# Patient Record
Sex: Female | Born: 1955
Health system: Southern US, Community
[De-identification: ages and names within clinical notes are randomized; demographics above are authoritative.]

## PROBLEM LIST (undated history)

## (undated) DIAGNOSIS — K829 Disease of gallbladder, unspecified: Secondary | ICD-10-CM

## (undated) DIAGNOSIS — F32A Depression, unspecified: Secondary | ICD-10-CM

## (undated) DIAGNOSIS — N83209 Unspecified ovarian cyst, unspecified side: Secondary | ICD-10-CM

## (undated) DIAGNOSIS — G309 Alzheimer's disease, unspecified: Secondary | ICD-10-CM

## (undated) DIAGNOSIS — N83201 Unspecified ovarian cyst, right side: Secondary | ICD-10-CM

## (undated) DIAGNOSIS — IMO0001 Reserved for inherently not codable concepts without codable children: Secondary | ICD-10-CM

## (undated) DIAGNOSIS — I639 Cerebral infarction, unspecified: Secondary | ICD-10-CM

## (undated) DIAGNOSIS — I10 Essential (primary) hypertension: Secondary | ICD-10-CM

## (undated) DIAGNOSIS — F419 Anxiety disorder, unspecified: Secondary | ICD-10-CM

## (undated) DIAGNOSIS — J189 Pneumonia, unspecified organism: Secondary | ICD-10-CM

## (undated) DIAGNOSIS — K760 Fatty (change of) liver, not elsewhere classified: Secondary | ICD-10-CM

## (undated) DIAGNOSIS — K469 Unspecified abdominal hernia without obstruction or gangrene: Secondary | ICD-10-CM

## (undated) DIAGNOSIS — F0280 Dementia in other diseases classified elsewhere without behavioral disturbance: Secondary | ICD-10-CM

## (undated) DIAGNOSIS — I2510 Atherosclerotic heart disease of native coronary artery without angina pectoris: Secondary | ICD-10-CM

## (undated) DIAGNOSIS — F329 Major depressive disorder, single episode, unspecified: Secondary | ICD-10-CM

## (undated) DIAGNOSIS — K769 Liver disease, unspecified: Secondary | ICD-10-CM

## (undated) DIAGNOSIS — J449 Chronic obstructive pulmonary disease, unspecified: Secondary | ICD-10-CM

## (undated) HISTORY — PX: UMBILICAL HERNIA REPAIR: SHX196

## (undated) HISTORY — DX: Chronic obstructive pulmonary disease, unspecified: J44.9

## (undated) HISTORY — DX: Major depressive disorder, single episode, unspecified: F32.9

## (undated) HISTORY — DX: Cerebral infarction, unspecified: I63.9

## (undated) HISTORY — DX: Pneumonia, unspecified organism: J18.9

## (undated) HISTORY — DX: Liver disease, unspecified: K76.9

## (undated) HISTORY — DX: Disease of gallbladder, unspecified: K82.9

## (undated) HISTORY — PX: ABDOMINAL HYSTERECTOMY: SHX81

## (undated) HISTORY — DX: Anxiety disorder, unspecified: F41.9

## (undated) HISTORY — DX: Unspecified ovarian cyst, right side: N83.201

## (undated) HISTORY — PX: OVARIAN CYST REMOVAL: SHX89

## (undated) HISTORY — DX: Fatty (change of) liver, not elsewhere classified: K76.0

## (undated) HISTORY — PX: INGUINAL HERNIA REPAIR: SUR1180

## (undated) HISTORY — DX: Unspecified ovarian cyst, unspecified side: N83.209

## (undated) HISTORY — DX: Depression, unspecified: F32.A

## (undated) HISTORY — PX: CHOLECYSTECTOMY: SHX55

---
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
## 1998-11-04 ENCOUNTER — Ambulatory Visit (HOSPITAL_COMMUNITY): Admission: RE | Admit: 1998-11-04 | Discharge: 1998-11-04 | Payer: Self-pay | Admitting: Obstetrics & Gynecology

## 1998-11-04 ENCOUNTER — Encounter: Payer: Self-pay | Admitting: Obstetrics & Gynecology

## 1998-12-04 ENCOUNTER — Ambulatory Visit (HOSPITAL_COMMUNITY): Admission: RE | Admit: 1998-12-04 | Discharge: 1998-12-04 | Payer: Self-pay | Admitting: Gastroenterology

## 1998-12-04 ENCOUNTER — Encounter: Payer: Self-pay | Admitting: Gastroenterology

## 2000-05-13 ENCOUNTER — Encounter: Payer: Self-pay | Admitting: Emergency Medicine

## 2000-05-13 ENCOUNTER — Emergency Department (HOSPITAL_COMMUNITY): Admission: EM | Admit: 2000-05-13 | Discharge: 2000-05-13 | Payer: Self-pay | Admitting: Emergency Medicine

## 2000-10-25 ENCOUNTER — Encounter: Admission: RE | Admit: 2000-10-25 | Discharge: 2000-10-25 | Payer: Self-pay | Admitting: *Deleted

## 2000-10-25 ENCOUNTER — Encounter: Payer: Self-pay | Admitting: *Deleted

## 2002-09-29 ENCOUNTER — Encounter: Payer: Self-pay | Admitting: Emergency Medicine

## 2002-09-29 ENCOUNTER — Emergency Department (HOSPITAL_COMMUNITY): Admission: EM | Admit: 2002-09-29 | Discharge: 2002-09-29 | Payer: Self-pay | Admitting: Emergency Medicine

## 2002-10-03 ENCOUNTER — Encounter (HOSPITAL_BASED_OUTPATIENT_CLINIC_OR_DEPARTMENT_OTHER): Payer: Self-pay | Admitting: General Surgery

## 2002-10-04 ENCOUNTER — Encounter (HOSPITAL_BASED_OUTPATIENT_CLINIC_OR_DEPARTMENT_OTHER): Payer: Self-pay | Admitting: General Surgery

## 2002-10-04 ENCOUNTER — Ambulatory Visit (HOSPITAL_COMMUNITY): Admission: RE | Admit: 2002-10-04 | Discharge: 2002-10-05 | Payer: Self-pay | Admitting: General Surgery

## 2002-10-04 ENCOUNTER — Encounter (INDEPENDENT_AMBULATORY_CARE_PROVIDER_SITE_OTHER): Payer: Self-pay

## 2005-03-21 DIAGNOSIS — IMO0001 Reserved for inherently not codable concepts without codable children: Secondary | ICD-10-CM

## 2005-03-21 HISTORY — DX: Reserved for inherently not codable concepts without codable children: IMO0001

## 2005-09-23 ENCOUNTER — Emergency Department (HOSPITAL_COMMUNITY): Admission: EM | Admit: 2005-09-23 | Discharge: 2005-09-23 | Payer: Self-pay | Admitting: Emergency Medicine

## 2005-09-29 ENCOUNTER — Encounter: Admission: RE | Admit: 2005-09-29 | Discharge: 2005-09-29 | Payer: Self-pay | Admitting: Cardiovascular Disease

## 2005-10-18 ENCOUNTER — Encounter (HOSPITAL_COMMUNITY): Admission: RE | Admit: 2005-10-18 | Discharge: 2005-12-01 | Payer: Self-pay | Admitting: Cardiovascular Disease

## 2007-02-12 ENCOUNTER — Inpatient Hospital Stay (HOSPITAL_BASED_OUTPATIENT_CLINIC_OR_DEPARTMENT_OTHER): Admission: RE | Admit: 2007-02-12 | Discharge: 2007-02-12 | Payer: Self-pay | Admitting: Cardiology

## 2009-06-22 ENCOUNTER — Ambulatory Visit: Payer: Self-pay | Admitting: Occupational Medicine

## 2009-06-22 DIAGNOSIS — R0602 Shortness of breath: Secondary | ICD-10-CM | POA: Insufficient documentation

## 2009-06-24 ENCOUNTER — Ambulatory Visit: Payer: Self-pay | Admitting: Family Medicine

## 2009-06-24 DIAGNOSIS — I10 Essential (primary) hypertension: Secondary | ICD-10-CM | POA: Insufficient documentation

## 2009-06-24 DIAGNOSIS — J209 Acute bronchitis, unspecified: Secondary | ICD-10-CM | POA: Insufficient documentation

## 2009-06-25 ENCOUNTER — Encounter: Admission: RE | Admit: 2009-06-25 | Discharge: 2009-06-25 | Payer: Self-pay | Admitting: Cardiovascular Disease

## 2009-06-26 ENCOUNTER — Emergency Department (HOSPITAL_COMMUNITY): Admission: EM | Admit: 2009-06-26 | Discharge: 2009-06-26 | Payer: Self-pay | Admitting: Emergency Medicine

## 2009-07-01 ENCOUNTER — Ambulatory Visit: Payer: Self-pay | Admitting: Family Medicine

## 2009-07-01 DIAGNOSIS — F17200 Nicotine dependence, unspecified, uncomplicated: Secondary | ICD-10-CM | POA: Insufficient documentation

## 2009-07-06 ENCOUNTER — Encounter: Payer: Self-pay | Admitting: Family Medicine

## 2009-08-19 ENCOUNTER — Inpatient Hospital Stay (HOSPITAL_COMMUNITY)
Admission: RE | Admit: 2009-08-19 | Discharge: 2009-08-22 | Payer: Self-pay | Source: Home / Self Care | Admitting: General Surgery

## 2009-09-02 ENCOUNTER — Encounter: Payer: Self-pay | Admitting: Family Medicine

## 2010-04-22 NOTE — Assessment & Plan Note (Signed)
Summary: POSSIBLE BRONCHITIS/FEVER  FOR 3 DAYS   Vital Signs:  Patient Profile:   55 Years Old Female CC:      Cough, fever x 3 days Height:     62 inches Weight:      132 pounds O2 Sat:      95 % O2 treatment:    Room Air Temp:     97.4 degrees F oral Pulse rate:   79 / minute Pulse rhythm:   regular Resp:     12 per minute BP sitting:   148 / 87  (right arm) Cuff size:   regular  Vitals Entered By: Emilio Math (June 22, 2009 9:16 AM)                  Current Allergies: ! AMOXICILLIN ! SULFAHistory of Present Illness Chief Complaint: Cough, fever x 3 days History of Present Illness: Presents with complaints of dry cough for the last 3 days.   Much difficulty sleeping due to cough.   Notes some wheezing and chest tightness as well.   Smokes 1ppd for the last 35 years.  Says she has felt warm and might have had a fever.   No sinus congestion.   No post nasal drip.  No ear pain.  No sore throat.   Denies history of asthma or COPD.    REVIEW OF SYSTEMS Constitutional Symptoms      Denies fever, chills, night sweats, weight loss, weight gain, and fatigue.  Eyes       Denies change in vision, eye pain, eye discharge, glasses, contact lenses, and eye surgery. Ear/Nose/Throat/Mouth       Denies hearing loss/aids, change in hearing, ear pain, ear discharge, dizziness, frequent runny nose, frequent nose bleeds, sinus problems, sore throat, hoarseness, and tooth pain or bleeding.  Respiratory       Complains of dry cough, wheezing, and shortness of breath.      Denies productive cough, asthma, bronchitis, and emphysema/COPD.  Cardiovascular       Denies murmurs, chest pain, and tires easily with exhertion.    Gastrointestinal       Denies stomach pain, nausea/vomiting, diarrhea, constipation, blood in bowel movements, and indigestion. Genitourniary       Denies painful urination, kidney stones, and loss of urinary control. Neurological       Denies paralysis, seizures, and  fainting/blackouts. Musculoskeletal       Denies muscle pain, joint pain, joint stiffness, decreased range of motion, redness, swelling, muscle weakness, and gout.  Skin       Denies bruising, unusual mles/lumps or sores, and hair/skin or nail changes.  Psych       Denies mood changes, temper/anger issues, anxiety/stress, speech problems, depression, and sleep problems.  Past History:  Past Medical History: Unremarkable  Past Surgical History: Caesarean section Cholecystectomy GYN surgery Hysterectomy  Family History: Mother, Breast CA, stroke, COPD, Alzheimers Father,D, 4th MI  Social History: 1 ppd, 32 yrs ETOH-yes No Drugs Disability Physical Exam General appearance: well developed, well nourished, no acute distress Ears: normal, no lesions or deformities Nasal: mucosa pink, nonedematous, no septal deviation, turbinates normal Oral/Pharynx: tongue normal, posterior pharynx without erythema or exudate Neck: neck supple,  trachea midline, no masses Chest/Lungs: scattered wheezes throughout all lobes Heart: regular rate and  rhythm, no murmur Abdomen: soft, non-tender without obvious organomegaly Assessment New Problems: 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  Max 12 puffs/day  #1 MDI x 0, 06/22/2009, Kathrine Haddock MD PREDNISONE 10 MG TABS (PREDNISONE) 2 PO BID for 2 days, then 1 BID for 2 days, then 1 daily for 2 days.  Take PC  #14 x 0, 06/22/2009, Kathrine Haddock MD  New Orders: New Patient Level III [phone] Planning Comments:   Planning on quitting smoking Nebulizer combivent given in the clinic Pro air as needed Follow up with Dr. Linford Arnold or Bowen in the next week.      The patient and/or caregiver has been counseled thoroughly with regard to medications prescribed including dosage, schedule, interactions, rationale for use, and possible  side effects and they verbalize understanding.  Diagnoses and expected course of recovery discussed and will return if not improved as expected or if the condition worsens. Patient and/or caregiver verbalized understanding.  Prescriptions: PROAIR HFA 108 (90 BASE) MCG/ACT AERS (ALBUTEROL SULFATE) Two inhalations q4-6hr as needed.  Max 12 puffs/day  #1 MDI x 0   Entered and Authorized by:   Kathrine Haddock MD   Signed by:   Kathrine Haddock MD on 06/22/2009   Method used:   Print then Give to Patient   RxID:   [id number] PREDNISONE 10 MG TABS (PREDNISONE) 2 PO BID for 2 days, then 1 BID for 2 days, then 1 daily for 2 days.  Take PC  #14 x 0   Entered and Authorized by:   Kathrine Haddock MD   Signed by:   Kathrine Haddock MD on 06/22/2009   Method used:   Print then Give to Patient   RxID:   [id number]   Appended Document: POSSIBLE BRONCHITIS/FEVER  FOR 3 DAYS Medication Administration: Ipratropium Bromide 0.5 #[id number] Apr 2011 M25 [employer]  Albuterol Silfate 2.5 mg #[id number] Aug 2012 M30 [employer]

## 2010-04-22 NOTE — Assessment & Plan Note (Signed)
Summary: NOV: Dyspnea, cough, smoker   Vital Signs:  Patient profile:   55 year old female Height:      62.5 inches Weight:      124 pounds BMI:     22.40 O2 Sat:      95 % on Room air Temp:     98.5 degrees F oral Pulse rate:   91 / minute BP sitting:   151 / 94  (left arm) Cuff size:   regular  Vitals Entered By: [name] (June 24, 2009 10:37 AM)  O2 Flow:  Room air CC: Np- seen in UC dx with possible COPD and told to f/u with you. Pt states doesn't feel any better still gets SOB- on prednisone 10mg  and ProAir inhaler Is Patient Diabetic? No   CC:  Np- seen in UC dx with possible COPD and told to f/u with you. Pt states doesn't feel any better still gets SOB- on prednisone 10mg  and ProAir inhaler.  History of Present Illness: Np- seen in UC dx with possible COPD and told to f/u with you. Pt states doesn't feel any better still gets SOB- on prednisone 10mg  and ProAir inhaler.Brittany [name] Has had a cough for several months. Thinks has been exposed to mold.  Cough started getting worse the last few days. She is current smoker.  Would get bronchitis yearly.  Put on the prednisone 10mg f or 5 days. Feels like having fever and chills.  Having coughing fits.  Feels the proair 2 puffs is helping some.  Did receive a breathing treatment.    Does take a blood pressure pill but can't remember the name of it.   Habits & Providers  Alcohol-Tobacco-Diet     Alcohol drinks/day: <1     Tobacco Status: current     Cigarette Packs/Day: 1.0     Year Started: 35 years ago  Exercise-Depression-Behavior     Does Patient Exercise: no     Have you felt down or hopeless? no     STD Risk: never     Drug Use: never     Seat Belt Use: always  Current Medications (verified): 1)  Advil 200 Mg Tabs (Ibuprofen) .... 2 Prn 2)  Prednisone 10 Mg Tabs (Prednisone) .... 2 Po Bid For 2 Days, Then 1 Bid For 2 Days, Then 1 Daily For 2 Days.  Take Pc 3)  Proair Hfa 108 (90 Base) Mcg/act Aers (Albuterol Sulfate) ....  Two Inhalations Q4-6hr As Needed.  Max 12 Puffs/day 4)  Nitrostat 0.3 Mg Subl (Nitroglycerin)  Allergies (verified): 1)  ! Amoxicillin 2)  ! Sulfa  Comments:  Nurse/Medical Assistant: The patient's medications and allergies were reviewed with the patient and were updated in the Medication and Allergy Lists. [name] (June 24, 2009 10:40 AM)  Past History:  Past Surgical History: Caesarean section 1991, 1992  Curretage for endometriosis fin 1993, 94, 95, 96. Cholecystectomy 2004 GYN surgery Hysterectomy Ovarin cyst 1997 Hernia  Social History: Disabled.  1 year of college. Lives with her husband and daughter adn grandchild.  1 ppd, 32 yrs ETOH-yes No Drugs DisabilitySmoking Status:  current Packs/Day:  1.0 Does Patient Exercise:  no STD Risk:  never Drug Use:  never Seat Belt Use:  always  Review of Systems       No fever/sweats. + weakness, no unexplained weight loss/gain.  No vison changes.  No difficulty hearing/ringing in ears, hay fever/allergies.  + chest pain/discomfort, no palpitations.  No Br lump/nipple discharge.  +  cough/wheeze.  No blood in BM, nausea/vomiting/diarrhea.  No nighttime urination, leaking urine, unusual vaginal bleeding, discharge (penis or vagina).  No muscle/joint pain. No rash, change in mole.  No HA, memory loss.  No anxiety, sleep d/o, depression.  No easy bruising/bleeding, unexplained lump   Physical Exam  General:  Well-developed,well-nourished,in no acute distress; alert,appropriate and cooperative throughout examination Head:  Normocephalic and atraumatic without obvious abnormalities. No apparent alopecia or balding. Eyes:  No corneal or conjunctival inflammation noted. EOMI. Perrla.  Ears:  External ear exam shows no significant lesions or deformities.  Otoscopic examination reveals clear canals, tympanic membranes are intact bilaterally without bulging, retraction, inflammation or discharge. Hearing is grossly normal  bilaterally. Nose:  External nasal examination shows no deformity or inflammation. Mouth:  Oral mucosa and oropharynx without lesions or exudates.  Teeth in good repair. Neck:  No deformities, masses, or tenderness noted. No Tm.  Lungs:  Coarse breath sounds with rhonchi diffusely.  No wheezing.   Heart:  Normal rate and regular rhythm. S1 and S2 normal without gallop, murmur, click, rub or other extra sounds. Skin:  no rashes.   Cervical Nodes:  No lymphadenopathy noted Psych:  Cognition and judgment appear intact. Alert and cooperative with normal attention span and concentration. No apparent delusions, illusions, hallucinations   Impression & Recommendations:  Problem # 1:  BRONCHITIS, ACUTE (ICD-466.0) I do really suspect she has underlying COPD so will treat with doxy.Given neb here in th eoffice today and felt much better after the 5mg  of albuterol. Her lung exam improved somewhat.   She is a 1ppd smoker and will increase her proair number of puffs and have her use it consistantly three times a day Will also extend her steroid course and have her f/u in one week. If not better or gets worse will get a CXR for fuether evaluation.  Her updated medication list for this problem includes:    Proair Hfa 108 (90 Base) Mcg/act Aers (Albuterol sulfate) .Brittany [name]..Brittany [name] Two inhalations q4-6hr as needed.  max 12 puffs/day    Doxycycline Hyclate 100 Mg Caps (Doxycycline hyclate) .Brittany [name]... Take 1 tablet by mouth two times a day for 10 days  Problem # 2:  IDYSPNEA (ICD-786.05) I suspect moderate COPD.  once better will schedule her for spirometry to further evaluate her lung funciton.   Problem # 3:  HYPERTENSION, BENIGN (ICD-401.1) She is on a blood pressure medication but can't remeber the name. She has been out of it for a cuple of days but is seeing her cardiologist tomorrow and for refills.    Complete Medication List: 1)  Advil 200 Mg Tabs (Ibuprofen) .... 2 prn 2)  Prednisone 10 Mg Tabs (Prednisone) ....  4 tabs daily for 4 days, then 2 tabs daily for 4 days, then 1 tab daily for 4 days the stop. 3)  Proair Hfa 108 (90 Base) Mcg/act Aers (Albuterol sulfate) .... Two inhalations q4-6hr as needed.  max 12 puffs/day 4)  Nitrostat 0.3 Mg Subl (Nitroglycerin) 5)  Doxycycline Hyclate 100 Mg Caps (Doxycycline hyclate) .... Take 1 tablet by mouth two times a day for 10 days  Other Orders: Albuterol Sulfate Sol 1mg  unit dose (O1308) Nebulizer Tx (65784)  Patient Instructions: 1)  Complete teh antibiotic 2)  Increase your proair to 4-6 puffs every three times a day. If starts feeling better can drop back to 2-4 puffs. 3)  Follow up in one week to recheck lungs. 4)  I am going to extend your steroids for  a little longer.  Prescriptions: DOXYCYCLINE HYCLATE 100 MG CAPS (DOXYCYCLINE HYCLATE) Take 1 tablet by mouth two times a day for 10 days  #20 x 0   Entered and Authorized by:   Nani Gasser MD   Signed by:   Nani Gasser MD on 06/24/2009   Method used:   [facility]ron[facility] to        [facility] [phone]* (retail)       [address] [place], [place]  [address]       Ph: [phone] or [phone]       Fax: [phone]   RxID:   [id number] PREDNISONE 10 MG TABS (PREDNISONE) 4 tabs daily for 4 days, then 2 tabs daily for 4 days, then 1 tab daily for 4 days the stop.  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 If he doesn't want to rx then asked pt to remind me at next OV next week and we can discuss further treatments for smoking cessation.

## 2010-04-22 NOTE — Letter (Signed)
Summary: [facility]   Imported By: Lanelle Bal 09/23/2009 09:54:51  _____________________________________________________________________  External Attachment:    Type:   Image     Comment:   External Document

## 2010-04-22 NOTE — Assessment & Plan Note (Signed)
Summary: Acute bronchitis, tob abuse.    Vital Signs:  Patient profile:   55 year old female Height:      62.5 inches Weight:      126 pounds O2 Sat:      98 % on Room air Pulse rate:   90 / minute BP sitting:   135 / 83  (left arm) Cuff size:   regular  Vitals Entered By: [name] (July 01, 2009 9:02 AM)  O2 Flow:  Room air CC: followup breathing- seen in ED this past Friday for SOB and vomiting- xray showed bronchitis, Headache   Primary Care Provider:  Nani Gasser MD  CC:  followup breathing- seen in ED this past Friday for SOB and vomiting- xray showed bronchitis and Headache.  History of Present Illness: followup breathing- seen in ED this past Friday for SOB and vomiting- xray showed bronchitis.  Saw her cardiologist adn he did an xray and was told had bronchitis.  Later that night started vomiting, coughing and got SOB and then went to the ED.  Did an hour long breathing tx. Stopped the doxy and was changed to zpack. Overall she is feeling better. Would like to start Chantix.    Current Medications (verified): 1)  Advil 200 Mg Tabs (Ibuprofen) .... 2 Prn 2)  Prednisone 10 Mg Tabs (Prednisone) .... 4 Tabs Daily For 4 Days, Then 2 Tabs Daily For 4 Days, Then 1 Tab Daily For 4 Days The Stop. 3)  Proair Hfa 108 (90 Base) Mcg/act Aers (Albuterol Sulfate) .... Two Inhalations Q4-6hr As Needed.  Max 12 Puffs/day 4)  Nitrostat 0.3 Mg Subl (Nitroglycerin) 5)  Amlodipine Besylate 5 Mg Tabs (Amlodipine Besylate) .... Take One Tbaelt By Mouth Once A Day 6)  Tylenol With Codeine #3 300-30 Mg Tabs (Acetaminophen-Codeine) .... Take One Tabelt By Mouth  At Bedtime 7)  Azithromycin 250 Mg Tabs (Azithromycin) .... Take 2 Tablets By Mouth Once A Day Then 1 Tab Daily For 4 Days 8)  Benicar 20 Mg Tabs (Olmesartan Medoxomil) .... Take One Tablet By Mouth Once Ad Ay  Allergies (verified): 1)  ! Amoxicillin 2)  ! Sulfa  Comments:  Nurse/Medical Assistant: The patient's  medications and allergies were reviewed with the patient and were updated in the Medication and Allergy Lists. [name] (July 01, 2009 9:04 AM)  Physical Exam  General:  Well-developed,well-nourished,in no acute distress; alert,appropriate and cooperative throughout examination Head:  Normocephalic and atraumatic without obvious abnormalities. No apparent alopecia or balding. Eyes:  No corneal or conjunctival inflammation noted. EOMI. Perrla. Lungs:  Normal respiratory effort, chest expands symmetrically. Lungs are clear to auscultation, no crackles or wheezes. Heart:  Normal rate and regular rhythm. S1 and S2 normal without gallop, murmur, click, rub or other extra sounds.   Impression & Recommendations:  Problem # 1:  BRONCHITIS, ACUTE (ICD-466.0) Has completed the zpack. Doing much better overall. Using proair about twice a day.    Still recommend f/u for spirometry in a few weeks.  WAs given aerochamber.  The following medications were removed from the medication list:    Doxycycline Hyclate 100 Mg Caps (Doxycycline hyclate) .Marland Kitchen... Take 1 tablet by mouth two times a day for 10 days Her updated medication list for this problem includes:    Proair Hfa 108 (90 Base) Mcg/act Aers (Albuterol sulfate) .Marland Kitchen..Marland Kitchen Two inhalations q4-6hr as needed.  max 12 puffs/day    Azithromycin 250 Mg Tabs (Azithromycin) .Marland Kitchen... Take 2 tablets by mouth once a day then  1 tab daily for 4 days  Problem # 2:  TOBACCO ABUSE (ICD-305.1) Discussed medication s.e and potential complication. Dsicussed how it works.  F/U in 3-4 weeks to make sure doing well.  Her updated medication list for this problem includes:    Chantix Starting Month Pak 0.5 Mg X 11 & 1 Mg X 42 Tabs (Varenicline tartrate) .Marland Kitchen... Take as directed.  Complete Medication List: 1)  Advil 200 Mg Tabs (Ibuprofen) .... 2 prn 2)  Prednisone 10 Mg Tabs (Prednisone) .... 4 tabs daily for 4 days, then 2 tabs daily for 4 days, then 1 tab daily for 4 days the  stop. 3)  Proair Hfa 108 (90 Base) Mcg/act Aers (Albuterol sulfate) .... Two inhalations q4-6hr as needed.  max 12 puffs/day 4)  Nitrostat 0.3 Mg Subl (Nitroglycerin) 5)  Amlodipine Besylate 5 Mg Tabs (Amlodipine besylate) .... Take one tbaelt by mouth once a day 6)  Tylenol With Codeine #3 300-30 Mg Tabs (Acetaminophen-codeine) .... Take one tabelt by mouth  at bedtime 7)  Azithromycin 250 Mg Tabs (Azithromycin) .... Take 2 tablets by mouth once a day then 1 tab daily for 4 days 8)  Benicar 20 Mg Tabs (Olmesartan medoxomil) .... Take one tablet by mouth once ad ay 9)  Chantix Starting Month Pak 0.5 Mg X 11 & 1 Mg X 42 Tabs (Varenicline tartrate) .... Take as directed.  Patient Instructions: 1)  Please schedule spirometry when better. Hold your Proair for about 2 days before the test.  Prescriptions: CHANTIX STARTING MONTH PAK 0.5 MG X 11 & 1 MG X 42 TABS (VARENICLINE TARTRATE) Take as directed.  #1 pack x 0   Entered and Authorized by:   Nani Gasser MD   Signed by:   Nani Gasser MD on 07/01/2009   Method used:   Electron[facility] to        [facility] [phone]* (retail)       [address]       [place], [place]  [address]       Ph: [phone] or [phone]       Fax: [phone]   RxID:   [id number]

## 2010-04-22 NOTE — Letter (Signed)
Summary: [facility]   Imported By: Lanelle Bal 07/27/2009 09:44:49  _____________________________________________________________________  External Attachment:    Type:   Image     Comment:   External Document

## 2010-05-07 ENCOUNTER — Encounter: Payer: Self-pay | Admitting: Family Medicine

## 2010-05-07 ENCOUNTER — Ambulatory Visit (INDEPENDENT_AMBULATORY_CARE_PROVIDER_SITE_OTHER): Payer: Medicare Other | Admitting: Family Medicine

## 2010-05-07 DIAGNOSIS — K5289 Other specified noninfective gastroenteritis and colitis: Secondary | ICD-10-CM | POA: Insufficient documentation

## 2010-05-12 NOTE — Assessment & Plan Note (Signed)
Summary: Gastroenteritis   Vital Signs:  Patient profile:   55 year old female Height:      62.5 inches Weight:      112 pounds Temp:     99.0 degrees F oral Pulse rate:   90 / minute BP sitting:   163 / 97  (right arm) Cuff size:   regular  Vitals Entered By: Avon Gully CMA, Duncan Dull) (May 07, 2010 1:09 PM) CC: vomiting since yesterday   Primary Care Provider:  Nani Gasser MD  CC:  vomiting since yesterday.  History of Present Illness: Started throwing up around 4AM. Has had diarrhea as well.  Today is able to take sips on gingerale.  Feles achey and tired. Husband drive.  HAd some chills.   Current Medications (verified): 1)  Nitrostat 0.3 Mg Subl (Nitroglycerin) 2)  Amlodipine Besylate 5 Mg Tabs (Amlodipine Besylate) .... Take One Tbaelt By Mouth Once A Day 3)  Tylenol With Codeine #3 300-30 Mg Tabs (Acetaminophen-Codeine) .... Take One Tabelt By Mouth  At Bedtime 4)  Benicar 20 Mg Tabs (Olmesartan Medoxomil) .... Take One Tablet By Mouth Once Ad Ay  Allergies (verified): 1)  ! Amoxicillin 2)  ! Sulfa  Comments:  Nurse/Medical Assistant: The patient's medications and allergies were reviewed with the patient and were updated in the Medication and Allergy Lists. Avon Gully CMA, Duncan Dull) (May 07, 2010 1:11 PM)  Physical Exam  General:  Well-developed,well-nourished,in no acute distress; alert,appropriate and cooperative throughout examination Mouth:  Oral mucosa and oropharynx without lesions or exudates.  Teeth in good repair. Lungs:  Normal respiratory effort, chest expands symmetrically. Lungs are clear to auscultation, no crackles or wheezes. Heart:  Normal rate and regular rhythm. S1 and S2 normal without gallop, murmur, click, rub or other extra sounds. Abdomen:  Bowel sounds positive,abdomen soft and non-tender without masses, organomegaly or hernias noted. Skin:  no rashes.   Psych:  Cognition and judgment appear intact. Alert and  cooperative with normal attention span and concentration. No apparent delusions, illusions, hallucinations   Impression & Recommendations:  Problem # 1:  GASTROENTERITIS (ICD-558.9)  Likely norovirus.  Shoulder be better in the next 24 hours. Will send over anti-nausea meds.  Give H.O. Phenergan injection given for acut enausea relief. Her husband is driving. Work on [employer].  Her updated medication list for this problem includes:    Zofran 8 Mg Tabs (Ondansetron hcl) ..... One -half to one tab sulblingually every 6 hours as needed nausea.  Orders: Promethazine up to 50mg  (J2550) Admin of Injection (IM/SQ) (98119)  Complete Medication List: 1)  Nitrostat 0.3 Mg Subl (Nitroglycerin) 2)  Amlodipine Besylate 5 Mg Tabs (Amlodipine besylate) .... Take one tbaelt by mouth once a day 3)  Tylenol With Codeine #3 300-30 Mg Tabs (Acetaminophen-codeine) .... Take one tabelt by mouth  at bedtime 4)  Benicar 20 Mg Tabs (Olmesartan medoxomil) .... Take one tablet by mouth once ad ay 5)  Zofran 8 Mg Tabs (Ondansetron hcl) .... One -half to one tab sulblingually every 6 hours as needed nausea. 6)  Promethegan 25 Mg Supp (Promethazine hcl) .... One per rectum every 6 hours as needed nausea.  Patient Instructions: 1)  Phenergan injection given for acut enausea relief. Her husband is driving.  Prescriptions: PROMETHEGAN 25 MG SUPP (PROMETHAZINE HCL) one per rectum every 6 hours as needed nausea.  #8 x 0   Entered and Authorized by:   Nani Gasser MD   Signed by:   Nani Gasser MD on  05/07/2010   Method used:   [facility]ronically to        [facility] [phone]* (retail)       [address]       [place], [place]  [address]       Ph: [phone] or [phone]       Fax: [phone]   RxID:   [id number] ZOFRAN 8 MG TABS (ONDANSETRON HCL) one -half to one tab sulblingually every 6 hours as needed nausea.  #8 x 0   Entered and Authorized by:    Nani Gasser MD   Signed by:   Nani Gasser MD on 05/07/2010   Method used:   [facility] to        [facility] [phone]* (retail)       [address]       [place], [place]  [address]       Ph: [phone] or [phone]       Fax: [phone]   RxID:   [id number]    Medication Administration  Injection # 1:    Medication: Promethazine up to 50mg     Diagnosis: GASTROENTERITIS (ICD-558.9)    Route: IM    Site: LUOQ gluteus    Exp Date: 10/20/2011    Lot #: [id number]    Mfr: [place]ward    Patient tolerated injection without complications    Given by: Avon Gully CMA, Duncan Dull) (May 07, 2010 2:08 PM)  Orders Added: 1)  Promethazine up to 50mg  [J2550] 2)  Admin of Injection (IM/SQ) [85462] 3)  Est. Patient Level III [70350]

## 2010-06-07 LAB — BASIC METABOLIC PANEL
Calcium: 8.9 mg/dL (ref 8.4–10.5)
Chloride: 107 mEq/L (ref 96–112)
GFR calc Af Amer: >60 mL/min (ref >60)
GFR calc non Af Amer: >60 mL/min (ref >60)

## 2010-06-09 LAB — BASIC METABOLIC PANEL
BUN: 12 mg/dL (ref 6–23)
Chloride: 103 mEq/L (ref 96–112)
Creatinine, Ser: 0.74 mg/dL (ref 0.4–1.2)
Glucose, Bld: 114 mg/dL — ABNORMAL HIGH (ref 70–99)
Potassium: 3.5 mEq/L (ref 3.5–5.1)

## 2010-06-09 LAB — DIFFERENTIAL
Basophils Relative: 1 % (ref 0–1)
Eosinophils Absolute: 0 10*3/uL (ref 0.0–0.7)
Monocytes Absolute: 0.9 10*3/uL (ref 0.1–1.0)
Neutro Abs: 7.2 10*3/uL (ref 1.7–7.7)
Neutrophils Relative %: 68 % (ref 43–77)

## 2010-06-09 LAB — CBC
Hemoglobin: 16.5 g/dL — ABNORMAL HIGH (ref 12.0–15.0)
MCV: 92.6 fL (ref 78.0–100.0)
Platelets: 249 10*3/uL (ref 150–400)
RBC: 5.26 MIL/uL — ABNORMAL HIGH (ref 3.87–5.11)

## 2010-08-03 NOTE — Op Note (Signed)
Brittany [name], Brittany [name]                ACCOUNT NO.:  [id number]   MEDICAL RECORD NO.:  [id number]          PATIENT TYPE:  OIB   LOCATION:  NA                           FACILITY:  [facility]   PHYSICIAN:  Mohan N. Sharyn Lull, M.D. DATE OF BIRTH:  [birth year]   DATE OF PROCEDURE:  02/12/2007  DATE OF DISCHARGE:                               OPERATIVE REPORT   PROCEDURE:  Left cardiac catheterization with selective left and right  coronary angiography, left ventriculography via right groin using  Judkins technique.   INDICATIONS FOR PROCEDURE:  [name] [name] is 55 year old white female with  past medical history significant for hypertension, hypercholesteremia,  tobacco abuse, positive family history of coronary artery disease,  chronic pain syndrome.  Complains of retrosternal chest tightness  described as squeezing pain radiating to the jaw and neck while washing  dishes.  She took sublingual nitro with relief.  She called EMS and went  to [facility] and was admitted and was advised for CT  angiogram, but the patient signed out AMA.  The patient gives also  history of exertional chest pain and dyspnea, relieved with rest.  Denies any palpitation, lightheadedness or syncope.  Denies PND,  orthopnea, leg swelling.  States had stress test in the past which was  negative.   PAST MEDICAL HISTORY:  As above.   PAST SURGICAL HISTORY:  She had C-section in the past, ovarian cyst  resection in the past, had cholecystectomy in the past and endometrial  surgery in the past.   ALLERGIES:  SHE IS ALLERGIC TO SULFA AND AMOXICILLIN.   MEDICATION AT HOME:  She is on:  1. Azor 5/20 p.o. daily.  2. Toprol XL 50 mg p.o. daily.  3. Crestor 5 mg daily.  4. Enteric-coated aspirin 81 mg p.o. daily.   SOCIAL HISTORY:  She is married and has one child.  Smoked more than 1  pack per day for 30+ years and now smokes 13 cigarettes per day.  Drinks  beer and wine occasionally socially.  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   FAMILY HISTORY:  Father died of MI in his 28s.  Mother is alive; she is  hypertensive; she had stroke.  Two sisters in good health.   On examination, she is alert, awake and oriented x3 in no acute  distress.  Blood pressure was 140/82, pulse was 85 regular.  Conjunctivae was pink.  Neck supple.  No JVD.  No bruit.  LUNGS:  Were clear to auscultation without rhonchi or rales.  CARDIOVASCULAR EXAM:  S1/S2 was normal.  There was a soft systolic  murmur.  There was no S3 gallop.  ABDOMEN:  Was soft.  Bowel sounds were present, nontender.  EXTREMITIES:  There was no clubbing, cyanosis or edema.   IMPRESSION:  Was recurrent chest pain worrisome for angina, rule out  coronary insufficiency, hypertension, hypercholesteremia, positive  family history of coronary artery disease, tobacco abuse.  Discussed  with the patient regarding noninvasive repeat stress testing versus left  catheterization - its risks and benefits, i.e. death, stroke, need for  emergency CABG, risk of restenosis, local vascular complications, etc.,  and consented for left catheterization.   PROCEDURE:  After obtaining informed consent, the patient was brought to  the catheterization lab and was placed on fluoroscopy table.  Right  groin was prepped and draped in usual fashion.  Two percent Xylocaine  was used for local anesthesia in the right groin.  With the help of a  thin-walled needle, 4-French arterial sheath was placed.  Sheath was  aspirated and flushed.  Next, 4-French left Judkins catheter was  advanced over the wire under fluoroscopic guidance up to the ascending  aorta.  Wire was pulled out.  The catheter was aspirated and connected  to the manifold.  Catheter was further advanced and engaged into left  coronary ostium.  Multiple views of the left system were taken.  Next,  the catheter was disengaged and was pulled out over the wire and was  replaced with a 4-French right Judkins  catheter which was advanced over  the wire under fluoroscopic guidance to the ascending aorta.  Wire was  pulled out.  The catheter was aspirated and connected to the manifold.  Catheter was further advanced and engaged into right coronary ostium.  Multiple views of the right system were taken.  Next, the catheter was  disengaged and was pulled out over the wire and was replaced with 4-  [place] pigtail catheter which was advanced over the wire under  fluoroscopic guidance up to the ascending aorta.  Wire was pulled out,  and the catheter was aspirated and connected to the manifold.  Catheter  was further advanced across the aortic valve into the LV.  LV pressures  were recorded.  Next, left ventriculography was done in 30-degree RAO  position.  Post-angiographic pressures were recorded from LV, and then  pullback pressures were recorded from the aorta.  There was no gradient  across the aortic valve.  Next, the pigtail catheter was pulled out over  the wire.  Sheaths were aspirated and flushed.   FINDINGS:  Left ventricle showed good LV systolic function, mild left  ventricular hypertrophy, left ventricular ejection fraction of 50-55%.  Left main was patent.  Left anterior descending artery has 15-20% mid  stenosis.  Diagonal 1 is patent.  Diagonal 2 is very small.  Ramus is  very, very small.  Left circumflex has 5-10% stenosis.  OM1 and OM-2  were small which were patent.  RCA was patent.  The patient tolerated  the procedure well.  There were no complications.  The patient was  transferred to recovery room in stable condition.      Eduardo Osier. Sharyn Lull, M.D.  Electronically Signed     MNH/MEDQ  D:  02/12/2007  T:  02/12/2007  Job:  [id number]   cc:   Ricki Rodriguez, M.D.  Eduardo Osier. Sharyn Lull, M.D.  [facility]

## 2010-08-06 NOTE — Op Note (Signed)
NAME:  Brittany [name], Brittany [name]                          ACCOUNT NO.:  [id number]   MEDICAL RECORD NO.:  [id number]                   PATIENT TYPE:  OIB   LOCATION:  5729                                 FACILITY:  [facility]   PHYSICIAN:  Leonie Man, M.D.                DATE OF BIRTH:  December 11, 1955   DATE OF PROCEDURE:  10/04/2002  DATE OF DISCHARGE:  10/05/2002                                 OPERATIVE REPORT   PREOPERATIVE DIAGNOSIS:  Chronic calculus cholecystitis.   POSTOPERATIVE DIAGNOSIS:  Chronic calculus cholecystitis.   PROCEDURE:  Laparoscopic cholecystectomy with intraoperative cholangiogram.   SURGEON:  Leonie Man, M.D.   ASSISTANT:  Magnus Ivan, R.N. Lenise Arena.   ANESTHESIA:  General.   INDICATIONS:  The patient is a 55 year old woman presenting with acute onset  of right upper quadrant and epigastric pain associated with nausea and  vomiting.  On evaluation, she is noted to have a shrunken gallbladder with  multiple stones.  This was seen both on CT scans and on ultrasound.  There  is no evidence of ductal dilatation or ductal obstruction.  Liver function  studies all have been within normal limits, and there is no elevation of her  serum lipase.  She has had multiple previous abdominal operations and is, in  fact, disabled, because of chronic lower abdominal pain due to adhesions.  She comes to the operating room now after the risks and potential benefits  of surgery have been fully discussed, all questions answered and consent  obtained.   DESCRIPTION OF PROCEDURE:  Following the induction of satisfactory general  anesthesia, the patient was positioned supinely, and the abdomen routinely  prepped and draped to be included in a sterile operative field.  A  curvilinear incision was carried down above the umbilicus following the line  of previous suture line.  This was deepened through the skin and  subcutaneous tissues and carried down to the linea alba.  The linea alba  was  grasped and opened gently.  A Hasson trocar was placed in the wound and  insufflation into the peritoneal cavity carried out to 14 mmHg pressure.  The laparoscope was inserted and thorough exploration of the abdomen carried  out.  The liver appeared mildly enlarged but not excessively so.  The liver  edges were sharp.  The liver surface was smooth.  The gallbladder was  chronically scarred and contracted.  The anterior gastric wall and duodenal  sweep appeared to be normal.  There were multiple adhesions in the lower  abdomen and adhesions to the anterior abdominal wall.  Under direct vision,  epigastric and lateral ports were placed, and the gallbladder was grasped  and retracted cephalad.  Dissection was carried down in the region of the  ampulla and the hepatoduodenal ligament with isolation of the cystic artery  and cystic duct.  The cystic artery was traced into its entry into the  gallbladder wall and clipped and transected.  The cystic duct was traced to  the gallbladder junction and it was doubly clipped and opened.  The cystic  duct cholangiogram was carried out by passing a Reddick catheter into the  abdomen through a 14 gauge angiocatheter and placing this in the cystic  duct.  I injected 1/2 strength Omnipaque contrast into the biliary system.  The resulting fluoroscopic cholangiogram showed free flow of contrast into  the duodenum and up into the upper hepatic radicles.  There were no filling  defects.  The caliber of the bile ducts appeared to be normal.  The  cholangiocatheter was then removed and the cystic duct was doubly clipped  and then transected.  The gallbladder was then dissected free from the liver  bed using electrocautery and maintaining hemostasis throughout the entire  course of the dissection with electrocautery.  At the end of the dissection,  the liver bed was then thoroughly inspected.  Additional bleeding points  were treated with electrocautery.  The  peritoneal cavity was then thoroughly  irrigated with sterile saline.  The gallbladder was retrieved through the  umbilical port with a camera in the upper port using an Endo-pouch.  The  camera was then turned toward the pelvis.  There were multiple adhesions of  the abdominal wall which were serially taken down using the dissector.  All  adhesions seen were removed from the anterior abdominal wall.  The  peritoneal cavity was again thoroughly irrigated with normal saline and  aspirated.  Sponge, instrument and sharp counts were verified.  Trocars were  removed under direct vision.  The pneumoperitoneum was allowed to deflate.  The wound was closed in layers as follows.  The umbilical wound in two  layers with 0 Vicryl and 4-0 Monocryl.  The epigastric and lateral ports  were closed with 4-0 Monocryl sutures.  All wounds were then reinforced with  Steri-Strips.  Sterile dressings were applied.  The anesthetic was reversed.  The patient was removed from the operating room to the recovery room in  stable condition.  She tolerated the procedure well.                                               Leonie Man, M.D.    PB/MEDQ  D:  10/04/2002  T:  10/06/2002  Job:  [id number]

## 2010-08-20 ENCOUNTER — Emergency Department (HOSPITAL_COMMUNITY)
Admission: EM | Admit: 2010-08-20 | Discharge: 2010-08-20 | Disposition: A | Discharge status: Home / Self Care | Payer: Medicare Other | Attending: Emergency Medicine | Admitting: Emergency Medicine

## 2010-08-20 DIAGNOSIS — I10 Essential (primary) hypertension: Secondary | ICD-10-CM | POA: Insufficient documentation

## 2010-08-20 DIAGNOSIS — K5289 Other specified noninfective gastroenteritis and colitis: Secondary | ICD-10-CM | POA: Insufficient documentation

## 2010-08-20 DIAGNOSIS — R197 Diarrhea, unspecified: Secondary | ICD-10-CM | POA: Insufficient documentation

## 2010-08-20 DIAGNOSIS — E860 Dehydration: Secondary | ICD-10-CM | POA: Insufficient documentation

## 2010-08-20 DIAGNOSIS — R112 Nausea with vomiting, unspecified: Secondary | ICD-10-CM | POA: Insufficient documentation

## 2010-08-20 DIAGNOSIS — B9789 Other viral agents as the cause of diseases classified elsewhere: Secondary | ICD-10-CM | POA: Insufficient documentation

## 2010-08-20 LAB — BASIC METABOLIC PANEL
BUN: 9 mg/dL (ref 6–23)
GFR calc Af Amer: >60 mL/min (ref >60)

## 2010-08-20 LAB — DIFFERENTIAL
Basophils Relative: 0 % (ref 0–1)
Neutro Abs: 6.6 10*3/uL (ref 1.7–7.7)
Neutrophils Relative %: 70 % (ref 43–77)

## 2010-08-20 LAB — URINALYSIS, ROUTINE W REFLEX MICROSCOPIC
Glucose, UA: NEGATIVE mg/dL
Protein, ur: NEGATIVE mg/dL
Specific Gravity, Urine: 1.02 (ref 1.005–1.030)
Urobilinogen, UA: 2 mg/dL — ABNORMAL HIGH (ref 0.0–1.0)
pH: 7 (ref 5.0–8.0)

## 2010-08-20 LAB — CBC
HCT: 44.1 % (ref 36.0–46.0)
Hemoglobin: 15.6 g/dL — ABNORMAL HIGH (ref 12.0–15.0)
MCH: 32 pg (ref 26.0–34.0)
MCV: 90.6 fL (ref 78.0–100.0)
RBC: 4.87 MIL/uL (ref 3.87–5.11)
RDW: 12.7 % (ref 11.5–15.5)
WBC: 9.4 10*3/uL (ref 4.0–10.5)

## 2010-10-18 ENCOUNTER — Other Ambulatory Visit: Payer: Self-pay | Admitting: Cardiovascular Disease

## 2010-10-18 ENCOUNTER — Ambulatory Visit
Admission: RE | Admit: 2010-10-18 | Discharge: 2010-10-18 | Disposition: A | Discharge status: Home / Self Care | Payer: Medicare Other | Source: Ambulatory Visit | Attending: Cardiovascular Disease | Admitting: Cardiovascular Disease

## 2010-10-18 DIAGNOSIS — R05 Cough: Secondary | ICD-10-CM

## 2010-10-18 DIAGNOSIS — R059 Cough, unspecified: Secondary | ICD-10-CM

## 2011-01-17 ENCOUNTER — Encounter: Payer: Self-pay | Admitting: Family Medicine

## 2011-01-18 ENCOUNTER — Encounter: Payer: Self-pay | Admitting: Family Medicine

## 2011-01-18 ENCOUNTER — Ambulatory Visit (INDEPENDENT_AMBULATORY_CARE_PROVIDER_SITE_OTHER): Payer: Medicare Other | Admitting: Family Medicine

## 2011-01-18 ENCOUNTER — Ambulatory Visit
Admission: RE | Admit: 2011-01-18 | Discharge: 2011-01-18 | Disposition: A | Discharge status: Home / Self Care | Payer: Medicare Other | Source: Ambulatory Visit | Attending: Family Medicine | Admitting: Family Medicine

## 2011-01-18 ENCOUNTER — Other Ambulatory Visit: Payer: Self-pay | Admitting: Family Medicine

## 2011-01-18 VITALS — BP 148/99 | HR 61 | Wt 103.0 lb

## 2011-01-18 DIAGNOSIS — R1012 Left upper quadrant pain: Secondary | ICD-10-CM

## 2011-01-18 LAB — CBC WITH DIFFERENTIAL/PLATELET
Basophils Absolute: 0 10*3/uL (ref 0.0–0.1)
Lymphocytes Relative: 26 % (ref 12–46)
Lymphs Abs: 1.6 10*3/uL (ref 0.7–4.0)
MCV: 94.3 fL (ref 78.0–100.0)
Neutro Abs: 4.2 10*3/uL (ref 1.7–7.7)
Neutrophils Relative %: 68 % (ref 43–77)
Platelets: 200 10*3/uL (ref 150–400)
RBC: 4.76 MIL/uL (ref 3.87–5.11)
RDW: 13.6 % (ref 11.5–15.5)
WBC: 6.2 10*3/uL (ref 4.0–10.5)

## 2011-01-18 NOTE — Progress Notes (Signed)
Subjective:    Patient ID: Brittany [name], female    DOB: [birth year], 55 y.o.   MRN: [id number]  HPI Was picking up cans at church later that night started having pain over her left lower ribs.  Pain Has been there 9 days now. Mildly tender to touch but painful to take a deep breath, turns, twists.  Uses a heating pad and maybe helps for 10 minutes. keeping her awake.  Can't lay on her left side.  Has lost some weight over the last month.  Has been somewhat depressed as she is separating from her husband.  She feels like she has a lot of support her faith. He had an affair. No prior hx of problems with her pancreas.  No SOB. Occ cough, + smoker.  Using some old vicodin and doesn't help. Taking 4 Advil at a time and not helping. Sitting makes it worse. No nausea vomiting or diarrhea. No fever.   Review of Systems  BP 148/99  Pulse 61  Wt 103 lb (46.72 kg)    Allergies  Allergen Reactions  . Amoxicillin   . Sulfonamide Derivatives     No past medical history on file.  Past Surgical History  Procedure Date  . Cesarean section   . Curretage for endometriosis 93, 94,95, 96  . Cholecystectomy   . Gyn surgery   . Abdominal hysterectomy   . Ovarian cyst removal   . Hernia repair     History   Social History  . Marital Status: Divorced    Spouse Name: N/A    Number of Children: N/A  . Years of Education: N/A   Occupational History  . Not on file.   Social History Main Topics  . Smoking status: Current Everyday Smoker -- 1.0 packs/day for 30 years    Types: Cigarettes  . Smokeless tobacco: Not on file  . Alcohol Use: Yes  . Drug Use: No  . Sexually Active:      disabled, `1 yr college, married.    Other Topics Concern  . Not on file   Social History Narrative  . No narrative on file    Family History  Problem Relation Age of Onset  . Cancer Mother   . Stroke Mother   . COPD Mother   . Dementia Mother   . Diabetes Father   . Heart attack Father     Ms.  Daphine Deutscher does not currently have medications on file.     Objective:   Physical Exam  Constitutional: She appears well-developed and well-nourished.  HENT:  Head: Normocephalic and atraumatic.  Neck: No thyromegaly present.  Cardiovascular: Normal rate, regular rhythm and normal heart sounds.   Pulmonary/Chest: Effort normal and breath sounds normal.       She is tender over the left lower anterior ribs.  Abdominal: Soft. Bowel sounds are normal. She exhibits no distension and no mass. There is tenderness. There is no rebound and no guarding.       She is tender over the epigastric area.  Lymphadenopathy:    She has no cervical adenopathy.  Skin: Skin is warm and dry.  Psychiatric: She has a normal mood and affect. Her behavior is normal.          Assessment & Plan:  LUQ pain- she is tender over the ribs. I will get an x-ray today to rule out rib fracture. This certainly her trauma would have been very mild. If she does have a fracture then we  definitely need to check vitamin D levels on her and consider adding a calcium supplement. Also we can consider stronger pain medication and over-the-counter NSAIDs if she does have a fracture. I also want to make sure there is nothing going on for pancreas or GI system weakness she has not had any nausea or vomiting or diarrhea.  Rib pain.

## 2011-01-19 ENCOUNTER — Telehealth: Payer: Self-pay | Admitting: *Deleted

## 2011-01-19 LAB — AMYLASE: Amylase: 18 U/L (ref 0–105)

## 2011-01-19 LAB — COMPLETE METABOLIC PANEL WITH GFR
AST: 14 U/L (ref 0–37)
BUN: 8 mg/dL (ref 6–23)
Calcium: 9.7 mg/dL (ref 8.4–10.5)
Chloride: 106 mEq/L (ref 96–112)
Creat: 0.63 mg/dL (ref 0.50–1.10)
GFR, Est African American: >90 mL/min (ref >90)
GFR, Est Non African American: >90 mL/min (ref >90)
Glucose, Bld: 118 mg/dL — ABNORMAL HIGH (ref 70–99)

## 2011-01-19 NOTE — Telephone Encounter (Signed)
Spoke with pt's son-in-law and gave him results.

## 2011-01-19 NOTE — Telephone Encounter (Signed)
Message copied by Wyline Beady on Wed Jan 19, 2011  2:49 PM ------      Message from: Nani Gasser D      Created: Wed Jan 19, 2011  8:33 AM       Pancreas is normal. Potassium is nl. Blood ct is nl except hgb is elevated form her smoking. I think her pain may be more from a muscle strain over the ribs.  Recommend heat to the area. Aleve or IBU as needed and gentle stretchesl  May take a couple more weeks to heal. If not then let me know.

## 2011-01-19 NOTE — Telephone Encounter (Signed)
Message copied by Wyline Beady on Wed Jan 19, 2011  2:30 PM ------      Message from: Nani Gasser D      Created: Tue Jan 18, 2011  5:37 PM       The x-ray of the ribs was normal. No fracture or fluid on the lungs. We will call her soon as we get her blood work results back.

## 2011-01-19 NOTE — Telephone Encounter (Signed)
LMOM with results

## 2012-02-11 ENCOUNTER — Emergency Department (HOSPITAL_COMMUNITY): Payer: Medicare Other

## 2012-02-11 ENCOUNTER — Emergency Department (HOSPITAL_COMMUNITY)
Admission: EM | Admit: 2012-02-11 | Discharge: 2012-02-11 | Disposition: A | Discharge status: Home / Self Care | Payer: Medicare Other | Attending: Emergency Medicine | Admitting: Emergency Medicine

## 2012-02-11 ENCOUNTER — Encounter (HOSPITAL_COMMUNITY): Payer: Self-pay | Admitting: *Deleted

## 2012-02-11 DIAGNOSIS — I10 Essential (primary) hypertension: Secondary | ICD-10-CM | POA: Insufficient documentation

## 2012-02-11 DIAGNOSIS — Y929 Unspecified place or not applicable: Secondary | ICD-10-CM | POA: Insufficient documentation

## 2012-02-11 DIAGNOSIS — Z9889 Other specified postprocedural states: Secondary | ICD-10-CM | POA: Insufficient documentation

## 2012-02-11 DIAGNOSIS — M25552 Pain in left hip: Secondary | ICD-10-CM

## 2012-02-11 DIAGNOSIS — M25559 Pain in unspecified hip: Secondary | ICD-10-CM | POA: Insufficient documentation

## 2012-02-11 DIAGNOSIS — F17200 Nicotine dependence, unspecified, uncomplicated: Secondary | ICD-10-CM | POA: Insufficient documentation

## 2012-02-11 DIAGNOSIS — Y939 Activity, unspecified: Secondary | ICD-10-CM | POA: Insufficient documentation

## 2012-02-11 DIAGNOSIS — W1789XA Other fall from one level to another, initial encounter: Secondary | ICD-10-CM | POA: Insufficient documentation

## 2012-02-11 HISTORY — DX: Essential (primary) hypertension: I10

## 2012-02-11 MED ORDER — OXYCODONE-ACETAMINOPHEN 5-325 MG PO TABS
1.0000 | ORAL_TABLET | Freq: Once | ORAL | Status: AC
  Administered 2012-02-11: 1 via ORAL
  Filled 2012-02-11: qty 1

## 2012-02-11 MED ORDER — OXYCODONE-ACETAMINOPHEN 5-325 MG PO TABS: 1.0000 | ORAL_TABLET | Freq: Once | ORAL | Status: DC

## 2012-02-11 NOTE — ED Provider Notes (Signed)
History     CSN: [id number]  Arrival date & time 02/11/12  1357   First MD Initiated Contact with Patient 02/11/12 1452      Chief Complaint  Patient presents with  . Groin Pain    left    (Consider location/radiation/quality/duration/timing/severity/associated sxs/prior treatment) Patient is a 56 y.o. female presenting with groin pain. The history is provided by the patient.  Groin Pain This is a new problem. The current episode started yesterday. Pertinent negatives include no abdominal pain, chest pain, fever or neck pain. Associated symptoms comments: She fell backwards yesterday after stepping in a hole, landing in a sitting position. She reports severe pain in left anterior hip that makes weight bearing impossible. No swelling. No other injury..    Past Medical History  Diagnosis Date  . Hypertension     Past Surgical History  Procedure Date  . Cesarean section   . Curretage for endometriosis 93, 94,95, 96  . Cholecystectomy   . Gyn surgery   . Abdominal hysterectomy   . Ovarian cyst removal   . Hernia repair     Family History  Problem Relation Age of Onset  . Cancer Mother   . Stroke Mother   . COPD Mother   . Dementia Mother   . Diabetes Father   . Heart attack Father     History  Substance Use Topics  . Smoking status: Current Every Day Smoker -- 1.0 packs/day for 30 years    Types: Cigarettes  . Smokeless tobacco: Former Neurosurgeon  . Alcohol Use: Yes     Comment: occasional    OB History    Grav Para Term Preterm Abortions TAB SAB Ect Mult Living                  Review of Systems  Constitutional: Negative for fever.  HENT: Negative for neck pain.   Cardiovascular: Negative for chest pain.  Gastrointestinal: Negative for abdominal pain.  Musculoskeletal: Negative for back pain.       See HPI.  Skin: Negative for wound.    Allergies  Amoxicillin and Sulfonamide derivatives  Home Medications   Current Outpatient Rx  Name  Route  Sig   Dispense  Refill  . AZOR PO   Oral   Take by mouth. Pt takes 1/2 tablet, is a sample from the md and unaware of the strength         . NITROGLYCERIN 0.3 MG SL SUBL   Sublingual   Place 0.3 mg under the tongue every 5 (five) minutes as needed.             BP 151/87  Pulse 105  Temp 98.6 F (37 C) (Oral)  Resp 18  SpO2 100%  Physical Exam  Constitutional: She is oriented to person, place, and time. She appears well-developed and well-nourished. No distress.  Musculoskeletal:       Mild tenderness lateral aspect left hip, no deformity. More significant tenderness anterior aspect hip. No rotation deformities. No back tenderness.   Neurological: She is alert and oriented to person, place, and time.  Psychiatric: She has a normal mood and affect.    ED Course  Procedures (including critical care time)  Labs Reviewed - No data to display No results found.  Dg Hip Complete Left  02/11/2012  *RADIOLOGY REPORT*  Clinical Data: Fall with left groin and hip pain.  LEFT HIP - COMPLETE 2+ VIEW  Comparison: None.  Findings: No acute fracture or dislocation  is identified.  No evidence of pelvic fracture.  Soft tissues are unremarkable.  No bony lesions or significant arthropathy.  IMPRESSION: Normal left hip and pelvis.   Original Report Authenticated By: Irish Lack, M.D.    No diagnosis found.  1. Left hip pain 2. Fall   MDM  Negative x-rays. Discussed findings with patient. Refer to ortho as needed for persistent pain.        Rodena Medin, PA-C 02/11/12 1614

## 2012-02-11 NOTE — ED Notes (Signed)
Pt states she "fell in a hole" yesterday.  Pt states she fell backwards when she stepped in the hole.  Pt states pain is so bad in her left groin that she is unable to bend leg or walk without pain.  Pt states walking/standing makes pain worse.  She can tolerate sitting and laying down.  Pt states her left leg feels weak since she fell.  Pt denies hitting head or LOC.

## 2012-02-12 NOTE — ED Provider Notes (Signed)
Medical screening examination/treatment/procedure(s) were performed by non-physician practitioner and as supervising physician I was immediately available for consultation/collaboration.  Tobin Chad, MD 02/12/12 [phone]

## 2012-03-21 DIAGNOSIS — K829 Disease of gallbladder, unspecified: Secondary | ICD-10-CM

## 2012-03-21 HISTORY — DX: Disease of gallbladder, unspecified: K82.9

## 2012-08-02 ENCOUNTER — Encounter (HOSPITAL_COMMUNITY): Payer: Self-pay | Admitting: Emergency Medicine

## 2012-08-02 ENCOUNTER — Emergency Department (HOSPITAL_COMMUNITY)
Admission: EM | Admit: 2012-08-02 | Discharge: 2012-08-02 | Disposition: A | Discharge status: Home / Self Care | Payer: Medicare Other | Attending: Emergency Medicine | Admitting: Emergency Medicine

## 2012-08-02 DIAGNOSIS — R300 Dysuria: Secondary | ICD-10-CM | POA: Insufficient documentation

## 2012-08-02 DIAGNOSIS — F17200 Nicotine dependence, unspecified, uncomplicated: Secondary | ICD-10-CM | POA: Insufficient documentation

## 2012-08-02 DIAGNOSIS — M545 Low back pain, unspecified: Secondary | ICD-10-CM | POA: Insufficient documentation

## 2012-08-02 DIAGNOSIS — Z79899 Other long term (current) drug therapy: Secondary | ICD-10-CM | POA: Insufficient documentation

## 2012-08-02 DIAGNOSIS — M549 Dorsalgia, unspecified: Secondary | ICD-10-CM

## 2012-08-02 DIAGNOSIS — R630 Anorexia: Secondary | ICD-10-CM | POA: Insufficient documentation

## 2012-08-02 DIAGNOSIS — R110 Nausea: Secondary | ICD-10-CM | POA: Insufficient documentation

## 2012-08-02 DIAGNOSIS — G8929 Other chronic pain: Secondary | ICD-10-CM | POA: Insufficient documentation

## 2012-08-02 DIAGNOSIS — I10 Essential (primary) hypertension: Secondary | ICD-10-CM | POA: Insufficient documentation

## 2012-08-02 DIAGNOSIS — F3289 Other specified depressive episodes: Secondary | ICD-10-CM | POA: Insufficient documentation

## 2012-08-02 DIAGNOSIS — R3911 Hesitancy of micturition: Secondary | ICD-10-CM | POA: Insufficient documentation

## 2012-08-02 DIAGNOSIS — F329 Major depressive disorder, single episode, unspecified: Secondary | ICD-10-CM | POA: Insufficient documentation

## 2012-08-02 LAB — CBC WITH DIFFERENTIAL/PLATELET
Basophils Absolute: 0 10*3/uL (ref 0.0–0.1)
Basophils Relative: 0 % (ref 0–1)
Eosinophils Absolute: 0 10*3/uL (ref 0.0–0.7)
HCT: 38.8 % (ref 36.0–46.0)
MCH: 30.2 pg (ref 26.0–34.0)
MCHC: 35.1 g/dL (ref 30.0–36.0)
Monocytes Absolute: 0.3 10*3/uL (ref 0.1–1.0)
Monocytes Relative: 7 % (ref 3–12)
Neutro Abs: 2.6 10*3/uL (ref 1.7–7.7)
RDW: 12.6 % (ref 11.5–15.5)

## 2012-08-02 LAB — URINALYSIS, ROUTINE W REFLEX MICROSCOPIC
Bilirubin Urine: NEGATIVE
Glucose, UA: NEGATIVE mg/dL
Ketones, ur: NEGATIVE mg/dL
Leukocytes, UA: NEGATIVE
Nitrite: NEGATIVE
Protein, ur: NEGATIVE mg/dL

## 2012-08-02 LAB — BASIC METABOLIC PANEL
BUN: 6 mg/dL (ref 6–23)
Calcium: 9.8 mg/dL (ref 8.4–10.5)
Chloride: 92 mEq/L — ABNORMAL LOW (ref 96–112)
Creatinine, Ser: 0.54 mg/dL (ref 0.50–1.10)
GFR calc Af Amer: >90 mL/min (ref >90)
GFR calc non Af Amer: >90 mL/min (ref >90)

## 2012-08-02 MED ORDER — ONDANSETRON 4 MG PO TBDP
4.0000 mg | ORAL_TABLET | Freq: Once | ORAL | Status: AC
  Administered 2012-08-02: 4 mg via ORAL
  Filled 2012-08-02: qty 1

## 2012-08-02 MED ORDER — CYCLOBENZAPRINE HCL 10 MG PO TABS: 5.0000 mg | ORAL_TABLET | Freq: Two times a day (BID) | ORAL | Status: DC | PRN

## 2012-08-02 MED ORDER — HYDROMORPHONE HCL PF 1 MG/ML IJ SOLN
1.0000 mg | Freq: Once | INTRAMUSCULAR | Status: AC
  Administered 2012-08-02: 1 mg via INTRAMUSCULAR
  Filled 2012-08-02: qty 1

## 2012-08-02 NOTE — ED Provider Notes (Signed)
History     CSN: [id number]  Arrival date & time 08/02/12  0908   First MD Initiated Contact with Patient 08/02/12 1009      Chief Complaint  Patient presents with  . Back Pain  . Dysuria    (Consider location/radiation/quality/duration/timing/severity/associated sxs/prior treatment) HPI  Patient presents to the ED with complaints of low back pain and urinary hesitancy. She has chronic back pain which normally resolved with a hydrocodone. But this time the pain has been persisting for two weeks and not resolving. She says that it bothers her after 5 minutes no matter what position she is in. She denies loss of bowel/urine control, weakness to her extremities, numbness or tingling. She does describe some nausea, anorexia and large weight loss int he past year. This is due to depression and issues with her partial teeth that she is in the process of getting fixed. Denies fall or injury to exacerbate her pain. nad vss  Past Medical History  Diagnosis Date  . Hypertension     Past Surgical History  Procedure Laterality Date  . Cesarean section    . Curretage for endometriosis  93, 94,95, 96  . Cholecystectomy    . Gyn surgery    . Abdominal hysterectomy    . Ovarian cyst removal    . Hernia repair      Family History  Problem Relation Age of Onset  . Cancer Mother   . Stroke Mother   . COPD Mother   . Dementia Mother   . Diabetes Father   . Heart attack Father     History  Substance Use Topics  . Smoking status: Current Every Day Smoker -- 1.00 packs/day for 30 years    Types: Cigarettes  . Smokeless tobacco: Former Neurosurgeon  . Alcohol Use: Yes     Comment: occasional    OB History   Grav Para Term Preterm Abortions TAB SAB Ect Mult Living                  Review of Systems  Constitutional: Positive for unexpected weight change.  Musculoskeletal: Positive for back pain.  All other systems reviewed and are negative.    Allergies  Amoxicillin and Sulfonamide  derivatives  Home Medications   Current Outpatient Rx  Name  Route  Sig  Dispense  Refill  . nitroGLYCERIN (NITROSTAT) 0.3 MG SL tablet   Sublingual   Place 0.3 mg under the tongue every 5 (five) minutes as needed.           Marland Kitchen olmesartan-hydrochlorothiazide (BENICAR HCT) 40-25 MG per tablet   Oral   Take 0.5 tablets by mouth daily.         Marland Kitchen oxyCODONE-acetaminophen (PERCOCET/ROXICET) 5-325 MG per tablet   Oral   Take 1 tablet by mouth once.   15 tablet   0   . cyclobenzaprine (FLEXERIL) 10 MG tablet   Oral   Take 0.5-1 tablets (5-10 mg total) by mouth 2 (two) times daily as needed for muscle spasms.   20 tablet   0     BP 144/86  Pulse 96  Temp(Src) 98.5 F (36.9 C) (Oral)  Resp 20  SpO2 100%  Physical Exam  Nursing note and vitals reviewed. Constitutional: She appears well-developed and well-nourished. No distress.  HENT:  Head: Normocephalic and atraumatic.  Eyes: Pupils are equal, round, and reactive to light.  Neck: Normal range of motion. Neck supple.  Cardiovascular: Normal rate and regular rhythm.   Pulmonary/Chest:  Effort normal.  Abdominal: Soft.    Musculoskeletal:       Back:   Equal strength to bilateral lower extremities. Neurosensory  function adequate to both legs. Skin color is normal. Skin is warm and moist. I see no step off deformity, no bony tenderness. Pt is able to ambulate without limp. Pain is relieved when sitting in certain positions. ROM is decreased due to pain. No crepitus, laceration, effusion, swelling.  Pulses are normal   Neurological: She is alert.  Skin: Skin is warm and dry.    ED Course  Procedures (including critical care time)  Labs Reviewed  BASIC METABOLIC PANEL - Abnormal; Notable for the following:    Sodium 130 (*)    Chloride 92 (*)    Glucose, Bld 111 (*)    All other components within normal limits  URINALYSIS, ROUTINE W REFLEX MICROSCOPIC  CBC WITH DIFFERENTIAL   No results found.   1. Back  pain       MDM  Patients sodium is on the lower side as well as her chloride. She admits to hardly drinking water. I recommended to her that she start taking a multivitamin and advised her of the toll not eating/drinking is taking on her body. She has agreed to start seeing a PCP and has been referred to an orthopedist who she plans to see.  Also discussed getting her teeth fixed so that she can eat normal again.  She has had significant relief from her pain after the 1mg  IM Dilaudid and 4mg  ODT Zofran  Patient with back pain. No neurological deficits. Patient is ambulatory. No warning symptoms of back pain including: loss of bowel or bladder control, night sweats, waking from sleep with back pain, unexplained fevers or weight loss, h/o cancer, IVDU, recent trauma. No concern for cauda equina, epidural abscess, or other serious cause of back pain. Conservative measures such as rest, ice/heat and pain medicine indicated with PCP follow-up if no improvement with conservative management.      Pt has been advised of the symptoms that warrant their return to the ED. Patient has voiced understanding and has agreed to follow-up with the PCP or specialist.         Dorthula Matas, PA-C 08/02/12 1158

## 2012-08-02 NOTE — ED Notes (Signed)
Bed:WA13<BR> Expected date:<BR> Expected time:<BR> Means of arrival:<BR> Comments:<BR> Hold for triage

## 2012-08-02 NOTE — ED Notes (Signed)
Per patient, back ache for 2 weeks-unable to perform everyday tasks-urinary hesitance-no burning

## 2012-08-03 NOTE — ED Provider Notes (Signed)
Medical screening examination/treatment/procedure(s) were performed by non-physician practitioner and as supervising physician I was immediately available for consultation/collaboration.  Sunya Humbarger T Aunika Kirsten, MD 08/03/12 0705 

## 2013-02-28 ENCOUNTER — Encounter (HOSPITAL_COMMUNITY): Payer: Self-pay | Admitting: Emergency Medicine

## 2013-02-28 ENCOUNTER — Emergency Department (HOSPITAL_COMMUNITY)
Admission: EM | Admit: 2013-02-28 | Discharge: 2013-02-28 | Disposition: A | Discharge status: Home / Self Care | Payer: Medicare Other | Attending: Emergency Medicine | Admitting: Emergency Medicine

## 2013-02-28 ENCOUNTER — Emergency Department (HOSPITAL_COMMUNITY): Payer: Medicare Other

## 2013-02-28 DIAGNOSIS — F17200 Nicotine dependence, unspecified, uncomplicated: Secondary | ICD-10-CM | POA: Insufficient documentation

## 2013-02-28 DIAGNOSIS — I10 Essential (primary) hypertension: Secondary | ICD-10-CM | POA: Insufficient documentation

## 2013-02-28 DIAGNOSIS — Y92009 Unspecified place in unspecified non-institutional (private) residence as the place of occurrence of the external cause: Secondary | ICD-10-CM | POA: Insufficient documentation

## 2013-02-28 DIAGNOSIS — Y939 Activity, unspecified: Secondary | ICD-10-CM | POA: Insufficient documentation

## 2013-02-28 DIAGNOSIS — W010XXA Fall on same level from slipping, tripping and stumbling without subsequent striking against object, initial encounter: Secondary | ICD-10-CM | POA: Insufficient documentation

## 2013-02-28 DIAGNOSIS — J40 Bronchitis, not specified as acute or chronic: Secondary | ICD-10-CM | POA: Insufficient documentation

## 2013-02-28 DIAGNOSIS — S3981XA Other specified injuries of abdomen, initial encounter: Secondary | ICD-10-CM | POA: Insufficient documentation

## 2013-02-28 DIAGNOSIS — R109 Unspecified abdominal pain: Secondary | ICD-10-CM

## 2013-02-28 DIAGNOSIS — R197 Diarrhea, unspecified: Secondary | ICD-10-CM | POA: Insufficient documentation

## 2013-02-28 DIAGNOSIS — R112 Nausea with vomiting, unspecified: Secondary | ICD-10-CM

## 2013-02-28 HISTORY — DX: Reserved for inherently not codable concepts without codable children: IMO0001

## 2013-02-28 LAB — CBC WITH DIFFERENTIAL/PLATELET
Basophils Absolute: 0 10*3/uL (ref 0.0–0.1)
Basophils Relative: 1 % (ref 0–1)
Eosinophils Relative: 1 % (ref 0–5)
HCT: 40.6 % (ref 36.0–46.0)
Hemoglobin: 14.2 g/dL (ref 12.0–15.0)
MCH: 32.4 pg (ref 26.0–34.0)
MCHC: 35 g/dL (ref 30.0–36.0)
MCV: 92.7 fL (ref 78.0–100.0)
Monocytes Absolute: 0.4 10*3/uL (ref 0.1–1.0)
Monocytes Relative: 6 % (ref 3–12)
Neutro Abs: 3.8 10*3/uL (ref 1.7–7.7)
RDW: 12.5 % (ref 11.5–15.5)

## 2013-02-28 LAB — COMPREHENSIVE METABOLIC PANEL
Albumin: 3.8 g/dL (ref 3.5–5.2)
BUN: 7 mg/dL (ref 6–23)
CO2: 22 mEq/L (ref 19–32)
Calcium: 9.2 mg/dL (ref 8.4–10.5)
Chloride: 102 mEq/L (ref 96–112)
Creatinine, Ser: 0.59 mg/dL (ref 0.50–1.10)
GFR calc non Af Amer: >90 mL/min (ref >90)
Total Bilirubin: 0.4 mg/dL (ref 0.3–1.2)

## 2013-02-28 LAB — URINALYSIS W MICROSCOPIC + REFLEX CULTURE
Bilirubin Urine: NEGATIVE
Ketones, ur: NEGATIVE mg/dL
Leukocytes, UA: NEGATIVE
Nitrite: NEGATIVE
Protein, ur: NEGATIVE mg/dL
Urobilinogen, UA: 1 mg/dL (ref 0.0–1.0)
pH: 6 (ref 5.0–8.0)

## 2013-02-28 LAB — LIPASE, BLOOD: Lipase: 7 U/L — ABNORMAL LOW (ref 11–59)

## 2013-02-28 LAB — TROPONIN I: Troponin I: <0.3 ng/mL (ref <0.30)

## 2013-02-28 LAB — LACTIC ACID, PLASMA: Lactic Acid, Venous: 0.9 mmol/L (ref 0.5–2.2)

## 2013-02-28 MED ORDER — ONDANSETRON HCL 4 MG PO TABS: 4.0000 mg | ORAL_TABLET | Freq: Three times a day (TID) | ORAL | Status: DC | PRN

## 2013-02-28 MED ORDER — ALBUTEROL SULFATE (5 MG/ML) 0.5% IN NEBU
5.0000 mg | INHALATION_SOLUTION | Freq: Once | RESPIRATORY_TRACT | Status: AC
  Administered 2013-02-28: 5 mg via RESPIRATORY_TRACT
  Filled 2013-02-28: qty 1

## 2013-02-28 MED ORDER — FAMOTIDINE IN NACL 20-0.9 MG/50ML-% IV SOLN
20.0000 mg | Freq: Once | INTRAVENOUS | Status: AC
  Administered 2013-02-28: 20 mg via INTRAVENOUS
  Filled 2013-02-28: qty 50

## 2013-02-28 MED ORDER — IPRATROPIUM BROMIDE 0.02 % IN SOLN
0.5000 mg | Freq: Once | RESPIRATORY_TRACT | Status: AC
  Administered 2013-02-28: 0.5 mg via RESPIRATORY_TRACT
  Filled 2013-02-28: qty 2.5

## 2013-02-28 MED ORDER — IOHEXOL 300 MG/ML  SOLN
100.0000 mL | Freq: Once | INTRAMUSCULAR | Status: AC | PRN
  Administered 2013-02-28: 100 mL via INTRAVENOUS

## 2013-02-28 MED ORDER — ONDANSETRON HCL 4 MG/2ML IJ SOLN
4.0000 mg | INTRAMUSCULAR | Status: AC | PRN
  Administered 2013-02-28 (×2): 4 mg via INTRAVENOUS
  Filled 2013-02-28 (×2): qty 2

## 2013-02-28 MED ORDER — MORPHINE SULFATE 4 MG/ML IJ SOLN
4.0000 mg | INTRAMUSCULAR | Status: DC | PRN
  Administered 2013-02-28: 4 mg via INTRAVENOUS
  Filled 2013-02-28: qty 1

## 2013-02-28 MED ORDER — SODIUM CHLORIDE 0.9 % IV SOLN
INTRAVENOUS | Status: DC
  Administered 2013-02-28: 13:00:00 via INTRAVENOUS

## 2013-02-28 MED ORDER — IOHEXOL 300 MG/ML  SOLN
50.0000 mL | Freq: Once | INTRAMUSCULAR | Status: AC | PRN
  Administered 2013-02-28: 50 mL via ORAL

## 2013-02-28 MED ORDER — ALBUTEROL SULFATE HFA 108 (90 BASE) MCG/ACT IN AERS: 2.0000 | INHALATION_SPRAY | RESPIRATORY_TRACT | Status: DC | PRN

## 2013-02-28 NOTE — ED Notes (Signed)
Pt insisting on d/c IV.  States she feels fine.

## 2013-02-28 NOTE — ED Notes (Signed)
Per pt, states she fell last Friday and is having left side pain-is now worse with N/V which started Saturday

## 2013-02-28 NOTE — ED Provider Notes (Signed)
CSN: [id number]     Arrival date & time 02/28/13  0865 History   First MD Initiated Contact with Patient 02/28/13 1112     Chief Complaint  Patient presents with  . Emesis   HPI Pt was seen at 1145.  Per pt, c/o gradual onset and persistence of constant upper abd "pain" for the past 1 week.  Has been associated with multiple intermittent episodes of N/V/D as well as moist cough.  Describes the abd pain as "aching." States the pain began after she slipped and fell at home, landing on her left side. Denies fevers, no neck pain, no back pain, no rash, no CP/SOB, no black or blood in stools or emesis.       Past Medical History  Diagnosis Date  . Hypertension   . Normal cardiac stress test 2007   Past Surgical History  Procedure Laterality Date  . Cesarean section    . Curretage for endometriosis  93, 94,95, 96  . Cholecystectomy    . Gyn surgery    . Abdominal hysterectomy    . Ovarian cyst removal    . Hernia repair     Family History  Problem Relation Age of Onset  . Cancer Mother   . Stroke Mother   . COPD Mother   . Dementia Mother   . Diabetes Father   . Heart attack Father    History  Substance Use Topics  . Smoking status: Current Every Day Smoker -- 1.00 packs/day for 30 years    Types: Cigarettes  . Smokeless tobacco: Former Neurosurgeon  . Alcohol Use: Yes     Comment: occasional    Review of Systems ROS: Statement: All systems negative except as marked or noted in the HPI; Constitutional: Negative for fever and chills. ; ; Eyes: Negative for eye pain, redness and discharge. ; ; ENMT: Negative for ear pain, hoarseness, nasal congestion, sinus pressure and sore throat. ; ; Cardiovascular: Negative for chest pain, palpitations, diaphoresis, dyspnea and peripheral edema. ; ; Respiratory: +cough. Negative for wheezing and stridor. ; ; Gastrointestinal: +N/V/D, abd pain. Negative for blood in stool, hematemesis, jaundice and rectal bleeding. . ; ; Genitourinary: Negative for  dysuria, flank pain and hematuria. ; ; Musculoskeletal: Negative for back pain and neck pain. Negative for swelling and trauma.; ; Skin: Negative for pruritus, rash, abrasions, blisters, bruising and skin lesion.; ; Neuro: Negative for headache, lightheadedness and neck stiffness. Negative for weakness, altered level of consciousness , altered mental status, extremity weakness, paresthesias, involuntary movement, seizure and syncope.      Allergies  Amoxicillin and Sulfonamide derivatives  Home Medications   Current Outpatient Rx  Name  Route  Sig  Dispense  Refill  . acetaminophen (TYLENOL) 500 MG tablet   Oral   Take 1,000 mg by mouth every 6 (six) hours as needed (pain).         . Amlodipine-Olmesartan (AZOR PO)   Oral   Take 0.25 tablets by mouth every morning.         Marland Kitchen oxyCODONE-acetaminophen (PERCOCET/ROXICET) 5-325 MG per tablet   Oral   Take 1 tablet by mouth once.   15 tablet   0   . nitroGLYCERIN (NITROSTAT) 0.3 MG SL tablet   Sublingual   Place 0.3 mg under the tongue every 5 (five) minutes as needed.            BP 133/80  Pulse 88  Temp(Src) 98.2 F (36.8 C) (Oral)  Resp 20  SpO2 95% Physical Exam 1150: Physical examination:  Nursing notes reviewed; Vital signs and O2 SAT reviewed;  Constitutional: Well developed, Well nourished, Well hydrated, In no acute distress; Head:  Normocephalic, atraumatic; Eyes: EOMI, PERRL, No scleral icterus; ENMT: Mouth and pharynx normal, Mucous membranes moist; Neck: Supple, Full range of motion, No lymphadenopathy; Cardiovascular: Regular rate and rhythm, No gallop; Respiratory: Breath sounds coarse & equal bilaterally, No wheezes. Moist cough during exam. Speaking full sentences with ease, Normal respiratory effort/excursion; Chest: Nontender, Movement normal; Abdomen: Soft, +mild mid-epigastric and LUQ tenderness to palp. No rebound or guarding. Nondistended, Normal bowel sounds; Genitourinary: No CVA tenderness; Extremities:  Pulses normal, No tenderness, No edema, No calf edema or asymmetry.; Neuro: AA&Ox3, Major CN grossly intact.  Speech clear. Climbs on and off stretcher easily by herself. Gait steady. No gross focal motor or sensory deficits in extremities.; Skin: Color normal, Warm, Dry.   ED Course  Procedures    EKG Interpretation    Date/Time:  Thursday February 28 2013 12:17:50 EST Ventricular Rate:  88 PR Interval:  161 QRS Duration: 72 QT Interval:  359 QTC Calculation: 434 R Axis:   76 Text Interpretation:  Sinus rhythm Baseline wander No significant change since last tracing dated 08/13/2009 Confirmed by [facility]  MD, Nicholos Johns [phone]) on 02/28/2013 1:17:18 PM            MDM  MDM Reviewed: previous chart, nursing note and vitals Reviewed previous: labs and ECG Interpretation: labs, ECG, x-ray and CT scan     Results for orders placed during the hospital encounter of 02/28/13  URINALYSIS W MICROSCOPIC + REFLEX CULTURE      Result Value Range   Color, Urine YELLOW  YELLOW   APPearance CLEAR  CLEAR   Specific Gravity, Urine 1.017  1.005 - 1.030   pH 6.0  5.0 - 8.0   Glucose, UA NEGATIVE  NEGATIVE mg/dL   Hgb urine dipstick NEGATIVE  NEGATIVE   Bilirubin Urine NEGATIVE  NEGATIVE   Ketones, ur NEGATIVE  NEGATIVE mg/dL   Protein, ur NEGATIVE  NEGATIVE mg/dL   Urobilinogen, UA 1.0  0.0 - 1.0 mg/dL   Nitrite NEGATIVE  NEGATIVE   Leukocytes, UA NEGATIVE  NEGATIVE   Bacteria, UA FEW (*) RARE   Squamous Epithelial / LPF RARE  RARE   Urine-Other MUCOUS PRESENT    CBC WITH DIFFERENTIAL      Result Value Range   WBC 5.6  4.0 - 10.5 K/uL   RBC 4.38  3.87 - 5.11 MIL/uL   Hemoglobin 14.2  12.0 - 15.0 g/dL   HCT 62.1  30.8 - 65.7 %   MCV 92.7  78.0 - 100.0 fL   MCH 32.4  26.0 - 34.0 pg   MCHC 35.0  30.0 - 36.0 g/dL   RDW 84.6  96.2 - 95.2 %   Platelets 240  150 - 400 K/uL   Neutrophils Relative % 67  43 - 77 %   Neutro Abs 3.8  1.7 - 7.7 K/uL   Lymphocytes Relative 25  12 - 46 %    Lymphs Abs 1.4  0.7 - 4.0 K/uL   Monocytes Relative 6  3 - 12 %   Monocytes Absolute 0.4  0.1 - 1.0 K/uL   Eosinophils Relative 1  0 - 5 %   Eosinophils Absolute 0.0  0.0 - 0.7 K/uL   Basophils Relative 1  0 - 1 %   Basophils Absolute 0.0  0.0 - 0.1 K/uL  COMPREHENSIVE  METABOLIC PANEL      Result Value Range   Sodium 138  135 - 145 mEq/L   Potassium 3.5  3.5 - 5.1 mEq/L   Chloride 102  96 - 112 mEq/L   CO2 22  19 - 32 mEq/L   Glucose, Bld 100 (*) 70 - 99 mg/dL   BUN 7  6 - 23 mg/dL   Creatinine, Ser 4.09  0.50 - 1.10 mg/dL   Calcium 9.2  8.4 - 81.1 mg/dL   Total Protein 7.0  6.0 - 8.3 g/dL   Albumin 3.8  3.5 - 5.2 g/dL   AST 13  0 - 37 U/L   ALT 9  0 - 35 U/L   Alkaline Phosphatase 84  39 - 117 U/L   Total Bilirubin 0.4  0.3 - 1.2 mg/dL   GFR calc non Af Amer >90  >90 mL/min   GFR calc Af Amer >90  >90 mL/min  LIPASE, BLOOD      Result Value Range   Lipase 7 (*) 11 - 59 U/L  TROPONIN I      Result Value Range   Troponin I <0.30  <0.30 ng/mL  LACTIC ACID, PLASMA      Result Value Range   Lactic Acid, Venous 0.9  0.5 - 2.2 mmol/L   Dg Chest 2 View 02/28/2013   CLINICAL DATA:  Shortness of breath  EXAM: CHEST  2 VIEW  COMPARISON:  January 18, 2011  FINDINGS: The heart size and mediastinal contours are within normal limits. There is no focal infiltrate, pulmonary edema, or pleural effusion. There is scoliosis of spine.  IMPRESSION: No active cardiopulmonary disease.   Electronically Signed   By: Sherian Rein M.D.   On: 02/28/2013 13:12   Ct Abdomen Pelvis W Contrast 02/28/2013   CLINICAL DATA:  Abdominal pain.  Nausea and vomiting.  Diarrhea.  EXAM: CT ABDOMEN AND PELVIS WITH CONTRAST  TECHNIQUE: Multidetector CT imaging of the abdomen and pelvis was performed using the standard protocol following bolus administration of intravenous contrast.  CONTRAST:  OMNIPAQUE IOHEXOL 300 MG/ML  SOLN  COMPARISON:  None.  FINDINGS: Surgical clips from prior cholecystectomy noted. No  evidence of biliary dilatation. The liver, pancreas, spleen, adrenal glands, and kidneys are normal in appearance. No evidence of hydronephrosis. No soft tissue masses or lymphadenopathy identified within the abdomen or pelvis.  Prior hysterectomy noted. Adnexal regions are unremarkable in appearance. No evidence of inflammatory process or abscess. No evidence of dilated bowel loops or bowel wall thickening. A small paraumbilical hernia is seen containing only omental fat. No evidence herniated bowel loops. Tiny amount of free fluid noted in pelvic cul-de-sac.  IMPRESSION: No acute findings.  Small paraumbilical hernia containing only fat.   Electronically Signed   By: Myles Rosenthal M.D.   On: 02/28/2013 13:59     1145:  Neb completed; lungs CTA bilat, Sats 100% R/A. Pt has tol PO well while in the ED without N/V.  No stooling while in the ED.  Abd benign, VSS. Feels better and wants to go home now, requesting ED RN to d/c her IV. Workup today reassuring; will tx symptomatically. Dx and testing d/w pt.  Questions answered.  Verb understanding, agreeable to d/c home with outpt f/u.     Laray Anger, DO 03/02/13 [phone]

## 2013-02-28 NOTE — ED Notes (Signed)
  Chaplain made cold call to pt on today. Pt is in some pain, but did receive services from chaplain. Pt has some family support but feels overwhelmed with having to provide care for granddaughter while daughter works. Pt requested prayer and chaplain did provide prayer, spiritual care and a listening ear. Pt thanked chaplain for visit.    Cindie Crumbly, chaplain

## 2013-05-23 ENCOUNTER — Other Ambulatory Visit: Payer: Self-pay

## 2013-05-23 DIAGNOSIS — Z1231 Encounter for screening mammogram for malignant neoplasm of breast: Secondary | ICD-10-CM

## 2013-05-28 ENCOUNTER — Emergency Department (HOSPITAL_COMMUNITY)
Admission: EM | Admit: 2013-05-28 | Discharge: 2013-05-28 | Disposition: A | Discharge status: Home / Self Care | Payer: Medicare Other | Attending: Emergency Medicine | Admitting: Emergency Medicine

## 2013-05-28 ENCOUNTER — Encounter (HOSPITAL_COMMUNITY): Payer: Self-pay | Admitting: Emergency Medicine

## 2013-05-28 DIAGNOSIS — F39 Unspecified mood [affective] disorder: Secondary | ICD-10-CM | POA: Insufficient documentation

## 2013-05-28 DIAGNOSIS — R000 Tachycardia, unspecified: Secondary | ICD-10-CM | POA: Insufficient documentation

## 2013-05-28 DIAGNOSIS — I10 Essential (primary) hypertension: Secondary | ICD-10-CM | POA: Insufficient documentation

## 2013-05-28 DIAGNOSIS — G8929 Other chronic pain: Secondary | ICD-10-CM | POA: Insufficient documentation

## 2013-05-28 DIAGNOSIS — F17200 Nicotine dependence, unspecified, uncomplicated: Secondary | ICD-10-CM | POA: Insufficient documentation

## 2013-05-28 DIAGNOSIS — M549 Dorsalgia, unspecified: Secondary | ICD-10-CM

## 2013-05-28 DIAGNOSIS — Z79899 Other long term (current) drug therapy: Secondary | ICD-10-CM | POA: Insufficient documentation

## 2013-05-28 DIAGNOSIS — M545 Low back pain, unspecified: Secondary | ICD-10-CM | POA: Insufficient documentation

## 2013-05-28 MED ORDER — METHOCARBAMOL 500 MG PO TABS: 1000.0000 mg | ORAL_TABLET | Freq: Four times a day (QID) | ORAL | Status: DC

## 2013-05-28 MED ORDER — HYDROCODONE-ACETAMINOPHEN 5-325 MG PO TABS: ORAL_TABLET | ORAL | Status: DC

## 2013-05-28 NOTE — ED Notes (Signed)
Pt reports chronic low right back pain. Lifted dog yesterday to take to vet. Pain increased today

## 2013-05-28 NOTE — Discharge Instructions (Signed)
Please read and follow all provided instructions.  Your diagnoses today include:  1. Back pain     Tests performed today include:  Vital signs - see below for your results today  Medications prescribed:   Vicodin (hydrocodone/acetaminophen) - narcotic pain medication  DO NOT drive or perform any activities that require you to be awake and alert because this medicine can make you drowsy. BE VERY CAREFUL not to take multiple medicines containing Tylenol (also called acetaminophen). Doing so can lead to an overdose which can damage your liver and cause liver failure and possibly death.   Robaxin (methocarbamol) - muscle relaxer medication  DO NOT drive or perform any activities that require you to be awake and alert because this medicine can make you drowsy.   Take any prescribed medications only as directed.  Home care instructions:   Follow any educational materials contained in this packet  Please rest, use ice or heat on your back for the next several days  Do not lift, push, pull anything more than 10 pounds for the next week  Follow-up instructions: Please follow-up with your primary care provider in the next 1 week for further evaluation of your symptoms. If you do not have a primary care doctor -- see below for referral information.   Return instructions:  SEEK IMMEDIATE MEDICAL ATTENTION IF YOU HAVE:  New numbness, tingling, weakness, or problem with the use of your arms or legs  Severe back pain not relieved with medications  Loss control of your bowels or bladder  Increasing pain in any areas of the body (such as chest or abdominal pain)  Shortness of breath, dizziness, or fainting.   Worsening nausea (feeling sick to your stomach), vomiting, fever, or sweats  Any other emergent concerns regarding your health   Additional Information:  Your vital signs today were: BP 127/80   Pulse 110   Resp 20   Wt 87 lb (39.463 kg)   SpO2 98% If your blood pressure  (BP) was elevated above 135/85 this visit, please have this repeated by your doctor within one month. --------------

## 2013-05-28 NOTE — ED Provider Notes (Signed)
CSN: [id number]     Arrival date & time 05/28/13  1312 History  This chart was scribed for non-physician practitioner working with Brittany Chick, MD by Brittany [name], ED scribe. This patient was seen in room WTR6/WTR6 and the patient's care was started at 1:40 PM.  None    Chief Complaint  Patient presents with  . Back Pain    low right back pain x week   (Consider location/radiation/quality/duration/timing/severity/associated sxs/prior Treatment) The history is provided by the patient and medical records. No language interpreter was used.   HPI Comments: Brittany [name] is a 58 y.o. female who presents to the Emergency Department complaining of intense, chronic right sided lumbar pain that has been much worse since yesterday after lifting her 40 lb dog. The pain is persistent and does not radiate. Pt mentions the site feels swollen and "inflamed". Pt has tried Extra Strength Tylenol without relief. Pt has not gone to church due to pain. Pt reports the pain is affecting her ADL's and is unable to sit for extended periods of time or shower comfortably. Denies difficulty urinating, bowel incontinence and inability to void.  Pt was given Doxycycline and Prednisone after her prior visit to the ED for cough and upper back/neck pain. She states having chronic left sided pain is manageable and does not disrupt her ADL.  She was able to drive to the ED.  Denies abnormal gait problems and to ambulate without assistance.   Pt mentions that Flexeril makes her react "evil" and "real mean".  Her heart rate is elevated  (HR 126 taken in the ED) and she reports elevated HR is normal for her. Pt had a tetanus shot last year. She states, her last NTG was a few years ago. Pt's cardiologist is at [facility] Pt does not have a current PCP.  Past Medical History  Diagnosis Date  . Hypertension   . Normal cardiac stress test 2007   Past Surgical History  Procedure Laterality Date  . Cesarean section     . Curretage for endometriosis  93, 94,95, 96  . Cholecystectomy    . Gyn surgery    . Abdominal hysterectomy    . Ovarian cyst removal    . Hernia repair     Family History  Problem Relation Age of Onset  . Cancer Mother   . Stroke Mother   . COPD Mother   . Dementia Mother   . Diabetes Father   . Heart attack Father    History  Substance Use Topics  . Smoking status: Current Every Day Smoker -- 1.00 packs/day for 30 years    Types: Cigarettes  . Smokeless tobacco: Former Neurosurgeon  . Alcohol Use: Yes     Comment: occasional   OB History   Grav Para Term Preterm Abortions TAB SAB Ect Mult Living                 Review of Systems  Constitutional: Negative for fever and unexpected weight change.  Gastrointestinal: Negative for constipation.       Negative for fecal incontinence.   Genitourinary: Negative for dysuria, hematuria, flank pain, vaginal bleeding, vaginal discharge, difficulty urinating and pelvic pain.       Negative for urinary incontinence or retention.  Musculoskeletal: Positive for back pain and myalgias. Negative for gait problem and neck pain.  Neurological: Negative for weakness and numbness.       Denies saddle paresthesias.  Psychiatric/Behavioral: Positive for dysphoric mood.  Allergies  Amoxicillin and Sulfonamide derivatives  Home Medications   Current Outpatient Rx  Name  Route  Sig  Dispense  Refill  . acetaminophen (TYLENOL) 500 MG tablet   Oral   Take 1,000 mg by mouth every 6 (six) hours as needed (pain).         Marland Kitchen. albuterol (PROVENTIL HFA;VENTOLIN HFA) 108 (90 BASE) MCG/ACT inhaler   Inhalation   Inhale 2 puffs into the lungs every 4 (four) hours as needed for wheezing or shortness of breath.   1 Inhaler   0   . Amlodipine-Olmesartan (AZOR PO)   Oral   Take 0.25 tablets by mouth every morning.         . nitroGLYCERIN (NITROSTAT) 0.3 MG SL tablet   Sublingual   Place 0.3 mg under the tongue every 5 (five) minutes as needed.            . ondansetron (ZOFRAN) 4 MG tablet   Oral   Take 1 tablet (4 mg total) by mouth every 8 (eight) hours as needed for nausea or vomiting.   6 tablet   0   . oxyCODONE-acetaminophen (PERCOCET/ROXICET) 5-325 MG per tablet   Oral   Take 1 tablet by mouth once.   15 tablet   0    BP 127/80  Pulse 126  Resp 20  Wt 87 lb (39.463 kg)  SpO2 97% Physical Exam  Nursing note and vitals reviewed. Constitutional: She appears well-developed and well-nourished.  Awake, alert, nontoxic appearance with baseline speech.  HENT:  Head: Normocephalic and atraumatic.  Eyes: Conjunctivae are normal. Pupils are equal, round, and reactive to light. Right eye exhibits no discharge. Left eye exhibits no discharge.  Neck: Normal range of motion. Neck supple.  Cardiovascular: Regular rhythm.  Tachycardia present.   No murmur heard. Pulmonary/Chest: Effort normal and breath sounds normal. No respiratory distress. She has no wheezes. She has no rales. She exhibits no tenderness.  Abdominal: Soft. Bowel sounds are normal. She exhibits no mass. There is no tenderness. There is no rebound and no CVA tenderness.  Musculoskeletal: Normal range of motion.       Thoracic back: She exhibits no tenderness.       Lumbar back: She exhibits no tenderness.  Lumbar and paraspinal tenderness   Neurological: She is alert. She has normal strength and normal reflexes. No sensory deficit.  Mental status baseline for patient.  Skin: Skin is warm and dry. No rash noted.  Psychiatric: She has a normal mood and affect.    ED Course  Procedures (including critical care time) DIAGNOSTIC STUDIES: Oxygen Saturation is 97% on room air, normal by my interpretation.    COORDINATION OF CARE:  1:48 PM Discussed course of care with pt . Pt understands and agrees.  Labs Review Labs Reviewed - No data to display Imaging Review No results found.   EKG Interpretation None      Patient seen and examined. Work-up  initiated. Medications ordered.   Vital signs reviewed and are as follows: Filed Vitals:   05/28/13 1411  BP:   Pulse: 110  Resp: 20  BP 127/80  Pulse 110  Resp 20  Wt 87 lb (39.463 kg)  SpO2 98%   Tachycardia improved with patient sitting in room.   No red flag s/s of low back pain. Patient was counseled on back pain precautions and told to do activity as tolerated but do not lift, push, or pull heavy objects more than 10 pounds for  the next week. Patient counseled to use ice or heat on back for no longer than 15 minutes every hour.   Patient prescribed muscle relaxer and counseled on proper use of muscle relaxant medication.    Patient prescribed narcotic pain medicine and counseled on proper use of narcotic pain medications. Counseled not to combine this medication with others containing tylenol.   Urged patient not to drink alcohol, drive, or perform any other activities that requires focus while taking either of these medications.  Patient urged to follow-up with PCP if pain does not improve with treatment and rest or if pain becomes recurrent. Urged to return with worsening severe pain, loss of bowel or bladder control, trouble walking.   The patient verbalizes understanding and agrees with the plan.  MDM   Final diagnoses:  Back pain   Patient with back pain after lifting injury. Chronic back pain worsened immediately after lifting dog. No neurological deficits. Patient is ambulatory. No warning symptoms of back pain including: loss of bowel or bladder control, night sweats, waking from sleep with back pain, unexplained fevers or weight loss, h/o cancer, IVDU, recent trauma. No concern for cauda equina, epidural abscess, or other serious cause of back pain. Conservative measures such as rest, ice/heat and pain medicine indicated with PCP follow-up if no improvement with conservative management.   I personally performed the services described in this documentation, which was  scribed in my presence. The recorded information has been reviewed and is accurate.    Renne Crigler, PA-C 05/28/13 [phone]

## 2013-05-29 NOTE — ED Provider Notes (Signed)
Medical screening examination/treatment/procedure(s) were performed by non-physician practitioner and as supervising physician I was immediately available for consultation/collaboration.   EKG Interpretation None       Martha K Linker, MD 05/29/13 1509 

## 2013-06-07 ENCOUNTER — Ambulatory Visit
Admission: RE | Admit: 2013-06-07 | Discharge: 2013-06-07 | Disposition: A | Discharge status: Home / Self Care | Payer: Medicare Other | Source: Ambulatory Visit

## 2013-06-07 DIAGNOSIS — Z1231 Encounter for screening mammogram for malignant neoplasm of breast: Secondary | ICD-10-CM

## 2013-07-11 ENCOUNTER — Emergency Department (HOSPITAL_COMMUNITY): Payer: Medicare Other

## 2013-07-11 ENCOUNTER — Encounter (HOSPITAL_COMMUNITY): Payer: Self-pay | Admitting: Emergency Medicine

## 2013-07-11 ENCOUNTER — Observation Stay (HOSPITAL_COMMUNITY)
Admission: EM | Admit: 2013-07-11 | Discharge: 2013-07-12 | Disposition: A | Discharge status: Home / Self Care | Payer: Medicare Other | Attending: Cardiovascular Disease | Admitting: Cardiovascular Disease

## 2013-07-11 DIAGNOSIS — R55 Syncope and collapse: Secondary | ICD-10-CM | POA: Diagnosis present

## 2013-07-11 DIAGNOSIS — I2510 Atherosclerotic heart disease of native coronary artery without angina pectoris: Secondary | ICD-10-CM | POA: Insufficient documentation

## 2013-07-11 DIAGNOSIS — J449 Chronic obstructive pulmonary disease, unspecified: Secondary | ICD-10-CM | POA: Insufficient documentation

## 2013-07-11 DIAGNOSIS — E440 Moderate protein-calorie malnutrition: Secondary | ICD-10-CM | POA: Insufficient documentation

## 2013-07-11 DIAGNOSIS — R29898 Other symptoms and signs involving the musculoskeletal system: Secondary | ICD-10-CM

## 2013-07-11 DIAGNOSIS — R5383 Other fatigue: Secondary | ICD-10-CM

## 2013-07-11 DIAGNOSIS — R296 Repeated falls: Secondary | ICD-10-CM | POA: Insufficient documentation

## 2013-07-11 DIAGNOSIS — M25529 Pain in unspecified elbow: Secondary | ICD-10-CM | POA: Insufficient documentation

## 2013-07-11 DIAGNOSIS — R209 Unspecified disturbances of skin sensation: Secondary | ICD-10-CM | POA: Insufficient documentation

## 2013-07-11 DIAGNOSIS — S42253A Displaced fracture of greater tuberosity of unspecified humerus, initial encounter for closed fracture: Principal | ICD-10-CM | POA: Insufficient documentation

## 2013-07-11 DIAGNOSIS — S42309A Unspecified fracture of shaft of humerus, unspecified arm, initial encounter for closed fracture: Secondary | ICD-10-CM | POA: Insufficient documentation

## 2013-07-11 DIAGNOSIS — R5381 Other malaise: Secondary | ICD-10-CM | POA: Insufficient documentation

## 2013-07-11 DIAGNOSIS — Y93K1 Activity, walking an animal: Secondary | ICD-10-CM | POA: Insufficient documentation

## 2013-07-11 DIAGNOSIS — F17200 Nicotine dependence, unspecified, uncomplicated: Secondary | ICD-10-CM | POA: Insufficient documentation

## 2013-07-11 DIAGNOSIS — S42201A Unspecified fracture of upper end of right humerus, initial encounter for closed fracture: Secondary | ICD-10-CM

## 2013-07-11 DIAGNOSIS — S0081XA Abrasion of other part of head, initial encounter: Secondary | ICD-10-CM

## 2013-07-11 DIAGNOSIS — W010XXA Fall on same level from slipping, tripping and stumbling without subsequent striking against object, initial encounter: Secondary | ICD-10-CM

## 2013-07-11 DIAGNOSIS — IMO0002 Reserved for concepts with insufficient information to code with codable children: Secondary | ICD-10-CM | POA: Insufficient documentation

## 2013-07-11 DIAGNOSIS — S60211A Contusion of right wrist, initial encounter: Secondary | ICD-10-CM

## 2013-07-11 DIAGNOSIS — M25519 Pain in unspecified shoulder: Secondary | ICD-10-CM | POA: Insufficient documentation

## 2013-07-11 DIAGNOSIS — I10 Essential (primary) hypertension: Secondary | ICD-10-CM | POA: Insufficient documentation

## 2013-07-11 DIAGNOSIS — Z9089 Acquired absence of other organs: Secondary | ICD-10-CM | POA: Insufficient documentation

## 2013-07-11 DIAGNOSIS — S4490XA Injury of unspecified nerve at shoulder and upper arm level, unspecified arm, initial encounter: Secondary | ICD-10-CM

## 2013-07-11 DIAGNOSIS — R079 Chest pain, unspecified: Secondary | ICD-10-CM | POA: Insufficient documentation

## 2013-07-11 DIAGNOSIS — E871 Hypo-osmolality and hyponatremia: Secondary | ICD-10-CM | POA: Insufficient documentation

## 2013-07-11 DIAGNOSIS — J4489 Other specified chronic obstructive pulmonary disease: Secondary | ICD-10-CM | POA: Insufficient documentation

## 2013-07-11 DIAGNOSIS — Z90710 Acquired absence of both cervix and uterus: Secondary | ICD-10-CM | POA: Insufficient documentation

## 2013-07-11 LAB — TROPONIN I

## 2013-07-11 LAB — COMPREHENSIVE METABOLIC PANEL
ALBUMIN: 4.1 g/dL (ref 3.5–5.2)
ALT: 13 U/L (ref 0–35)
AST: 19 U/L (ref 0–37)
Alkaline Phosphatase: 77 U/L (ref 39–117)
BUN: 17 mg/dL (ref 6–23)
CALCIUM: 9.4 mg/dL (ref 8.4–10.5)
CHLORIDE: 91 meq/L — AB (ref 96–112)
CO2: 24 mEq/L (ref 19–32)
CREATININE: 0.7 mg/dL (ref 0.50–1.10)
GFR calc Af Amer: >90 mL/min (ref >90)
GFR calc non Af Amer: >90 mL/min (ref >90)
Glucose, Bld: 101 mg/dL — ABNORMAL HIGH (ref 70–99)
Potassium: 4.3 mEq/L (ref 3.7–5.3)
Sodium: 130 mEq/L — ABNORMAL LOW (ref 137–147)
Total Bilirubin: 0.2 mg/dL — ABNORMAL LOW (ref 0.3–1.2)
Total Protein: 7 g/dL (ref 6.0–8.3)

## 2013-07-11 LAB — URINALYSIS, ROUTINE W REFLEX MICROSCOPIC
Bilirubin Urine: NEGATIVE
GLUCOSE, UA: NEGATIVE mg/dL
Hgb urine dipstick: NEGATIVE
Ketones, ur: NEGATIVE mg/dL
LEUKOCYTES UA: NEGATIVE
Nitrite: NEGATIVE
PROTEIN: NEGATIVE mg/dL
SPECIFIC GRAVITY, URINE: 1.007 (ref 1.005–1.030)
Urobilinogen, UA: 0.2 mg/dL (ref 0.0–1.0)
pH: 8 (ref 5.0–8.0)

## 2013-07-11 LAB — CBC
HEMATOCRIT: 37.8 % (ref 36.0–46.0)
Hemoglobin: 13.1 g/dL (ref 12.0–15.0)
MCH: 31.3 pg (ref 26.0–34.0)
MCHC: 34.7 g/dL (ref 30.0–36.0)
MCV: 90.4 fL (ref 78.0–100.0)
PLATELETS: 199 10*3/uL (ref 150–400)
RBC: 4.18 MIL/uL (ref 3.87–5.11)
RDW: 15 % (ref 11.5–15.5)
WBC: 11 10*3/uL — AB (ref 4.0–10.5)

## 2013-07-11 MED ORDER — NICOTINE 21 MG/24HR TD PT24
21.0000 mg | MEDICATED_PATCH | Freq: Every day | TRANSDERMAL | Status: DC
  Administered 2013-07-11 – 2013-07-12 (×2): 21 mg via TRANSDERMAL
  Filled 2013-07-11 (×2): qty 1

## 2013-07-11 MED ORDER — MEMANTINE HCL 5 MG PO TABS
5.0000 mg | ORAL_TABLET | Freq: Every day | ORAL | Status: DC
  Administered 2013-07-12: 5 mg via ORAL
  Filled 2013-07-11: qty 1

## 2013-07-11 MED ORDER — HYDROMORPHONE HCL PF 1 MG/ML IJ SOLN
0.5000 mg | Freq: Once | INTRAMUSCULAR | Status: AC
  Administered 2013-07-11: 0.5 mg via INTRAVENOUS
  Filled 2013-07-11: qty 1

## 2013-07-11 MED ORDER — HYDROMORPHONE HCL PF 1 MG/ML IJ SOLN
1.0000 mg | Freq: Once | INTRAMUSCULAR | Status: AC
  Administered 2013-07-11: 1 mg via INTRAVENOUS
  Filled 2013-07-11: qty 1

## 2013-07-11 MED ORDER — ONDANSETRON HCL 4 MG/2ML IJ SOLN
4.0000 mg | Freq: Once | INTRAMUSCULAR | Status: AC
  Administered 2013-07-11: 4 mg via INTRAVENOUS
  Filled 2013-07-11: qty 2

## 2013-07-11 MED ORDER — IRBESARTAN 150 MG PO TABS
150.0000 mg | ORAL_TABLET | Freq: Every day | ORAL | Status: DC
  Filled 2013-07-11: qty 1

## 2013-07-11 MED ORDER — SODIUM CHLORIDE 0.9 % IV BOLUS (SEPSIS)
500.0000 mL | Freq: Once | INTRAVENOUS | Status: AC
Start: 2013-07-11 — End: 2013-07-11
  Administered 2013-07-11: 500 mL via INTRAVENOUS

## 2013-07-11 MED ORDER — HYDROMORPHONE HCL PF 1 MG/ML IJ SOLN
1.0000 mg | INTRAMUSCULAR | Status: DC | PRN
  Administered 2013-07-11: 1 mg via INTRAVENOUS
  Filled 2013-07-11: qty 1

## 2013-07-11 MED ORDER — SODIUM CHLORIDE 0.9 % IV BOLUS (SEPSIS)
1000.0000 mL | Freq: Once | INTRAVENOUS | Status: AC
  Administered 2013-07-11: 1000 mL via INTRAVENOUS

## 2013-07-11 MED ORDER — ALPRAZOLAM 0.25 MG PO TABS
0.2500 mg | ORAL_TABLET | Freq: Three times a day (TID) | ORAL | Status: DC | PRN
  Administered 2013-07-11: 0.25 mg via ORAL
  Filled 2013-07-11: qty 1

## 2013-07-11 NOTE — ED Notes (Signed)
Pt not states she is willing to stay for treatment.

## 2013-07-11 NOTE — ED Notes (Signed)
Made patient aware that we need a urine sample and she stated she is unable to go at this time

## 2013-07-11 NOTE — ED Notes (Addendum)
Pt does not want to stay and be admitted to the hospital.  Dr. Algie CofferKadakia was made aware and explain to the pt the benefits and risks of staying versus leaving.  Pt expressed understanding.

## 2013-07-11 NOTE — H&P (Signed)
Referring Physician: Self  Brittany [name] is an 58 y.o. female.                       Chief Complaint: Right arm pain HPI: 58 year old patient with syncopal event with weakness and speech disturbance at home 4 days ago has right arm weakness and numbness without chest pain, nausea or vomiting or shortness of breath. Today was walking dog and dog ran after a cat, pulling pt to ground resulting in proximal humeral metaphysial slightly displaced fracture. Right houlder and right wrist pain, mod-severe, worse w movement and palpation. With fall she hit her head, no loss of conciousness. Has abrasions to face. Tetanus immunization up to date within past year. Denies neck or back pain. Pt also notes intermittent chest pain in past week, and reports taking nitroglycerin prn. States did not have any chest pain or take any nitroglycerin prior to syncopal event 4 days ago.    Past Medical History  Diagnosis Date  . Hypertension   . Normal cardiac stress test 2007      Past Surgical History  Procedure Laterality Date  . Cesarean section    . Curretage for endometriosis  93, 94,95, 96  . Cholecystectomy    . Gyn surgery    . Abdominal hysterectomy    . Ovarian cyst removal    . Hernia repair      Family History  Problem Relation Age of Onset  . Cancer Mother   . Stroke Mother   . COPD Mother   . Dementia Mother   . Diabetes Father   . Heart attack Father    Social History:  reports that she has been smoking Cigarettes.  She has a 30 pack-year smoking history. She has quit using smokeless tobacco. She reports that she drinks alcohol. She reports that she does not use illicit drugs.  Allergies:  Allergies  Allergen Reactions  . Amoxicillin Hives    whelps  . Sulfonamide Derivatives Nausea And Vomiting     (Not in a hospital admission)  Results for orders placed during the hospital encounter of 07/11/13 (from the past 48 hour(s))  CBC     Status: Abnormal   Collection Time     07/11/13  4:07 PM      Result Value Ref Range   WBC 11.0 (*) 4.0 - 10.5 K/uL   RBC 4.18  3.87 - 5.11 MIL/uL   Hemoglobin 13.1  12.0 - 15.0 g/dL   HCT 37.8  36.0 - 46.0 %   MCV 90.4  78.0 - 100.0 fL   MCH 31.3  26.0 - 34.0 pg   MCHC 34.7  30.0 - 36.0 g/dL   RDW 15.0  11.5 - 15.5 %   Platelets 199  150 - 400 K/uL  COMPREHENSIVE METABOLIC PANEL     Status: Abnormal   Collection Time    07/11/13  4:07 PM      Result Value Ref Range   Sodium 130 (*) 137 - 147 mEq/L   Potassium 4.3  3.7 - 5.3 mEq/L   Chloride 91 (*) 96 - 112 mEq/L   CO2 24  19 - 32 mEq/L   Glucose, Bld 101 (*) 70 - 99 mg/dL   BUN 17  6 - 23 mg/dL   Creatinine, Ser 0.70  0.50 - 1.10 mg/dL   Calcium 9.4  8.4 - 10.5 mg/dL   Total Protein 7.0  6.0 - 8.3 g/dL   Albumin  4.1  3.5 - 5.2 g/dL   AST 19  0 - 37 U/L   ALT 13  0 - 35 U/L   Alkaline Phosphatase 77  39 - 117 U/L   Total Bilirubin 0.2 (*) 0.3 - 1.2 mg/dL   GFR calc non Af Amer >90  >90 mL/min   GFR calc Af Amer >90  >90 mL/min   Comment: (NOTE)     The eGFR has been calculated using the CKD EPI equation.     This calculation has not been validated in all clinical situations.     eGFR's persistently <90 mL/min signify possible Chronic Kidney     Disease.  TROPONIN I     Status: None   Collection Time    07/11/13  4:07 PM      Result Value Ref Range   Troponin I <0.30  <0.30 ng/mL   Comment:            Due to the release kinetics of cTnI,     a negative result within the first hours     of the onset of symptoms does not rule out     myocardial infarction with certainty.     If myocardial infarction is still suspected,     repeat the test at appropriate intervals.   Dg Chest 2 View  07/11/2013   CLINICAL DATA:  Fall.  EXAM: CHEST  2 VIEW  COMPARISON:  DG CHEST 2V dated 05/13/2013  FINDINGS: Mediastinum and hilar structures are normal. The lungs are clear of infiltrates. Cardiac structures are unremarkable. No pleural effusion or pneumothorax. Right humeral  fractures are present. These are described on the right shoulder series report.  IMPRESSION: 1. No acute cardiopulmonary disease. 2. Right proximal humeral fractures involving the metaphysis and greater tuberosity.   Electronically Signed   By: Marcello Moores  Register   On: 07/11/2013 17:16   Dg Shoulder Right  07/11/2013   CLINICAL DATA:  Fall.  EXAM: RIGHT SHOULDER - 2+ VIEW  COMPARISON:  None.  FINDINGS: Slightly displaced fracture of the proximal right humeral metaphysis is noted. Fracture greater tuberosity present. This is displaced. No evidence of dislocation or separation.  IMPRESSION: Proximal right humeral fractures involving the metaphysis and greater tuberosity. These are slightly displaced.   Electronically Signed   By: Marcello Moores  Register   On: 07/11/2013 17:15   Dg Wrist Complete Right  07/11/2013   CLINICAL DATA:  Syncope. Numbness in the right arm. Slurred speech.  EXAM: RIGHT WRIST - COMPLETE 3+ VIEW  COMPARISON:  None.  FINDINGS: There is no evidence of fracture or dislocation. There is no evidence of arthropathy or other focal bone abnormality. Soft tissues are unremarkable.  IMPRESSION: 1. No significant abnormality identified. If the patient's tenderness is in the vicinity of the scaphoid/anatomic snuffbox, then cross-sectional imaging or presumptive treatment for occult scaphoid fracture might be considered.   Electronically Signed   By: Sherryl Barters M.D.   On: 07/11/2013 17:16   Ct Head Wo Contrast  07/11/2013   CLINICAL DATA:  Fall.  EXAM: CT HEAD WITHOUT CONTRAST  CT CERVICAL SPINE WITHOUT CONTRAST  TECHNIQUE: Multidetector CT imaging of the head and cervical spine was performed following the standard protocol without intravenous contrast. Multiplanar CT image reconstructions of the cervical spine were also generated.  COMPARISON:  02/16/2012.  FINDINGS: CT HEAD FINDINGS  No mass. No hydrocephalus. No hemorrhage. Orbits and paranasal sinuses clear. No acute bony abnormalities.  CT  CERVICAL SPINE FINDINGS  Soft  tissue structures are unremarkable. Pleural parenchymal thickening and apices most consistent with scarring. No evidence of fracture or dislocation. Diffuse degenerative change present.  IMPRESSION: No acute abnormality .   Electronically Signed   By: Marcello Moores  Register   On: 07/11/2013 16:58   Ct Cervical Spine Wo Contrast  07/11/2013   CLINICAL DATA:  Fall.  EXAM: CT HEAD WITHOUT CONTRAST  CT CERVICAL SPINE WITHOUT CONTRAST  TECHNIQUE: Multidetector CT imaging of the head and cervical spine was performed following the standard protocol without intravenous contrast. Multiplanar CT image reconstructions of the cervical spine were also generated.  COMPARISON:  02/16/2012.  FINDINGS: CT HEAD FINDINGS  No mass. No hydrocephalus. No hemorrhage. Orbits and paranasal sinuses clear. No acute bony abnormalities.  CT CERVICAL SPINE FINDINGS  Soft tissue structures are unremarkable. Pleural parenchymal thickening and apices most consistent with scarring. No evidence of fracture or dislocation. Diffuse degenerative change present.  IMPRESSION: No acute abnormality .   Electronically Signed   By: Marcello Moores  Register   On: 07/11/2013 16:58    Review of Systems  Constitutional: Negative for fever and chills.  HENT: Negative for sore throat.  Eyes: Negative for redness.  Respiratory: Positive for cough. Negative for shortness of breath.  Cardiovascular: Negative for chest pain, palpitations and leg swelling.  Gastrointestinal: Negative for vomiting, abdominal pain, diarrhea and blood in stool.  Genitourinary: Negative for dysuria and flank pain.  Musculoskeletal: Negative for back pain and neck pain.  Skin: Negative for rash.  Neurological: Negative for numbness and headaches.  Hematological: Does not bruise/bleed easily.  Psychiatric/Behavioral: Negative for confusion. Positive anxiety.   Blood pressure 158/90, pulse 117, temperature 98.5 F (36.9 C), temperature source Oral, resp.  rate 16, SpO2 93.00%.  Physical Exam  Nursing note and vitals reviewed.  Constitutional: She appears averagely-developed and averagely-nourished. No distress.  HENT: Abrasions to face, left cheek. Facial bones orbits appear grossly intact, no deformity. No malocclusion.  Eyes: Conjunctivae are normal. Pupils are equal, round, and reactive to light. No scleral icterus.  Neck: Neck supple. No tracheal deviation present. No thyromegaly present. Mild mid cervical tenderness. No bruit  Cardiovascular: Normal rate, regular rhythm, normal heart sounds and intact distal pulses. Exam reveals no gallop and no friction rub. No murmur heard.  Pulmonary/Chest: Effort normal and breath sounds normal. No respiratory distress. She exhibits no tenderness.  Abdominal: Soft. no distension and no tenderness.   Musculoskeletal: She exhibits tenderness. She exhibits no edema. Tenderness right shoulder and right wrist.  Distal pulses palp. Mid cervical tenderness, otherwise, CTLS spine, non tender, aligned, no step off. Neurological: She is alert and oriented to person, place, and time. No cranial nerve deficit.  She cannot move R UE. Will not/unable to move fingers of right hand actively (pt flexes extends them with her left hand). Reports impaired sens R UE. L UE, bil LE motor 5/5. sens intact. Steady gait.  Skin: Skin is warm and dry. No rash noted. She is not diaphoretic.  Psychiatric: She has a normal mood and affect.   Assessment/Plan Right humerus fracture H/O Syncope Tobacco use disorder R/O stroke COPD Coronary artery disease Hypertension  Admit Telemetry Orthopedic consult Analgesic Home medications Nicotine patch per patient request.  Birdie Riddle 07/11/2013, 7:58 PM

## 2013-07-11 NOTE — ED Notes (Signed)
Per pt, noted arm numbness to right arm on Sunday.  Pt states she may have fainted when she fell the first time.  Pt states she has no car and could not go to doctor.  Pt states she has fallen several times since Sunday.  Today, pt out walking her 65lb dog and dog went after a cat.  Pt fell.  Pt has abrasion to left cheek, left forehead, left thumb.  Pt not able to straighten right arm.  Pt is alert and oriented

## 2013-07-11 NOTE — ED Notes (Signed)
Unable to get IV.  Will attempt IV ultrasound.

## 2013-07-11 NOTE — ED Provider Notes (Addendum)
CSN: [id number]     Arrival date & time 07/11/13  1502 History   First MD Initiated Contact with Patient 07/11/13 1534     Chief Complaint  Patient presents with  . Fall  . Stroke Symptoms     (Consider location/radiation/quality/duration/timing/severity/associated sxs/prior Treatment) Patient is a 58 y.o. female presenting with fall. The history is provided by the patient.  Fall Pertinent negatives include no chest pain, no abdominal pain, no headaches and no shortness of breath.  pt s/p syncopal event at home 4 days ago, states awoke on floor, felt fine earlier in day. States ever since then noted right arm weakness, persistent, constant. Denies pain or injury w syncopal event. Today was walking dog and dog ran after a car, pulling pt to ground. Post todays fall, c/o right shoulder and right wrist pain, mod-sever, worse w movement and palpation. w fall hit head, no loc. Abrasions to face. Tetanus utd within past year. Denies neck or back pain. Pt also notes intermittent cp in past week, and reports taking ntg prn. States did not have any cp or take any ntg prior to syncopal event 4 days ago. Compliant w normal meds, no new meds. Denies headache. Non prod cough, which pt states is her normal smokers cough, no other uri c/o. No fever or chills. No abd pain. No nvd. Normal bms, no melena or rectal bleeding. No dysuria or gu c/o.     Past Medical History  Diagnosis Date  . Hypertension   . Normal cardiac stress test 2007   Past Surgical History  Procedure Laterality Date  . Cesarean section    . Curretage for endometriosis  93, 94,95, 96  . Cholecystectomy    . Gyn surgery    . Abdominal hysterectomy    . Ovarian cyst removal    . Hernia repair     Family History  Problem Relation Age of Onset  . Cancer Mother   . Stroke Mother   . COPD Mother   . Dementia Mother   . Diabetes Father   . Heart attack Father    History  Substance Use Topics  . Smoking status: Current Every Day  Smoker -- 1.00 packs/day for 30 years    Types: Cigarettes  . Smokeless tobacco: Former NeurosurgeonUser  . Alcohol Use: Yes     Comment: occasional   OB History   Grav Para Term Preterm Abortions TAB SAB Ect Mult Living                 Review of Systems  Constitutional: Negative for fever and chills.  HENT: Negative for sore throat.   Eyes: Negative for redness.  Respiratory: Positive for cough. Negative for shortness of breath.   Cardiovascular: Negative for chest pain, palpitations and leg swelling.  Gastrointestinal: Negative for vomiting, abdominal pain, diarrhea and blood in stool.  Genitourinary: Negative for dysuria and flank pain.  Musculoskeletal: Negative for back pain and neck pain.  Skin: Negative for rash.  Neurological: Negative for numbness and headaches.  Hematological: Does not bruise/bleed easily.  Psychiatric/Behavioral: Negative for confusion.      Allergies  Amoxicillin and Sulfonamide derivatives  Home Medications   Prior to Admission medications   Medication Sig Start Date End Date Taking? Authorizing Provider  acetaminophen (TYLENOL) 500 MG tablet Take 1,000 mg by mouth every 6 (six) hours as needed (pain).    Historical Provider, MD  albuterol (PROVENTIL HFA;VENTOLIN HFA) 108 (90 BASE) MCG/ACT inhaler Inhale 2 puffs into the  lungs every 4 (four) hours as needed for wheezing or shortness of breath. 02/28/13   Laray AngerKathleen M McManus, DO  nitroGLYCERIN (NITROSTAT) 0.3 MG SL tablet Place 0.3 mg under the tongue every 5 (five) minutes as needed for chest pain.     Historical Provider, MD   BP 158/90  Pulse 117  Temp(Src) 98.5 F (36.9 C) (Oral)  Resp 16  SpO2 93% Physical Exam  Nursing note and vitals reviewed. Constitutional: She is oriented to person, place, and time. She appears well-developed and well-nourished. No distress.  HENT:  Abrasions to face, left cheek. Facial bones orbits appear grossly intact, no deformity. No malocclusion.  Eyes: Conjunctivae  are normal. Pupils are equal, round, and reactive to light. No scleral icterus.  Neck: Neck supple. No tracheal deviation present. No thyromegaly present.  Mild mid cervical tenderness. No bruit  Cardiovascular: Normal rate, regular rhythm, normal heart sounds and intact distal pulses.  Exam reveals no gallop and no friction rub.   No murmur heard. Pulmonary/Chest: Effort normal and breath sounds normal. No respiratory distress. She exhibits no tenderness.  Abdominal: Soft. Normal appearance. She exhibits no distension and no mass. There is no tenderness. There is no rebound and no guarding.  Genitourinary:  No cva tenderness  Musculoskeletal: She exhibits tenderness. She exhibits no edema.  Tenderness right shoulder and right wrist, otherwise good rom bil ext without pain or other focal bony tenderness. Distal pulses palp.  Mid cervical tenderness, otherwise, CTLS spine, non tender, aligned, no step off.   Neurological: She is alert and oriented to person, place, and time. No cranial nerve deficit.  Pt states cannot move R UE.  Will not/unable to move fingers of right hand actively (pt flexes extends them with her left hand).  Reports impaired sens R UE.  L UE, bil LE motor 5/5. sens intact. Steady gait.   Skin: Skin is warm and dry. No rash noted. She is not diaphoretic.  Psychiatric: She has a normal mood and affect.    ED Course  Procedures (including critical care time)   Results for orders placed during the hospital encounter of 07/11/13  CBC      Result Value Ref Range   WBC 11.0 (*) 4.0 - 10.5 K/uL   RBC 4.18  3.87 - 5.11 MIL/uL   Hemoglobin 13.1  12.0 - 15.0 g/dL   HCT 16.137.8  09.636.0 - 04.546.0 %   MCV 90.4  78.0 - 100.0 fL   MCH 31.3  26.0 - 34.0 pg   MCHC 34.7  30.0 - 36.0 g/dL   RDW 40.915.0  81.111.5 - 91.415.5 %   Platelets 199  150 - 400 K/uL  COMPREHENSIVE METABOLIC PANEL      Result Value Ref Range   Sodium 130 (*) 137 - 147 mEq/L   Potassium 4.3  3.7 - 5.3 mEq/L   Chloride 91 (*)  96 - 112 mEq/L   CO2 24  19 - 32 mEq/L   Glucose, Bld 101 (*) 70 - 99 mg/dL   BUN 17  6 - 23 mg/dL   Creatinine, Ser 7.820.70  0.50 - 1.10 mg/dL   Calcium 9.4  8.4 - 95.610.5 mg/dL   Total Protein 7.0  6.0 - 8.3 g/dL   Albumin 4.1  3.5 - 5.2 g/dL   AST 19  0 - 37 U/L   ALT 13  0 - 35 U/L   Alkaline Phosphatase 77  39 - 117 U/L   Total Bilirubin 0.2 (*) 0.3 -  1.2 mg/dL   GFR calc non Af Amer >90  >90 mL/min   GFR calc Af Amer >90  >90 mL/min  TROPONIN I      Result Value Ref Range   Troponin I <0.30  <0.30 ng/mL   Dg Chest 2 View  07/11/2013   CLINICAL DATA:  Fall.  EXAM: CHEST  2 VIEW  COMPARISON:  DG CHEST 2V dated 05/13/2013  FINDINGS: Mediastinum and hilar structures are normal. The lungs are clear of infiltrates. Cardiac structures are unremarkable. No pleural effusion or pneumothorax. Right humeral fractures are present. These are described on the right shoulder series report.  IMPRESSION: 1. No acute cardiopulmonary disease. 2. Right proximal humeral fractures involving the metaphysis and greater tuberosity.   Electronically Signed   By: Maisie Fus  Register   On: 07/11/2013 17:16   Dg Shoulder Right  07/11/2013   CLINICAL DATA:  Fall.  EXAM: RIGHT SHOULDER - 2+ VIEW  COMPARISON:  None.  FINDINGS: Slightly displaced fracture of the proximal right humeral metaphysis is noted. Fracture greater tuberosity present. This is displaced. No evidence of dislocation or separation.  IMPRESSION: Proximal right humeral fractures involving the metaphysis and greater tuberosity. These are slightly displaced.   Electronically Signed   By: Maisie Fus  Register   On: 07/11/2013 17:15   Dg Wrist Complete Right  07/11/2013   CLINICAL DATA:  Syncope. Numbness in the right arm. Slurred speech.  EXAM: RIGHT WRIST - COMPLETE 3+ VIEW  COMPARISON:  None.  FINDINGS: There is no evidence of fracture or dislocation. There is no evidence of arthropathy or other focal bone abnormality. Soft tissues are unremarkable.  IMPRESSION: 1.  No significant abnormality identified. If the patient's tenderness is in the vicinity of the scaphoid/anatomic snuffbox, then cross-sectional imaging or presumptive treatment for occult scaphoid fracture might be considered.   Electronically Signed   By: Herbie Baltimore M.D.   On: 07/11/2013 17:16   Ct Head Wo Contrast  07/11/2013   CLINICAL DATA:  Fall.  EXAM: CT HEAD WITHOUT CONTRAST  CT CERVICAL SPINE WITHOUT CONTRAST  TECHNIQUE: Multidetector CT imaging of the head and cervical spine was performed following the standard protocol without intravenous contrast. Multiplanar CT image reconstructions of the cervical spine were also generated.  COMPARISON:  02/16/2012.  FINDINGS: CT HEAD FINDINGS  No mass. No hydrocephalus. No hemorrhage. Orbits and paranasal sinuses clear. No acute bony abnormalities.  CT CERVICAL SPINE FINDINGS  Soft tissue structures are unremarkable. Pleural parenchymal thickening and apices most consistent with scarring. No evidence of fracture or dislocation. Diffuse degenerative change present.  IMPRESSION: No acute abnormality .   Electronically Signed   By: Maisie Fus  Register   On: 07/11/2013 16:58   Ct Cervical Spine Wo Contrast  07/11/2013   CLINICAL DATA:  Fall.  EXAM: CT HEAD WITHOUT CONTRAST  CT CERVICAL SPINE WITHOUT CONTRAST  TECHNIQUE: Multidetector CT imaging of the head and cervical spine was performed following the standard protocol without intravenous contrast. Multiplanar CT image reconstructions of the cervical spine were also generated.  COMPARISON:  02/16/2012.  FINDINGS: CT HEAD FINDINGS  No mass. No hydrocephalus. No hemorrhage. Orbits and paranasal sinuses clear. No acute bony abnormalities.  CT CERVICAL SPINE FINDINGS  Soft tissue structures are unremarkable. Pleural parenchymal thickening and apices most consistent with scarring. No evidence of fracture or dislocation. Diffuse degenerative change present.  IMPRESSION: No acute abnormality .   Electronically Signed    By: Maisie Fus  Register   On: 07/11/2013 16:58  EKG Interpretation   Date/Time:  Thursday July 11 2013 18:38:01 EDT Ventricular Rate:  95 PR Interval:  147 QRS Duration: 83 QT Interval:  345 QTC Calculation: 434 R Axis:   81 Text Interpretation:  Sinus rhythm Nonspecific T abnrm, anterolateral  leads Confirmed by Denton Lank  MD, Caryn Bee ([id number]) on 07/11/2013 6:42:21 PM      MDM  Iv ns. Labs.   Xray.  Dilaudid iv, zofran iv.  Reviewed nursing notes and prior charts for additional history.   Friend now present, additional hx, states w syncopal event a few days ago, feels pt laid on ground in same position, ?how long, pt unsure, but states post that event unable to move right arm and hand, numbness to arm/hand. These symptoms have persisted until know, no change since fall.  Shoulder pain persists. Dilaudid iv, shoulder immobilizer, icepack.  Recheck radial pulse 2+.   Given syncopal event, will admit.  Also will need further eval right arm numbness/weakness (todays head ct neg) ?prolonged nerve compression w prior syncopal event vs other process.   Discussed w Dr Algie Coffer - he will admit.  Ortho paged re RUE injury/prox humerus fx - discussed w Dr Roda Shutters - he indicates he will consult, and see pt in hospital in morning, for now, shoulder immobilizer.        Suzi Roots, MD 07/11/13 Barry Brunner

## 2013-07-11 NOTE — ED Notes (Signed)
Per Dr. Algie CofferKadakia, pt is fine to go upstairs once her systolic BP reaches 90.

## 2013-07-11 NOTE — ED Notes (Addendum)
Pt does not want to stay at this time.MD Algie CofferKadakia made aware.

## 2013-07-12 ENCOUNTER — Observation Stay (HOSPITAL_COMMUNITY): Payer: Medicare Other

## 2013-07-12 DIAGNOSIS — S42309A Unspecified fracture of shaft of humerus, unspecified arm, initial encounter for closed fracture: Secondary | ICD-10-CM | POA: Diagnosis not present

## 2013-07-12 DIAGNOSIS — E440 Moderate protein-calorie malnutrition: Secondary | ICD-10-CM | POA: Insufficient documentation

## 2013-07-12 DIAGNOSIS — S42253A Displaced fracture of greater tuberosity of unspecified humerus, initial encounter for closed fracture: Secondary | ICD-10-CM | POA: Diagnosis not present

## 2013-07-12 DIAGNOSIS — R55 Syncope and collapse: Secondary | ICD-10-CM | POA: Diagnosis not present

## 2013-07-12 DIAGNOSIS — F17200 Nicotine dependence, unspecified, uncomplicated: Secondary | ICD-10-CM | POA: Diagnosis not present

## 2013-07-12 LAB — TSH: TSH: 1.84 u[IU]/mL (ref 0.350–4.500)

## 2013-07-12 LAB — BASIC METABOLIC PANEL
BUN: 11 mg/dL (ref 6–23)
CO2: 22 meq/L (ref 19–32)
Calcium: 8.8 mg/dL (ref 8.4–10.5)
Chloride: 100 mEq/L (ref 96–112)
Creatinine, Ser: 0.53 mg/dL (ref 0.50–1.10)
GFR calc non Af Amer: >90 mL/min (ref >90)
Glucose, Bld: 112 mg/dL — ABNORMAL HIGH (ref 70–99)
Potassium: 4.9 mEq/L (ref 3.7–5.3)
SODIUM: 133 meq/L — AB (ref 137–147)

## 2013-07-12 LAB — FERRITIN: FERRITIN: 114 ng/mL (ref 10–291)

## 2013-07-12 LAB — CBC
HCT: 33.6 % — ABNORMAL LOW (ref 36.0–46.0)
Hemoglobin: 11.5 g/dL — ABNORMAL LOW (ref 12.0–15.0)
MCH: 31.1 pg (ref 26.0–34.0)
MCHC: 34.2 g/dL (ref 30.0–36.0)
MCV: 90.8 fL (ref 78.0–100.0)
Platelets: 175 10*3/uL (ref 150–400)
RBC: 3.7 MIL/uL — ABNORMAL LOW (ref 3.87–5.11)
RDW: 15 % (ref 11.5–15.5)
WBC: 6.7 10*3/uL (ref 4.0–10.5)

## 2013-07-12 LAB — IRON AND TIBC
Iron: 34 ug/dL — ABNORMAL LOW (ref 42–135)
Saturation Ratios: 12 % — ABNORMAL LOW (ref 20–55)
TIBC: 286 ug/dL (ref 250–470)
UIBC: 252 ug/dL (ref 125–400)

## 2013-07-12 MED ORDER — DEXTROSE-NACL 5-0.45 % IV SOLN
INTRAVENOUS | Status: DC
  Administered 2013-07-12: 01:00:00 via INTRAVENOUS

## 2013-07-12 MED ORDER — ACETAMINOPHEN 325 MG PO TABS
650.0000 mg | ORAL_TABLET | Freq: Four times a day (QID) | ORAL | Status: DC | PRN
  Administered 2013-07-12: 650 mg via ORAL
  Filled 2013-07-12: qty 2

## 2013-07-12 MED ORDER — HYDROMORPHONE HCL PF 1 MG/ML IJ SOLN
0.5000 mg | Freq: Once | INTRAMUSCULAR | Status: AC
  Administered 2013-07-12: 0.5 mg via INTRAVENOUS
  Filled 2013-07-12: qty 1

## 2013-07-12 MED ORDER — ACETAMINOPHEN 500 MG PO TABS: 500.0000 mg | ORAL_TABLET | Freq: Four times a day (QID) | ORAL | Status: DC | PRN

## 2013-07-12 MED ORDER — SODIUM CHLORIDE 0.9 % IJ SOLN
3.0000 mL | Freq: Two times a day (BID) | INTRAMUSCULAR | Status: DC
  Administered 2013-07-12: 3 mL via INTRAVENOUS

## 2013-07-12 MED ORDER — OXYCODONE HCL 5 MG PO TABS: 5.0000 mg | ORAL_TABLET | Freq: Four times a day (QID) | ORAL | Status: DC | PRN

## 2013-07-12 MED ORDER — OXYCODONE HCL 5 MG PO TABS
5.0000 mg | ORAL_TABLET | ORAL | Status: DC | PRN
  Administered 2013-07-12 (×5): 5 mg via ORAL
  Filled 2013-07-12 (×5): qty 1

## 2013-07-12 MED ORDER — BOOST PLUS PO LIQD: 237.0000 mL | Freq: Two times a day (BID) | ORAL | Status: DC

## 2013-07-12 MED ORDER — ACETAMINOPHEN 650 MG RE SUPP: 650.0000 mg | Freq: Four times a day (QID) | RECTAL | Status: DC | PRN

## 2013-07-12 MED ORDER — BOOST PLUS PO LIQD
237.0000 mL | Freq: Three times a day (TID) | ORAL | Status: DC
  Administered 2013-07-12: 237 mL via ORAL
  Filled 2013-07-12 (×2): qty 237

## 2013-07-12 MED ORDER — SODIUM CHLORIDE 0.9 % IJ SOLN: 3.0000 mL | INTRAMUSCULAR | Status: DC | PRN

## 2013-07-12 MED ORDER — SODIUM CHLORIDE 0.9 % IV SOLN: 250.0000 mL | INTRAVENOUS | Status: DC | PRN

## 2013-07-12 MED ORDER — HEPARIN SODIUM (PORCINE) 5000 UNIT/ML IJ SOLN
5000.0000 [IU] | Freq: Three times a day (TID) | INTRAMUSCULAR | Status: DC
  Administered 2013-07-12 (×2): 5000 [IU] via SUBCUTANEOUS
  Filled 2013-07-12 (×5): qty 1

## 2013-07-12 MED ORDER — ADULT MULTIVITAMIN W/MINERALS CH
1.0000 | ORAL_TABLET | Freq: Every day | ORAL | Status: DC
  Administered 2013-07-12: 1 via ORAL
  Filled 2013-07-12: qty 1

## 2013-07-12 MED ORDER — SODIUM CHLORIDE 0.9 % IJ SOLN: 3.0000 mL | Freq: Two times a day (BID) | INTRAMUSCULAR | Status: DC

## 2013-07-12 NOTE — Progress Notes (Signed)
RefNani Gasser: METHENEY,CATHERINE, MD   Subjective:  Feeling weak. Conservative treatment per Dr. Judie PetitM. [place]/Orthopedics  Objective:  Vital Signs in the last 24 hours: Temp:  [98.1 F (36.7 C)-98.5 F (36.9 C)] 98.2 F (36.8 C) (04/24 0420) Pulse Rate:  [80-117] 83 (04/24 1014) Cardiac Rhythm:  [-] Normal sinus rhythm (04/24 0726) Resp:  [16-21] 16 (04/24 1014) BP: (76-158)/(44-90) 97/58 mmHg (04/24 1014) SpO2:  [93 %-100 %] 100 % (04/24 1014) Weight:  [41.9 kg (92 lb 6 oz)] 41.9 kg (92 lb 6 oz) (04/24 0022)  Physical Exam: BP Readings from Last 1 Encounters:  07/12/13 97/58    Wt Readings from Last 1 Encounters:  07/12/13 41.9 kg (92 lb 6 oz)    Weight change:   HEENT: [place]/AT, Eyes- PERL, EOMI, Conjunctiva-Pink, Sclera-Non-icteric Neck: No JVD, No bruit, Trachea midline. Lungs:  Clear, Bilateral. Cardiac:  Regular rhythm, normal S1 and S2, no S3.  Abdomen:  Soft, non-tender. Extremities:  No edema present. No cyanosis. No clubbing. Right shoulder and upper arm tender with limited motion. Has sling for use. CNS: AxOx3, Cranial nerves grossly intact. Right handed. Skin: Warm and dry.   Intake/Output from previous day: 04/23 0701 - 04/24 0700 In: 192 [I.V.:192] Out: 650 [Urine:650]    Lab Results: BMET    Component Value Date/Time   NA 133* 07/12/2013 0402   NA 130* 07/11/2013 1607   NA 138 02/28/2013 1218   K 4.9 07/12/2013 0402   K 4.3 07/11/2013 1607   K 3.5 02/28/2013 1218   CL 100 07/12/2013 0402   CL 91* 07/11/2013 1607   CL 102 02/28/2013 1218   CO2 22 07/12/2013 0402   CO2 24 07/11/2013 1607   CO2 22 02/28/2013 1218   GLUCOSE 112* 07/12/2013 0402   GLUCOSE 101* 07/11/2013 1607   GLUCOSE 100* 02/28/2013 1218   BUN 11 07/12/2013 0402   BUN 17 07/11/2013 1607   BUN 7 02/28/2013 1218   CREATININE 0.53 07/12/2013 0402   CREATININE 0.70 07/11/2013 1607   CREATININE 0.59 02/28/2013 1218   CREATININE 0.63 01/18/2011 1053   CALCIUM 8.8 07/12/2013 0402   CALCIUM 9.4 07/11/2013  1607   CALCIUM 9.2 02/28/2013 1218   GFRNONAA >90 07/12/2013 0402   GFRNONAA >90 07/11/2013 1607   GFRNONAA >90 02/28/2013 1218   GFRNONAA >90 01/18/2011 1053   GFRAA >90 07/12/2013 0402   GFRAA >90 07/11/2013 1607   GFRAA >90 02/28/2013 1218   GFRAA >90 01/18/2011 1053   CBC    Component Value Date/Time   WBC 6.7 07/12/2013 0402   RBC 3.70* 07/12/2013 0402   HGB 11.5* 07/12/2013 0402   HCT 33.6* 07/12/2013 0402   PLT 175 07/12/2013 0402   MCV 90.8 07/12/2013 0402   MCH 31.1 07/12/2013 0402   MCHC 34.2 07/12/2013 0402   RDW 15.0 07/12/2013 0402   LYMPHSABS 1.4 02/28/2013 1218   MONOABS 0.4 02/28/2013 1218   EOSABS 0.0 02/28/2013 1218   BASOSABS 0.0 02/28/2013 1218   HEPATIC Function Panel  Recent Labs  02/28/13 1218 07/11/13 1607  PROT 7.0 7.0   HEMOGLOBIN A1C No components found with this basename: HGA1C,  MPG   CARDIAC ENZYMES Lab Results  Component Value Date   TROPONINI <0.30 07/11/2013   TROPONINI <0.30 02/28/2013   BNP No results found for this basename: PROBNP,  in the last 8760 hours TSH No results found for this basename: TSH,  in the last 8760 hours CHOLESTEROL No results found for this basename: CHOL,  in the last 8760 hours  Scheduled Meds: . heparin  5,000 Units Subcutaneous 3 times per day  . irbesartan  150 mg Oral Daily  . memantine  5 mg Oral Daily  . multivitamin with minerals  1 tablet Oral Daily  . nicotine  21 mg Transdermal Daily  . sodium chloride  3 mL Intravenous Q12H  . sodium chloride  3 mL Intravenous Q12H   Continuous Infusions: . dextrose 5 % and 0.45% NaCl 30 mL/hr at 07/12/13 0036   PRN Meds:.sodium chloride, acetaminophen, acetaminophen, ALPRAZolam, oxyCODONE, sodium chloride  Assessment/Plan:  Right humerus fracture  H/O Syncope  Tobacco use disorder  R/O stroke  COPD  Coronary artery disease  Hypertension Hyponatremia  Increase diet and activity with PT evaluation and treatment   LOS: 1 day    Orpah CobbAjay Elizabella Nolet  MD   07/12/2013, 10:46 AM

## 2013-07-12 NOTE — Consult Note (Signed)
ORTHOPAEDIC CONSULTATION  REQUESTING PHYSICIAN: Ricki RodriguezAjay S Kadakia, MD  Chief Complaint: right shoulder pain  HPI: Nickolas Madriderry C Martin is a 58 y.o. female who complains of right shoulder pain s/p fall Sunday and then again yesterday when her dog pulled her.  She had a syncopal episode on Sunday which she is admitted for.  She endorses pain in the shoulder and elbow.  She has abrasions to forehead.  Denies LOC, neck pain.  Past Medical History  Diagnosis Date  . Hypertension   . Normal cardiac stress test 2007   Past Surgical History  Procedure Laterality Date  . Cesarean section    . Curretage for endometriosis  93, 94,95, 96  . Cholecystectomy    . Gyn surgery    . Abdominal hysterectomy    . Ovarian cyst removal    . Hernia repair     History   Social History  . Marital Status: Divorced    Spouse Name: N/A    Number of Children: N/A  . Years of Education: N/A   Social History Main Topics  . Smoking status: Current Every Day Smoker -- 1.00 packs/day for 30 years    Types: Cigarettes  . Smokeless tobacco: Former NeurosurgeonUser  . Alcohol Use: Yes     Comment: occasional  . Drug Use: No  . Sexual Activity: None     Comment: disabled, `1 yr college, married.    Other Topics Concern  . None   Social History Narrative  . None   Family History  Problem Relation Age of Onset  . Cancer Mother   . Stroke Mother   . COPD Mother   . Dementia Mother   . Diabetes Father   . Heart attack Father    Allergies  Allergen Reactions  . Amoxicillin Hives    whelps  . Sulfonamide Derivatives Nausea And Vomiting   Prior to Admission medications   Medication Sig Start Date End Date Taking? Authorizing Provider  acetaminophen (TYLENOL) 500 MG tablet Take 1,000 mg by mouth every 6 (six) hours as needed for moderate pain (back pain).   Yes Historical Provider, MD  memantine (NAMENDA) 5 MG tablet Take 5 mg by mouth daily.   Yes Historical Provider, MD  nitroGLYCERIN (NITROSTAT) 0.3 MG SL  tablet Place 0.3 mg under the tongue every 5 (five) minutes as needed for chest pain.    Yes Historical Provider, MD  olmesartan (BENICAR) 20 MG tablet Take 20 mg by mouth daily.   Yes Historical Provider, MD   Dg Chest 2 View  07/11/2013   CLINICAL DATA:  Fall.  EXAM: CHEST  2 VIEW  COMPARISON:  DG CHEST 2V dated 05/13/2013  FINDINGS: Mediastinum and hilar structures are normal. The lungs are clear of infiltrates. Cardiac structures are unremarkable. No pleural effusion or pneumothorax. Right humeral fractures are present. These are described on the right shoulder series report.  IMPRESSION: 1. No acute cardiopulmonary disease. 2. Right proximal humeral fractures involving the metaphysis and greater tuberosity.   Electronically Signed   By: Maisie Fushomas  Register   On: 07/11/2013 17:16   Dg Shoulder Right  07/11/2013   CLINICAL DATA:  Fall.  EXAM: RIGHT SHOULDER - 2+ VIEW  COMPARISON:  None.  FINDINGS: Slightly displaced fracture of the proximal right humeral metaphysis is noted. Fracture greater tuberosity present. This is displaced. No evidence of dislocation or separation.  IMPRESSION: Proximal right humeral fractures involving the metaphysis and greater tuberosity. These are slightly displaced.   Electronically Signed  ByMaisie Fus: Thomas  Register   On: 07/11/2013 17:15   Dg Wrist Complete Right  07/11/2013   CLINICAL DATA:  Syncope. Numbness in the right arm. Slurred speech.  EXAM: RIGHT WRIST - COMPLETE 3+ VIEW  COMPARISON:  None.  FINDINGS: There is no evidence of fracture or dislocation. There is no evidence of arthropathy or other focal bone abnormality. Soft tissues are unremarkable.  IMPRESSION: 1. No significant abnormality identified. If the patient's tenderness is in the vicinity of the scaphoid/anatomic snuffbox, then cross-sectional imaging or presumptive treatment for occult scaphoid fracture might be considered.   Electronically Signed   By: Herbie BaltimoreWalt  Liebkemann M.D.   On: 07/11/2013 17:16   Ct Head Wo  Contrast  07/11/2013   CLINICAL DATA:  Fall.  EXAM: CT HEAD WITHOUT CONTRAST  CT CERVICAL SPINE WITHOUT CONTRAST  TECHNIQUE: Multidetector CT imaging of the head and cervical spine was performed following the standard protocol without intravenous contrast. Multiplanar CT image reconstructions of the cervical spine were also generated.  COMPARISON:  02/16/2012.  FINDINGS: CT HEAD FINDINGS  No mass. No hydrocephalus. No hemorrhage. Orbits and paranasal sinuses clear. No acute bony abnormalities.  CT CERVICAL SPINE FINDINGS  Soft tissue structures are unremarkable. Pleural parenchymal thickening and apices most consistent with scarring. No evidence of fracture or dislocation. Diffuse degenerative change present.  IMPRESSION: No acute abnormality .   Electronically Signed   By: Maisie Fushomas  Register   On: 07/11/2013 16:58   Ct Cervical Spine Wo Contrast  07/11/2013   CLINICAL DATA:  Fall.  EXAM: CT HEAD WITHOUT CONTRAST  CT CERVICAL SPINE WITHOUT CONTRAST  TECHNIQUE: Multidetector CT imaging of the head and cervical spine was performed following the standard protocol without intravenous contrast. Multiplanar CT image reconstructions of the cervical spine were also generated.  COMPARISON:  02/16/2012.  FINDINGS: CT HEAD FINDINGS  No mass. No hydrocephalus. No hemorrhage. Orbits and paranasal sinuses clear. No acute bony abnormalities.  CT CERVICAL SPINE FINDINGS  Soft tissue structures are unremarkable. Pleural parenchymal thickening and apices most consistent with scarring. No evidence of fracture or dislocation. Diffuse degenerative change present.  IMPRESSION: No acute abnormality .   Electronically Signed   By: Maisie Fushomas  Register   On: 07/11/2013 16:58    Positive ROS: All other systems have been reviewed and were otherwise negative with the exception of those mentioned in the HPI and as above.  Physical Exam: General: Alert, no acute distress Cardiovascular: No pedal edema Respiratory: No cyanosis, no use of  accessory musculature GI: No organomegaly, abdomen is soft and non-tender Skin: No lesions in the area of chief complaint Neurologic: Sensation intact distally Psychiatric: Patient is competent for consent with normal mood and affect Lymphatic: No axillary or cervical lymphadenopathy  MUSCULOSKELETAL:  - painful ROM of shoulder and elbow - TTP of shoulder and elbow - NVI, fingers wwp  Assessment: Right proximal humerus fx  Plan: - plan to treat nonop in sling - dedicated elbow xrays ordered due to pain, r/o fx - will follow up on xrays - f/u in office 2 weeks  Thank you for the consult and the opportunity to see Ms. Jesse FallMartin  N. Glee ArvinMichael Ehab Humber, MD [facility] [phone] 7:42 AM

## 2013-07-12 NOTE — Evaluation (Signed)
Physical Therapy Evaluation/Discharge from PT Patient Details Name: Brittany [name] MRN: [id number] DOB: April 30, 1955 Today's Date: 07/12/2013   History of Present Illness  58 yo female admitted with syncope, fall, R humerus fx.   Clinical Impression  On eval, pt was supervision level assist for mobility-able to ambulate ~275 without assistive device and ascend/descend 10 steps with use of rail. No LOB. Pt denied lightheadedness/dizziness. Educated pt on importance of use of sling for R UE (at all times), however pt stated she feels she can wear it whenever she wants. Pt politely declined HHPT/HHOT. No further PT needs. 1x eval. Will sign off    Follow Up Recommendations No PT follow up (Offered HHOT/HHPT-pt declined)    Equipment Recommendations  None recommended by PT    Recommendations for Other Services       Precautions / Restrictions Precautions Precautions: None;Shoulder Shoulder Interventions: Shoulder sling/immobilizer;At all times Precaution Comments: Educated pt on importance of wearing sling R UE. Pt stated she figures she can wear it "when I want." Restrictions Weight Bearing Restrictions: Yes RUE Weight Bearing: Non weight bearing      Mobility  Bed Mobility Overal bed mobility: Modified Independent                Transfers Overall transfer level: Modified independent                  Ambulation/Gait Ambulation/Gait assistance: Supervision Ambulation Distance (Feet): 250 Feet Assistive device: None Gait Pattern/deviations: WFL(Within Functional Limits)        Stairs Stairs: Yes Stairs assistance: Supervision Stair Management: One rail Left Number of Stairs: 8    Wheelchair Mobility    Modified Rankin (Stroke Patients Only)       Balance Overall balance assessment: History of Falls;No apparent balance deficits (not formally assessed)                                           Pertinent Vitals/Pain R UE-5/10.  Pt keeps arm braced against upper torso-doesn't allow sling to support extremity    Home Living Family/patient expects to be discharged to:: Private residence Living Arrangements: Alone   Type of Home: Apartment Home Access: Stairs to enter Entrance Stairs-Rails: Right Entrance Stairs-Number of Steps: 8 Home Layout: One level Home Equipment: Crutches      Prior Function Level of Independence: Independent               Hand Dominance        Extremity/Trunk Assessment   Upper Extremity Assessment: RUE deficits/detail   RUE: Unable to fully assess due to immobilization       Lower Extremity Assessment: Overall WFL for tasks assessed      Cervical / Trunk Assessment: Normal  Communication   Communication: No difficulties  Cognition Arousal/Alertness: Awake/alert Behavior During Therapy: WFL for tasks assessed/performed Overall Cognitive Status: Within Functional Limits for tasks assessed                      General Comments      Exercises        Assessment/Plan    PT Assessment Patent does not need any further PT services  PT Diagnosis     PT Problem List    PT Treatment Interventions     PT Goals (Current goals can be found in the Care Plan section) Acute Rehab  PT Goals Patient Stated Goal: home PT Goal Formulation: No goals set, d/c therapy    Frequency     Barriers to discharge        Co-evaluation               End of Session Equipment Utilized During Treatment: Gait belt Activity Tolerance: Patient tolerated treatment well Patient left: in bed;with call bell/phone within reach      Functional Assessment Tool Used: clinical judgement Functional Limitation: Mobility: Walking and moving around Mobility: Walking and Moving Around Current Status ([id number]([id number]): At least 1 percent but less than 20 percent impaired, limited or restricted Mobility: Walking and Moving Around Goal Status [phone]): At least 1 percent but less than 20  percent impaired, limited or restricted Mobility: Walking and Moving Around Discharge Status [phone](G8980): At least 1 percent but less than 20 percent impaired, limited or restricted    Time: 1441-1453 PT Time Calculation (min): 12 min   Charges:   PT Evaluation $Initial PT Evaluation Tier I: 1 Procedure PT Treatments $Gait Training: 8-22 mins   PT G Codes:   Functional Assessment Tool Used: clinical judgement Functional Limitation: Mobility: Walking and moving around    R.R. DonnelleyJannie Estanislado Surgeon, MPT Pager: [phone]

## 2013-07-12 NOTE — Discharge Summary (Signed)
Physician Discharge Summary  Patient ID: Brittany [name] MRN: [id number] DOB/AGE: [birth year] 58 y.o.  Admit date: 07/11/2013 Discharge date: 07/12/2013  Admission Diagnoses: Right humerus fracture  H/O Syncope  Tobacco use disorder  R/O stroke  COPD  Coronary artery disease  Hypertension  Hyponatremia  Discharge Diagnoses:  Principle Problem: * Right humerus fracture * Syncope Malnutrition of moderate degree  Tobacco use disorder   COPD  Coronary artery disease  Hypertension  Hyponatremia   Discharged Condition: fair  Hospital Course: 58 year old patient with 90 pound weight was walking her 65 lb dog and dog ran after a cat, pulling pt to ground resulting in proximal humeral metaphysial slightly displaced fracture. Initially patient refused hospital admission, then agreed to stay overnight till checked out by Orthopedic doctor in AM. Home PT was offered and skilled nursing home was offered but patient declined. Analgesics were given. Her hyponatremia improved with saline bolus. Her TSH and Iron level are pending at the time of discharge. She had nutritional evaluation and prescription of boost plus was given to improve her moderate degree of malnutrition. She will be followed by primary care and orthopedic doctor-M. Naiping, MD in 2 weeks and by me in 1 week.   Consults: orthopedic surgery  Significant Diagnostic Studies: labs: Mildly low Hgb of 11.5. Normal WBC and Platelets count. EKG normal sinus rhythm.  CXR/Right shoulder X-ray:  1. No acute cardiopulmonary disease. 2. Right proximal humeral fractures involving the metaphysis and greater tuberosity.    CT head and C spine without acute abnormality.  Treatments: analgesia: Dilaudid and sling.  Discharge Exam: Blood pressure 120/69, pulse 89, temperature 98.4 F (36.9 C), temperature source Oral, resp. rate 16, height 5\' 2"  (1.575 m), weight 41.9 kg (92 lb 6 oz), SpO2 100.00%. HEENT: [place]/AT, Eyes- PERL, EOMI,  Conjunctiva-Pink, Sclera-Non-icteric  Neck: No JVD, No bruit, Trachea midline.  Lungs: Clear, Bilateral.  Cardiac: Regular rhythm, normal S1 and S2, no S3.  Abdomen: Soft, non-tender.  Extremities: No edema present. No cyanosis. No clubbing. Right shoulder and upper arm tender with limited motion. Has sling for use.  CNS: AxOx3, Cranial nerves grossly intact. Right handed.  Skin: Warm and dry.  Disposition: 01-Home or Self Care     Medication List         acetaminophen 500 MG tablet  Commonly known as:  TYLENOL  Take 1 tablet (500 mg total) by mouth every 6 (six) hours as needed for moderate pain (back pain).     lactose free nutrition Liqd  Take 237 mLs by mouth 2 (two) times daily between meals.     memantine 5 MG tablet  Commonly known as:  NAMENDA  Take 5 mg by mouth daily.     nitroGLYCERIN 0.3 MG SL tablet  Commonly known as:  NITROSTAT  Place 0.3 mg under the tongue every 5 (five) minutes as needed for chest pain.     olmesartan 20 MG tablet  Commonly known as:  BENICAR  Take 20 mg by mouth daily.     oxyCODONE 5 MG immediate release tablet  Commonly known as:  Oxy IR/ROXICODONE  Take 1 tablet (5 mg total) by mouth every 6 (six) hours as needed for moderate pain.           Follow-up Information   Follow up with Cheral AlmasXu, Naiping Michael, MD In 2 weeks.   Specialty:  Orthopedic Surgery   Contact information:   [address] [place] [phone]  Follow up with METHENEY,CATHERINE, MD In 2 weeks.   Specialty:  Family Medicine   Contact information:   [address] [place] [address] [phone]       [facility]Rosais[facility] Jamroz S, MD. Schedule an appointment as soon as possible for a visit in 1 week.   Specialty:  Cardiology   Contact information:   [address] [place] [address] [phone]       Signed: Ricki Rodriguezjay S Josie Burleigh 07/12/2013, 5:53 PM

## 2013-07-12 NOTE — Progress Notes (Signed)
UR completed 

## 2013-07-12 NOTE — Progress Notes (Signed)
INITIAL NUTRITION ASSESSMENT  DOCUMENTATION CODES Per approved criteria  -Non-severe (moderate) malnutrition in the context of social cirumstances -Underweight  Pt meets criteria for moderate MALNUTRITION in the context of social/environmental circumstances as evidenced by PO intake <75% for > one month, moderate muscle wasting and subcutaneous fat loss.   INTERVENTION: -Recommend Boost Plus TID -Consider diet liberalization to promote appetite -Encouraged intake of protein/kcal snakes and/or supplements for weight maintenance -Will continue to monitor  NUTRITION DIAGNOSIS: Increased nutrient needs (protein/kcal) related to increased demand for nutrients as evidenced by BMI <18.5, hx of unintentional wt loss.   Goal: Pt to meet >/= 90% of their estimated nutrition needs    Monitor:  Total protein/energy intake, labs, weights  Reason for Assessment: MST/underweight BMI  58 y.o. female  Admitting Dx: <principal problem not specified>  ASSESSMENT: 58 year old patient with syncopal event with weakness and speech disturbance at home 4 days ago has right arm weakness and numbness without chest pain, nausea or vomiting or shortness of breath  -Pt reported unintentional wt loss of 40 lbs over one year period d/t several life stressors. Has recently lost 3 lbs d/t stressful interactions with her daughter. Pt trying to maintain weight around 93 lbs -Diet recall indicates pt skips breakfast, and consumes softer foods d/t new dentition. Will go without eating for prolonged period of time when pt is stressed. -Drinks Boost occasionally -Current PO intake approximately 50% mostly d/t dislike of diet restriction.  -Pt noted feeling very weak and noticed areas of muscle loss Nutrition Focused Physical Exam:  Subcutaneous Fat:  Orbital Region: WDL Upper Arm Region: severe Thoracic and Lumbar Region: moderate  Muscle:  Temple Region: WDL Clavicle Bone Region: moderate Clavicle and  Acromion Bone Region: moderate Scapular Bone Region: WDL Dorsal Hand: WDL Patellar Region: moderate Anterior Thigh Region: moderate Posterior Calf Region: moderate  Edema: none present    Height: Ht Readings from Last 1 Encounters:  07/12/13 5\' 2"  (1.575 m)    Weight: Wt Readings from Last 1 Encounters:  07/12/13 92 lb 6 oz (41.9 kg)    Ideal Body Weight: 110 lbs  % Ideal Body Weight: 84%  Wt Readings from Last 10 Encounters:  07/12/13 92 lb 6 oz (41.9 kg)  05/28/13 87 lb (39.463 kg)  01/18/11 103 lb (46.72 kg)  05/07/10 112 lb (50.803 kg)  07/01/09 126 lb (57.153 kg)  06/24/09 124 lb (56.246 kg)  06/22/09 132 lb (59.875 kg)   Usual Body Weight: 93 lbs  % Usual Body Weight: 100%  BMI:  Body mass index is 16.89 kg/(m^2).Underweight  Estimated Nutritional Needs: Kcal: 1250-1450 Protein: 50-65 Fluid: >/=1500 ml/daily  Skin: WDL  Diet Order: Cardiac  EDUCATION NEEDS: -No education needs identified at this time   Intake/Output Summary (Last 24 hours) at 07/12/13 1553 Last data filed at 07/12/13 1300  Gross per 24 hour  Intake    612 ml  Output   1150 ml  Net   -538 ml    Last BM: 4/23   Labs:   Recent Labs Lab 07/11/13 1607 07/12/13 0402  NA 130* 133*  K 4.3 4.9  CL 91* 100  CO2 24 22  BUN 17 11  CREATININE 0.70 0.53  CALCIUM 9.4 8.8  GLUCOSE 101* 112*    CBG (last 3)  No results found for this basename: GLUCAP,  in the last 72 hours  Scheduled Meds: . heparin  5,000 Units Subcutaneous 3 times per day  . irbesartan  150 mg  Oral Daily  . lactose free nutrition  237 mL Oral TID BM  . memantine  5 mg Oral Daily  . multivitamin with minerals  1 tablet Oral Daily  . nicotine  21 mg Transdermal Daily  . sodium chloride  3 mL Intravenous Q12H  . sodium chloride  3 mL Intravenous Q12H    Continuous Infusions: . dextrose 5 % and 0.45% NaCl 30 mL/hr at 07/12/13 0036    Past Medical History  Diagnosis Date  . Hypertension   . Normal  cardiac stress test 2007    Past Surgical History  Procedure Laterality Date  . Cesarean section    . Curretage for endometriosis  93, 94,95, 96  . Cholecystectomy    . Gyn surgery    . Abdominal hysterectomy    . Ovarian cyst removal    . Hernia repair      Lloyd HugerSarah F Jaymason Ledesma MS RD LDN Clinical Dietitian [phone]

## 2013-10-26 ENCOUNTER — Encounter (HOSPITAL_COMMUNITY): Payer: Self-pay | Admitting: Emergency Medicine

## 2013-10-26 ENCOUNTER — Emergency Department (HOSPITAL_COMMUNITY): Payer: Medicare Other

## 2013-10-26 ENCOUNTER — Emergency Department (HOSPITAL_COMMUNITY)
Admission: EM | Admit: 2013-10-26 | Discharge: 2013-10-26 | Disposition: A | Discharge status: Home / Self Care | Payer: Medicare Other | Attending: Emergency Medicine | Admitting: Emergency Medicine

## 2013-10-26 DIAGNOSIS — R0789 Other chest pain: Secondary | ICD-10-CM | POA: Insufficient documentation

## 2013-10-26 DIAGNOSIS — F17200 Nicotine dependence, unspecified, uncomplicated: Secondary | ICD-10-CM | POA: Diagnosis not present

## 2013-10-26 DIAGNOSIS — Z880 Allergy status to penicillin: Secondary | ICD-10-CM | POA: Diagnosis not present

## 2013-10-26 DIAGNOSIS — G8929 Other chronic pain: Secondary | ICD-10-CM | POA: Diagnosis not present

## 2013-10-26 DIAGNOSIS — G309 Alzheimer's disease, unspecified: Secondary | ICD-10-CM | POA: Diagnosis not present

## 2013-10-26 DIAGNOSIS — R079 Chest pain, unspecified: Secondary | ICD-10-CM | POA: Diagnosis present

## 2013-10-26 DIAGNOSIS — F0280 Dementia in other diseases classified elsewhere without behavioral disturbance: Secondary | ICD-10-CM | POA: Diagnosis not present

## 2013-10-26 DIAGNOSIS — Z79899 Other long term (current) drug therapy: Secondary | ICD-10-CM | POA: Insufficient documentation

## 2013-10-26 DIAGNOSIS — I10 Essential (primary) hypertension: Secondary | ICD-10-CM | POA: Insufficient documentation

## 2013-10-26 HISTORY — DX: Alzheimer's disease, unspecified: G30.9

## 2013-10-26 HISTORY — DX: Dementia in other diseases classified elsewhere, unspecified severity, without behavioral disturbance, psychotic disturbance, mood disturbance, and anxiety: F02.80

## 2013-10-26 LAB — CBC
HCT: 43.5 % (ref 36.0–46.0)
Hemoglobin: 15.5 g/dL — ABNORMAL HIGH (ref 12.0–15.0)
MCH: 32.6 pg (ref 26.0–34.0)
MCHC: 35.6 g/dL (ref 30.0–36.0)
MCV: 91.6 fL (ref 78.0–100.0)
Platelets: 171 10*3/uL (ref 150–400)
RBC: 4.75 MIL/uL (ref 3.87–5.11)
RDW: 12.8 % (ref 11.5–15.5)
WBC: 5.8 10*3/uL (ref 4.0–10.5)

## 2013-10-26 LAB — BASIC METABOLIC PANEL
Anion gap: 16 — ABNORMAL HIGH (ref 5–15)
BUN: 7 mg/dL (ref 6–23)
CO2: 22 mEq/L (ref 19–32)
Calcium: 9.6 mg/dL (ref 8.4–10.5)
Chloride: 99 mEq/L (ref 96–112)
Creatinine, Ser: 0.61 mg/dL (ref 0.50–1.10)
GFR calc Af Amer: >90 mL/min (ref >90)
GFR calc non Af Amer: >90 mL/min (ref >90)
Glucose, Bld: 143 mg/dL — ABNORMAL HIGH (ref 70–99)
Potassium: 3.8 mEq/L (ref 3.7–5.3)
Sodium: 137 mEq/L (ref 137–147)

## 2013-10-26 LAB — I-STAT TROPONIN, ED: Troponin i, poc: 0 ng/mL (ref 0.00–0.08)

## 2013-10-26 NOTE — Discharge Instructions (Signed)
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
## 2014-04-29 ENCOUNTER — Emergency Department (HOSPITAL_COMMUNITY): Payer: Medicare Other

## 2014-04-29 ENCOUNTER — Encounter (HOSPITAL_COMMUNITY): Payer: Self-pay

## 2014-04-29 ENCOUNTER — Emergency Department (HOSPITAL_COMMUNITY)
Admission: EM | Admit: 2014-04-29 | Discharge: 2014-04-29 | Disposition: A | Discharge status: Home / Self Care | Payer: Medicare Other | Attending: Emergency Medicine | Admitting: Emergency Medicine

## 2014-04-29 DIAGNOSIS — J209 Acute bronchitis, unspecified: Secondary | ICD-10-CM | POA: Insufficient documentation

## 2014-04-29 DIAGNOSIS — Z79899 Other long term (current) drug therapy: Secondary | ICD-10-CM | POA: Insufficient documentation

## 2014-04-29 DIAGNOSIS — Z8719 Personal history of other diseases of the digestive system: Secondary | ICD-10-CM | POA: Insufficient documentation

## 2014-04-29 DIAGNOSIS — Z880 Allergy status to penicillin: Secondary | ICD-10-CM | POA: Diagnosis not present

## 2014-04-29 DIAGNOSIS — I10 Essential (primary) hypertension: Secondary | ICD-10-CM | POA: Diagnosis not present

## 2014-04-29 DIAGNOSIS — Z720 Tobacco use: Secondary | ICD-10-CM | POA: Diagnosis not present

## 2014-04-29 DIAGNOSIS — G309 Alzheimer's disease, unspecified: Secondary | ICD-10-CM | POA: Diagnosis not present

## 2014-04-29 DIAGNOSIS — R05 Cough: Secondary | ICD-10-CM | POA: Diagnosis present

## 2014-04-29 DIAGNOSIS — J029 Acute pharyngitis, unspecified: Secondary | ICD-10-CM | POA: Diagnosis not present

## 2014-04-29 HISTORY — DX: Unspecified abdominal hernia without obstruction or gangrene: K46.9

## 2014-04-29 MED ORDER — ALBUTEROL SULFATE HFA 108 (90 BASE) MCG/ACT IN AERS
2.0000 | INHALATION_SPRAY | Freq: Once | RESPIRATORY_TRACT | Status: AC
  Administered 2014-04-29: 2 via RESPIRATORY_TRACT
  Filled 2014-04-29: qty 6.7

## 2014-04-29 MED ORDER — HYDROCOD POLST-CHLORPHEN POLST 10-8 MG/5ML PO LQCR: 5.0000 mL | Freq: Two times a day (BID) | ORAL | Status: DC | PRN

## 2014-04-29 MED ORDER — ALBUTEROL SULFATE HFA 108 (90 BASE) MCG/ACT IN AERS: 2.0000 | INHALATION_SPRAY | Freq: Four times a day (QID) | RESPIRATORY_TRACT | Status: DC | PRN

## 2014-04-29 MED ORDER — AEROCHAMBER PLUS FLO-VU MEDIUM MISC
1.0000 | Freq: Once | Status: AC
  Administered 2014-04-29: 1
  Filled 2014-04-29: qty 1

## 2014-04-29 NOTE — ED Provider Notes (Signed)
CSN: [id number]     Arrival date & time 04/29/14  0927 History   First MD Initiated Contact with Patient 04/29/14 [phone]     Chief Complaint  Patient presents with  . Cough     (Consider location/radiation/quality/duration/timing/severity/associated sxs/prior Treatment) HPI Comments: The patient is a 59 year old female with a past medical history of tobacco abuse presenting to the emergency room with a chief complaint of cough for several months, worsening over the last 3 nights. She reports associated posttussis emesis. Nasal congestion, sore throat and chills. Denies fever. Pt not on an ace-inhibitor.  Patient reports wheezing at night, unrelieved with her albuterol inhaler. Short using the inhaler approximately 4 times last night, usually uses twice a year.   Patient is a 59 y.o. female presenting with cough. The history is provided by the patient. No language interpreter was used.  Cough Associated symptoms: chills, sore throat and wheezing   Associated symptoms: no fever and no shortness of breath     Past Medical History  Diagnosis Date  . Hypertension   . Normal cardiac stress test 2007  . Alzheimer disease   . Hernia, abdominal    Past Surgical History  Procedure Laterality Date  . Cesarean section    . Curretage for endometriosis  93, 94,95, 96  . Cholecystectomy    . Gyn surgery    . Abdominal hysterectomy    . Ovarian cyst removal    . Hernia repair     Family History  Problem Relation Age of Onset  . Cancer Mother   . Stroke Mother   . COPD Mother   . Dementia Mother   . Diabetes Father   . Heart attack Father    History  Substance Use Topics  . Smoking status: Current Every Day Smoker -- 0.50 packs/day for 30 years    Types: Cigarettes  . Smokeless tobacco: Former Neurosurgeon  . Alcohol Use: Yes     Comment: occasional   OB History    No data available     Review of Systems  Constitutional: Positive for chills. Negative for fever.  HENT: Positive for  congestion, nosebleeds and sore throat.   Respiratory: Positive for cough and wheezing. Negative for shortness of breath.       Allergies  Amoxicillin and Sulfonamide derivatives  Home Medications   Prior to Admission medications   Medication Sig Start Date End Date Taking? Authorizing Provider  acetaminophen (TYLENOL) 500 MG tablet Take 1 tablet (500 mg total) by mouth every 6 (six) hours as needed for moderate pain (back pain). Patient taking differently: Take 1,000 mg by mouth every 6 (six) hours as needed for mild pain, moderate pain, fever or headache (back pain).  07/12/13  Yes Ricki Rodriguez, MD  albuterol (PROVENTIL HFA;VENTOLIN HFA) 108 (90 BASE) MCG/ACT inhaler Inhale 4 puffs into the lungs every 4 (four) hours as needed for wheezing or shortness of breath.   Yes Historical Provider, MD  Chlorpheniramine-DM (COUGH & COLD PO) Take 5 mLs by mouth once.   Yes Historical Provider, MD  guaiFENesin (ROBITUSSIN) 100 MG/5ML SOLN Take 10 mLs by mouth every 4 (four) hours as needed for cough or to loosen phlegm.   Yes Historical Provider, MD  losartan (COZAAR) 50 MG tablet Take 50 mg by mouth daily.   Yes Historical Provider, MD  Misc Natural Product Nasal (AFRIN PURESEA ULTRAGENTLE MIST) SOLN Place 1 spray into the nose once.   Yes Historical Provider, MD  nitroGLYCERIN (NITROSTAT) 0.3 MG  SL tablet Place 0.3 mg under the tongue every 5 (five) minutes as needed for chest pain.    Yes Historical Provider, MD  lactose free nutrition (BOOST PLUS) LIQD Take 237 mLs by mouth 2 (two) times daily between meals. Patient not taking: Reported on 04/29/2014 07/12/13   Ricki RodriguezAjay S Kadakia, MD   BP 149/98 mmHg  Pulse 102  Temp(Src) 98.6 F (37 C) (Oral)  Resp 18  Ht 5\' 2"  (1.575 m)  Wt 105 lb (47.628 kg)  BMI 19.20 kg/m2  SpO2 97% Physical Exam  Constitutional: She appears well-developed and well-nourished. No distress.  HENT:  Head: Normocephalic and atraumatic.  Right Ear: Tympanic membrane and  external ear normal. No middle ear effusion.  Left Ear: Tympanic membrane and external ear normal.  No middle ear effusion.  Nose: Rhinorrhea present. Right sinus exhibits no maxillary sinus tenderness and no frontal sinus tenderness. Left sinus exhibits no maxillary sinus tenderness and no frontal sinus tenderness.  Mouth/Throat: Uvula is midline and mucous membranes are normal. No oral lesions. No trismus in the jaw. Posterior oropharyngeal erythema present. No oropharyngeal exudate, posterior oropharyngeal edema or tonsillar abscesses.  Eyes: EOM are normal. Pupils are equal, round, and reactive to light. Right eye exhibits no discharge. Left eye exhibits no discharge.  Neck: Normal range of motion. Neck supple.  Cardiovascular: Normal rate and regular rhythm.   Not tachycardic on exam.  Pulmonary/Chest: Effort normal and breath sounds normal. No respiratory distress. She has no decreased breath sounds. She has no wheezes. She has no rhonchi. She has no rales.  Prolonged expiratory phase without wheezing. Patient is able to speak in complete sentences.   Abdominal: Soft.  Lymphadenopathy:    She has no cervical adenopathy.  Skin: Skin is warm and dry. No rash noted. She is not diaphoretic.  Psychiatric: She has a normal mood and affect. Her behavior is normal. Thought content normal.  Nursing note and vitals reviewed.   ED Course  Procedures (including critical care time) Labs Review Labs Reviewed - No data to display  Imaging Review Dg Chest 2 View  04/29/2014   CLINICAL DATA:  Three-day history of cough and congestion  EXAM: CHEST  2 VIEW  COMPARISON:  October 26, 2013  FINDINGS: Lungs are clear. Heart size and pulmonary vascularity are normal. No adenopathy. No bone lesions.  IMPRESSION: No edema or consolidation.   Electronically Signed   By: Bretta BangWilliam  Woodruff III M.D.   On: 04/29/2014 10:04     EKG Interpretation None      MDM   Final diagnoses:  Acute bronchitis, unspecified  organism   Patient with worsening cough over the last 3 days, no wheezing on exam but complains of wheezing night. Given history of tobacco abuse and recent illness likely bronchitis. Plan to treat with albuterol inhaler, cough medication follow-up with a primary care provider. X-ray negative for acute findings, patient is afebrile. SpO2 97% RA. Meds given in ED:  Medications  albuterol (PROVENTIL HFA;VENTOLIN HFA) 108 (90 BASE) MCG/ACT inhaler 2 puff (2 puffs Inhalation Given 04/29/14 1057)  AEROCHAMBER PLUS FLO-VU MEDIUM device MISC 1 each (1 each Other Given 04/29/14 1057)    New Prescriptions   ALBUTEROL (PROVENTIL HFA;VENTOLIN HFA) 108 (90 BASE) MCG/ACT INHALER    Inhale 2 puffs into the lungs every 6 (six) hours as needed for wheezing or shortness of breath.   CHLORPHENIRAMINE-HYDROCODONE (TUSSIONEX PENNKINETIC ER) 10-8 MG/5ML LQCR    Take 5 mLs by mouth every 12 (twelve) hours  as needed for cough (Cough).       Mellody Drown, PA-C 04/29/14 1419  Arby Barrette, MD 04/30/14 [phone]

## 2014-04-29 NOTE — ED Notes (Signed)
Pt c/o cough x 1 week.  States she has been coughing so hard for last 3 days that it makes her throw up.  States she has taken lots of OTC meds that aren't working. Also c/o some diarrhea but thinks it's b/c of the coughing.

## 2014-04-29 NOTE — Discharge Instructions (Signed)
Call for a follow up appointment with a Family or Primary Care Provider.  °Return if Symptoms worsen.   °Take medication as prescribed.  ° ° °Emergency Department Resource Guide °1) Find a Doctor and Pay Out of Pocket °Although you won't have to find out who is covered by your insurance plan, it is a good idea to ask around and get recommendations. You will then need to call the office and see if the doctor you have chosen will accept you as a new patient and what types of options they offer for patients who are self-pay. Some doctors offer discounts or will set up payment plans for their patients who do not have insurance, but you will need to ask so you aren't surprised when you get to your appointment. ° °2) Contact Your Local Health Department °Not all health departments have doctors that can see patients for sick visits, but many do, so it is worth a call to see if yours does. If you don't know where your local health department is, you can check in your phone book. The CDC also has a tool to help you locate your state's health department, and many state websites also have listings of all of their local health departments. ° °3) Find a Walk-in Clinic °If your illness is not likely to be very severe or complicated, you may want to try a walk in clinic. These are popping up all over the country in pharmacies, drugstores, and shopping centers. They're usually staffed by nurse practitioners or physician assistants that have been trained to treat common illnesses and complaints. They're usually fairly quick and inexpensive. However, if you have serious medical issues or chronic medical problems, these are probably not your best option. ° °No Primary Care Doctor: °- Call [facility] at  832-8000 - they can help you locate a primary care doctor that  accepts your insurance, provides certain services, etc. °- Physician Referral Service- 1-800-533-3463 ° °Chronic Pain Problems: °Organization         Address  Phone    Notes  °[facility]  (336) 297-2271 Patients need to be referred by their primary care doctor.  ° °Medication Assistance: °Organization         Address  Phone   Notes  °[place] [facility] [address] °[place], [place] [address] (336) 641-8030 --Must be a resident of [place] °-- Must have NO insurance coverage whatsoever (no Medicaid/ Medicare, etc.) °-- The pt. MUST have a primary care doctor that directs their care regularly and follows them in the community °  °[facility]  (866) 331-1348   °[employer]  (888) 892-1162   ° °Agencies that provide inexpensive medical care: °Organization         Address  Phone   Notes  °[facility]  (336) 832-8035   °[facility]    (336) 832-7272   °[facility] [address] °[place],  [address] (336) 832-4777   °[facility] [address], °[place] (336) 271-4999   °[facility]    (336) 373-0678   °[facility]    (336) 272-1050   °[facility] ° [address], [place] Phone:  (336) 832-4444, Fax:  (336) 832-4440 Hours of Operation:  9 am - 6 pm, M-F.  Also accepts Medicaid/Medicare and self-pay.  °[facility] ° [address], [place]   Phone: (336) 832-3150, Fax: (336) 832-3151. Hours of Operation:  8:30 am - 5:30 pm, M-F.  Also accepts Medicaid and self-pay.  °[facility] [place] [address], [place] Phone: (336) 878-6027   °[facility] [address], [place], [place] (336)723-1848, Ext. 123 Mondays & Thursdays: 7-9 AM.  First 15 patients are seen on a first come, first serve basis. °  ° °Medicaid-accepting [place] Providers: ° °Organization         Address  Phone   Notes  °[facility] [address], [place] (336) 641-2100 Also accepts self-pay patients.  °[facility]  [address], [place] ° (336) 856-9996   °[facility] [address], [place] (336) 288-8857   °[facility] [address] [place] [address], [place] (336) 299-7000   °Veita Bland [address], [place]  ° (336) 373-1557 Only accepts [place] Access Medicaid patients after they have their name applied to their card.  ° °Self-Pay (no insurance) in [place]: ° °Organization         Address  Phone   Notes  °Sickle Cell Patients, [facility] [address], [place] (336) 832-1970   °[facility] [address], [place] (336) 832-4400   °[facility] ° [address],  (336) 992-4800   °[facility]/Dr. Osei-Bonsu ° [address] [place] [address], [place] or [address], [place] (336) 841-8500 Phone number for both [place] and [place] locations is the same.  °[facility] [address], [place] (336) 299-0000   °[facility] [address] [place] [address], [place] or [address] (336) 852-7530 °(336) 878-2260   °[facility] [address], [place] (336) 350-1642, phone; (336) 294-5005, fax Sees patients 1st and 3rd Saturday of every month.  Must not qualify for public or private insurance (i.e. Medicaid, Medicare, [place] Health Choice, Veterans' Benefits) • Household income should be no more than 200% of the poverty level •The clinic cannot treat you if you are pregnant or think you are pregnant • Sexually transmitted diseases are not treated at the clinic.  ° ° °Dental Care: °Organization         Address  Phone  Notes  °[facility] [facility] [address], [place] (336) 641-6152 Accepts children up to age 21 who are enrolled in Medicaid or [place] Health Choice; pregnant women with a Medicaid card; and children who have  applied for Medicaid or [place] Health Choice, but were declined, whose parents can pay a reduced fee at time of service.  °[facility] [place]  [address], [place] (336) 641-7733 Accepts children up to age 21 who are enrolled in Medicaid or [place] Health Choice; pregnant women with a Medicaid card; and children who have applied for Medicaid or [place] Health Choice, but were declined, whose parents can pay a reduced fee at time of service.  °[facility] ° [address], [place] (336) 641-4533 Patients are seen by appointment only. Walk-ins are not accepted. [facility] will see patients 18 years of age and older. °Monday - Tuesday (8am-5pm) °Most Wednesdays (8:30-5pm) °$30 per visit, cash only  °[facility] ° [address], [place] (336) 641-4533 Patients are seen by appointment only. Walk-ins are not accepted. [facility] will see patients 18 years of age and older. °One Wednesday Evening (Monthly: Volunteer Based).  $30 per visit, cash only  °[facility]  (919) 537-3737 for adults; Children under age 4, call Graduate Pediatric Dentistry at (919) [phone]. Children aged 4-14, please call (919) [phone] to request a pediatric application. ° Dental services are provided in all areas of dental care including fillings, crowns and bridges, complete and partial dentures, implants, gum treatment, root canals, and extractions. Preventive care is also provided. Treatment is provided to both adults and children. °Patients are selected via a lottery and there is often a waiting list. °  °[facility] [address], °[place] ° (336) 763-8833 [email or web address] °  °[facility] [address], [place], [place] (336)723-1848, Ext. 123 Second and Fourth Thursday of each month, opens at 6:30 AM; Clinic ends at 9 AM.  Patients are seen on a first-come first-served basis, and a  limited number are seen during each clinic.  ° °[facility] ° [address], [place], [place] (336) 723-7904   Eligibility Requirements °You must have lived in [place], [place], or [place] counties for at least the last three months. °  You cannot be eligible for state or federal sponsored healthcare insurance, including Veterans Administration, Medicaid, or Medicare. °  You generally cannot be eligible for healthcare insurance through your employer.  °  How to apply: °Eligibility screenings are held every Tuesday and Wednesday afternoon from 1:00 pm until 4:00 pm. You do not need an appointment for the interview!  °[facility] [address], [place], [place] 336-631-2330   °[facility]  336-342-8273   °[facility]  336-703-3100   °[facility]  336-570-6415   ° °Behavioral Health Resources in the Community: °Intensive Outpatient Programs °Organization         Address  Phone  Notes  °[facility] [address], [place], [place] 336-878-6098   °[facility] [address], [place], [place] 336-832-9800   °[facility] [address], [place], [place] ° 336-882-2125   °[facility] [address],  °[place], [place] 1-800-853-5163 or 336-641-4981   °Substance Abuse Resources °Organization         Address  Phone  Notes  °[facility]  336-882-2125   °[facility]  336-784-9470   °[facility]  336-285-9073   °[facility]  336-845-3988   °Residential & Outpatient Substance Abuse Program  1-800-659-3381   °Psychological Services °Organization         Address  Phone  Notes  °[facility]  336- 832-9600   °[facility]  336- 378-7881   °[facility] [address], [place] 1-800-853-5163 or 336-641-4981   ° °Mobile Crisis Teams °Organization          Address  Phone  Notes  °[facility], Mobile Crisis Care Unit  1-877-626-1772   °Assertive °[facility] ° [address] [place],  336-834-9664   °Sharon DeEsch [address] °[place]  336-554-5454   ° °Self-Help/Support Groups °Organization         Address  Phone             Notes  °[facility] - variety of   support groups  336- 373-1402 Call for more information  °Narcotics Anonymous (NA), [facility] [address], °[place]  2 meetings at this location  ° °Residential Treatment Programs °Organization         Address  Phone  Notes  °[facility] [address],    °[place]  1-866-801-8205   °[facility] ° [address], [place], [place] 704-293-8524   °[facility] [address], [place] 336-845-3988 Admissions: 8am-3pm M-F  °[facility] [address]    °[place], [place] 336-841-1104   °[facility] [address], [place], [place] 336-379-7146   °[facility] [address]  °[place], [place] 336-285-9073   °[facility] - Intensive Outpatient [address], [place], [place] 336-852-3033   °[facility] [address]  °[place], [place] 1-877-615-2722 or 336-784-9470   °[facility] [address] [place], [place] 336-227-7417 Accepts Medicaid  °[facility] [address]  °[place] 1-800-659-3381 Substance Abuse/Addiction Treatment  ° °[place] Behavioral Health Resources °Organization         Address  Phone  Notes  °[facility]  (888) 581-9988   °Julie Brannon, PhD [address] [place], [place]   (336) 349-5553 or (336) 951-0000   °[facility]   [address] °[place], [place] (336) 349-4454   °[facility] [address], [place], [place] (336) 342-8316 Insurance/Medicaid/sponsorship  through [facility]  °[facility] [address]                                    [place], [place] (336) 342-8316 Therapy/tele-psych/case  °[facility] [address]  ° [place], [place] (336) 349-2233    °Dr. Arfeen  (336) 349-4544   °[facility] [place]  [employer] [place] [facility] 1) [address], [place] °2) [address], [place] °3)  [address] [place] [address], [place] (336) 349-3220 °(336) 342-7768 ° °(336) 342-8140   °[place] [facility] (336) 342-1394 or (336) 342-3537 (After Hours)    ° °

## 2014-08-12 ENCOUNTER — Encounter (HOSPITAL_COMMUNITY): Payer: Self-pay

## 2014-08-12 ENCOUNTER — Emergency Department (HOSPITAL_COMMUNITY)
Admission: EM | Admit: 2014-08-12 | Discharge: 2014-08-12 | Disposition: A | Discharge status: Home / Self Care | Payer: Medicare Other | Attending: Emergency Medicine | Admitting: Emergency Medicine

## 2014-08-12 DIAGNOSIS — Z880 Allergy status to penicillin: Secondary | ICD-10-CM | POA: Insufficient documentation

## 2014-08-12 DIAGNOSIS — Z8719 Personal history of other diseases of the digestive system: Secondary | ICD-10-CM | POA: Diagnosis not present

## 2014-08-12 DIAGNOSIS — G309 Alzheimer's disease, unspecified: Secondary | ICD-10-CM | POA: Diagnosis not present

## 2014-08-12 DIAGNOSIS — I10 Essential (primary) hypertension: Secondary | ICD-10-CM | POA: Insufficient documentation

## 2014-08-12 DIAGNOSIS — R21 Rash and other nonspecific skin eruption: Secondary | ICD-10-CM | POA: Diagnosis not present

## 2014-08-12 DIAGNOSIS — Z79899 Other long term (current) drug therapy: Secondary | ICD-10-CM | POA: Insufficient documentation

## 2014-08-12 DIAGNOSIS — Z720 Tobacco use: Secondary | ICD-10-CM | POA: Diagnosis not present

## 2014-08-12 MED ORDER — PREDNISONE 10 MG (21) PO TBPK: 10.0000 mg | ORAL_TABLET | Freq: Every day | ORAL | Status: DC

## 2014-08-12 MED ORDER — HYDROXYZINE HCL 25 MG PO TABS: 25.0000 mg | ORAL_TABLET | Freq: Four times a day (QID) | ORAL | Status: DC | PRN

## 2014-08-12 MED ORDER — DEXAMETHASONE SODIUM PHOSPHATE 10 MG/ML IJ SOLN
10.0000 mg | Freq: Once | INTRAMUSCULAR | Status: AC
  Administered 2014-08-12: 10 mg via INTRAMUSCULAR
  Filled 2014-08-12: qty 1

## 2014-08-12 MED ORDER — PERMETHRIN 5 % EX CREA: TOPICAL_CREAM | CUTANEOUS | Status: DC

## 2014-08-12 NOTE — ED Notes (Signed)
Patient has a generalized rash on legs, back and feet. Patient states she has been using Benadryl and cortisone cream with no relief. Patient also states that her daughter has the same type of rash.

## 2014-08-12 NOTE — ED Provider Notes (Signed)
CSN: [id number]     Arrival date & time 08/12/14  1426 History  This chart was scribed for a non-physician practitioner, Trixie DredgeEmily Dima Mini, PA-C working with Lorre NickAnthony Allen, MD by [place] Peace, ED Scribe. The patient was seen in [facility]. The patient's care was started at [facility]3:24 PM.    Chief Complaint  Patient presents with  . Rash      Patient is a 59 y.o. female presenting with rash. The history is provided by the patient. No language interpreter was used.  Rash Location:  Full body Quality: itchiness and redness   Severity:  Severe Onset quality:  Gradual Timing:  Constant Progression:  Spreading Chronicity:  New Context: not animal contact, not food, not insect bite/sting, not medications, not plant contact and not sick contacts   Ineffective treatments:  Antibiotic cream and topical steroids Associated symptoms: no fever, no myalgias, no shortness of breath, no throat swelling, no tongue swelling and not vomiting   HPI Comments: Brittany Pepperserry Kelleher is a 59 y.o. female who presents to the Emergency Department complaining of progressively worsening generalized body rash onset a few days ago with pain, redness, and itching to affected areas. Rash has spread to pt's back, torso, arms, and legs. No complaints of SOB, fever, or trouble swallowing. Pt denies any recent exposure to insects or poison ivy. She further denies any usage of new shampoos, lotions, perfumes, creams, or laundry detergent. Pt adds she has tried washing all of her sheets with hot water before putting them back on her bed but still reports no improvement in symptoms. Pt notes she has tried taking Benadryl along with applying Calamine Lotion, and Hydrocortisone cream to affected areas multiple times a day without any relief. She reports her daughter has now started suffering from same symptoms as well.   Past Medical History  Diagnosis Date  . Hypertension   . Normal cardiac stress test 2007  . Alzheimer disease   . Hernia,  abdominal    Past Surgical History  Procedure Laterality Date  . Cesarean section    . Curretage for endometriosis  93, 94,95, 96  . Cholecystectomy    . Gyn surgery    . Abdominal hysterectomy    . Ovarian cyst removal    . Hernia repair     Family History  Problem Relation Age of Onset  . Cancer Mother   . Stroke Mother   . COPD Mother   . Dementia Mother   . Diabetes Father   . Heart attack Father    History  Substance Use Topics  . Smoking status: Current Every Day Smoker -- 0.50 packs/day for 30 years    Types: Cigarettes  . Smokeless tobacco: Former NeurosurgeonUser  . Alcohol Use: Yes     Comment: occasional   OB History    No data available     Review of Systems  Constitutional: Negative for fever and chills.  HENT: Negative for trouble swallowing.   Respiratory: Negative for shortness of breath.   Gastrointestinal: Negative for vomiting.  Musculoskeletal: Negative for myalgias.  Skin: Positive for color change and rash.  Allergic/Immunologic: Negative for immunocompromised state.  Neurological: Negative for weakness.  Hematological: Does not bruise/bleed easily.  Psychiatric/Behavioral: Negative for self-injury.      Allergies  Amoxicillin and Sulfonamide derivatives  Home Medications   Prior to Admission medications   Medication Sig Start Date End Date Taking? Authorizing Provider  acetaminophen (TYLENOL) 500 MG tablet Take 1 tablet (500 mg total) by mouth every  6 (six) hours as needed for moderate pain (back pain). Patient taking differently: Take 1,000 mg by mouth every 6 (six) hours as needed for mild pain, moderate pain, fever or headache (back pain).  07/12/13   Orpah Cobb, MD  albuterol (PROVENTIL HFA;VENTOLIN HFA) 108 (90 BASE) MCG/ACT inhaler Inhale 2 puffs into the lungs every 6 (six) hours as needed for wheezing or shortness of breath. 04/29/14   Mellody Drown, PA-C  Chlorpheniramine-DM (COUGH & COLD PO) Take 5 mLs by mouth once.    Historical  Provider, MD  chlorpheniramine-HYDROcodone (TUSSIONEX PENNKINETIC ER) 10-8 MG/5ML LQCR Take 5 mLs by mouth every 12 (twelve) hours as needed for cough (Cough). 04/29/14   Mellody Drown, PA-C  guaiFENesin (ROBITUSSIN) 100 MG/5ML SOLN Take 10 mLs by mouth every 4 (four) hours as needed for cough or to loosen phlegm.    Historical Provider, MD  losartan (COZAAR) 50 MG tablet Take 50 mg by mouth daily.    Historical Provider, MD  Misc Natural Product Nasal (AFRIN PURESEA ULTRAGENTLE MIST) SOLN Place 1 spray into the nose once.    Historical Provider, MD  nitroGLYCERIN (NITROSTAT) 0.3 MG SL tablet Place 0.3 mg under the tongue every 5 (five) minutes as needed for chest pain.     Historical Provider, MD   BP 167/96 mmHg  Pulse 98  Temp(Src) 98.2 F (36.8 C) (Oral)  Resp 16  SpO2 100% Physical Exam  Constitutional: She appears well-developed and well-nourished. No distress.  HENT:  Head: Normocephalic and atraumatic.  Neck: Neck supple.  Cardiovascular: Normal rate and regular rhythm.   Pulmonary/Chest: Effort normal.  Musculoskeletal: Normal range of motion.  Neurological: She is alert.  Skin: Rash noted. She is not diaphoretic. There is erythema.  Erythematous papular rash with large lesions consistent with insect bites mostly around lower back and waist line but also throughout extremities. No areas of edema, warmth, or discharge. No burrows noted.   Psychiatric: She has a normal mood and affect. Her behavior is normal.  Nursing note and vitals reviewed.   ED Course  Procedures (including critical care time) Labs Review Labs Reviewed - No data to display  Imaging Review No results found.   EKG Interpretation None     Medications - No data to display  3:28 PM- Treatment plan was discussed with patient who verbalizes understanding and agrees.   MDM   Final diagnoses:  Rash    Afebrile, nontoxic patient with pruritic rash that appears to be clusters of insect bites.  ?bed  bugs or less likely scabies.  No burrows noted.  Pt has intense itching.   D/C home with steroids, vistaril, permethrin.  Discussed result, findings, treatment, and follow up  with patient.  Pt given return precautions.  Pt verbalizes understanding and agrees with plan.       I personally performed the services described in this documentation, which was scribed in my presence. The recorded information has been reviewed and is accurate.    Trixie Dredge, PA-C 08/12/14 1720  Lorre Nick, MD 08/16/14 [phone]

## 2014-08-12 NOTE — Discharge Instructions (Signed)
Read the information below.  Use the prescribed medication as directed.  Please discuss all new medications with your pharmacist.  You may return to the Emergency Department at any time for worsening condition or any new symptoms that concern you.   If you develop increasing redness, pain, swelling, pus draining from the wound, or fevers greater than 100.4, return to the ER immediately for a recheck.     Rash A rash is a change in the color or feel of your skin. There are many different types of rashes. You may have other problems along with your rash. HOME CARE  Avoid the thing that caused your rash.  Do not scratch your rash.  You may take cools baths to help stop itching.  Only take medicines as told by your doctor.  Keep all doctor visits as told. GET HELP RIGHT AWAY IF:   Your pain, puffiness (swelling), or redness gets worse.  You have a fever.  You have new or severe problems.  You have body aches, watery poop (diarrhea), or you throw up (vomit).  Your rash is not better after 3 days. MAKE SURE YOU:   Understand these instructions.  Will watch your condition.  Will get help right away if you are not doing well or get worse. Document Released: 08/24/2007 Document Revised: 05/30/2011 Document Reviewed: 12/20/2010 [facility] Patient Information 2015 [place], [place]. This information is not intended to replace advice given to you by your health care provider. Make sure you discuss any questions you have with your health care provider.

## 2014-09-11 ENCOUNTER — Other Ambulatory Visit: Payer: Self-pay | Admitting: Cardiovascular Disease

## 2014-09-11 DIAGNOSIS — R079 Chest pain, unspecified: Secondary | ICD-10-CM

## 2014-09-16 ENCOUNTER — Encounter (HOSPITAL_COMMUNITY): Payer: Medicare Other

## 2014-09-16 ENCOUNTER — Encounter (HOSPITAL_COMMUNITY)
Admission: RE | Admit: 2014-09-16 | Discharge: 2014-09-16 | Disposition: A | Discharge status: Home / Self Care | Payer: Medicare Other | Source: Ambulatory Visit | Attending: Cardiovascular Disease | Admitting: Cardiovascular Disease

## 2014-09-16 DIAGNOSIS — R079 Chest pain, unspecified: Secondary | ICD-10-CM | POA: Insufficient documentation

## 2014-09-16 LAB — NM MYOCAR MULTI W/SPECT W/WALL MOTION / EF
CHL CUP RESTING HR STRESS: 88 {beats}/min
CSEPPHR: 116 {beats}/min

## 2014-09-16 MED ORDER — TECHNETIUM TC 99M SESTAMIBI GENERIC - CARDIOLITE
10.0000 | Freq: Once | INTRAVENOUS | Status: AC | PRN
  Administered 2014-09-16: 10 via INTRAVENOUS

## 2014-09-16 MED ORDER — ONDANSETRON HCL 4 MG/2ML IJ SOLN
4.0000 mg | Freq: Once | INTRAMUSCULAR | Status: AC
  Administered 2014-09-16: 4 mg via INTRAVENOUS

## 2014-09-16 MED ORDER — REGADENOSON 0.4 MG/5ML IV SOLN
INTRAVENOUS | Status: AC
  Filled 2014-09-16: qty 5

## 2014-09-16 MED ORDER — REGADENOSON 0.4 MG/5ML IV SOLN
0.4000 mg | Freq: Once | INTRAVENOUS | Status: AC
  Administered 2014-09-16: 0.4 mg via INTRAVENOUS

## 2014-09-16 MED ORDER — ONDANSETRON HCL 4 MG/2ML IJ SOLN
INTRAMUSCULAR | Status: AC
  Administered 2014-09-16: 4 mg via INTRAVENOUS
  Filled 2014-09-16: qty 2

## 2014-09-16 MED ORDER — TECHNETIUM TC 99M SESTAMIBI GENERIC - CARDIOLITE
30.0000 | Freq: Once | INTRAVENOUS | Status: AC | PRN
  Administered 2014-09-16: 30 via INTRAVENOUS

## 2015-05-03 ENCOUNTER — Encounter (HOSPITAL_COMMUNITY): Payer: Self-pay

## 2015-05-03 ENCOUNTER — Emergency Department (HOSPITAL_COMMUNITY): Payer: Medicare Other

## 2015-05-03 ENCOUNTER — Emergency Department (HOSPITAL_COMMUNITY)
Admission: EM | Admit: 2015-05-03 | Discharge: 2015-05-03 | Disposition: A | Discharge status: Home / Self Care | Payer: Medicare Other | Attending: Emergency Medicine | Admitting: Emergency Medicine

## 2015-05-03 DIAGNOSIS — F17210 Nicotine dependence, cigarettes, uncomplicated: Secondary | ICD-10-CM | POA: Diagnosis not present

## 2015-05-03 DIAGNOSIS — Y998 Other external cause status: Secondary | ICD-10-CM | POA: Insufficient documentation

## 2015-05-03 DIAGNOSIS — Z8719 Personal history of other diseases of the digestive system: Secondary | ICD-10-CM | POA: Insufficient documentation

## 2015-05-03 DIAGNOSIS — S29001A Unspecified injury of muscle and tendon of front wall of thorax, initial encounter: Secondary | ICD-10-CM | POA: Diagnosis present

## 2015-05-03 DIAGNOSIS — Z880 Allergy status to penicillin: Secondary | ICD-10-CM | POA: Insufficient documentation

## 2015-05-03 DIAGNOSIS — I10 Essential (primary) hypertension: Secondary | ICD-10-CM | POA: Diagnosis not present

## 2015-05-03 DIAGNOSIS — Y9389 Activity, other specified: Secondary | ICD-10-CM | POA: Diagnosis not present

## 2015-05-03 DIAGNOSIS — G309 Alzheimer's disease, unspecified: Secondary | ICD-10-CM | POA: Insufficient documentation

## 2015-05-03 DIAGNOSIS — W1811XA Fall from or off toilet without subsequent striking against object, initial encounter: Secondary | ICD-10-CM | POA: Diagnosis not present

## 2015-05-03 DIAGNOSIS — Z79899 Other long term (current) drug therapy: Secondary | ICD-10-CM | POA: Insufficient documentation

## 2015-05-03 DIAGNOSIS — S20212A Contusion of left front wall of thorax, initial encounter: Secondary | ICD-10-CM | POA: Insufficient documentation

## 2015-05-03 DIAGNOSIS — Y9289 Other specified places as the place of occurrence of the external cause: Secondary | ICD-10-CM | POA: Insufficient documentation

## 2015-05-03 MED ORDER — HYDROCODONE-ACETAMINOPHEN 5-325 MG PO TABS: 2.0000 | ORAL_TABLET | ORAL | Status: DC | PRN

## 2015-05-03 NOTE — ED Notes (Signed)
Pt states fell against bathtub yesterday from toilet.  Rib pain on left side

## 2015-05-03 NOTE — ED Provider Notes (Signed)
CSN: [id number]     Arrival date & time 05/03/15  0751 History   First MD Initiated Contact with Patient 05/03/15 [phone]     Chief Complaint  Patient presents with  . Rib Injury      HPI Patient states she slipped off a toilet seat and fell against the bathtub just under the left ribs.  She has tenderness to palpation and movement to the left rib cage.  Denies any other injury denies neck pain or back pain. Past Medical History  Diagnosis Date  . Hypertension   . Normal cardiac stress test 2007  . Alzheimer disease   . Hernia, abdominal    Past Surgical History  Procedure Laterality Date  . Cesarean section    . Curretage for endometriosis  93, 94,95, 96  . Cholecystectomy    . Gyn surgery    . Abdominal hysterectomy    . Ovarian cyst removal    . Hernia repair     Family History  Problem Relation Age of Onset  . Cancer Mother   . Stroke Mother   . COPD Mother   . Dementia Mother   . Diabetes Father   . Heart attack Father    Social History  Substance Use Topics  . Smoking status: Current Every Day Smoker -- 0.50 packs/day for 30 years    Types: Cigarettes  . Smokeless tobacco: Former Neurosurgeon  . Alcohol Use: Yes     Comment: occasional   OB History    No data available     Review of Systems  All other systems reviewed and are negative  Allergies  Amoxicillin and Sulfonamide derivatives  Home Medications   Prior to Admission medications   Medication Sig Start Date End Date Taking? Authorizing Provider  albuterol (PROVENTIL HFA;VENTOLIN HFA) 108 (90 BASE) MCG/ACT inhaler Inhale 2 puffs into the lungs every 6 (six) hours as needed for wheezing or shortness of breath. 04/29/14  Yes Mellody Drown, PA-C  amLODipine (NORVASC) 5 MG tablet Take 5 mg by mouth daily.  04/27/15  Yes Historical Provider, MD  losartan (COZAAR) 50 MG tablet Take 50 mg by mouth daily.   Yes Historical Provider, MD  nitroGLYCERIN (NITROSTAT) 0.3 MG SL tablet Place 0.3 mg under the tongue every 5  (five) minutes as needed for chest pain (chest pain).    Yes Historical Provider, MD  acetaminophen (TYLENOL) 500 MG tablet Take 1 tablet (500 mg total) by mouth every 6 (six) hours as needed for moderate pain (back pain). Patient not taking: Reported on 05/03/2015 07/12/13   Orpah Cobb, MD  chlorpheniramine-HYDROcodone [facility] PENNKINETIC ER) 10-8 MG/5ML LQCR Take 5 mLs by mouth every 12 (twelve) hours as needed for cough (Cough). Patient not taking: Reported on 08/12/2014 04/29/14   Mellody Drown, PA-C  HYDROcodone-acetaminophen (NORCO/VICODIN) 5-325 MG tablet Take 2 tablets by mouth every 4 (four) hours as needed. 05/03/15   Nelva Nay, MD  hydrOXYzine (ATARAX/VISTARIL) 25 MG tablet Take 1 tablet (25 mg total) by mouth every 6 (six) hours as needed for itching. Patient not taking: Reported on 05/03/2015 08/12/14   Trixie Dredge, PA-C  permethrin (ELIMITE) 5 % cream Apply to affected area once.  Repeat once in 7 days if needed. Patient not taking: Reported on 05/03/2015 08/12/14   Trixie Dredge, PA-C  predniSONE (STERAPRED UNI-PAK 21 TAB) 10 MG (21) TBPK tablet Take 1 tablet (10 mg total) by mouth daily. Day 1: take 6 tabs.  Day 2: 5 tabs  Day 3: 4  tabs  Day 4: 3 tabs  Day 5: 2 tabs  Day 6: 1 tab Patient not taking: Reported on 05/03/2015 08/12/14   Trixie Dredge, PA-C   BP 132/90 mmHg  Pulse 101  Temp(Src) 98 F (36.7 C) (Oral)  Resp 16  SpO2 98% Physical Exam  Constitutional: She is oriented to person, place, and time. She appears well-developed and well-nourished. No distress.  HENT:  Head: Normocephalic and atraumatic.  Eyes: Pupils are equal, round, and reactive to light.  Neck: Normal range of motion.  Cardiovascular: Normal rate and intact distal pulses.   Pulmonary/Chest: No respiratory distress.    Abdominal: Normal appearance. She exhibits no distension.  Musculoskeletal: Normal range of motion.  Neurological: She is alert and oriented to person, place, and time. No cranial nerve  deficit.  Skin: Skin is warm and dry. No rash noted.  Psychiatric: She has a normal mood and affect. Her behavior is normal.  Nursing note and vitals reviewed.   ED Course  Procedures (including critical care time) Labs Review Labs Reviewed - No data to display  Imaging Review Dg Ribs Unilateral W/chest Left  05/03/2015  CLINICAL DATA:  Left lower rib pain after fall this morning. EXAM: LEFT RIBS AND CHEST - 3+ VIEW COMPARISON:  Comparison chest x-rays dated 04/29/2014 and 10/26/2013. FINDINGS: AP view of the chest and 4 dedicated views of the left ribs are provided. Heart size is normal. Overall cardiomediastinal silhouette is stable in size and configuration. Atherosclerotic calcifications again noted at the aortic arch. A mild chronic interstitial prominence appears stable, suggesting some degree of chronic interstitial lung disease. Lungs otherwise clear. No evidence of pneumonia. No pleural effusion. No pneumothorax seen. No left-sided rib fracture or displacement identified. Mild scoliosis of the thoracolumbar spine. IMPRESSION: No acute findings.  No rib fracture seen. Electronically Signed   By: Bary Richard M.D.   On: 05/03/2015 09:57   I have personally reviewed and evaluated these images and lab results as part of my medical decision-making.    MDM   Final diagnoses:  Rib contusion, left, initial encounter        Nelva Nay, MD 05/05/15 0745

## 2015-05-03 NOTE — Discharge Instructions (Signed)
Chest Contusion °A contusion is a deep bruise. Bruises happen when an injury causes bleeding under the skin. Signs of bruising include pain, puffiness (swelling), and discolored skin. The bruise may turn blue, purple, or yellow.  °HOME CARE °· Put ice on the injured area. °¨ Put ice in a plastic bag. °¨ Place a towel between the skin and the bag. °¨ Leave the ice on for 15-20 minutes at a time, 03-04 times a day for the first 48 hours. °· Only take medicine as told by your doctor. °· Rest. °· Take deep breaths (deep-breathing exercises) as told by your doctor. °· Stop smoking if you smoke. °· Do not lift objects over 5 pounds (2.3 kilograms) for 3 days or longer if told by your doctor. °GET HELP RIGHT AWAY IF:  °· You have more bruising or puffiness. °· You have pain that gets worse. °· You have trouble breathing. °· You are dizzy, weak, or pass out (faint). °· You have blood in your pee (urine) or poop (stool). °· You cough up or throw up (vomit) blood. °· Your puffiness or pain is not helped with medicines. °MAKE SURE YOU:  °· Understand these instructions. °· Will watch your condition. °· Will get help right away if you are not doing well or get worse. °  °This information is not intended to replace advice given to you by your health care provider. Make sure you discuss any questions you have with your health care provider. °  °Document Released: 08/24/2007 Document Revised: 11/30/2011 Document Reviewed: 08/29/2011 °Elsevier Interactive Patient Education ©2016 Elsevier Inc. ° °

## 2015-06-12 ENCOUNTER — Encounter (HOSPITAL_COMMUNITY): Payer: Self-pay | Admitting: Emergency Medicine

## 2015-06-12 ENCOUNTER — Emergency Department (HOSPITAL_COMMUNITY)
Admission: EM | Admit: 2015-06-12 | Discharge: 2015-06-12 | Disposition: A | Discharge status: Home / Self Care | Payer: Medicare Other | Attending: Emergency Medicine | Admitting: Emergency Medicine

## 2015-06-12 DIAGNOSIS — K029 Dental caries, unspecified: Secondary | ICD-10-CM | POA: Diagnosis not present

## 2015-06-12 DIAGNOSIS — I10 Essential (primary) hypertension: Secondary | ICD-10-CM | POA: Insufficient documentation

## 2015-06-12 DIAGNOSIS — G309 Alzheimer's disease, unspecified: Secondary | ICD-10-CM | POA: Insufficient documentation

## 2015-06-12 DIAGNOSIS — Z880 Allergy status to penicillin: Secondary | ICD-10-CM | POA: Diagnosis not present

## 2015-06-12 DIAGNOSIS — K0889 Other specified disorders of teeth and supporting structures: Secondary | ICD-10-CM

## 2015-06-12 DIAGNOSIS — K002 Abnormalities of size and form of teeth: Secondary | ICD-10-CM | POA: Insufficient documentation

## 2015-06-12 DIAGNOSIS — Z792 Long term (current) use of antibiotics: Secondary | ICD-10-CM | POA: Insufficient documentation

## 2015-06-12 DIAGNOSIS — Z79899 Other long term (current) drug therapy: Secondary | ICD-10-CM | POA: Diagnosis not present

## 2015-06-12 DIAGNOSIS — F17210 Nicotine dependence, cigarettes, uncomplicated: Secondary | ICD-10-CM | POA: Insufficient documentation

## 2015-06-12 MED ORDER — BUPIVACAINE-EPINEPHRINE (PF) 0.5% -1:200000 IJ SOLN
1.8000 mL | Freq: Once | INTRAMUSCULAR | Status: AC
  Administered 2015-06-12: 1.8 mL
  Filled 2015-06-12: qty 1.8

## 2015-06-12 MED ORDER — CLINDAMYCIN HCL 150 MG PO CAPS: 450.0000 mg | ORAL_CAPSULE | Freq: Three times a day (TID) | ORAL | Status: DC

## 2015-06-12 MED ORDER — NAPROXEN 500 MG PO TABS: 500.0000 mg | ORAL_TABLET | Freq: Two times a day (BID) | ORAL | Status: DC

## 2015-06-12 NOTE — ED Provider Notes (Signed)
History  By signing my name below, I, Karle PlumberJennifer Tensley, attest that this documentation has been prepared under the direction and in the presence of Cristyn Crossno, PA-C. Electronically Signed: Karle PlumberJennifer Tensley, ED Scribe. 06/12/2015. 6:07 PM.  Chief Complaint  Patient presents with  . Dental Pain   The history is provided by the patient. No language interpreter was used.    HPI Comments: Brittany Pepperserry Cordle is a 60 y.o. female who presents to the Emergency Department complaining of right lower frontal dental pain that began about two days ago. She states she normally has a partial denture but cannot wear it at this point due to pain stating it is too tight. She reports associated gum swelling. She rates her pain at 8.5/10. She states she has not been to her dentist in 5 years and he is now retired. She is currently taking a Z-Pak and Tylenol with Codeine for a respiratory infection and cough. Eating increases her pain. She denies alleviating factors. She denies fever, chills, difficulty swallowing or breathing, nausea/vomiting, or any other complaints.   Past Medical History  Diagnosis Date  . Hypertension   . Normal cardiac stress test 2007  . Alzheimer disease   . Hernia, abdominal    Past Surgical History  Procedure Laterality Date  . Cesarean section    . Curretage for endometriosis  93, 94,95, 96  . Cholecystectomy    . Gyn surgery    . Abdominal hysterectomy    . Ovarian cyst removal    . Hernia repair     Family History  Problem Relation Age of Onset  . Cancer Mother   . Stroke Mother   . COPD Mother   . Dementia Mother   . Diabetes Father   . Heart attack Father    Social History  Substance Use Topics  . Smoking status: Current Every Day Smoker -- 0.50 packs/day for 30 years    Types: Cigarettes  . Smokeless tobacco: Former NeurosurgeonUser  . Alcohol Use: Yes     Comment: occasional   OB History    No data available     Review of Systems  Constitutional: Negative for fever and  chills.  HENT: Positive for dental problem. Negative for facial swelling and trouble swallowing.   Gastrointestinal: Negative for nausea and vomiting.    Allergies  Amoxicillin and Sulfonamide derivatives  Home Medications   Prior to Admission medications   Medication Sig Start Date End Date Taking? Authorizing Provider  acetaminophen-codeine (TYLENOL #3) 300-30 MG tablet Take 1 tablet by mouth at bedtime as needed for moderate pain.   Yes Historical Provider, MD  albuterol (PROVENTIL HFA;VENTOLIN HFA) 108 (90 BASE) MCG/ACT inhaler Inhale 2 puffs into the lungs every 6 (six) hours as needed for wheezing or shortness of breath. 04/29/14  Yes Mellody DrownLauren Parker, PA-C  amLODipine (NORVASC) 5 MG tablet Take 5 mg by mouth daily.  04/27/15  Yes Historical Provider, MD  azithromycin (ZITHROMAX) 250 MG tablet Take by mouth daily. Dose Pack. 06/10/15  Yes Historical Provider, MD  losartan (COZAAR) 50 MG tablet Take 50 mg by mouth daily.   Yes Historical Provider, MD  nitroGLYCERIN (NITROSTAT) 0.3 MG SL tablet Place 0.3 mg under the tongue every 5 (five) minutes as needed for chest pain (chest pain).    Yes Historical Provider, MD  clindamycin (CLEOCIN) 150 MG capsule Take 3 capsules (450 mg total) by mouth 3 (three) times daily. 06/12/15   Richard Ritchey C Tashon Capp, PA-C  naproxen (NAPROSYN) 500 MG tablet  Take 1 tablet (500 mg total) by mouth 2 (two) times daily. 06/12/15   Taytem Ghattas C Yukio Bisping, PA-C   Triage Vitals: BP 155/103 mmHg  Pulse 110  Temp(Src) 98.4 F (36.9 C) (Oral)  Resp 18  SpO2 95%   Physical Exam  Constitutional: She appears well-developed and well-nourished. No distress.  HENT:  Head: Normocephalic and atraumatic.  Mouth/Throat: She has dentures (partial). No trismus in the jaw. Abnormal dentition. Dental caries present.  Tenderness to right lower canine with dental carries evident. No area of fluctuance that would suggest an abscess.  Eyes: Conjunctivae are normal.  Neck: Normal range of motion.   Cardiovascular: Normal rate and regular rhythm.   Pulmonary/Chest: Effort normal. No respiratory distress.  Abdominal: Soft.  Musculoskeletal: Normal range of motion.  Lymphadenopathy:    She has no cervical adenopathy.  Neurological: She is alert.  Skin: Skin is warm and dry. She is not diaphoretic.  Psychiatric: She has a normal mood and affect. Her behavior is normal.  Nursing note and vitals reviewed.   ED Course  .Nerve Block Date/Time: 06/12/2015 6:10 PM Performed by: Anselm Pancoast Authorized by: Anselm Pancoast Consent: Verbal consent obtained. Risks and benefits: risks, benefits and alternatives were discussed Consent given by: patient Patient understanding: patient states understanding of the procedure being performed Patient consent: the patient's understanding of the procedure matches consent given Procedure consent: procedure consent matches procedure scheduled Patient identity confirmed: verbally with patient and arm band Indications: pain relief Body area: face/mouth Nerve: inferior alveolar Laterality: right Patient sedated: no Patient position: sitting Needle gauge: 27 G Location technique: anatomical landmarks Local anesthetic: bupivacaine 0.5% with epinephrine Anesthetic total: 1.8 ml Outcome: pain improved Patient tolerance: Patient tolerated the procedure well with no immediate complications    DIAGNOSTIC STUDIES: Oxygen Saturation is 95% on RA, adequate by my interpretation.   COORDINATION OF CARE: 6:07 PM- Will administer dental block and prescribe Clindamycin. Pt verbalizes understanding and agrees to plan.  Medications  bupivacaine-epinephrine (MARCAINE W/ EPI) 0.5% -1:200000 injection 1.8 mL (1.8 mLs Infiltration Given by Other 06/12/15 1825)     MDM   Final diagnoses:  Tooth pain      Brittany [name] presents with lower right dental pain the last 2 days.  Patient's presentation of dental pain was relieved with that alveolar block. Patient  has no signs of sepsis and has no red flag symptoms. No evidence of Ludwig's angioedema. Patient was not tachycardic upon my exam. Patient was advised that she will need to follow up with a dentist as soon as possible, but definitely within the timeframe in which she is taking the antibiotics. Home care and return precautions discussed. Patient voiced understanding of these instructions and is comfortable with discharge.  I personally performed the services described in this documentation, which was scribed in my presence. The recorded information has been reviewed and is accurate.       Anselm Pancoast, PA-C 06/13/15 1610  Lorre Nick, MD 06/13/15 [phone]

## 2015-06-12 NOTE — Discharge Instructions (Signed)
You have been seen today for tooth pain. You must follow up with a dentist as soon as possible, but certainly within the timeframe you're taking the antibiotics. Please take all of your antibiotics until finished!   You may develop abdominal discomfort or diarrhea from the antibiotic.  You may help offset this with probiotics which you can buy or get in yogurt. Do not eat or take the probiotics until 2 hours after your antibiotic. Follow up with PCP as needed. Return to ED should symptoms worsen.

## 2015-06-12 NOTE — ED Notes (Signed)
Pt reports R lower front dental pain for the past 2 days associated with gum swelling. Pt tried taking leftover azithromycin, which did not work. Dentist not available today.

## 2016-05-25 ENCOUNTER — Ambulatory Visit
Admission: RE | Admit: 2016-05-25 | Discharge: 2016-05-25 | Disposition: A | Discharge status: Home / Self Care | Payer: Medicare HMO | Source: Ambulatory Visit | Attending: Cardiovascular Disease | Admitting: Cardiovascular Disease

## 2016-05-25 ENCOUNTER — Other Ambulatory Visit: Payer: Self-pay | Admitting: Cardiovascular Disease

## 2016-05-25 DIAGNOSIS — R059 Cough, unspecified: Secondary | ICD-10-CM

## 2016-05-25 DIAGNOSIS — R05 Cough: Secondary | ICD-10-CM

## 2016-06-08 ENCOUNTER — Emergency Department (HOSPITAL_COMMUNITY)
Admission: EM | Admit: 2016-06-08 | Discharge: 2016-06-08 | Disposition: A | Discharge status: Home / Self Care | Payer: Medicare HMO | Attending: Emergency Medicine | Admitting: Emergency Medicine

## 2016-06-08 ENCOUNTER — Emergency Department (HOSPITAL_COMMUNITY): Payer: Medicare HMO

## 2016-06-08 ENCOUNTER — Encounter (HOSPITAL_COMMUNITY): Payer: Self-pay | Admitting: Emergency Medicine

## 2016-06-08 DIAGNOSIS — Y939 Activity, unspecified: Secondary | ICD-10-CM | POA: Diagnosis not present

## 2016-06-08 DIAGNOSIS — S8992XA Unspecified injury of left lower leg, initial encounter: Secondary | ICD-10-CM | POA: Diagnosis present

## 2016-06-08 DIAGNOSIS — W002XXA Other fall from one level to another due to ice and snow, initial encounter: Secondary | ICD-10-CM | POA: Diagnosis not present

## 2016-06-08 DIAGNOSIS — F17210 Nicotine dependence, cigarettes, uncomplicated: Secondary | ICD-10-CM | POA: Diagnosis not present

## 2016-06-08 DIAGNOSIS — S82092A Other fracture of left patella, initial encounter for closed fracture: Secondary | ICD-10-CM | POA: Insufficient documentation

## 2016-06-08 DIAGNOSIS — S82002A Unspecified fracture of left patella, initial encounter for closed fracture: Secondary | ICD-10-CM

## 2016-06-08 DIAGNOSIS — I10 Essential (primary) hypertension: Secondary | ICD-10-CM | POA: Insufficient documentation

## 2016-06-08 DIAGNOSIS — Y999 Unspecified external cause status: Secondary | ICD-10-CM | POA: Diagnosis not present

## 2016-06-08 DIAGNOSIS — Y92194 Driveway of other specified residential institution as the place of occurrence of the external cause: Secondary | ICD-10-CM | POA: Insufficient documentation

## 2016-06-08 MED ORDER — HYDROCODONE-ACETAMINOPHEN 5-325 MG PO TABS: 1.0000 | ORAL_TABLET | ORAL | 0 refills | Status: DC | PRN

## 2016-06-08 MED ORDER — OXYCODONE-ACETAMINOPHEN 5-325 MG PO TABS
2.0000 | ORAL_TABLET | Freq: Once | ORAL | Status: AC
  Administered 2016-06-08: 2 via ORAL
  Filled 2016-06-08: qty 2

## 2016-06-08 MED ORDER — IBUPROFEN 600 MG PO TABS
600.0000 mg | ORAL_TABLET | Freq: Four times a day (QID) | ORAL | 0 refills | Status: DC | PRN
Start: 2016-06-08 — End: 2017-02-02

## 2016-06-08 MED ORDER — OXYCODONE-ACETAMINOPHEN 5-325 MG PO TABS: 1.0000 | ORAL_TABLET | ORAL | 0 refills | Status: DC | PRN

## 2016-06-08 NOTE — ED Notes (Signed)
Pt is in stable condition upon d/c and ambulates from ED. 

## 2016-06-08 NOTE — Progress Notes (Signed)
Orthopedic Tech Progress Note Patient Details:  Brittany [name] 04/15/1955 [id number]  Ortho Devices Type of Ortho Device: Knee Immobilizer Ortho Device/Splint Location: lle Ortho Device/Splint Interventions: Application   Brittany [name] 06/08/2016, 2:47 PM

## 2016-06-08 NOTE — ED Triage Notes (Addendum)
Pt reports last night she had a mechanical fall on her driveway and landed face first. Denies LOC. Pt Small area of bruising about quarter sized to right upper forehead. Pt states she also landed on her knees and has left knee pain and is unable to bare weight on her left leg. Pt arrives to ED with crutches. Pt is not on blood thinners. Pt also reports pain to nose and hands.

## 2016-06-08 NOTE — Discharge Instructions (Signed)
Please call the orthopedist listed today or first thing in the morning to schedule a follow-up appointment. Ice affected area as needed for pain. You should remain nonweightbearing (do not put any pressure on your left foot) until you are seen by the orthopedist. Ibuprofen as needed for mild to moderate pain. Please take your pain medication only as needed for severe pain- This can make you very drowsy - please do not drink alcohol, operate heavy machinery or drive on this medication.

## 2016-06-08 NOTE — ED Notes (Signed)
Pt located in X-ray. Called X-ray stated they will bring pt back to room.

## 2016-06-08 NOTE — ED Notes (Signed)
Placed pt on bedpan, tolerated well. 

## 2016-06-08 NOTE — ED Provider Notes (Signed)
[facility]-EMERGENCY DEPT Provider Note   CSN: [id number] Arrival date & time: 06/08/16  1211  By signing my name below, I, Cynda Acres, attest that this documentation has been prepared under the direction and in the presence of [facility] PA-C. Electronically Signed: Cynda Acres, Scribe. 06/08/16. 1:09 PM.  History   Chief Complaint Chief Complaint  Patient presents with  . Fall  . Head Injury  . Knee Injury    HPI Comments: Brittany [name] is a 61 y.o. female with a history of hypertension and alzheimer's disease who presents to the Emergency Department complaining of sudden-onset, constant left knee pain s/p mechanical fall that happened yesterday. Patient states she tripped and fell on snow in her driveway last night, landing on her face and left knee. Patient reports associated pain with range of movement. Associated symptoms include knee swelling. Pain worse with movement and weight-bearing. Patient states she is unable to bear weight on her right knee. Patient arrived to the emergency department on crutches. Patient denies any loss of consciousness, neck pain, back pain, weakness, numbness, headache, or any other symptoms. Tetanus up-to-date.  The history is provided by the patient. No language interpreter was used.    Past Medical History:  Diagnosis Date  . Alzheimer disease   . Hernia, abdominal   . Hypertension   . Normal cardiac stress test 2007    Patient Active Problem List   Diagnosis Date Noted  . Malnutrition of moderate degree (HCC) 07/12/2013  . Syncope 07/11/2013  . GASTROENTERITIS 05/07/2010  . TOBACCO ABUSE 07/01/2009    Past Surgical History:  Procedure Laterality Date  . ABDOMINAL HYSTERECTOMY    . CESAREAN SECTION    . CHOLECYSTECTOMY    . curretage for endometriosis  93, 94,95, 96  . GYN surgery    . HERNIA REPAIR    . OVARIAN CYST REMOVAL      OB History    No data available       Home Medications    Prior to Admission medications     Medication Sig Start Date End Date Taking? Authorizing Provider  acetaminophen-codeine (TYLENOL #3) 300-30 MG tablet Take 1 tablet by mouth at bedtime as needed for moderate pain.    Historical Provider, MD  albuterol (PROVENTIL HFA;VENTOLIN HFA) 108 (90 BASE) MCG/ACT inhaler Inhale 2 puffs into the lungs every 6 (six) hours as needed for wheezing or shortness of breath. 04/29/14   Mellody Drown, PA-C  amLODipine (NORVASC) 5 MG tablet Take 5 mg by mouth daily.  04/27/15   Historical Provider, MD  azithromycin (ZITHROMAX) 250 MG tablet Take by mouth daily. Dose Pack. 06/10/15   Historical Provider, MD  clindamycin (CLEOCIN) 150 MG capsule Take 3 capsules (450 mg total) by mouth 3 (three) times daily. 06/12/15   Shawn C Joy, PA-C  HYDROcodone-acetaminophen (NORCO/VICODIN) 5-325 MG tablet Take 1 tablet by mouth every 4 (four) hours as needed. 06/08/16   Rolland Porter, MD  ibuprofen (ADVIL,MOTRIN) 600 MG tablet Take 1 tablet (600 mg total) by mouth every 6 (six) hours as needed. 06/08/16   Chase Picket Ward, PA-C  losartan (COZAAR) 50 MG tablet Take 50 mg by mouth daily.    Historical Provider, MD  naproxen (NAPROSYN) 500 MG tablet Take 1 tablet (500 mg total) by mouth 2 (two) times daily. 06/12/15   Shawn C Joy, PA-C  nitroGLYCERIN (NITROSTAT) 0.3 MG SL tablet Place 0.3 mg under the tongue every 5 (five) minutes as needed for chest pain (chest pain).  Historical Provider, MD  oxyCODONE-acetaminophen (PERCOCET/ROXICET) 5-325 MG tablet Take 1 tablet by mouth every 4 (four) hours as needed for severe pain. 06/08/16   Chase Picket Ward, PA-C    Family History Family History  Problem Relation Age of Onset  . Cancer Mother   . Stroke Mother   . COPD Mother   . Dementia Mother   . Diabetes Father   . Heart attack Father     Social History Social History  Substance Use Topics  . Smoking status: Current Every Day Smoker    Packs/day: 0.50    Years: 30.00    Types: Cigarettes  . Smokeless tobacco:  Former Neurosurgeon  . Alcohol use Yes     Comment: occasional     Allergies   Amoxicillin and Sulfonamide derivatives   Review of Systems Review of Systems  Musculoskeletal: Positive for arthralgias (left knee) and joint swelling (left knee). Negative for back pain, neck pain and neck stiffness.  Neurological: Negative for syncope, weakness, numbness and headaches.  All other systems reviewed and are negative.    Physical Exam Updated Vital Signs BP (!) 147/83 (BP Location: Right Arm)   Pulse (!) 107   Temp 98.9 F (37.2 C) (Oral)   Resp 17   SpO2 99%   Physical Exam  Constitutional: She appears well-developed and well-nourished. No distress.  HENT:  Head: Normocephalic. Head is without raccoon's eyes and without Battle's sign.    Right Ear: No hemotympanum.  Left Ear: No hemotympanum.  Neck: Neck supple.  Cardiovascular: Normal rate, regular rhythm and normal heart sounds.   No murmur heard. Pulmonary/Chest: Effort normal and breath sounds normal. No respiratory distress. She has no wheezes. She has no rales.  Musculoskeletal: Normal range of motion.  Left knee: + swelling. Knee with decreased range of motion. Patient is unable to completely flex or completely straighten the knee, most comfortable position around 45 of flexion. + TTP of patella. 2+ DP pulses bilaterally. All compartments are soft. Sensation intact distal to injury. Superficial abrasions to bilateral knees.  Neurological: She is alert.  Speech clear and goal oriented. CN 2-12 grossly intact. Normal finger-to-nose and rapid alternating movements. No drift. Strength and sensation intact. Unable to assess ambulation due to knee pain.  Skin: Skin is warm and dry.  Nursing note and vitals reviewed.    ED Treatments / Results  DIAGNOSTIC STUDIES: Oxygen Saturation is 99% on RA, normal by my interpretation.    COORDINATION OF CARE: 1:07 PM Discussed treatment plan with pt at bedside and pt agreed to plan,  which includes pain medication and an orthopedic follow-up.   Labs (all labs ordered are listed, but only abnormal results are displayed) Labs Reviewed - No data to display  EKG  EKG Interpretation None       Radiology Dg Knee Complete 4 Views Left  Result Date: 06/08/2016 CLINICAL DATA:  Fall today.  Left knee pain. EXAM: LEFT KNEE - COMPLETE 4+ VIEW COMPARISON:  None. FINDINGS: There is a nondisplaced fracture through the patella. Overlying soft tissue swelling. Small joint effusion. No additional acute bony abnormality. IMPRESSION: Nondisplaced midpole patellar fracture.  Small joint effusion. Electronically Signed   By: Charlett Nose M.D.   On: 06/08/2016 12:54    Procedures Procedures (including critical care time)  Medications Ordered in ED Medications  oxyCODONE-acetaminophen (PERCOCET/ROXICET) 5-325 MG per tablet 2 tablet (2 tablets Oral Given 06/08/16 1333)     Initial Impression / Assessment and Plan / ED Course  I have reviewed the triage vital signs and the nursing notes.  Pertinent labs & imaging results that were available during my care of the patient were reviewed by me and considered in my medical decision making (see chart for details).    Brittany [name] is a 61 y.o. female who presents to ED for left knee pain after a fall sustained last night. She did hit her head. Not on any anticoagulants and no focal neuro deficits. On exam, left lower extremity is neurovascularly intact, however she does have joint swelling and appears to be in a great deal of pain. X-ray obtained which shows a nondisplaced midpole patellar fracture with small joint effusion. Patient placed in knee immobilizer and given orthopedic follow-up. Patient understands to call orthopedist today to arrange follow up. Patient also understands to be non-weight bearing until seen by ortho. She has her own crutches with her today. Reasons to return to ER and all questions answered.    Final Clinical  Impressions(s) / ED Diagnoses   Final diagnoses:  Closed nondisplaced fracture of left patella, unspecified fracture morphology, initial encounter    New Prescriptions Discharge Medication List as of 06/08/2016  3:09 PM    START taking these medications   Details  HYDROcodone-acetaminophen (NORCO/VICODIN) 5-325 MG tablet Take 1 tablet by mouth every 4 (four) hours as needed., Starting Wed 06/08/2016, Print    ibuprofen (ADVIL,MOTRIN) 600 MG tablet Take 1 tablet (600 mg total) by mouth every 6 (six) hours as needed., Starting Wed 06/08/2016, Print    oxyCODONE-acetaminophen (PERCOCET/ROXICET) 5-325 MG tablet Take 1 tablet by mouth every 4 (four) hours as needed for severe pain., Starting Wed 06/08/2016, Print       I personally performed the services described in this documentation, which was scribed in my presence. The recorded information has been reviewed and is accurate.    [facility] Surgery CenterJaime Pilcher Ward, PA-C 06/08/16 1538    Rolland PorterMark James, MD 06/22/16 1200

## 2016-06-10 ENCOUNTER — Ambulatory Visit (INDEPENDENT_AMBULATORY_CARE_PROVIDER_SITE_OTHER): Payer: Medicare HMO | Admitting: Orthopaedic Surgery

## 2016-06-10 DIAGNOSIS — S82035A Nondisplaced transverse fracture of left patella, initial encounter for closed fracture: Secondary | ICD-10-CM

## 2016-06-10 NOTE — Progress Notes (Signed)
Office Visit Note   Patient: Brittany [name]           Date of Birth: September 30, 1955           MRN: [id number] Visit Date: 06/10/2016              Requested by: Orpah CobbAjay Kadakia, MD [address] [place] [address] PCP: Ricki RodriguezKADAKIA,AJAY S, MD   Assessment & Plan: Visit Diagnoses:  1. Nondisplaced transverse fracture of left patella, initial encounter for closed fracture     Plan: We'll plan on treating this nonoperatively in a Bledsoe brace 0-40. Follow-up in 6 weeks with lateral x-ray of the knee only.  Follow-Up Instructions: Return in about 6 weeks (around 07/22/2016).   Orders:  No orders of the defined types were placed in this encounter.  No orders of the defined types were placed in this encounter.     Procedures: No procedures performed   Clinical Data: No additional findings.   Subjective: No chief complaint on file.   Patient comes in today with nondisplaced left patella fracture status post mechanical fall a few days ago. She endorses constant pain at 7 out of 10. It does not radiate. She's been wearing a knee immobilizer.    Review of Systems  Constitutional: Negative.   HENT: Negative.   Eyes: Negative.   Respiratory: Negative.   Cardiovascular: Negative.   Endocrine: Negative.   Musculoskeletal: Negative.   Neurological: Negative.   Hematological: Negative.   Psychiatric/Behavioral: Negative.   All other systems reviewed and are negative.    Objective: Vital Signs: There were no vitals taken for this visit.  Physical Exam  Constitutional: She is oriented to person, place, and time. She appears well-developed and well-nourished.  HENT:  Head: Normocephalic and atraumatic.  Eyes: EOM are normal.  Neck: Neck supple.  Pulmonary/Chest: Effort normal.  Abdominal: Soft.  Neurological: She is alert and oriented to person, place, and time.  Skin: Skin is warm. Capillary refill takes less than 2 seconds.  Psychiatric: She has a normal mood  and affect. Her behavior is normal. Judgment and thought content normal.  Nursing note and vitals reviewed.   Ortho Exam Left knee exam shows superficial abrasions. She is able to bend her knee to 90 without pain. Good extension. Specialty Comments:  No specialty comments available.  Imaging: No results found.   PMFS History: Patient Active Problem List   Diagnosis Date Noted  . Nondisplaced transverse fracture of left patella, initial encounter for closed fracture 06/10/2016  . Malnutrition of moderate degree (HCC) 07/12/2013  . Syncope 07/11/2013  . GASTROENTERITIS 05/07/2010  . TOBACCO ABUSE 07/01/2009   Past Medical History:  Diagnosis Date  . Alzheimer disease   . Hernia, abdominal   . Hypertension   . Normal cardiac stress test 2007    Family History  Problem Relation Age of Onset  . Cancer Mother   . Stroke Mother   . COPD Mother   . Dementia Mother   . Diabetes Father   . Heart attack Father     Past Surgical History:  Procedure Laterality Date  . ABDOMINAL HYSTERECTOMY    . CESAREAN SECTION    . CHOLECYSTECTOMY    . curretage for endometriosis  93, 94,95, 96  . GYN surgery    . HERNIA REPAIR    . OVARIAN CYST REMOVAL     Social History   Occupational History  . Not on file.   Social History Main Topics  .  Smoking status: Current Every Day Smoker    Packs/day: 0.50    Years: 30.00    Types: Cigarettes  . Smokeless tobacco: Former Neurosurgeon  . Alcohol use Yes     Comment: occasional  . Drug use: Yes    Types: Marijuana     Comment: occasionally  . Sexual activity: No     Comment: disabled, `1 yr college, married.

## 2016-07-25 ENCOUNTER — Encounter (INDEPENDENT_AMBULATORY_CARE_PROVIDER_SITE_OTHER): Payer: Self-pay

## 2016-07-25 ENCOUNTER — Ambulatory Visit (INDEPENDENT_AMBULATORY_CARE_PROVIDER_SITE_OTHER): Payer: Medicare HMO | Admitting: Orthopaedic Surgery

## 2016-11-28 ENCOUNTER — Observation Stay (HOSPITAL_COMMUNITY)
Admission: AD | Admit: 2016-11-28 | Discharge: 2016-11-28 | Discharge status: Against Medical Advice | Payer: Medicare HMO | Source: Ambulatory Visit | Attending: Cardiovascular Disease | Admitting: Cardiovascular Disease

## 2016-11-28 ENCOUNTER — Observation Stay (HOSPITAL_COMMUNITY): Payer: Medicare HMO

## 2016-11-28 ENCOUNTER — Observation Stay (HOSPITAL_BASED_OUTPATIENT_CLINIC_OR_DEPARTMENT_OTHER): Payer: Medicare HMO

## 2016-11-28 DIAGNOSIS — R0602 Shortness of breath: Secondary | ICD-10-CM | POA: Diagnosis not present

## 2016-11-28 DIAGNOSIS — F17210 Nicotine dependence, cigarettes, uncomplicated: Secondary | ICD-10-CM | POA: Insufficient documentation

## 2016-11-28 DIAGNOSIS — M7989 Other specified soft tissue disorders: Secondary | ICD-10-CM | POA: Diagnosis not present

## 2016-11-28 DIAGNOSIS — R200 Anesthesia of skin: Secondary | ICD-10-CM | POA: Diagnosis present

## 2016-11-28 DIAGNOSIS — Z882 Allergy status to sulfonamides status: Secondary | ICD-10-CM | POA: Diagnosis not present

## 2016-11-28 DIAGNOSIS — I10 Essential (primary) hypertension: Secondary | ICD-10-CM | POA: Insufficient documentation

## 2016-11-28 DIAGNOSIS — Z6821 Body mass index (BMI) 21.0-21.9, adult: Secondary | ICD-10-CM | POA: Diagnosis not present

## 2016-11-28 DIAGNOSIS — J449 Chronic obstructive pulmonary disease, unspecified: Secondary | ICD-10-CM | POA: Diagnosis not present

## 2016-11-28 DIAGNOSIS — Z825 Family history of asthma and other chronic lower respiratory diseases: Secondary | ICD-10-CM | POA: Insufficient documentation

## 2016-11-28 DIAGNOSIS — Z8249 Family history of ischemic heart disease and other diseases of the circulatory system: Secondary | ICD-10-CM | POA: Insufficient documentation

## 2016-11-28 DIAGNOSIS — R079 Chest pain, unspecified: Secondary | ICD-10-CM | POA: Diagnosis not present

## 2016-11-28 DIAGNOSIS — G309 Alzheimer's disease, unspecified: Secondary | ICD-10-CM | POA: Diagnosis not present

## 2016-11-28 DIAGNOSIS — Z833 Family history of diabetes mellitus: Secondary | ICD-10-CM | POA: Insufficient documentation

## 2016-11-28 DIAGNOSIS — Z823 Family history of stroke: Secondary | ICD-10-CM | POA: Insufficient documentation

## 2016-11-28 DIAGNOSIS — Z880 Allergy status to penicillin: Secondary | ICD-10-CM | POA: Diagnosis not present

## 2016-11-28 DIAGNOSIS — I2510 Atherosclerotic heart disease of native coronary artery without angina pectoris: Secondary | ICD-10-CM | POA: Diagnosis not present

## 2016-11-28 DIAGNOSIS — Z5321 Procedure and treatment not carried out due to patient leaving prior to being seen by health care provider: Secondary | ICD-10-CM | POA: Insufficient documentation

## 2016-11-28 DIAGNOSIS — Z79899 Other long term (current) drug therapy: Secondary | ICD-10-CM | POA: Insufficient documentation

## 2016-11-28 DIAGNOSIS — E440 Moderate protein-calorie malnutrition: Secondary | ICD-10-CM | POA: Diagnosis present

## 2016-11-28 DIAGNOSIS — Z809 Family history of malignant neoplasm, unspecified: Secondary | ICD-10-CM | POA: Diagnosis not present

## 2016-11-28 DIAGNOSIS — F17200 Nicotine dependence, unspecified, uncomplicated: Secondary | ICD-10-CM | POA: Diagnosis present

## 2016-11-28 LAB — VAS US CAROTID
LCCADDIAS: -26 cm/s
LCCADSYS: -78 cm/s
LCCAPDIAS: 20 cm/s
LEFT ECA DIAS: -22 cm/s
LEFT VERTEBRAL DIAS: 14 cm/s
LICADDIAS: -31 cm/s
LICAPDIAS: -16 cm/s
LICAPSYS: -45 cm/s
Left CCA prox sys: 75 cm/s
Left ICA dist sys: -105 cm/s
RCCAPSYS: 67 cm/s
RIGHT ECA DIAS: -15 cm/s
RIGHT VERTEBRAL DIAS: 18 cm/s
Right CCA prox dias: 15 cm/s
Right cca dist sys: -91 cm/s

## 2016-11-28 LAB — COMPREHENSIVE METABOLIC PANEL
ALBUMIN: 3.5 g/dL (ref 3.5–5.0)
ALT: 73 U/L — AB (ref 14–54)
ANION GAP: 9 (ref 5–15)
AST: 61 U/L — ABNORMAL HIGH (ref 15–41)
Alkaline Phosphatase: 92 U/L (ref 38–126)
BUN: 6 mg/dL (ref 6–20)
CHLORIDE: 104 mmol/L (ref 101–111)
CO2: 28 mmol/L (ref 22–32)
Calcium: 9.2 mg/dL (ref 8.9–10.3)
Creatinine, Ser: 0.88 mg/dL (ref 0.44–1.00)
GFR calc non Af Amer: >60 mL/min (ref >60)
GLUCOSE: 96 mg/dL (ref 65–99)
Potassium: 3.7 mmol/L (ref 3.5–5.1)
SODIUM: 141 mmol/L (ref 135–145)
Total Bilirubin: 1.4 mg/dL — ABNORMAL HIGH (ref 0.3–1.2)
Total Protein: 6.1 g/dL — ABNORMAL LOW (ref 6.5–8.1)

## 2016-11-28 LAB — PROTIME-INR
INR: 1.03
Prothrombin Time: 13.4 seconds (ref 11.4–15.2)

## 2016-11-28 LAB — CBC
HCT: 39.7 % (ref 36.0–46.0)
HEMOGLOBIN: 13 g/dL (ref 12.0–15.0)
MCH: 29.6 pg (ref 26.0–34.0)
MCHC: 32.7 g/dL (ref 30.0–36.0)
MCV: 90.4 fL (ref 78.0–100.0)
PLATELETS: 222 10*3/uL (ref 150–400)
RBC: 4.39 MIL/uL (ref 3.87–5.11)
RDW: 16.2 % — ABNORMAL HIGH (ref 11.5–15.5)
WBC: 5.6 10*3/uL (ref 4.0–10.5)

## 2016-11-28 LAB — ECHOCARDIOGRAM COMPLETE
Height: 62 in
Weight: 1856 oz

## 2016-11-28 LAB — TROPONIN I: Troponin I: <0.03 ng/mL (ref <0.03)

## 2016-11-28 MED ORDER — AMLODIPINE BESYLATE 5 MG PO TABS
5.0000 mg | ORAL_TABLET | Freq: Every day | ORAL | Status: DC
  Administered 2016-11-28: 5 mg via ORAL
  Filled 2016-11-28: qty 1

## 2016-11-28 MED ORDER — NITROGLYCERIN 0.3 MG SL SUBL
0.3000 mg | SUBLINGUAL_TABLET | SUBLINGUAL | Status: DC | PRN
  Filled 2016-11-28: qty 100

## 2016-11-28 MED ORDER — IBUPROFEN 600 MG PO TABS: 600.0000 mg | ORAL_TABLET | Freq: Four times a day (QID) | ORAL | Status: DC | PRN

## 2016-11-28 MED ORDER — ACETAMINOPHEN 160 MG/5ML PO SOLN: 650.0000 mg | ORAL | Status: DC | PRN

## 2016-11-28 MED ORDER — ALBUTEROL SULFATE (2.5 MG/3ML) 0.083% IN NEBU: 2.5000 mg | INHALATION_SOLUTION | Freq: Four times a day (QID) | RESPIRATORY_TRACT | Status: DC | PRN

## 2016-11-28 MED ORDER — LOSARTAN POTASSIUM 50 MG PO TABS
50.0000 mg | ORAL_TABLET | Freq: Every day | ORAL | Status: DC
  Administered 2016-11-28: 50 mg via ORAL
  Filled 2016-11-28: qty 1

## 2016-11-28 MED ORDER — ACETAMINOPHEN 650 MG RE SUPP: 650.0000 mg | RECTAL | Status: DC | PRN

## 2016-11-28 MED ORDER — HEPARIN SODIUM (PORCINE) 5000 UNIT/ML IJ SOLN
5000.0000 [IU] | Freq: Three times a day (TID) | INTRAMUSCULAR | Status: DC
  Administered 2016-11-28: 5000 [IU] via SUBCUTANEOUS
  Filled 2016-11-28: qty 1

## 2016-11-28 MED ORDER — STROKE: EARLY STAGES OF RECOVERY BOOK
Freq: Once | Status: AC
  Administered 2016-11-28: 15:00:00
  Filled 2016-11-28: qty 1

## 2016-11-28 MED ORDER — ACETAMINOPHEN 325 MG PO TABS: 650.0000 mg | ORAL_TABLET | ORAL | Status: DC | PRN

## 2016-11-28 MED ORDER — NICOTINE 21 MG/24HR TD PT24
21.0000 mg | MEDICATED_PATCH | Freq: Every day | TRANSDERMAL | Status: DC
  Administered 2016-11-28: 21 mg via TRANSDERMAL
  Filled 2016-11-28: qty 1

## 2016-11-28 NOTE — Plan of Care (Signed)
Problem: Education: Goal: Knowledge of secondary prevention will improve Outcome: Progressing Educated patient about smoking   Problem: Nutrition: Goal: Risk of aspiration will decrease Outcome: Completed/Met Date Met: 11/28/16 Pt has no difficulty swallowing and tolerates meals well. Goal: Dietary intake will improve Outcome: Completed/Met Date Met: 11/28/16 Pt placed on heart healthy diet with no issues.

## 2016-11-28 NOTE — Progress Notes (Signed)
  Echocardiogram 2D Echocardiogram has been performed.  Brittany SavoyCasey N [name] [place] 11/28/2016, 4:26 PM

## 2016-11-28 NOTE — Progress Notes (Signed)
*  PRELIMINARY RESULTS* Vascular Ultrasound Carotid Duplex (Doppler) has been completed.  Findings suggest 1-39% internal carotid artery stenosis bilaterally. Vertebral arteries are patent with antegrade flow.  11/28/2016 3:43 PM Gertie FeyMichelle Simrat Kendrick, BS, RVT, RDCS, RDMS

## 2016-11-28 NOTE — Progress Notes (Signed)
Pt returned from vascular getting ultrasound done and wanted to leave AMA due to personal reasons per the patient. MD notified and came to speak with patient in the room. Pt educated on what AMA means and signed AMA paper. Placed in the chart and patient noted she would follow up with MD outpatient. Jillyn HiddenStone,Temperance Kelemen R, RN

## 2016-11-28 NOTE — H&P (Signed)
Referring Physician:  Feliciana Narayan is an 61 y.o. female.                       Chief Complaint: Left arm numbness  HPI: 61 year old female had left arm numbness 2 weeks ago. She has occasional chest pain with shortness of breath also. She denies visual change and may had speech trouble lasting for few minutes. She denies trouble with ambulation. No fever or cough.  Past Medical History:  Diagnosis Date  . Alzheimer disease   . Hernia, abdominal   . Hypertension   . Normal cardiac stress test 2007      Past Surgical History:  Procedure Laterality Date  . ABDOMINAL HYSTERECTOMY    . CESAREAN SECTION    . CHOLECYSTECTOMY    . curretage for endometriosis  93, 94,95, 96  . GYN surgery    . HERNIA REPAIR    . OVARIAN CYST REMOVAL      Family History  Problem Relation Age of Onset  . Cancer Mother   . Stroke Mother   . COPD Mother   . Dementia Mother   . Diabetes Father   . Heart attack Father    Social History:  reports that she has been smoking Cigarettes.  She has a 15.00 pack-year smoking history. She has quit using smokeless tobacco. She reports that she drinks alcohol. She reports that she uses drugs, including Marijuana.  Allergies:  Allergies  Allergen Reactions  . Amoxicillin Hives and Other (See Comments)    Whelps Has patient had a PCN reaction causing immediate rash, facial/tongue/throat swelling, SOB or lightheadedness with hypotension: Yes welps Has patient had a PCN reaction causing severe rash involving mucus membranes or skin necrosis: No Has patient had a PCN reaction that required hospitalization No Has patient had a PCN reaction occurring within the last 10 years: Yes If all of the above answers are "NO", then may proceed with Cephalosporin use.   . Sulfonamide Derivatives Nausea And Vomiting    Medications Prior to Admission  Medication Sig Dispense Refill  . albuterol (PROVENTIL HFA;VENTOLIN HFA) 108 (90 BASE) MCG/ACT inhaler Inhale 2 puffs into  the lungs every 6 (six) hours as needed for wheezing or shortness of breath. 1 Inhaler 0  . ibuprofen (ADVIL,MOTRIN) 600 MG tablet Take 1 tablet (600 mg total) by mouth every 6 (six) hours as needed. 30 tablet 0  . losartan (COZAAR) 50 MG tablet Take 50 mg by mouth daily.    . nitroGLYCERIN (NITROSTAT) 0.3 MG SL tablet Place 0.3 mg under the tongue every 5 (five) minutes as needed for chest pain (chest pain).     Marland Kitchen amLODipine (NORVASC) 5 MG tablet Take 5 mg by mouth daily.     . [DISCONTINUED] clindamycin (CLEOCIN) 150 MG capsule Take 3 capsules (450 mg total) by mouth 3 (three) times daily. (Patient not taking: Reported on 11/28/2016) 90 capsule 0  . [DISCONTINUED] HYDROcodone-acetaminophen (NORCO/VICODIN) 5-325 MG tablet Take 1 tablet by mouth every 4 (four) hours as needed. (Patient not taking: Reported on 11/28/2016) 20 tablet 0  . [DISCONTINUED] naproxen (NAPROSYN) 500 MG tablet Take 1 tablet (500 mg total) by mouth 2 (two) times daily. (Patient not taking: Reported on 11/28/2016) 30 tablet 0  . [DISCONTINUED] oxyCODONE-acetaminophen (PERCOCET/ROXICET) 5-325 MG tablet Take 1 tablet by mouth every 4 (four) hours as needed for severe pain. (Patient not taking: Reported on 11/28/2016) 18 tablet 0    No results found for this  or any previous visit (from the past 48 hour(s)). No results found.  Review Of Systems Constitutional: No fever, chills, weight loss or gain. Eyes: No vision change, wears glasses. No discharge or pain. Ears: No hearing loss, No tinnitus. Respiratory: Positive asthma, COPD, pneumonias. No shortness of breath. No hemoptysis. Cardiovascular: Positive chest pain, palpitation, no leg edema. Gastrointestinal: No nausea, vomiting, diarrhea, constipation. No GI bleed. No hepatitis. Genitourinary: No dysuria, hematuria, kidney stone. No incontinance. Neurological: Positive headache, no stroke, seizures.  Psychiatry: No psych facility admission for anxiety, depression, suicide. No  detox. Skin: No rash. Musculoskeletal: Positive joint pain, fibromyalgia. No neck pain, back pain. Lymphadenopathy: No lymphadenopathy. Hematology: No anemia or easy bruising.   Blood pressure (!) 147/85, pulse 90, temperature 98.3 F (36.8 C), temperature source Oral, resp. rate 17, height 5\' 2"  (1.575 m), weight 52.6 kg (116 lb), SpO2 98 %. Body mass index is 21.22 kg/m. General appearance: alert, cooperative, appears stated age and no distress Head: Normocephalic, atraumatic. Eyes: Brown/Blue/Hazel eyes, pink conjunctiva, corneas clear. PERRL, EOM's intact. Neck: No adenopathy, no carotid bruit, no JVD, supple, symmetrical, trachea midline and thyroid not enlarged. Resp: Clear to auscultation bilaterally. Cardio: Regular rate and rhythm, S1, S2 normal, II/VI systolic murmur, no click, rub or gallop GI: Soft, non-tender; bowel sounds normal; no organomegaly. Extremities: No edema, cyanosis or clubbing. Bilateral shoulder abductions are 80 degrees. Skin: Warm and dry.  Neurologic: Alert and oriented X 3, normal strength. Normal coordination and gait.  Assessment/Plan Left arm numbness Chest pain Bilateral frozen shoulder Tobacco use disorder Hypertension COPD CAD  Place in observation R/O stroke.  Ricki RodriguezKADAKIA,Mariusz Jubb S, MD  11/28/2016, 12:25 PM

## 2016-11-29 LAB — HIV ANTIBODY (ROUTINE TESTING W REFLEX): HIV Screen 4th Generation wRfx: NONREACTIVE

## 2016-11-30 NOTE — Discharge Summary (Signed)
Physician Discharge Summary  Patient ID: Brittany [name] MRN: [id number] DOB/AGE: December 16, 1955 61 y.o.  Admit date: 11/28/2016 Discharge date: 11/28/2016  Admission Diagnoses:  Left arm numbness Chest pain Bilateral frozen shoulder Tobacco use disorder Hypertension COPD CAD  Discharge Diagnoses:  Principal Problem:   Left arm numbness Active Problems:   TOBACCO USE DISORDER   Alcohol use disorder   Chest pain   Malnutrition of moderate degree (HCC)   COPD   CAD   Hypertension   Bilateral frozen shoulder    Discharged Condition: fair  Hospital Course: 62 year old female c/o left arm numbness 2 weeks ago. She also had chest pain with shortness of breath. She denied any vision change but feels may had speech problem for few minutes. She denied trouble with ambulation. She had normal LV function on  Ehocardiogram, Carotid doppler without any significant narrowing and normal troponin I. Chest x-ray showed chronic bronchitic changes. She had to leave AMA for personal reasons before MRI of head was carried out.  Consults: cardiology  Significant Diagnostic Studies: labs: Near normal CBC, CMET showed mildly low total protein level and mildly high AST and ALT consistent with alcohol use. Troponin I was normal.  EKG- Normal.  Chest X-ray: Chronic bronchitic changes.  Echocardiogram: Normal LV systolic function. Mild diastolic dysfunction. Trace AI.  Treatments: cardiac meds: amlodipine and Losartan.  Discharge Exam: Blood pressure (!) 147/85, pulse 90, temperature 98.3 F (36.8 C), temperature source Oral, resp. rate 17, height  (1.575 m), weight 52.6 kg (116 lb), SpO2 98 %. General appearance: alert, cooperative and appears stated age. Head: Normocephalic, atraumatic. Eyes: Brown eyes, pink conjunctiva, corneas clear. PERRL, EOM'[name] intact.  Neck: No adenopathy, no carotid bruit, no JVD, supple, symmetrical, trachea midline and thyroid not enlarged. Resp: Clear to  auscultation bilaterally. Cardio: Regular rate and rhythm, S1, S2 normal, II/VI systolic murmur, no click, rub or gallop. GI: Soft, non-tender; bowel sounds normal; no organomegaly. Extremities: No edema, cyanosis or clubbing. Skin: Warm and dry.  Neurologic: Alert and oriented X 3, normal strength and tone. Normal coordination and gait.  Disposition: 07-Left Against Medical Advice/Left Without Being Seen/Elopement   Allergies as of 11/28/2016      Reactions   Amoxicillin Hives, Other (See Comments)   Whelps Has patient had a PCN reaction causing immediate rash, facial/tongue/throat swelling, SOB or lightheadedness with hypotension: Yes welps Has patient had a PCN reaction causing severe rash involving mucus membranes or skin necrosis: Yes Has patient had a PCN reaction that required hospitalization No Has patient had a PCN reaction occurring within the last 10 years: No If all of the above answers are "NO", then may proceed with Cephalosporin use.   Sulfonamide Derivatives Nausea And Vomiting      Medication List    TAKE these medications   albuterol 108 (90 Base) MCG/ACT inhaler Commonly known as:  PROVENTIL HFA;VENTOLIN HFA Inhale 2 puffs into the lungs every 6 (six) hours as needed for wheezing or shortness of breath.   amLODipine 5 MG tablet Commonly known as:  NORVASC Take 5 mg by mouth daily.   ibuprofen 600 MG tablet Commonly known as:  ADVIL,MOTRIN Take 1 tablet (600 mg total) by mouth every 6 (six) hours as needed.   losartan 50 MG tablet Commonly known as:  COZAAR Take 50 mg by mouth daily.   nitroGLYCERIN 0.3 MG SL tablet Commonly known as:  NITROSTAT Place 0.3 mg under the tongue every 5 (five) minutes as needed for chest pain (  chest pain).        SignedOrpah [name]: Brittany [name] 11/30/2016, 9:44 AM

## 2017-01-19 DIAGNOSIS — J449 Chronic obstructive pulmonary disease, unspecified: Secondary | ICD-10-CM

## 2017-01-19 DIAGNOSIS — J189 Pneumonia, unspecified organism: Secondary | ICD-10-CM

## 2017-01-19 DIAGNOSIS — K769 Liver disease, unspecified: Secondary | ICD-10-CM

## 2017-01-19 HISTORY — DX: Liver disease, unspecified: K76.9

## 2017-01-19 HISTORY — DX: Chronic obstructive pulmonary disease, unspecified: J44.9

## 2017-01-19 HISTORY — DX: Pneumonia, unspecified organism: J18.9

## 2017-01-27 ENCOUNTER — Emergency Department (HOSPITAL_COMMUNITY): Payer: Medicare HMO

## 2017-01-27 ENCOUNTER — Inpatient Hospital Stay (HOSPITAL_COMMUNITY)
Admission: EM | Admit: 2017-01-27 | Discharge: 2017-02-02 | DRG: 190 | Disposition: A | Discharge status: Home / Self Care | Payer: Medicare HMO | Attending: Internal Medicine | Admitting: Internal Medicine

## 2017-01-27 ENCOUNTER — Other Ambulatory Visit: Payer: Self-pay

## 2017-01-27 ENCOUNTER — Encounter (HOSPITAL_COMMUNITY): Payer: Self-pay | Admitting: Emergency Medicine

## 2017-01-27 DIAGNOSIS — Z880 Allergy status to penicillin: Secondary | ICD-10-CM

## 2017-01-27 DIAGNOSIS — Z882 Allergy status to sulfonamides status: Secondary | ICD-10-CM | POA: Diagnosis not present

## 2017-01-27 DIAGNOSIS — I110 Hypertensive heart disease with heart failure: Secondary | ICD-10-CM | POA: Diagnosis present

## 2017-01-27 DIAGNOSIS — F17210 Nicotine dependence, cigarettes, uncomplicated: Secondary | ICD-10-CM | POA: Diagnosis present

## 2017-01-27 DIAGNOSIS — J441 Chronic obstructive pulmonary disease with (acute) exacerbation: Secondary | ICD-10-CM | POA: Diagnosis present

## 2017-01-27 DIAGNOSIS — I5032 Chronic diastolic (congestive) heart failure: Secondary | ICD-10-CM | POA: Diagnosis present

## 2017-01-27 DIAGNOSIS — Z825 Family history of asthma and other chronic lower respiratory diseases: Secondary | ICD-10-CM | POA: Diagnosis not present

## 2017-01-27 DIAGNOSIS — R000 Tachycardia, unspecified: Secondary | ICD-10-CM | POA: Diagnosis not present

## 2017-01-27 DIAGNOSIS — F17200 Nicotine dependence, unspecified, uncomplicated: Secondary | ICD-10-CM | POA: Diagnosis present

## 2017-01-27 DIAGNOSIS — J189 Pneumonia, unspecified organism: Secondary | ICD-10-CM

## 2017-01-27 DIAGNOSIS — F0280 Dementia in other diseases classified elsewhere without behavioral disturbance: Secondary | ICD-10-CM | POA: Diagnosis present

## 2017-01-27 DIAGNOSIS — I10 Essential (primary) hypertension: Secondary | ICD-10-CM

## 2017-01-27 DIAGNOSIS — Z23 Encounter for immunization: Secondary | ICD-10-CM

## 2017-01-27 DIAGNOSIS — R0902 Hypoxemia: Secondary | ICD-10-CM | POA: Diagnosis not present

## 2017-01-27 DIAGNOSIS — E876 Hypokalemia: Secondary | ICD-10-CM | POA: Diagnosis present

## 2017-01-27 DIAGNOSIS — Z881 Allergy status to other antibiotic agents status: Secondary | ICD-10-CM | POA: Diagnosis not present

## 2017-01-27 DIAGNOSIS — G309 Alzheimer's disease, unspecified: Secondary | ICD-10-CM | POA: Diagnosis present

## 2017-01-27 DIAGNOSIS — J9601 Acute respiratory failure with hypoxia: Secondary | ICD-10-CM

## 2017-01-27 DIAGNOSIS — J440 Chronic obstructive pulmonary disease with acute lower respiratory infection: Secondary | ICD-10-CM | POA: Diagnosis present

## 2017-01-27 LAB — CBC
HEMATOCRIT: 43.4 % (ref 36.0–46.0)
HEMOGLOBIN: 15.4 g/dL — AB (ref 12.0–15.0)
MCH: 31.3 pg (ref 26.0–34.0)
MCHC: 35.5 g/dL (ref 30.0–36.0)
MCV: 88.2 fL (ref 78.0–100.0)
Platelets: 219 10*3/uL (ref 150–400)
RBC: 4.92 MIL/uL (ref 3.87–5.11)
RDW: 13.5 % (ref 11.5–15.5)
WBC: 9.3 10*3/uL (ref 4.0–10.5)

## 2017-01-27 LAB — BASIC METABOLIC PANEL
ANION GAP: 13 (ref 5–15)
BUN: 16 mg/dL (ref 6–20)
CHLORIDE: 97 mmol/L — AB (ref 101–111)
CO2: 23 mmol/L (ref 22–32)
CREATININE: 0.78 mg/dL (ref 0.44–1.00)
Calcium: 8.8 mg/dL — ABNORMAL LOW (ref 8.9–10.3)
Glucose, Bld: 178 mg/dL — ABNORMAL HIGH (ref 65–99)
Potassium: 3.1 mmol/L — ABNORMAL LOW (ref 3.5–5.1)
SODIUM: 133 mmol/L — AB (ref 135–145)

## 2017-01-27 LAB — I-STAT TROPONIN, ED: Troponin i, poc: 0 ng/mL (ref 0.00–0.08)

## 2017-01-27 LAB — I-STAT CG4 LACTIC ACID, ED: Lactic Acid, Venous: 2.96 mmol/L (ref 0.5–1.9)

## 2017-01-27 LAB — INFLUENZA PANEL BY PCR (TYPE A & B)
INFLAPCR: NEGATIVE
Influenza B By PCR: NEGATIVE

## 2017-01-27 MED ORDER — DEXTROSE 5 % IV SOLN: 500.0000 mg | Freq: Once | INTRAVENOUS | Status: DC

## 2017-01-27 MED ORDER — ONDANSETRON HCL 4 MG PO TABS: 4.0000 mg | ORAL_TABLET | Freq: Four times a day (QID) | ORAL | Status: DC | PRN

## 2017-01-27 MED ORDER — SODIUM CHLORIDE 0.9 % IV BOLUS (SEPSIS)
1000.0000 mL | Freq: Once | INTRAVENOUS | Status: AC
  Administered 2017-01-27: 1000 mL via INTRAVENOUS

## 2017-01-27 MED ORDER — HYDROXYZINE HCL 10 MG PO TABS
10.0000 mg | ORAL_TABLET | Freq: Four times a day (QID) | ORAL | Status: DC | PRN
  Administered 2017-01-28 – 2017-02-01 (×7): 10 mg via ORAL
  Filled 2017-01-27 (×8): qty 1

## 2017-01-27 MED ORDER — ALBUTEROL (5 MG/ML) CONTINUOUS INHALATION SOLN
10.0000 mg/h | INHALATION_SOLUTION | Freq: Once | RESPIRATORY_TRACT | Status: AC
  Administered 2017-01-27: 10 mg/h via RESPIRATORY_TRACT
  Filled 2017-01-27: qty 20

## 2017-01-27 MED ORDER — KETOROLAC TROMETHAMINE 30 MG/ML IJ SOLN
30.0000 mg | Freq: Four times a day (QID) | INTRAMUSCULAR | Status: AC | PRN
  Administered 2017-01-28 – 2017-02-01 (×15): 30 mg via INTRAVENOUS
  Filled 2017-01-27 (×16): qty 1

## 2017-01-27 MED ORDER — LABETALOL HCL 5 MG/ML IV SOLN
5.0000 mg | INTRAVENOUS | Status: DC | PRN
  Administered 2017-01-29 – 2017-01-30 (×5): 5 mg via INTRAVENOUS
  Filled 2017-01-27 (×5): qty 4

## 2017-01-27 MED ORDER — INFLUENZA VAC SPLIT QUAD 0.5 ML IM SUSY
0.5000 mL | PREFILLED_SYRINGE | INTRAMUSCULAR | Status: AC
  Administered 2017-01-31: 0.5 mL via INTRAMUSCULAR
  Filled 2017-01-27: qty 0.5

## 2017-01-27 MED ORDER — ACETAMINOPHEN 650 MG RE SUPP: 650.0000 mg | Freq: Four times a day (QID) | RECTAL | Status: DC | PRN

## 2017-01-27 MED ORDER — POTASSIUM CHLORIDE CRYS ER 20 MEQ PO TBCR
40.0000 meq | EXTENDED_RELEASE_TABLET | Freq: Two times a day (BID) | ORAL | Status: AC
  Administered 2017-01-27 – 2017-01-28 (×2): 40 meq via ORAL
  Filled 2017-01-27 (×2): qty 2

## 2017-01-27 MED ORDER — LEVOFLOXACIN IN D5W 750 MG/150ML IV SOLN
750.0000 mg | INTRAVENOUS | Status: DC
  Administered 2017-01-27 – 2017-01-31 (×5): 750 mg via INTRAVENOUS
  Filled 2017-01-27 (×5): qty 150

## 2017-01-27 MED ORDER — ALBUTEROL SULFATE (2.5 MG/3ML) 0.083% IN NEBU
5.0000 mg | INHALATION_SOLUTION | Freq: Once | RESPIRATORY_TRACT | Status: AC
  Administered 2017-01-27: 5 mg via RESPIRATORY_TRACT
  Filled 2017-01-27: qty 6

## 2017-01-27 MED ORDER — METHYLPREDNISOLONE SODIUM SUCC 125 MG IJ SOLR
125.0000 mg | Freq: Once | INTRAMUSCULAR | Status: AC
  Administered 2017-01-27: 125 mg via INTRAVENOUS
  Filled 2017-01-27: qty 2

## 2017-01-27 MED ORDER — ONDANSETRON HCL 4 MG/2ML IJ SOLN
4.0000 mg | Freq: Four times a day (QID) | INTRAMUSCULAR | Status: DC | PRN
  Administered 2017-01-27: 4 mg via INTRAVENOUS
  Filled 2017-01-27: qty 2

## 2017-01-27 MED ORDER — ACETAMINOPHEN 325 MG PO TABS
650.0000 mg | ORAL_TABLET | Freq: Four times a day (QID) | ORAL | Status: DC | PRN
  Administered 2017-01-30 – 2017-02-02 (×7): 650 mg via ORAL
  Filled 2017-01-27 (×7): qty 2

## 2017-01-27 MED ORDER — IPRATROPIUM-ALBUTEROL 0.5-2.5 (3) MG/3ML IN SOLN
3.0000 mL | RESPIRATORY_TRACT | Status: DC | PRN
  Administered 2017-01-29: 3 mL via RESPIRATORY_TRACT
  Filled 2017-01-27 (×2): qty 3

## 2017-01-27 MED ORDER — DEXTROSE 5 % IV SOLN
1.0000 g | Freq: Once | INTRAVENOUS | Status: DC
  Filled 2017-01-27: qty 10

## 2017-01-27 MED ORDER — LEVOFLOXACIN 750 MG PO TABS: 750.0000 mg | ORAL_TABLET | ORAL | Status: DC

## 2017-01-27 MED ORDER — METHYLPREDNISOLONE SODIUM SUCC 125 MG IJ SOLR
60.0000 mg | Freq: Two times a day (BID) | INTRAMUSCULAR | Status: DC
  Administered 2017-01-28: 60 mg via INTRAVENOUS
  Filled 2017-01-27: qty 2

## 2017-01-27 MED ORDER — LOSARTAN POTASSIUM 50 MG PO TABS
50.0000 mg | ORAL_TABLET | Freq: Every day | ORAL | Status: DC
  Administered 2017-01-27 – 2017-02-02 (×7): 50 mg via ORAL
  Filled 2017-01-27 (×7): qty 1

## 2017-01-27 MED ORDER — BENZONATATE 100 MG PO CAPS
100.0000 mg | ORAL_CAPSULE | Freq: Three times a day (TID) | ORAL | Status: DC | PRN
  Administered 2017-01-28 – 2017-02-02 (×10): 100 mg via ORAL
  Filled 2017-01-27 (×12): qty 1

## 2017-01-27 MED ORDER — ENOXAPARIN SODIUM 40 MG/0.4ML ~~LOC~~ SOLN
40.0000 mg | SUBCUTANEOUS | Status: DC
  Administered 2017-01-27 – 2017-02-01 (×6): 40 mg via SUBCUTANEOUS
  Filled 2017-01-27 (×6): qty 0.4

## 2017-01-27 MED ORDER — IPRATROPIUM-ALBUTEROL 0.5-2.5 (3) MG/3ML IN SOLN
3.0000 mL | Freq: Four times a day (QID) | RESPIRATORY_TRACT | Status: DC
  Administered 2017-01-27 – 2017-01-28 (×3): 3 mL via RESPIRATORY_TRACT
  Filled 2017-01-27 (×2): qty 3

## 2017-01-27 NOTE — ED Notes (Signed)
Bed: WA14 Expected date:  Expected time:  Means of arrival:  Comments: Hold for Resus B 

## 2017-01-27 NOTE — ED Notes (Signed)
MD aware of abnormal lab results

## 2017-01-27 NOTE — Progress Notes (Signed)
Call report to Morrie Sheldonshley [phone] @ 1805. Brittany [name], Brittany [name]

## 2017-01-27 NOTE — ED Provider Notes (Signed)
[facility]-EMERGENCY DEPT Provider Note   CSN: [id number] Arrival date & time: 01/27/17  1308     History   Chief Complaint Chief Complaint  Patient presents with  . Shortness of Breath    HPI Brittany [name] is a 61 y.o. female.  HPI  61 y.o. female with a hx of HTN, presents to the Emergency Department today due to shortness of breath x 1 week. Noted productive cough ever since daughter left her house. Notes daughter has been sick recently. Denies fevers. No CP/ABD pain. No diaphoresis. No N/V. No numbness/tingling. Notes increased WOB with exertion. Pt does smoke. Denies pain currently. Pt does not use oxygen at home. No hx ACS. No hx DVT/PE. No recent travel. No recent surgeries. No other symptoms noted     Past Medical History:  Diagnosis Date  . Alzheimer disease   . Hernia, abdominal   . Hypertension   . Normal cardiac stress test 2007    Patient Active Problem List   Diagnosis Date Noted  . Left arm numbness 11/28/2016    Class: Chronic  . Nondisplaced transverse fracture of left patella, initial encounter for closed fracture 06/10/2016  . Malnutrition of moderate degree (HCC) 07/12/2013  . Syncope 07/11/2013  . GASTROENTERITIS 05/07/2010  . TOBACCO ABUSE 07/01/2009    Past Surgical History:  Procedure Laterality Date  . ABDOMINAL HYSTERECTOMY    . CESAREAN SECTION    . CHOLECYSTECTOMY    . curretage for endometriosis  93, 94,95, 96  . GYN surgery    . HERNIA REPAIR    . OVARIAN CYST REMOVAL      OB History    No data available       Home Medications    Prior to Admission medications   Medication Sig Start Date End Date Taking? Authorizing Provider  albuterol (PROVENTIL HFA;VENTOLIN HFA) 108 (90 BASE) MCG/ACT inhaler Inhale 2 puffs into the lungs every 6 (six) hours as needed for wheezing or shortness of breath. 04/29/14   Mellody DrownParker, Lauren, PA-C  amLODipine (NORVASC) 5 MG tablet Take 5 mg by mouth daily.  04/27/15   [provider]  ibuprofen (ADVIL,MOTRIN) 600 MG tablet Take 1 tablet (600 mg total) by mouth every 6 (six) hours as needed. 06/08/16   Ward, Chase PicketJaime Pilcher, PA-C  losartan (COZAAR) 50 MG tablet Take 50 mg by mouth daily.    [provider]  nitroGLYCERIN (NITROSTAT) 0.3 MG SL tablet Place 0.3 mg under the tongue every 5 (five) minutes as needed for chest pain (chest pain).     [provider]    Family History Family History  Problem Relation Age of Onset  . Cancer Mother   . Stroke Mother   . COPD Mother   . Dementia Mother   . Diabetes Father   . Heart attack Father     Social History Social History   Tobacco Use  . Smoking status: Current Every Day Smoker    Packs/day: 0.50    Years: 30.00    Pack years: 15.00    Types: Cigarettes  . Smokeless tobacco: Former Engineer, waterUser  Substance Use Topics  . Alcohol use: Yes    Comment: occasional  . Drug use: Yes    Types: Marijuana    Comment: occasionally     Allergies   Amoxicillin and Sulfonamide derivatives   Review of Systems Review of Systems ROS reviewed and all are negative for acute change except as noted in the HPI.  Physical  Exam Updated Vital Signs BP (!) 144/79 (BP Location: Right Arm)   Pulse (!) 115   Temp 98.1 F (36.7 C) (Oral)   Resp (!) 28   Ht 5\' 2"  (1.575 m)   Wt 51.3 kg (113 lb)   SpO2 92%   BMI 20.67 kg/m   Physical Exam  Constitutional: She is oriented to person, place, and time. She appears well-developed and well-nourished. No distress.  HENT:  Head: Normocephalic and atraumatic.  Right Ear: Tympanic membrane, external ear and ear canal normal.  Left Ear: Tympanic membrane, external ear and ear canal normal.  Nose: Nose normal.  Mouth/Throat: Uvula is midline, oropharynx is clear and moist and mucous membranes are normal. No trismus in the jaw. No oropharyngeal exudate, posterior oropharyngeal erythema or tonsillar abscesses.  Eyes: EOM are normal. Pupils are equal, round,  and reactive to light.  Neck: Normal range of motion. Neck supple. No tracheal deviation present.  Cardiovascular: Regular rhythm, S1 normal, S2 normal, normal heart sounds, intact distal pulses and normal pulses. Tachycardia present.  Pulmonary/Chest: Accessory muscle usage present. Tachypnea noted. No respiratory distress. She has no decreased breath sounds. She has wheezes in the right upper field, the right lower field, the left upper field and the left lower field. She has rhonchi in the right lower field and the left lower field. She has no rales.  Increase WOB. Accessory muscle usage   Abdominal: Normal appearance and bowel sounds are normal. There is no tenderness.  Musculoskeletal: Normal range of motion.  Neurological: She is alert and oriented to person, place, and time.  Skin: Skin is warm and dry.  Psychiatric: She has a normal mood and affect. Her speech is normal and behavior is normal. Thought content normal.  Nursing note and vitals reviewed.    ED Treatments / Results  Labs (all labs ordered are listed, but only abnormal results are displayed) Labs Reviewed  CBC - Abnormal; Notable for the following components:      Result Value   Hemoglobin 15.4 (*)    All other components within normal limits  BASIC METABOLIC PANEL - Abnormal; Notable for the following components:   Sodium 133 (*)    Potassium 3.1 (*)    Chloride 97 (*)    Glucose, Bld 178 (*)    Calcium 8.8 (*)    All other components within normal limits  I-STAT CG4 LACTIC ACID, ED - Abnormal; Notable for the following components:   Lactic Acid, Venous 2.96 (*)    All other components within normal limits  CULTURE, BLOOD (ROUTINE X 2)  CULTURE, BLOOD (ROUTINE X 2)  INFLUENZA PANEL BY PCR (TYPE A & B)  I-STAT TROPONIN, ED  I-STAT CG4 LACTIC ACID, ED    EKG  EKG Interpretation  Date/Time:  Friday January 27 2017 13:26:56 EST Ventricular Rate:  114 PR Interval:    QRS Duration: 82 QT  Interval:  323 QTC Calculation: 445 R Axis:   67 Text Interpretation:  Sinus tachycardia Atrial premature complex Probable left atrial enlargement No significant change since last tracing Confirmed by Gwyneth Sproutlunkett, Whitney ([id number]) on 01/27/2017 2:23:55 PM       Radiology Dg Chest 2 View  Result Date: 01/27/2017 CLINICAL DATA:  Shortness of breath. EXAM: CHEST  2 VIEW COMPARISON:  Chest radiograph 11/28/2016. FINDINGS: Monitoring leads overlie the patient. Stable cardiac and mediastinal contours. Interval worsening diffuse bilateral coarse interstitial pulmonary opacities. No pleural effusion or pneumothorax. Thoracic spine degenerative changes. IMPRESSION: Interval development of diffuse  bilateral coarse interstitial pulmonary opacities which may represent atypical infectious process or edema. Electronically Signed   By: Annia Belt M.D.   On: 01/27/2017 16:54    Procedures Procedures (including critical care time)  Medications Ordered in ED Medications  levofloxacin (LEVAQUIN) IVPB 750 mg (750 mg Intravenous New Bag/Given 01/27/17 1522)  albuterol (PROVENTIL) (2.5 MG/3ML) 0.083% nebulizer solution 5 mg (5 mg Nebulization Given 01/27/17 1328)  sodium chloride 0.9 % bolus 1,000 mL (0 mLs Intravenous Stopped 01/27/17 1510)  sodium chloride 0.9 % bolus 1,000 mL (0 mLs Intravenous Stopped 01/27/17 1624)  albuterol (PROVENTIL,VENTOLIN) solution continuous neb (10 mg/hr Nebulization Given 01/27/17 1453)  methylPREDNISolone sodium succinate (SOLU-MEDROL) 125 mg/2 mL injection 125 mg (125 mg Intravenous Given 01/27/17 1616)     Initial Impression / Assessment and Plan / ED Course  I have reviewed the triage vital signs and the nursing notes.  Pertinent labs & imaging results that were available during my care of the patient were reviewed by me and considered in my medical decision making (see chart for details).  Final Clinical Impressions(s) / ED Diagnoses  {I have reviewed and evaluated the relevant  laboratory values. {I have reviewed and evaluated the relevant imaging studies. {I have interpreted the relevant EKG. {I have reviewed the relevant previous healthcare records.  {I obtained HPI from historian. {Patient discussed with supervising physician.  ED Course:  Assessment: Pt is a 61 y.o. female  with a hx of Alzheimer's Disease, HTN, presents to the Emergency Department today due to shortness of breath x 1 week. Noted productive cough ever since daughter left her house. Notes daughter has been sick recently. Denies fevers. No CP/ABD pain. No diaphoresis. No N/V. No numbness/tingling. Notes increased WOB with exertion. Pt does smoke. Denies pain currently. Pt does not use oxygen at home. No hx ACS. No hx DVT/PE. No recent travel. No recent surgeries. On exam, pt in NAD. Nontoxic/nonseptic appearing. VS with tachycardia (likely 2/2 albuterol). Borderline oxygen requirement with O2 saturations 90% on RA. Given 2L [place]. Afebrile. Lungs diffuse wheeze and rhoncorous. Abdomen nontender soft. istat lactic 2.96. istat trop negative. CBC unremarkable. Given Levaquin for suspect respiratory source of infection in ED with secondary COPD exacerbation. CXR with bilateral pulmonary opacities. Given NS bolus 2L. Plan is to Admit.   Disposition/Plan:  Admit Pt acknowledges and agrees with plan  Supervising Physician Gwyneth Sprout, MD  Final diagnoses:  COPD exacerbation [facility])    ED Discharge Orders    None       Audry Pili, PA-C 01/27/17 1707    Gwyneth Sprout, MD 01/28/17 [phone]

## 2017-01-27 NOTE — ED Triage Notes (Addendum)
Patient c/o worsening shortness of breath and cough for past week. Patient reports her daughter visited about a week ago with a cough then began having symptoms. Pt admits to smoking half a pack a day except has not been able to for past 2 days due to difficulty breathing. Pt ambulatory but labored breathing with exertion. C/o productive cough of yellow thick mucus. Now having back pain associated with cough. Patient speaking in full sentences.

## 2017-01-27 NOTE — H&P (Signed)
History and Physical    Brittany [name] ONG:[id number] DOB: 1955-07-27 DOA: 01/27/2017  PCP: Orpah CobbKadakia, Ajay, MD  Patient coming from: Home  Chief Complaint: SOB, wheezing, coughing  HPI: Brittany [name] is a 61 y.o. female with medical history significant of active tobacco abuse, HTN who presented to the ED with on week of worsening cough and SOB with wheezing. Patient's daughter was recently discharged from hospital for treatment for PNA. Patient is home O2 naive prior to admission. Patient without fevers or chills, however does describe post-tussive emesis.  ED Course: In the ED, CXR was performed with findings of interval development of diffuse B coarse interstitial opacities. No leukocytosis. Afebrile. Flu swab was found to be neg. Patient was started on empiric Levaquin. Hospitalist service consulted for consideration for admission.  Review of Systems:  Review of Systems  Constitutional: Negative for chills, fever and weight loss.  HENT: Negative for congestion, ear pain, nosebleeds and tinnitus.   Eyes: Negative for double vision, photophobia and pain.  Respiratory: Positive for cough, shortness of breath and wheezing. Negative for sputum production.   Cardiovascular: Negative for palpitations, orthopnea and claudication.  Gastrointestinal: Positive for nausea and vomiting. Negative for constipation and melena.  Genitourinary: Negative for frequency, hematuria and urgency.  Musculoskeletal: Negative for back pain, falls and neck pain.  Neurological: Negative for tingling, tremors and seizures.  Psychiatric/Behavioral: Negative for hallucinations, memory loss and substance abuse. The patient does not have insomnia.     Past Medical History:  Diagnosis Date  . Alzheimer disease   . Hernia, abdominal   . Hypertension   . Normal cardiac stress test 2007    Past Surgical History:  Procedure Laterality Date  . ABDOMINAL HYSTERECTOMY    . CESAREAN SECTION    . CHOLECYSTECTOMY    .  curretage for endometriosis  93, 94,95, 96  . GYN surgery    . HERNIA REPAIR    . OVARIAN CYST REMOVAL       reports that she has been smoking cigarettes.  She has a 15.00 pack-year smoking history. She has quit using smokeless tobacco. She reports that she drinks alcohol. She reports that she uses drugs. Drug: Marijuana.    Family History  Problem Relation Age of Onset  . Cancer Mother   . Stroke Mother   . COPD Mother   . Dementia Mother   . Diabetes Father   . Heart attack Father     Prior to Admission medications   Medication Sig Start Date End Date Taking? Authorizing Provider  nitroGLYCERIN (NITROSTAT) 0.3 MG SL tablet Place 0.3 mg under the tongue every 5 (five) minutes as needed for chest pain (chest pain).    Yes [provider]  albuterol (PROVENTIL HFA;VENTOLIN HFA) 108 (90 BASE) MCG/ACT inhaler Inhale 2 puffs into the lungs every 6 (six) hours as needed for wheezing or shortness of breath. Patient not taking: Reported on 01/27/2017 04/29/14   Mellody DrownParker, Lauren, PA-C  ibuprofen (ADVIL,MOTRIN) 600 MG tablet Take 1 tablet (600 mg total) by mouth every 6 (six) hours as needed. Patient not taking: Reported on 01/27/2017 06/08/16   Ward, Chase PicketJaime Pilcher, PA-C  losartan (COZAAR) 50 MG tablet Take 50 mg by mouth daily.    [provider]    Physical Exam: Vitals:   01/27/17 1430 01/27/17 1453 01/27/17 1500 01/27/17 1524  BP: (!) 177/87   (!) 158/84  Pulse: (!) 126  (!) 124 (!) 133  Resp: (!) 37  20 (!) 22  Temp:  TempSrc:      SpO2: 95% 90%  91%  Weight:      Height:        Constitutional: NAD, calm, comfortable Vitals:   01/27/17 1430 01/27/17 1453 01/27/17 1500 01/27/17 1524  BP: (!) 177/87   (!) 158/84  Pulse: (!) 126  (!) 124 (!) 133  Resp: (!) 37  20 (!) 22  Temp:      TempSrc:      SpO2: 95% 90%  91%  Weight:      Height:       Eyes: PERRL, lids and conjunctivae normal ENMT: Mucous membranes are moist. Posterior pharynx clear of any  exudate or lesions.Normal dentition.  Neck: normal, supple, no masses, no thyromegaly Respiratory: poor inspiratory effort, decreased BS, no audible wheezing  Cardiovascular: Regular rate and rhythm, no murmurs / rubs / gallops. No extremity edema. 2+ pedal pulses. No carotid bruits.  Abdomen: no tenderness, no masses palpated. No hepatosplenomegaly. Bowel sounds positive.  Musculoskeletal: no clubbing / cyanosis. No joint deformity upper and lower extremities. Good ROM, no contractures. Normal muscle tone.  Skin: no rashes, lesions, ulcers. No induration Neurologic: CN 2-12 grossly intact. Sensation intact, DTR normal. Strength 5/5 in all 4.  Psychiatric: Normal judgment and insight. Alert and oriented x 3. Normal mood.    Labs on Admission: I have personally reviewed following labs and imaging studies  CBC: Recent Labs  Lab 01/27/17 1401  WBC 9.3  HGB 15.4*  HCT 43.4  MCV 88.2  PLT 219   Basic Metabolic Panel: Recent Labs  Lab 01/27/17 1401  NA 133*  K 3.1*  CL 97*  CO2 23  GLUCOSE 178*  BUN 16  CREATININE 0.78  CALCIUM 8.8*   GFR: Estimated Creatinine Clearance: 60.6 mL/min (by C-G formula based on SCr of 0.78 mg/dL). Liver Function Tests: No results for input(s): AST, ALT, ALKPHOS, BILITOT, PROT, ALBUMIN in the last 168 hours. No results for input(s): LIPASE, AMYLASE in the last 168 hours. No results for input(s): AMMONIA in the last 168 hours. Coagulation Profile: No results for input(s): INR, PROTIME in the last 168 hours. Cardiac Enzymes: No results for input(s): CKTOTAL, CKMB, CKMBINDEX, TROPONINI in the last 168 hours. BNP (last 3 results) No results for input(s): PROBNP in the last 8760 hours. HbA1C: No results for input(s): HGBA1C in the last 72 hours. CBG: No results for input(s): GLUCAP in the last 168 hours. Lipid Profile: No results for input(s): CHOL, HDL, LDLCALC, TRIG, CHOLHDL, LDLDIRECT in the last 72 hours. Thyroid Function Tests: No results  for input(s): TSH, T4TOTAL, FREET4, T3FREE, THYROIDAB in the last 72 hours. Anemia Panel: No results for input(s): VITAMINB12, FOLATE, FERRITIN, TIBC, IRON, RETICCTPCT in the last 72 hours. Urine analysis:    Component Value Date/Time   COLORURINE YELLOW 07/11/2013 1933   APPEARANCEUR CLEAR 07/11/2013 1933   LABSPEC 1.007 07/11/2013 1933   PHURINE 8.0 07/11/2013 1933   GLUCOSEU NEGATIVE 07/11/2013 1933   HGBUR NEGATIVE 07/11/2013 1933   BILIRUBINUR NEGATIVE 07/11/2013 1933   KETONESUR NEGATIVE 07/11/2013 1933   PROTEINUR NEGATIVE 07/11/2013 1933   UROBILINOGEN 0.2 07/11/2013 1933   NITRITE NEGATIVE 07/11/2013 1933   LEUKOCYTESUR NEGATIVE 07/11/2013 1933   Sepsis Labs: !!!!!!!!!!!!!!!!!!!!!!!!!!!!!!!!!!!!!!!!!!!! @LABRCNTIP (procalcitonin:4,lacticidven:4) )No results found for this or any previous visit (from the past 240 hour(s)).   Radiological Exams on Admission: Dg Chest 2 View  Result Date: 01/27/2017 CLINICAL DATA:  Shortness of breath. EXAM: CHEST  2 VIEW COMPARISON:  Chest radiograph 11/28/2016. FINDINGS:  Monitoring leads overlie the patient. Stable cardiac and mediastinal contours. Interval worsening diffuse bilateral coarse interstitial pulmonary opacities. No pleural effusion or pneumothorax. Thoracic spine degenerative changes. IMPRESSION: Interval development of diffuse bilateral coarse interstitial pulmonary opacities which may represent atypical infectious process or edema. Electronically Signed   By: Annia Beltrew  Davis M.D.   On: 01/27/2017 16:54    EKG: Independently reviewed. Sinus tach  Assessment/Plan Principal Problem:   COPD with acute exacerbation (HCC) Active Problems:   TOBACCO ABUSE   CAP (community acquired pneumonia)   Tachycardia   COPD exacerbation (HCC)   1. COPD exacerbation 1. Decreased BS on exam with poor inspiratory effort 2. Continue on scheduled duonebs and IV steroids 3. Wean O2 as tolerated 2. CAP 1. CXR reviewed. Findings consistent with  bilateral opacities 2. No leukocytosis. Afebrile 3. Patient started on levofloxacin in the ED. Will continue 3. Sinus Tacycardia 1. Suspect made worse by continuous albuterol neb in ED 2. Will continue on PRN labetalol for HR>120 4. Active tobacco abuse 1. Cessation done at bedside. 2. Patient declines nicotine patch 5. Hypokalemia 1. Will replace  2. Repeat bmet in AM  DVT prophylaxis: lovenox subQ  Code Status: full Family Communication: Pt in room, family not at bedside  Disposition Plan: Uncertain at this time  Consults called:  Admission status: Inpatient, as would likely require greater than 2 midnight stay to treat PNA and copd   CHIU, Scheryl MartenSTEPHEN K MD [facility] Pager [phone]  If 7PM-7AM, please contact night-coverage [email or web address] Password [email or web address]  01/27/2017, 5:27 PM

## 2017-01-27 NOTE — ED Notes (Addendum)
ED TO INPATIENT HANDOFF REPORT  Name/Age/Gender Brittany [name] 61 y.o. female  Code Status    Code Status Orders  (From admission, onward)        Start     Ordered   01/27/17 1724  Full code  Continuous     01/27/17 1726    Code Status History    Date Active Date Inactive Code Status Order ID Comments User Context   11/28/2016 12:22 11/28/2016 20:35 Full Code [id number]  Dixie Dials, MD Inpatient   11/28/2016 12:20 11/28/2016 12:22 DNR [id number]  Leanne Chang, RN Inpatient   07/12/2013 00:15 07/12/2013 21:07 Full Code [id number]  Dixie Dials, MD ED      Home/SNF/Other Home  Chief Complaint SOB  Level of Care/Admitting Diagnosis ED Disposition    ED Disposition Condition [place]: [facility] [[id number]]  Level of Care: Telemetry [5]  Admit to tele based on following criteria: Complex arrhythmia (Bradycardia/Tachycardia)  Diagnosis: COPD exacerbation ([place]) [833825]  Admitting Physician: Velta Addison  Attending Physician: Donne Hazel [[id number]]  Estimated length of stay: 3 - 4 days  Certification:: I certify this patient will need inpatient services for at least 2 midnights  PT Class (Do Not Modify): Inpatient [101]  PT Acc Code (Do Not Modify): Private [1]       Medical History Past Medical History:  Diagnosis Date  . Alzheimer disease   . Hernia, abdominal   . Hypertension   . Normal cardiac stress test 2007    Allergies  IV Location/Drains/Wounds Patient Lines/Drains/Airways Status   Active Line/Drains/Airways    Name:   Placement date:   Placement time:   Site:   Days:   Peripheral IV 01/27/17 Right Antecubital   01/27/17    1400    Antecubital   less than 1          Labs/Imaging Results for orders placed or performed during the hospital encounter of 01/27/17 (from the past 48 hour(s))  CBC     Status: Abnormal   Collection Time: 01/27/17  2:01 PM  Result Value Ref Range   WBC 9.3 4.0 - 10.5  K/uL   RBC 4.92 3.87 - 5.11 MIL/uL   Hemoglobin 15.4 (H) 12.0 - 15.0 g/dL   HCT 43.4 36.0 - 46.0 %   MCV 88.2 78.0 - 100.0 fL   MCH 31.3 26.0 - 34.0 pg   MCHC 35.5 30.0 - 36.0 g/dL   RDW 13.5 11.5 - 15.5 %   Platelets 219 150 - 400 K/uL  Basic metabolic panel     Status: Abnormal   Collection Time: 01/27/17  2:01 PM  Result Value Ref Range   Sodium 133 (L) 135 - 145 mmol/L   Potassium 3.1 (L) 3.5 - 5.1 mmol/L   Chloride 97 (L) 101 - 111 mmol/L   CO2 23 22 - 32 mmol/L   Glucose, Bld 178 (H) 65 - 99 mg/dL   BUN 16 6 - 20 mg/dL   Creatinine, Ser 0.78 0.44 - 1.00 mg/dL   Calcium 8.8 (L) 8.9 - 10.3 mg/dL   GFR calc non Af Amer >60 >60 mL/min   GFR calc Af Amer >60 >60 mL/min    Comment: (NOTE) The eGFR has been calculated using the CKD EPI equation. This calculation has not been validated in all clinical situations. eGFR's persistently <60 mL/min signify possible Chronic Kidney Disease.    Anion gap 13 5 - 15  I-Stat Troponin,  ED (not at [facility])     Status: None   Collection Time: 01/27/17  2:16 PM  Result Value Ref Range   Troponin i, poc 0.00 0.00 - 0.08 ng/mL   Comment 3            Comment: Due to the release kinetics of cTnI, a negative result within the first hours of the onset of symptoms does not rule out myocardial infarction with certainty. If myocardial infarction is still suspected, repeat the test at appropriate intervals.   I-Stat CG4 Lactic Acid, ED     Status: Abnormal   Collection Time: 01/27/17  2:18 PM  Result Value Ref Range   Lactic Acid, Venous 2.96 (HH) 0.5 - 1.9 mmol/L   Comment NOTIFIED PHYSICIAN   Influenza panel by PCR (type A & B)     Status: None   Collection Time: 01/27/17  3:29 PM  Result Value Ref Range   Influenza A By PCR NEGATIVE NEGATIVE   Influenza B By PCR NEGATIVE NEGATIVE    Comment: (NOTE) The Xpert Xpress Flu assay is intended as an aid in the diagnosis of  influenza and should not be used as a sole basis for treatment.  This   assay is FDA approved for nasopharyngeal swab specimens only. Nasal  washings and aspirates are unacceptable for Xpert Xpress Flu testing.    Dg Chest 2 View  Result Date: 01/27/2017 CLINICAL DATA:  Shortness of breath. EXAM: CHEST  2 VIEW COMPARISON:  Chest radiograph 11/28/2016. FINDINGS: Monitoring leads overlie the patient. Stable cardiac and mediastinal contours. Interval worsening diffuse bilateral coarse interstitial pulmonary opacities. No pleural effusion or pneumothorax. Thoracic spine degenerative changes. IMPRESSION: Interval development of diffuse bilateral coarse interstitial pulmonary opacities which may represent atypical infectious process or edema. Electronically Signed   By: Lovey Newcomer M.D.   On: 01/27/2017 16:54    Pending Labs Unresulted Labs (From admission, onward)   Start     Ordered   02/03/17 0500  Creatinine, serum  (enoxaparin (LOVENOX)    CrCl >/= 30 ml/min)  Weekly,   R    Comments:  while on enoxaparin therapy    01/27/17 1726   01/28/17 0500  Comprehensive metabolic panel  Tomorrow morning,   R     01/27/17 1726   01/28/17 0500  CBC  Tomorrow morning,   R     01/27/17 1726   01/27/17 1723  CBC  (enoxaparin (LOVENOX)    CrCl >/= 30 ml/min)  Once,   R    Comments:  Baseline for enoxaparin therapy IF NOT ALREADY DRAWN.  Notify MD if PLT < 100 K.    01/27/17 1726   01/27/17 1723  Creatinine, serum  (enoxaparin (LOVENOX)    CrCl >/= 30 ml/min)  Once,   R    Comments:  Baseline for enoxaparin therapy IF NOT ALREADY DRAWN.    01/27/17 1726   01/27/17 1432  Blood Culture (routine x 2)  BLOOD CULTURE X 2,   STAT     01/27/17 1432      Vitals/Pain Today's Vitals   01/27/17 1453 01/27/17 1500 01/27/17 1524 01/27/17 1745  BP:   (!) 158/84 (!) 149/77  Pulse:  (!) 124 (!) 133 (!) 126  Resp:  20 (!) 22 (!) 40  Temp:      TempSrc:      SpO2: 90%  91% 95%  Weight:      Height:        Isolation Precautions  Droplet precaution  Medications Medications   levofloxacin (LEVAQUIN) IVPB 750 mg (750 mg Intravenous New Bag/Given 01/27/17 1522)  enoxaparin (LOVENOX) injection 40 mg (not administered)  acetaminophen (TYLENOL) tablet 650 mg (not administered)    Or  acetaminophen (TYLENOL) suppository 650 mg (not administered)  ketorolac (TORADOL) 30 MG/ML injection 30 mg (not administered)  ondansetron (ZOFRAN) tablet 4 mg (not administered)    Or  ondansetron (ZOFRAN) injection 4 mg (not administered)  benzonatate (TESSALON) capsule 100 mg (not administered)  ipratropium-albuterol (DUONEB) 0.5-2.5 (3) MG/3ML nebulizer solution 3 mL (not administered)  ipratropium-albuterol (DUONEB) 0.5-2.5 (3) MG/3ML nebulizer solution 3 mL (not administered)  methylPREDNISolone sodium succinate (SOLU-MEDROL) 125 mg/2 mL injection 60 mg (not administered)  labetalol (NORMODYNE,TRANDATE) injection 5 mg (not administered)  losartan (COZAAR) tablet 50 mg (not administered)  potassium chloride SA (K-DUR,KLOR-CON) CR tablet 40 mEq (not administered)  albuterol (PROVENTIL) (2.5 MG/3ML) 0.083% nebulizer solution 5 mg (5 mg Nebulization Given 01/27/17 1328)  sodium chloride 0.9 % bolus 1,000 mL (0 mLs Intravenous Stopped 01/27/17 1510)  sodium chloride 0.9 % bolus 1,000 mL (0 mLs Intravenous Stopped 01/27/17 1624)  albuterol (PROVENTIL,VENTOLIN) solution continuous neb (10 mg/hr Nebulization Given 01/27/17 1453)  methylPREDNISolone sodium succinate (SOLU-MEDROL) 125 mg/2 mL injection 125 mg (125 mg Intravenous Given 01/27/17 1616)    Mobility walks

## 2017-01-28 ENCOUNTER — Inpatient Hospital Stay (HOSPITAL_COMMUNITY): Payer: Medicare HMO

## 2017-01-28 LAB — COMPREHENSIVE METABOLIC PANEL
ALBUMIN: 3.1 g/dL — AB (ref 3.5–5.0)
ALK PHOS: 56 U/L (ref 38–126)
ALT: 14 U/L (ref 14–54)
ANION GAP: 8 (ref 5–15)
AST: 15 U/L (ref 15–41)
BILIRUBIN TOTAL: 0.5 mg/dL (ref 0.3–1.2)
BUN: 13 mg/dL (ref 6–20)
CO2: 22 mmol/L (ref 22–32)
Calcium: 8.4 mg/dL — ABNORMAL LOW (ref 8.9–10.3)
Chloride: 110 mmol/L (ref 101–111)
Creatinine, Ser: 0.54 mg/dL (ref 0.44–1.00)
GFR calc non Af Amer: >60 mL/min (ref >60)
GLUCOSE: 166 mg/dL — AB (ref 65–99)
POTASSIUM: 3.5 mmol/L (ref 3.5–5.1)
Sodium: 140 mmol/L (ref 135–145)
TOTAL PROTEIN: 6.2 g/dL — AB (ref 6.5–8.1)

## 2017-01-28 LAB — CBC
HEMATOCRIT: 37.5 % (ref 36.0–46.0)
Hemoglobin: 13 g/dL (ref 12.0–15.0)
MCH: 30.4 pg (ref 26.0–34.0)
MCHC: 34.7 g/dL (ref 30.0–36.0)
MCV: 87.8 fL (ref 78.0–100.0)
PLATELETS: 221 10*3/uL (ref 150–400)
RBC: 4.27 MIL/uL (ref 3.87–5.11)
RDW: 13.5 % (ref 11.5–15.5)
WBC: 6.1 10*3/uL (ref 4.0–10.5)

## 2017-01-28 MED ORDER — ALBUTEROL (5 MG/ML) CONTINUOUS INHALATION SOLN
10.0000 mg/h | INHALATION_SOLUTION | RESPIRATORY_TRACT | Status: DC
  Administered 2017-01-29: 10 mg/h via RESPIRATORY_TRACT
  Filled 2017-01-28: qty 20

## 2017-01-28 MED ORDER — FUROSEMIDE 10 MG/ML IJ SOLN
40.0000 mg | Freq: Once | INTRAMUSCULAR | Status: AC
  Administered 2017-01-28: 40 mg via INTRAVENOUS
  Filled 2017-01-28: qty 4

## 2017-01-28 MED ORDER — METHYLPREDNISOLONE SODIUM SUCC 125 MG IJ SOLR
60.0000 mg | Freq: Four times a day (QID) | INTRAMUSCULAR | Status: DC
  Administered 2017-01-28 – 2017-01-30 (×9): 60 mg via INTRAVENOUS
  Filled 2017-01-28 (×9): qty 2

## 2017-01-28 MED ORDER — IPRATROPIUM-ALBUTEROL 0.5-2.5 (3) MG/3ML IN SOLN
3.0000 mL | RESPIRATORY_TRACT | Status: DC
  Administered 2017-01-28 – 2017-01-30 (×15): 3 mL via RESPIRATORY_TRACT
  Filled 2017-01-28 (×14): qty 3

## 2017-01-28 NOTE — Plan of Care (Signed)
  Education: Knowledge of General Education information will improve 01/28/2017 0154 - Progressing by Herbert PunAddison, Keisean Skowron Y, RN   Health Behavior/Discharge Planning: Ability to manage health-related needs will improve 01/28/2017 0154 - Progressing by Dajohn Ellender, Dwana Melenahloe Y, RN

## 2017-01-28 NOTE — Progress Notes (Signed)
Patient ID: Brittany [name], female   DOB: 1955-10-29, 61 y.o.   MRN: [id number]    PROGRESS NOTE    Brittany Pepperserry Charbonnet  WGN:[id number] DOB: 1955-10-29 DOA: 01/27/2017  PCP: Orpah CobbKadakia, Ajay, MD   Brief Narrative:  Pt is 61 yo female with HTN, known smoker, presented with dyspnea, cough productive of yellow sputum, subjective fevers, wheezing.   Assessment & Plan:   Principal Problem:   Acute resp failure secondary to acute COPD exacerbation (HCC), bilateral PNA - pt reports feeling better but still with wheezing and course breath sounds  - will increase solumedrol to Q6 hours  - continue BD's scheduled and as needed - continue Levaquin day #2 - follow up on sputum cultures   Active Problems:   TOBACCO ABUSE - counseled on cessation     CAP (community acquired pneumonia), bilateral - Levaquin as noted above    Tachycardia - reactive - treat acute infection - monitor     Hypokalemia - supplemented and WNL - BMP in AM  DVT prophylaxis: Lovenox SQ Code Status: Full  Family Communication: Patient at bedside  Disposition Plan: to be determined   Consultants:   None  Procedures:   None  Antimicrobials:   Lovenox 11/10 -->  Subjective: Pt reports ongoing exertional dyspnea with wheezing but overall better.   Objective: Vitals:   01/28/17 0844 01/28/17 1147 01/28/17 1438 01/28/17 1443  BP:    (!) 143/76  Pulse:    100  Resp:    20  Temp:    97.8 F (36.6 C)  TempSrc:    Axillary  SpO2: 93% 95% 92% 95%  Weight:      Height:        Intake/Output Summary (Last 24 hours) at 01/28/2017 1714 Last data filed at 01/28/2017 1000 Gross per 24 hour  Intake 870 ml  Output 300 ml  Net 570 ml   Filed Weights   01/27/17 1324 01/27/17 1853  Weight: 51.3 kg (113 lb) 53.5 kg (117 lb 15.1 oz)    Examination:  General exam: Appears calm and comfortable  Respiratory system: Course breath sounds bilaterally with exp wheezing  Cardiovascular system: Tachycardic. No JVD,  murmurs, rubs, gallops or clicks. No pedal edema. Gastrointestinal system: Abdomen is nondistended, soft and nontender. No organomegaly or masses felt. Normal bowel sounds heard. Central nervous system: Alert and oriented. No focal neurological deficits.  Data Reviewed: I have personally reviewed following labs and imaging studies  CBC: Recent Labs  Lab 01/27/17 1401 01/28/17 0514  WBC 9.3 6.1  HGB 15.4* 13.0  HCT 43.4 37.5  MCV 88.2 87.8  PLT 219 221   Basic Metabolic Panel: Recent Labs  Lab 01/27/17 1401 01/28/17 0514  NA 133* 140  K 3.1* 3.5  CL 97* 110  CO2 23 22  GLUCOSE 178* 166*  BUN 16 13  CREATININE 0.78 0.54  CALCIUM 8.8* 8.4*   Liver Function Tests: Recent Labs  Lab 01/28/17 0514  AST 15  ALT 14  ALKPHOS 56  BILITOT 0.5  PROT 6.2*  ALBUMIN 3.1*   Urine analysis:    Component Value Date/Time   COLORURINE YELLOW 07/11/2013 1933   APPEARANCEUR CLEAR 07/11/2013 1933   LABSPEC 1.007 07/11/2013 1933   PHURINE 8.0 07/11/2013 1933   GLUCOSEU NEGATIVE 07/11/2013 1933   HGBUR NEGATIVE 07/11/2013 1933   BILIRUBINUR NEGATIVE 07/11/2013 1933   KETONESUR NEGATIVE 07/11/2013 1933   PROTEINUR NEGATIVE 07/11/2013 1933   UROBILINOGEN 0.2 07/11/2013 1933   NITRITE NEGATIVE 07/11/2013 1933  LEUKOCYTESUR NEGATIVE 07/11/2013 1933   Recent Results (from the past 240 hour(s))  Blood Culture (routine x 2)     Status: None (Preliminary result)   Collection Time: 01/27/17  3:04 PM  Result Value Ref Range Status   Specimen Description BLOOD LEFT ANTECUBITAL  Final   Special Requests   Final    BOTTLES DRAWN AEROBIC AND ANAEROBIC Blood Culture results may not be optimal due to an inadequate volume of blood received in culture bottles   Culture   Final    NO GROWTH < 24 HOURS Performed at [facility], [address] [place], [place] [address]    Report Status PENDING  Incomplete  Blood Culture (routine x 2)     Status: None (Preliminary result)    Collection Time: 01/27/17  7:19 PM  Result Value Ref Range Status   Specimen Description BLOOD LEFT HAND  Final   Special Requests   Final    BOTTLES DRAWN AEROBIC ONLY Blood Culture adequate volume   Culture   Final    NO GROWTH < 24 HOURS Performed at [facility], [address] [place], [place] [address]    Report Status PENDING  Incomplete    Radiology Studies: Dg Chest 2 View  Result Date: 01/27/2017 CLINICAL DATA:  Shortness of breath. EXAM: CHEST  2 VIEW COMPARISON:  Chest radiograph 11/28/2016. FINDINGS: Monitoring leads overlie the patient. Stable cardiac and mediastinal contours. Interval worsening diffuse bilateral coarse interstitial pulmonary opacities. No pleural effusion or pneumothorax. Thoracic spine degenerative changes. IMPRESSION: Interval development of diffuse bilateral coarse interstitial pulmonary opacities which may represent atypical infectious process or edema. Electronically Signed   By: Annia Beltrew  Davis M.D.   On: 01/27/2017 16:54   Scheduled Meds: . enoxaparin (LOVENOX) injection  40 mg Subcutaneous Q24H  . Influenza vac split quadrivalent PF  0.5 mL Intramuscular Tomorrow-1000  . ipratropium-albuterol  3 mL Nebulization Q4H  . losartan  50 mg Oral Daily  . methylPREDNISolone (SOLU-MEDROL) injection  60 mg Intravenous Q6H   Continuous Infusions: . levofloxacin (LEVAQUIN) IV 750 mg (01/28/17 1551)     LOS: 1 day    Time spent: 25 minutes    Debbora PrestoIskra Magick-Ad Guttman, MD [facility] Pager [phone]  If 7PM-7AM, please contact night-coverage [email or web address] Password [email or web address] 01/28/2017, 5:14 PM

## 2017-01-28 NOTE — Progress Notes (Signed)
Pt c/o SOB on exertion. Pt anxious and having trouble breathing. RT called to administered nebulizer. RT recommended lasix and chest xray. PA on call, Sofia, paged and to floor to assess pt. 40 mg Lasix IV ordered and administered. Portable chest xray ordered. Pt in no distress at this time. Will continue to monitor.

## 2017-01-28 NOTE — Plan of Care (Signed)
  Education: Knowledge of General Education information will improve 01/28/2017 0350 - Progressing by Herbert PunAddison, Alvis Pulcini Y, RN 01/28/2017 [phone] - Progressing by Herbert PunAddison, Roan Miklos Y, RN 01/28/2017 0154 - Progressing by Herbert PunAddison, Judie Hollick Y, RN   Health Behavior/Discharge Planning: Ability to manage health-related needs will improve 01/28/2017 0350 - Progressing by Herbert PunAddison, Hardie Veltre Y, RN 01/28/2017 0349 - Progressing by Herbert PunAddison, Carvel Huskins Y, RN 01/28/2017 0154 - Progressing by Herbert PunAddison, Zanetta Dehaan Y, RN

## 2017-01-28 NOTE — Plan of Care (Signed)
  Progressing Education: Knowledge of General Education information will improve 01/28/2017 2214 - Progressing by Cristela FeltSteffens, Aili Casillas P, RN Health Behavior/Discharge Planning: Ability to manage health-related needs will improve 01/28/2017 2214 - Progressing by Cristela FeltSteffens, Jazzmen Restivo P, RN Activity: Risk for activity intolerance will decrease 01/28/2017 2214 - Progressing by Cristela FeltSteffens, Trudie Cervantes P, RN Nutrition: Adequate nutrition will be maintained 01/28/2017 2214 - Progressing by Cristela FeltSteffens, Praise Dolecki P, RN Coping: Level of anxiety will decrease 01/28/2017 2214 - Progressing by Cristela FeltSteffens, Rocquel Askren P, RN Pain Managment: General experience of comfort will improve 01/28/2017 2214 - Progressing by Cristela FeltSteffens, Risha Barretta P, RN Safety: Ability to remain free from injury will improve 01/28/2017 2214 - Progressing by Cristela FeltSteffens, Leida Luton P, RN Education: Knowledge of disease or condition will improve 01/28/2017 2214 - Progressing by Cristela FeltSteffens, Alyjah Lovingood P, RN Knowledge of the prescribed therapeutic regimen will improve 01/28/2017 2214 - Progressing by Cristela FeltSteffens, Damali Broadfoot P, RN Respiratory: Levels of oxygenation will improve 01/28/2017 2214 - Progressing by Cristela FeltSteffens, Dezyrae Kensinger P, RN

## 2017-01-29 LAB — CBC
HCT: 41.1 % (ref 36.0–46.0)
Hemoglobin: 14.4 g/dL (ref 12.0–15.0)
MCH: 30.8 pg (ref 26.0–34.0)
MCHC: 35 g/dL (ref 30.0–36.0)
MCV: 88 fL (ref 78.0–100.0)
Platelets: 299 K/uL (ref 150–400)
RBC: 4.67 MIL/uL (ref 3.87–5.11)
RDW: 13.7 % (ref 11.5–15.5)
WBC: 16.5 K/uL — ABNORMAL HIGH (ref 4.0–10.5)

## 2017-01-29 LAB — STREP PNEUMONIAE URINARY ANTIGEN: STREP PNEUMO URINARY ANTIGEN: NEGATIVE

## 2017-01-29 LAB — MAGNESIUM: Magnesium: 1.9 mg/dL (ref 1.7–2.4)

## 2017-01-29 MED ORDER — LORAZEPAM 2 MG/ML IJ SOLN
0.5000 mg | Freq: Once | INTRAMUSCULAR | Status: AC
Start: 2017-01-29 — End: 2017-01-29
  Administered 2017-01-29: 0.5 mg via INTRAVENOUS
  Filled 2017-01-29: qty 1

## 2017-01-29 MED ORDER — LORAZEPAM 2 MG/ML IJ SOLN
0.5000 mg | INTRAMUSCULAR | Status: DC | PRN
  Administered 2017-01-29 – 2017-02-02 (×7): 0.5 mg via INTRAVENOUS
  Filled 2017-01-29 (×8): qty 1

## 2017-01-29 MED ORDER — POTASSIUM CHLORIDE CRYS ER 20 MEQ PO TBCR
40.0000 meq | EXTENDED_RELEASE_TABLET | Freq: Two times a day (BID) | ORAL | Status: AC
  Administered 2017-01-29 (×2): 40 meq via ORAL
  Filled 2017-01-29 (×2): qty 2

## 2017-01-29 MED ORDER — LORAZEPAM 2 MG/ML IJ SOLN
0.5000 mg | Freq: Once | INTRAMUSCULAR | Status: AC
  Administered 2017-01-29: 0.5 mg via INTRAVENOUS
  Filled 2017-01-29: qty 1

## 2017-01-29 NOTE — Progress Notes (Signed)
Patient ID: Brittany [name], female   DOB: 1955/08/30, 61 y.o.   MRN: [id number]    PROGRESS NOTE   Brittany [name]  WGN:[id number] DOB: 1955/08/30 DOA: 01/27/2017  PCP: Orpah CobbKadakia, Ajay, MD   Brief Narrative:  Pt is 61 yo female with HTN, known smoker, presented with dyspnea, cough productive of yellow sputum, subjective fevers, wheezing.   Assessment & Plan:   Principal Problem:   Acute resp failure secondary to acute COPD exacerbation (HCC), bilateral PNA - pt still with dyspnea exertional mostly but less wheezing  - she is currently on solumedrol 60 mg IV Q6 hours and will continue same regimen - continue BD's scheduled and as needed - continue Levaquin day #3 - follow up on sputum cultures - may need CT chest for clearer evaluation   Active Problems:   TOBACCO ABUSE - counseled on cessation     CAP (community acquired pneumonia), bilateral - cont Levaquin as noted above     Tachycardia - reactive, continue to treat infection     Hypokalemia - supplement and repeat BMP in AM  DVT prophylaxis: Lovenox SQ Code Status: Full  Family Communication: Patient at bedside  Disposition Plan: to be determined   Consultants:   None  Procedures:   None  Antimicrobials:   Levaquin 11/10 -->  Subjective: Pt still with exertional dyspnea.   Objective: Vitals:   01/29/17 0420 01/29/17 0619 01/29/17 0724 01/29/17 1101  BP:  137/82    Pulse:  (!) 111    Resp: (!) 22 (!) 24    Temp:  97.7 F (36.5 C)    TempSrc: Oral Oral    SpO2:  93% 92% 93%  Weight:      Height:        Intake/Output Summary (Last 24 hours) at 01/29/2017 1159 Last data filed at 01/29/2017 0900 Gross per 24 hour  Intake 390 ml  Output 1600 ml  Net -1210 ml   Filed Weights   01/27/17 1324 01/27/17 1853  Weight: 51.3 kg (113 lb) 53.5 kg (117 lb 15.1 oz)    Physical Exam  Constitutional: Appears stable, NAD but slightly increased work of breathing  CVS: RRR, S1/S2 +, no murmurs, no gallops, no  carotid bruit.  Pulmonary: bilateral rhonchi with exp wheezing, diminished air movement at bases Abdominal: Soft. BS +,  no distension, tenderness, rebound or guarding.  Musculoskeletal: Normal range of motion. No edema and no tenderness.  Lymphadenopathy: No lymphadenopathy noted, cervical, inguinal. Neuro: Alert. Normal reflexes, muscle tone coordination. No cranial nerve deficit.  Data Reviewed: I have personally reviewed following labs and imaging studies  CBC: Recent Labs  Lab 01/27/17 1401 01/28/17 0514 01/29/17 0515  WBC 9.3 6.1 16.5*  HGB 15.4* 13.0 14.4  HCT 43.4 37.5 41.1  MCV 88.2 87.8 88.0  PLT 219 221 299   Basic Metabolic Panel: Recent Labs  Lab 01/27/17 1401 01/28/17 0514 01/29/17 0515  NA 133* 140 143  K 3.1* 3.5 2.7*  CL 97* 110 106  CO2 23 22 22   GLUCOSE 178* 166* 183*  BUN 16 13 24*  CREATININE 0.78 0.54 0.75  CALCIUM 8.8* 8.4* 9.1   Liver Function Tests: Recent Labs  Lab 01/28/17 0514  AST 15  ALT 14  ALKPHOS 56  BILITOT 0.5  PROT 6.2*  ALBUMIN 3.1*   Urine analysis:    Component Value Date/Time   COLORURINE YELLOW 07/11/2013 1933   APPEARANCEUR CLEAR 07/11/2013 1933   LABSPEC 1.007 07/11/2013 1933   PHURINE 8.0 07/11/2013  1933   GLUCOSEU NEGATIVE 07/11/2013 1933   HGBUR NEGATIVE 07/11/2013 1933   BILIRUBINUR NEGATIVE 07/11/2013 1933   KETONESUR NEGATIVE 07/11/2013 1933   PROTEINUR NEGATIVE 07/11/2013 1933   UROBILINOGEN 0.2 07/11/2013 1933   NITRITE NEGATIVE 07/11/2013 1933   LEUKOCYTESUR NEGATIVE 07/11/2013 1933   Recent Results (from the past 240 hour(s))  Blood Culture (routine x 2)     Status: None (Preliminary result)   Collection Time: 01/27/17  3:04 PM  Result Value Ref Range Status   Specimen Description BLOOD LEFT ANTECUBITAL  Final   Special Requests   Final    BOTTLES DRAWN AEROBIC AND ANAEROBIC Blood Culture results may not be optimal due to an inadequate volume of blood received in culture bottles   Culture    Final    NO GROWTH 2 DAYS Performed at [facility], [address] [place], [place] [address]    Report Status PENDING  Incomplete  Blood Culture (routine x 2)     Status: None (Preliminary result)   Collection Time: 01/27/17  7:19 PM  Result Value Ref Range Status   Specimen Description BLOOD LEFT HAND  Final   Special Requests   Final    BOTTLES DRAWN AEROBIC ONLY Blood Culture adequate volume   Culture   Final    NO GROWTH 2 DAYS Performed at [facility], [address] [place], [place] [address]    Report Status PENDING  Incomplete    Radiology Studies: Dg Chest 2 View  Result Date: 01/27/2017 CLINICAL DATA:  Shortness of breath. EXAM: CHEST  2 VIEW COMPARISON:  Chest radiograph 11/28/2016. FINDINGS: Monitoring leads overlie the patient. Stable cardiac and mediastinal contours. Interval worsening diffuse bilateral coarse interstitial pulmonary opacities. No pleural effusion or pneumothorax. Thoracic spine degenerative changes. IMPRESSION: Interval development of diffuse bilateral coarse interstitial pulmonary opacities which may represent atypical infectious process or edema. Electronically Signed   By: Annia Beltrew  Davis M.D.   On: 01/27/2017 16:54   Dg Chest Port 1 View  Result Date: 01/29/2017 CLINICAL DATA:  Acute onset of shortness of breath and cough. EXAM: PORTABLE CHEST 1 VIEW COMPARISON:  Chest radiograph performed 01/27/2017 FINDINGS: The lungs are well-aerated. Mild right midlung and biapical opacities may reflect pneumonia, given the patient's symptoms. There is no evidence of pleural effusion or pneumothorax. The cardiomediastinal silhouette is within normal limits. No acute osseous abnormalities are seen. IMPRESSION: Mild right midlung and biapical opacities may reflect pneumonia, given the patient's symptoms. This has redistributed since the recent prior study. Electronically Signed   By: Roanna RaiderJeffery  Chang M.D.   On: 01/29/2017 00:11   Scheduled Meds: . enoxaparin  (LOVENOX) injection  40 mg Subcutaneous Q24H  . Influenza vac split quadrivalent PF  0.5 mL Intramuscular Tomorrow-1000  . ipratropium-albuterol  3 mL Nebulization Q4H  . losartan  50 mg Oral Daily  . methylPREDNISolone (SOLU-MEDROL) injection  60 mg Intravenous Q6H  . potassium chloride  40 mEq Oral BID   Continuous Infusions: . albuterol Stopped (01/29/17 0050)  . levofloxacin (LEVAQUIN) IV Stopped (01/28/17 1755)     LOS: 2 days   Time spent: 25 minutes   Debbora PrestoIskra Magick-Dajuan Turnley, MD [facility] Pager [phone]  If 7PM-7AM, please contact night-coverage [email or web address] Password [email or web address] 01/29/2017, 11:59 AM

## 2017-01-29 NOTE — Plan of Care (Signed)
  Progressing Education: Knowledge of General Education information will improve 01/29/2017 2206 - Progressing by Cristela FeltSteffens, Julieana Eshleman P, RN Health Behavior/Discharge Planning: Ability to manage health-related needs will improve 01/29/2017 2206 - Progressing by Cristela FeltSteffens, Maylon Sailors P, RN Clinical Measurements: Respiratory complications will improve 01/29/2017 2206 - Progressing by Cristela FeltSteffens, Artha Chiasson P, RN Activity: Risk for activity intolerance will decrease 01/29/2017 2206 - Progressing by Cristela FeltSteffens, Oletta Buehring P, RN Nutrition: Adequate nutrition will be maintained 01/29/2017 2206 - Progressing by Cristela FeltSteffens, Alexxander Kurt P, RN Coping: Level of anxiety will decrease 01/29/2017 2206 - Progressing by Cristela FeltSteffens, Iridiana Fonner P, RN Pain Managment: General experience of comfort will improve 01/29/2017 2206 - Progressing by Cristela FeltSteffens, Baylynn Shifflett P, RN Safety: Ability to remain free from injury will improve 01/29/2017 2206 - Progressing by Cristela FeltSteffens, Kerra Guilfoil P, RN Education: Knowledge of disease or condition will improve 01/29/2017 2206 - Progressing by Cristela FeltSteffens, Danniel Tones P, RN Knowledge of the prescribed therapeutic regimen will improve 01/29/2017 2206 - Progressing by Cristela FeltSteffens, Hiliary Osorto P, RN

## 2017-01-30 DIAGNOSIS — J189 Pneumonia, unspecified organism: Secondary | ICD-10-CM

## 2017-01-30 DIAGNOSIS — R0902 Hypoxemia: Secondary | ICD-10-CM

## 2017-01-30 DIAGNOSIS — J441 Chronic obstructive pulmonary disease with (acute) exacerbation: Principal | ICD-10-CM

## 2017-01-30 DIAGNOSIS — F17200 Nicotine dependence, unspecified, uncomplicated: Secondary | ICD-10-CM

## 2017-01-30 LAB — BASIC METABOLIC PANEL
Anion gap: 15 (ref 5–15)
Anion gap: 10 (ref 5–15)
BUN: 24 mg/dL — ABNORMAL HIGH (ref 6–20)
BUN: 45 mg/dL — ABNORMAL HIGH (ref 6–20)
CALCIUM: 9.1 mg/dL (ref 8.9–10.3)
CALCIUM: 9.3 mg/dL (ref 8.9–10.3)
CHLORIDE: 110 mmol/L (ref 101–111)
CO2: 22 mmol/L (ref 22–32)
CO2: 22 mmol/L (ref 22–32)
CREATININE: 0.78 mg/dL (ref 0.44–1.00)
Chloride: 106 mmol/L (ref 101–111)
Creatinine, Ser: 0.75 mg/dL (ref 0.44–1.00)
Glucose, Bld: 183 mg/dL — ABNORMAL HIGH (ref 65–99)
Glucose, Bld: 191 mg/dL — ABNORMAL HIGH (ref 65–99)
Potassium: 2.7 mmol/L — CL (ref 3.5–5.1)
Potassium: 5.3 mmol/L — ABNORMAL HIGH (ref 3.5–5.1)
SODIUM: 142 mmol/L (ref 135–145)
Sodium: 143 mmol/L (ref 135–145)

## 2017-01-30 LAB — CBC
HCT: 44.9 % (ref 36.0–46.0)
HEMOGLOBIN: 15.5 g/dL — AB (ref 12.0–15.0)
MCH: 30.6 pg (ref 26.0–34.0)
MCHC: 34.5 g/dL (ref 30.0–36.0)
MCV: 88.7 fL (ref 78.0–100.0)
PLATELETS: 342 10*3/uL (ref 150–400)
RBC: 5.06 MIL/uL (ref 3.87–5.11)
RDW: 14 % (ref 11.5–15.5)
WBC: 18.3 10*3/uL — ABNORMAL HIGH (ref 4.0–10.5)

## 2017-01-30 LAB — LEGIONELLA PNEUMOPHILA SEROGP 1 UR AG: L. PNEUMOPHILA SEROGP 1 UR AG: NEGATIVE

## 2017-01-30 LAB — MAGNESIUM: MAGNESIUM: 2.3 mg/dL (ref 1.7–2.4)

## 2017-01-30 MED ORDER — MOMETASONE FURO-FORMOTEROL FUM 200-5 MCG/ACT IN AERO
2.0000 | INHALATION_SPRAY | Freq: Two times a day (BID) | RESPIRATORY_TRACT | Status: DC
Start: 2017-01-30 — End: 2017-02-02
  Administered 2017-01-30 – 2017-02-02 (×6): 2 via RESPIRATORY_TRACT
  Filled 2017-01-30: qty 8.8

## 2017-01-30 MED ORDER — IPRATROPIUM-ALBUTEROL 0.5-2.5 (3) MG/3ML IN SOLN
3.0000 mL | Freq: Four times a day (QID) | RESPIRATORY_TRACT | Status: DC
  Administered 2017-01-31 – 2017-02-01 (×8): 3 mL via RESPIRATORY_TRACT
  Filled 2017-01-30: qty 3
  Filled 2017-01-30: qty 39
  Filled 2017-01-30 (×6): qty 3

## 2017-01-30 MED ORDER — FUROSEMIDE 10 MG/ML IJ SOLN
40.0000 mg | Freq: Once | INTRAMUSCULAR | Status: AC
  Administered 2017-01-30: 40 mg via INTRAVENOUS
  Filled 2017-01-30: qty 4

## 2017-01-30 MED ORDER — METHYLPREDNISOLONE SODIUM SUCC 125 MG IJ SOLR
60.0000 mg | Freq: Two times a day (BID) | INTRAMUSCULAR | Status: DC
  Administered 2017-01-31 – 2017-02-02 (×6): 60 mg via INTRAVENOUS
  Filled 2017-01-30 (×7): qty 2

## 2017-01-30 NOTE — Progress Notes (Signed)
PT demonstrated hands on understanding of Flutter device- productive cough at this time (white, thick).

## 2017-01-30 NOTE — Plan of Care (Signed)
  Progressing Education: Knowledge of General Education information will improve 01/30/2017 2205 - Progressing by Cristela FeltSteffens, Jax Abdelrahman P, RN Health Behavior/Discharge Planning: Ability to manage health-related needs will improve 01/30/2017 2205 - Progressing by Cristela FeltSteffens, Vonetta Foulk P, RN Clinical Measurements: Ability to maintain clinical measurements within normal limits will improve 01/30/2017 2205 - Progressing by Cristela FeltSteffens, Finnian Husted P, RN Activity: Risk for activity intolerance will decrease 01/30/2017 2205 - Progressing by Cristela FeltSteffens, Carollyn Etcheverry P, RN Nutrition: Adequate nutrition will be maintained 01/30/2017 2205 - Progressing by Cristela FeltSteffens, Lyfe Reihl P, RN Coping: Level of anxiety will decrease 01/30/2017 2205 - Progressing by Cristela FeltSteffens, Daritza Brees P, RN Elimination: Will not experience complications related to urinary retention 01/30/2017 2205 - Progressing by Cristela FeltSteffens, Emannuel Vise P, RN Pain Managment: General experience of comfort will improve 01/30/2017 2205 - Progressing by Cristela FeltSteffens, Aime Meloche P, RN Safety: Ability to remain free from injury will improve 01/30/2017 2205 - Progressing by Cristela FeltSteffens, Xiao Graul P, RN Skin Integrity: Risk for impaired skin integrity will decrease 01/30/2017 2205 - Progressing by Cristela FeltSteffens, Ammara Raj P, RN Activity: Will verbalize the importance of balancing activity with adequate rest periods 01/30/2017 2205 - Progressing by Cristela FeltSteffens, Khasir Woodrome P, RN Education: Knowledge of disease or condition will improve 01/30/2017 2205 - Progressing by Cristela FeltSteffens, Aadi Bordner P, RN Knowledge of the prescribed therapeutic regimen will improve 01/30/2017 2205 - Progressing by Cristela FeltSteffens, Lessie Manigo P, RN

## 2017-01-30 NOTE — Progress Notes (Addendum)
Patient ID: Brittany [name], female   DOB: 09/27/1955, 61 y.o.   MRN: [id number]    PROGRESS NOTE  Brittany [name]  HYQ:[id number] DOB: 09/27/1955 DOA: 01/27/2017  PCP: Orpah CobbKadakia, Ajay, MD   Brief Narrative:  Pt is 61 yo female with HTN, known smoker, presented with dyspnea, cough productive of yellow sputum, subjective fevers, wheezing.   Assessment & Plan:   Principal Problem:   Acute resp failure secondary to acute COPD exacerbation (HCC), bilateral PNA - pt still with dyspnea and slightly increased work of breathing  - she is currently on solumedrol 60 mg IV Q6 hours, wheezing has improved overall  - I have consulted with PCCM  - Continue bronchodilators scheduled and as needed - Continue Levaquin day #4  Active Problems:   TOBACCO ABUSE - counseled on cessation  - Declined need for nicotine patch - Patient says that last time she smoked was about week prior to this admission    CAP (community acquired pneumonia), bilateral - Has been on Levaquin as noted above    Chronic diastolic CHF - Recent echocardiogram with EF 55%, grade 1 diastolic CHF - Patient has not been on Lasix at home, has been given dose of Lasix IV here inpatient - Weight on admission Filed Weights   01/27/17 1324 01/27/17 1853  Weight: 51.3 kg (113 lb) 53.5 kg (117 lb 15.1 oz)  - we'll monitor daily weights, strict intake and output    Tachycardia - reactive, continue to treat infection     Hypokalemia - Has been supplemented but now oh higher end - Stop supplementing, repeat BMP in the morning  DVT prophylaxis: Lovenox SQ Code Status: Full  Family Communication: pt at bedside  Disposition Plan: to be determined   Consultants:   None  Procedures:   None  Antimicrobials:   Levaquin 11/10 -->  Subjective: Pt still with dyspnea and and cough. Says she feels somewhat better.   Objective: Vitals:   01/30/17 0457 01/30/17 0846 01/30/17 0908 01/30/17 0911  BP: (!) 148/97  (!) 172/110 (!)  139/98  Pulse: (!) 120  (!) 130 (!) 121  Resp: (!) 24  (!) 32   Temp: 98.3 F (36.8 C)     TempSrc: Oral     SpO2: 94% 95% 94% 95%  Weight:      Height:        Intake/Output Summary (Last 24 hours) at 01/30/2017 1120 Last data filed at 01/30/2017 0600 Gross per 24 hour  Intake 270 ml  Output 500 ml  Net -230 ml   Filed Weights   01/27/17 1324 01/27/17 1853  Weight: 51.3 kg (113 lb) 53.5 kg (117 lb 15.1 oz)    Physical Exam  Constitutional: Appears stable but slightly increased work of breathing  CVS: tachycardic, S1/S2 +, no gallops, no carotid bruit.  Pulmonary: some rhonchi at bases with mild exp wheezing, mild tachypnea but no accessory muscles used  Abdominal: Soft. BS +,  no distension, tenderness, rebound or guarding.  Musculoskeletal: Normal range of motion. No edema and no tenderness.  Neuro: Alert. Normal reflexes, muscle tone coordination. No cranial nerve deficit.  Data Reviewed: I have personally reviewed following labs and imaging studies  CBC: Recent Labs  Lab 01/27/17 1401 01/28/17 0514 01/29/17 0515 01/30/17 0450  WBC 9.3 6.1 16.5* 18.3*  HGB 15.4* 13.0 14.4 15.5*  HCT 43.4 37.5 41.1 44.9  MCV 88.2 87.8 88.0 88.7  PLT 219 221 299 342   Basic Metabolic Panel: Recent Labs  Lab  01/27/17 1401 01/28/17 0514 01/29/17 0515 01/30/17 0450  NA 133* 140 143 142  K 3.1* 3.5 2.7* 5.3*  CL 97* 110 106 110  CO2 23 22 22 22   GLUCOSE 178* 166* 183* 191*  BUN 16 13 24* 45*  CREATININE 0.78 0.54 0.75 0.78  CALCIUM 8.8* 8.4* 9.1 9.3  MG  --   --  1.9 2.3   Liver Function Tests: Recent Labs  Lab 01/28/17 0514  AST 15  ALT 14  ALKPHOS 56  BILITOT 0.5  PROT 6.2*  ALBUMIN 3.1*   Urine analysis:    Component Value Date/Time   COLORURINE YELLOW 07/11/2013 1933   APPEARANCEUR CLEAR 07/11/2013 1933   LABSPEC 1.007 07/11/2013 1933   PHURINE 8.0 07/11/2013 1933   GLUCOSEU NEGATIVE 07/11/2013 1933   HGBUR NEGATIVE 07/11/2013 1933   BILIRUBINUR  NEGATIVE 07/11/2013 1933   KETONESUR NEGATIVE 07/11/2013 1933   PROTEINUR NEGATIVE 07/11/2013 1933   UROBILINOGEN 0.2 07/11/2013 1933   NITRITE NEGATIVE 07/11/2013 1933   LEUKOCYTESUR NEGATIVE 07/11/2013 1933   Recent Results (from the past 240 hour(s))  Blood Culture (routine x 2)     Status: None (Preliminary result)   Collection Time: 01/27/17  3:04 PM  Result Value Ref Range Status   Specimen Description BLOOD LEFT ANTECUBITAL  Final   Special Requests   Final    BOTTLES DRAWN AEROBIC AND ANAEROBIC Blood Culture results may not be optimal due to an inadequate volume of blood received in culture bottles   Culture   Final    NO GROWTH 2 DAYS Performed at [facility], [address] [place], [place] [address]    Report Status PENDING  Incomplete  Blood Culture (routine x 2)     Status: None (Preliminary result)   Collection Time: 01/27/17  7:19 PM  Result Value Ref Range Status   Specimen Description BLOOD LEFT HAND  Final   Special Requests   Final    BOTTLES DRAWN AEROBIC ONLY Blood Culture adequate volume   Culture   Final    NO GROWTH 2 DAYS Performed at [facility], [address] [place], [place] [address]    Report Status PENDING  Incomplete    Radiology Studies: Dg Chest Port 1 View  Result Date: 01/29/2017 CLINICAL DATA:  Acute onset of shortness of breath and cough. EXAM: PORTABLE CHEST 1 VIEW COMPARISON:  Chest radiograph performed 01/27/2017 FINDINGS: The lungs are well-aerated. Mild right midlung and biapical opacities may reflect pneumonia, given the patient's symptoms. There is no evidence of pleural effusion or pneumothorax. The cardiomediastinal silhouette is within normal limits. No acute osseous abnormalities are seen. IMPRESSION: Mild right midlung and biapical opacities may reflect pneumonia, given the patient's symptoms. This has redistributed since the recent prior study. Electronically Signed   By: Roanna RaiderJeffery  Chang M.D.   On: 01/29/2017 00:11     Scheduled Meds: . enoxaparin (LOVENOX) injection  40 mg Subcutaneous Q24H  . Influenza vac split quadrivalent PF  0.5 mL Intramuscular Tomorrow-1000  . ipratropium-albuterol  3 mL Nebulization Q4H  . losartan  50 mg Oral Daily  . methylPREDNISolone (SOLU-MEDROL) injection  60 mg Intravenous Q6H   Continuous Infusions: . albuterol Stopped (01/29/17 0050)  . levofloxacin (LEVAQUIN) IV Stopped (01/29/17 1606)     LOS: 3 days   Time spent: 25 minutes   Debbora PrestoIskra Magick-Nitin Mckowen, MD [facility] Pager [phone]  If 7PM-7AM, please contact night-coverage [email or web address] Password [email or web address] 01/30/2017, 11:20 AM

## 2017-01-30 NOTE — Consult Note (Signed)
Name: Brittany [name] MRN: [id number] DOB: 10-03-1955    ADMISSION DATE:  01/27/2017 CONSULTATION DATE:  01/30/2017  REFERRING MD :  Burnell BlanksMyers, TRH  CHIEF COMPLAINT:  Pneumonia not improving and new O2 demand  BRIEF PATIENT DESCRIPTION: 61 year old female with 35 pack year history of smoking who actively smoked till admission whose daughter (who lives with her) was recently discharged [facility] with the diagnosis of pneumonia.  Patient, daughter and roommate all smoke in the house.  No recent change in her living environment.  Usually produces a lot of phlegm but since she has been sick have produced no phlegm.  No fever at home or in the hospital.  Patient had a CXR that I reviewed myself that shows subtle infiltrate.  Patient has no previous diagnosis of COPD (her cardiologist diagnosed her but unsure how).  On no O2 or inhalers at home.  Reports to have never checked her O2 sat prior.  PCCM asked to evaluate for hypoxemia and the fact that she is not improving.  SIGNIFICANT EVENTS  11/9 admission 11/12 PCCM consult  STUDIES:  CXR with subtle infiltrate.  Subjective: Feels better since admission  HISTORY OF PRESENT ILLNESS:  61 year old female with 35 pack year history of smoking who actively smoked till admission whose daughter (who lives with her) was recently discharged from [facility] with the diagnosis of pneumonia.  Patient, daughter and roommate all smoke in the house.  No recent change in her living environment.  Usually produces a lot of phlegm but since she has been sick have produced no phlegm.  No fever at home or in the hospital.  Patient had a CXR that I reviewed myself that shows subtle infiltrate.  Patient has no previous diagnosis of COPD (her cardiologist diagnosed her but unsure how).  On no O2 or inhalers at home.  Reports to have never checked her O2 sat prior.  PCCM asked to evaluate for hypoxemia and the fact that she is not improving.  PAST MEDICAL HISTORY :   has a past  medical history of Alzheimer disease, Hernia, abdominal, Hypertension, and Normal cardiac stress test (2007).  has a past surgical history that includes Cesarean section; curretage for endometriosis (93, 94,95, 96); Cholecystectomy; GYN surgery; Abdominal hysterectomy; Ovarian cyst removal; and Hernia repair.   Prior to Admission medications   Medication Sig Start Date End Date Taking? Authorizing Provider  nitroGLYCERIN (NITROSTAT) 0.3 MG SL tablet Place 0.3 mg under the tongue every 5 (five) minutes as needed for chest pain (chest pain).    Yes [provider]  albuterol (PROVENTIL HFA;VENTOLIN HFA) 108 (90 BASE) MCG/ACT inhaler Inhale 2 puffs into the lungs every 6 (six) hours as needed for wheezing or shortness of breath. Patient not taking: Reported on 01/27/2017 04/29/14   Mellody DrownParker, Lauren, PA-C  ibuprofen (ADVIL,MOTRIN) 600 MG tablet Take 1 tablet (600 mg total) by mouth every 6 (six) hours as needed. Patient not taking: Reported on 01/27/2017 06/08/16   Ward, Chase PicketJaime Pilcher, PA-C  losartan (COZAAR) 50 MG tablet Take 50 mg by mouth daily.    [provider]   Allergies: Pen = rash and sulfa = N/V  FAMILY HISTORY:  family history includes COPD in her mother; Cancer in her mother; Dementia in her mother; Diabetes in her father; Heart attack in her father; Stroke in her mother.  SOCIAL HISTORY:  reports that she has been smoking cigarettes.  She has a 15.00 pack-year smoking history. She has quit using smokeless  tobacco. She reports that she drinks alcohol. She reports that she uses drugs. Drug: Marijuana.  REVIEW OF SYSTEMS:   Constitutional: Negative for fever, chills, weight loss, malaise/fatigue and diaphoresis.  HENT: Negative for hearing loss, ear pain, nosebleeds, congestion, sore throat, neck pain, tinnitus and ear discharge.   Eyes: Negative for blurred vision, double vision, photophobia, pain, discharge and redness.  Respiratory: Negative for cough, hemoptysis, sputum  production, shortness of breath, wheezing and stridor.   Cardiovascular: Negative for chest pain, palpitations, orthopnea, claudication, leg swelling and PND.  Gastrointestinal: Negative for heartburn, nausea, vomiting, abdominal pain, diarrhea, constipation, blood in stool and melena.  Genitourinary: Negative for dysuria, urgency, frequency, hematuria and flank pain.  Musculoskeletal: Negative for myalgias, back pain, joint pain and falls.  Skin: Negative for itching and rash.  Neurological: Negative for dizziness, tingling, tremors, sensory change, speech change, focal weakness, seizures, loss of consciousness, weakness and headaches.  Endo/Heme/Allergies: Negative for environmental allergies and polydipsia. Does not bruise/bleed easily.  SUBJECTIVE:   VITAL SIGNS: Temp:  [98.3 F (36.8 C)-98.6 F (37 C)] 98.3 F (36.8 C) (11/12 0457) Pulse Rate:  [65-144] 138 (11/12 1300) Resp:  [18-32] 32 (11/12 0908) BP: (128-172)/(84-110) 139/98 (11/12 0911) SpO2:  [90 %-95 %] 95 % (11/12 0911)  PHYSICAL EXAMINATION: General:  Chronically ill appearing female, NAD on O2 Neuro:  Alert and interactive, moving all ext to command HEENT:  [place]/AT, PERRL, EOM-I and MMM Cardiovascular:  RRR, Nl S1/S2, -M/R/G. Lungs:  Very quite chest, distant BS, no wheezes Abdomen:  Soft, NT, ND and +BS Musculoskeletal:  -edema and -tenderness Skin:  Intact  Recent Labs  Lab 01/28/17 0514 01/29/17 0515 01/30/17 0450  NA 140 143 142  K 3.5 2.7* 5.3*  CL 110 106 110  CO2 22 22 22   BUN 13 24* 45*  CREATININE 0.54 0.75 0.78  GLUCOSE 166* 183* 191*   Recent Labs  Lab 01/28/17 0514 01/29/17 0515 01/30/17 0450  HGB 13.0 14.4 15.5*  HCT 37.5 41.1 44.9  WBC 6.1 16.5* 18.3*  PLT 221 299 342   Dg Chest Port 1 View  Result Date: 01/29/2017 CLINICAL DATA:  Acute onset of shortness of breath and cough. EXAM: PORTABLE CHEST 1 VIEW COMPARISON:  Chest radiograph performed 01/27/2017 FINDINGS: The lungs are  well-aerated. Mild right midlung and biapical opacities may reflect pneumonia, given the patient's symptoms. There is no evidence of pleural effusion or pneumothorax. The cardiomediastinal silhouette is within normal limits. No acute osseous abnormalities are seen. IMPRESSION: Mild right midlung and biapical opacities may reflect pneumonia, given the patient's symptoms. This has redistributed since the recent prior study. Electronically Signed   By: Roanna RaiderJeffery  Chang M.D.   On: 01/29/2017 00:11    ASSESSMENT / PLAN:  61 year old female with the presumptive diagnosis of COPD who presents to PCCM with new O2 demand and not improving pneumonia.  Patient did have health care exposure from daughter as she spent a lot of time in the hospital with daughter and the daughter lives with her.  Can make the argument that she should have been treated as HCAP.  However, she reports feeling better with levofloxacin so will not alter at this point.  Discussed with TRH-MD and PCCM-MD.  Pneumonia:  - Continue levofloxacin for now, for course of 8 days.  - F/U on cultures, NTD.  COPD:  - Add dulera  - Decrease solumedrol to 60 mg IV q12 x3 more days then switch to PO prednisone with taper  -  Will need PFTs as outpatient  - Will need to f/u with pulmonary when closer to discharge.  Hypoxemia: wonder if patient has been hypoxemic at home and we are just catching this.  Patient is ok with going home on home O2.  - Titrate O2 for sat of 88-92%  - Will need an ambulatory desaturation study prior to discharge for home O2.  Tobacco abuse:  - Smoking cessation  - Discussed with patient, she is quitting and daughter and roommate will no longer be smoking in the house.  PCCM will follow.  Alyson Reedy, M.D. [facility] Pulmonary/Critical Care Medicine. Pager: [phone]. After hours pager: [phone].  01/30/2017, 1:11 PM

## 2017-01-30 NOTE — Plan of Care (Signed)
  Education: Knowledge of General Education information will improve 01/30/2017 1436 - Progressing by William Daltonarpenter, Gregori Abril L, RN   Pain Managment: General experience of comfort will improve 01/30/2017 1436 - Progressing by William Daltonarpenter, Saudia Smyser L, RN   Skin Integrity: Risk for impaired skin integrity will decrease 01/30/2017 1436 - Progressing by William Daltonarpenter, Demian Maisel L, RN

## 2017-01-31 LAB — CBC
HEMATOCRIT: 45.4 % (ref 36.0–46.0)
HEMOGLOBIN: 15.6 g/dL — AB (ref 12.0–15.0)
MCH: 30.5 pg (ref 26.0–34.0)
MCHC: 34.4 g/dL (ref 30.0–36.0)
MCV: 88.8 fL (ref 78.0–100.0)
Platelets: 322 10*3/uL (ref 150–400)
RBC: 5.11 MIL/uL (ref 3.87–5.11)
RDW: 13.9 % (ref 11.5–15.5)
WBC: 17.2 10*3/uL — AB (ref 4.0–10.5)

## 2017-01-31 LAB — BASIC METABOLIC PANEL
Anion gap: 11 (ref 5–15)
BUN: 50 mg/dL — ABNORMAL HIGH (ref 6–20)
CHLORIDE: 104 mmol/L (ref 101–111)
CO2: 24 mmol/L (ref 22–32)
CREATININE: 0.85 mg/dL (ref 0.44–1.00)
Calcium: 9 mg/dL (ref 8.9–10.3)
GFR calc non Af Amer: >60 mL/min (ref >60)
Glucose, Bld: 190 mg/dL — ABNORMAL HIGH (ref 65–99)
Potassium: 4.9 mmol/L (ref 3.5–5.1)
Sodium: 139 mmol/L (ref 135–145)

## 2017-01-31 LAB — EXPECTORATED SPUTUM ASSESSMENT W GRAM STAIN, RFLX TO RESP C

## 2017-01-31 LAB — EXPECTORATED SPUTUM ASSESSMENT W REFEX TO RESP CULTURE

## 2017-01-31 NOTE — Plan of Care (Signed)
  Progressing Health Behavior/Discharge Planning: Ability to manage health-related needs will improve 01/31/2017 0929 - Progressing by Brittany [name], Brittany Klimaszewski E, RN Clinical Measurements: Ability to maintain clinical measurements within normal limits will improve 01/31/2017 0929 - Progressing by Brittany [name], Brittany Twaddle E, RN Note STill w/ significant SOB and DOE.  Will remain free from infection 01/31/2017 0929 - Progressing by Brittany [name], Brittany Ashworth E, RN Note IV abx for pna.  Diagnostic test results will improve 01/31/2017 0929 - Progressing by Brittany [name], Brittany Shambaugh E, RN Respiratory complications will improve 01/31/2017 0929 - Progressing by Brittany [name], Brittany Joplin E, RN Note IV steroids, IV abx, 5lnc.  Cardiovascular complication will be avoided 01/31/2017 [id number] - Progressing by Brittany [name], Brittany Cardell E, RN Nutrition: Adequate nutrition will be maintained 01/31/2017 0929 - Progressing by Brittany [name], Kayson Bullis E, RN Coping: Level of anxiety will decrease 01/31/2017 0929 - Progressing by Brittany [name], Haeden Hudock E, RN Elimination: Will not experience complications related to bowel motility 01/31/2017 0929 - Progressing by Brittany [name], Barbar Brede E, RN Pain Managment: General experience of comfort will improve 01/31/2017 0929 - Progressing by Brittany [name], Tyion Boylen E, RN Safety: Ability to remain free from injury will improve 01/31/2017 0929 - Progressing by Brittany [name], Breyonna Nault E, RN Skin Integrity: Risk for impaired skin integrity will decrease 01/31/2017 0929 - Progressing by Brittany [name], Tita Terhaar E, RN Activity: Ability to implement measures to reduce episodes of fatigue will improve 01/31/2017 0929 - Progressing by Brittany [name], Mischelle Reeg E, RN Will verbalize the importance of balancing activity with adequate rest periods 01/31/2017 0929 - Progressing by Brittany [name], Zaeda Mcferran E, RN Education: Knowledge of disease or condition will improve 01/31/2017 0929 - Progressing by Brittany [name], Mckenzie Toruno E, RN Knowledge of the prescribed therapeutic  regimen will improve 01/31/2017 0929 - Progressing by Brittany [name], Deke Tilghman E, RN Coping: Ability to identify and develop effective coping behavior will improve 01/31/2017 0929 - Progressing by Brittany [name], Kyrese Gartman E, RN Level of anxiety will decrease 01/31/2017 0929 - Progressing by Brittany [name], Caitriona Sundquist E, RN Health Behavior/Discharge Planning: Ability to manage health-related needs will improve 01/31/2017 0929 - Progressing by Brittany [name], Logan Baltimore E, RN Nutritional: Maintenance of adequate nutrition will improve 01/31/2017 0929 - Progressing by Brittany [name], Kesley Gaffey E, RN Respiratory: Ability to maintain a clear airway will improve 01/31/2017 0929 - Progressing by Brittany [name], Lynna Zamorano E, RN Note Strong, productive cough.  Ability to maintain adequate ventilation will improve 01/31/2017 0929 - Progressing by Brittany [name], Novice Vrba E, RN Complications related to the disease process, condition or treatment will be avoided or minimized 01/31/2017 0929 - Progressing by Brittany [name], Jaylon Grode E, RN Pain Management: Expressions of feelings of enhanced comfort will increase 01/31/2017 0929 - Progressing by Brittany [name], Dulcemaria Bula E, RN

## 2017-01-31 NOTE — Progress Notes (Signed)
Name: Brittany [name] MRN: [id number] DOB: 1955/12/11    ADMISSION DATE:  01/27/2017 CONSULTATION DATE:  01/30/2017  REFERRING MD :  Burnell BlanksMyers, TRH  CHIEF COMPLAINT:  Pneumonia not improving and new O2 demand  BRIEF PATIENT DESCRIPTION: 61 year old female with 35 pack year history of smoking who actively smoked till admission whose daughter (who lives with her) [facility] of [facility].  Patient, daughter and roommate all smoke in the house.  No recent change in her living environment.  Usually produces a lot of phlegm but since she has been sick have produced no phlegm.  No fever at home or in the hospital.  Patient had a CXR that I reviewed myself that shows subtle infiltrate.  Patient has no previous diagnosis of COPD (her cardiologist diagnosed her but unsure how).  On no O2 or inhalers at home.  Reports to have never checked her O2 sat prior.  PCCM asked to evaluate for hypoxemia and the fact that she is not improving.  SIGNIFICANT EVENTS  11/9 admission 11/12 PCCM consult  STUDIES:  CXR with subtle infiltrate.  Subjective: Feels better since admission  HISTORY OF PRESENT ILLNESS:  61 year old female with 35 pack year history of smoking who actively smoked till admission whose daughter (who lives with her) was [facility] with [facility] of [facility].  Patient, daughter and roommate all smoke in the house.  No recent change in her living environment.  Usually produces a lot of phlegm but since she has been sick have produced no phlegm.  No fever at home or in the hospital.  Patient had a CXR that I reviewed myself that shows subtle infiltrate.  Patient has no previous diagnosis of COPD (her cardiologist diagnosed her but unsure how).  On no O2 or inhalers at home.  Reports to have never checked her O2 sat prior.  PCCM asked to evaluate for hypoxemia and the fact that she is not improving.  SUBJECTIVE:  Feels better this AM,  continues to have O2 demand  VITAL SIGNS: Temp:  [97.6 F (36.4 C)-98.3 F (36.8 C)] 97.6 F (36.4 C) (11/13 0900) Pulse Rate:  [113-138] 116 (11/13 0900) Resp:  [22-24] 24 (11/13 0420) BP: (116-134)/(82-95) 128/86 (11/13 0900) SpO2:  [93 %-97 %] 95 % (11/13 0900)  PHYSICAL EXAMINATION: General:  Chronically ill appearing female, NAD, appears older than stated age Neuro:  Alert and interactive, moving all ext to command HEENT:  [place]/AT, PERRL, EOM-I and MMM Cardiovascular:  RRR, Nl S1/S2, -M/R/G. Lungs:  Distant BS diffusely, no wheezing Abdomen:  Soft, NT, ND and +BS Musculoskeletal:  -edema and -tenderness Skin:  Intact  Recent Labs  Lab 01/29/17 0515 01/30/17 0450 01/31/17 0418  NA 143 142 139  K 2.7* 5.3* 4.9  CL 106 110 104  CO2 22 22 24   BUN 24* 45* 50*  CREATININE 0.75 0.78 0.85  GLUCOSE 183* 191* 190*   Recent Labs  Lab 01/29/17 0515 01/30/17 0450 01/31/17 0418  HGB 14.4 15.5* 15.6*  HCT 41.1 44.9 45.4  WBC 16.5* 18.3* 17.2*  PLT 299 342 322   I reviewed CXR myself, hyperinflation noted  ASSESSMENT / PLAN:  61 year old female with presumed diagnosis of COPD after a 35 pack-year history of smoking now presenting with a pneumonia and new O2 demand.  As discussed prior, patient did have a health care exposure but seems to clinically be responding to levofloxacin.  Discussed with TRH-MD  and PCCM-NP.  Pneumonia:  - Continue levofloxacin for a total of 8 days, may change to PO as bioavailability is the same  - F/U on cultures, NTD.  COPD:  - Continue dulera  - Decreased solumedrol to 60 mg IV q12 x3 more days then switch to PO prednisone with taper when closer to discharge  - Will need PFTs as outpatient  - When closer to discharge please arrange f/u with PCCM  Hypoxemia: wonder if patient has been hypoxemic at home and we are just catching this.  Patient is ok with going home on home O2 and understands that it can be permanent.  - Titrate O2 for sat of  88-92%  - Ambulatory desat study ordered for home O.  Tobacco abuse:  - Smoking cessation  - Discussed with patient, she is quitting and daughter and roommate will no longer be smoking in the house.  Alyson ReedyWesam G. Amorah Sebring, M.D. [facility]. Pager: [phone]. After hours pager: [phone].  01/31/2017, 9:14 AM

## 2017-01-31 NOTE — Progress Notes (Addendum)
Patient ID: Brittany [name], female   DOB: [birth year], 61 y.o.   MRN: [id number]    PROGRESS NOTE  Reba Hulett  WGN:[id number] DOB: September 19, 1955 DOA: 01/27/2017  PCP: Orpah Cobb, MD   Brief Narrative:  Pt is 61 yo female with HTN, known smoker, presented with dyspnea, cough productive of yellow sputum, subjective fevers, wheezing.   Assessment & Plan:   Principal Problem:   Acute resp failure secondary to acute COPD exacerbation (HCC), bilateral PNA - pt reports feeling better overall but still with increased WOB especially with exertion  - continue dulera - continue solumedrol 60 mg IV BID for 3 more days per PCCM and plan to switch to PO Prednisone with taper when closer to discharge   - pt will need outpatient PFT and follow up with PCCM arranged  - oxygen saturations dropped with ambulation so will place order for oxygen on discharge  - continue Levaquin day #4/8  Active Problems:   TOBACCO ABUSE - counseled on cessation  - Declined need for nicotine patch - pt reports she is ready to quit     CAP (community acquired pneumonia), bilateral - Has been on Levaquin as noted above    Chronic diastolic CHF - Recent echocardiogram with EF 55%, grade 1 diastolic CHF (11/2016) - Patient has not been on Lasix at home, has been given dose of Lasix IV here inpatient - Weight on admission Filed Weights   01/27/17 1324 01/27/17 1853  Weight: 51.3 kg (113 lb) 53.5 kg (117 lb 15.1 oz)  - monitor daily weights, I/O    Tachycardia - reactive, continue to treat infection     Hypokalemia - Has been supplemented but now oh higher end - Stop supplementing, repeat BMP in the morning  DVT prophylaxis: Lovenox SQ Code Status: Full  Family Communication: pt at bedside  Disposition Plan: to be determined, when off solumedrol IV and when PCCM clears   Consultants:   PCCM  Procedures:   None  Antimicrobials:   Levaquin 11/10 -->  Subjective: Pt reports feeling better overall  but still with exertional dyspnea.   Objective: Vitals:   01/31/17 0420 01/31/17 0821 01/31/17 0824 01/31/17 0900  BP: (!) 134/95   128/86  Pulse: (!) 120   (!) 116  Resp: (!) 24     Temp: 97.7 F (36.5 C)   97.6 F (36.4 C)  TempSrc: Oral   Oral  SpO2: 97% 95% 95% 95%  Weight:      Height:        Intake/Output Summary (Last 24 hours) at 01/31/2017 1317 Last data filed at 01/31/2017 1200 Gross per 24 hour  Intake 730 ml  Output 200 ml  Net 530 ml   Filed Weights   01/27/17 1324 01/27/17 1853  Weight: 51.3 kg (113 lb) 53.5 kg (117 lb 15.1 oz)   Physical Exam  Constitutional: Appears stable, NAD CVS: tachycardic, S1/S2 +, no murmurs, no gallops, no carotid bruit.  Pulmonary: Effort and breath sounds normal, no wheezing, mild rhonchi at bases  Abdominal: Soft. BS +,  no distension, tenderness, rebound or guarding.  Musculoskeletal: Normal range of motion. No edema and no tenderness.  Neuro: Alert. Normal reflexes, muscle tone coordination. No cranial nerve deficit. Skin: Skin is warm and dry. No rash noted. Not diaphoretic. No erythema. No pallor.  Psychiatric: Normal mood and affect. Behavior, judgment, thought content normal.    Data Reviewed: I have personally reviewed following labs and imaging studies  CBC: Recent Labs  Lab  01/27/17 1401 01/28/17 0514 01/29/17 0515 01/30/17 0450 01/31/17 0418  WBC 9.3 6.1 16.5* 18.3* 17.2*  HGB 15.4* 13.0 14.4 15.5* 15.6*  HCT 43.4 37.5 41.1 44.9 45.4  MCV 88.2 87.8 88.0 88.7 88.8  PLT 219 221 299 342 322   Basic Metabolic Panel: Recent Labs  Lab 01/27/17 1401 01/28/17 0514 01/29/17 0515 01/30/17 0450 01/31/17 0418  NA 133* 140 143 142 139  K 3.1* 3.5 2.7* 5.3* 4.9  CL 97* 110 106 110 104  CO2 23 22 22 22 24   GLUCOSE 178* 166* 183* 191* 190*  BUN 16 13 24* 45* 50*  CREATININE 0.78 0.54 0.75 0.78 0.85  CALCIUM 8.8* 8.4* 9.1 9.3 9.0  MG  --   --  1.9 2.3  --    Liver Function Tests: Recent Labs  Lab  01/28/17 0514  AST 15  ALT 14  ALKPHOS 56  BILITOT 0.5  PROT 6.2*  ALBUMIN 3.1*   Urine analysis:    Component Value Date/Time   COLORURINE YELLOW 07/11/2013 1933   APPEARANCEUR CLEAR 07/11/2013 1933   LABSPEC 1.007 07/11/2013 1933   PHURINE 8.0 07/11/2013 1933   GLUCOSEU NEGATIVE 07/11/2013 1933   HGBUR NEGATIVE 07/11/2013 1933   BILIRUBINUR NEGATIVE 07/11/2013 1933   KETONESUR NEGATIVE 07/11/2013 1933   PROTEINUR NEGATIVE 07/11/2013 1933   UROBILINOGEN 0.2 07/11/2013 1933   NITRITE NEGATIVE 07/11/2013 1933   LEUKOCYTESUR NEGATIVE 07/11/2013 1933   Recent Results (from the past 240 hour(s))  Blood Culture (routine x 2)     Status: None (Preliminary result)   Collection Time: 01/27/17  3:04 PM  Result Value Ref Range Status   Specimen Description BLOOD LEFT ANTECUBITAL  Final   Special Requests   Final    BOTTLES DRAWN AEROBIC AND ANAEROBIC Blood Culture results may not be optimal due to an inadequate volume of blood received in culture bottles   Culture   Final    NO GROWTH 4 DAYS Performed at [facility], [address] [place], [place] [address]    Report Status PENDING  Incomplete  Blood Culture (routine x 2)     Status: None (Preliminary result)   Collection Time: 01/27/17  7:19 PM  Result Value Ref Range Status   Specimen Description BLOOD LEFT HAND  Final   Special Requests   Final    BOTTLES DRAWN AEROBIC ONLY Blood Culture adequate volume   Culture   Final    NO GROWTH 4 DAYS Performed at [facility], [address] [place], [place] [address]    Report Status PENDING  Incomplete  Culture, expectorated sputum-assessment     Status: None   Collection Time: 01/31/17  8:58 AM  Result Value Ref Range Status   Specimen Description EXPECTORATED SPUTUM  Final   Special Requests NONE  Final   Sputum evaluation THIS SPECIMEN IS ACCEPTABLE FOR SPUTUM CULTURE  Final   Report Status 01/31/2017 FINAL  Final  Culture, respiratory (NON-Expectorated)      Status: None (Preliminary result)   Collection Time: 01/31/17  8:58 AM  Result Value Ref Range Status   Specimen Description EXPECTORATED SPUTUM  Final   Special Requests NONE Reflexed from [id number]  Final   Gram Stain   Final    RARE WBC PRESENT, PREDOMINANTLY PMN RARE GRAM POSITIVE COCCI FEW BUDDING YEAST SEEN Performed at [facility], [address] [place], [place] [address]    Culture PENDING  Incomplete   Report Status PENDING  Incomplete    Radiology Studies: No results found. Scheduled Meds: . enoxaparin (LOVENOX) injection  40 mg Subcutaneous Q24H  . ipratropium-albuterol  3 mL Nebulization Q6H  . losartan  50 mg Oral Daily  . methylPREDNISolone (SOLU-MEDROL) injection  60 mg Intravenous Q12H  . mometasone-formoterol  2 puff Inhalation BID   Continuous Infusions: . levofloxacin (LEVAQUIN) IV Stopped (01/30/17 1801)     LOS: 4 days   Time spent: 25 minutes   Debbora PrestoIskra Magick-Myers, MD [facility] Pager [phone]  If 7PM-7AM, please contact night-coverage [email or web address] Password [email or web address] 01/31/2017, 1:17 PM

## 2017-01-31 NOTE — Progress Notes (Signed)
SATURATION QUALIFICATIONS: (This note is used to comply with regulatory documentation for home oxygen)  Patient Saturations on Room Air at Rest = 94%  Patient Saturations on Room Air while Ambulating = 87%    Patient Saturations on 5Liters of oxygen while recovering after sitting back in bed = 93%  Please briefly explain why patient needs home oxygen: Pt with significant dyspnea just standing up to the edge of bed with O2 sat dropping to 87% on RA.  Pt assisted back to bed, O2 sat recovered to 93% on 5lnc within one minute of resting in bed.

## 2017-01-31 NOTE — Progress Notes (Signed)
Inpatient Diabetes Program Recommendations  AACE/ADA: New Consensus Statement on Inpatient Glycemic Control (2015)  Target Ranges:  Prepandial:   less than 140 mg/dL      Peak postprandial:   less than 180 mg/dL (1-2 hours)      Critically ill patients:  140 - 180 mg/dL    Results for Brittany [name], Brittany [name] (MRN [id number]) as of 01/31/2017 09:27  Ref. Range 01/29/2017 05:15 01/30/2017 04:50 01/31/2017 04:18  Glucose Latest Ref Range: 65 - 99 mg/dL 213183 (H) 086191 (H) 578190 (H)   Admit with: Pneumonia  NO History of DM noted     MD- Note patient getting Solumedrol 60 mg BID.  Lab glucose levels elevated for the past 3 days.  Please consider placing orders for Novolog Sensitive Correction Scale/ SSI (0-9 units) TID AC + HS       --Will follow patient during hospitalization--  Ambrose FinlandJeannine Johnston Renji Berwick RN, MSN, CDE Diabetes Coordinator Inpatient Glycemic Control Team Team Pager: [phone] (8a-5p)

## 2017-02-01 DIAGNOSIS — E876 Hypokalemia: Secondary | ICD-10-CM

## 2017-02-01 DIAGNOSIS — J9601 Acute respiratory failure with hypoxia: Secondary | ICD-10-CM

## 2017-02-01 LAB — CULTURE, BLOOD (ROUTINE X 2)
CULTURE: NO GROWTH
CULTURE: NO GROWTH
SPECIAL REQUESTS: ADEQUATE

## 2017-02-01 LAB — CBC
HEMATOCRIT: 43.2 % (ref 36.0–46.0)
Hemoglobin: 14.6 g/dL (ref 12.0–15.0)
MCH: 30.2 pg (ref 26.0–34.0)
MCHC: 33.8 g/dL (ref 30.0–36.0)
MCV: 89.4 fL (ref 78.0–100.0)
Platelets: 271 10*3/uL (ref 150–400)
RBC: 4.83 MIL/uL (ref 3.87–5.11)
RDW: 14 % (ref 11.5–15.5)
WBC: 23.8 10*3/uL — ABNORMAL HIGH (ref 4.0–10.5)

## 2017-02-01 LAB — BASIC METABOLIC PANEL
Anion gap: 8 (ref 5–15)
BUN: 49 mg/dL — AB (ref 6–20)
CALCIUM: 8.7 mg/dL — AB (ref 8.9–10.3)
CHLORIDE: 105 mmol/L (ref 101–111)
CO2: 25 mmol/L (ref 22–32)
CREATININE: 0.73 mg/dL (ref 0.44–1.00)
GFR calc Af Amer: >60 mL/min (ref >60)
GFR calc non Af Amer: >60 mL/min (ref >60)
GLUCOSE: 181 mg/dL — AB (ref 65–99)
Potassium: 4.6 mmol/L (ref 3.5–5.1)
Sodium: 138 mmol/L (ref 135–145)

## 2017-02-01 LAB — MAGNESIUM: Magnesium: 2.4 mg/dL (ref 1.7–2.4)

## 2017-02-01 MED ORDER — HYDROCOD POLST-CPM POLST ER 10-8 MG/5ML PO SUER
5.0000 mL | Freq: Two times a day (BID) | ORAL | Status: DC | PRN
  Administered 2017-02-01 – 2017-02-02 (×3): 5 mL via ORAL
  Filled 2017-02-01 (×3): qty 5

## 2017-02-01 MED ORDER — LEVOFLOXACIN 750 MG PO TABS
750.0000 mg | ORAL_TABLET | ORAL | Status: DC
  Administered 2017-02-01 – 2017-02-02 (×2): 750 mg via ORAL
  Filled 2017-02-01 (×2): qty 1

## 2017-02-01 MED ORDER — IPRATROPIUM-ALBUTEROL 0.5-2.5 (3) MG/3ML IN SOLN
3.0000 mL | Freq: Three times a day (TID) | RESPIRATORY_TRACT | Status: DC
  Administered 2017-02-02 (×2): 3 mL via RESPIRATORY_TRACT
  Filled 2017-02-01 (×2): qty 3

## 2017-02-01 NOTE — Progress Notes (Signed)
    Durable Medical Equipment  (From admission, onward)        Start     Ordered   01/31/17 1323  For home use only DME oxygen  Once    Question Answer Comment  Mode or (Route) Nasal cannula   Liters per Minute 2   Frequency Continuous (stationary and portable oxygen unit needed)   Oxygen conserving device Yes   Oxygen delivery system Gas      01/31/17 1322

## 2017-02-01 NOTE — Progress Notes (Signed)
Name: Brittany [name] MRN: [id number] DOB: 06-23-1955    ADMISSION DATE:  01/27/2017 CONSULTATION DATE:  01/30/2017  REFERRING MD :  Burnell BlanksMyers, TRH  CHIEF COMPLAINT:  Pneumonia not improving and new O2 demand  BRIEF PATIENT DESCRIPTION: 61 year old female with 35 pack year history of smoking who actively smoked till admission whose daughter (who lives with her) was [facility].  Patient, daughter and roommate all smoke in the house.  No recent change in her living environment.  Usually produces a lot of phlegm but since she has been sick have produced no phlegm.  No fever at home or in the hospital.  Patient had a CXR that I reviewed myself that shows subtle infiltrate.  Patient has no previous diagnosis of COPD (her cardiologist diagnosed her but unsure how).  On no O2 or inhalers at home.  Reports to have never checked her O2 sat prior.  PCCM asked to evaluate for hypoxemia and the fact that she is not improving.  SIGNIFICANT EVENTS  11/9 admission 11/12 PCCM consult  STUDIES:  CXR with subtle infiltrate.  Subjective: Feels better since admission  HISTORY OF PRESENT ILLNESS:  61 year old female with 35 pack year history of smoking who actively smoked till admission whose daughter (who lives with her) was [facility] of [facility].  Patient, daughter and roommate all smoke in the house.  No recent change in her living environment.  Usually produces a lot of phlegm but since she has been sick have produced no phlegm.  No fever at home or in the hospital.  Patient had a CXR that I reviewed myself that shows subtle infiltrate.  Patient has no previous diagnosis of COPD (her cardiologist diagnosed her but unsure how).  On no O2 or inhalers at home.  Reports to have never checked her O2 sat prior.  PCCM asked to evaluate for hypoxemia and the fact that she is not improving.  SUBJECTIVE:  Feels better this AM,  ambulatory desat study failed  VITAL SIGNS: Temp:  [97.5 F (36.4 C)] 97.5 F (36.4 C) (11/14 0511) Pulse Rate:  [110-113] 110 (11/14 0511) Resp:  [19-20] 20 (11/14 0511) BP: (119-134)/(85-97) 130/87 (11/14 0511) SpO2:  [94 %-98 %] 94 % (11/14 0759) Weight:  [48.2 kg (106 lb 4.2 oz)] 48.2 kg (106 lb 4.2 oz) (11/14 0511)  PHYSICAL EXAMINATION: General:  Chronically ill appearing female, NAD Neuro:  Alert and interactive, moving all ext to command HEENT:  [place]/AT, PERRL, EOM-I and MMM Cardiovascular:  RRR, Nl S1/S2, -M/R/G. Lungs:  Decreased BS diffusely, no wheezing noted Abdomen:  Soft, NT, ND and +BS Musculoskeletal:  -edema and -tenderness Skin:  Intact  Recent Labs  Lab 01/30/17 0450 01/31/17 0418 02/01/17 0437  NA 142 139 138  K 5.3* 4.9 4.6  CL 110 104 105  CO2 22 24 25   BUN 45* 50* 49*  CREATININE 0.78 0.85 0.73  GLUCOSE 191* 190* 181*   Recent Labs  Lab 01/30/17 0450 01/31/17 0418 02/01/17 0437  HGB 15.5* 15.6* 14.6  HCT 44.9 45.4 43.2  WBC 18.3* 17.2* 23.8*  PLT 342 322 271   I reviewed CXR myself, hyperinflation noted.  ASSESSMENT / PLAN:  61 year old female with presumed diagnosis of COPD after a 35 pack-year history of smoking now presenting with a pneumonia and new O2 demand.  As discussed prior, patient did have a health care exposure but seems to clinically  be responding to levofloxacin.  Discussed with PCCM-NP.  Pneumonia:  - Switch levofloxacin to PO with total abx days of 8 days  - F/U on cultures, NTD.  COPD:  - Continue dulera, needs to go home with that  - Decreased solumedrol to 60 mg IV q12 x2 more days then switch to PO prednisone with taper when closer to discharge  - PFT as outpatient  - When ready for D/C please arrange for PCCM outpatient f/u.  Hypoxemia: wonder if patient has been hypoxemic at home and we are just catching this.  Patient is ok with going home on home O2 and understands that it can be permanent.  - Titrate O2 for  sat of 88-92%  - Needs to arrange for home O2  Tobacco abuse:  - Smoking cessation  - Discussed with patient, she is quitting and daughter and roommate will no longer be smoking in the house.  PCCM will sign off, please call back if needed.  Alyson ReedyWesam G. Neymar Dowe, M.D. [facility]. Pager: [phone]. After hours pager: [phone].  02/01/2017, 10:15 AM

## 2017-02-01 NOTE — Care Management Note (Signed)
Case Management Note  Patient Details  Name: Brittany [name] MRN: [id number] Date of Birth: 12-01-1955  Subjective/Objective: 61 y/o f admitted w/COPD. From home. Qualifies for home 02, & ordered-[facility] aware & will deliver @ d/c to rm prior d/c.                   Action/Plan:d/c home w/home 02.   Expected Discharge Date:  02/01/17               Expected Discharge Plan:  Home/Self Care  In-House Referral:     Discharge planning Services  CM Consult  Post Acute Care Choice:    Choice offered to:  Patient  DME Arranged:  Oxygen DME Agency:  [facility]  [facility]     Status of Service:  In process, will continue to follow  If discussed at Long Length of Stay Meetings, dates discussed:    Additional Comments:  [name], [name], RN 02/01/2017, 10:50 AM

## 2017-02-01 NOTE — Progress Notes (Signed)
Patient ID: Brittany [name], female   DOB: 1955/07/17, 61 y.o.   MRN: [id number]    PROGRESS NOTE  Brittany Pepperserry Wisnieski  YNW:[id number] DOB: 1955/07/17 DOA: 01/27/2017  PCP: Orpah CobbKadakia, Ajay, MD   Brief Narrative:  Pt is 61 yo female with HTN, known smoker, presented with dyspnea, cough productive of yellow sputum, subjective fevers, wheezing.   Assessment & Plan:   Principal Problem:   Acute resp failure secondary to acute COPD exacerbation (HCC), bilateral PNA -overall better, no fever and with improved breathing -still requiring oxygen supplementation, with wheezing and with mild difficulty speaking in full sentences. Also with SOB on exertion and intermittent coughing spells. -will continue Dulera, with plans to discharge her home on it. -continue solumedrol 60 mg IV BID for 1 more day as recommended by PCCM and plan to switch to PO Prednisone with tapering at discharge -continue duoneb and continue abx's, but now changed to PO - pt will need outpatient PFT and follow up with pulmonary service. -continue flutter valve usage and start PRN tussionex -will arrange home oxygen.    TOBACCO ABUSE - cessation counseling once again encouraged. -patient decline nicotine patch -patient advise/educated about importance in avoiding second hand smoking as well.     CAP (community acquired pneumonia), bilateral - continue treatment with levaquin; but transition to PO -no fever -no nausea, no vomiting -wbc's up from steroids most likely  -cultures has remained neg.    Chronic diastolic CHF - Recent echocardiogram with EF 55%, grade 1 diastolic CHF (11/2016) - Patient has not been on Lasix at home, has been given dose of Lasix IV here inpatient - Weight on admission Filed Weights   01/27/17 1324 01/27/17 1853 02/01/17 0511  Weight: 51.3 kg (113 lb) 53.5 kg (117 lb 15.1 oz) 48.2 kg (106 lb 4.2 oz)  - continue to monitor daily weights, I/O -will assess need of low dose lasix at discharge -education  regarding heart healthy diet and low sodium provided.    Tachycardia -reactive, and associated with nebulization -will monitor on telemetry     Hypokalemia -stable after repletion -will monitor.  DVT prophylaxis: Lovenox SQ Code Status: Full  Family Communication: pt at bedside  Disposition Plan: transitioning abx's to PO and after today's dosage will transition steroids to PO as well. Assess/arrange oxygen for discharge. Appreciate rec's from pulmonary service.  Consultants:   PCCM  Procedures:   None  Antimicrobials:   Levaquin 11/10 -->  Subjective: No fever, no nausea, no vomiting. Still with SOB and needing oxygen supplementation, but much better overall.  Objective: Vitals:   02/01/17 0759 02/01/17 1500 02/01/17 2012 02/01/17 2155  BP:  111/70  119/72  Pulse:  (!) 103  98  Resp:  20  20  Temp:  97.6 F (36.4 C)  98 F (36.7 C)  TempSrc:  Oral  Oral  SpO2: 94% 94% 92% 95%  Weight:      Height:        Intake/Output Summary (Last 24 hours) at 02/01/2017 2324 Last data filed at 02/01/2017 1743 Gross per 24 hour  Intake 360 ml  Output 250 ml  Net 110 ml   Filed Weights   01/27/17 1324 01/27/17 1853 02/01/17 0511  Weight: 51.3 kg (113 lb) 53.5 kg (117 lb 15.1 oz) 48.2 kg (106 lb 4.2 oz)   Physical Exam  Constitutional: afebrile, no nausea, no vomiting, with positive intermittent coughing spells, still SOB and requiring oxygen supplementation. CVS: no rubs, no gallops, S1 and S2 Pulmonary: no  using accessory muscles, positive rhonchi and mild exp wheezing. No crackles. Abdominal: soft, NT, ND, positive BS Musculoskeletal: no edema, no cyanosis Skin: no open wounds, no rash, no petechiae.   Data Reviewed: I have personally reviewed following labs and imaging studies  CBC: Recent Labs  Lab 01/28/17 0514 01/29/17 0515 01/30/17 0450 01/31/17 0418 02/01/17 0437  WBC 6.1 16.5* 18.3* 17.2* 23.8*  HGB 13.0 14.4 15.5* 15.6* 14.6  HCT 37.5 41.1 44.9  45.4 43.2  MCV 87.8 88.0 88.7 88.8 89.4  PLT 221 299 342 322 271   Basic Metabolic Panel: Recent Labs  Lab 01/28/17 0514 01/29/17 0515 01/30/17 0450 01/31/17 0418 02/01/17 0437  NA 140 143 142 139 138  K 3.5 2.7* 5.3* 4.9 4.6  CL 110 106 110 104 105  CO2 22 22 22 24 25   GLUCOSE 166* 183* 191* 190* 181*  BUN 13 24* 45* 50* 49*  CREATININE 0.54 0.75 0.78 0.85 0.73  CALCIUM 8.4* 9.1 9.3 9.0 8.7*  MG  --  1.9 2.3  --  2.4   Liver Function Tests: Recent Labs  Lab 01/28/17 0514  AST 15  ALT 14  ALKPHOS 56  BILITOT 0.5  PROT 6.2*  ALBUMIN 3.1*   Urine analysis:    Component Value Date/Time   COLORURINE YELLOW 07/11/2013 1933   APPEARANCEUR CLEAR 07/11/2013 1933   LABSPEC 1.007 07/11/2013 1933   PHURINE 8.0 07/11/2013 1933   GLUCOSEU NEGATIVE 07/11/2013 1933   HGBUR NEGATIVE 07/11/2013 1933   BILIRUBINUR NEGATIVE 07/11/2013 1933   KETONESUR NEGATIVE 07/11/2013 1933   PROTEINUR NEGATIVE 07/11/2013 1933   UROBILINOGEN 0.2 07/11/2013 1933   NITRITE NEGATIVE 07/11/2013 1933   LEUKOCYTESUR NEGATIVE 07/11/2013 1933   Recent Results (from the past 240 hour(s))  Blood Culture (routine x 2)     Status: None   Collection Time: 01/27/17  3:04 PM  Result Value Ref Range Status   Specimen Description BLOOD LEFT ANTECUBITAL  Final   Special Requests   Final    BOTTLES DRAWN AEROBIC AND ANAEROBIC Blood Culture results may not be optimal due to an inadequate volume of blood received in culture bottles   Culture   Final    NO GROWTH 5 DAYS Performed at [facility], [address] [place], [place] [address]    Report Status 02/01/2017 FINAL  Final  Blood Culture (routine x 2)     Status: None   Collection Time: 01/27/17  7:19 PM  Result Value Ref Range Status   Specimen Description BLOOD LEFT HAND  Final   Special Requests   Final    BOTTLES DRAWN AEROBIC ONLY Blood Culture adequate volume   Culture   Final    NO GROWTH 5 DAYS Performed at [facility],  [address] [place], [place] [address]    Report Status 02/01/2017 FINAL  Final  Culture, expectorated sputum-assessment     Status: None   Collection Time: 01/31/17  8:58 AM  Result Value Ref Range Status   Specimen Description EXPECTORATED SPUTUM  Final   Special Requests NONE  Final   Sputum evaluation THIS SPECIMEN IS ACCEPTABLE FOR SPUTUM CULTURE  Final   Report Status 01/31/2017 FINAL  Final  Culture, respiratory (NON-Expectorated)     Status: None (Preliminary result)   Collection Time: 01/31/17  8:58 AM  Result Value Ref Range Status   Specimen Description EXPECTORATED SPUTUM  Final   Special Requests NONE Reflexed from [id number]  Final   Gram  Stain   Final    RARE WBC PRESENT, PREDOMINANTLY PMN RARE GRAM POSITIVE COCCI FEW BUDDING YEAST SEEN    Culture   Final    CULTURE REINCUBATED FOR BETTER GROWTH Performed at [facility], [address], [place], [place] [address]    Report Status PENDING  Incomplete    Radiology Studies: No results found. Scheduled Meds: . enoxaparin (LOVENOX) injection  40 mg Subcutaneous Q24H  . [START ON 02/02/2017] ipratropium-albuterol  3 mL Nebulization TID  . levofloxacin  750 mg Oral Q24H  . losartan  50 mg Oral Daily  . methylPREDNISolone (SOLU-MEDROL) injection  60 mg Intravenous Q12H  . mometasone-formoterol  2 puff Inhalation BID   Continuous Infusions:    LOS: 5 days   Time spent: 25 minutes   Vassie Loll, MD [facility] Pager [phone]  If 7PM-7AM, please contact night-coverage [email or web address] Password [facility] 02/01/2017, 11:24 PM

## 2017-02-02 DIAGNOSIS — I10 Essential (primary) hypertension: Secondary | ICD-10-CM

## 2017-02-02 DIAGNOSIS — J9601 Acute respiratory failure with hypoxia: Secondary | ICD-10-CM

## 2017-02-02 MED ORDER — LOSARTAN POTASSIUM 50 MG PO TABS: 50.0000 mg | ORAL_TABLET | Freq: Every day | ORAL | 1 refills | Status: DC

## 2017-02-02 MED ORDER — LEVOFLOXACIN 750 MG PO TABS: 750.0000 mg | ORAL_TABLET | ORAL | 0 refills | Status: DC

## 2017-02-02 MED ORDER — MOMETASONE FURO-FORMOTEROL FUM 200-5 MCG/ACT IN AERO: 2.0000 | INHALATION_SPRAY | Freq: Two times a day (BID) | RESPIRATORY_TRACT | 2 refills | Status: DC

## 2017-02-02 MED ORDER — HYDROCOD POLST-CPM POLST ER 10-8 MG/5ML PO SUER: 5.0000 mL | Freq: Two times a day (BID) | ORAL | 0 refills | Status: DC | PRN

## 2017-02-02 MED ORDER — BENZONATATE 100 MG PO CAPS: 100.0000 mg | ORAL_CAPSULE | Freq: Three times a day (TID) | ORAL | 0 refills | Status: DC | PRN

## 2017-02-02 MED ORDER — IPRATROPIUM-ALBUTEROL 20-100 MCG/ACT IN AERS: 1.0000 | INHALATION_SPRAY | Freq: Four times a day (QID) | RESPIRATORY_TRACT | 2 refills | Status: DC | PRN

## 2017-02-02 MED ORDER — PREDNISONE 10 MG PO TABS: ORAL_TABLET | ORAL | 0 refills | Status: DC

## 2017-02-02 NOTE — Care Management Important Message (Signed)
Important Message  Patient Details  Name: Brittany [name] MRN: [id number] Date of Birth: 1955/08/04   Medicare Important Message Given:  Yes    Caren MacadamFuller, Julen Rubert 02/02/2017, 9:53 AMImportant Message  Patient Details  Name: Brittany [name] MRN: [id number] Date of Birth: 1955/08/04   Medicare Important Message Given:  Yes    Caren MacadamFuller, Reynalda Canny 02/02/2017, 9:53 AM

## 2017-02-02 NOTE — Progress Notes (Signed)
SATURATION QUALIFICATIONS: (This note is used to comply with regulatory documentation for home oxygen)  Patient Saturations on Room Air at Rest = 89%  Patient Saturations on Room Air while Ambulating = 85%  Patient Saturations on 2 Liters of oxygen while Ambulating = 92%

## 2017-02-02 NOTE — Discharge Summary (Signed)
Physician Discharge Summary  Brittany [name] [id number] DOB: 1955-11-01 DOA: 01/27/2017  PCP: Orpah CobbKadakia, Ajay, MD  Admit date: 01/27/2017 Discharge date: 02/02/2017  Time spent: 35 minutes  Recommendations for Outpatient Follow-up:  Repeat BMET to follow electrolytes and renal function  Assess patient oxygen needs and make sure she has set up appointment with pulmonary service as instructed. Repeat CXR in 4-6 weeks to assure resolution of infiltrates. Assess patient volume status and BP; if needed start diuretics as part of her regimen.  Discharge Diagnoses:  Principal Problem:   COPD with acute exacerbation (HCC) Active Problems:   TOBACCO ABUSE   Benign essential HTN   CAP (community acquired pneumonia)   Tachycardia   COPD exacerbation (HCC)   Hypokalemia   Acute respiratory failure with hypoxia (HCC)   Discharge Condition: stable and improved. Discharge home with arranged home oxygen and instructions to follow up with PCP and pulmonary service as an outpatient.  Diet recommendation: heart healthy diet   Filed Weights   01/27/17 1853 02/01/17 0511 02/02/17 0635  Weight: 53.5 kg (117 lb 15.1 oz) 48.2 kg (106 lb 4.2 oz) 47.7 kg (105 lb 2.6 oz)    History of present illness:  Pt is 61 yo female with HTN, known smoker, presented with dyspnea, cough productive of yellow sputum, subjective fevers and wheezing. Admitted secondary acute resp failure with hypoxia due to COPD exacerbation and CAP.  Hospital Course:  -overall much better, no fever and with improved breathing -still requiring oxygen supplementation, with very little exp wheezing and able to speak in full sentences  -reports improvement in her capacity to perform activities -[facility] with plans to discharge her home on it. -will also discharge on prednisone tapering and combivent.  -Pt will need outpatient PFT and follow up with pulmonary service. -continue flutter valve usage and PRN tussionex -will  arrange home oxygen at discharge.    TOBACCO ABUSE - cessation counseling once again encouraged. -patient decline nicotine patch -patient advise/educated about importance in avoiding second hand smoking as well.     CAP (community acquired pneumonia), bilateral -continue treatment with levaquin by mouth to complete antibiotic therapy -no fever, no nausea, no vomiting. -no nausea, no vomiting -wbc's up from steroids most likely, no CP and no SOB -cultures has remained neg. -repeat CXR in 4-6 weeks to assess resolution of infiltrates.    Chronic diastolic CHF -Recent echocardiogram with EF 55%, grade 1 diastolic CHF (11/2016) -Patient received some lasix while inpatient with excellent response -advise to follow daily weights and to follow low sodium diet -assess volume status and BP as an outpatient and determine need for diuretic regimen     Tachycardia -reactive, and associated with nebulizer therapy -HR stable and at baseline at discharge    Hypokalemia -stable after repletion -will monitor.   Procedures:  See below for x-ray reports   Consultations:  PCCM  Discharge Exam: Vitals:   02/02/17 0907 02/02/17 1421  BP:    Pulse: 95   Resp: 18   Temp:    SpO2: 97% 90%    Constitutional: afebrile, no nausea, no vomiting, with some intermittent coughing spells (which has improved with use of tussionex), still requiring oxygen supplementation, but with improved air movement and less SOB with activity.. CVS: no rubs, no gallops, S1 and S2 Pulmonary: no using accessory muscles, positive rhonchi and just very little exp wheezing. No crackles. Abdominal: soft, NT, ND, positive BS Musculoskeletal: no edema, no cyanosis Skin: no open wounds, no rash, no  petechiae.   Discharge Instructions   Discharge Instructions    Diet - low sodium heart healthy   Complete by:  As directed    Discharge instructions   Complete by:  As directed    Take medications as prescribed   Please follow up with PCP in 10 days Follow up with pulmonary service as instructed (in 2-3 weeks) Keep yourself well hydrated Please stop smoking and avoid second hand smoking     Current Discharge Medication List    START taking these medications   Details  benzonatate (TESSALON) 100 MG capsule Take 1 capsule (100 mg total) 3 (three) times daily as needed by mouth for cough. Qty: 20 capsule, Refills: 0    chlorpheniramine-HYDROcodone (TUSSIONEX) 10-8 MG/5ML SUER Take 5 mLs every 12 (twelve) hours as needed by mouth for cough. Qty: 140 mL, Refills: 0    Ipratropium-Albuterol (COMBIVENT RESPIMAT) 20-100 MCG/ACT AERS respimat Inhale 1 puff every 6 (six) hours as needed into the lungs for wheezing or shortness of breath. Qty: 1 Inhaler, Refills: 2    levofloxacin (LEVAQUIN) 750 MG tablet Take 1 tablet (750 mg total) daily by mouth. Qty: 2 tablet, Refills: 0    mometasone-formoterol (DULERA) 200-5 MCG/ACT AERO Inhale 2 puffs 2 (two) times daily into the lungs. Qty: 1 Inhaler, Refills: 2    predniSONE (DELTASONE) 10 MG tablet Take 5 tablet by mouth daily X 1 day; then 4 tablets by mouth daily X 2 days; then 3 tablets by mouth daily X 3 days; then 2 tablets by mouth daily X 2 days; then 1 tablet by mouth daily X 3 days and stop prednisone. Qty: 30 tablet, Refills: 0      CONTINUE these medications which have CHANGED   Details  losartan (COZAAR) 50 MG tablet Take 1 tablet (50 mg total) daily by mouth. Qty: 30 tablet, Refills: 1      CONTINUE these medications which have NOT CHANGED   Details  nitroGLYCERIN (NITROSTAT) 0.3 MG SL tablet Place 0.3 mg under the tongue every 5 (five) minutes as needed for chest pain (chest pain).       STOP taking these medications     albuterol (PROVENTIL HFA;VENTOLIN HFA) 108 (90 BASE) MCG/ACT inhaler      ibuprofen (ADVIL,MOTRIN) 600 MG tablet        Allergies  Allergen Reactions  . Amoxicillin Hives and Other (See Comments)     Whelps Has patient had a PCN reaction causing immediate rash, facial/tongue/throat swelling, SOB or lightheadedness with hypotension: Yes welps Has patient had a PCN reaction causing severe rash involving mucus membranes or skin necrosis: no Has patient had a PCN reaction that required hospitalization No Has patient had a PCN reaction occurring within the last 10 years: Yes If all of the above answers are "NO", then may proceed with Cephalosporin use.   . Sulfonamide Derivatives Nausea And Vomiting   Follow-up Information    [facility] - Dme Follow up.   Why:  home oxygen Contact information: [address] [place] [address] [phone]        Orpah Cobb, MD. Schedule an appointment as soon as possible for a visit in 10 day(s).   Specialty:  Cardiology Contact information: [address] [place] [address] [phone]        Alyson Reedy, MD. Schedule an appointment as soon as possible for a visit in 3 week(s).   Specialty:  Pulmonary Disease Contact information: [address]  [place] [address] [phone]            The results of significant diagnostics from this hospitalization (including imaging, microbiology, ancillary and laboratory) are listed below for reference.    Significant Diagnostic Studies: Dg Chest 2 View  Result Date: 01/27/2017 CLINICAL DATA:  Shortness of breath. EXAM: CHEST  2 VIEW COMPARISON:  Chest radiograph 11/28/2016. FINDINGS: Monitoring leads overlie the patient. Stable cardiac and mediastinal contours. Interval worsening diffuse bilateral coarse interstitial pulmonary opacities. No pleural effusion or pneumothorax. Thoracic spine degenerative changes. IMPRESSION: Interval development of diffuse bilateral coarse interstitial pulmonary opacities which may represent atypical infectious process or edema. Electronically Signed   By: Annia Belt M.D.   On: 01/27/2017 16:54   Dg Chest Port 1  View  Result Date: 01/29/2017 CLINICAL DATA:  Acute onset of shortness of breath and cough. EXAM: PORTABLE CHEST 1 VIEW COMPARISON:  Chest radiograph performed 01/27/2017 FINDINGS: The lungs are well-aerated. Mild right midlung and biapical opacities may reflect pneumonia, given the patient's symptoms. There is no evidence of pleural effusion or pneumothorax. The cardiomediastinal silhouette is within normal limits. No acute osseous abnormalities are seen. IMPRESSION: Mild right midlung and biapical opacities may reflect pneumonia, given the patient's symptoms. This has redistributed since the recent prior study. Electronically Signed   By: Roanna Raider M.D.   On: 01/29/2017 00:11    Microbiology: Recent Results (from the past 240 hour(s))  Blood Culture (routine x 2)     Status: None   Collection Time: 01/27/17  3:04 PM  Result Value Ref Range Status   Specimen Description BLOOD LEFT ANTECUBITAL  Final   Special Requests   Final    BOTTLES DRAWN AEROBIC AND ANAEROBIC Blood Culture results may not be optimal due to an inadequate volume of blood received in culture bottles   Culture   Final    NO GROWTH 5 DAYS Performed at [facility], [address], [place], [place] [address]    Report Status 02/01/2017 FINAL  Final  Blood Culture (routine x 2)     Status: None   Collection Time: 01/27/17  7:19 PM  Result Value Ref Range Status   Specimen Description BLOOD LEFT HAND  Final   Special Requests   Final    BOTTLES DRAWN AEROBIC ONLY Blood Culture adequate volume   Culture   Final    NO GROWTH 5 DAYS Performed at [facility], [address] [place], [place] [address]    Report Status 02/01/2017 FINAL  Final  Culture, expectorated sputum-assessment     Status: None   Collection Time: 01/31/17  8:58 AM  Result Value Ref Range Status   Specimen Description EXPECTORATED SPUTUM  Final   Special Requests NONE  Final   Sputum evaluation THIS SPECIMEN IS ACCEPTABLE FOR SPUTUM  CULTURE  Final   Report Status 01/31/2017 FINAL  Final  Culture, respiratory (NON-Expectorated)     Status: None (Preliminary result)   Collection Time: 01/31/17  8:58 AM  Result Value Ref Range Status   Specimen Description EXPECTORATED SPUTUM  Final   Special Requests NONE Reflexed from [id number]  Final   Gram Stain   Final    RARE WBC PRESENT, PREDOMINANTLY PMN RARE GRAM POSITIVE COCCI FEW BUDDING YEAST SEEN    Culture   Final    CULTURE REINCUBATED FOR BETTER GROWTH Performed at [facility], [address] [place], [place] [address]    Report Status PENDING  Incomplete  Labs: Basic Metabolic Panel: Recent Labs  Lab 01/28/17 0514 01/29/17 0515 01/30/17 0450 01/31/17 0418 02/01/17 0437  NA 140 143 142 139 138  K 3.5 2.7* 5.3* 4.9 4.6  CL 110 106 110 104 105  CO2 22 22 22 24 25   GLUCOSE 166* 183* 191* 190* 181*  BUN 13 24* 45* 50* 49*  CREATININE 0.54 0.75 0.78 0.85 0.73  CALCIUM 8.4* 9.1 9.3 9.0 8.7*  MG  --  1.9 2.3  --  2.4   Liver Function Tests: Recent Labs  Lab 01/28/17 0514  AST 15  ALT 14  ALKPHOS 56  BILITOT 0.5  PROT 6.2*  ALBUMIN 3.1*   CBC: Recent Labs  Lab 01/28/17 0514 01/29/17 0515 01/30/17 0450 01/31/17 0418 02/01/17 0437  WBC 6.1 16.5* 18.3* 17.2* 23.8*  HGB 13.0 14.4 15.5* 15.6* 14.6  HCT 37.5 41.1 44.9 45.4 43.2  MCV 87.8 88.0 88.7 88.8 89.4  PLT 221 299 342 322 271     Signed:  Vassie Lollarlos Keishana Klinger MD.  [facility] 02/02/2017, 3:52 PM

## 2017-02-02 NOTE — Care Management Note (Signed)
Case Management Note  Patient Details  Name: Brittany [name] MRN: [id number] Date of Birth: [birth year]  Subjective/Objective:   [facility] to deliver home 02 travel tank to rm prior d/c. No further CM needs.                 Action/Plan:d/c home.   Expected Discharge Date:  02/01/17               Expected Discharge Plan:  Home/Self Care  In-House Referral:     Discharge planning Services  CM Consult  Post Acute Care Choice:    Choice offered to:  Patient  DME Arranged:  Oxygen DME Agency:  [facility]  HH [facility]    [facility]     Status of Service:  Completed, signed off  If discussed at Long Length of Stay Meetings, dates discussed:    Additional Comments:  Brittany [name], Brittany Mcfetridge, RN 02/02/2017, 11:50 AM

## 2017-02-03 LAB — CULTURE, RESPIRATORY

## 2017-02-03 LAB — CULTURE, RESPIRATORY W GRAM STAIN: Culture: NORMAL

## 2017-02-16 ENCOUNTER — Observation Stay (HOSPITAL_COMMUNITY)
Admission: AD | Admit: 2017-02-16 | Discharge: 2017-02-18 | Disposition: A | Discharge status: Home / Self Care | Payer: Medicare HMO | Source: Ambulatory Visit | Attending: Cardiovascular Disease | Admitting: Cardiovascular Disease

## 2017-02-16 ENCOUNTER — Inpatient Hospital Stay (HOSPITAL_COMMUNITY): Payer: Medicare HMO

## 2017-02-16 DIAGNOSIS — Z880 Allergy status to penicillin: Secondary | ICD-10-CM | POA: Diagnosis not present

## 2017-02-16 DIAGNOSIS — R112 Nausea with vomiting, unspecified: Secondary | ICD-10-CM | POA: Insufficient documentation

## 2017-02-16 DIAGNOSIS — E860 Dehydration: Secondary | ICD-10-CM | POA: Diagnosis not present

## 2017-02-16 DIAGNOSIS — Z882 Allergy status to sulfonamides status: Secondary | ICD-10-CM | POA: Insufficient documentation

## 2017-02-16 DIAGNOSIS — F17210 Nicotine dependence, cigarettes, uncomplicated: Secondary | ICD-10-CM | POA: Diagnosis not present

## 2017-02-16 DIAGNOSIS — E876 Hypokalemia: Secondary | ICD-10-CM | POA: Diagnosis not present

## 2017-02-16 DIAGNOSIS — Z681 Body mass index (BMI) 19 or less, adult: Secondary | ICD-10-CM | POA: Diagnosis not present

## 2017-02-16 DIAGNOSIS — G309 Alzheimer's disease, unspecified: Secondary | ICD-10-CM | POA: Insufficient documentation

## 2017-02-16 DIAGNOSIS — J209 Acute bronchitis, unspecified: Secondary | ICD-10-CM | POA: Diagnosis present

## 2017-02-16 DIAGNOSIS — J441 Chronic obstructive pulmonary disease with (acute) exacerbation: Principal | ICD-10-CM | POA: Insufficient documentation

## 2017-02-16 DIAGNOSIS — R413 Other amnesia: Secondary | ICD-10-CM | POA: Insufficient documentation

## 2017-02-16 DIAGNOSIS — I10 Essential (primary) hypertension: Secondary | ICD-10-CM | POA: Diagnosis not present

## 2017-02-16 DIAGNOSIS — E440 Moderate protein-calorie malnutrition: Secondary | ICD-10-CM | POA: Diagnosis not present

## 2017-02-16 DIAGNOSIS — F0280 Dementia in other diseases classified elsewhere without behavioral disturbance: Secondary | ICD-10-CM | POA: Insufficient documentation

## 2017-02-16 DIAGNOSIS — F17200 Nicotine dependence, unspecified, uncomplicated: Secondary | ICD-10-CM | POA: Diagnosis present

## 2017-02-16 DIAGNOSIS — Z79899 Other long term (current) drug therapy: Secondary | ICD-10-CM | POA: Insufficient documentation

## 2017-02-16 DIAGNOSIS — R0602 Shortness of breath: Secondary | ICD-10-CM | POA: Diagnosis present

## 2017-02-16 DIAGNOSIS — R000 Tachycardia, unspecified: Secondary | ICD-10-CM | POA: Insufficient documentation

## 2017-02-16 LAB — COMPREHENSIVE METABOLIC PANEL
ALBUMIN: 2.8 g/dL — AB (ref 3.5–5.0)
ALK PHOS: 162 U/L — AB (ref 38–126)
ALT: 1330 U/L — AB (ref 14–54)
AST: 392 U/L — AB (ref 15–41)
Anion gap: 10 (ref 5–15)
BILIRUBIN TOTAL: 4.8 mg/dL — AB (ref 0.3–1.2)
BUN: 10 mg/dL (ref 6–20)
CO2: 21 mmol/L — ABNORMAL LOW (ref 22–32)
CREATININE: 0.66 mg/dL (ref 0.44–1.00)
Calcium: 8.3 mg/dL — ABNORMAL LOW (ref 8.9–10.3)
Chloride: 103 mmol/L (ref 101–111)
GFR calc Af Amer: >60 mL/min (ref >60)
GLUCOSE: 109 mg/dL — AB (ref 65–99)
Potassium: 2.8 mmol/L — ABNORMAL LOW (ref 3.5–5.1)
Sodium: 134 mmol/L — ABNORMAL LOW (ref 135–145)
Total Protein: 5.4 g/dL — ABNORMAL LOW (ref 6.5–8.1)

## 2017-02-16 LAB — CBC WITH DIFFERENTIAL/PLATELET
Basophils Absolute: 0 10*3/uL (ref 0.0–0.1)
Basophils Relative: 0 %
EOS ABS: 0.2 10*3/uL (ref 0.0–0.7)
Eosinophils Relative: 3 %
HCT: 35 % — ABNORMAL LOW (ref 36.0–46.0)
Hemoglobin: 12 g/dL (ref 12.0–15.0)
LYMPHS ABS: 1.9 10*3/uL (ref 0.7–4.0)
LYMPHS PCT: 31 %
MCH: 29.8 pg (ref 26.0–34.0)
MCHC: 34.3 g/dL (ref 30.0–36.0)
MCV: 86.8 fL (ref 78.0–100.0)
MONOS PCT: 7 %
Monocytes Absolute: 0.4 10*3/uL (ref 0.1–1.0)
NEUTROS PCT: 59 %
Neutro Abs: 3.5 10*3/uL (ref 1.7–7.7)
Platelets: 183 10*3/uL (ref 150–400)
RBC: 4.03 MIL/uL (ref 3.87–5.11)
RDW: 14.1 % (ref 11.5–15.5)
WBC: 5.9 10*3/uL (ref 4.0–10.5)

## 2017-02-16 MED ORDER — MOMETASONE FURO-FORMOTEROL FUM 200-5 MCG/ACT IN AERO
2.0000 | INHALATION_SPRAY | Freq: Two times a day (BID) | RESPIRATORY_TRACT | Status: DC
  Administered 2017-02-17 – 2017-02-18 (×3): 2 via RESPIRATORY_TRACT
  Filled 2017-02-16: qty 8.8

## 2017-02-16 MED ORDER — NITROGLYCERIN 0.4 MG SL SUBL: 0.4000 mg | SUBLINGUAL_TABLET | SUBLINGUAL | Status: DC | PRN

## 2017-02-16 MED ORDER — IPRATROPIUM-ALBUTEROL 0.5-2.5 (3) MG/3ML IN SOLN: 3.0000 mL | Freq: Four times a day (QID) | RESPIRATORY_TRACT | Status: DC | PRN

## 2017-02-16 MED ORDER — ONDANSETRON HCL 4 MG/2ML IJ SOLN
4.0000 mg | Freq: Once | INTRAMUSCULAR | Status: AC
  Administered 2017-02-16: 4 mg via INTRAVENOUS
  Filled 2017-02-16: qty 2

## 2017-02-16 MED ORDER — BENZONATATE 100 MG PO CAPS: 100.0000 mg | ORAL_CAPSULE | Freq: Three times a day (TID) | ORAL | Status: DC | PRN

## 2017-02-16 MED ORDER — PANTOPRAZOLE SODIUM 40 MG IV SOLR
40.0000 mg | INTRAVENOUS | Status: DC
  Administered 2017-02-17: 40 mg via INTRAVENOUS
  Filled 2017-02-16: qty 40

## 2017-02-16 MED ORDER — AZITHROMYCIN 500 MG IV SOLR
500.0000 mg | INTRAVENOUS | Status: DC
  Administered 2017-02-16 – 2017-02-17 (×2): 500 mg via INTRAVENOUS
  Filled 2017-02-16 (×2): qty 500

## 2017-02-16 MED ORDER — ALBUTEROL SULFATE (2.5 MG/3ML) 0.083% IN NEBU
2.5000 mg | INHALATION_SOLUTION | Freq: Four times a day (QID) | RESPIRATORY_TRACT | Status: DC
  Administered 2017-02-16 – 2017-02-17 (×5): 2.5 mg via RESPIRATORY_TRACT
  Filled 2017-02-16 (×5): qty 3

## 2017-02-16 MED ORDER — HEPARIN SODIUM (PORCINE) 5000 UNIT/ML IJ SOLN
5000.0000 [IU] | Freq: Three times a day (TID) | INTRAMUSCULAR | Status: DC
  Administered 2017-02-16 – 2017-02-18 (×6): 5000 [IU] via SUBCUTANEOUS
  Filled 2017-02-16 (×6): qty 1

## 2017-02-16 MED ORDER — SODIUM CHLORIDE 0.9 % IV SOLN
INTRAVENOUS | Status: DC
  Administered 2017-02-16 – 2017-02-17 (×2): via INTRAVENOUS

## 2017-02-16 MED ORDER — METHYLPREDNISOLONE SODIUM SUCC 125 MG IJ SOLR
60.0000 mg | Freq: Two times a day (BID) | INTRAMUSCULAR | Status: AC
  Administered 2017-02-17: 60 mg via INTRAVENOUS
  Filled 2017-02-16: qty 2

## 2017-02-16 MED ORDER — LOSARTAN POTASSIUM 50 MG PO TABS
50.0000 mg | ORAL_TABLET | Freq: Every day | ORAL | Status: DC
  Administered 2017-02-17 – 2017-02-18 (×2): 50 mg via ORAL
  Filled 2017-02-16 (×2): qty 1

## 2017-02-16 NOTE — Progress Notes (Signed)
PT BP 92/50. MD notified. Verbal order with read back for 500 mg IV azithromycin daily ordered.

## 2017-02-16 NOTE — H&P (Signed)
Referring Physician:  [name] is an 61 y.o. female.                       Chief Complaint: Shortness of breath, cough and fever  HPI: 61 year old female with PMH of hypertension, Alzheimer's disease and tobacco use disorder has 1 week history of shortness of breath, cough and fever. She had antibiotic treatment 3 weeks ago.   Past Medical History:  Diagnosis Date  . Alzheimer disease   . Hernia, abdominal   . Hypertension   . Normal cardiac stress test 2007      Past Surgical History:  Procedure Laterality Date  . ABDOMINAL HYSTERECTOMY    . CESAREAN SECTION    . CHOLECYSTECTOMY    . curretage for endometriosis  93, 94,95, 96  . GYN surgery    . HERNIA REPAIR    . OVARIAN CYST REMOVAL      Family History  Problem Relation Age of Onset  . Cancer Mother   . Stroke Mother   . COPD Mother   . Dementia Mother   . Diabetes Father   . Heart attack Father    Social History:  reports that she has been smoking cigarettes.  She has a 15.00 pack-year smoking history. She has quit using smokeless tobacco. She reports that she drinks alcohol. She reports that she uses drugs. Drug: Marijuana.  Allergies:  Allergies  Allergen Reactions  . Amoxicillin Hives and Other (See Comments)    Whelps Has patient had a PCN reaction causing immediate rash, facial/tongue/throat swelling, SOB or lightheadedness with hypotension: Yes welps Has patient had a PCN reaction causing severe rash involving mucus membranes or skin necrosis: No Has patient had a PCN reaction that required hospitalization No Has patient had a PCN reaction occurring within the last 10 years: Yes If all of the above answers are "NO", then may proceed with Cephalosporin use.   . Sulfonamide Derivatives Nausea And Vomiting    Medications Prior to Admission  Medication Sig Dispense Refill  . benzonatate (TESSALON) 100 MG capsule Take 1 capsule (100 mg total) 3 (three) times daily as needed by mouth for cough. 20  capsule 0  . chlorpheniramine-HYDROcodone (TUSSIONEX) 10-8 MG/5ML SUER Take 5 mLs every 12 (twelve) hours as needed by mouth for cough. 140 mL 0  . Ipratropium-Albuterol (COMBIVENT RESPIMAT) 20-100 MCG/ACT AERS respimat Inhale 1 puff every 6 (six) hours as needed into the lungs for wheezing or shortness of breath. 1 Inhaler 2  . levofloxacin (LEVAQUIN) 750 MG tablet Take 1 tablet (750 mg total) daily by mouth. 2 tablet 0  . losartan (COZAAR) 50 MG tablet Take 1 tablet (50 mg total) daily by mouth. 30 tablet 1  . mometasone-formoterol (DULERA) 200-5 MCG/ACT AERO Inhale 2 puffs 2 (two) times daily into the lungs. 1 Inhaler 2  . nitroGLYCERIN (NITROSTAT) 0.3 MG SL tablet Place 0.3 mg under the tongue every 5 (five) minutes as needed for chest pain (chest pain).     . predniSONE (DELTASONE) 10 MG tablet Take 5 tablet by mouth daily X 1 day; then 4 tablets by mouth daily X 2 days; then 3 tablets by mouth daily X 3 days; then 2 tablets by mouth daily X 2 days; then 1 tablet by mouth daily X 3 days and stop prednisone. 30 tablet 0    No results found for this or any previous visit (from the past 48 hour(s)). No results found.  Review Of Systems  Constitutional: No fever, chills, weight loss or gain. Eyes: No vision change, wears glasses. No discharge or pain. Ears: No hearing loss, No tinnitus. Respiratory: No asthma, Positive COPD, pneumonias, shortness of breath. No hemoptysis. Cardiovascular: Positive chest pain, palpitation, leg edema. Gastrointestinal: Positive nausea, vomiting, no diarrhea, constipation. No GI bleed. No hepatitis. Genitourinary: No dysuria, hematuria, kidney stone. No incontinance. Neurological: No headache, stroke, seizures.  Psychiatry: No psych facility admission for anxiety, depression, suicide. No detox. Positive memory loss. Skin: No rash. Musculoskeletal: No joint pain, fibromyalgia. No neck pain, back pain. Lymphadenopathy: No lymphadenopathy. Hematology: No anemia or  easy bruising.   Blood pressure (!) 104/56, pulse 100, temperature 98.6 F (37 C), temperature source Oral, resp. rate 20, height 5\' 2"  (1.575 m), weight 48.3 kg (106 lb 8 oz), SpO2 100 %. Body mass index is 19.48 kg/m. General appearance: alert, cooperative, appears stated age and no distress Head: Normocephalic, atraumatic. Eyes: Blue eyes, pink conjunctiva, corneas clear. PERRL, EOM's intact. Neck: No adenopathy, no carotid bruit, no JVD, supple, symmetrical, trachea midline and thyroid not enlarged. Resp: Rhonchi right lung more than left lung to auscultation. Cardio: Regular rate and rhythm, S1, S2 normal, II/VI systolic murmur, no click, rub or gallop GI: Soft, non-tender; bowel sounds normal; no organomegaly. Extremities: No edema, cyanosis or clubbing. Skin: Warm and dry.  Neurologic: Alert and oriented X 3, normal strength. Normal coordination and gait.  Assessment/Plan Acute exacerbation of COPD R/O pneumonia Hypertension Chronic memory loss Tobacco use disorder  Admit CXR. Albuterol nebulizer treatment/Oxygen/IV fluid. IV antibiotic post culture.  Ricki RodriguezAjay S Sallie Maker, MD  02/16/2017, 5:43 PM

## 2017-02-16 NOTE — Progress Notes (Signed)
Brittany [name] [id number]  Code Status: FULL  Admission Data: 02/16/2017 8:08 PM  Attending Provider: Kadakia  NWG:NFAOZHYPCP:Kadakia, Ajay, MD  Consults/ Treatment Team:   Brittany Pepperserry Hammar is a 61 y.o. female patient admitted directly from home today after doctor's appointment yesterday, alert - oriented X 3 - no acute distress noted. VSS - Blood pressure (!) 104/56, pulse 100, temperature 98.6 F (37 C), temperature source Oral, resp. rate 20, height 5\' 2"  (1.575 m), weight 48.3 kg (106 lb 8 oz), SpO2 99 %. no c/o shortness of breath, no c/o chest pain.  O2: @ 2 l/min per [place].  IV Fluids: IV in place, occlusive dsg intact without redness, IV cath.  Allergies:      Past Medical History:  Diagnosis Date  . Alzheimer disease   . Hernia, abdominal   . Hypertension   . Normal cardiac stress test 2007    Medications Prior to Admission  Medication Sig Dispense Refill  . benzonatate (TESSALON) 100 MG capsule Take 1 capsule (100 mg total) 3 (three) times daily as needed by mouth for cough. 20 capsule 0  . chlorpheniramine-HYDROcodone (TUSSIONEX) 10-8 MG/5ML SUER Take 5 mLs every 12 (twelve) hours as needed by mouth for cough. 140 mL 0  . Ipratropium-Albuterol (COMBIVENT RESPIMAT) 20-100 MCG/ACT AERS respimat Inhale 1 puff every 6 (six) hours as needed into the lungs for wheezing or shortness of breath. 1 Inhaler 2  . levofloxacin (LEVAQUIN) 750 MG tablet Take 1 tablet (750 mg total) daily by mouth. 2 tablet 0  . losartan (COZAAR) 50 MG tablet Take 1 tablet (50 mg total) daily by mouth. 30 tablet 1  . mometasone-formoterol (DULERA) 200-5 MCG/ACT AERO Inhale 2 puffs 2 (two) times daily into the lungs. 1 Inhaler 2  . nitroGLYCERIN (NITROSTAT) 0.3 MG SL tablet Place 0.3 mg under the tongue every 5 (five) minutes as needed for chest pain (chest pain).     . predniSONE (DELTASONE) 10 MG tablet Take 5 tablet by mouth daily X 1 day; then 4 tablets by mouth daily X 2 days; then 3 tablets by mouth daily X 3 days; then 2  tablets by mouth daily X 2 days; then 1 tablet by mouth daily X 3 days and stop prednisone. 30 tablet 0    Orientation to room, and floor completed with information packet given to patient/family. Patient declined safety video at this time. Admission INP armband ID verified with patient/family, and in place.  SR up x 2, fall assessment complete, with patient and family able to verbalize understanding of risk associated with falls, and verbalized understanding to call nsg before up out of bed. Call light within reach, patient able to voice, and demonstrate understanding. Skin, clean-dry- intact without evidence of bruising, or skin tears.  No evidence of skin break down noted on exam.  ?  Will cont to eval and treat per MD orders.  Jon GillsElisa R Jamaury Gumz, RN  02/16/2017 8:08 PM

## 2017-02-17 ENCOUNTER — Encounter (HOSPITAL_COMMUNITY): Payer: Self-pay

## 2017-02-17 ENCOUNTER — Other Ambulatory Visit: Payer: Self-pay

## 2017-02-17 DIAGNOSIS — J441 Chronic obstructive pulmonary disease with (acute) exacerbation: Secondary | ICD-10-CM | POA: Diagnosis not present

## 2017-02-17 LAB — BASIC METABOLIC PANEL
ANION GAP: 8 (ref 5–15)
BUN: 8 mg/dL (ref 6–20)
CHLORIDE: 104 mmol/L (ref 101–111)
CO2: 24 mmol/L (ref 22–32)
Calcium: 8.2 mg/dL — ABNORMAL LOW (ref 8.9–10.3)
Creatinine, Ser: 0.66 mg/dL (ref 0.44–1.00)
GFR calc non Af Amer: >60 mL/min (ref >60)
Glucose, Bld: 125 mg/dL — ABNORMAL HIGH (ref 65–99)
POTASSIUM: 2.7 mmol/L — AB (ref 3.5–5.1)
SODIUM: 136 mmol/L (ref 135–145)

## 2017-02-17 MED ORDER — NICOTINE 21 MG/24HR TD PT24
21.0000 mg | MEDICATED_PATCH | Freq: Every day | TRANSDERMAL | Status: DC
  Administered 2017-02-17 – 2017-02-18 (×2): 21 mg via TRANSDERMAL
  Filled 2017-02-17 (×2): qty 1

## 2017-02-17 MED ORDER — POTASSIUM CHLORIDE CRYS ER 20 MEQ PO TBCR
20.0000 meq | EXTENDED_RELEASE_TABLET | ORAL | Status: AC
  Administered 2017-02-17 (×4): 20 meq via ORAL
  Filled 2017-02-17 (×4): qty 1

## 2017-02-17 MED ORDER — ENSURE ENLIVE PO LIQD
237.0000 mL | Freq: Two times a day (BID) | ORAL | Status: DC
  Administered 2017-02-17 – 2017-02-18 (×4): 237 mL via ORAL

## 2017-02-17 NOTE — Progress Notes (Signed)
Visited with patient. She wanted to do living will and indicate DNR. She has family who do not agree but patient wants DNR. Competed Living Will and made official. Phebe CollaDonna S Cristalle Rohm, Lunette StandsChaplain    02/17/17 1000  Clinical Encounter Type  Visited With Patient  Visit Type Initial;Other (Comment) (wants AD)  Referral From Nurse  Consult/Referral To Chaplain  Spiritual Encounters  Spiritual Needs Sacred text;Prayer;Emotional;Other (Comment)  Stress Factors  Patient Stress Factors (AD DNR)  Family Stress Factors Health changes;Other (Comment) (patient wants DNR but family not in agreement)  Advance Directives (For Healthcare)  Does Patient Have a Medical Advance Directive? Yes

## 2017-02-17 NOTE — Progress Notes (Signed)
CRITICAL VALUE ALERT  Critical Value:  Potassium 2.7    Date & Time Notied:  02/17/17 0539   Provider Notified: Algie CofferKadakia  Orders Received/Actions taken: awaiting orders MD ordered PO Potassium 20meq q4 hrs x4 doses

## 2017-02-17 NOTE — Progress Notes (Signed)
Initial Nutrition Assessment  DOCUMENTATION CODES:   Non-severe (moderate) malnutrition in context of chronic illness  INTERVENTION:   Continue Ensure Enlive po BID, each supplement provides 350 kcal and 20 grams of protein  NUTRITION DIAGNOSIS:   Moderate Malnutrition related to chronic illness(alzheimer disease/difficulty chewing) as evidenced by mild muscle depletion, severe muscle depletion.  GOAL:   Patient will meet greater than or equal to 90% of their needs  MONITOR:   PO intake, Supplement acceptance, Weight trends, I & O's  REASON FOR ASSESSMENT:   Malnutrition Screening Tool    ASSESSMENT:   Pt with PMH of HTN, alzheimer disease presents with continued SOB and cough found acute bronchitis   Pt reports significant weight loss 5 years ago with a past UBW of 130 lbs. Pt said since then her UBW fluctuates but is typically ~106 lbs. Per chart, pt's weight seems to remain fairly stable.   Pt endorses decreased PO intake given fake teeth and available choices. Pt reports consuming small meals of 1-2 per day but often times skips meals. Pt reports she sometimes goes prolonged periods (multiple days) without eating. Pt reports she can only consume soft foods at this time. Per chart, no meal completions available for current admission.   Pt reports enjoying Ensure supplementation but states it is expensive. RD discussed secondary supplementation options that are more budget friendly. Promoted adequate calorie and protein needs, pt expressed understanding.  If pt's decreased PO intake continues, pt is at risk for more severe malnutrition. Pt with history of chronic memory loss unable to use food intake as criteria. No other historians at bedside.    Labs reviewed; K 2,7, Albumin 2.8 Medications reviewed; Solu-medrol, Protonix, potassium chloride  NUTRITION - FOCUSED PHYSICAL EXAM:    Most Recent Value  Orbital Region  No depletion  Upper Arm Region  Mild depletion   Thoracic and Lumbar Region  Mild depletion  Buccal Region  Mild depletion  Temple Region  Mild depletion  Clavicle Bone Region  Moderate depletion  Clavicle and Acromion Bone Region  Moderate depletion  Scapular Bone Region  Unable to assess  Dorsal Hand  Moderate depletion  Patellar Region  Severe depletion  Anterior Thigh Region  Severe depletion  Posterior Calf Region  Severe depletion  Edema (RD Assessment)  None       Diet Order:  Diet Heart Room service appropriate? Yes; Fluid consistency: Thin  EDUCATION NEEDS:   Education needs have been addressed  Skin:  Skin Assessment: Reviewed RN Assessment  Last BM:  02/16/17  Height:   Ht Readings from Last 1 Encounters:  02/16/17 5\' 2"  (1.575 m)    Weight:   Wt Readings from Last 1 Encounters:  02/16/17 106 lb 8 oz (48.3 kg)    Ideal Body Weight:  50 kg  BMI:  Body mass index is 19.48 kg/m.  Estimated Nutritional Needs:   Kcal:  1440-1680  Protein:  72-85 grams  Fluid:  >/= 1.4 L/d  Brittany KaufmannAllison Ioannides, MS, RDN, LDN 02/17/2017 2:02 PM

## 2017-02-18 DIAGNOSIS — J441 Chronic obstructive pulmonary disease with (acute) exacerbation: Secondary | ICD-10-CM | POA: Diagnosis not present

## 2017-02-18 LAB — BASIC METABOLIC PANEL WITH GFR
Anion gap: 9 (ref 5–15)
BUN: 11 mg/dL (ref 6–20)
CO2: 23 mmol/L (ref 22–32)
Calcium: 9 mg/dL (ref 8.9–10.3)
Chloride: 104 mmol/L (ref 101–111)
Creatinine, Ser: 0.67 mg/dL (ref 0.44–1.00)
GFR calc Af Amer: >60 mL/min
GFR calc non Af Amer: >60 mL/min
Glucose, Bld: 136 mg/dL — ABNORMAL HIGH (ref 65–99)
Potassium: 4.2 mmol/L (ref 3.5–5.1)
Sodium: 136 mmol/L (ref 135–145)

## 2017-02-18 MED ORDER — PREDNISONE 20 MG PO TABS
20.0000 mg | ORAL_TABLET | Freq: Every day | ORAL | Status: DC
  Administered 2017-02-18: 20 mg via ORAL
  Filled 2017-02-18: qty 1

## 2017-02-18 MED ORDER — ALBUTEROL SULFATE (2.5 MG/3ML) 0.083% IN NEBU: 2.5000 mg | INHALATION_SOLUTION | Freq: Four times a day (QID) | RESPIRATORY_TRACT | Status: DC | PRN

## 2017-02-18 MED ORDER — IPRATROPIUM-ALBUTEROL 0.5-2.5 (3) MG/3ML IN SOLN
3.0000 mL | Freq: Three times a day (TID) | RESPIRATORY_TRACT | Status: DC
  Administered 2017-02-18 (×2): 3 mL via RESPIRATORY_TRACT
  Filled 2017-02-18 (×2): qty 3

## 2017-02-18 MED ORDER — POTASSIUM CHLORIDE ER 10 MEQ PO TBCR: 10.0000 meq | EXTENDED_RELEASE_TABLET | ORAL | 3 refills | Status: DC

## 2017-02-18 MED ORDER — NICOTINE 21 MG/24HR TD PT24: 21.0000 mg | MEDICATED_PATCH | Freq: Every day | TRANSDERMAL | 0 refills | Status: DC

## 2017-02-18 MED ORDER — ENSURE ENLIVE PO LIQD: 237.0000 mL | Freq: Two times a day (BID) | ORAL | 6 refills | Status: DC

## 2017-02-18 MED ORDER — NITROGLYCERIN 0.4 MG SL SUBL: 0.4000 mg | SUBLINGUAL_TABLET | SUBLINGUAL | 1 refills | Status: DC | PRN

## 2017-02-18 MED ORDER — AZITHROMYCIN 500 MG PO TABS: 500.0000 mg | ORAL_TABLET | Freq: Every day | ORAL | 0 refills | Status: DC

## 2017-02-18 MED ORDER — AZITHROMYCIN 500 MG PO TABS: 500.0000 mg | ORAL_TABLET | Freq: Every day | ORAL | Status: DC

## 2017-02-18 MED ORDER — POTASSIUM CHLORIDE ER 10 MEQ PO TBCR: 10.0000 meq | EXTENDED_RELEASE_TABLET | ORAL | Status: DC

## 2017-02-18 MED ORDER — PANTOPRAZOLE SODIUM 40 MG PO TBEC
40.0000 mg | DELAYED_RELEASE_TABLET | Freq: Every day | ORAL | Status: DC
  Administered 2017-02-18: 40 mg via ORAL
  Filled 2017-02-18: qty 1

## 2017-02-18 MED ORDER — PREDNISONE 5 MG PO TABS: 20.0000 mg | ORAL_TABLET | Freq: Every day | ORAL | 0 refills | Status: DC

## 2017-02-18 MED ORDER — PANTOPRAZOLE SODIUM 40 MG PO TBEC: 40.0000 mg | DELAYED_RELEASE_TABLET | Freq: Every day | ORAL | 3 refills | Status: DC

## 2017-02-18 NOTE — Care Management CC44 (Signed)
Condition Code 44 Documentation Completed  Patient Details  Name: Brittany [name] MRN: [id number] Date of Birth: 02/10/1956   Condition Code 44 given:  Yes Patient signature on Condition Code 44 notice:  Yes Documentation of 2 MD's agreement:  Yes Code 44 added to claim:  Yes    Brittany Sabalebbie Tajuan Dufault, RN 02/18/2017, 2:25 PM

## 2017-02-18 NOTE — Care Management Obs Status (Signed)
MEDICARE OBSERVATION STATUS NOTIFICATION   Patient Details  Name: Brittany [name] MRN: [id number] Date of Birth: 17-Jul-1955   Medicare Observation Status Notification Given:  Yes    Lawerance Sabalebbie Charnee Turnipseed, RN 02/18/2017, 2:25 PM

## 2017-02-18 NOTE — Discharge Summary (Signed)
Physician Discharge Summary  Patient ID: Brittany [name] MRN: [id number] DOB/AGE: 59-Feb-1957 59 y.o.  Admit date: 02/16/2017 Discharge date: 02/18/2017  Admission Diagnoses: Acute exacerbation of COPD R/O Pneumonia Hypertension Chronic memory loss Tobacco use disorder  Discharge Diagnoses:  Principal Problem:   Acute exacerbation of COPD Active Problems:   Benign essential HTN   Malnutrition of moderate degree (HCC)   Tobacco use disorder   Nausea/Vomiting   Dehydration   Sinus tachycardia from above   Generalized weakness   Hypokalemia    Chronic memory loss  Discharged Condition: fair  Hospital Course: 61 year old female with past medical history of hypertension on exam is disease and tobacco use disorder and a 1 week history of shortness of breath cough and fever. She had antibiotic treatment 3 weeks ago for similar condition.  Patient appeared sick and was weak from decreased oral intake for a few days as she also had associated nausea vomiting. Her condition improved with use of azithromycin, IV Solu-Medrol and IV Protonix.  She was also given nicotine patch to fight tobacco use disorder. She was discharged home in stable condition with follow-up by me in 1 week.  Consults: cardiology  Significant Diagnostic Studies: labs: Normal CBC compared to elevated WBC counts 2 weeks ago. Hypokalemia on BMET improves with potassium supplementation and IV fluids.  Chest x-ray was suggestive of mild edema v/s bronchitis with resolution of prior right mid and upper lobe opacities.  EKG showed sinus tachycardia.  Treatments: antibiotics: azithromycin and IV fluids. Tapering dose of prednisone.  Discharge Exam: Blood pressure 113/61, pulse (!) 106, temperature 99.1 F (37.3 C), temperature source Oral, resp. rate 18, height 5\' 2"  (1.575 m), weight 49.9 kg (109 lb 14.4 oz), SpO2 99 %. General appearance: alert, cooperative and appears stated age. Head: Normocephalic,  atraumatic. Eyes: Blue eyes, pink conjunctiva, corneas clear. PERRL, EOM's intact.  Neck: No adenopathy, no carotid bruit, no JVD, supple, symmetrical, trachea midline and thyroid not enlarged. Resp: Clearing to auscultation bilaterally. Cardio: Regular rate and rhythm, S1, S2 normal, II/VI systolic murmur, no click, rub or gallop. GI: Soft, non-tender; bowel sounds normal; no organomegaly. Extremities: No edema, cyanosis or clubbing. Skin: Warm and dry.  Neurologic: Alert and oriented X 3, normal strength and tone. Slow gait.  Disposition: 01-Home or Self Care   Allergies as of 02/18/2017      Reactions   Amoxicillin Hives, Other (See Comments)   Whelps Has patient had a PCN reaction causing immediate rash, facial/tongue/throat swelling, SOB or lightheadedness with hypotension: Yes welps Has patient had a PCN reaction causing severe rash involving mucus membranes or skin necrosis: No Has patient had a PCN reaction that required hospitalization No Has patient had a PCN reaction occurring within the last 10 years: Yes If all of the above answers are "NO", then may proceed with Cephalosporin use.   Sulfonamide Derivatives Nausea And Vomiting      Medication List    STOP taking these medications   chlorpheniramine-HYDROcodone 10-8 MG/5ML Suer Commonly known as:  TUSSIONEX   Ipratropium-Albuterol 20-100 MCG/ACT Aers respimat Commonly known as:  COMBIVENT RESPIMAT   levofloxacin 750 MG tablet Commonly known as:  LEVAQUIN   mometasone-formoterol 200-5 MCG/ACT Aero Commonly known as:  DULERA     TAKE these medications   albuterol 108 (90 Base) MCG/ACT inhaler Commonly known as:  PROVENTIL HFA;VENTOLIN HFA Inhale 2 puffs into the lungs every 6 (six) hours as needed for wheezing or shortness of breath.   azithromycin 500 MG tablet  Commonly known as:  ZITHROMAX Take 1 tablet (500 mg total) by mouth daily.   B-complex with vitamin C tablet Take 1 tablet by mouth daily.    benzonatate 100 MG capsule Commonly known as:  TESSALON Take 1 capsule (100 mg total) 3 (three) times daily as needed by mouth for cough.   budesonide-formoterol 160-4.5 MCG/ACT inhaler Commonly known as:  SYMBICORT Inhale 2 puffs into the lungs 2 (two) times daily.   feeding supplement (ENSURE ENLIVE) Liqd Take 237 mLs by mouth 2 (two) times daily between meals. Start taking on:  02/19/2017   losartan 50 MG tablet Commonly known as:  COZAAR Take 1 tablet (50 mg total) daily by mouth.   nicotine 21 mg/24hr patch Commonly known as:  NICODERM CQ - dosed in mg/24 hours Place 1 patch (21 mg total) onto the skin daily.   nitroGLYCERIN 0.4 MG SL tablet Commonly known as:  NITROSTAT Place 1 tablet (0.4 mg total) under the tongue every 5 (five) minutes x 3 doses as needed for chest pain (chest pain). What changed:    medication strength  how much to take  when to take this   pantoprazole 40 MG tablet Commonly known as:  PROTONIX Take 1 tablet (40 mg total) by mouth daily. Start taking on:  02/19/2017   potassium chloride 10 MEQ tablet Commonly known as:  K-DUR Take 1 tablet (10 mEq total) by mouth every Monday, Wednesday, and Friday. Start taking on:  02/20/2017   predniSONE 5 MG tablet Commonly known as:  DELTASONE Take 4 tablets (20 mg total) by mouth daily. X 2 days then 3 tablet (15 mg) daily x 2 days, then 2 tablet (10 mg.) Daily x 1 week, then 1 tablet (5 mg.) Daily x 2 weeks. Start taking on:  02/19/2017 What changed:    medication strength  how much to take  how to take this  when to take this  additional instructions      Follow-up Information    Orpah CobbKadakia, Nefertari Rebman, MD. Schedule an appointment as soon as possible for a visit in 1 week(s).   Specialty:  Cardiology Contact information: [address] [place] [address] [phone]           Signed: Ricki Rodriguezjay S Nesiah Jump 02/18/2017, 4:06 PM

## 2017-02-21 ENCOUNTER — Telehealth: Payer: Self-pay

## 2017-02-21 NOTE — Telephone Encounter (Signed)
I called patient to see if she could drop off her new patient packet today for her appointment to establish care tomorrow.   Pt mailbox is full and I could not leave a message.

## 2017-02-21 NOTE — Telephone Encounter (Signed)
I spoke with patient and she asked to change her new pt appointment to later in the month. Pt rescheduled to 03/08/17. Pt advised to return patient packet atleast 2 days before appt. She verbalized understanding.

## 2017-02-22 ENCOUNTER — Ambulatory Visit: Payer: Self-pay | Admitting: Internal Medicine

## 2017-02-24 ENCOUNTER — Institutional Professional Consult (permissible substitution): Payer: Medicare HMO | Admitting: Pulmonary Disease

## 2017-03-01 NOTE — Congregational Nurse Program (Signed)
Congregational Nurse Program Note  Date of Encounter: 03/01/2017  Past Medical History: Past Medical History:  Diagnosis Date  . Alzheimer disease   . Hernia, abdominal   . Hypertension   . Normal cardiac stress test 2007    Encounter Details: CNP Questionnaire - 03/01/17 1853      Questionnaire   Patient Status  Not Applicable    Race  White or Caucasian    Location Patient Served At  Not Applicable    Insurance  Medicaid    Uninsured  Not Applicable    Food  No food insecurities    Housing/Utilities  No permanent housing    Transportation  Yes, need transportation assistance;Provided transportation assistance (bus pass, taxi voucher, etc.)    Interpersonal Safety  Yes, feel physically and emotionally safe where you currently live    Medication  Yes, have medication insecurities;Provided medication assistance    Medical Provider  Yes    Referrals  Primary Care Provider/Clinic    ED Visit Averted  Not Applicable    Life-Saving Intervention Made  Not Applicable     Initial visit by [facility] team today found client settling into her room ,feeling safe and secure and not as anxious about her previous  living situation as the previous living environment was really making her feel  anxious with some  Threats on her life  and burdens of responsibility for others. She ,her dog and her daughter settling in  [facility] had [facility] her O[facility] and set up was complete but needed them to return and connect humidifier  to ease her nose bleeds from using 02 as it  dries her nostrils out,client was to make this priority to call today to have [place] come back out as soon as possible.  Client has had pneumonia twice this year and it was then that she had to go on 02 almost 24 hours ,can be off of it about 20-30 minutes but doesn't push it as she starts to get anxious. Likes for her daughter to live with her but understand she works and needs a life of her own as she is 61 years of age  and cant be with her always. Feels better at night if something happens if her daughter is with her . Counseled regarding possibly getting and medical alert that if she got into trouble she could immediately push it and help would be on the way ,may consider that . Client taking her medications and and SW to complete [place] [facility] application . Her sister Caryl Asp has been very helpful and assisted with her move ,.Caryl Asp will take her to appointments for now but we will work to get her [employer] transportation..  Client will also apply for [employer] transportation. Wants to get a lighter portable 02 machine ,[facility] in the process of updating vendor so this can occur.. Her car repairs ,working on several avenues to get those done through a  church member  . Food supply fine .eating well ,drinks several ensures per day loves them ,they make her feel good.To apply for long term medicaid at  [facility] office will go next week ,Housing list given to her by SW and client to make some calls . Move was good but she feels tired and was counseled regarding taking it easy and pacing her activities in order to not get too anxious and cause her breathing problems .trying to work on not becoming too stressed Mariana Arn anxious . Client get  Disability check and has been disabled about 18 years , $1198 starting in January ,would like a 2 bedroom  Apartment near her church ,first floor but would take a one bedroom because she knows her daughter wants to live in [place] . Daughter works at  [employer] in [place] and has friends there. Goals  Call [facility] get 02 needs met  Application for [place] Med assist  Housing options ,work on list given to her [employer]  Make contact /SHIP Help with nicotine patches call 1-800 line  Consider Med alert  System /button  Keep appointments ,sister will transport her for now  Team will be supportive and identify resources to help her transition to permanent housing smoothly  Her medical team is : Dr Vinnie Langton   --cardiologist  [facility],  Pulmonary Care  Maryanna Shape Dr.  Vaughan Browner Client was last hospitalized  02-16-17-02-18-17 Client states she has had 15 surgeries in her life time Continues to smoke very little trying hard to wean ,understands seriousness of smoking and her health ,needs to identify triggers that makes her want to smoke ,feels this environment will help her to wean off and wants to .  States she weighs about 112 lbs now  Next visit by team 03-07-17@ 11 am

## 2017-03-03 ENCOUNTER — Telehealth: Payer: Self-pay

## 2017-03-03 NOTE — Telephone Encounter (Signed)
I attempted to call patient to ask that she bring in the new patient packet by Monday, 03/06/17, afternoon. Pt is scheduled for a new patient appointment on 03/08/17.   Voicemail box was full and I could not leave a message.

## 2017-03-07 ENCOUNTER — Telehealth: Payer: Self-pay

## 2017-03-07 NOTE — Congregational Nurse Program (Signed)
Congregational Nurse Program Note  Date of Encounter: 03/07/2017  Past Medical History: Past Medical History:  Diagnosis Date  . Alzheimer disease   . Hernia, abdominal   . Hypertension   . Normal cardiac stress test 2007    Encounter Details: CNP Questionnaire - 03/07/17 1813      Questionnaire   Location Patient Served At  [facility] patient     Joint visit by [facility] team,client in her room with her dog ,daughter not there today ,works in [place] and stays there some. Client has appointment with PCP tomorrow [facility] ,trying to get initial viist paper work completed,Sw helping with this and [place] Med assist application ,completed SCAT paper work . Client states last  Thursday she became very anxious ,had a very unusual chest pain ,no radiating anywhere else ,stayed with her for a while ,had her )2 going ,took her nitro pills ,waited took another ,ate small pieces of bread ? Finally got some relief ,pain hasn't returned but states hadn't felt like that before,we talked and if client experiences that again with no relief call 911 ,talked about  Various signs and symptoms . Client states she was relaxing watching TV when pain started ,hadnot being smoking,no anxiety or stress that she can remember that triggered symptoms. Still having problems with  02 called but they didn't help states she is missing a cord? Must find cord? Sister to help look for cord ,lost or misplaced during move. Having problems with home fill . ,wants a loght weight machine. Wants to go to church ,right now  Transportation is a problem has to depend on her sister to take her everywhere. Hopefully the team can get her SCAT transportation ,application completed today and will be faxed in . Will be alone Xmas she states ,not sure if daughter will be there? Suggested we look at a support group for [facility] ,team will look into this . Housing  Client is okay to go as high as $600 for rent ,gets $130.00 in food stamps . Has readily  available food she states and sister will take her to the store tomorrow. Sw working to connect with Quit line for assistance with patches,, blocked calls from previous roommates because calls were causing some anxiety. Granddaughter to visit which made her happy for Christmas. Client nesds support ,somewhat lonely but manages well,gets very anxious about her health and not sure of relationship with daughter as nurse called client later after visit to get assistance with calling [facility] regarding 02 equipment and she states daughter probably wont be back until tomorrow ,so client is alone a lot. Client to follow up with application at DSS to get medicare . Next visit by team 03-13-17 @ 10 am .

## 2017-03-07 NOTE — Telephone Encounter (Signed)
Nurse called [facility] regarding concerns with equipment and set up as client has no humidifier and continues to have some nose bleeds ,needs that connection and having problems with home fills . [facility] needs to talk with client while she is standing near machine in order to determine what to send out,will work to correct concerns . Nurse called client and ask her to call [facility]  now and they will try to determine her needs and make adjustments. Client states she will call now !!!

## 2017-03-08 ENCOUNTER — Telehealth: Payer: Self-pay

## 2017-03-08 ENCOUNTER — Ambulatory Visit (INDEPENDENT_AMBULATORY_CARE_PROVIDER_SITE_OTHER): Payer: Medicare HMO | Admitting: Internal Medicine

## 2017-03-08 ENCOUNTER — Encounter: Payer: Self-pay | Admitting: Internal Medicine

## 2017-03-08 VITALS — BP 134/90 | HR 92 | Temp 97.6°F | Ht 62.0 in | Wt 111.8 lb

## 2017-03-08 DIAGNOSIS — F322 Major depressive disorder, single episode, severe without psychotic features: Secondary | ICD-10-CM | POA: Diagnosis not present

## 2017-03-08 DIAGNOSIS — R945 Abnormal results of liver function studies: Secondary | ICD-10-CM

## 2017-03-08 DIAGNOSIS — I10 Essential (primary) hypertension: Secondary | ICD-10-CM | POA: Diagnosis not present

## 2017-03-08 DIAGNOSIS — E440 Moderate protein-calorie malnutrition: Secondary | ICD-10-CM

## 2017-03-08 DIAGNOSIS — F411 Generalized anxiety disorder: Secondary | ICD-10-CM | POA: Diagnosis not present

## 2017-03-08 DIAGNOSIS — J42 Unspecified chronic bronchitis: Secondary | ICD-10-CM

## 2017-03-08 DIAGNOSIS — R7989 Other specified abnormal findings of blood chemistry: Secondary | ICD-10-CM

## 2017-03-08 DIAGNOSIS — E876 Hypokalemia: Secondary | ICD-10-CM | POA: Diagnosis not present

## 2017-03-08 DIAGNOSIS — J449 Chronic obstructive pulmonary disease, unspecified: Secondary | ICD-10-CM | POA: Insufficient documentation

## 2017-03-08 DIAGNOSIS — R160 Hepatomegaly, not elsewhere classified: Secondary | ICD-10-CM

## 2017-03-08 LAB — COMPLETE METABOLIC PANEL WITH GFR
AG Ratio: 1.7 (calc) (ref 1.0–2.5)
ALKALINE PHOSPHATASE (APISO): 164 U/L — AB (ref 33–130)
ALT: 335 U/L — AB (ref 6–29)
AST: 221 U/L — AB (ref 10–35)
Albumin: 4 g/dL (ref 3.6–5.1)
BUN: 15 mg/dL (ref 7–25)
CALCIUM: 9.4 mg/dL (ref 8.6–10.4)
CO2: 27 mmol/L (ref 20–32)
CREATININE: 0.6 mg/dL (ref 0.50–1.05)
Chloride: 101 mmol/L (ref 98–110)
GFR, EST NON AFRICAN AMERICAN: 100 mL/min/{1.73_m2} (ref >=60)
GFR, Est African American: 116 mL/min/{1.73_m2} (ref >=60)
Globulin: 2.4 g/dL (calc) (ref 1.9–3.7)
Glucose, Bld: 83 mg/dL (ref 65–139)
POTASSIUM: 4.3 mmol/L (ref 3.5–5.3)
Sodium: 137 mmol/L (ref 135–146)
Total Bilirubin: 1 mg/dL (ref 0.2–1.2)
Total Protein: 6.4 g/dL (ref 6.1–8.1)

## 2017-03-08 LAB — BILIRUBIN, FRACTIONATED(TOT/DIR/INDIR)
BILIRUBIN DIRECT: 0.4 mg/dL — AB (ref 0.0–0.2)
BILIRUBIN INDIRECT: 0.6 mg/dL (ref 0.2–1.2)
BILIRUBIN TOTAL: 1 mg/dL (ref 0.2–1.2)

## 2017-03-08 LAB — GAMMA GT: GGT: 325 U/L — ABNORMAL HIGH (ref 3–70)

## 2017-03-08 NOTE — Progress Notes (Signed)
Patient ID: Leola Fiore, female   DOB: April 04, 1955, 61 y.o.   MRN: [id number]   Location:  [facility] OFFICE  Provider: DR Arletha Grippe   Goals of Care:  Advanced Directives 02/17/2017  Does Patient Have a Medical Advance Directive? Yes  Would patient like information on creating a medical advance directive? -     Chief Complaint  Patient presents with  . Establish Care    New Patient to establish care    HPI: Patient is a 61 y.o. female seen today as a new pt. She was hospitalized x 2 in month of November for COPD exacerbation and CAP. Labs revealed ALT 1330; AST 392; TBili 4.8; alk phos 162. She has trouble with [place] O2 tanks and supplies. She stopped taking prednisone due to increased anxiety. She has increased anxiety and feels anxious most times of the day. She is currently living in a motel and does not have reliable transportation to get to appts. She feels depressed. No SI/HI. she has [place] Therapist, sports and SW. PHQ9 score 23; GAD score 13 by SW. She is trying to get SCAT and assistance with cost of inhalers. She is still smoking.    COPD/chronic tobacco abuse - she is scheduled to f/u next month at [facility] pulmonary. She takes albuterol HFA, symbicort. She plans to use nicotine patch to help her stop smoking.  HTN - followed by cardio Dr Doylene Canard. She had labs drawn recently but does not recall what was checked. She takes losartan and prn SLNTG  Hypokalemia - stable on K dur  GERD - stable on protonix   Malnutrition - albumin 2.8. She has been using strawberry flavored ensure  Dementia - alzheimer's; worse with increased anxiety  Past Medical History:  Diagnosis Date  . Alzheimer disease   . Bilateral pneumonia 01/2017   per new patient packet   . COPD (chronic obstructive pulmonary disease) ([place]) 01/2017   per new patient packet   . Gallbladder disease 2014   per new patient packet   . Hernia, abdominal   . Hypertension   . Liver damage 01/2017   per new patient packet, Dr.Kadakia   . Normal cardiac stress test 2007  . Ovarian cyst 1977   8 1/2 lb left ovarian cyst, Dr.Cox, per new patient packet   . Right ovarian cyst    1992, 1993, 1994, 1995, and 1996 Dr.Neil, per new patient packet     Past Surgical History:  Procedure Laterality Date  . ABDOMINAL HYSTERECTOMY Bilateral    Total  . CESAREAN SECTION    . CHOLECYSTECTOMY    . curretage for endometriosis  93, 94,95, 96  . GYN surgery    . HERNIA REPAIR    . OVARIAN CYST REMOVAL       reports that she has been smoking cigarettes.  She has a 20.00 pack-year smoking history. She has quit using smokeless tobacco. She reports that she uses drugs. Drug: Marijuana. She reports that she does not drink alcohol. Social History   Socioeconomic History  . Marital status: Divorced    Spouse name: Not on file  . Number of children: Not on file  . Years of education: Not on file  . Highest education level: Not on file  Social Needs  . Financial resource strain: Not on file  . Food insecurity - worry: Not on file  . Food insecurity - inability: Not on file  . Transportation needs - medical: Not on file  . Transportation needs - non-medical:  Not on file  Occupational History  . Not on file  Tobacco Use  . Smoking status: Current Every Day Smoker    Packs/day: 0.50    Years: 40.00    Pack years: 20.00    Types: Cigarettes  . Smokeless tobacco: Former Systems developer  . Tobacco comment: Takes 2-3 puffs when smoking, 2-3 packs per day   Substance and Sexual Activity  . Alcohol use: No    Frequency: Never  . Drug use: Yes    Types: Marijuana    Comment: occasionally  . Sexual activity: No    Comment: disabled, `1 yr college, married.   Other Topics Concern  . 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 No   Do you have difficulty preparing food or eating? No   Do you have difficulty managing your medications? No   Do you have difficulty managing your finances? No   Do you have difficulty affording your medications? Yes Inhalers     Family History  Problem Relation Age of Onset  . Cancer Mother   . Stroke Mother   . COPD Mother   . Dementia Mother   . Diabetes Father   . Heart attack Father   . Diabetes Sister   . Stroke Sister        x2  . Hepatitis C Daughter     Allergies  Allergen Reactions  . Amoxicillin Hives and Other (See Comments)    [place] Has patient had a PCN reaction causing immediate rash, facial/tongue/throat swelling, SOB or lightheadedness with hypotension: Yes welps Has patient had a PCN reaction causing severe rash involving mucus membranes or skin necrosis:  Has patient had a PCN reaction that required hospitalization No Has patient had a PCN reaction occurring within the last 10 years: Yes If all of the above answers are "NO", then may proceed with Cephalosporin use.   . Sulfonamide Derivatives Nausea And Vomiting    Outpatient Encounter Medications as of 03/08/2017  Medication Sig  . albuterol (PROVENTIL HFA;VENTOLIN HFA) 108 (90 Base) MCG/ACT inhaler Inhale 2 puffs into the lungs every 6 (six) hours as needed for wheezing or shortness of breath.  . B Complex-C (B-COMPLEX WITH VITAMIN C) tablet Take 1 tablet by mouth daily.  . budesonide-formoterol (SYMBICORT) 160-4.5 MCG/ACT inhaler Inhale 12 puffs into the lungs daily.   . feeding supplement, ENSURE ENLIVE, (ENSURE ENLIVE) LIQD Take 237 mLs by mouth 2 (two) times daily between meals.  Marland Kitchen losartan (COZAAR) 50 MG tablet Take 1 tablet (50 mg total)  daily by mouth.  . nicotine (NICODERM CQ - DOSED IN MG/24 HOURS) 21 mg/24hr patch Place 1 patch (21 mg total) onto the skin daily.  . nitroGLYCERIN (NITROSTAT) 0.4 MG SL tablet Place 1 tablet (0.4 mg total) under the tongue every 5 (five) minutes x 3  doses as needed for chest pain (chest pain).  . pantoprazole (PROTONIX) 40 MG tablet Take 1 tablet (40 mg total) by mouth daily.  . potassium chloride (K-DUR) 10 MEQ tablet Take 1 tablet (10 mEq total) by mouth every Monday, Wednesday, and Friday.  . predniSONE (DELTASONE) 5 MG tablet Take 4 tablets (20 mg total) by mouth daily. X 2 days then 3 tablet (15 mg) daily x 2 days, then 2 tablet (10 mg.) Daily x 1 week, then 1 tablet (5 mg.) Daily x 2 weeks.  . [DISCONTINUED] azithromycin (ZITHROMAX) 500 MG tablet Take 1 tablet (500 mg total) by mouth daily.  . [DISCONTINUED] benzonatate (TESSALON) 100 MG capsule Take 1 capsule (100 mg total) 3 (three) times daily as needed by mouth for cough. (Patient not taking: Reported on 02/17/2017)   No facility-administered encounter medications on file as of 03/08/2017.     Review of Systems:  Review of Systems  HENT: Positive for dental problem (dentures).        Nosebleeds  Respiratory: Positive for cough.   Psychiatric/Behavioral: The patient is nervous/anxious.   All other systems reviewed and are negative.   Health Maintenance  Topic Date Due  . Hepatitis C Screening  1956-02-14  . TETANUS/TDAP  02/17/1977  . PAP SMEAR  02/18/1979  . COLONOSCOPY  02/18/2008  . MAMMOGRAM  06/08/2015  . INFLUENZA VACCINE  Completed  . HIV Screening  Completed    Physical Exam: Vitals:   03/08/17 1114  BP: 134/90  Pulse: 92  Temp: 97.6 F (36.4 C)  TempSrc: Oral  SpO2: 96%  Weight: 111 lb 12.8 oz (50.7 kg)  Height: 5' 2"  (1.575 m)   Body mass index is 20.45 kg/m. Physical Exam  Constitutional: She appears well-developed.  Frail appearing in NAD  HENT:  Mouth/Throat: Oropharynx is clear and moist. No  oropharyngeal exudate.  MMM; no oral thrush  Eyes: Pupils are equal, round, and reactive to light. No scleral icterus.  Neck: Neck supple. Carotid bruit is not present. No tracheal deviation present. No thyromegaly present.  Cardiovascular: Normal rate, regular rhythm and intact distal pulses. Exam reveals no gallop and no friction rub.  Murmur (1/6 SEM) heard. No LE edema b/l. no calf TTP.   Pulmonary/Chest: Effort normal and breath sounds normal. No stridor. No respiratory distress. She has no wheezes. She has no rales.  Reduced BS at base b/l.   Abdominal: Soft. Normal appearance and bowel sounds are normal. She exhibits no distension and no mass. There is hepatomegaly. There is tenderness. There is no rigidity, no rebound and no guarding. No hernia.  Lymphadenopathy:    She has no cervical adenopathy.  Neurological: She is alert.  Skin: Skin is warm and dry. No rash noted.  Psychiatric: She has a normal mood and affect. Her behavior is normal. Thought content normal.    Labs reviewed: Basic Metabolic Panel: Recent Labs    01/29/17 0515 01/30/17 0450  02/01/17 0437 02/16/17 1829 02/17/17 0313 02/18/17 0845  NA 143 142   < > 138 134* 136 136  K 2.7* 5.3*   < > 4.6 2.8* 2.7* 4.2  CL 106 110   < > 105 103 104 104  CO2 22 22   < > 25 21* 24 23  GLUCOSE 183* 191*   < > 181* 109* 125* 136*  BUN 24* 45*   < > 49* 10 8 11   CREATININE 0.75 0.78   < > 0.73 0.66 0.66 0.67  CALCIUM 9.1  9.3   < > 8.7* 8.3* 8.2* 9.0  MG 1.9 2.3  --  2.4  --   --   --    < > = values in this interval not displayed.   Liver Function Tests: Recent Labs    11/28/16 1434 01/28/17 0514 02/16/17 1829  AST 61* 15 392*  ALT 73* 14 1,330*  ALKPHOS 92 56 162*  BILITOT 1.4* 0.5 4.8*  PROT 6.1* 6.2* 5.4*  ALBUMIN 3.5 3.1* 2.8*   No results for input(s): LIPASE, AMYLASE in the last 8760 hours. No results for input(s): AMMONIA in the last 8760 hours. CBC: Recent Labs    01/31/17 0418 02/01/17 0437  02/16/17 1829  WBC 17.2* 23.8* 5.9  NEUTROABS  --   --  3.5  HGB 15.6* 14.6 12.0  HCT 45.4 43.2 35.0*  MCV 88.8 89.4 86.8  PLT 322 271 183   Lipid Panel: No results for input(s): CHOL, HDL, LDLCALC, TRIG, CHOLHDL, LDLDIRECT in the last 8760 hours. No results found for: HGBA1C  Procedures since last visit: X-ray Chest Pa And Lateral  Result Date: 02/16/2017 CLINICAL DATA:  61 y/o F; chest pain, productive cough, nausea, shortness of breath, and recent pneumonia. EXAM: CHEST  2 VIEW COMPARISON:  01/28/2017 chest radiograph. FINDINGS: Hazy opacities in right mid and upper lung zones are resolved. No new consolidation. Stable normal cardiac silhouette. Aortic atherosclerosis. Mild reticular markings of lungs may represent interstitial edema or bronchitic changes. Mild lower thoracic spine dextrocurvature. IMPRESSION: 1. Mild reticular markings of lungs may represent interstitial edema or bronchitic changes. No consolidation. 2. Resolved opacities in right mid and bilateral upper lung zones. Electronically Signed   By: Kristine Garbe M.D.   On: 02/16/2017 21:02    Assessment/Plan   ICD-10-CM   1. Chronic bronchitis, unspecified chronic bronchitis type ([place]) J42   2. Elevated LFTs R94.5 CMP with eGFR    Bilirubin, fractionated(tot/dir/indir)    Gamma GT    US Abdomen Limited RUQ  3. Current severe episode of major depressive disorder without psychotic features, unspecified whether recurrent ([place]) F32.2   4. GAD (generalized anxiety disorder) F41.1   5. Hypokalemia E87.6   6. Malnutrition of moderate degree (HCC) E44.0   7. Benign essential HTN I10   8. Hepatomegaly R16.0 CMP with eGFR    Gamma GT    US Abdomen Limited RUQ    Cont current meds - will start mood med once liver function resulted (t/c citalopram, effexor or cymbalta)  Will call with [place] appt and lab results  Follow up with specialists as scheduled  Get old records from Dr Doylene Canard  Follow up in 1 month for  AWV/CPE.   Aviyana Sonntag S. Perlie Gold  [facility] [address] [place], [place] [address] [phone] Cell (Monday-Friday 8 AM - 5 PM) [phone] After 5 PM and follow prompts

## 2017-03-08 NOTE — Telephone Encounter (Signed)
GAD score 13 PHQ9 score 23

## 2017-03-08 NOTE — Patient Instructions (Signed)
Will call with US appt and lab results  Follow up with specialists as scheduled  Get old records from Dr Algie CofferKadakia  Follow up in 1 month for AWV/CPE.

## 2017-03-17 ENCOUNTER — Ambulatory Visit: Payer: Self-pay | Admitting: Internal Medicine

## 2017-03-20 ENCOUNTER — Other Ambulatory Visit: Payer: Self-pay | Admitting: Internal Medicine

## 2017-03-20 DIAGNOSIS — R748 Abnormal levels of other serum enzymes: Secondary | ICD-10-CM
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
## 2017-04-04 ENCOUNTER — Ambulatory Visit (INDEPENDENT_AMBULATORY_CARE_PROVIDER_SITE_OTHER): Payer: Medicare HMO | Admitting: Pulmonary Disease

## 2017-04-04 ENCOUNTER — Encounter: Payer: Self-pay | Admitting: Pulmonary Disease

## 2017-04-04 VITALS — BP 112/62 | HR 103 | Ht 62.0 in | Wt 118.2 lb

## 2017-04-04 DIAGNOSIS — J449 Chronic obstructive pulmonary disease, unspecified: Secondary | ICD-10-CM

## 2017-04-04 DIAGNOSIS — F17200 Nicotine dependence, unspecified, uncomplicated: Secondary | ICD-10-CM | POA: Diagnosis not present

## 2017-04-04 NOTE — Progress Notes (Addendum)
Brittany [name]    [id number]    07-Sep-1955  Primary Care Physician:Carter, Maxine Glenn, DO  Referring Physician: Orpah Cobb, MD [address] [place], [place] [address]  Chief complaint: Consult for evaluation of COPD  HPI: 62 year old with coronary artery disease, hypertension, stroke.  She has had 2 admissions in November and December 2018 for recurrent pneumonia, COPD exacerbation.  Discharged on Symbicort, albuterol, supplemental oxygen bur is using inhalers intermittently but she feels she does not need them on a regular basis.  She continues on supplemental oxygen only at night.  Continues to smoke, down to 2 cigarettes a day.  Pets: None Occupation: Retired Environmental health practitioner Exposures: No known exposures Smoking history: 100 Pack-year smoking history.  Continues to smoke 2 cigarettes a day Travel History: Not significant  Outpatient Encounter Medications as of 04/04/2017  Medication Sig  . albuterol (PROVENTIL HFA;VENTOLIN HFA) 108 (90 Base) MCG/ACT inhaler Inhale 2 puffs into the lungs every 6 (six) hours as needed for wheezing or shortness of breath.  . B Complex-C (B-COMPLEX WITH VITAMIN C) tablet Take 1 tablet by mouth daily.  . budesonide-formoterol (SYMBICORT) 160-4.5 MCG/ACT inhaler Inhale 12 puffs into the lungs daily.   . feeding supplement, ENSURE ENLIVE, (ENSURE ENLIVE) LIQD Take 237 mLs by mouth 2 (two) times daily between meals.  Marland Kitchen losartan (COZAAR) 50 MG tablet Take 1 tablet (50 mg total) daily by mouth.  . nitroGLYCERIN (NITROSTAT) 0.4 MG SL tablet Place 1 tablet (0.4 mg total) under the tongue every 5 (five) minutes x 3 doses as needed for chest pain (chest pain).  . pantoprazole (PROTONIX) 40 MG tablet Take 1 tablet (40 mg total) by mouth daily.  . potassium chloride (K-DUR) 10 MEQ tablet Take 1 tablet (10 mEq total) by mouth every Monday, Wednesday, and Friday.  . [DISCONTINUED] predniSONE (DELTASONE) 5 MG tablet Take 4 tablets (20 mg total)  by mouth daily. X 2 days then 3 tablet (15 mg) daily x 2 days, then 2 tablet (10 mg.) Daily x 1 week, then 1 tablet (5 mg.) Daily x 2 weeks.  . nicotine (NICODERM CQ - DOSED IN MG/24 HOURS) 21 mg/24hr patch Place 1 patch (21 mg total) onto the skin daily. (Patient not taking: Reported on 04/04/2017)   No facility-administered encounter medications on file as of 04/04/2017.     Allergies as of 04/04/2017 - Review Complete 04/04/2017  Allergen Reaction Noted  . Amoxicillin Hives and Other (See Comments)   . Sulfonamide derivatives Nausea And Vomiting     Past Medical History:  Diagnosis Date  . Alzheimer disease   . Bilateral pneumonia 01/2017   per new patient packet   . COPD (chronic obstructive pulmonary disease) (HCC) 01/2017   per new patient packet   . Gallbladder disease 2014   per new patient packet   . Hernia, abdominal   . Hypertension   . Liver damage 01/2017   per new patient packet, Dr.Kadakia  . Normal cardiac stress test 2007  . Ovarian cyst 1977   8 1/2 lb left ovarian cyst, Dr.Cox, per new patient packet   . Right ovarian cyst    1992, 1993, 1994, 1995, and 1996 Dr.Neil, per new patient packet     Past Surgical History:  Procedure Laterality Date  . ABDOMINAL HYSTERECTOMY Bilateral    Total  . CESAREAN SECTION    . CHOLECYSTECTOMY    . curretage for endometriosis  93, 94,95, 96  . GYN surgery    .  HERNIA REPAIR    . OVARIAN CYST REMOVAL      Family History  Problem Relation Age of Onset  . Cancer Mother   . Stroke Mother   . COPD Mother   . Dementia Mother   . Diabetes Father   . Heart attack Father   . Diabetes Sister   . Stroke Sister        x2  . Hepatitis C Daughter     Social History   Socioeconomic History  . Marital status: Divorced    Spouse name: Not on file  . Number of children: Not on file  . Years of education: Not on file  . Highest education level: Not on file  Social Needs  . Financial resource strain: Not on file  .  Food insecurity - worry: Not on file  . Food insecurity - inability: Not on file  . Transportation needs - medical: Not on file  . Transportation needs - non-medical: Not on file  Occupational History  . Not on file  Tobacco Use  . Smoking status: Current Every Day Smoker    Packs/day: 25.00    Years: 40.00    Pack years: 1000.00    Types: Cigarettes  . Smokeless tobacco: Former NeurosurgeonUser  . Tobacco comment: Takes 2-3 puffs when smoking, 2-3 packs per day   Substance and Sexual Activity  . Alcohol use: No    Frequency: Never  . Drug use: Yes    Types: Marijuana    Comment: occasionally  . Sexual activity: No    Comment: disabled, `1 yr college, married.   Other Topics Concern  . 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 No   Do you have difficulty preparing food or eating? No   Do you have difficulty managing your medications? No   Do you have difficulty managing your finances? No   Do you have difficulty affording your medications? Yes Inhalers     Review of systems: Review of Systems  Constitutional: Negative for fever and chills.  HENT: Negative.   Eyes: Negative for blurred vision.  Respiratory: as per HPI  Cardiovascular: Negative for chest pain and palpitations.  Gastrointestinal: Negative for vomiting, diarrhea, blood per rectum. Genitourinary: Negative for dysuria, urgency, frequency and hematuria.  Musculoskeletal: Negative for myalgias, back pain and joint pain.  Skin: Negative for itching and rash.    Neurological: Negative for dizziness, tremors, focal weakness, seizures and loss of consciousness.  Endo/Heme/Allergies: Negative for environmental allergies.  Psychiatric/Behavioral: Negative for depression, suicidal ideas and hallucinations.  All other systems reviewed and are negative.  Physical Exam: Blood pressure 112/62, pulse (!) 103, height 5\' 2"  (1.575 m), weight 118 lb 3.2 oz (53.6 kg), SpO2 96 %. Gen:      No acute distress HEENT:  EOMI, sclera anicteric Neck:  No masses; no thyromegaly Lungs:    Clear to auscultation bilaterally; normal respiratory effort CV:         Regular rate and rhythm; no murmurs Abd:      + bowel sounds; soft, non-tender; no palpable masses, no distension Ext:    No edema; adequate peripheral perfusion Skin:      Warm and dry; no rash Neuro: alert and oriented x 3 Psych: normal mood and affect  Data Reviewed: Chest x-ray 01/27/17-inflation, bilateral pulmonary opacities Chest x-ray 01/28/17- right mid lung and biapical opacities Chest x-ray 02/16/17- improvement in right midlung and apical opacities.  Coarse interstitial marking I have reviewed the images personally  Assessment:  Evaluation for COPD Schedule pulmonary function test for better evaluation of lung function I have asked her to use the inhalers on a regular basis but she prefers to use them as needed We will reassess after completion of PFTs  Active smoker I have encouraged her to quit completely. Time spent counseling  Schedule for low-dose screening CT of the chest.  Plan/Recommendations: - Continue inhalers - PFTs - Low dose screening CT - Smoking cessation  Chilton Greathouse MD [facility] Pager [phone] 04/04/2017, 3:56 PM  CC: Orpah Cobb, MD

## 2017-04-04 NOTE — Patient Instructions (Signed)
Continue inhalers as prescribed. We will schedule you for pulmonary function test and refer for low-dose screening CT of the chest Follow-up in 3 months.

## 2017-04-05 ENCOUNTER — Ambulatory Visit
Admission: RE | Admit: 2017-04-05 | Discharge: 2017-04-05 | Disposition: A | Discharge status: Home / Self Care | Payer: Medicare HMO | Source: Ambulatory Visit | Attending: Internal Medicine | Admitting: Internal Medicine

## 2017-04-05 ENCOUNTER — Telehealth: Payer: Self-pay

## 2017-04-05 ENCOUNTER — Ambulatory Visit (INDEPENDENT_AMBULATORY_CARE_PROVIDER_SITE_OTHER): Payer: Medicare HMO

## 2017-04-05 VITALS — BP 122/74 | HR 84 | Temp 98.2°F | Ht 62.0 in | Wt 117.0 lb

## 2017-04-05 DIAGNOSIS — Z0000 Encounter for general adult medical examination without abnormal findings: Secondary | ICD-10-CM

## 2017-04-05 DIAGNOSIS — R945 Abnormal results of liver function studies: Principal | ICD-10-CM

## 2017-04-05 DIAGNOSIS — R7989 Other specified abnormal findings of blood chemistry: Secondary | ICD-10-CM

## 2017-04-05 DIAGNOSIS — R160 Hepatomegaly, not elsewhere classified: Secondary | ICD-10-CM

## 2017-04-05 NOTE — Progress Notes (Signed)
Subjective:   Vernessa Likes is a 62 y.o. female who presents for an Initial Medicare Annual Wellness Visit.        Objective:    Today's Vitals   04/05/17 1442  BP: 122/74  Pulse: 84  Temp: 98.2 F (36.8 C)  TempSrc: Oral  SpO2: 98%  Weight: 117 lb (53.1 kg)  Height: 5\' 2"  (1.575 m)   Body mass index is 21.4 kg/m.  Advanced Directives 04/05/2017 02/17/2017 02/17/2017 01/27/2017 01/27/2017 06/12/2015 05/03/2015  Does Patient Have a Medical Advance Directive? No Yes No No No No No  Would patient like information on creating a medical advance directive? Yes (MAU/Ambulatory/Procedural Areas - Information given) - Yes (Inpatient - patient requests chaplain consult to create a medical advance directive) No - Patient declined - No - patient declined information No - patient declined information    Current Medications (verified) Outpatient Encounter Medications as of 04/05/2017  Medication Sig  . albuterol (PROVENTIL HFA;VENTOLIN HFA) 108 (90 Base) MCG/ACT inhaler Inhale 2 puffs into the lungs every 6 (six) hours as needed for wheezing or shortness of breath.  . B Complex-C (B-COMPLEX WITH VITAMIN C) tablet Take 1 tablet by mouth daily.  . budesonide-formoterol (SYMBICORT) 160-4.5 MCG/ACT inhaler Inhale 12 puffs into the lungs daily.   . feeding supplement, ENSURE ENLIVE, (ENSURE ENLIVE) LIQD Take 237 mLs by mouth 2 (two) times daily between meals.  Marland Kitchen losartan (COZAAR) 50 MG tablet Take 1 tablet (50 mg total) daily by mouth.  . nicotine (NICODERM CQ - DOSED IN MG/24 HOURS) 21 mg/24hr patch Place 1 patch (21 mg total) onto the skin daily.  . nitroGLYCERIN (NITROSTAT) 0.4 MG SL tablet Place 1 tablet (0.4 mg total) under the tongue every 5 (five) minutes x 3 doses as needed for chest pain (chest pain).  . pantoprazole (PROTONIX) 40 MG tablet Take 1 tablet (40 mg total) by mouth daily.  . potassium chloride (K-DUR) 10 MEQ tablet Take 1 tablet (10 mEq total) by mouth every Monday, Wednesday,  and Friday.   No facility-administered encounter medications on file as of 04/05/2017.     Allergies (verified) Amoxicillin and Sulfonamide derivatives   History: Past Medical History:  Diagnosis Date  . Alzheimer disease   . Bilateral pneumonia 01/2017   per new patient packet   . COPD (chronic obstructive pulmonary disease) (HCC) 01/2017   per new patient packet   . Gallbladder disease 2014   per new patient packet   . Hernia, abdominal   . Hypertension   . Liver damage 01/2017   per new patient packet, Dr.Kadakia  . Normal cardiac stress test 2007  . Ovarian cyst 1977   8 1/2 lb left ovarian cyst, Dr.Cox, per new patient packet   . Right ovarian cyst    1992, 1993, 1994, 1995, and 1996 Dr.Neil, per new patient packet    Past Surgical History:  Procedure Laterality Date  . ABDOMINAL HYSTERECTOMY Bilateral    Total  . CESAREAN SECTION    . CHOLECYSTECTOMY    . curretage for endometriosis  93, 94,95, 96  . GYN surgery    . HERNIA REPAIR    . OVARIAN CYST REMOVAL     Family History  Problem Relation Age of Onset  . Cancer Mother   . Stroke Mother   . COPD Mother   . Dementia Mother   . Diabetes Father   . Heart attack Father   . Diabetes Sister   . Stroke Sister  x2  . Hepatitis C Daughter    Social History   Socioeconomic History  . Marital status: Divorced    Spouse name: Not on file  . Number of children: Not on file  . Years of education: Not on file  . Highest education level: Not on file  Social Needs  . Financial resource strain: Not on file  . Food insecurity - worry: Not on file  . Food insecurity - inability: Not on file  . Transportation needs - medical: Not on file  . Transportation needs - non-medical: Not on file  Occupational History  . Not on file  Tobacco Use  . Smoking status: Current Every Day Smoker    Packs/day: 25.00    Years: 40.00    Pack years: 1000.00    Types: Cigarettes  . Smokeless tobacco: Former NeurosurgeonUser  .  Tobacco comment: Takes 2-3 puffs when smoking, 4 cigarettes a day   Substance and Sexual Activity  . Alcohol use: No    Frequency: Never  . Drug use: Yes    Types: Marijuana    Comment: occasionally  . Sexual activity: No    Comment: disabled, `1 yr college, married.   Other Topics Concern  . 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 No   Do you have difficulty preparing food or eating? No   Do you have difficulty managing your medications? No   Do you have difficulty managing your finances? No   Do you have difficulty affording your medications? Yes Inhalers     Tobacco Counseling Ready to quit: Not Answered Counseling given: Not Answered Comment: Takes 2-3 puffs when smoking, 4 cigarettes a day    Clinical Intake:  Pre-visit preparation completed: No  Pain : No/denies pain     Nutritional Risks: None Diabetes: No  How often do you need to have someone help you when you read instructions, pamphlets, or other written materials from your doctor or pharmacy?: 1 - Never What is the last grade level you completed in school?: Some college  Interpreter Needed?: No  Information entered by :: Tyron RussellSara Anja Neuzil, rN   Activities of Daily Living In your present state of health, do you have any difficulty performing the following activities: 04/05/2017 02/17/2017  Hearing? Malvin JohnsY Y  Vision? N N  Difficulty concentrating or making decisions? Malvin JohnsY Y  Walking or climbing stairs? N Y  Dressing or bathing? N Y  Doing errands,  shopping? N N  Preparing Food and eating ? N -  Using the Toilet? N -  In the past six months, have you accidently leaked urine? N -  Do you have problems with loss of bowel control? N -  Managing your Medications? N -  Managing your Finances? N -  Housekeeping or managing your Housekeeping? N -  Some recent  data might be hidden     Immunizations and Health Maintenance Immunization History  Administered Date(s) Administered  . Influenza,inj,Quad PF,6+ Mos 01/31/2017  . Tetanus 03/21/2014   Health Maintenance Due  Topic Date Due  . Hepatitis C Screening  [birth year]  . PAP SMEAR  02/18/1979  . COLONOSCOPY  02/18/2008  . MAMMOGRAM  06/08/2015    Patient Care Team: Kirt Boys, DO as PCP - General (Internal Medicine) Chilton Greathouse, MD as Consulting Physician (Pulmonary Disease)  Indicate any recent Medical Services you may have received from other than [facility] providers in the past year (date may be approximate).     Assessment:   This is a routine wellness examination for [employer].  Hearing/Vision screen  Hearing Screening   125Hz  250Hz  500Hz  1000Hz  2000Hz  3000Hz  4000Hz  6000Hz  8000Hz   Right ear:   40 40 40  40    Left ear:   40 40 40  0    Vision Screening Comments: Has glasses that work well  Dietary issues and exercise activities discussed: Current Exercise Habits: The patient does not participate in regular exercise at present, Exercise limited by: respiratory conditions(s)  Goals    . Quit Smoking      Depression Screen PHQ 2/9 Scores 04/05/2017  PHQ - 2 Score 5  PHQ- 9 Score 12    Fall Risk Fall Risk  04/05/2017 03/08/2017  Falls in the past year? Yes Yes  Number falls in past yr: 2 or more 2 or more  Injury with Fall? No Yes    Is the patient's home free of loose throw rugs in walkways, pet beds, electrical cords, etc?   yes      Grab bars in the bathroom? yes      Handrails on the stairs?   yes      Adequate lighting?   yes  Timed Get Up and Go  Performed 22 seconds, fall risk  Cognitive Function: MMSE - Mini Mental State Exam 04/05/2017  Orientation to time 5  Orientation to Place 5  Registration 3  Attention/ Calculation 5  Recall 2  Language- name 2 objects 2  Language- repeat 1  Language- follow 3 step command 3  Language- read & follow direction 1  Write a sentence 1  Copy design 1  Total score 29        Screening Tests Health Maintenance  Topic Date Due  . Hepatitis C Screening  Jun 13, 1955  . PAP SMEAR  02/18/1979  . COLONOSCOPY  02/18/2008  . MAMMOGRAM  06/08/2015  . TETANUS/TDAP  03/21/2024  . INFLUENZA VACCINE  Completed  . HIV Screening  Completed    Qualifies for Shingles Vaccine?No, Pt is not 65 years ol  Cancer Screenings: Lung: Low Dose CT Chest recommended if Age 23-80 years, 30 pack-year currently smoking OR have quit w/in 15years. Patient does qualify. Breast: Up to date on Mammogram? Yes   Up to date of Bone Density/Dexa? Yes Colorectal: up to date  Additional Screenings:  Hepatitis B/HIV/Syphillis:declined Hepatitis C Screening: declined     Plan:    I have personally reviewed and addressed the Medicare Annual Wellness questionnaire and have noted the following in the patient's chart:  A. Medical and social history B. Use of alcohol, tobacco or illicit drugs  C. Current medications and supplements D. Functional ability and status E.  Nutritional status F.  Physical activity G. Advance directives H. List of other physicians I.  Hospitalizations, surgeries, and ER visits in previous 12 months J.  Vitals K. Screenings to include hearing, vision, cognitive, depression L. Referrals and appointments - none  In addition, I have reviewed and discussed with patient certain preventive protocols, quality metrics, and best practice recommendations. A written personalized care plan for preventive services as well as general preventive health recommendations were provided to patient.  See  attached scanned questionnaire for additional information.   Signed,   Tyron Russell, RN Nurse Health Advisor   Quick Notes   Health Maintenance: Papsmear and mammogram declined. Cologuard information filled out.      Abnormal Screen: MMSE 29/30. Passed clock drawing     Patient Concerns: Anxiety     Nurse Concerns: None

## 2017-04-05 NOTE — Telephone Encounter (Signed)
Contact made with SW whom had called client stated client was feeling better.

## 2017-04-05 NOTE — Congregational Nurse Program (Signed)
Congregational Nurse Program Note  Date of Encounter: 03/31/2017  Past Medical History: Past Medical History:  Diagnosis Date  . Alzheimer disease   . Bilateral pneumonia 01/2017   per new patient packet   . COPD (chronic obstructive pulmonary disease) (HCC) 01/2017   per new patient packet   . Gallbladder disease 2014   per new patient packet   . Hernia, abdominal   . Hypertension   . Liver damage 01/2017   per new patient packet, Dr.Kadakia  . Normal cardiac stress test 2007  . Ovarian cyst 1977   8 1/2 lb left ovarian cyst, Dr.Cox, per new patient packet   . Right ovarian cyst    1992, 1993, 1994, 1995, and 1996 Dr.Neil, per new patient packet     Encounter Details: CNP Questionnaire - 04/05/17 1842      Questionnaire   Patient Status  Not Applicable    Race  Black or African American    Location Patient Served At  [employer] [facility]    Insurance  Medicaid    Uninsured  Not Applicable    Food  Yes, have food insecurities    Housing/Utilities  No permanent housing    Transportation  Provided transportation assistance (bus pass, taxi voucher, etc.);Yes, need transportation assistance;Within past 12 months, lack of transportation negatively impacted life    Interpersonal Safety  Yes, feel physically and emotionally safe where you currently live    Medication  No medication insecurities    Medical Provider  Yes    Referrals  Primary Care Provider/Clinic;Medication Assistance;Area Agency    ED Visit Averted  Not Applicable    Life-Saving Intervention Made  Not Applicable      Clinical Intake - 04/05/17 1431      Pre-visit preparation   Pre-visit preparation completed  No      Pain   Pain   No/denies pain      Nutrition Screen   Nutritional Risks  None    Diabetes  No      Functional Status   Activities of Daily Living  Independent    Ambulation  Independent with device- listed below    Home Assistive Devices/Equipment  Eyeglasses;Dentures (specify type);Oxygen top  denture, bottom partial, 2L   top denture, bottom partial, 2L   Medication Administration  Independent    Home Management  Independent      Risk/Barriers   Barriers to Care Management & Learning  None      Abuse/Neglect   Do you feel unsafe in your current relationship?  No    Do you feel physically threatened by others?  No    Anyone hurting you at home, work, or school?  No    Unable to ask?  No    Information provided on Community resources  No      Patient Literacy   How often do you need to have someone help you when you read instructions, pamphlets, or other written materials from your doctor or pharmacy?  1 - Never    What is the last grade level you completed in school?  Some college      Web designer Needed?  No      Comments   Information entered by :  Tyron Russell, rN     HOPES follow up visit to check on client as she stated she wasn't feeling well. BP about the same ,no other symptoms at first then client states she needed to take a nitroglycerine  pill for chest pain , no SOB noticed ,stated she was under some stress ,didn't elaborate because he daughter was there but indicated it was with her situation.  Client in about 30 minutes states there was no more pain, States her blood pressure medication has been recalled but hasn't heard any thing from her MD ,  Counseled on her need to have Md switch med's and just not stop taking them ,will follow up with her PCP ,has several appointments and [facility] will be called to pick her up ,client has her [facility] ID  Now which will help with appointments . Client was able to get out and go to church with a friend this past week and she was excited about that. SW and client still searching for housing has some promising leads on possibilities for mid February and March. Client stated she hasn't eaten in 1 1/2 days only drunk some water ,counseled regarding dehydration and  the body's   need for food and fluids . Hasn't drunk  ensure states she is out,sister will pick her up some.  SW will get client some drinks and soup.Client seemed okay as visit went on she focused on her goals and upcoming appointments. Plans  Call 02 agency have them come out and change filter  Continue house search and leads  Call [facility] for appointments on 04-05-17 and 04-06-17  Call PCP or 911 if symptoms continue Force fluids if she has no appetite Call team if she isn't feeling better on tomorrow   Team to request extension  In HOPES until goals are complete

## 2017-04-05 NOTE — Patient Instructions (Signed)
Brittany [name] , Thank you for taking time to come for your Medicare Wellness Visit. I appreciate your ongoing commitment to your health goals. Please review the following plan we discussed and let me know if I can assist you in the future.   Screening recommendations/referrals: Colonoscopy due, cologuard information filled out Mammogram due, declined Bone Density due at age 60 Recommended yearly ophthalmology/optometry visit for glaucoma screening and checkup Recommended yearly dental visit for hygiene and checkup  Vaccinations: Influenza vaccine up to date. Due 2019 fall season Pneumococcal vaccine due at age 30 Tdap vaccine due, declined for now Shingles vaccine due at age 3    Advanced directives: Please bring [place] a copy of your living will and health care power of attorney  Conditions/risks identified: None  Next appointment: Dr. Eulas Post 04/11/2017 @ 2:45pm            Tyson Dense, RN 04/06/2018 @ 2:30pm  Preventive Care 40-64 Years, Female Preventive care refers to lifestyle choices and visits with your health care provider that can promote health and wellness. What does preventive care include?  A yearly physical exam. This is also called an annual well check.  Dental exams once or twice a year.  Routine eye exams. Ask your health care provider how often you should have your eyes checked.  Personal lifestyle choices, including:  Daily care of your teeth and gums.  Regular physical activity.  Eating a healthy diet.  Avoiding tobacco and drug use.  Limiting alcohol use.  Practicing safe sex.  Taking low-dose aspirin daily starting at age 21.  Taking vitamin and mineral supplements as recommended by your health care provider. What happens during an annual well check? The services and screenings done by your health care provider during your annual well check will depend on your age, overall health, lifestyle risk factors, and family history of disease. Counseling    Your health care provider may ask you questions about your:  Alcohol use.  Tobacco use.  Drug use.  Emotional well-being.  Home and relationship well-being.  Sexual activity.  Eating habits.  Work and work Statistician.  Method of birth control.  Menstrual cycle.  Pregnancy history. Screening  You may have the following tests or measurements:  Height, weight, and BMI.  Blood pressure.  Lipid and cholesterol levels. These may be checked every 5 years, or more frequently if you are over 63 years old.  Skin check.  Lung cancer screening. You may have this screening every year starting at age 16 if you have a 30-pack-year history of smoking and currently smoke or have quit within the past 15 years.  Fecal occult blood test (FOBT) of the stool. You may have this test every year starting at age 107.  Flexible sigmoidoscopy or colonoscopy. You may have a sigmoidoscopy every 5 years or a colonoscopy every 10 years starting at age 53.  Hepatitis C blood test.  Hepatitis B blood test.  Sexually transmitted disease (STD) testing.  Diabetes screening. This is done by checking your blood sugar (glucose) after you have not eaten for a while (fasting). You may have this done every 1-3 years.  Mammogram. This may be done every 1-2 years. Talk to your health care provider about when you should start having regular mammograms. This may depend on whether you have a family history of breast cancer.  BRCA-related cancer screening. This may be done if you have a family history of breast, ovarian, tubal, or peritoneal cancers.  Pelvic exam and Pap  test. This may be done every 3 years starting at age 64. Starting at age 52, this may be done every 5 years if you have a Pap test in combination with an HPV test.  Bone density scan. This is done to screen for osteoporosis. You may have this scan if you are at high risk for osteoporosis. Discuss your test results, treatment options, and if  necessary, the need for more tests with your health care provider. Vaccines  Your health care provider may recommend certain vaccines, such as:  Influenza vaccine. This is recommended every year.  Tetanus, diphtheria, and acellular pertussis (Tdap, Td) vaccine. You may need a Td booster every 10 years.  Zoster vaccine. You may need this after age 20.  Pneumococcal 13-valent conjugate (PCV13) vaccine. You may need this if you have certain conditions and were not previously vaccinated.  Pneumococcal polysaccharide (PPSV23) vaccine. You may need one or two doses if you smoke cigarettes or if you have certain conditions. Talk to your health care provider about which screenings and vaccines you need and how often you need them. This information is not intended to replace advice given to you by your health care provider. Make sure you discuss any questions you have with your health care provider. Document Released: 04/03/2015 Document Revised: 11/25/2015 Document Reviewed: 01/06/2015 Elsevier Interactive Patient Education  2017 [place] Prevention in the Home Falls can cause injuries. They can happen to people of all ages. There are many things you can do to make your home safe and to help prevent falls. What can I do on the outside of my home?  Regularly fix the edges of walkways and driveways and fix any cracks.  Remove anything that might make you trip as you walk through a door, such as a raised step or threshold.  Trim any bushes or trees on the path to your home.  Use bright outdoor lighting.  Clear any walking paths of anything that might make someone trip, such as rocks or tools.  Regularly check to see if handrails are loose or broken. Make sure that both sides of any steps have handrails.  Any raised decks and porches should have guardrails on the edges.  Have any leaves, snow, or ice cleared regularly.  Use sand or salt on walking paths during  winter.  Clean up any spills in your garage right away. This includes oil or grease spills. What can I do in the bathroom?  Use night lights.  Install grab bars by the toilet and in the tub and shower. Do not use towel bars as grab bars.  Use non-skid mats or decals in the tub or shower.  If you need to sit down in the shower, use a plastic, non-slip stool.  Keep the floor dry. Clean up any water that spills on the floor as soon as it happens.  Remove soap buildup in the tub or shower regularly.  Attach bath mats securely with double-sided non-slip rug tape.  Do not have throw rugs and other things on the floor that can make you trip. What can I do in the bedroom?  Use night lights.  Make sure that you have a light by your bed that is easy to reach.  Do not use any sheets or blankets that are too big for your bed. They should not hang down onto the floor.  Have a firm chair that has side arms. You can use this for support while you get  dressed.  Do not have throw rugs and other things on the floor that can make you trip. What can I do in the kitchen?  Clean up any spills right away.  Avoid walking on wet floors.  Keep items that you use a lot in easy-to-reach places.  If you need to reach something above you, use a strong step stool that has a grab bar.  Keep electrical cords out of the way.  Do not use floor polish or wax that makes floors slippery. If you must use wax, use non-skid floor wax.  Do not have throw rugs and other things on the floor that can make you trip. What can I do with my stairs?  Do not leave any items on the stairs.  Make sure that there are handrails on both sides of the stairs and use them. Fix handrails that are broken or loose. Make sure that handrails are as long as the stairways.  Check any carpeting to make sure that it is firmly attached to the stairs. Fix any carpet that is loose or worn.  Avoid having throw rugs at the top or  bottom of the stairs. If you do have throw rugs, attach them to the floor with carpet tape.  Make sure that you have a light switch at the top of the stairs and the bottom of the stairs. If you do not have them, ask someone to add them for you. What else can I do to help prevent falls?  Wear shoes that:  Do not have high heels.  Have rubber bottoms.  Are comfortable and fit you well.  Are closed at the toe. Do not wear sandals.  If you use a stepladder:  Make sure that it is fully opened. Do not climb a closed stepladder.  Make sure that both sides of the stepladder are locked into place.  Ask someone to hold it for you, if possible.  Clearly mark and make sure that you can see:  Any grab bars or handrails.  First and last steps.  Where the edge of each step is.  Use tools that help you move around (mobility aids) if they are needed. These include:  Canes.  Walkers.  Scooters.  Crutches.  Turn on the lights when you go into a dark area. Replace any light bulbs as soon as they burn out.  Set up your furniture so you have a clear path. Avoid moving your furniture around.  If any of your floors are uneven, fix them.  If there are any pets around you, be aware of where they are.  Review your medicines with your doctor. Some medicines can make you feel dizzy. This can increase your chance of falling. Ask your doctor what other things that you can do to help prevent falls. This information is not intended to replace advice given to you by your health care provider. Make sure you discuss any questions you have with your health care provider. Document Released: 01/01/2009 Document Revised: 08/13/2015 Document Reviewed: 04/11/2014 Elsevier Interactive Patient Education  2017 [employer].

## 2017-04-11 ENCOUNTER — Ambulatory Visit (INDEPENDENT_AMBULATORY_CARE_PROVIDER_SITE_OTHER): Payer: Medicare HMO | Admitting: Internal Medicine

## 2017-04-11 ENCOUNTER — Encounter: Payer: Self-pay | Admitting: Internal Medicine

## 2017-04-11 VITALS — BP 130/82 | HR 92 | Temp 97.9°F | Resp 20 | Ht 62.0 in | Wt 113.4 lb

## 2017-04-11 DIAGNOSIS — R945 Abnormal results of liver function studies: Secondary | ICD-10-CM

## 2017-04-11 DIAGNOSIS — E440 Moderate protein-calorie malnutrition: Secondary | ICD-10-CM | POA: Diagnosis not present

## 2017-04-11 DIAGNOSIS — R748 Abnormal levels of other serum enzymes: Secondary | ICD-10-CM

## 2017-04-11 DIAGNOSIS — Z720 Tobacco use: Secondary | ICD-10-CM | POA: Diagnosis not present

## 2017-04-11 DIAGNOSIS — F322 Major depressive disorder, single episode, severe without psychotic features: Secondary | ICD-10-CM

## 2017-04-11 DIAGNOSIS — Z0000 Encounter for general adult medical examination without abnormal findings: Secondary | ICD-10-CM

## 2017-04-11 DIAGNOSIS — F411 Generalized anxiety disorder: Secondary | ICD-10-CM | POA: Diagnosis not present

## 2017-04-11 DIAGNOSIS — E2839 Other primary ovarian failure: Secondary | ICD-10-CM | POA: Diagnosis not present

## 2017-04-11 DIAGNOSIS — R160 Hepatomegaly, not elsewhere classified: Secondary | ICD-10-CM | POA: Diagnosis not present

## 2017-04-11 DIAGNOSIS — I10 Essential (primary) hypertension: Secondary | ICD-10-CM

## 2017-04-11 DIAGNOSIS — R7989 Other specified abnormal findings of blood chemistry: Secondary | ICD-10-CM

## 2017-04-11 MED ORDER — BUPROPION HCL ER (SR) 150 MG PO TB12: 150.0000 mg | ORAL_TABLET | Freq: Every day | ORAL | 3 refills | Status: DC

## 2017-04-11 NOTE — Patient Instructions (Addendum)
START BUPROPION SR 1 TABLET DAILY  for mood and smoking cessation  Continue other medications as ordered  Will call with lab results  Will call with bone density appt  Follow up in 1 month for depression  Keeping You Healthy  Get These Tests  Blood Pressure- Have your blood pressure checked by your healthcare provider at least once a year.  Normal blood pressure is 120/80.  Weight- Have your body mass index (BMI) calculated to screen for obesity.  BMI is a measure of body fat based on height and weight.  You can calculate your own BMI at [email or web address]-[email or web address]  Cholesterol- Have your cholesterol checked every year.  Diabetes- Have your blood sugar checked every year if you have high blood pressure, high cholesterol, a family history of diabetes or if you are overweight.  Pap Test - Have a pap test every 1 to 5 years if you have been sexually active.  If you are older than 65 and recent pap tests have been normal you may not need additional pap tests.  In addition, if you have had a hysterectomy  for benign disease additional pap tests are not necessary.  Mammogram-Yearly mammograms are essential for early detection of breast cancer  Screening for Colon Cancer- Colonoscopy starting at age 60. Screening may begin sooner depending on your family history and other health conditions.  Follow up colonoscopy as directed by your Gastroenterologist.  Screening for Osteoporosis- Screening begins at age 60 with bone density scanning, sooner if you are at higher risk for developing Osteoporosis.  Get these medicines  Calcium with Vitamin D- Your body requires 1200-1500 mg of Calcium a day and [phone] IU of Vitamin D a day.  You can only absorb 500 mg of Calcium at a time therefore Calcium must be taken in 2 or 3 separate doses throughout the day.  Hormones- Hormone therapy has been associated with increased risk for certain cancers and heart disease.  Talk to your healthcare provider about  if you need relief from menopausal symptoms.  Aspirin- Ask your healthcare provider about taking Aspirin to prevent Heart Disease and Stroke.  Get these Immuniztions  Flu shot- Every fall  Pneumonia shot- Once after the age of 60; if you are younger ask your healthcare provider if you need a pneumonia shot.  Tetanus- Every ten years.  Zostavax- Once after the age of 60 to prevent shingles.  Take these steps  Don't smoke- Your healthcare provider can help you quit. For tips on how to quit, ask your healthcare provider or go to www.smokefree.gov or call 1-800 QUIT-NOW.  Be physically active- Exercise 5 days a week for a minimum of 30 minutes.  If you are not already physically active, start slow and gradually work up to 30 minutes of moderate physical activity.  Try walking, dancing, bike riding, swimming, etc.  Eat a healthy diet- Eat a variety of healthy foods such as fruits, vegetables, whole grains, low fat milk, low fat cheeses, yogurt, lean meats, chicken, fish, eggs, dried beans, tofu, etc.  For more information go to www.thenutritionsource.org  Dental visit- Brush and floss teeth twice daily; visit your dentist twice a year.  Eye exam- Visit your Optometrist or Ophthalmologist yearly.  Drink alcohol in moderation- Limit alcohol intake to one drink or less a day.  Never drink and drive.  Depression- Your emotional health is as important as your physical health.  If you're feeling down or losing interest in things you normally enjoy, please talk to your  healthcare provider.  Seat Belts- can save your life; always wear one  Smoke/Carbon Monoxide detectors- These detectors need to be installed on the appropriate level of your home.  Replace batteries at least once a year.  Violence- If anyone is threatening or hurting you, please tell your healthcare provider.  Living Will/ Health care power of attorney- Discuss with your healthcare provider and family.

## 2017-04-11 NOTE — Progress Notes (Signed)
Patient ID: Brittany [name], female   DOB: October 27, 1955, 62 y.o.   MRN: [id number]   Location:  [facility]  Place of Service:  OFFICE  Provider: Arletha Grippe, DO  Patient Care Team: Gildardo Cranker, DO as PCP - General (Internal Medicine) Marshell Garfinkel, MD as Consulting Physician (Pulmonary Disease)  Extended Emergency Contact Information Primary Emergency Contact: Janne Napoleon [place] of [place] Phone: [phone] Mobile Phone: [phone] Relation: Sister  Code Status: Goals of Care: Advanced Directive information Advanced Directives 04/05/2017  Does Patient Have a Medical Advance Directive? No  Would patient like information on creating a medical advance directive? Yes (MAU/Ambulatory/Procedural Areas - Information given)     Chief Complaint  Patient presents with  . Annual Exam    HPI: Patient is a 62 y.o. female seen in today for a comprehensive exam. AWV note from 04/05/17 reviewed.  Declined colonoscopy and mammogram and states she does not care about it and would not do anything if she had cancer. Her mother had breast CA. She is awaiting cologuard.   Per last OV "She was hospitalized x 2 in month of November for COPD exacerbation and CAP. Labs revealed ALT 1330; AST 392; TBili 4.8; alk phos 162. She has trouble with [place] [employer] tanks and supplies. She stopped taking prednisone due to increased anxiety. She has increased anxiety and feels anxious most times of the day. She is currently living in a motel and does not have reliable transportation to get to appts. She feels depressed. No SI/HI. she has [place] Therapist, sports and SW. PHQ9 score 23; GAD score 13 by SW." She now has [facility]. She is still smoking.  She is really c/a daughter who lives with her and is heroine addict and pregnant. PHQ9 score 12 in office  COPD/chronic tobacco abuse - followed by [facility]. She takes albuterol HFA, symbicort. She plans to use nicotine patch to help her stop smoking.  HTN - followed by cardio  Dr Doylene Canard. Stable on losartan and prn SLNTG  Hypokalemia - stable on K dur. K 4.3  GERD - controlled on protonix   Malnutrition - resolved. albumin 4. She has been using strawberry flavored ensure  Dementia - alzheimer's; worse with increased anxiety. MMSE 29/30.  Depression screen PHQ 2/9 04/05/2017  Decreased Interest 2  Down, Depressed, Hopeless 3  PHQ - 2 Score 5  Altered sleeping 3  Tired, decreased energy 0  Change in appetite 1  Feeling bad or failure about yourself  3  Trouble concentrating 0  Moving slowly or fidgety/restless 0  Suicidal thoughts 0  PHQ-9 Score 12  Difficult doing work/chores Very difficult    Fall Risk  04/05/2017 03/08/2017  Falls in the past year? Yes Yes  Number falls in past yr: 2 or more 2 or more  Injury with Fall? No Yes   MMSE - Mini Mental State Exam 04/05/2017  Orientation to time 5  Orientation to Place 5  Registration 3  Attention/ Calculation 5  Recall 2  Language- name 2 objects 2  Language- repeat 1  Language- follow 3 step command 3  Language- read & follow direction 1  Write a sentence 1  Copy design 1  Total score 29     Health Maintenance  Topic Date Due  . Hepatitis C Screening  1955/09/11  . PAP SMEAR  02/18/1979  . COLONOSCOPY  02/18/2008  . MAMMOGRAM  06/08/2015  . TETANUS/TDAP  03/21/2024  . INFLUENZA VACCINE  Completed  . HIV Screening  Completed    Past Medical History:  Diagnosis Date  . Alzheimer disease   . Bilateral pneumonia 01/2017   per new patient packet   . COPD (chronic obstructive pulmonary disease) ([place]) 01/2017   per new patient packet   . Gallbladder disease 2014   per new patient packet   . Hernia, abdominal   . Hypertension   . Liver damage 01/2017   per new patient packet, Dr.Kadakia  . Normal cardiac stress test 2007  . Ovarian cyst 1977   8 1/2 lb left ovarian cyst, Dr.Cox, per new patient packet   . Right ovarian cyst    1992, 1993, 1994, 1995, and 1996 Dr.Neil, per new  patient packet     Past Surgical History:  Procedure Laterality Date  . ABDOMINAL HYSTERECTOMY Bilateral    Total  . CESAREAN SECTION    . CHOLECYSTECTOMY    . curretage for endometriosis  93, 94,95, 96  . GYN surgery    . HERNIA REPAIR    . OVARIAN CYST REMOVAL      Family History  Problem Relation Age of Onset  . Cancer Mother   . Stroke Mother   . COPD Mother   . Dementia Mother   . Diabetes Father   . Heart attack Father   . Diabetes Sister   . Stroke Sister        x2  . Hepatitis C Daughter    Family Status  Relation Name Status  . Mother Janice Norrie Deceased  . Father Rush Landmark Deceased  . Sister [employer]  . Sister [employer]  . Daughter Arrie Aran    Social History   Socioeconomic History  . Marital status: Divorced    Spouse name: Not on file  . Number of children: Not on file  . Years of education: Not on file  . Highest education level: Not on file  Social Needs  . Financial resource strain: Not on file  . Food insecurity - worry: Not on file  . Food insecurity - inability: Not on file  . Transportation needs - medical: Not on file  . Transportation needs - non-medical: Not on file  Occupational History  . Not on file  Tobacco Use  . Smoking status: Current Every Day Smoker    Packs/day: 25.00    Years: 40.00    Pack years: 1000.00    Types: Cigarettes  . Smokeless tobacco: Former Systems developer  . Tobacco comment: Takes 2-3 puffs when smoking, 4 cigarettes a day   Substance and Sexual Activity  . Alcohol use: No    Frequency: Never  . Drug use: Yes    Types: Marijuana    Comment: occasionally  . Sexual activity: No    Comment: disabled, `1 yr college, married.   Other Topics Concern  . 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 No   Do you have difficulty preparing food or eating? No   Do you have difficulty managing your medications? No   Do you have difficulty managing your finances? No   Do you have difficulty affording your medications? Yes Inhalers     Allergies  Allergen Reactions  . Amoxicillin Hives and Other (See Comments)    Whelps    . Sulfonamide Derivatives Nausea And Vomiting    Allergies as of 04/11/2017      Reactions   Amoxicillin Hives, Other (See Comments)   Whelps Has patient had a PCN reaction causing immediate rash, facial/tongue/throat swelling, SOB or lightheadedness with hypotension: Yes welps Has patient had a PCN reaction causing severe rash involving mucus membranes or skin necrosis: Has patient had a PCN reaction that required hospitalization No Has patient had a PCN reaction occurring within the last 10 years: Yes If all of the above answers are "NO", then may proceed with Cephalosporin use.   Sulfonamide Derivatives Nausea And Vomiting      Medication List        Accurate as of 04/11/17  2:30 PM. Always use your most recent med list.          albuterol 108 (90 Base) MCG/ACT inhaler Commonly known as:  PROVENTIL HFA;VENTOLIN HFA Inhale 2 puffs into the lungs every 6 (six) hours as needed for wheezing or shortness of breath.   B-complex with vitamin C tablet Take 1 tablet by mouth daily.   budesonide-formoterol 160-4.5 MCG/ACT inhaler Commonly known as:  SYMBICORT Inhale 12 puffs into the lungs daily.   feeding supplement (ENSURE ENLIVE) Liqd Take 237 mLs by mouth 2 (two) times daily between meals.   losartan 50 MG tablet Commonly known as:  COZAAR Take 1 tablet (50 mg total) daily by mouth.   nicotine 21 mg/24hr  patch Commonly known as:  NICODERM CQ - dosed in mg/24 hours Place 1 patch (21 mg total) onto the skin daily.   nitroGLYCERIN 0.4 MG SL tablet Commonly known as:  NITROSTAT Place 1 tablet (0.4 mg total) under the tongue every 5 (five) minutes x 3 doses as needed for chest pain (chest pain).   pantoprazole 40 MG tablet Commonly known as:  PROTONIX Take 1 tablet (40 mg total) by mouth daily.   potassium chloride 10 MEQ tablet Commonly known as:  K-DUR Take 1 tablet (10 mEq total) by mouth every Monday, Wednesday, and Friday.        Review of Systems:  Review of Systems  HENT: Positive for facial swelling.   Respiratory: Positive for cough.   All other systems reviewed and are negative.   Physical Exam: Vitals:   04/11/17 1421  BP: 130/82  Pulse: 92  Resp: 20  Temp: 97.9 F (36.6 C)  TempSrc: Oral  SpO2: 96%  Weight: 113 lb 6.4 oz (51.4 kg)  Height: 5' 2"  (1.575 m)   Body mass index is 20.74 kg/m. Physical Exam  Constitutional: She is oriented to person, place, and time. She appears well-developed and well-nourished.  HENT:  Mouth/Throat: Oropharynx is clear and moist. No oropharyngeal exudate.  MMM; no oral thrush; left TM dull but no redness and intact; left maxillary sinus boggy tissue texture changes  Eyes: Pupils are equal, round, and reactive to light. No scleral icterus.  Neck: Neck supple. Carotid bruit is not present. No tracheal deviation present. No thyromegaly present.  Cardiovascular: Normal rate, regular rhythm and intact distal pulses.  Exam reveals no gallop and no friction rub.  No murmur heard. No LE edema b/l. no calf TTP.   Pulmonary/Chest: Effort normal and breath sounds normal. No stridor. No respiratory distress. She has no wheezes. She has no rales.  Declined breast exam  Abdominal: Soft. Normal appearance and bowel sounds are normal. She exhibits no distension and no mass. There is no hepatomegaly. There is no tenderness. There is no  rigidity, no rebound and no guarding. No hernia.  Genitourinary:  Genitourinary Comments: Pt declined  Musculoskeletal: She exhibits edema.  Lymphadenopathy:    She has no cervical adenopathy.  Neurological: She is alert and oriented to person, place, and time.  Skin: Skin is warm and dry. No rash noted.  Psychiatric: Her behavior is normal. Thought content normal. Cognition and memory are not impaired. She expresses inappropriate judgment. She exhibits a depressed mood (and teary eyed). She expresses no suicidal (but states "I wish I was dead") ideation.    Labs reviewed:  Basic Metabolic Panel: Recent Labs    01/29/17 0515 01/30/17 0450  02/01/17 0437  02/17/17 0313 02/18/17 0845 03/08/17 1231  NA 143 142   < > 138   < > 136 136 137  K 2.7* 5.3*   < > 4.6   < > 2.7* 4.2 4.3  CL 106 110   < > 105   < > 104 104 101  CO2 22 22   < > 25   < > 24 23 27   GLUCOSE 183* 191*   < > 181*   < > 125* 136* 83  BUN 24* 45*   < > 49*   < > 8 11 15   CREATININE 0.75 0.78   < > 0.73   < > 0.66 0.67 0.60  CALCIUM 9.1 9.3   < > 8.7*   < > 8.2* 9.0 9.4  MG 1.9 2.3  --  2.4  --   --   --   --    < > = values in this interval not displayed.   Liver Function Tests: Recent Labs    11/28/16 1434 01/28/17 0514 02/16/17 1829 03/08/17 1231  AST 61* 15 392* 221*  ALT 73* 14 1,330* 335*  ALKPHOS 92 56 162*  --   BILITOT 1.4* 0.5 4.8* 1.0  1.0  PROT 6.1* 6.2* 5.4* 6.4  ALBUMIN 3.5 3.1* 2.8*  --    No results for input(s): LIPASE, AMYLASE in the last 8760 hours. No results for input(s): AMMONIA in the last 8760 hours. CBC: Recent Labs    01/31/17 0418 02/01/17 0437 02/16/17 1829  WBC 17.2* 23.8* 5.9  NEUTROABS  --   --  3.5  HGB 15.6* 14.6 12.0  HCT 45.4 43.2 35.0*  MCV 88.8 89.4 86.8  PLT 322 271 183   Lipid Panel: No results for input(s): CHOL, HDL, LDLCALC, TRIG, CHOLHDL, LDLDIRECT in the last 8760 hours. No results found for: HGBA1C  Procedures: US Abdomen Limited  Ruq  Result Date: 04/05/2017 CLINICAL DATA:  Elevated liver function tests, hepatomegaly EXAM: ULTRASOUND ABDOMEN LIMITED RIGHT UPPER QUADRANT COMPARISON:  CT abdomen pelvis of 02/28/2013 FINDINGS: Gallbladder: The gallbladder has previously been resected. Common bile duct: Diameter: The common bile duct is normal measuring 2.2 mm in diameter. Liver: The parenchyma of the liver is slightly echogenic suggesting mild fatty infiltration. No focal hepatic abnormality is seen. Portal vein is patent on color Doppler imaging with normal direction of blood flow towards the liver. IMPRESSION: 1. Slightly  echogenic liver parenchyma may indicate mild fatty infiltration. No focal abnormality. 2. Prior cholecystectomy. Electronically Signed   By: Ivar Drape M.D.   On: 04/05/2017 12:32    Assessment/Plan   ICD-10-CM   1. Well adult exam Z00.00   2. Current severe episode of major depressive disorder without psychotic features, unspecified whether recurrent (HCC) F32.2 buPROPion (WELLBUTRIN SR) 150 MG 12 hr tablet  3. GAD (generalized anxiety disorder) F41.1 buPROPion (WELLBUTRIN SR) 150 MG 12 hr tablet  4. Elevated LFTs R94.5 Hepatic Function Panel    AFP Tumor Marker  5. Malnutrition of moderate degree (HCC) E44.0   6. Hepatomegaly R16.0 Hepatic Function Panel    AFP Tumor Marker  7. Benign essential HTN I10   8. Estrogen deficiency E28.39 DG BONE DENSITY (DXA)  9. Tobacco abuse Z72.0 buPROPion (WELLBUTRIN SR) 150 MG 12 hr tablet  10. Elevated serum GGT level R74.8 AFP Tumor Marker   START BUPROPION SR 1 TABLET DAILY  for mood and smoking cessation  Continue other medications as ordered  F/u with cardio as scheduled  Will call with lab results  Will call with bone density appt  Follow up in 1 month for depression  Keeping You Healthy handout given    Cordella Register. Perlie Gold  [facility] [address] [place], [place] [address] [phone]  Cell (Monday-Friday 8 AM - 5 PM) [phone] After 5 PM and follow prompts

## 2017-04-12 LAB — HEPATIC FUNCTION PANEL
AG RATIO: 1.6 (calc) (ref 1.0–2.5)
ALT: 126 U/L — ABNORMAL HIGH (ref 6–29)
AST: 78 U/L — ABNORMAL HIGH (ref 10–35)
Albumin: 4.4 g/dL (ref 3.6–5.1)
Alkaline phosphatase (APISO): 84 U/L (ref 33–130)
BILIRUBIN DIRECT: 0.2 mg/dL (ref 0.0–0.2)
BILIRUBIN INDIRECT: 0.6 mg/dL (ref 0.2–1.2)
BILIRUBIN TOTAL: 0.8 mg/dL (ref 0.2–1.2)
Globulin: 2.8 g/dL (calc) (ref 1.9–3.7)
Total Protein: 7.2 g/dL (ref 6.1–8.1)

## 2017-04-12 LAB — AFP TUMOR MARKER: AFP TUMOR MARKER: 6.7 ng/mL — AB

## 2017-04-13 ENCOUNTER — Other Ambulatory Visit: Payer: Self-pay

## 2017-04-13 DIAGNOSIS — R945 Abnormal results of liver function studies: Principal | ICD-10-CM

## 2017-04-13 DIAGNOSIS — R7989 Other specified abnormal findings of blood chemistry: Secondary | ICD-10-CM

## 2017-04-13 DIAGNOSIS — R772 Abnormality of alphafetoprotein: Secondary | ICD-10-CM

## 2017-04-17 ENCOUNTER — Encounter: Payer: Self-pay | Admitting: Gastroenterology

## 2017-04-17 ENCOUNTER — Telehealth: Payer: Self-pay | Admitting: *Deleted

## 2017-04-17 ENCOUNTER — Other Ambulatory Visit: Payer: Self-pay | Admitting: Acute Care

## 2017-04-17 DIAGNOSIS — Z122 Encounter for screening for malignant neoplasm of respiratory organs: Secondary | ICD-10-CM

## 2017-04-17 DIAGNOSIS — F17210 Nicotine dependence, cigarettes, uncomplicated: Secondary | ICD-10-CM

## 2017-04-17 NOTE — Telephone Encounter (Signed)
Patient called and stated that you had given her Wellbutrin. She stopped it because it was causing Lightheaded and diarrhea. Stated that she does not want a call back and does not want anything else called in. She just wanted to let you know.   Medication added to allergy list. Medication list updated.

## 2017-04-18 NOTE — Congregational Nurse Program (Signed)
Congregational Nurse Program Note  Date of Encounter: 04/13/2017  Past Medical History: Past Medical History:  Diagnosis Date  . Alzheimer disease   . Bilateral pneumonia 01/2017   per new patient packet   . COPD (chronic obstructive pulmonary disease) (HCC) 01/2017   per new patient packet   . Gallbladder disease 2014   per new patient packet   . Hernia, abdominal   . Hypertension   . Liver damage 01/2017   per new patient packet, Dr.Kadakia  . Normal cardiac stress test 2007  . Ovarian cyst 1977   8 1/2 lb left ovarian cyst, Dr.Cox, per new patient packet   . Right ovarian cyst    1992, 1993, 1994, 1995, and 1996 Dr.Neil, per new patient packet     Encounter Details: CNP Questionnaire - 04/18/17 0040      Questionnaire   Patient Status  Not Applicable    Race  White or Caucasian    Location Patient Served At  [facility]    Insurance  Medicaid    Uninsured  Not Applicable    Food  Yes, have food insecurities    Housing/Utilities  No permanent housing    Transportation  Yes, need transportation assistance;Within past 12 months, lack of transportation negatively impacted life    Interpersonal Safety  Yes, feel physically and emotionally safe where you currently live    Medication  No medication insecurities    Medical Provider  Yes    Referrals  Primary Care Provider/Clinic    ED Visit Averted  Not Applicable    Life-Saving Intervention Made  Not Applicable      Clinical Intake - 04/05/17 1431      Pre-visit preparation   Pre-visit preparation completed  No      Pain   Pain   No/denies pain      Nutrition Screen   Nutritional Risks  None    Diabetes  No      Functional Status   Activities of Daily Living  Independent    Ambulation  Independent with device- listed below    Home Assistive Devices/Equipment  Eyeglasses;Dentures (specify type);Oxygen top denture, bottom partial, 2L   top denture, bottom partial, 2L   Medication Administration  Independent     Home Management  Independent      Risk/Barriers   Barriers to Care Management & Learning  None      Abuse/Neglect   Do you feel unsafe in your current relationship?  No    Do you feel physically threatened by others?  No    Anyone hurting you at home, work, or school?  No    Unable to ask?  No    Information provided on Community resources  No      Patient Literacy   How often do you need to have someone help you when you read instructions, pamphlets, or other written materials from your doctor or pharmacy?  1 - Never    What is the last grade level you completed in school?  Some college      Web designerLanguage Assistant   Interpreter Needed?  No      Comments   Information entered by :  Tyron RussellSara Saunders, rN     [facility] team viist . Client was prepared for viist ,had notes from her PCP follow up visit ,states liver test results dropping but still elevated ,referred to GI MD ,given Wellbutrin for depression and smoking will try it. Will see cardiologist 04-18-17 and  will call [facility] for transport,also will have  A  bone density test but no mammogram or colonoscopy )2 humidifier was leaking ,tubing had to be dried out,.recommenmded by [facility] to purchase a room humidifier Client talked about face puffiness,counseled regarding side effects of it. Client has a croupy cough ,counseled and encouraged her to check with pharmacy as to what she can take over the counter,encouraged to act if she gets chills /fever and more congestion as she has COPD. Continues to search for housing ,SW working with client on this .  Client mentioned daughter briefly as she thinks she may be pregnant.. Daughter not present today . Client was able to attend church and was very happy to be able to do that. Client doing fairly well admits to becoming anxious but really feels much better in this environment.  Client has several upcoming appointments next week . Follow weekly

## 2017-04-18 NOTE — Congregational Nurse Program (Signed)
Congregational Nurse Program Note  Date of Encounter: 04/08/2017  Past Medical History: Past Medical History:  Diagnosis Date  . Alzheimer disease   . Bilateral pneumonia 01/2017   per new patient packet   . COPD (chronic obstructive pulmonary disease) (HCC) 01/2017   per new patient packet   . Gallbladder disease 2014   per new patient packet   . Hernia, abdominal   . Hypertension   . Liver damage 01/2017   per new patient packet, Dr.Kadakia  . Normal cardiac stress test 2007  . Ovarian cyst 1977   8 1/2 lb left ovarian cyst, Dr.Cox, per new patient packet   . Right ovarian cyst    1992, 1993, 1994, 1995, and 1996 Dr.Neil, per new patient packet     Encounter Details: CNP Questionnaire - 04/18/17 0028      Questionnaire   Patient Status  Not Applicable    Race  White or Caucasian    Location Patient Served At  [facility]    Insurance  Medicaid    Uninsured  Not Applicable    Food  Yes, have food insecurities    Housing/Utilities  No permanent housing    Transportation  Yes, need transportation assistance;Within past 12 months, lack of transportation negatively impacted life;Provided transportation assistance (bus pass, taxi voucher, etc.)    Interpersonal Safety  Yes, feel physically and emotionally safe where you currently live    Medication  No medication insecurities    Medical Provider  Yes    Referrals  Primary Care Provider/Clinic    ED Visit Averted  Yes    Life-Saving Intervention Made  Not Applicable      Clinical Intake - 04/05/17 1431      Pre-visit preparation   Pre-visit preparation completed  No      Pain   Pain   No/denies pain      Nutrition Screen   Nutritional Risks  None    Diabetes  No      Functional Status   Activities of Daily Living  Independent    Ambulation  Independent with device- listed below    Home Assistive Devices/Equipment  Eyeglasses;Dentures (specify type);Oxygen top denture, bottom partial, 2L   top denture, bottom  partial, 2L   Medication Administration  Independent    Home Management  Independent      Risk/Barriers   Barriers to Care Management & Learning  None      Abuse/Neglect   Do you feel unsafe in your current relationship?  No    Do you feel physically threatened by others?  No    Anyone hurting you at home, work, or school?  No    Unable to ask?  No    Information provided on Community resources  No      Patient Literacy   How often do you need to have someone help you when you read instructions, pamphlets, or other written materials from your doctor or pharmacy?  1 - Never    What is the last grade level you completed in school?  Some college      Web designer Needed?  No      Comments   Information entered by :  Tyron Russell, rN     [facility] team visit ,client moving about and appeared to be doing okay ,no complaints ,still working on housing leads ,Sw gave client more leads.,[facility] had been out and connected her humidifier ,has  several appointments next week ,will follow up with completion on annual exam . Grand daughter over for a visit and client was very engaged and very happy to spend time with her.[facility]  Transport working well. States she had CT of liver and results were fatty liver. Nurse counseled on causes of elevated liver functioning test and a review of her past history to look at causes.Marland Kitchen. Extension given for [facility] program until the end of February as the team is working to find housing.  Follow weekly Will call team if she needs .

## 2017-04-25 NOTE — Congregational Nurse Program (Signed)
Congregational Nurse Program Note  Date of Encounter: 04/20/2017  Past Medical History: Past Medical History:  Diagnosis Date  . Alzheimer disease   . Bilateral pneumonia 01/2017   per new patient packet   . COPD (chronic obstructive pulmonary disease) (HCC) 01/2017   per new patient packet   . Gallbladder disease 2014   per new patient packet   . Hernia, abdominal   . Hypertension   . Liver damage 01/2017   per new patient packet, Dr.Kadakia  . Normal cardiac stress test 2007  . Ovarian cyst 1977   8 1/2 lb left ovarian cyst, Dr.Cox, per new patient packet   . Right ovarian cyst    1992, 1993, 1994, 1995, and 1996 Dr.Neil, per new patient packet     Encounter Details: CNP Questionnaire - 04/25/17 2258      Questionnaire   Patient Status  Not Applicable    Race  White or Caucasian    Location Patient Served At  [facility]    Insurance  Not Applicable;Medicaid    Uninsured  Not Applicable    Food  No food insecurities    Housing/Utilities  No permanent housing    Transportation  Provided transportation assistance (bus pass, taxi voucher, etc.);Yes, need transportation assistance    Interpersonal Safety  Yes, feel physically and emotionally safe where you currently live    Medication  No medication insecurities    Medical Provider  Yes    Referrals  Primary Care Provider/Clinic;Medication Assistance    ED Visit Averted  Not Applicable    Life-Saving Intervention Made  Not Applicable      Clinical Intake - 04/05/17 1431      Pre-visit preparation   Pre-visit preparation completed  No      Pain   Pain   No/denies pain      Nutrition Screen   Nutritional Risks  None    Diabetes  No      Functional Status   Activities of Daily Living  Independent    Ambulation  Independent with device- listed below    Home Assistive Devices/Equipment  Eyeglasses;Dentures (specify type);Oxygen top denture, bottom partial, 2L   top denture, bottom partial, 2L   Medication  Administration  Independent    Home Management  Independent      Risk/Barriers   Barriers to Care Management & Learning  None      Abuse/Neglect   Do you feel unsafe in your current relationship?  No    Do you feel physically threatened by others?  No    Anyone hurting you at home, work, or school?  No    Unable to ask?  No    Information provided on Community resources  No      Patient Literacy   How often do you need to have someone help you when you read instructions, pamphlets, or other written materials from your doctor or pharmacy?  1 - Never    What is the last grade level you completed in school?  Some college      Web designer Needed?  No      Comments   Information entered by :  Tyron Russell, rN     [facility] team  visit found client in a good mood and states she feels fairly good. Had to stop taking Wellbutrin and doesn't want to take any thing else. States she experienced nausea,dizziness,and diarrhea. She said  it wasn't worth it to feel that  way. Other housing options discussed and client to go and look at several options .Client using [facility] and has several appointments coming up.  04-26-17 to see Cardiologist Has been able to attend church for the last  3 sundays and was very happy about that. Cough much better .Information on medical alert call buttons ,[employer] )2 system and smoking reviewed with client Next [facility] team visit 04-27-17

## 2017-05-02 ENCOUNTER — Telehealth: Payer: Self-pay

## 2017-05-02 NOTE — Telephone Encounter (Signed)
TC to client to cancel [facility] team visit for 04-27-17. Client states she feels  better continued to have some nausea from taking Wellbutrin but has stopped taking it.Plans to go out with her sister to look at some apartments as this was part of her plan and goals to find housing . Sw working closely to identify possibilities. Plan to follow up next week . Client okay with plan.

## 2017-05-04 ENCOUNTER — Ambulatory Visit (INDEPENDENT_AMBULATORY_CARE_PROVIDER_SITE_OTHER): Payer: Medicare HMO | Admitting: Internal Medicine

## 2017-05-04 ENCOUNTER — Encounter: Payer: Self-pay | Admitting: Internal Medicine

## 2017-05-04 VITALS — BP 140/90 | HR 97 | Temp 97.7°F | Wt 118.0 lb

## 2017-05-04 DIAGNOSIS — R7401 Elevation of levels of liver transaminase levels: Secondary | ICD-10-CM

## 2017-05-04 DIAGNOSIS — R110 Nausea: Secondary | ICD-10-CM

## 2017-05-04 DIAGNOSIS — R740 Nonspecific elevation of levels of transaminase and lactic acid dehydrogenase [LDH]: Secondary | ICD-10-CM | POA: Diagnosis not present

## 2017-05-04 MED ORDER — PROCHLORPERAZINE MALEATE 5 MG PO TABS: 5.0000 mg | ORAL_TABLET | Freq: Four times a day (QID) | ORAL | 0 refills | Status: DC | PRN

## 2017-05-04 NOTE — Progress Notes (Signed)
Location:  [facility] clinic Provider: Jasman Murri L. Renato Gails, D.O., C.M.D.  Goals of Care:  Advanced Directives 04/05/2017  Does Patient Have a Medical Advance Directive? No  Would patient like information on creating a medical advance directive? Yes (MAU/Ambulatory/Procedural Areas - Information given)   Chief Complaint  Patient presents with  . Acute Visit    nausea, x 2 weeks    HPI: Patient is a 62 y.o. female of Dr. Celene Skeen seen today for an acute visit for nausea.   She stopped her wellbutrin thinking that was the cause--it started when she began the wellbutrin--had diarrhea, vomiting and nausea.  Diarrhea went away.  Still does vomit, but not like she was with the wellbutrin.  It reminds her of when she as pregnant and why she had only one kid.  While eating, she is fine, but as soon as she's done, it returns.  Had two days after wellbutrin stopped when it quit working.   She is still smoking and wearing the patch. Zofran every 8 hrs from cardiology did not help the nausea.  This is the first time that the generic did not work.  Getting no relief whatsoever.   When she was seen by Dr. Montez Morita for her annual physical exam, her liver functions were improving but her AFP liver tumor marker was elevated and GI referral was recommended and placed.  Sees Dr. Russella Dar March 8th.   She returns in a couple weeks for her regular visit.   Promethazine caused severe stomach cramps in the past.   She says she does not drink alcohol to cause the elevated transaminases. Reports some abdominal pain due to a hernia she has.  Has a ton of scar tissue so laparoscopic is not possible. Medical history lists alzheimer's disease, but no memory issues on current problem list and this would be early onset for sure.  Past Medical History:  Diagnosis Date  . Alzheimer disease   . Bilateral pneumonia 01/2017   per new patient packet   . COPD (chronic obstructive pulmonary disease) (HCC) 01/2017   per new patient packet    . Gallbladder disease 2014   per new patient packet   . Hernia, abdominal   . Hypertension   . Liver damage 01/2017   per new patient packet, Dr.Kadakia  . Normal cardiac stress test 2007  . Ovarian cyst 1977   8 1/2 lb left ovarian cyst, Dr.Cox, per new patient packet   . Right ovarian cyst    1992, 1993, 1994, 1995, and 1996 Dr.Neil, per new patient packet     Past Surgical History:  Procedure Laterality Date  . ABDOMINAL HYSTERECTOMY Bilateral    Total  . CESAREAN SECTION    . CHOLECYSTECTOMY    . curretage for endometriosis  93, 94,95, 96  . GYN surgery    . HERNIA REPAIR    . OVARIAN CYST REMOVAL      Allergies  Allergen Reactions  . Amoxicillin Hives     Has patient had a PCN reaction causing immediate rash, facial/tongue/throat swelling, SOB or lightheadedness with hypotension: Yes hives Has patient had a PCN reaction causing severe rash involving mucus membranes or skin necrosis: No Has patient had a PCN reaction that required hospitalization No Has patient had a PCN reaction occurring within the last 10 years: Yes If all of the above answers are "NO", then may proceed with Cephalosporin use.   . Sulfonamide Derivatives Nausea And Vomiting  . Wellbutrin [Bupropion] Other (See Comments)  Lightheaded Diarrhea    Outpatient Encounter Medications as of 05/04/2017  Medication Sig  . albuterol (PROVENTIL HFA;VENTOLIN HFA) 108 (90 Base) MCG/ACT inhaler Inhale 2 puffs into the lungs every 6 (six) hours as needed for wheezing or shortness of breath.  . B Complex-C (B-COMPLEX WITH VITAMIN C) tablet Take 1 tablet by mouth daily.  . budesonide-formoterol (SYMBICORT) 160-4.5 MCG/ACT inhaler Inhale 12 puffs into the lungs daily.   . feeding supplement, ENSURE ENLIVE, (ENSURE ENLIVE) LIQD Take 237 mLs by mouth 2 (two) times daily between meals.  Marland Kitchen. losartan (COZAAR) 50 MG tablet Take 1 tablet (50 mg total) daily by mouth.  . nitroGLYCERIN (NITROSTAT) 0.4 MG SL tablet Place  1 tablet (0.4 mg total) under the tongue every 5 (five) minutes x 3 doses as needed for chest pain (chest pain).  . pantoprazole (PROTONIX) 40 MG tablet Take 1 tablet (40 mg total) by mouth daily.  . potassium chloride (K-DUR) 10 MEQ tablet Take 1 tablet (10 mEq total) by mouth every Monday, Wednesday, and Friday.  . prochlorperazine (COMPAZINE) 5 MG tablet Take 1 tablet (5 mg total) by mouth every 6 (six) hours as needed for refractory nausea / vomiting.  . [DISCONTINUED] nicotine (NICODERM CQ - DOSED IN MG/24 HOURS) 21 mg/24hr patch Place 1 patch (21 mg total) onto the skin daily. (Patient not taking: Reported on 04/11/2017)   No facility-administered encounter medications on file as of 05/04/2017.     Review of Systems:  Review of Systems  Constitutional: Positive for malaise/fatigue. Negative for chills and fever.  HENT: Negative for congestion.   Eyes: Negative for blurred vision.  Respiratory: Negative for shortness of breath.        Ongoing smoking  Cardiovascular: Negative for chest pain and palpitations.  Gastrointestinal: Positive for abdominal pain, nausea and vomiting. Negative for blood in stool, constipation, diarrhea and melena.  Genitourinary: Negative for dysuria.  Musculoskeletal: Negative for falls.  Skin: Negative for itching and rash.  Neurological: Negative for dizziness and weakness.  Psychiatric/Behavioral: Positive for depression.    Health Maintenance  Topic Date Due  . Hepatitis C Screening  1955-09-10  . PAP SMEAR  02/18/1979  . COLONOSCOPY  02/18/2008  . MAMMOGRAM  06/08/2015  . TETANUS/TDAP  03/21/2024  . INFLUENZA VACCINE  Completed  . HIV Screening  Completed    Physical Exam: Vitals:   05/04/17 1138  BP: 140/90  Pulse: 97  Temp: 97.7 F (36.5 C)  TempSrc: Oral  SpO2: 98%  Weight: 118 lb (53.5 kg)   Body mass index is 21.58 kg/m. Physical Exam  Constitutional: She is oriented to person, place, and time. No distress.  Chronically  ill-appearing   Cardiovascular: Normal rate, regular rhythm, normal heart sounds and intact distal pulses.  Pulmonary/Chest: Effort normal and breath sounds normal. No respiratory distress.  Abdominal: Soft. Bowel sounds are normal. She exhibits no distension and no mass. There is tenderness. There is no rebound and no guarding. A hernia is present.  Musculoskeletal: Normal range of motion.  Neurological: She is alert and oriented to person, place, and time.    Labs reviewed: Basic Metabolic Panel: Recent Labs    01/29/17 0515 01/30/17 0450  02/01/17 0437  02/17/17 0313 02/18/17 0845 03/08/17 1231  NA 143 142   < > 138   < > 136 136 137  K 2.7* 5.3*   < > 4.6   < > 2.7* 4.2 4.3  CL 106 110   < > 105   < >  104 104 101  CO2 22 22   < > 25   < > 24 23 27   GLUCOSE 183* 191*   < > 181*   < > 125* 136* 83  BUN 24* 45*   < > 49*   < > 8 11 15   CREATININE 0.75 0.78   < > 0.73   < > 0.66 0.67 0.60  CALCIUM 9.1 9.3   < > 8.7*   < > 8.2* 9.0 9.4  MG 1.9 2.3  --  2.4  --   --   --   --    < > = values in this interval not displayed.   Liver Function Tests: Recent Labs    11/28/16 1434 01/28/17 0514 02/16/17 1829 03/08/17 1231 04/11/17 1534  AST 61* 15 392* 221* 78*  ALT 73* 14 1,330* 335* 126*  ALKPHOS 92 56 162*  --   --   BILITOT 1.4* 0.5 4.8* 1.0  1.0 0.8  PROT 6.1* 6.2* 5.4* 6.4 7.2  ALBUMIN 3.5 3.1* 2.8*  --   --    No results for input(s): LIPASE, AMYLASE in the last 8760 hours. No results for input(s): AMMONIA in the last 8760 hours. CBC: Recent Labs    01/31/17 0418 02/01/17 0437 02/16/17 1829  WBC 17.2* 23.8* 5.9  NEUTROABS  --   --  3.5  HGB 15.6* 14.6 12.0  HCT 45.4 43.2 35.0*  MCV 88.8 89.4 86.8  PLT 322 271 183   Lipid Panel: No results for input(s): CHOL, HDL, LDLCALC, TRIG, CHOLHDL, LDLDIRECT in the last 8760 hours. No results found for: HGBA1C  Procedures since last visit: US Abdomen Limited Ruq  Result Date: 04/05/2017 CLINICAL DATA:  Elevated  liver function tests, hepatomegaly EXAM: ULTRASOUND ABDOMEN LIMITED RIGHT UPPER QUADRANT COMPARISON:  CT abdomen pelvis of 02/28/2013 FINDINGS: Gallbladder: The gallbladder has previously been resected. Common bile duct: Diameter: The common bile duct is normal measuring 2.2 mm in diameter. Liver: The parenchyma of the liver is slightly echogenic suggesting mild fatty infiltration. No focal hepatic abnormality is seen. Portal vein is patent on color Doppler imaging with normal direction of blood flow towards the liver. IMPRESSION: 1. Slightly echogenic liver parenchyma may indicate mild fatty infiltration. No focal abnormality. 2. Prior cholecystectomy. Electronically Signed   By: Dwyane Dee M.D.   On: 04/05/2017 12:32    Assessment/Plan 1. Nausea -may be related to due to liver process--has had abnormal transaminases and now elevated AFP, going to see GI next month -did not respond to zofran and says she cannot tolerate phenergan - prochlorperazine (COMPAZINE) 5 MG tablet; Take 1 tablet (5 mg total) by mouth every 6 (six) hours as needed for refractory nausea / vomiting.  Dispense: 56 tablet; Refill: 0  2. Transaminitis -improved, but now slightly elevated AFP level -pt appears chronically ill  Labs/tests ordered:  No orders of the defined types were placed in this encounter.  Next appt:  05/16/2017  Margurete Guaman L. Audry Kauzlarich, D.O. Geriatrics [facility] [address], [place] [address] Cell Phone (Mon-Fri 8am-5pm):  [phone] On Call:  [phone] & follow prompts after 5pm & weekends Office Phone:  [phone] Office Fax:  [phone]

## 2017-05-04 NOTE — Patient Instructions (Signed)
Nausea, Adult Feeling sick to your stomach (nausea) means that your stomach is upset or you feel like you have to throw up (vomit). Feeling sick to your stomach is usually not serious, but it may be an early sign of a more serious medical problem. As you feel sicker to your stomach, it can lead to throwing up (vomiting). If you throw up, or if you are not able to drink enough fluids, there is a risk of dehydration. Dehydration can make you feel tired and thirsty, have a dry mouth, and pee (urinate) less often. Older adults and people who have other diseases or a weak defense (immune) system have a higher risk of dehydration. The main goal of treating this condition is to:  Limit how often you feel sick to your stomach.  Prevent throwing up and dehydration.  Follow these instructions at home: Follow instructions from your doctor about how to care for yourself at home. Eating and drinking Follow these recommendations as told by your doctor:  Take an oral rehydration solution (ORS). This is a drink that is sold at pharmacies and stores.  Drink clear fluids in small amounts as you are able, such as: ? Water. ? Ice chips. ? Fruit juice that has water added (diluted fruit juice). ? Low-calorie sports drinks.  Eat bland, easy to digest foods in small amounts as you are able, such as: ? Bananas. ? Applesauce. ? Rice. ? Lean meats. ? Toast. ? Crackers.  Avoid drinking fluids that contain a lot of sugar or caffeine.  Avoid alcohol.  Avoid spicy or fatty foods.  General instructions  Drink enough fluid to keep your pee (urine) clear or pale yellow.  Wash your hands often. If you cannot use soap and water, use hand sanitizer.  Make sure that all people in your household wash their hands well and often.  Rest at home while you get better.  Take over-the-counter and prescription medicines only as told by your doctor.  Breathe slowly and deeply when you feel sick to your  stomach.  Watch your condition for any changes.  Keep all follow-up visits as told by your doctor. This is important. Contact a doctor if:  You have a headache.  You have new symptoms.  You feel sicker to your stomach.  You have a fever.  You feel light-headed or dizzy.  You throw up.  You are not able to keep fluids down. Get help right away if:  You have pain in your chest, neck, arm, or jaw.  You feel very weak or you pass out (faint).  You have throw up that is bright red or looks like coffee grounds.  You have bloody or black poop (stools), or poop that looks like tar.  You have a very bad headache, a stiff neck, or both.  You have very bad pain, cramping, or bloating in your belly.  You have a rash.  You have trouble breathing or you are breathing very quickly.  Your heart is beating very quickly.  Your skin feels cold and clammy.  You feel confused.  You have pain while peeing.  You have signs of dehydration, such as: ? Dark pee, or very little or no pee. ? Cracked lips. ? Dry mouth. ? Sunken eyes. ? Sleepiness. ? Weakness. These symptoms may be an emergency. Do not wait to see if the symptoms will go away. Get medical help right away. Call your local emergency services (911 in the [place] Do not drive yourself to   the hospital. This information is not intended to replace advice given to you by your health care provider. Make sure you discuss any questions you have with your health care provider. Document Released: 02/24/2011 Document Revised: 08/13/2015 Document Reviewed: 11/11/2014 Elsevier Interactive Patient Education  2018 Elsevier Inc.  

## 2017-05-04 NOTE — Congregational Nurse Program (Signed)
Congregational Nurse Program Note  Date of Encounter: 05/04/2017  Past Medical History: Past Medical History:  Diagnosis Date  . Alzheimer disease   . Bilateral pneumonia 01/2017   per new patient packet   . COPD (chronic obstructive pulmonary disease) (HCC) 01/2017   per new patient packet   . Gallbladder disease 2014   per new patient packet   . Hernia, abdominal   . Hypertension   . Liver damage 01/2017   per new patient packet, Dr.Kadakia  . Normal cardiac stress test 2007  . Ovarian cyst 1977   8 1/2 lb left ovarian cyst, Dr.Cox, per new patient packet   . Right ovarian cyst    1992, 1993, 1994, 1995, and 1996 Dr.Neil, per new patient packet     Encounter Details: CNP Questionnaire - 05/04/17 2321      Questionnaire   Patient Status  Not Applicable    Race  White or Caucasian    Location Patient Served At  [facility]    Insurance  Medicaid    Uninsured  Not Applicable    Food  No food insecurities    Housing/Utilities  No permanent housing    Transportation  Provided transportation assistance (bus pass, taxi voucher, etc.);Yes, need transportation assistance;Within past 12 months, lack of transportation negatively impacted life    Interpersonal Safety  Yes, feel physically and emotionally safe where you currently live    Medication  No medication insecurities    Medical Provider  Yes    Referrals  Primary Care Provider/Clinic;Area Agency    ED Visit Averted  Not Applicable    Life-Saving Intervention Made  Not Applicable     Joint [facility] team visit to follow up with client. states she continued to be nauseated and some vomiting for about 2 weeks now. Was seen today by MD and was prescribed compazine 5 mg  (prochlorperazine).States if may be her liver causing the nausea ??? Will gastroenterologist   05-15-17  Has several other upcoming appointment s and pulmonary function test scheduled. If nausea continues will call PCP on Monday .  Housing is her major priority  goal and she is to made those visit and narrow down options given by next [facility] visit on 05-10-17  Other than nausea client in good spirits and doing fairly well

## 2017-05-10 NOTE — Congregational Nurse Program (Signed)
Congregational Nurse Program Note  Date of Encounter: 05/10/2017  Past Medical History: Past Medical History:  Diagnosis Date  . Alzheimer disease   . Bilateral pneumonia 01/2017   per new patient packet   . COPD (chronic obstructive pulmonary disease) (HCC) 01/2017   per new patient packet   . Gallbladder disease 2014   per new patient packet   . Hernia, abdominal   . Hypertension   . Liver damage 01/2017   per new patient packet, Dr.Kadakia  . Normal cardiac stress test 2007  . Ovarian cyst 1977   8 1/2 lb left ovarian cyst, Dr.Cox, per new patient packet   . Right ovarian cyst    1992, 1993, 1994, 1995, and 1996 Dr.Neil, per new patient packet     Encounter Details: CNP Questionnaire - 05/10/17 1749      Questionnaire   Patient Status  Not Applicable    Race  White or Caucasian    Location Patient Served At  [facility]    Insurance  Medicaid    Uninsured  Not Applicable    Food  No food insecurities    Housing/Utilities  No permanent housing    Transportation  Provided transportation assistance (bus pass, taxi voucher, etc.);Within past 12 months, lack of transportation negatively impacted life    Interpersonal Safety  Yes, feel physically and emotionally safe where you currently live    Medication  No medication insecurities    Medical Provider  Yes    Referrals  Primary Care Provider/Clinic;Medication Assistance    ED Visit Averted  Not Applicable     [facility] team visit  Today . Stay in program extended until 06-02-17. Reviewed goals for client ,priority housing as client now has a PCP and will return Tuesday for follow up visit.  Client will discuss back pain ,nausea, foot pains  and runny nose with her PCP. Client continues to state her nausea continues and the medication has really helped but she is taking it every 6 hours or the nausea comes back ,will discuss with PCP to determine cause.  Housing options that  Have been presented to client did not meet her  criteria  For safety . Other options given and  SW will pull together other options. .Daughter present today  During visit.   Importance of identifying housing reviewed with client . [facility] transportation working well for client. Client also to secure extra tubing for her O2 machine.  Next visit  05-17-17

## 2017-05-11 ENCOUNTER — Telehealth: Payer: Self-pay | Admitting: Internal Medicine

## 2017-05-11 NOTE — Telephone Encounter (Signed)
Please advise; patient called about a burning sensation on bottom of her feet, stated it started last Saturday and she does have an upcoming appointment on 05/16/2017 but she wanted to know what can she do until her appt b/c she can not wear shoes   Patient was offered an appointment to come in sooner but declined due to transportation issues and stated she wanted to see Dr Montez Moritaarter

## 2017-05-11 NOTE — Telephone Encounter (Signed)
She has no medical diagnosis that could cause burning in her feet. She needs to be seen prior to medications ordered. She could get some diabetic socks and see if that helps. Otherwise, she may need to go to urgent care if it cannot wait until her appointment

## 2017-05-11 NOTE — Telephone Encounter (Signed)
Patient notified and agreed.  

## 2017-05-16 ENCOUNTER — Ambulatory Visit (INDEPENDENT_AMBULATORY_CARE_PROVIDER_SITE_OTHER): Payer: Medicare HMO | Admitting: Internal Medicine

## 2017-05-16 ENCOUNTER — Encounter: Payer: Self-pay | Admitting: Internal Medicine

## 2017-05-16 VITALS — BP 130/78 | HR 98 | Temp 98.1°F | Resp 10 | Ht 62.0 in | Wt 110.8 lb

## 2017-05-16 DIAGNOSIS — F322 Major depressive disorder, single episode, severe without psychotic features: Secondary | ICD-10-CM

## 2017-05-16 DIAGNOSIS — R110 Nausea: Secondary | ICD-10-CM

## 2017-05-16 DIAGNOSIS — R740 Nonspecific elevation of levels of transaminase and lactic acid dehydrogenase [LDH]: Secondary | ICD-10-CM

## 2017-05-16 DIAGNOSIS — R739 Hyperglycemia, unspecified: Secondary | ICD-10-CM

## 2017-05-16 DIAGNOSIS — Z720 Tobacco use: Secondary | ICD-10-CM | POA: Diagnosis not present

## 2017-05-16 DIAGNOSIS — G8929 Other chronic pain: Secondary | ICD-10-CM

## 2017-05-16 DIAGNOSIS — M545 Low back pain, unspecified: Secondary | ICD-10-CM

## 2017-05-16 DIAGNOSIS — R7401 Elevation of levels of liver transaminase levels: Secondary | ICD-10-CM

## 2017-05-16 DIAGNOSIS — G609 Hereditary and idiopathic neuropathy, unspecified: Secondary | ICD-10-CM

## 2017-05-16 MED ORDER — GABAPENTIN 100 MG PO CAPS: ORAL_CAPSULE | ORAL | 1 refills | Status: DC

## 2017-05-16 NOTE — Progress Notes (Signed)
Patient ID: Brittany [name], female   DOB: [birth year], 62 y.o.   MRN: [id number]   Location:  [facility] OFFICE  Provider: DR Elmon Kirschner  Code Status:  Goals of Care:  Advanced Directives 04/05/2017  Does Patient Have a Medical Advance Directive? No  Would patient like information on creating a medical advance directive? Yes (MAU/Ambulatory/Procedural Areas - Information given)     Chief Complaint  Patient presents with  . Follow-up    1 month follow-up on depression   . Foot Problem    Patient c/o bilateral foot concerns- constant burning sensation and odor x 10 days   . Health Maintenance    Discuss need for Mammogram, Colonoscopy, Hep C screening, and pap smear     HPI: Patient is a 62 y.o. female seen today for f/u depression. She stopped wellbutrin 2/2 N/V/diarrhea. Diarrhea resolved after stopping wellbutrin. Her sx's began within 48-72 hrs of beginning medication. She continues to have nausea which has improved with prn compazine. She has an appt with GI Dr Russella Dar on March 8th. She has elevated AFP and LFTs. She smokes approx 4 cigs per day.  She c/o 10 day hx of b/l burning sensation of feet. Pain improves when feet placed on cool floor. No hx DM. Her sister and paternal GM with hx DM. She has chronic LBP. No loss of bowel/bladder control.   She continues to live in a motel. Her daughter moved out.  Past Medical History:  Diagnosis Date  . Alzheimer disease   . Bilateral pneumonia 01/2017   per new patient packet   . COPD (chronic obstructive pulmonary disease) (HCC) 01/2017   per new patient packet   . Gallbladder disease 2014   per new patient packet   . Hernia, abdominal   . Hypertension   . Liver damage 01/2017   per new patient packet, Dr.Kadakia  . Normal cardiac stress test 2007  . Ovarian cyst 1977   8 1/2 lb left ovarian cyst, Dr.Cox, per new patient packet   . Right ovarian cyst    1992, 1993, 1994, 1995, and 1996 Dr.Neil, per new patient packet     Past  Surgical History:  Procedure Laterality Date  . ABDOMINAL HYSTERECTOMY Bilateral    Total  . CESAREAN SECTION    . CHOLECYSTECTOMY    . curretage for endometriosis  93, 94,95, 96  . GYN surgery    . HERNIA REPAIR    . OVARIAN CYST REMOVAL       reports that she has been smoking cigarettes.  She has a 1000.00 pack-year smoking history. she has never used smokeless tobacco. She reports that she does not drink alcohol or use drugs. Social History   Socioeconomic History  . Marital status: Divorced    Spouse name: Not on file  . Number of children: Not on file  . Years of education: Not on file  . Highest education level: Not on file  Social Needs  . Financial resource strain: Not on file  . Food insecurity - worry: Not on file  . Food insecurity - inability: Not on file  . Transportation needs - medical: Not on file  . Transportation needs - non-medical: Not on file  Occupational History  . Not on file  Tobacco Use  . Smoking status: Current Every Day Smoker    Packs/day: 25.00    Years: 40.00    Pack years: 1000.00    Types: Cigarettes  . Smokeless tobacco: Never Used  .  Tobacco comment: Takes 2-3 puffs when smoking, 4 cigarettes a day   Substance and Sexual Activity  . Alcohol use: No    Frequency: Never  . Drug use: No  . Sexual activity: No    Comment: disabled, `1 yr college, married.   Other Topics Concern  . 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 No   Do you have difficulty preparing food or  eating? No   Do you have difficulty managing your medications? No   Do you have difficulty managing your finances? No   Do you have difficulty affording your medications? Yes Inhalers     Family History  Problem Relation Age of Onset  . Cancer Mother   . Stroke Mother   . COPD Mother   . Dementia Mother   . Diabetes Father   . Heart attack Father   . Diabetes Sister   . Stroke Sister        x2  . Hepatitis C Daughter     Allergies  Allergen Reactions  . Amoxicillin Hives     Has patient had a PCN reaction causing immediate rash, facial/tongue/throat swelling, SOB or lightheadedness with hypotension: Yes hives Has patient had a PCN reaction causing severe rash involving mucus membranes or skin necrosis: No Has patient had a PCN reaction that required hospitalization No Has patient had a PCN reaction occurring within the last 10 years: Yes If all of the above answers are "NO", then may proceed with Cephalosporin use.   . Sulfonamide Derivatives Nausea And Vomiting  . Wellbutrin [Bupropion] Other (See Comments)    Lightheaded Diarrhea    Outpatient Encounter Medications as of 05/16/2017  Medication Sig  . albuterol (PROVENTIL HFA;VENTOLIN HFA) 108 (90 Base) MCG/ACT inhaler Inhale 2 puffs into the lungs every 6 (six) hours as needed for wheezing or shortness of breath.  . B Complex-C (B-COMPLEX WITH VITAMIN C) tablet Take 1 tablet by mouth daily.  . budesonide-formoterol (SYMBICORT) 160-4.5 MCG/ACT inhaler Inhale 12 puffs into the lungs daily.   . feeding supplement, ENSURE ENLIVE, (ENSURE ENLIVE) LIQD Take 237 mLs by mouth 2 (two) times daily between meals.  Marland Kitchen. losartan (COZAAR) 50 MG tablet Take 1 tablet (50 mg total) daily by mouth.  . nitroGLYCERIN (NITROSTAT) 0.4 MG SL tablet Place 1 tablet (0.4 mg total) under the tongue every 5 (five) minutes x 3 doses as needed for chest pain (chest pain).  . pantoprazole (PROTONIX) 40 MG tablet Take 1 tablet (40 mg total) by mouth  daily.  . potassium chloride (K-DUR) 10 MEQ tablet Take 1 tablet (10 mEq total) by mouth every Monday, Wednesday, and Friday.  . prochlorperazine (COMPAZINE) 5 MG tablet Take 1 tablet (5 mg total) by mouth every 6 (six) hours as needed for refractory nausea / vomiting.   No facility-administered encounter  medications on file as of 05/16/2017.     Review of Systems:  Review of Systems  Gastrointestinal: Positive for nausea. Negative for diarrhea.  Neurological: Positive for numbness.  All other systems reviewed and are negative.   Health Maintenance  Topic Date Due  . Hepatitis C Screening  [birth year]  . PAP SMEAR  02/18/1979  . COLONOSCOPY  02/18/2008  . MAMMOGRAM  06/08/2015  . TETANUS/TDAP  03/21/2024  . INFLUENZA VACCINE  Completed  . HIV Screening  Completed    Physical Exam: Vitals:   05/16/17 1344  BP: 130/78  Pulse: 98  Resp: 10  Temp: 98.1 F (36.7 C)  TempSrc: Oral  SpO2: 99%  Weight: 110 lb 12.8 oz (50.3 kg)  Height: 5\' 2"  (1.575 m)   Body mass index is 20.27 kg/m. Physical Exam  Constitutional: She is oriented to person, place, and time. She appears well-developed and well-nourished.  HENT:  Mouth/Throat: Oropharynx is clear and moist. No oropharyngeal exudate.  MMM; no oral thrush  Eyes: Pupils are equal, round, and reactive to light. No scleral icterus.  Neck: Neck supple. Carotid bruit is not present. No tracheal deviation present. No thyromegaly present.  Cardiovascular: Normal rate, regular rhythm and intact distal pulses. Exam reveals no gallop and no friction rub.  Murmur (1/6 SEM) heard. No LE edema b/l. no calf TTP.   Pulmonary/Chest: Effort normal and breath sounds normal. No stridor. No respiratory distress. She has no wheezes. She has no rales.  Abdominal: Soft. Normal appearance and bowel sounds are normal. She exhibits no distension and no mass. There is hepatomegaly. There is generalized tenderness. There is no rigidity, no rebound and no  guarding. No hernia.  Musculoskeletal: She exhibits edema.  Lymphadenopathy:    She has no cervical adenopathy.  Neurological: She is alert and oriented to person, place, and time.  Monofilament testing intact b/l; no foot lesions  Skin: Skin is warm and dry. No rash noted.  Psychiatric: She has a normal mood and affect. Her behavior is normal. Judgment and thought content normal.    Labs reviewed: Basic Metabolic Panel: Recent Labs    01/29/17 0515 01/30/17 0450  02/01/17 0437  02/17/17 0313 02/18/17 0845 03/08/17 1231  NA 143 142   < > 138   < > 136 136 137  K 2.7* 5.3*   < > 4.6   < > 2.7* 4.2 4.3  CL 106 110   < > 105   < > 104 104 101  CO2 22 22   < > 25   < > 24 23 27   GLUCOSE 183* 191*   < > 181*   < > 125* 136* 83  BUN 24* 45*   < > 49*   < > 8 11 15   CREATININE 0.75 0.78   < > 0.73   < > 0.66 0.67 0.60  CALCIUM 9.1 9.3   < > 8.7*   < > 8.2* 9.0 9.4  MG 1.9 2.3  --  2.4  --   --   --   --    < > = values in this interval not displayed.   Liver Function Tests: Recent Labs    11/28/16 1434 01/28/17 0514 02/16/17 1829 03/08/17 1231 04/11/17 1534  AST 61* 15 392* 221* 78*  ALT 73* 14 1,330* 335* 126*  ALKPHOS 92 56 162*  --   --   BILITOT 1.4* 0.5 4.8* 1.0  1.0 0.8  PROT 6.1* 6.2* 5.4* 6.4 7.2  ALBUMIN 3.5 3.1* 2.8*  --   --    No results for input(s): LIPASE, AMYLASE in the last 8760 hours. No results for input(s): AMMONIA in the last 8760 hours. CBC: Recent Labs    01/31/17 0418 02/01/17 0437 02/16/17 1829  WBC 17.2* 23.8* 5.9  NEUTROABS  --   --  3.5  HGB 15.6* 14.6 12.0  HCT 45.4 43.2 35.0*  MCV 88.8 89.4 86.8  PLT 322 271 183   Lipid Panel: No results for input(s): CHOL, HDL, LDLCALC, TRIG, CHOLHDL, LDLDIRECT in the last 8760 hours. No results found for: HGBA1C  Procedures since last visit: No results found.  Assessment/Plan   ICD-10-CM   1. Idiopathic peripheral neuropathy G60.9 gabapentin (NEURONTIN) 100 MG capsule    Hemoglobin A1c    2. Hyperglycemia R73.9 Hemoglobin A1c  3. Nausea R11.0   4. Transaminitis R74.0   5. Tobacco abuse Z72.0   6. Chronic low back pain without sciatica, unspecified back pain laterality M54.5 DG Lumbar Spine Complete   G89.29   7. Current severe episode of major depressive disorder without psychotic features, unspecified whether recurrent (HCC) F32.2    START GABAPENTIN 100MG  _ TAKE 1 CAPSULE AT BEDTIME DAILY X 7 NIGHTS THEN INCREASE EVERY WEEK BY 1 CAPSULE TO TOTAL OF 3 CAPS AT BEDTIME (300MG )  Follow up with specialists as scheduled  Will call with lab results  Follow up 1 month for neuropathy, back pain, nausea, elevated AFP and ALTs     Shenandoah Yeats S. Ancil Linsey  [facility] [address] [place], [place] [address] [phone] Cell (Monday-Friday 8 AM - 5 PM) [phone] After 5 PM and follow prompts

## 2017-05-16 NOTE — Patient Instructions (Signed)
START GABAPENTIN 100MG  _ TAKE 1 CAPSULE AT BEDTIME DAILY X 7 NIGHTS THEN INCREASE EVERY WEEK BY 1 CAPSULE TO TOTAL OF 3 CAPS AT BEDTIME (300MG )  Follow up with specialists as scheduled  Will call with lab results  Follow up 1 month for neuropathy, back pain, nausea, elevated AFP and ALTs

## 2017-05-17 LAB — HEMOGLOBIN A1C
EAG (MMOL/L): 6.3 (calc)
HEMOGLOBIN A1C: 5.6 %{Hb} (ref <5.7)
MEAN PLASMA GLUCOSE: 114 (calc)

## 2017-05-18 ENCOUNTER — Encounter: Payer: Self-pay | Admitting: Internal Medicine

## 2017-05-19 DIAGNOSIS — R739 Hyperglycemia, unspecified: Secondary | ICD-10-CM | POA: Insufficient documentation

## 2017-05-19 DIAGNOSIS — G609 Hereditary and idiopathic neuropathy, unspecified: Secondary | ICD-10-CM | POA: Insufficient documentation

## 2017-05-19 DIAGNOSIS — G8929 Other chronic pain: Secondary | ICD-10-CM | POA: Insufficient documentation

## 2017-05-19 DIAGNOSIS — M545 Low back pain, unspecified: Secondary | ICD-10-CM | POA: Insufficient documentation

## 2017-05-23 NOTE — Congregational Nurse Program (Signed)
Congregational Nurse Program Note  Date of Encounter: 05/17/2017  Past Medical History: Past Medical History:  Diagnosis Date  . Alzheimer disease   . Bilateral pneumonia 01/2017   per new patient packet   . COPD (chronic obstructive pulmonary disease) (HCC) 01/2017   per new patient packet   . Gallbladder disease 2014   per new patient packet   . Hernia, abdominal   . Hypertension   . Liver damage 01/2017   per new patient packet, Dr.Kadakia  . Normal cardiac stress test 2007  . Ovarian cyst 1977   8 1/2 lb left ovarian cyst, Dr.Cox, per new patient packet   . Right ovarian cyst    1992, 1993, 1994, 1995, and 1996 Dr.Neil, per new patient packet     Encounter Details: CNP Questionnaire - 05/23/17 1821      Questionnaire   Patient Status  Not Applicable    Race  White or Caucasian    Location Patient Served At  [facility] patient    Insurance  Medicaid    Uninsured  Not Applicable    Food  No food insecurities    Housing/Utilities  No permanent housing    Transportation  Yes, need transportation assistance;Within past 12 months, lack of transportation negatively impacted life;Provided transportation assistance (bus pass, taxi voucher, etc.)    Interpersonal Safety  Yes, feel physically and emotionally safe where you currently live    Medication  Yes, have medication insecurities    Medical Provider  Yes    Referrals  Primary Care Provider/Clinic    ED Visit Averted  Not Applicable    Life-Saving Intervention Made  Not Applicable     [facility] team follow up visit . Client had been seen by her PCP and had been given medication for her foot burning and was hoping for relief. Continues to have nausea but states somewhat better if she takes the medication States she doesn't have diabetes ,back pains will be scheduled for x-ray . Followed up on leads for housing  but without success ,will continue the search with new leads.  At MD visit B/P was 130/78 and sahe weighed 110 lbs.   Next  visit 05-25-17

## 2017-05-26 ENCOUNTER — Encounter: Payer: Self-pay | Admitting: Gastroenterology

## 2017-05-26 ENCOUNTER — Other Ambulatory Visit (INDEPENDENT_AMBULATORY_CARE_PROVIDER_SITE_OTHER): Payer: Medicare HMO

## 2017-05-26 ENCOUNTER — Ambulatory Visit: Payer: Medicare HMO | Admitting: Gastroenterology

## 2017-05-26 VITALS — BP 146/80 | HR 96 | Ht 61.5 in | Wt 115.2 lb

## 2017-05-26 DIAGNOSIS — R945 Abnormal results of liver function studies: Secondary | ICD-10-CM

## 2017-05-26 DIAGNOSIS — Z1212 Encounter for screening for malignant neoplasm of rectum: Secondary | ICD-10-CM | POA: Diagnosis not present

## 2017-05-26 DIAGNOSIS — R7989 Other specified abnormal findings of blood chemistry: Secondary | ICD-10-CM

## 2017-05-26 DIAGNOSIS — R772 Abnormality of alphafetoprotein: Secondary | ICD-10-CM | POA: Diagnosis not present

## 2017-05-26 DIAGNOSIS — Z1211 Encounter for screening for malignant neoplasm of colon: Secondary | ICD-10-CM

## 2017-05-26 LAB — HEPATIC FUNCTION PANEL
ALK PHOS: 59 U/L (ref 39–117)
ALT: 130 U/L — AB (ref 0–35)
AST: 104 U/L — AB (ref 0–37)
Albumin: 4.3 g/dL (ref 3.5–5.2)
BILIRUBIN TOTAL: 0.6 mg/dL (ref 0.2–1.2)
Bilirubin, Direct: 0.1 mg/dL (ref 0.0–0.3)
TOTAL PROTEIN: 7.3 g/dL (ref 6.0–8.3)

## 2017-05-26 NOTE — Patient Instructions (Signed)
Your physician has requested that you go to the basement for lab work before leaving today.  Normal BMI (Body Mass Index- based on height and weight) is between 19 and 25. Your BMI today is Body mass index is 21.42 kg/m. Marland Kitchen. Please consider follow up  regarding your BMI with your Primary Care Provider.  Thank you for choosing me and [facility].  Venita LickMalcolm T. Pleas KochStark, Jr., MD., Clementeen GrahamFACG

## 2017-05-26 NOTE — Progress Notes (Addendum)
History of Present Illness: This is a 62 year old female referred by Gildardo Cranker, DO for the evaluation of elevated LFTs.  Records reviewed show LFTs prior to September 2018 were normal. Sept 2018: AST=61, ALT=73, t bili=1.4, rest normal Nov 2018: AST=392, ALT=1,330, alk phos=162, t bili=4.8.   Jan 2019: AST=78, ALT=126, rest normal.  AFP=6.7  She underwent cholecystectomy in 2004.  She has no history of liver disorders.  There is no family history of liver disease.  She was hospitalized for a COPD and PNA twice in Nov 2018. Levaquin for CAP in Nov 2018 prior to marked LFT elevation.   Denies weight loss, abdominal pain, constipation, diarrhea, change in stool caliber, melena, hematochezia, nausea, vomiting, dysphagia, reflux symptoms, chest pain.  RUQ [place] 03/2017 IMPRESSION: 1. Slightly echogenic liver parenchyma may indicate mild fatty infiltration. No focal abnormality. 2. Prior cholecystectomy.  Allergies  Allergen Reactions  . Amoxicillin Hives     Has patient had a PCN reaction causing immediate rash, facial/tongue/throat swelling, SOB or lightheadedness with hypotension: Yes hives Has patient had a PCN reaction causing severe rash involving mucus membranes or skin necrosis: No Has patient had a PCN reaction that required hospitalization No Has patient had a PCN reaction occurring within the last 10 years: Yes If all of the above answers are "NO", then may proceed with Cephalosporin use.   . Sulfonamide Derivatives Nausea And Vomiting  . Wellbutrin [Bupropion] Other (See Comments)    Lightheaded Diarrhea   Outpatient Medications Prior to Visit  Medication Sig Dispense Refill  . albuterol (PROVENTIL HFA;VENTOLIN HFA) 108 (90 Base) MCG/ACT inhaler Inhale 2 puffs into the lungs every 6 (six) hours as needed for wheezing or shortness of breath.    . B Complex-C (B-COMPLEX WITH VITAMIN C) tablet Take 1 tablet by mouth daily.    . budesonide-formoterol (SYMBICORT) 160-4.5  MCG/ACT inhaler Inhale 12 puffs into the lungs daily.     . feeding supplement, ENSURE ENLIVE, (ENSURE ENLIVE) LIQD Take 237 mLs by mouth 2 (two) times daily between meals. 60 Bottle 6  . gabapentin (NEURONTIN) 100 MG capsule Take 1 cap po qhs x 7 days then increase to 2 caps po qhs x 7 days then increase to 3 caps po qhs for nerve pain 90 capsule 1  . losartan (COZAAR) 50 MG tablet Take 1 tablet (50 mg total) daily by mouth. 30 tablet 1  . pantoprazole (PROTONIX) 40 MG tablet Take 1 tablet (40 mg total) by mouth daily. 30 tablet 3  . potassium chloride (K-DUR) 10 MEQ tablet Take 1 tablet (10 mEq total) by mouth every Monday, Wednesday, and Friday. 30 tablet 3  . prochlorperazine (COMPAZINE) 5 MG tablet Take 1 tablet (5 mg total) by mouth every 6 (six) hours as needed for refractory nausea / vomiting. 56 tablet 0  . nitroGLYCERIN (NITROSTAT) 0.4 MG SL tablet Place 1 tablet (0.4 mg total) under the tongue every 5 (five) minutes x 3 doses as needed for chest pain (chest pain). (Patient not taking: Reported on 05/26/2017) 25 tablet 1   No facility-administered medications prior to visit.    Past Medical History:  Diagnosis Date  . Alzheimer disease   . Anxiety   . Bilateral pneumonia 01/2017   per new patient packet   . COPD (chronic obstructive pulmonary disease) ([place]) 01/2017   per new patient packet   . Depression   . Fatty liver   . Gallbladder disease 2014   per new patient packet   .  Hernia, abdominal   . Hypertension   . Liver damage 01/2017   per new patient packet, Dr.Kadakia  . Normal cardiac stress test 2007  . Ovarian cyst 1977   8 1/2 lb left ovarian cyst, Dr.Cox, per new patient packet   . Pneumonia   . Right ovarian cyst    1992, 1993, 1994, 1995, and 1996 Dr.Neil, per new patient packet   . Stroke [facility])    Past Surgical History:  Procedure Laterality Date  . ABDOMINAL HYSTERECTOMY Bilateral    Total  . CESAREAN SECTION    . CHOLECYSTECTOMY    . INGUINAL HERNIA  REPAIR Right   . LEFT OOPHORECTOMY  1977   with cyst removal, age 27  . OVARIAN CYST REMOVAL Right 93, 78,24, 96  . UMBILICAL HERNIA REPAIR     Social History   Socioeconomic History  . Marital status: Divorced    Spouse name: None  . Number of children: 1  . Years of education: None  . Highest education level: None  Social Needs  . Financial resource strain: None  . Food insecurity - worry: None  . Food insecurity - inability: None  . Transportation needs - medical: None  . Transportation needs - non-medical: None  Occupational History  . Occupation: disabled  Tobacco Use  . Smoking status: Current Every Day Smoker    Packs/day: 25.00    Years: 40.00    Pack years: 1000.00    Types: Cigarettes  . Smokeless tobacco: Never Used  . Tobacco comment: Takes 2-3 puffs when smoking, 4 cigarettes a day   Substance and Sexual Activity  . Alcohol use: No    Frequency: Never  . Drug use: No  . Sexual activity: No    Comment: disabled, `1 yr college, married.   Other Topics Concern  . 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 No   Do you have difficulty preparing food or eating? No   Do you have difficulty managing your medications? No   Do you have difficulty managing your finances? No   Do you have difficulty affording your medications? Yes Inhalers    Family History  Problem Relation Age of Onset  . Stroke Mother   . COPD Mother   . Dementia Mother   . Breast cancer Mother   . Diabetes Father   . Heart attack  Father   . Diabetes Sister   . Stroke Sister        x2  . Hepatitis C Daughter   . Diabetes Paternal Grandmother       Review of Systems: Pertinent positive and negative review of systems were noted in the above HPI section. All other review of systems were otherwise negative.    Physical Exam: General: Well developed, well nourished, no acute distress Head: Normocephalic and atraumatic Eyes:  sclerae anicteric, EOMI Ears: Normal auditory acuity Mouth: No deformity or lesions Neck: Supple, no masses or thyromegaly Lungs: Clear throughout to auscultation Heart: Regular rate and rhythm; no murmurs, rubs or bruits Abdomen: Soft, non tender and non distended. No masses, hepatosplenomegaly or hernias noted. Normal Bowel sounds Rectal: not done Musculoskeletal: Symmetrical with no gross deformities  Skin: No lesions on visible extremities Pulses:  Normal pulses noted Extremities: No clubbing, cyanosis, edema or deformities noted Neurological: Alert oriented x 4, grossly nonfocal Cervical Nodes:  No significant cervical adenopathy Inguinal Nodes: No significant inguinal adenopathy Psychological:  Alert and cooperative. Normal mood and affect  Assessment and Recommendations:  1. Elevated LFTs that have substantially improved. Acute hepatic insult which is resolving. R/O viral hepatitis, reactive hepatic insult, DILI.  Minimally elevated AFP without evidence of cirrhosis or liver lesions on ultrasound.  Mildly echogenic hepatic parenchyma could indicate hepatic steatosis.  Repeat LFTs today.  Acute hepatitis panel today.  Repeat LFTs and AFP in 2-3 months.  2.  Discussed colonoscopy and patient states she had a difficult time with pain with a prior colonoscopy over 20 years ago and she declines colonoscopy.  PCP to discuss alternative CRC screening strategies such as Cologuard or annual FIT.  CRC screening average risk.    cc: Gildardo Cranker, DO [place], [place]  [phone]

## 2017-05-29 ENCOUNTER — Other Ambulatory Visit: Payer: Self-pay

## 2017-05-29 DIAGNOSIS — R7989 Other specified abnormal findings of blood chemistry: Secondary | ICD-10-CM

## 2017-05-29 DIAGNOSIS — R945 Abnormal results of liver function studies: Principal | ICD-10-CM

## 2017-05-30 ENCOUNTER — Telehealth: Payer: Self-pay

## 2017-05-30 NOTE — Telephone Encounter (Signed)
Client answered the phone and took the two housing leads given to her . Thanked the nurse stated she was doing okay still some nausea but better.   Will follow up on next [facility] visit scheduled for 05-25-17

## 2017-05-30 NOTE — Telephone Encounter (Signed)
[facility] team visit rescheduled  And  SW will let client know  Planned visit for 12:30 on 05-26-17

## 2017-05-30 NOTE — Telephone Encounter (Signed)
SW arrived at clients place of stay to find her okay and preparing to go to her  MD visit to see Gastroenterologist . She is fine daughter has her phone but client didn't answer room phone . Team was worried and Sw went over . Things are fine will reschedule  visit by team for 06-01-17  At 10:45 am. Nurse will request extension in [facility] as 3-15 will be next week and housing still an issue for client . Client will follow on leads and SW assisting.

## 2017-05-31 NOTE — Congregational Nurse Program (Signed)
Congregational Nurse Program Note  Date of Encounter: 05/31/2017  Past Medical History: Past Medical History:  Diagnosis Date  . Alzheimer disease   . Anxiety   . Bilateral pneumonia 01/2017   per new patient packet   . COPD (chronic obstructive pulmonary disease) (HCC) 01/2017   per new patient packet   . Depression   . Fatty liver   . Gallbladder disease 2014   per new patient packet   . Hernia, abdominal   . Hypertension   . Liver damage 01/2017   per new patient packet, Dr.Kadakia  . Normal cardiac stress test 2007  . Ovarian cyst 1977   8 1/2 lb left ovarian cyst, Dr.Cox, per new patient packet   . Pneumonia   . Right ovarian cyst    1992, 1993, 1994, 1995, and 1996 Dr.Neil, per new patient packet   . Stroke [facility](HCC)     Encounter Details: CNP Questionnaire - 05/31/17 1544      Questionnaire   Patient Status  Not Applicable    Race  White or Caucasian    Location Patient Served At  [facility] patient    Insurance  Medicaid    Uninsured  Not Applicable    Food  No food insecurities    Housing/Utilities  No permanent housing    Transportation  Yes, need transportation assistance;Within past 12 months, lack of transportation negatively impacted life    Interpersonal Safety  Yes, feel physically and emotionally safe where you currently live    Medication  No medication insecurities    Medical Provider  Yes    Referrals  Primary Care Provider/Clinic    ED Visit Averted  Not Applicable    Life-Saving Intervention Made  Not Applicable     [facility] team visit today found client in her room and feeling fairly well. Still complaints of nausea,back and foot pains .Seen last Friday by Gastroenterologist ,client states things went well and not sure what caused her LTF to go way up but values are coming down . Was questioned regarding her medications while in hospital but still not sure what has caused her LFT to go up. Counseled again on certain causes . Record review by nurse revealed  her examine was fairly okay,mild fatty liver ,no focal abnormality . Client was happy that results didn't indicate a more serious problem..Client states she was nauseated this am on;ly had 1/2 cup of coffee and some bread ,cautioned on not eating and had some diarrhea ?? Counseled regarding dehydration and the body's needs for nutrients. Client will need to let her PCP know if things linger or get worst. Daughter out ? looking for a job ?? Has mothers cell phone.  Housing a major issue for client ,leads have been unsuccessful ,still looking nurse and SW.Client states she has a lead from a friend of her daughters will know something this week . Nurse has ask for an extension in [facility]  program as client has no place to go ,left a situation where she was depressed was being being manipulated and caused her extreme anxiety and health problems . Client now is safe ,has her a regular PCP ,feels secure and  her depression /anxiety have improved tremendously..Team will continue to look for housing leads that client can afford.and follow weekly

## 2017-05-31 NOTE — Congregational Nurse Program (Deleted)
Congregational Nurse Program Note  Date of Encounter: 04/05/2017  Past Medical History: Past Medical History:  Diagnosis Date  . Alzheimer disease   . Anxiety   . Bilateral pneumonia 01/2017   per new patient packet   . COPD (chronic obstructive pulmonary disease) (HCC) 01/2017   per new patient packet   . Depression   . Fatty liver   . Gallbladder disease 2014   per new patient packet   . Hernia, abdominal   . Hypertension   . Liver damage 01/2017   per new patient packet, Dr.Kadakia  . Normal cardiac stress test 2007  . Ovarian cyst 1977   8 1/2 lb left ovarian cyst, Dr.Cox, per new patient packet   . Pneumonia   . Right ovarian cyst    1992, 1993, 1994, 1995, and 1996 Dr.Neil, per new patient packet   . Stroke [facility](HCC)     Encounter Details: CNP Questionnaire - 05/31/17 1741      Questionnaire   Patient Status  Not Applicable    Race  White or Caucasian    Location Patient Served At  [facility]    Insurance  Medicaid    Uninsured  Not Applicable    Food  No food insecurities    Housing/Utilities  No permanent housing    Transportation  Yes, need transportation assistance;Within past 12 months, lack of transportation negatively impacted life    Interpersonal Safety  Yes, feel physically and emotionally safe where you currently live    Medication  No medication insecurities    Medical Provider  Yes    Referrals  Primary Care Provider/Clinic    ED Visit Averted  Not Applicable    Life-Saving Intervention Made  Not Applicable

## 2017-06-01 LAB — HCV RNA,QUANTITATIVE REAL TIME PCR
HCV Quantitative Log: 7.08 Log IU/mL — ABNORMAL HIGH
HCV RNA, PCR, QN: 11900000 IU/mL — ABNORMAL HIGH

## 2017-06-01 LAB — HEPATITIS PANEL, ACUTE
Hep A IgM: NONREACTIVE
Hep B C IgM: NONREACTIVE
Hepatitis B Surface Ag: NONREACTIVE
Hepatitis C Ab: REACTIVE — AB
SIGNAL TO CUT-OFF: 33 — ABNORMAL HIGH (ref <1.00)

## 2017-06-05 ENCOUNTER — Other Ambulatory Visit: Payer: Self-pay

## 2017-06-05 DIAGNOSIS — B1920 Unspecified viral hepatitis C without hepatic coma: Secondary | ICD-10-CM

## 2017-06-12 ENCOUNTER — Ambulatory Visit (INDEPENDENT_AMBULATORY_CARE_PROVIDER_SITE_OTHER): Payer: Medicare HMO | Admitting: Acute Care

## 2017-06-12 ENCOUNTER — Encounter: Payer: Self-pay | Admitting: Acute Care

## 2017-06-12 ENCOUNTER — Ambulatory Visit: Payer: Medicare HMO | Admitting: Pulmonary Disease

## 2017-06-12 ENCOUNTER — Ambulatory Visit (INDEPENDENT_AMBULATORY_CARE_PROVIDER_SITE_OTHER): Payer: Medicare HMO | Admitting: Pulmonary Disease

## 2017-06-12 ENCOUNTER — Encounter: Payer: Self-pay | Admitting: Pulmonary Disease

## 2017-06-12 ENCOUNTER — Ambulatory Visit (INDEPENDENT_AMBULATORY_CARE_PROVIDER_SITE_OTHER)
Admission: RE | Admit: 2017-06-12 | Discharge: 2017-06-12 | Disposition: A | Discharge status: Home / Self Care | Payer: Medicare HMO | Source: Ambulatory Visit | Attending: Acute Care | Admitting: Acute Care

## 2017-06-12 VITALS — BP 138/80 | HR 106 | Ht 62.0 in | Wt 112.0 lb

## 2017-06-12 DIAGNOSIS — F17210 Nicotine dependence, cigarettes, uncomplicated: Secondary | ICD-10-CM

## 2017-06-12 DIAGNOSIS — J449 Chronic obstructive pulmonary disease, unspecified: Secondary | ICD-10-CM

## 2017-06-12 DIAGNOSIS — Z122 Encounter for screening for malignant neoplasm of respiratory organs: Secondary | ICD-10-CM

## 2017-06-12 LAB — PULMONARY FUNCTION TEST
DL/VA % PRED: 72 %
DL/VA: 3.28 ml/min/mmHg/L
DLCO UNC: 13.27 ml/min/mmHg
DLCO unc % pred: 61 %
FEF 25-75 POST: 0.9 L/s
FEF 25-75 Pre: 1.1 L/sec
FEF2575-%Change-Post: -18 %
FEF2575-%Pred-Post: 39 %
FEF2575-%Pred-Pre: 48 %
FEV1-%CHANGE-POST: -1 %
FEV1-%PRED-PRE: 72 %
FEV1-%Pred-Post: 71 %
FEV1-POST: 1.7 L
FEV1-Pre: 1.73 L
FEV1FVC-%Change-Post: 8 %
FEV1FVC-%PRED-PRE: 92 %
FEV6-%Change-Post: -9 %
FEV6-%PRED-POST: 72 %
FEV6-%Pred-Pre: 80 %
FEV6-POST: 2.17 L
FEV6-Pre: 2.39 L
FEV6FVC-%CHANGE-POST: 0 %
FEV6FVC-%PRED-POST: 103 %
FEV6FVC-%Pred-Pre: 102 %
FVC-%Change-Post: -9 %
FVC-%Pred-Post: 70 %
FVC-%Pred-Pre: 78 %
FVC-Post: 2.17 L
FVC-Pre: 2.4 L
POST FEV6/FVC RATIO: 100 %
PRE FEV1/FVC RATIO: 72 %
PRE FEV6/FVC RATIO: 99 %
Post FEV1/FVC ratio: 78 %
RV % pred: 108 %
RV: 2.03 L
TLC % PRED: 94 %
TLC: 4.5 L

## 2017-06-12 NOTE — Progress Notes (Signed)
PFT done today. 

## 2017-06-12 NOTE — Progress Notes (Signed)
Shared Decision Making Visit Lung Cancer Screening Program [phone])   Eligibility:  Age 62 y.o.  Pack Years Smoking History Calculation 34 pack year smoking history (# packs/per year x # years smoked)  Recent History of coughing up blood  no  Unexplained weight loss? no ( >Than 15 pounds within the last 6 months )  Prior History Lung / other cancer no (Diagnosis within the last 5 years already requiring surveillance chest CT Scans).  Smoking Status Current Smoker  Former Smokers: Years since [employer] Date: NA  Visit Components:  Discussion included one or more decision making aids. yes  Discussion included risk/benefits of screening. yes  Discussion included potential follow up diagnostic testing for abnormal scans. yes  Discussion included meaning and risk of over diagnosis. yes  Discussion included meaning and risk of False Positives. yes  Discussion included meaning of total radiation exposure. yes  Counseling Included:  Importance of adherence to annual lung cancer LDCT screening. yes  Impact of comorbidities on ability to participate in the program. yes  Ability and willingness to under diagnostic treatment. yes  Smoking Cessation Counseling:  Current Smokers:   Discussed importance of smoking cessation. yes  Information about tobacco cessation classes and interventions provided to patient. yes  Patient provided with "ticket" for LDCT Scan. yes  Symptomatic Patient. no  Counseling  Diagnosis Code: Tobacco Use Z72.0  Asymptomatic Patient yes  Counseling (Intermediate counseling: > three minutes counseling) U0454  Former Smokers:   Discussed the importance of maintaining cigarette abstinence. yes  Diagnosis Code: Personal History of Nicotine Dependence. U98.119  Information about tobacco cessation classes and interventions provided to patient. Yes  Patient provided with "ticket" for LDCT Scan. yes  Written Order for Lung Cancer Screening  with LDCT placed in Epic. Yes (CT Chest Lung Cancer Screening Low Dose W/O CM) [id number] Z12.2-Screening of respiratory organs Z87.891-Personal history of nicotine dependence  I have spent 25 minutes of face to face time with Brittany [name] discussing the risks and benefits of lung cancer screening. We viewed a power point together that explained in detail the above noted topics. We paused at intervals to allow for questions to be asked and answered to ensure understanding.We discussed that the single most powerful action that she can take to decrease her risk of developing lung cancer is to quit smoking. We discussed whether or not she is ready to commit to setting a quit date. She is not ready to set a quit date but she is only smoking 4 cigarettes daily. We discussed options for tools to aid in quitting smoking including nicotine replacement therapy, non-nicotine medications, support groups, Quit Smart classes, and behavior modification. We discussed that often times setting smaller, more achievable goals, such as eliminating 1 cigarette a day for a week and then 2 cigarettes a day for a week can be helpful in slowly decreasing the number of cigarettes smoked. This allows for a sense of accomplishment as well as providing a clinical benefit. I gave her the " Be Stronger Than Your Excuses" card with contact information for community resources, classes, free nicotine replacement therapy, and access to mobile apps, text messaging, and on-line smoking cessation help. I have also given her my card and contact information in the event she needs to contact me. We discussed the time and location of the scan, and that either Brittany Miyamoto RN or I will call with the results within 24-48 hours of receiving them. I have offered her  a copy of  the power point we viewed  as a resource in the event they need reinforcement of the concepts we discussed today in the office. The patient verbalized understanding of all of  the above  and had no further questions upon leaving the office. They have my contact information in the event they have any further questions.  I spent 3 minutes counseling on smoking cessation and the health risks of continued tobacco abuse.  I explained to the patient that there has been a high incidence of coronary artery disease noted on these exams. I explained that this is a non-gated exam therefore degree or severity cannot be determined. This patient is not on statin therapy. I have asked the patient to follow-up with their PCP regarding any incidental finding of coronary artery disease and management with diet or medication as their PCP  feels is clinically indicated. The patient verbalized understanding of the above and had no further questions upon completion of the visit.      [name], NP 06/12/2017 3:33 PM

## 2017-06-12 NOTE — Progress Notes (Signed)
Brittany [name]    [id number]    10/05/1955  Primary Care Physician:Carter, Maxine GlennMonica, DO  Referring Physician: Kirt Boysarter, Monica, DO [address] [place] [id number]  Chief complaint:  Follow-up for COPD  HPI: 62 year old with coronary artery disease, hypertension, stroke.  She has had 2 admissions in November and December 2018 for recurrent pneumonia, COPD exacerbation.  Discharged on Symbicort, albuterol, supplemental oxygen bur is using inhalers intermittently but she feels she does not need them on a regular basis.  She continues on supplemental oxygen only at night.  Continues to smoke, down to 2 cigarettes a day.  Pets: None Occupation: Retired Environmental health practitioneradministrative assistant Exposures: No known exposures Smoking history: 100 Pack-year smoking history.  Continues to smoke 2 cigarettes a day Travel History: Not significant  Interim History: States that breathing is improved.  Still has some productive cough with tan colored mucus and occasional wheezing. She is using her Symbicort on a more regular basis twice daily.  Outpatient Encounter Medications as of 06/12/2017  Medication Sig  . albuterol (PROVENTIL HFA;VENTOLIN HFA) 108 (90 Base) MCG/ACT inhaler Inhale 2 puffs into the lungs every 6 (six) hours as needed for wheezing or shortness of breath.  . B Complex-C (B-COMPLEX WITH VITAMIN C) tablet Take 1 tablet by mouth daily.  . budesonide-formoterol (SYMBICORT) 160-4.5 MCG/ACT inhaler Inhale 12 puffs into the lungs daily.   . feeding supplement, ENSURE ENLIVE, (ENSURE ENLIVE) LIQD Take 237 mLs by mouth 2 (two) times daily between meals.  . gabapentin (NEURONTIN) 100 MG capsule Take 1 cap po qhs x 7 days then increase to 2 caps po qhs x 7 days then increase to 3 caps po qhs for nerve pain  . losartan (COZAAR) 50 MG tablet Take 1 tablet (50 mg total) daily by mouth.  . nitroGLYCERIN (NITROSTAT) 0.4 MG SL tablet Place 1 tablet (0.4 mg total) under the tongue every 5 (five)  minutes x 3 doses as needed for chest pain (chest pain).  . pantoprazole (PROTONIX) 40 MG tablet Take 1 tablet (40 mg total) by mouth daily.  . potassium chloride (K-DUR) 10 MEQ tablet Take 1 tablet (10 mEq total) by mouth every Monday, Wednesday, and Friday.  . prochlorperazine (COMPAZINE) 5 MG tablet Take 1 tablet (5 mg total) by mouth every 6 (six) hours as needed for refractory nausea / vomiting.   No facility-administered encounter medications on file as of 06/12/2017.     Allergies as of 06/12/2017 - Review Complete 06/12/2017  Allergen Reaction Noted  . Amoxicillin Hives   . Sulfonamide derivatives Nausea And Vomiting   . Wellbutrin [bupropion] Other (See Comments) 04/17/2017    Past Medical History:  Diagnosis Date  . Alzheimer disease   . Anxiety   . Bilateral pneumonia 01/2017   per new patient packet   . COPD (chronic obstructive pulmonary disease) (HCC) 01/2017   per new patient packet   . Depression   . Fatty liver   . Gallbladder disease 2014   per new patient packet   . Hernia, abdominal   . Hypertension   . Liver damage 01/2017   per new patient packet, Dr.Kadakia  . Normal cardiac stress test 2007  . Ovarian cyst 1977   8 1/2 lb left ovarian cyst, Dr.Cox, per new patient packet   . Pneumonia   . Right ovarian cyst    1992, 1993, 1994, 1995, and 1996 Dr.Neil, per new patient packet   . Stroke [facility](HCC)  Past Surgical History:  Procedure Laterality Date  . ABDOMINAL HYSTERECTOMY Bilateral    Total  . CESAREAN SECTION    . CHOLECYSTECTOMY    . INGUINAL HERNIA REPAIR Right   . LEFT OOPHORECTOMY  1977   with cyst removal, age 50  . OVARIAN CYST REMOVAL Right 93, 94,95, 96  . UMBILICAL HERNIA REPAIR      Family History  Problem Relation Age of Onset  . Stroke Mother   . COPD Mother   . Dementia Mother   . Breast cancer Mother   . Diabetes Father   . Heart attack Father   . Diabetes Sister   . Stroke Sister        x2  . Hepatitis C Daughter   .  Diabetes Paternal Grandmother     Social History   Socioeconomic History  . Marital status: Divorced    Spouse name: Not on file  . Number of children: 1  . Years of education: Not on file  . Highest education level: Not on file  Occupational History  . Occupation: disabled  Social Needs  . Financial resource strain: Not on file  . Food insecurity:    Worry: Not on file    Inability: Not on file  . Transportation needs:    Medical: Not on file    Non-medical: Not on file  Tobacco Use  . Smoking status: Current Every Day Smoker    Packs/day: 0.25    Years: 40.00    Pack years: 10.00    Types: Cigarettes  . Smokeless tobacco: Never Used  . Tobacco comment: Takes 2-3 puffs when smoking, 4 cigarettes a day   Substance and Sexual Activity  . Alcohol use: No    Frequency: Never  . Drug use: No  . Sexual activity: Never    Comment: disabled, `1 yr college, married.   Lifestyle  . Physical activity:    Days per week: Not on file    Minutes per session: Not on file  . Stress: Not on file  Relationships  . Social connections:    Talks on phone: Not on file    Gets together: Not on file    Attends religious service: Not on file    Active member of club or organization: Not on file    Attends meetings of clubs or organizations: Not on file    Relationship status: Not on file  . Intimate partner violence:    Fear of current or ex partner: Not on file    Emotionally abused: Not on file    Physically abused: Not on file    Forced sexual activity: Not on file  Other Topics Concern  . 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 No   Do you have difficulty preparing food or eating? No  Do you have difficulty managing your medications? No   Do you have difficulty managing your finances? No   Do you have difficulty affording your medications? Yes Inhalers     Review of systems: Review of Systems  Constitutional: Negative for fever and chills.  HENT: Negative.   Eyes: Negative for blurred vision.  Respiratory: as per HPI  Cardiovascular: Negative for chest pain and palpitations.  Gastrointestinal: Negative for vomiting, diarrhea, blood per rectum. Genitourinary: Negative for dysuria, urgency, frequency and hematuria.  Musculoskeletal: Negative for myalgias, back pain and joint pain.  Skin: Negative for itching and rash.  Neurological: Negative for dizziness, tremors, focal weakness, seizures and loss of consciousness.  Endo/Heme/Allergies: Negative for environmental allergies.  Psychiatric/Behavioral: Negative for depression, suicidal ideas and hallucinations.  All other systems reviewed and are negative.  Physical Exam: Blood pressure 112/62, pulse (!) 103, height 5\' 2"  (1.575 m), weight 118 lb 3.2 oz (53.6 kg), SpO2 96 %. Gen:      No acute distress HEENT:  EOMI, sclera anicteric Neck:     No masses; no thyromegaly Lungs:    Clear to auscultation bilaterally; normal respiratory effort CV:         Regular rate and rhythm; no murmurs Abd:      + bowel sounds; soft, non-tender; no palpable masses, no distension Ext:    No edema; adequate peripheral perfusion Skin:      Warm and dry; no rash Neuro: alert and oriented x 3 Psych: normal mood and affect  Data Reviewed: Chest x-ray 01/27/17-inflation, bilateral pulmonary opacities Chest x-ray 01/28/17- right mid lung and biapical opacities Chest x-ray 02/16/17- improvement in right midlung and apical opacities.  Coarse interstitial marking I  have reviewed the images personally.  PFTs 06/12/17 FVC 2.17 [70%], FEV1 1.70 [71%], F/F 78, TLC 94%, DLCO 61% Moderate obstruction, diffusion defect  CBC 02/16/17-WBC 5.9, eos 3%, absolute eosinophil count 177  Assessment:  COPD PFTs reviewed which show no overt obstruction by F/F criteria however there is a curvature to flow volume loop suggestive of moderate obstructive airway disease She has improved with regard to dyspnea, wheezing after using the Symbicort on a regular basis.  I have asked her to continue the same Ambulated with no desats in office today.  Active smoker I have encouraged her to quit completely. Time spent counseling  Scheduled for low-dose screening CT of the chest.  Plan/Recommendations: - Continue inhalers - Low dose screening CT scheduled for today - Smoking cessation  Chilton Greathouse MD [facility] Pager [phone] 06/12/2017, 2:12 PM  CC: Kirt Boys, DO

## 2017-06-12 NOTE — Patient Instructions (Signed)
Your PFTs show mild-moderate COPD Continue the Symbicort.  Use it 2 times twice daily for best effect Continue the albuterol as a rescue inhaler We will check your oxygen levels on exertion today Follow-up in 3 months.

## 2017-06-13 ENCOUNTER — Encounter: Payer: Self-pay | Admitting: Internal Medicine

## 2017-06-13 ENCOUNTER — Ambulatory Visit (INDEPENDENT_AMBULATORY_CARE_PROVIDER_SITE_OTHER): Payer: Medicare HMO | Admitting: Internal Medicine

## 2017-06-13 VITALS — BP 130/82 | HR 105 | Temp 98.3°F | Ht 62.0 in | Wt 113.0 lb

## 2017-06-13 DIAGNOSIS — F411 Generalized anxiety disorder: Secondary | ICD-10-CM | POA: Diagnosis not present

## 2017-06-13 DIAGNOSIS — G609 Hereditary and idiopathic neuropathy, unspecified: Secondary | ICD-10-CM

## 2017-06-13 DIAGNOSIS — G8929 Other chronic pain: Secondary | ICD-10-CM

## 2017-06-13 DIAGNOSIS — Z720 Tobacco use: Secondary | ICD-10-CM | POA: Diagnosis not present

## 2017-06-13 DIAGNOSIS — B1920 Unspecified viral hepatitis C without hepatic coma: Secondary | ICD-10-CM | POA: Diagnosis not present

## 2017-06-13 DIAGNOSIS — M545 Low back pain, unspecified: Secondary | ICD-10-CM

## 2017-06-13 DIAGNOSIS — F322 Major depressive disorder, single episode, severe without psychotic features: Secondary | ICD-10-CM | POA: Diagnosis not present

## 2017-06-13 MED ORDER — GABAPENTIN 300 MG PO CAPS: 300.0000 mg | ORAL_CAPSULE | Freq: Two times a day (BID) | ORAL | 6 refills | Status: DC

## 2017-06-13 MED ORDER — CITALOPRAM HYDROBROMIDE 10 MG PO TABS: 10.0000 mg | ORAL_TABLET | Freq: Every day | ORAL | 6 refills | Status: DC

## 2017-06-13 NOTE — Progress Notes (Signed)
Patient ID: Brittany [name], female   DOB: 04/01/1955, 62 y.o.   MRN: [id number]   [facility]  Provider: DR Elmon KirschnerMONICA S Edge Mauger  Code Status:  Goals of Care:  Advanced Directives 04/05/2017  Does Patient Have a Medical Advance Directive? No  Would patient like information on creating a medical advance directive? Yes (MAU/Ambulatory/Procedural Areas - Information given)     Chief Complaint  Patient presents with  . Medical Management of Chronic Issues    1 month follow-up, newly dx with Hep C, patient c/o left lower back pain.   . Medication Management    Discuss alternative to wellbutrin, wellbutrin caused nausea and diarrhea even while taking nausea pill.  . Medication Refill    No refills needed   . Health Maintenance    Discuss if patient needs future pap smear- had complete hysterectomy 1996, if not need to remove from HM recommendatin   . Orders    BMD order pending     HPI: Patient is a 62 y.o. female seen today for medical management of chronic diseases.  Her daughter moved back and she now reports increased stressors and depression. She stopped wellbutrin 2/2 uncontrolled N/V. She continues to smoke 4 cigs/day. She would like a different antidepressant. She saw pulmonary and was dx with COPD. She also saw GI and acute hepatitis panel (+) Hep C with Hep C titer 11, 900, 000/log 7.08.   MDD -  She stopped wellbutrin 2/2 N/V. She chose to stop med vs stop smoking.   Peripheral neuropathy - improved on gabapentin 300mg  qhs but still uncontrolled  She c/o 10 day hx of b/l burning sensation of feet. Pain improves when feet placed on cool floor. No hx DM. Her sister and paternal GM with hx DM. She has chronic LBP. No loss of bowel/bladder control.   She continues to live in a motel. Her daughter moved back in after release from jail. Daughter continues to use heroin  Past Medical History:  Diagnosis Date  . Alzheimer disease   . Anxiety   . Bilateral pneumonia 01/2017     per new patient packet   . COPD (chronic obstructive pulmonary disease) (HCC) 01/2017   per new patient packet   . Depression   . Fatty liver   . Gallbladder disease 2014   per new patient packet   . Hernia, abdominal   . Hypertension   . Liver damage 01/2017   per new patient packet, Dr.Kadakia  . Normal cardiac stress test 2007  . Ovarian cyst 1977   8 1/2 lb left ovarian cyst, Dr.Cox, per new patient packet   . Pneumonia   . Right ovarian cyst    1992, 1993, 1994, 1995, and 1996 Dr.Neil, per new patient packet   . Stroke [facility](HCC)     Past Surgical History:  Procedure Laterality Date  . ABDOMINAL HYSTERECTOMY Bilateral    Total  . CESAREAN SECTION    . CHOLECYSTECTOMY    . INGUINAL HERNIA REPAIR Right   . LEFT OOPHORECTOMY  1977   with cyst removal, age 62  . OVARIAN CYST REMOVAL Right 93, 94,95, 96  . UMBILICAL HERNIA REPAIR       reports that she has been smoking cigarettes.  She has a 34.50 pack-year smoking history. She has never used smokeless tobacco. She reports that she does not drink alcohol or use drugs. Social History   Socioeconomic History  . Marital status: Divorced    Spouse name: Not  on file  . Number of children: 1  . Years of education: Not on file  . Highest education level: Not on file  Occupational History  . Occupation: disabled  Social Needs  . Financial resource strain: Not on file  . Food insecurity:    Worry: Not on file    Inability: Not on file  . Transportation needs:    Medical: Not on file    Non-medical: Not on file  Tobacco Use  . Smoking status: Current Every Day Smoker    Packs/day: 0.75    Years: 46.00    Pack years: 34.50    Types: Cigarettes  . Smokeless tobacco: Never Used  . Tobacco comment: Takes 2-3 puffs when smoking, 4 cigarettes a day   Substance and Sexual Activity  . Alcohol use: No    Frequency: Never  . Drug use: No  . Sexual activity: Never    Comment: disabled, `1 yr college, married.   Lifestyle   . Physical activity:    Days per week: Not on file    Minutes per session: Not on file  . Stress: Not on file  Relationships  . Social connections:    Talks on phone: Not on file    Gets together: Not on file    Attends religious service: Not on file    Active member of club or organization: Not on file    Attends meetings of clubs or organizations: Not on file    Relationship status: Not on file  . Intimate partner violence:    Fear of current or ex partner: Not on file    Emotionally abused: Not on file    Physically abused: Not on file    Forced sexual activity: Not on file  Other Topics Concern  . 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 No   Do you have difficulty preparing food or eating? No   Do you have difficulty managing your medications? No   Do you have difficulty managing your finances? No   Do you have difficulty affording your medications? Yes Inhalers     Family History  Problem Relation Age of Onset  . Stroke Mother   . COPD Mother   . Dementia Mother   . Breast cancer Mother   . Diabetes Father   . Heart attack Father   . Diabetes Sister   . Stroke Sister        x2  . Hepatitis C Daughter   . Diabetes Paternal Grandmother     Allergies  Allergen Reactions  . Amoxicillin Hives     Has patient had a PCN reaction causing immediate rash, facial/tongue/throat swelling, SOB or lightheadedness with hypotension: Yes hives Has patient had a PCN reaction causing severe rash involving mucus  membranes or skin necrosis: No Has patient  had a PCN reaction that required hospitalization No Has patient had a PCN reaction occurring within the last 10 years: Yes If all of the above answers are "NO", then may proceed with Cephalosporin use.   . Sulfonamide Derivatives Nausea And Vomiting  . Wellbutrin [Bupropion] Other (See Comments)    Lightheaded Diarrhea    Outpatient Encounter Medications as of 06/13/2017  Medication Sig  . albuterol (PROVENTIL HFA;VENTOLIN HFA) 108 (90 Base) MCG/ACT inhaler Inhale 2 puffs into the lungs every 6 (six) hours as needed for wheezing or shortness of breath.  . B Complex-C (B-COMPLEX WITH VITAMIN C) tablet Take 1 tablet by mouth daily.  . budesonide-formoterol (SYMBICORT) 160-4.5 MCG/ACT inhaler Inhale 12 puffs into the lungs daily.   . feeding supplement, ENSURE ENLIVE, (ENSURE ENLIVE) LIQD Take 237 mLs by mouth 2 (two) times daily between meals.  . gabapentin (NEURONTIN) 100 MG capsule Take 1 cap po qhs x 7 days then increase to 2 caps po qhs x 7 days then increase to 3 caps po qhs for nerve pain  . losartan (COZAAR) 50 MG tablet Take 1 tablet (50 mg total) daily by mouth.  . nitroGLYCERIN (NITROSTAT) 0.4 MG SL tablet Place 1 tablet (0.4 mg total) under the tongue every 5 (five) minutes x 3 doses as needed for chest pain (chest pain).  . pantoprazole (PROTONIX) 40 MG tablet Take 1 tablet (40 mg total) by mouth daily.  . potassium chloride (K-DUR) 10 MEQ tablet Take 1 tablet (10 mEq total) by mouth every Monday, Wednesday, and Friday.  . prochlorperazine (COMPAZINE) 5 MG tablet Take 1 tablet (5 mg total) by mouth every 6 (six) hours as needed for refractory nausea / vomiting.   No facility-administered encounter medications on file as of 06/13/2017.     Review of Systems:  Review of Systems  Musculoskeletal: Positive for arthralgias.  Psychiatric/Behavioral: Positive for dysphoric mood and sleep disturbance. The patient is nervous/anxious.   All  other systems reviewed and are negative.   Health Maintenance  Topic Date Due  . PAP SMEAR  02/18/1979  . MAMMOGRAM  06/14/2018 (Originally 06/08/2015)  . COLONOSCOPY  06/14/2018 (Originally 02/18/2008)  . TETANUS/TDAP  03/21/2024  . INFLUENZA VACCINE  Completed  . Hepatitis C Screening  Completed  . HIV Screening  Completed    Physical Exam: Vitals:   06/13/17 1341  BP: 130/82  Pulse: (!) 105  Temp: 98.3 F (36.8 C)  TempSrc: Oral  SpO2: 98%  Weight: 113 lb (51.3 kg)  Height: 5\' 2"  (1.575 m)   Body mass index is 20.67 kg/m. Physical Exam  Constitutional: She is oriented to person, place, and time. She appears well-developed.  Frail appearing in NAD  Neurological: She is alert and oriented to person, place, and time.  Skin: Skin is warm and dry. No rash noted.  Psychiatric: Her behavior is normal. Thought content normal. Her mood appears anxious. She exhibits a depressed mood. She exhibits abnormal recent memory.    Labs reviewed: Basic Metabolic Panel: Recent Labs    01/29/17 0515 01/30/17 0450  02/01/17 0437  02/17/17 0313 02/18/17 0845 03/08/17 1231  NA 143 142   < > 138   < > 136 136 137  K 2.7* 5.3*   < > 4.6   < > 2.7* 4.2 4.3  CL 106 110   < > 105   < > 104 104 101  CO2 22 22   < > 25   < > 24 23 27   GLUCOSE  183* 191*   < > 181*   < > 125* 136* 83  BUN 24* 45*   < > 49*   < > 8 11 15   CREATININE 0.75 0.78   < > 0.73   < > 0.66 0.67 0.60  CALCIUM 9.1 9.3   < > 8.7*   < > 8.2* 9.0 9.4  MG 1.9 2.3  --  2.4  --   --   --   --    < > = values in this interval not displayed.   Liver Function Tests: Recent Labs    01/28/17 0514 02/16/17 1829 03/08/17 1231 04/11/17 1534 05/26/17 1406  AST 15 392* 221* 78* 104*  ALT 14 1,330* 335* 126* 130*  ALKPHOS 56 162*  --   --  59  BILITOT 0.5 4.8* 1.0  1.0 0.8 0.6  PROT 6.2* 5.4* 6.4 7.2 7.3  ALBUMIN 3.1* 2.8*  --   --  4.3   No results for input(s): LIPASE, AMYLASE in the last 8760 hours. No results for  input(s): AMMONIA in the last 8760 hours. CBC: Recent Labs    01/31/17 0418 02/01/17 0437 02/16/17 1829  WBC 17.2* 23.8* 5.9  NEUTROABS  --   --  3.5  HGB 15.6* 14.6 12.0  HCT 45.4 43.2 35.0*  MCV 88.8 89.4 86.8  PLT 322 271 183   Lipid Panel: No results for input(s): CHOL, HDL, LDLCALC, TRIG, CHOLHDL, LDLDIRECT in the last 8760 hours. Lab Results  Component Value Date   HGBA1C 5.6 05/16/2017    Procedures since last visit: Ct Chest Lung Ca Screen Low Dose W/o Cm  Result Date: 06/13/2017 CLINICAL DATA:  Lung cancer screening. Thirty-four pack-year history. Current asymptomatic smoker. EXAM: CT CHEST WITHOUT CONTRAST LOW-DOSE FOR LUNG CANCER SCREENING TECHNIQUE: Multidetector CT imaging of the chest was performed following the standard protocol without IV contrast. COMPARISON:  None FINDINGS: Cardiovascular: Normal heart size. No pericardial effusion. Aortic atherosclerosis. Calcification in the RCA, LAD and left circumflex coronary arteries. Mediastinum/Nodes: No enlarged mediastinal, hilar, or axillary lymph nodes. Thyroid gland, trachea, and esophagus demonstrate no significant findings. Lungs/Pleura: Mild changes of centrilobular emphysema. No pleural effusion. Multiple small pulmonary nodules are scattered throughout both lungs. The largest nodule is in the left lung apex with an equivalent diameter of 2.4 mm. Upper Abdomen: No acute abnormality. Musculoskeletal: No chest wall mass or suspicious bone lesions identified. IMPRESSION: 1. Lung-RADS 2, benign appearance or behavior. Continue annual screening with low-dose chest CT without contrast in 12 months. 2. Aortic Atherosclerosis (ICD10-I70.0) and Emphysema (ICD10-J43.9). 3. RCA, LAD and left circumflex coronary artery calcifications. Electronically Signed   By: Signa Kell M.D.   On: 06/13/2017 09:24    Assessment/Plan   ICD-10-CM   1. Idiopathic peripheral neuropathy G60.9 gabapentin (NEURONTIN) 300 MG capsule  2. Current  severe episode of major depressive disorder without psychotic features, unspecified whether recurrent (HCC) F32.2 citalopram (CELEXA) 10 MG tablet  3. GAD (generalized anxiety disorder) F41.1 citalopram (CELEXA) 10 MG tablet  4. Chronic low back pain without sciatica, unspecified back pain laterality M54.5    G89.29   5. Hepatitis C virus infection without hepatic coma, unspecified chronicity B19.20   6. Tobacco abuse Z72.0    INCREASE GABAPENTIN to 1 capsule 2 times daily for nerve pain  START CITALOPRAM 10MG  DAILY FOR DEPRESSION  PLEASE GO GET LOW BACK XRAY AT [facility] (NO APPOINTMENT NECESSARY)  Smoking cessation discussed and highly urged  Continue other medications as ordered  May use Salon pas sparingly for back pain  Follow up in 1 month depression and GAD, Hep C, neuropathy    Keyler Hoge S. Ancil Linsey  [facility] [address] [place], [place] [address] [phone] Cell (Monday-Friday 8 AM - 5 PM) [phone] After 5 PM and follow prompts

## 2017-06-13 NOTE — Patient Instructions (Addendum)
INCREASE GABAPENTIN to 1 capsule 2 times daily for nerve pain  START CITALOPRAM 10MG  DAILY FOR DEPRESSION  PLEASE GO GET LOW BACK XRAY AT [facility] (NO APPOINTMENT NECESSARY)  Smoking cessation discussed and highly urged  Continue other medications as ordered  May use Salon pas sparingly for back pain  Follow up in 1 month depression and GAD, Hep C, neuropathy

## 2017-06-14 ENCOUNTER — Encounter: Payer: Self-pay | Admitting: Pulmonary Disease

## 2017-06-20 ENCOUNTER — Ambulatory Visit (INDEPENDENT_AMBULATORY_CARE_PROVIDER_SITE_OTHER): Payer: Medicare HMO | Admitting: Infectious Diseases

## 2017-06-20 ENCOUNTER — Encounter: Payer: Self-pay | Admitting: Infectious Diseases

## 2017-06-20 VITALS — BP 111/71 | HR 89 | Temp 98.0°F | Ht 62.0 in | Wt 119.8 lb

## 2017-06-20 DIAGNOSIS — R945 Abnormal results of liver function studies: Secondary | ICD-10-CM

## 2017-06-20 DIAGNOSIS — B182 Chronic viral hepatitis C: Secondary | ICD-10-CM | POA: Diagnosis not present

## 2017-06-20 DIAGNOSIS — R7989 Other specified abnormal findings of blood chemistry: Secondary | ICD-10-CM

## 2017-06-20 NOTE — Progress Notes (Signed)
[facility]   Name: Brittany [name] DOB: [birth year]  MRN: [id number]  PCP: Kirt Boysarter, Monica, DO  Referring Provider: Dr. Russella DarStark  GI Provider: Dr. Russella DarStark ([facility])   CC: consideration for treatment for chronic hepatitis C  HPI: Brittany [name] is a 62 y.o. female who presents for initial evaluation and management of chronic hepatitis C.  Patient tested positive during outpatient work up for elevated LFTs in January of this year with positive antibody and VL of 11,900,000 copies. Hepatitis C-associated risk factors present are: sexual contact with her husband of 25 years whom she recalls was Hep C positive and possible blood transfusions required during hysterectomy d/t significant bleeding more than 20 years ago. Patient denies accidental needle stick, intranasal drug use, IV drug abuse, multiple sexual partners, renal dialysis, tattoos. During her work up of her elevated LFTs she has had an abdominal ultrasound showing slightly echogenic liver parenchyma that may indicate some fatty infiltration but no focal abnormality or mass. She is working with Dr. Russella DarStark and was also found to have a slightly elevated AFP and no evidence of cirrhosis >> plan on repeating AFP I a few months. During this time it was discovered her HCV Ab was positive. Patient has not had prior treatment for Hepatitis C. Patient does not have a past history of liver disease. Patient does not have a family history of liver disease. Patient does not  have associated signs or symptoms related to liver disease aside from nausea which may be medication related per our discussion.    Review of Systems  Constitutional: Negative for chills and fever.  HENT: Negative for tinnitus.   Eyes: Negative for blurred vision and photophobia.  Respiratory: Positive for cough. Negative for sputum production.   Cardiovascular: Negative for chest pain.  Gastrointestinal: Positive for abdominal pain (mild epigastric ) and  nausea. Negative for diarrhea and vomiting.  Genitourinary: Negative for dysuria.  Skin: Negative for rash.  Neurological: Negative for headaches.    Past Medical History:  Diagnosis Date  . Alzheimer disease   . Anxiety   . Bilateral pneumonia 01/2017   per new patient packet   . COPD (chronic obstructive pulmonary disease) (HCC) 01/2017   per new patient packet   . Depression   . Fatty liver   . Gallbladder disease 2014   per new patient packet   . Hernia, abdominal   . Hypertension   . Liver damage 01/2017   per new patient packet, Dr.Kadakia  . Normal cardiac stress test 2007  . Ovarian cyst 1977   8 1/2 lb left ovarian cyst, Dr.Cox, per new patient packet   . Pneumonia   . Right ovarian cyst    1992, 1993, 1994, 1995, and 1996 Dr.Neil, per new patient packet   . Stroke [facility](HCC)    2 strokes.    Outpatient Medications Prior to Visit  Medication Sig Dispense Refill  . albuterol (PROVENTIL HFA;VENTOLIN HFA) 108 (90 Base) MCG/ACT inhaler Inhale 2 puffs into the lungs every 6 (six) hours as needed for wheezing or shortness of breath.    . B Complex-C (B-COMPLEX WITH VITAMIN C) tablet Take 1 tablet by mouth daily.    . budesonide-formoterol (SYMBICORT) 160-4.5 MCG/ACT inhaler Inhale 12 puffs into the lungs daily.     Marland Kitchen. buPROPion (WELLBUTRIN SR) 150 MG 12 hr tablet     . feeding supplement, ENSURE ENLIVE, (ENSURE ENLIVE) LIQD Take 237 mLs by mouth 2 (two) times daily between meals. 60  Bottle 6  . gabapentin (NEURONTIN) 300 MG capsule Take 1 capsule (300 mg total) by mouth 2 (two) times daily. For nerve pain 60 capsule 6  . losartan (COZAAR) 50 MG tablet Take 1 tablet (50 mg total) daily by mouth. 30 tablet 1  . nitroGLYCERIN (NITROSTAT) 0.4 MG SL tablet Place 1 tablet (0.4 mg total) under the tongue every 5 (five) minutes x 3 doses as needed for chest pain (chest pain). 25 tablet 1  . pantoprazole (PROTONIX) 40 MG tablet Take 1 tablet (40 mg total) by mouth daily. 30 tablet 3  .  potassium chloride (K-DUR) 10 MEQ tablet Take 1 tablet (10 mEq total) by mouth every Monday, Wednesday, and Friday. 30 tablet 3  . prochlorperazine (COMPAZINE) 5 MG tablet Take 1 tablet (5 mg total) by mouth every 6 (six) hours as needed for refractory nausea / vomiting. 56 tablet 0  . citalopram (CELEXA) 10 MG tablet Take 1 tablet (10 mg total) by mouth daily. For mood (Patient not taking: Reported on 06/20/2017) 30 tablet 6   No facility-administered medications prior to visit.     Allergies  Allergen Reactions  . Amoxicillin Hives     Has patient had a PCN reaction causing immediate rash, facial/tongue/throat swelling, SOB or lightheadedness with hypotension: Yes hives Has patient had a PCN reaction causing severe rash involving mucus membranes or skin necrosis: No Has patient had a PCN reaction that required hospitalization No Has patient had a PCN reaction occurring within the last 10 years: Yes If all of the above answers are "NO", then may proceed with Cephalosporin use.   . Sulfonamide Derivatives Nausea And Vomiting  . Wellbutrin [Bupropion] Other (See Comments)    Lightheaded Diarrhea    Social History   Tobacco Use  . Smoking status: Current Every Day Smoker    Packs/day: 0.75    Years: 46.00    Pack years: 34.50    Types: Cigarettes  . Smokeless tobacco: Never Used  . Tobacco comment: Takes 2-3 puffs when smoking, 4 cigarettes a day   Substance Use Topics  . Alcohol use: No    Frequency: Never  . Drug use: No    Family History  Problem Relation Age of Onset  . Stroke Mother   . COPD Mother   . Dementia Mother   . Breast cancer Mother   . Diabetes Father   . Heart attack Father   . Diabetes Sister   . Stroke Sister        x2  . Hepatitis C Daughter   . Diabetes Paternal Grandmother     Objective:  Constitutional: in no apparent distress, well developed and well nourished, alert and normal vitals,  Vitals:   06/20/17 0849  BP: 111/71  Pulse: 89    Temp: 98 F (36.7 C)   Eyes: anicteric Cardiovascular: Cor RRR and No murmurs Respiratory: clear Gastrointestinal: Bowel sounds are normal, liver is not enlarged, spleen is not enlarged Musculoskeletal: peripheral pulses normal, no pedal edema, no clubbing or cyanosis Skin: negative for - jaundice, spider hemangioma, telangiectasia, palmar erythema, ecchymosis and atrophy; no porphyria cutanea tarda Lymphatic: no cervical lymphadenopathy   Laboratory Genotype: No results found for: HCVGENOTYPE   HCV viral load: 11,900,000 copies 05/26/17  Hep A and B non-immune based on 05/26/17 labs   Lab Results  Component Value Date   WBC 5.9 02/16/2017   HGB 12.0 02/16/2017   HCT 35.0 (L) 02/16/2017   MCV 86.8 02/16/2017   PLT 183  02/16/2017    Lab Results  Component Value Date   CREATININE 0.60 03/08/2017   BUN 15 03/08/2017   NA 137 03/08/2017   K 4.3 03/08/2017   CL 101 03/08/2017   CO2 27 03/08/2017    Lab Results  Component Value Date   ALT 130 (H) 05/26/2017   AST 104 (H) 05/26/2017   ALKPHOS 59 05/26/2017     Labs and history reviewed and show CHILD-PUGH A  5-6 points: Child class A 7-9 points: Child class B 10-15 points: Child class C  Lab Results  Component Value Date   INR 1.03 11/28/2016   BILITOT 0.6 05/26/2017   ALBUMIN 4.3 05/26/2017   Assessment & Plan:   Problem List Items Addressed This Visit      Digestive   Chronic viral hepatitis C (HCC) - Primary    New Patient with Chronic Hepatitis C genotype unknown, treatment naive. During her acute elevation of LFTs she did not have any symptoms of hepatitis and was actually having trouble with pulmonary infections during this time. She has not been sexually involved with her ex husband in 8 years and has no risk factors that would lead me to believe she would have experienced acute infection.   I discussed with the patient the lab findings that confirm chronic hepatitis C as well as the natural history and  progression of disease including about 30% of people who develop cirrhosis of the liver if left untreated and once cirrhosis is established there is a 2-7% risk per year of liver cancer and liver failure.  I discussed the importance of treatment and benefits in reducing the risk, even if significant liver fibrosis exists. I also discussed risk for re-infection following treatment should he not continue to modify risk factors.    Patient counseled extensively on limiting acetaminophen to no more than 2 grams daily, avoidance of alcohol.  Transmission discussed with patient including sexual transmission, sharing razors and toothbrush.   Will need referral to gastroenterology if concern for cirrhosis --> already working with Dr. Russella Dar - appreciate his assistance   No need for referral to drug/alcohol rehab services.   Will prescribe appropriate medication based on genotype and coverage   Hepatitis A and B titers to be drawn today with appropriate vaccinations as needed --> will need vaccines for hep a and b with upcoming visits   Pneumovax vaccine at upcoming visit if not previously given --> we discussed that she will receive this at her next visit.   Further work up to include liver staging via fibrosis panel. --> Abdominal ultrasound without evidence of cirrhosis or lesion.   She will return for an appointment in one week to review results and discuss plan for treatment once further lab results are obtained.        Relevant Orders   Hepatitis C genotype   Hepatitis B surface antigen   Hepatitis B surface antibody   Hepatitis A antibody, total   INR/PT   Liver Fibrosis, FibroTest-ActiTest   CBC     Other   Elevated LFTs    Improving. No signs of cirrhosis or lesion on liver. Will have her continue to follow up with GI team - appreciate their care.         Rexene Alberts, MSN, NP-C [facility] Office: [phone] Pager:  [phone]  06/20/17 3:42 PM

## 2017-06-20 NOTE — Congregational Nurse Program (Signed)
Congregational Nurse Program Note  Date of Encounter: 05/31/2017  Past Medical History: Past Medical History:  Diagnosis Date  . Alzheimer disease   . Anxiety   . Bilateral pneumonia 01/2017   per new patient packet   . COPD (chronic obstructive pulmonary disease) (HCC) 01/2017   per new patient packet   . Depression   . Fatty liver   . Gallbladder disease 2014   per new patient packet   . Hernia, abdominal   . Hypertension   . Liver damage 01/2017   per new patient packet, Dr.Kadakia  . Normal cardiac stress test 2007  . Ovarian cyst 1977   8 1/2 lb left ovarian cyst, Dr.Cox, per new patient packet   . Pneumonia   . Right ovarian cyst    1992, 1993, 1994, 1995, and 1996 Dr.Neil, per new patient packet   . Stroke [facility](HCC)    2 strokes.     Encounter Details: CNP Questionnaire - 06/20/17 1636      Questionnaire   Patient Status  Not Applicable    Race  White or Caucasian    Location Patient Served At  [facility] patient    Insurance  Medicaid    Uninsured  Not Applicable    Food  No food insecurities    Housing/Utilities  No permanent housing    Transportation  Yes, need transportation assistance;Within past 12 months, lack of transportation negatively impacted life;Provided transportation assistance (bus pass, taxi voucher, etc.)    Interpersonal Safety  Yes, feel physically and emotionally safe where you currently live    Medication  No medication insecurities    Medical Provider  Yes    Referrals  Primary Care Provider/Clinic;Area Agency    ED Visit Averted  Not Applicable    Life-Saving Intervention Made  Not Applicable     [facility] team visit today client doing fairly well states she was seen by Gastroenterologist this week and more test done ,reports liver enzymes are coming down and was told no significant reason for elevation at this time ,she has a fatty liver and was counseled regarding that .Continues to have nausea  Off and on ,medication helps ,blood done at office  visit . Has had some diarrhea recently ,to see Dr Montez Moritacarter 3/23 will go over with her her symptoms and is scheduled to see pulmonologist also. Continues to search for housing options ,Sw working closely weekly to give client new leads and nurse has driven around for listing and shared her information with client. Foot pain somewhat better with taking Gabapentin. Will ask for [facility] extension and viist in one week .

## 2017-06-20 NOTE — Congregational Nurse Program (Signed)
Congregational Nurse Program Note  Date of Encounter: 06/10/2017  Past Medical History: Past Medical History:  Diagnosis Date  . Alzheimer disease   . Anxiety   . Bilateral pneumonia 01/2017   per new patient packet   . COPD (chronic obstructive pulmonary disease) (HCC) 01/2017   per new patient packet   . Depression   . Fatty liver   . Gallbladder disease 2014   per new patient packet   . Hernia, abdominal   . Hypertension   . Liver damage 01/2017   per new patient packet, Dr.Kadakia  . Normal cardiac stress test 2007  . Ovarian cyst 1977   8 1/2 lb left ovarian cyst, Dr.Cox, per new patient packet   . Pneumonia   . Right ovarian cyst    1992, 1993, 1994, 1995, and 1996 Dr.Neil, per new patient packet   . Stroke [facility](HCC)    2 strokes.     Encounter Details: CNP Questionnaire - 06/20/17 1649      Questionnaire   Patient Status  Not Applicable    Race  White or Caucasian    Location Patient Served At  [facility]    Insurance  Medicaid    Uninsured  Not Applicable    Food  No food insecurities    Housing/Utilities  No permanent housing    Transportation  Yes, need transportation assistance;Within past 12 months, lack of transportation negatively impacted life;Provided transportation assistance (bus pass, taxi voucher, etc.)    Interpersonal Safety  Yes, feel physically and emotionally safe where you currently live    Medication  No medication insecurities    Medical Provider  Yes    Referrals  Primary Care Provider/Clinic;Area Agency    ED Visit Averted  Not Applicable    Life-Saving Intervention Made  Not Applicable      Client available for [facility] team visit today. States immediately that she has been told now that she has Hep.C and wanted to know about the disease and how in the world she contacted it ,nurse counseled and looked it up for her on her phone so she would be able to read more about it . Team was supportive and attempted to answer her questions and give  her hope and relieve some of her anxieties. Nausea continues and informed her that her Hep. C may be causing that . Foot pain continues ,daughter had been in hospital for kidney stones ,is out now and counseled regarding her need to get a PCP an options given  Still needs to get a new set of 02  Tubing  and will get her sister to drop her by medical supply store. Housing options and leads reviewed with client as team and client are searching for affordable options. Sw talked with client about daughter getting a job to help out with finances in order to give them more options they can afford,she was in agreement . Client has an appointment at the Infections Disease clinic on 06-20-17 at 9 am .  Team will continue to look for housing options and return 06-17-17 for follow up /support and  Housing review.

## 2017-06-20 NOTE — Congregational Nurse Program (Signed)
Congregational Nurse Program Note  Date of Encounter: 06/17/2017  Past Medical History: Past Medical History:  Diagnosis Date  . Alzheimer disease   . Anxiety   . Bilateral pneumonia 01/2017   per new patient packet   . COPD (chronic obstructive pulmonary disease) (HCC) 01/2017   per new patient packet   . Depression   . Fatty liver   . Gallbladder disease 2014   per new patient packet   . Hernia, abdominal   . Hypertension   . Liver damage 01/2017   per new patient packet, Dr.Kadakia  . Normal cardiac stress test 2007  . Ovarian cyst 1977   8 1/2 lb left ovarian cyst, Dr.Cox, per new patient packet   . Pneumonia   . Right ovarian cyst    1992, 1993, 1994, 1995, and 1996 Dr.Neil, per new patient packet   . Stroke [facility](HCC)    2 strokes.     Encounter Details: CNP Questionnaire - 06/20/17 1704      Questionnaire   Patient Status  Not Applicable    Race  White or Caucasian    Location Patient Served At  [facility]    Insurance  Medicaid    Uninsured  Not Applicable    Food  No food insecurities    Housing/Utilities  No permanent housing    Transportation  Yes, need transportation assistance;Within past 12 months, lack of transportation negatively impacted life;Provided transportation assistance (bus pass, taxi voucher, etc.)    Interpersonal Safety  Yes, feel physically and emotionally safe where you currently live    Medication  No medication insecurities    Medical Provider  Yes    Referrals  Primary Care Provider/Clinic;Area Agency    ED Visit Averted  Not Applicable    Life-Saving Intervention Made  Not Applicable     [facility] team follow up visit today finds client  worrying about Hep. C diagnosis but a little more accepting as she stated her PCP .she is still taking her nausea  medication ,states her MD has told her she has  a damaged liver "she  wants her to understand her diagnosis but still has a lot of questions ,counseled about ways to get Hep. C and what could  of lead to it. Nurse gave client information on the disease in hopes it would answer her questions and prepare her for the her visit on 06-20-17 she was thankful.   States her visit for her  Pulmonary Function test and visit with  the pulmonologist was long and exhausting States she has mild  to moderate  COPD .   Dr  Montez Moritaarter her PCP   Increased her Gabapentin  to 300 mg  2 times per day for burning of the feet. Her weight was good 113 lbs , blood pressure good on that  visit 130/82. Still taking compazine for nausea ,to have bone density test done.  Housing leads and options reviewed . Next visit 06-22-17

## 2017-06-20 NOTE — Patient Instructions (Addendum)
Nice to meet you today!   We need to get a little more information about your hepatitis c infection before we start your treatment. I anticipate that we can get you started in a few weeks.   Until we can get you treated would recommend: - condoms with sexual encounters or abstinence - no sharing of razors, toothbrushes or anything that could potentially have blood on it.  - limit alcohol to as little as possible to less than 1 drink a day  - limit tylenol use to less than 2,000 mg daily   We will see you back in 1 week for a quick visit to review lab work and get you treated.

## 2017-06-20 NOTE — Assessment & Plan Note (Addendum)
New Patient with Chronic Hepatitis C genotype unknown, treatment naive. During her acute elevation of LFTs she did not have any symptoms of hepatitis and was actually having trouble with pulmonary infections during this time. She has not been sexually involved with her ex husband in 8 years and has no risk factors that would lead me to believe she would have experienced acute infection.   I discussed with the patient the lab findings that confirm chronic hepatitis C as well as the natural history and progression of disease including about 30% of people who develop cirrhosis of the liver if left untreated and once cirrhosis is established there is a 2-7% risk per year of liver cancer and liver failure.  I discussed the importance of treatment and benefits in reducing the risk, even if significant liver fibrosis exists. I also discussed risk for re-infection following treatment should he not continue to modify risk factors.    Patient counseled extensively on limiting acetaminophen to no more than 2 grams daily, avoidance of alcohol.  Transmission discussed with patient including sexual transmission, sharing razors and toothbrush.   Will need referral to gastroenterology if concern for cirrhosis --> already working with Dr. Russella DarStark - appreciate his assistance   No need for referral to drug/alcohol rehab services.   Will prescribe appropriate medication based on genotype and coverage   Hepatitis A and B titers to be drawn today with appropriate vaccinations as needed --> will need vaccines for hep a and b with upcoming visits   Pneumovax vaccine at upcoming visit if not previously given --> we discussed that she will receive this at her next visit.   Further work up to include liver staging via fibrosis panel. --> Abdominal ultrasound without evidence of cirrhosis or lesion.   She will return for an appointment in one week to review results and discuss plan for treatment once further lab results are  obtained.

## 2017-06-20 NOTE — Assessment & Plan Note (Signed)
Improving. No signs of cirrhosis or lesion on liver. Will have her continue to follow up with GI team - appreciate their care.

## 2017-06-21 ENCOUNTER — Other Ambulatory Visit: Payer: Self-pay | Admitting: Acute Care

## 2017-06-21 DIAGNOSIS — F17210 Nicotine dependence, cigarettes, uncomplicated: Secondary | ICD-10-CM

## 2017-06-21 DIAGNOSIS — Z122 Encounter for screening for malignant neoplasm of respiratory organs: Secondary | ICD-10-CM

## 2017-06-21 LAB — CBC
HCT: 39.2 % (ref 35.0–45.0)
Hemoglobin: 13.6 g/dL (ref 11.7–15.5)
MCH: 29.9 pg (ref 27.0–33.0)
MCHC: 34.7 g/dL (ref 32.0–36.0)
MCV: 86.2 fL (ref 80.0–100.0)
MPV: 12.6 fL — AB (ref 7.5–12.5)
PLATELETS: 215 10*3/uL (ref 140–400)
RBC: 4.55 10*6/uL (ref 3.80–5.10)
RDW: 13.2 % (ref 11.0–15.0)
WBC: 5.7 10*3/uL (ref 3.8–10.8)

## 2017-06-21 LAB — HEPATITIS A ANTIBODY, TOTAL: HEPATITIS A AB,TOTAL: NONREACTIVE

## 2017-06-21 LAB — PROTIME-INR
INR: 1
Prothrombin Time: 10.4 s (ref 9.0–11.5)

## 2017-06-21 LAB — HEPATITIS B SURFACE ANTIBODY,QUALITATIVE: HEP B S AB: NONREACTIVE

## 2017-06-21 LAB — HEPATITIS B SURFACE ANTIGEN: HEP B S AG: NONREACTIVE

## 2017-06-22 LAB — HEPATITIS C GENOTYPE: HCV Genotype: 3

## 2017-06-26 LAB — LIVER FIBROSIS, FIBROTEST-ACTITEST
ALT: 436 U/L — ABNORMAL HIGH (ref 6–29)
APOLIPOPROTEIN A1: 125 mg/dL (ref 101–198)
Alpha-2-Macroglobulin: 236 mg/dL (ref 106–279)
Bilirubin: 0.3 mg/dL (ref 0.2–1.2)
Fibrosis Score: 0.41
GGT: 227 U/L — ABNORMAL HIGH (ref 3–70)
Haptoglobin: 171 mg/dL (ref 43–212)
NECROINFLAMMAT ACT SCORE: 0.95
Reference ID: 2410911

## 2017-06-26 NOTE — Congregational Nurse Program (Signed)
Congregational Nurse Program Note  Date of Encounter: 06/23/2017  Past Medical History: Past Medical History:  Diagnosis Date  . Alzheimer disease   . Anxiety   . Bilateral pneumonia 01/2017   per new patient packet   . COPD (chronic obstructive pulmonary disease) (HCC) 01/2017   per new patient packet   . Depression   . Fatty liver   . Gallbladder disease 2014   per new patient packet   . Hernia, abdominal   . Hypertension   . Liver damage 01/2017   per new patient packet, Dr.Kadakia  . Normal cardiac stress test 2007  . Ovarian cyst 1977   8 1/2 lb left ovarian cyst, Dr.Cox, per new patient packet   . Pneumonia   . Right ovarian cyst    1992, 1993, 1994, 1995, and 1996 Dr.Neil, per new patient packet   . Stroke [facility](HCC)    2 strokes.     Encounter Details: CNP Questionnaire - 06/26/17 2250      Questionnaire   Patient Status  Not Applicable    Race  White or Caucasian    Location Patient Served At  [facility] patient    Insurance  Medicaid    Uninsured  Not Applicable    Food  No food insecurities    Housing/Utilities  No permanent housing    Transportation  Yes, need transportation assistance;Within past 12 months, lack of transportation negatively impacted life    Interpersonal Safety  Yes, feel physically and emotionally safe where you currently live    Medication  No medication insecurities    Medical Provider  Yes    Referrals  Primary Care Provider/Clinic;Area Agency    ED Visit Averted  Not Applicable    Life-Saving Intervention Made  Not Applicable     [facility] team visiting today client states she feels okay ,hasa slight cough ,jumped right in to tell team about her visit to Infectious Disease clinic states it wasn't like she thought with a big sign that states Infectious Disease . Client more accepting of her condition and what she needs to do. States blood work done  To determine her specific Hept C will return 06-27-17 to get results and know what medication she  will be placed on. She will also get her x=ray of her back on the same day at the Imagining dept.  Bone density scheduled for 07-19-17 and will return to see PCP 07-21-17. Client complaining today about ankle/ leg  pain ? If it continues/ Will discuss with PCP on next visit Is taking a walk around the building and walks her dog . SW reviewed more housing options with client ,nurse has been assisting with options also . [facility] ,client was okay with that ,will see about getting her car on the road so she would have transportation if she has to move out of area. Team will continue to search for housing options and follow up with client . Will request additional time in [facility]. .Marland Kitchen

## 2017-06-27 ENCOUNTER — Ambulatory Visit
Admission: RE | Admit: 2017-06-27 | Discharge: 2017-06-27 | Disposition: A | Discharge status: Home / Self Care | Payer: Medicare HMO | Source: Ambulatory Visit | Attending: Internal Medicine | Admitting: Internal Medicine

## 2017-06-27 ENCOUNTER — Encounter: Payer: Self-pay | Admitting: Infectious Diseases

## 2017-06-27 ENCOUNTER — Ambulatory Visit (INDEPENDENT_AMBULATORY_CARE_PROVIDER_SITE_OTHER): Payer: Medicare HMO | Admitting: Infectious Diseases

## 2017-06-27 VITALS — BP 149/88 | HR 88 | Temp 98.2°F | Ht 62.0 in | Wt 114.0 lb

## 2017-06-27 DIAGNOSIS — B182 Chronic viral hepatitis C: Secondary | ICD-10-CM

## 2017-06-27 DIAGNOSIS — Z23 Encounter for immunization: Secondary | ICD-10-CM | POA: Diagnosis not present

## 2017-06-27 DIAGNOSIS — M545 Low back pain, unspecified: Secondary | ICD-10-CM

## 2017-06-27 DIAGNOSIS — G8929 Other chronic pain: Secondary | ICD-10-CM

## 2017-06-27 MED ORDER — GLECAPREVIR-PIBRENTASVIR 100-40 MG PO TABS: 3.0000 | ORAL_TABLET | Freq: Every day | ORAL | 1 refills | Status: DC

## 2017-06-27 NOTE — Progress Notes (Signed)
HPI: Brittany [name] is a 62 y.o. female is here for her f/u hep C visit with Judeth Cornfield.   Lab Results  Component Value Date   HCVGENOTYPE 3 06/20/2017    Allergies: Allergies  Allergen Reactions  . Amoxicillin Hives     Has patient had a PCN reaction causing immediate rash, facial/tongue/throat swelling, SOB or lightheadedness with hypotension: Yes hives Has patient had a PCN reaction causing severe rash involving mucus membranes or skin necrosis: No Has patient had a PCN reaction that required hospitalization No Has patient had a PCN reaction occurring within the last 10 years: Yes If all of the above answers are "NO", then may proceed with Cephalosporin use.   . Sulfonamide Derivatives Nausea And Vomiting  . Wellbutrin [Bupropion] Other (See Comments)    Lightheaded Diarrhea    Vitals: Temp: 98.2 F (36.8 C) (04/09 1340) Temp Source: Oral (04/09 1340) BP: 149/88 (04/09 1340) Pulse Rate: 88 (04/09 1340)  Past Medical History: Past Medical History:  Diagnosis Date  . Alzheimer disease   . Anxiety   . Bilateral pneumonia 01/2017   per new patient packet   . COPD (chronic obstructive pulmonary disease) (HCC) 01/2017   per new patient packet   . Depression   . Fatty liver   . Gallbladder disease 2014   per new patient packet   . Hernia, abdominal   . Hypertension   . Liver damage 01/2017   per new patient packet, Dr.Kadakia  . Normal cardiac stress test 2007  . Ovarian cyst 1977   8 1/2 lb left ovarian cyst, Dr.Cox, per new patient packet   . Pneumonia   . Right ovarian cyst    1992, 1993, 1994, 1995, and 1996 Dr.Neil, per new patient packet   . Stroke [facility])    2 strokes.     Social History: Social History   Socioeconomic History  . Marital status: Divorced    Spouse name: Not on file  . Number of children: 1  . Years of education: Not on file  . Highest education level: Not on file  Occupational History  . Occupation: disabled  Social Needs  .  Financial resource strain: Not on file  . Food insecurity:    Worry: Not on file    Inability: Not on file  . Transportation needs:    Medical: Not on file    Non-medical: Not on file  Tobacco Use  . Smoking status: Current Every Day Smoker    Packs/day: 0.75    Years: 46.00    Pack years: 34.50    Types: Cigarettes  . Smokeless tobacco: Never Used  . Tobacco comment: Takes 2-3 puffs when smoking, 4 cigarettes a day   Substance and Sexual Activity  . Alcohol use: No    Frequency: Never  . Drug use: No  . Sexual activity: Never    Comment: last partner ex husbanc 8 years ago   Lifestyle  . Physical activity:    Days per week: Not on file    Minutes per session: Not on file  . Stress: Not on file  Relationships  . Social connections:    Talks on phone: Not on file    Gets together: Not on file    Attends religious service: Not on file    Active member of club or organization: Not on file    Attends meetings of clubs or organizations: Not on file    Relationship status: Not on file  Other Topics Concern  .  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 No   Do you have difficulty preparing food or eating? No   Do you have difficulty managing your medications? No   Do you have difficulty managing your finances? No   Do you have difficulty affording your medications? Yes Inhalers     Labs: Hep B S Ab (no units)  Date Value  06/20/2017 NON-REACTIVE   Hepatitis B Surface Ag (no units)  Date Value  06/20/2017  NON-REACTIVE    Lab Results  Component Value Date   HCVGENOTYPE 3 06/20/2017    Hepatitis C RNA quantitative Latest Ref Rng & Units 05/26/2017  HCV Quantitative Log NOT DETECT Log IU/mL 7.08(H)    AST (U/L)  Date Value  05/26/2017 104 (H)  04/11/2017 78 (H)  03/08/2017 221 (H)   ALT (U/L)  Date Value  06/20/2017 436 (H)  05/26/2017 130 (H)  04/11/2017 126 (H)  03/08/2017 335 (H)   INR (no units)  Date Value  06/20/2017 1.0  11/28/2016 1.03    CrCl: CrCl cannot be calculated (Patient's most recent lab result is older than the maximum 21 days allowed.).  Fibrosis Score: <F4  Child-Pugh Score: Class A  Previous Treatment Regimen: None  Assessment: Brittany [name] is here today to discuss therapy for her hep C. She has geno 3 without cirrhosis. She is still currently on Protonix for her GERD. This would be an issue with the Epclusa so we are going to use Mavyret instead x8 wks. Brittany [name] will file for the approval today. Once she starts on therapy, we will bring her back two weeks later to check for tolerability and labs. She will get the PNA and hep B #1 today. We will start the hep B#2 and the hep A series at the pharmacy visit.   Counseled her on how to take Mavyret, side effects, and the importance of adherence to therapy, especially for genotype 3. She does have transportation issue. Advised her that the pharmacy can mail it to her.   Recommendations:  Mavyret 3 daily x 8 wks F/u with pharmacy for labs and cure Start hep A vac at next visit, hep B#2  Brittany [name], 1700 Rainbow BoulevardPharm.D., BCPS, AAHIVP Clinical Infectious Disease Pharmacist [facility] 06/27/2017, 2:23 PM

## 2017-06-27 NOTE — Assessment & Plan Note (Addendum)
Chronic Hepatitis C, Genotype 3, treatment naive. No lesions or cirrhosis on ultrasound and minimal fibrosis. Will start her on [place] for 8 week course of treatment considering her genotype and DDI's. She also met with [facility] today regarding medication s/e, adherence instructions and follow up necessary while on treatment.   Pneumovax and Hep B#1 today. She will need Hep B#2 and Hep A #1 at upcoming appointment in 4 weeks.

## 2017-06-27 NOTE — Progress Notes (Signed)
[place] [facility] Infectious Disease   Name: Brittany [name] DOB: Nov 19, 1955  MRN: [id number]  PCP: Gildardo Cranker, DO  Referring Provider: Dr. Fuller Plan  GI Provider: Dr. Fuller Plan ([facility])   CC: treatment for chronic hepatitis C  HPI: Brittany [name] is a 62 y.o. female who presents for follow up on chronic hepatitis C.  Patient tested positive during outpatient work up for elevated LFTs in January of this year with positive antibody and VL of 11,900,000 copies. Hepatitis C-associated risk factors present are: sexual contact with her husband of 25 years whom she recalls was Hep C positive and possible blood transfusions required during hysterectomy d/t significant bleeding more than 20 years ago. Patient denies accidental needle stick, intranasal drug use, IV drug abuse, multiple sexual partners, renal dialysis, tattoos. During her work up of her elevated LFTs she has had an abdominal ultrasound showing slightly echogenic liver parenchyma that may indicate some fatty infiltration but no focal abnormality or mass. She is working with Dr. Fuller Plan and was also found to have a slightly elevated AFP and no evidence of cirrhosis >> plan on repeating AFP I a few months. During this time it was discovered her HCV Ab was positive. Patient has not had prior treatment for Hepatitis C. Patient does not have a past history of liver disease. Patient does not have a family history of liver disease.   HPI: Brittany [name] is here for follow up on her labs and plan for Hep C Tx. She is feeling well today aside from ongoing nausea. She is hopeful that treating her hepatitis c infection it will resolve this. Never had pneumonia vaccine and would like to receive this. No associated signs or symptoms related to liver disease aside from nausea which may be medication related per our discussion.      Review of Systems  Constitutional: Negative for chills and fever.  HENT: Negative for tinnitus.   Eyes: Negative for blurred vision  and photophobia.  Respiratory: Negative for cough and sputum production.   Cardiovascular: Negative for chest pain.  Gastrointestinal: Positive for abdominal pain (mild epigastric ) and nausea. Negative for diarrhea and vomiting.  Genitourinary: Negative for dysuria.  Skin: Negative for rash.  Neurological: Negative for headaches.    Past Medical History:  Diagnosis Date  . Alzheimer disease   . Anxiety   . Bilateral pneumonia 01/2017   per new patient packet   . COPD (chronic obstructive pulmonary disease) ([place]) 01/2017   per new patient packet   . Depression   . Fatty liver   . Gallbladder disease 2014   per new patient packet   . Hernia, abdominal   . Hypertension   . Liver damage 01/2017   per new patient packet, Dr.Kadakia  . Normal cardiac stress test 2007  . Ovarian cyst 1977   8 1/2 lb left ovarian cyst, Dr.Cox, per new patient packet   . Pneumonia   . Right ovarian cyst    1992, 1993, 1994, 1995, and 1996 Dr.Neil, per new patient packet   . Stroke [facility])    2 strokes.    Outpatient Medications Prior to Visit  Medication Sig Dispense Refill  . albuterol (PROVENTIL HFA;VENTOLIN HFA) 108 (90 Base) MCG/ACT inhaler Inhale 2 puffs into the lungs every 6 (six) hours as needed for wheezing or shortness of breath.    . budesonide-formoterol (SYMBICORT) 160-4.5 MCG/ACT inhaler Inhale 12 puffs into the lungs daily.     . citalopram (CELEXA) 10 MG tablet Take  1 tablet (10 mg total) by mouth daily. For mood 30 tablet 6  . feeding supplement, ENSURE ENLIVE, (ENSURE ENLIVE) LIQD Take 237 mLs by mouth 2 (two) times daily between meals. 60 Bottle 6  . gabapentin (NEURONTIN) 300 MG capsule Take 1 capsule (300 mg total) by mouth 2 (two) times daily. For nerve pain 60 capsule 6  . losartan (COZAAR) 50 MG tablet Take 1 tablet (50 mg total) daily by mouth. 30 tablet 1  . nitroGLYCERIN (NITROSTAT) 0.4 MG SL tablet Place 1 tablet (0.4 mg total) under the tongue every 5 (five) minutes x 3  doses as needed for chest pain (chest pain). 25 tablet 1  . pantoprazole (PROTONIX) 40 MG tablet Take 1 tablet (40 mg total) by mouth daily. 30 tablet 3  . potassium chloride (K-DUR) 10 MEQ tablet Take 1 tablet (10 mEq total) by mouth every Monday, Wednesday, and Friday. 30 tablet 3  . prochlorperazine (COMPAZINE) 5 MG tablet Take 1 tablet (5 mg total) by mouth every 6 (six) hours as needed for refractory nausea / vomiting. 56 tablet 0  . buPROPion (WELLBUTRIN SR) 150 MG 12 hr tablet     . B Complex-C (B-COMPLEX WITH VITAMIN C) tablet Take 1 tablet by mouth daily.     No facility-administered medications prior to visit.     Allergies  Allergen Reactions  . Amoxicillin Hives     Has patient had a PCN reaction causing immediate rash, facial/tongue/throat swelling, SOB or lightheadedness with hypotension: Yes hives Has patient had a PCN reaction causing severe rash involving mucus membranes or skin necrosis: No Has patient had a PCN reaction that required hospitalization No Has patient had a PCN reaction occurring within the last 10 years: Yes If all of the above answers are "NO", then may proceed with Cephalosporin use.   . Sulfonamide Derivatives Nausea And Vomiting  . Wellbutrin [Bupropion] Other (See Comments)    Lightheaded Diarrhea    Social History   Tobacco Use  . Smoking status: Current Every Day Smoker    Packs/day: 0.75    Years: 46.00    Pack years: 34.50    Types: Cigarettes  . Smokeless tobacco: Never Used  . Tobacco comment: Takes 2-3 puffs when smoking, 4 cigarettes a day   Substance Use Topics  . Alcohol use: No    Frequency: Never  . Drug use: No    Family History  Problem Relation Age of Onset  . Stroke Mother   . COPD Mother   . Dementia Mother   . Breast cancer Mother   . Diabetes Father   . Heart attack Father   . Diabetes Sister   . Stroke Sister        x2  . Hepatitis C Daughter   . Diabetes Paternal Grandmother     Objective:    Constitutional: in no apparent distress, well developed and well nourished, alert and normal vitals,  Vitals:   06/27/17 1340  BP: (!) 149/88  Pulse: 88  Temp: 98.2 F (36.8 C)    Laboratory Genotype:  Lab Results  Component Value Date   HCVGENOTYPE 3 06/20/2017    HCV viral load: 11,900,000 copies 05/26/17  Hep A and B non-immune based on 05/26/17 labs   Lab Results  Component Value Date   WBC 5.7 06/20/2017   HGB 13.6 06/20/2017   HCT 39.2 06/20/2017   MCV 86.2 06/20/2017   PLT 215 06/20/2017    Lab Results  Component Value Date  CREATININE 0.60 03/08/2017   BUN 15 03/08/2017   NA 137 03/08/2017   K 4.3 03/08/2017   CL 101 03/08/2017   CO2 27 03/08/2017    Lab Results  Component Value Date   ALT 436 (H) 06/20/2017   AST 104 (H) 05/26/2017   ALKPHOS 59 05/26/2017    Lab Results  Component Value Date   INR 1.0 06/20/2017   BILITOT 0.6 05/26/2017   ALBUMIN 4.3 05/26/2017   Labs and history reviewed and show CHILD-PUGH A  5-6 points: Child class A 7-9 points: Child class B 10-15 points: Child class C  Assessment & Plan:   Problem List Items Addressed This Visit      Digestive   Chronic viral hepatitis C ([place])    Chronic Hepatitis C, Genotype 3, treatment naive. No lesions or cirrhosis on ultrasound and minimal fibrosis. Will start her on [place] for 8 week course of treatment considering her genotype and DDI's. She also met with [facility] today regarding medication s/e, adherence instructions and follow up necessary while on treatment.   Pneumovax and Hep B#1 today. She will need Hep B#2 and Hep A #1 at upcoming appointment in 4 weeks.       Relevant Medications   Glecaprevir-Pibrentasvir (MAVYRET) 100-40 MG TABS   Other Relevant Orders   Hepatitis B vaccine adult IM (Completed)   Pneumococcal polysaccharide vaccine 23-valent greater than or equal to 2yo subcutaneous/IM (Completed)    Other Visit Diagnoses    Need for hepatitis B vaccination    -   Primary   Relevant Orders   Hepatitis B vaccine adult IM (Completed)   Need for pneumococcal vaccination       Relevant Orders   Pneumococcal polysaccharide vaccine 23-valent greater than or equal to 2yo subcutaneous/IM (Completed)     I spent 34mnutes with the patient including greater than 50% of time in face to face counsel of the patient re management of chronic hep c, lab results/imaging results, vaccines, treatment plan and in coordination of their care.  SJanene Madeira MSN, NP-C [facility] Office: [phone]: [phone] 06/27/17 8:01 PM

## 2017-06-28 ENCOUNTER — Other Ambulatory Visit: Payer: Self-pay | Admitting: Pharmacist

## 2017-06-28 ENCOUNTER — Encounter: Payer: Self-pay | Admitting: Pharmacy Technician

## 2017-06-28 ENCOUNTER — Telehealth: Payer: Self-pay | Admitting: Pharmacist

## 2017-06-28 MED FILL — MAVYRET 100-40 MG TABS: 100-40 | 28 days supply | Qty: 84 | Fill #0

## 2017-06-28 NOTE — Telephone Encounter (Signed)
Thank you very much, Brittany [name].

## 2017-06-28 NOTE — Telephone Encounter (Signed)
Brittany [name] has been approved for 8 weeks of Mavyret for her Hep C infection.  Spoke to her on the phone today and counseled her on how to take it - 3 tablets at once one time a daily with food. Encouraged her not to miss any doses and to take it around the same time each day.  Educated her on the possible side effects such as headaches and fatigue. No drug interactions to worry about. She will follow-up with me on 4/30.

## 2017-07-01 ENCOUNTER — Other Ambulatory Visit: Payer: Self-pay | Admitting: Internal Medicine

## 2017-07-01 DIAGNOSIS — R110 Nausea: Secondary | ICD-10-CM

## 2017-07-03 NOTE — Telephone Encounter (Signed)
A medication refill was received from pharmacy for compazine 5 mg. Prescription was forwarded to provider for approval.

## 2017-07-04 NOTE — Congregational Nurse Program (Signed)
Congregational Nurse Program Note  Date of Encounter: 06/28/2017  Past Medical History: Past Medical History:  Diagnosis Date  . Alzheimer disease   . Anxiety   . Bilateral pneumonia 01/2017   per new patient packet   . COPD (chronic obstructive pulmonary disease) ([facility]) 01/2017   per new patient packet   . Depression   . Fatty liver   . Gallbladder disease 2014   per new patient packet   . Hernia, abdominal   . Hypertension   . Liver damage 01/2017   per new patient packet, Dr.Kadakia  . Normal cardiac stress test 2007  . Ovarian cyst 1977   8 1/2 lb left ovarian cyst, Dr.Cox, per new patient packet   . Pneumonia   . Right ovarian cyst    1992, 1993, 1994, 1995, and 1996 Dr.Neil, per new patient packet   . Stroke [facility])    2 strokes.     Encounter Details: CNP Questionnaire - 07/04/17 1728      Questionnaire   Patient Status  Not Applicable    Race  White or Caucasian    Location Patient Served At  Not Applicable    Insurance  Medicaid    Uninsured  Not Applicable    Food  No food insecurities    Housing/Utilities  No permanent housing    Transportation  Yes, need transportation assistance;Within past 12 months, lack of transportation negatively impacted life    Interpersonal Safety  Yes, feel physically and emotionally safe where you currently live    Medication  No medication insecurities    Medical Provider  Yes    Referrals  Primary Care Provider/Clinic;Area Agency    ED Visit Averted  Not Applicable    Life-Saving Intervention Made  Not Applicable      Clinical Intake - 06/27/17 1346      Nutrition Screen   Diabetes  No      Functional Status   Activities of Daily Living  Independent    Ambulation  Independent    Medication Administration  Independent    Home Management  Independent      Abuse/Neglect   Do you feel unsafe in your current relationship?  No    Do you feel physically threatened by others?  No    Anyone hurting you at home, work, or  school?  No    Unable to ask?  No      Web designer Needed?  No     Client states she is out of sorts today . Has misplaced her calendar and cant   Remember where it is . Hopes her daughter accidentally picked it up. Was seen in ID clinic and feels okay with everything . Diagnosed at a level 3. Will see pharmacist on 07-18-17 to be placed on medication. Had her pneumonia  And Hep t B vaccine today. Will get Shingles at next visit. States her Hep.  Treatment is about at 95% success rate  Treatment is for 8weeks . States her Hep . C shouldn't be causing her nausea ?? Will need endoscopic exam to determine  What may be going on . Her weight was 114 lbs, 149/88 was her blood pressure. Will see [facility]  09-12-17, May 3.2019   [facility]  [facility] care, ,07-19-17 , ID clinic. Housing options reviewed again and client has several to follow up on . Team will continue to look at options.  Nurse will request extension into HOPES program for  2-3 more weeks as we work to meet the clients needs. Next visit 07-05-17 @ 7 pm

## 2017-07-08 ENCOUNTER — Other Ambulatory Visit: Payer: Self-pay | Admitting: Internal Medicine

## 2017-07-08 DIAGNOSIS — G609 Hereditary and idiopathic neuropathy, unspecified: Secondary | ICD-10-CM

## 2017-07-10 ENCOUNTER — Other Ambulatory Visit: Payer: Self-pay

## 2017-07-10 DIAGNOSIS — G609 Hereditary and idiopathic neuropathy, unspecified: Secondary | ICD-10-CM

## 2017-07-10 MED ORDER — GABAPENTIN 300 MG PO CAPS
300.0000 mg | ORAL_CAPSULE | Freq: Two times a day (BID) | ORAL | 6 refills | Status: DC
Start: 2017-07-10 — End: 2017-12-06

## 2017-07-11 ENCOUNTER — Other Ambulatory Visit: Payer: Self-pay | Admitting: Internal Medicine

## 2017-07-11 DIAGNOSIS — R110 Nausea: Secondary | ICD-10-CM

## 2017-07-12 NOTE — Telephone Encounter (Signed)
A medication refill was received from pharmacy for compazine 5 mg. Refill request was pended to provider for approval.

## 2017-07-12 NOTE — Congregational Nurse Program (Signed)
Congregational Nurse Program Note  Date of Encounter: 07/12/2017  Past Medical History: Past Medical History:  Diagnosis Date  . Alzheimer disease   . Anxiety   . Bilateral pneumonia 01/2017   per new patient packet   . COPD (chronic obstructive pulmonary disease) ([facility]) 01/2017   per new patient packet   . Depression   . Fatty liver   . Gallbladder disease 2014   per new patient packet   . Hernia, abdominal   . Hypertension   . Liver damage 01/2017   per new patient packet, Dr.Kadakia  . Normal cardiac stress test 2007  . Ovarian cyst 1977   8 1/2 lb left ovarian cyst, Dr.Cox, per new patient packet   . Pneumonia   . Right ovarian cyst    1992, 1993, 1994, 1995, and 1996 Dr.Neil, per new patient packet   . Stroke [facility])    2 strokes.     Encounter Details: CNP Questionnaire - 07/12/17 1941      Questionnaire   Patient Status  Not Applicable    Race  White or Caucasian    Location Patient Served At  HOPES patient    Insurance  Medicaid    Uninsured  Not Applicable    Food  No food insecurities    Housing/Utilities  No permanent housing    Transportation  Yes, need transportation assistance;Within past 12 months, lack of transportation negatively impacted life    Interpersonal Safety  Yes, feel physically and emotionally safe where you currently live    Medication  No medication insecurities    Medical Provider  Yes    Referrals  Primary Care Provider/Clinic    ED Visit Averted  Not Applicable    Life-Saving Intervention Made  Not Applicable      Clinical Intake - 06/27/17 1346      Nutrition Screen   Diabetes  No      Functional Status   Activities of Daily Living  Independent    Ambulation  Independent    Medication Administration  Independent    Home Management  Independent      Abuse/Neglect   Do you feel unsafe in your current relationship?  No    Do you feel physically threatened by others?  No    Anyone hurting you at home, work, or school?  No    Unable to ask?  No      Web designerLanguage Assistant   Interpreter Needed?  No     HOPES team visit finds client doing well. States she feels better now than she has felt in a long time. Nausea is better ,not gone but better. X-ray of back reveals per client some arthritis she states ,no fractures. Will see PCP in another week to discuss any pain medication for back as she states pain is worrisome . States he return visit to ID clinic pharmacy went well she was put on Mavyret Animal nutritionist( Glecaprevir Pibrentasvir ) a week ago and has done well on it. Nurse reviewed side effects and gave the client a sheet on things to look for . Has been pleased with her care at ID clinic. Scheduled for bone density and PCP follow up next week .  Housing options reviewed and gave client  List of follows she can do . Housing is presenting major problems for client due to income requirements. Informed us that the daughter will not be moving with her and she is okay with that. She has been extended in the  HOPES program until 07-28-17  Next visit week of 07/17/17

## 2017-07-18 ENCOUNTER — Ambulatory Visit (INDEPENDENT_AMBULATORY_CARE_PROVIDER_SITE_OTHER): Payer: Medicare HMO | Admitting: Pharmacist

## 2017-07-18 DIAGNOSIS — Z23 Encounter for immunization: Secondary | ICD-10-CM | POA: Diagnosis not present

## 2017-07-18 DIAGNOSIS — B182 Chronic viral hepatitis C: Secondary | ICD-10-CM

## 2017-07-18 NOTE — Progress Notes (Signed)
HPI: Brittany [name] is a pleasant 62 y.o. female who presents to [facility] pharmacy clinic for Hep C treatment follow up.  Lab Results  Component Value Date   HCVGENOTYPE 3 06/20/2017    Allergies: Allergies  Allergen Reactions  . Amoxicillin Hives     Has patient had a PCN reaction causing immediate rash, facial/tongue/throat swelling, SOB or lightheadedness with hypotension: Yes hives Has patient had a PCN reaction causing severe rash involving mucus membranes or skin necrosis: No Has patient had a PCN reaction that required hospitalization No Has patient had a PCN reaction occurring within the last 10 years: Yes If all of the above answers are "NO", then may proceed with Cephalosporin use.   . Sulfonamide Derivatives Nausea And Vomiting  . Wellbutrin [Bupropion] Other (See Comments)    Lightheaded Diarrhea    Vitals:    Past Medical History: Past Medical History:  Diagnosis Date  . Alzheimer disease   . Anxiety   . Bilateral pneumonia 01/2017   per new patient packet   . COPD (chronic obstructive pulmonary disease) ([facility]) 01/2017   per new patient packet   . Depression   . Fatty liver   . Gallbladder disease 2014   per new patient packet   . Hernia, abdominal   . Hypertension   . Liver damage 01/2017   per new patient packet, Dr.Kadakia  . Normal cardiac stress test 2007  . Ovarian cyst 1977   8 1/2 lb left ovarian cyst, Dr.Cox, per new patient packet   . Pneumonia   . Right ovarian cyst    1992, 1993, 1994, 1995, and 1996 Dr.Neil, per new patient packet   . Stroke [facility])    2 strokes.     Social History: Social History   Socioeconomic History  . Marital status: Divorced    Spouse name: Not on file  . Number of children: 1  . Years of education: Not on file  . Highest education level: Not on file  Occupational History  . Occupation: disabled  Social Needs  . Financial resource strain: Not on file  . Food insecurity:    Worry: Not on file    Inability:  Not on file  . Transportation needs:    Medical: Not on file    Non-medical: Not on file  Tobacco Use  . Smoking status: Current Every Day Smoker    Packs/day: 0.75    Years: 46.00    Pack years: 34.50    Types: Cigarettes  . Smokeless tobacco: Never Used  . Tobacco comment: Takes 2-3 puffs when smoking, 4 cigarettes a day   Substance and Sexual Activity  . Alcohol use: No    Frequency: Never  . Drug use: No  . Sexual activity: Never    Comment: last partner ex husbanc 8 years ago   Lifestyle  . Physical activity:    Days per week: Not on file    Minutes per session: Not on file  . Stress: Not on file  Relationships  . Social connections:    Talks on phone: Not on file    Gets together: Not on file    Attends religious service: Not on file    Active member of club or organization: Not on file    Attends meetings of clubs or organizations: Not on file    Relationship status: Not on file  Other Topics Concern  . 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 No   Do you have difficulty preparing food or eating? No   Do you have difficulty managing your medications? No   Do you have difficulty managing your finances? No   Do you have difficulty affording your medications? Yes Inhalers     Labs: Hep B S Ab (no units)  Date Value  06/20/2017 NON-REACTIVE   Hepatitis B Surface Ag (no units)  Date Value  06/20/2017 NON-REACTIVE    Lab Results  Component Value Date   HCVGENOTYPE 3 06/20/2017    Hepatitis C  RNA quantitative Latest Ref Rng & Units 05/26/2017  HCV Quantitative Log NOT DETECT Log IU/mL 7.08(H)    AST (U/L)  Date Value  05/26/2017 104 (H)  04/11/2017 78 (H)  03/08/2017 221 (H)   ALT (U/L)  Date Value  06/20/2017 436 (H)  05/26/2017 130 (H)  04/11/2017 126 (H)  03/08/2017 335 (H)   INR (no units)  Date Value  06/20/2017 1.0  11/28/2016 1.03    CrCl: CrCl cannot be calculated (Patient's most recent lab result is older than the maximum 21 days allowed.).  Fibrosis Score: F1/2 as assessed by FibroTest   Child-Pugh Score: Class A  Previous Treatment Regimen: None  Assessment: Brittany [name] is here to follow up for Hep C treatment after starting Mavryet x 8 weeks about 2 weeks ago. She likes the [employer] and states she hasn't missed any doses. She denies headaches but has been a little more tired than usual and takes more naps, but this is not bothering her too much. We explained that she needs the 2nd Hep B vaccine and 1st Hep A vaccine and she agreed to get them today. She had some pain with the vaccine administration and got slightly dizzy afterward but felt better after a few minutes. Her liver enzymes have been elevated so we will get a CMP today.   Recommendations: Continue Mavryet x 8 weeks total HCV RNA and CMP today Gave Hep A #1 and Hep B #2 vaccines today F/u with pharmacy clinic 6/11 at 1:30 pm for EOT visit   Kathyrn Sheriff, PharmD PGY1 Pharmacy Resident [facility] for Infectious Disease 07/18/17 2:19 PM

## 2017-07-19 ENCOUNTER — Ambulatory Visit
Admission: RE | Admit: 2017-07-19 | Discharge: 2017-07-19 | Disposition: A | Discharge status: Home / Self Care | Payer: Medicare HMO | Source: Ambulatory Visit | Attending: Internal Medicine | Admitting: Internal Medicine

## 2017-07-19 DIAGNOSIS — E2839 Other primary ovarian failure: Secondary | ICD-10-CM

## 2017-07-19 LAB — COMPREHENSIVE METABOLIC PANEL
AG Ratio: 1.7 (calc) (ref 1.0–2.5)
ALBUMIN MSPROF: 4.6 g/dL (ref 3.6–5.1)
ALKALINE PHOSPHATASE (APISO): 75 U/L (ref 33–130)
ALT: 13 U/L (ref 6–29)
AST: 19 U/L (ref 10–35)
BILIRUBIN TOTAL: 0.8 mg/dL (ref 0.2–1.2)
BUN: 14 mg/dL (ref 7–25)
CALCIUM: 9.9 mg/dL (ref 8.6–10.4)
CO2: 26 mmol/L (ref 20–32)
Chloride: 100 mmol/L (ref 98–110)
Creat: 0.73 mg/dL (ref 0.50–1.05)
Globulin: 2.7 g/dL (calc) (ref 1.9–3.7)
Glucose, Bld: 97 mg/dL (ref 65–99)
POTASSIUM: 4.1 mmol/L (ref 3.5–5.3)
Sodium: 135 mmol/L (ref 135–146)
Total Protein: 7.3 g/dL (ref 6.1–8.1)

## 2017-07-20 LAB — HEPATITIS C RNA QUANTITATIVE
HCV QUANT LOG: NOT DETECTED {Log_IU}/mL
HCV RNA, PCR, QN: NOT DETECTED [IU]/mL

## 2017-07-21 ENCOUNTER — Encounter: Payer: Self-pay | Admitting: Internal Medicine

## 2017-07-21 ENCOUNTER — Ambulatory Visit (INDEPENDENT_AMBULATORY_CARE_PROVIDER_SITE_OTHER): Payer: Medicare HMO | Admitting: Internal Medicine

## 2017-07-21 ENCOUNTER — Telehealth: Payer: Self-pay | Admitting: Infectious Diseases

## 2017-07-21 VITALS — BP 102/66 | HR 83 | Temp 97.7°F | Ht 62.0 in | Wt 114.0 lb

## 2017-07-21 DIAGNOSIS — G609 Hereditary and idiopathic neuropathy, unspecified: Secondary | ICD-10-CM

## 2017-07-21 DIAGNOSIS — B1920 Unspecified viral hepatitis C without hepatic coma: Secondary | ICD-10-CM | POA: Diagnosis not present

## 2017-07-21 DIAGNOSIS — F411 Generalized anxiety disorder: Secondary | ICD-10-CM

## 2017-07-21 DIAGNOSIS — M816 Localized osteoporosis [Lequesne]: Secondary | ICD-10-CM

## 2017-07-21 DIAGNOSIS — F322 Major depressive disorder, single episode, severe without psychotic features: Secondary | ICD-10-CM

## 2017-07-21 NOTE — Patient Instructions (Addendum)
Continue current medications as ordered - may take ibuprofen  2 tabs no more than 2 times daily  Follow up with ID as scheduled  Follow up in 3 mos for neuropathy, MDD, GAD, hep C

## 2017-07-21 NOTE — Telephone Encounter (Signed)
Attempted to call patient to inform her of recent labs. Her Hep C viral load is undetectable. Her medication is working well for her and continue taking it.

## 2017-07-21 NOTE — Progress Notes (Signed)
Patient ID: Brittany [name], female   DOB: 04/02/1955, 62 y.o.   MRN: [id number]   Location:  [facility] OFFICE  Provider: DR Elmon Kirschner  Code Status:  Goals of Care:  Advanced Directives 04/05/2017  Does Patient Have a Medical Advance Directive? No  Would patient like information on creating a medical advance directive? Yes (MAU/Ambulatory/Procedural Areas - Information given)     Chief Complaint  Patient presents with  . Medical Management of Chronic Issues    1 month follow-up on depression, arthritis (refer to back xray) and discuss bone density results   . Medication Management    Patient does not use symbicort daily as directed, patient uses about every 3 weeks   . Medication Refill    No refills needed     HPI: Patient is a 62 y.o. female seen today for f/u depression and arthritis.  L spine xray revealed degenerative changes but no acute process. Mood improved on citalopram. Neuropathy resolved with gabapentin increased dose. Her daughter sneaks in/out hotel room.   DXA scan 07/19/17 showed osteoporosis at left femur neck with T score -3.7  Hep C - HCV RNA quant <15; HCV log <1.18. Followed by ID. Takes mavyret; LFTs now nml   Past Medical History:  Diagnosis Date  . Alzheimer disease   . Anxiety   . Bilateral pneumonia 01/2017   per new patient packet   . COPD (chronic obstructive pulmonary disease) (HCC) 01/2017   per new patient packet   . Depression   . Fatty liver   . Gallbladder disease 2014   per new patient packet   . Hernia, abdominal   . Hypertension   . Liver damage 01/2017   per new patient packet, Dr.Kadakia  . Normal cardiac stress test 2007  . Ovarian cyst 1977   8 1/2 lb left ovarian cyst, Dr.Cox, per new patient packet   . Pneumonia   . Right ovarian cyst    1992, 1993, 1994, 1995, and 1996 Dr.Neil, per new patient packet   . Stroke [facility])    2 strokes.     Past Surgical History:  Procedure Laterality Date  . ABDOMINAL HYSTERECTOMY Bilateral     Total  . CESAREAN SECTION    . CHOLECYSTECTOMY    . INGUINAL HERNIA REPAIR Right   . LEFT OOPHORECTOMY  1977   with cyst removal, age 3  . OVARIAN CYST REMOVAL Right 93, 94,95, 96  . UMBILICAL HERNIA REPAIR       reports that she has been smoking cigarettes.  She has a 34.50 pack-year smoking history. She has never used smokeless tobacco. She reports that she does not drink alcohol or use drugs. Social History   Socioeconomic History  . Marital status: Divorced    Spouse name: Not on file  . Number of children: 1  . Years of education: Not on file  . Highest education level: Not on file  Occupational History  . Occupation: disabled  Social Needs  . Financial resource strain: Not on file  . Food insecurity:    Worry: Not on file    Inability: Not on file  . Transportation needs:    Medical: Not on file    Non-medical: Not on file  Tobacco Use  . Smoking status: Current Every Day Smoker    Packs/day: 0.75    Years: 46.00    Pack years: 34.50    Types: Cigarettes  . Smokeless tobacco: Never Used  . Tobacco comment: Takes 2-3  puffs when smoking, 4 cigarettes a day   Substance and Sexual Activity  . Alcohol use: No    Frequency: Never  . Drug use: No  . Sexual activity: Never    Comment: last partner ex husbanc 8 years ago   Lifestyle  . Physical activity:    Days per week: Not on file    Minutes per session: Not on file  . Stress: Not on file  Relationships  . Social connections:    Talks on phone: Not on file    Gets together: Not on file    Attends religious service: Not on file    Active member of club or organization: Not on file    Attends meetings of clubs or organizations: Not on file    Relationship status: Not on file  . Intimate partner violence:    Fear of current or ex partner: Not on file    Emotionally abused: Not on file    Physically abused: Not on file    Forced sexual activity: Not on file  Other Topics Concern  . 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 No   Do you have difficulty preparing food or eating? No   Do you have difficulty managing your medications? No   Do you have difficulty managing your finances? No   Do you have difficulty affording your medications? Yes Inhalers     Family History  Problem Relation Age of Onset  . Stroke Mother   . COPD Mother   . Dementia Mother   . Breast cancer Mother   . Diabetes Father   . Heart attack Father   . Diabetes Sister   . Stroke Sister        x2  . Hepatitis C Daughter   . Diabetes Paternal Grandmother     Allergies  Allergen Reactions  . Amoxicillin Hives     Has patient had a PCN reaction causing immediate rash, facial/tongue/throat swelling, SOB or lightheadedness with hypotension: Yes hives Has patient had a PCN reaction causing severe rash involving mucus membranes or skin necrosis: No Has patient had a PCN reaction that required hospitalization No Has patient had a PCN reaction occurring within the last 10 years: Yes If all of the above answers are "NO", then may proceed with Cephalosporin use.   . Sulfonamide Derivatives Nausea And Vomiting  . Wellbutrin [Bupropion] Other (See Comments)    Lightheaded Diarrhea    Outpatient Encounter Medications as of 07/21/2017  Medication Sig  . albuterol (PROVENTIL HFA;VENTOLIN HFA) 108 (90 Base) MCG/ACT inhaler Inhale 2 puffs into the lungs every 6 (six) hours as needed for wheezing or shortness of breath.  . budesonide-formoterol (SYMBICORT)  160-4.5 MCG/ACT inhaler Inhale 12 puffs into the lungs daily.   . citalopram (CELEXA) 10 MG tablet Take 1  tablet (10 mg total) by mouth daily. For mood  . feeding supplement, ENSURE ENLIVE, (ENSURE ENLIVE) LIQD Take 237 mLs by mouth 2 (two) times daily between meals.  . gabapentin (NEURONTIN) 300 MG capsule Take 1 capsule (300 mg total) by mouth 2 (two) times daily. For nerve pain  . Glecaprevir-Pibrentasvir (MAVYRET) 100-40 MG TABS Take 3 tablets by mouth daily with breakfast.  . losartan (COZAAR) 50 MG tablet Take 1 tablet (50 mg total) daily by mouth.  . nitroGLYCERIN (NITROSTAT) 0.4 MG SL tablet Place 1 tablet (0.4 mg total) under the tongue every 5 (five) minutes x 3 doses as needed for chest pain (chest pain).  . pantoprazole (PROTONIX) 40 MG tablet Take 1 tablet (40 mg total) by mouth daily.  . potassium chloride (K-DUR) 10 MEQ tablet Take 1 tablet (10 mEq total) by mouth every Monday, Wednesday, and Friday.  . prochlorperazine (COMPAZINE) 5 MG tablet TAKE 1 TABLET (5 MG TOTAL) BY MOUTH EVERY 6 (SIX) HOURS AS NEEDED FOR REFRACTORY NAUSEA / VOMITING.  . [DISCONTINUED] B Complex-C (B-COMPLEX WITH VITAMIN C) tablet Take 1 tablet by mouth daily.   No facility-administered encounter medications on file as of 07/21/2017.     Review of Systems:  Review of Systems  All other systems reviewed and are negative.   Health Maintenance  Topic Date Due  . PAP SMEAR  02/18/1979  . MAMMOGRAM  06/14/2018 (Originally 06/08/2015)  . COLONOSCOPY  06/14/2018 (Originally 02/18/2008)  . INFLUENZA VACCINE  10/19/2017  . TETANUS/TDAP  03/21/2024  . Hepatitis C Screening  Completed  . HIV Screening  Completed    Physical Exam: Vitals:   07/21/17 1134  BP: 102/66  Pulse: 83  Temp: 97.7 F (36.5 C)  TempSrc: Oral  SpO2: 97%  Weight: 114 lb (51.7 kg)  Height:  (1.575 m)   Body mass index is 20.85 kg/m. Physical Exam  Constitutional: She appears well-developed and well-nourished.  Skin: Skin  is warm and dry. No rash noted.  Psychiatric: She has a normal mood and affect. Her behavior is normal. Judgment and thought content normal.    Labs reviewed: Basic Metabolic Panel: Recent Labs    01/29/17 0515 01/30/17 0450  02/01/17 0437  02/18/17 0845 03/08/17 1231 07/18/17 1414  NA 143 142   < > 138   < > 136 137 135  K 2.7* 5.3*   < > 4.6   < > 4.2 4.3 4.1  CL 106 110   < > 105   < > 104 101 100  CO2 22 22   < > 25   < > GLUCOSE 183* 191*   < > 181*   < > 136* 83 97  BUN 24* 45*   < > 49*   < > CREATININE 0.75 0.78   < > 0.73   < > 0.67 0.60 0.73  CALCIUM 9.1 9.3   < > 8.7*   < > 9.0 9.4 9.9  MG 1.9 2.3  --  2.4  --   --   --   --    < > = values in this interval not displayed.   Liver Function Tests: Recent Labs    01/28/17 0514 02/16/17 1829  04/11/17 1534 05/26/17 1406 06/20/17 0955 07/18/17 1414  AST 15 392*   < > 78* 104*  --  19  ALT 14 1,330*   < > 126* 130* 436* 13  ALKPHOS 56 162*  --   --  59  --   --   BILITOT 0.5 4.8*   < > 0.8 0.6  --  0.8  PROT 6.2* 5.4*   < > 7.2 7.3  --  7.3  ALBUMIN 3.1* 2.8*  --   --  4.3  --   --    < > = values in this interval not displayed.   No results for input(s): LIPASE, AMYLASE in the last 8760 hours. No results for input(s): AMMONIA in the last 8760 hours. CBC: Recent Labs    02/01/17 0437 02/16/17 1829 06/20/17 0956  WBC 23.8* 5.9 5.7  NEUTROABS  --  3.5  --   HGB 14.6 12.0 13.6  HCT 43.2 35.0* 39.2  MCV 89.4 86.8 86.2  PLT 271 183 215   Lipid Panel: No results for input(s): CHOL, HDL, LDLCALC, TRIG, CHOLHDL, LDLDIRECT in the last 8760 hours. Lab Results  Component Value Date   HGBA1C 5.6 05/16/2017    Procedures since last visit: Dg Lumbar Spine Complete  Result Date: 06/28/2017 CLINICAL DATA:  Chronic low back pain EXAM: LUMBAR SPINE - COMPLETE 4+ VIEW COMPARISON:  None. FINDINGS: Degenerative facet disease in the mid and lower lumbar spine. Disc spaces maintained. Mild  rightward scoliosis at the thoracolumbar junction. No fracture or subluxation. Calcifications in the aorta. No visible aneurysm. IMPRESSION: Degenerative facet disease.  No acute bony abnormality. Aortic atherosclerosis. Electronically Signed   By: Charlett Nose M.D.   On: 06/28/2017 08:56   Dg Bone Density (dxa)  Result Date: 07/19/2017 EXAM: DUAL X-RAY ABSORPTIOMETRY (DXA) FOR BONE MINERAL DENSITY IMPRESSION: Referring Physician:  Jarrette Dehner PATIENT: Name: Brittany, [name] Patient ID: [id number] Birth Date: November 11, 1955 Height: 61.0 in. Sex: Female Measured: 07/19/2017 Weight: 113.0 lbs. Indications: Albuterol, Bilateral Ovariectomy (65.51), Caucasian, Celexa, Estrogen Deficient, Gabapentin, Hysterectomy, Pantoprazole, Postmenopausal, Tobacco User (Current Smoker) Fractures: None Treatments: None ASSESSMENT: The BMD measured at Femur Neck Left is 0.524 g/cm2 with a T-score of -3.7. This patient is considered OSTEOPOROTIC according to World Health Organization [facility]) criteria. This scan is considered good quality. Site Region Measured Date Measured Age YA T-score BMD Significant CHANGE AP Spine  L1-L4     9/19/2017    59.4         -3.7    0.738 g/cm2 DualFemur Neck Left 9/19/2017    59.4         -3.7    0.524 g/cm2 World Health Organization [facility]) criteria for post-menopausal, Caucasian Women: Normal       T-score at or above -1 SD Osteopenia   T-score between -1 and -2.5 SD Osteoporosis T-score at or below -2.5 SD RECOMMENDATION: 1.  All patients should optimize calcium and vitamin D intake. 2. Consider FDA approved medical therapies in postmenopausal women and men aged 40 years and older, based on the following: a. A hip or vertebral (clinical or morphometric) fracture b. T- score < or = -2.5 at the femoral neck or spine after appropriate evaluation to exclude secondary causes c. Low bone mass (T-score between -1.0 and -2.5 at the femoral neck or spine) and a 10 year probability of a hip fracture > or = 3% or a  10 year probability of a major osteoporosis-related fracture > or = 20% based on the [place]-adapted WHO algorithm d. Clinician judgment and/or patient preferences may indicate treatment for people with 10-year fracture probabilities above or below these levels FOLLOW-UP: People with diagnosed cases of osteoporosis or at high risk for fracture should have regular bone mineral density tests.  For patients eligible for Medicare, routine testing is allowed once every 2 years. The testing frequency can be increased to one year for patients who have rapidly progressing disease, those who are receiving or discontinuing medical therapy to restore bone mass, or have additional risk factors. I have reviewed this report and agree with the above findings. Mark A. Tyron Russell, M.D. [facility] Electronically Signed   By: Ulyses Southward M.D.   On: 07/19/2017 14:58    Assessment/Plan     ICD-10-CM   1. Localized osteoporosis without current pathological fracture M81.6   2. Current severe episode of major depressive disorder without psychotic features, unspecified whether recurrent (HCC) F32.2   3. GAD (generalized anxiety disorder) F41.1   4. Idiopathic peripheral neuropathy G60.9   5. Hepatitis C virus infection without hepatic coma, unspecified chronicity B19.20    undetectable   Continue current medications as ordered - may take ibuprofen  2 tabs no more than 2 times daily  Follow up with ID as scheduled  Follow up in 3 mos for neuropathy, MDD, GAD, hep C    Desi Carby S. Ancil Linsey  [facility] [address] [place], [place] [address] [phone] Cell (Monday-Friday 8 AM - 5 PM) [phone] After 5 PM and follow prompts

## 2017-07-25 MED FILL — MAVYRET 100-40 MG TABS: 100-40 | 28 days supply | Qty: 84 | Fill #1

## 2017-07-28 ENCOUNTER — Telehealth: Payer: Self-pay | Admitting: *Deleted

## 2017-07-28 MED ORDER — ALENDRONATE SODIUM 70 MG PO TABS: ORAL_TABLET | ORAL | 6 refills | Status: DC

## 2017-07-28 MED ORDER — ALENDRONATE SODIUM 70 MG PO TABS: 70.0000 mg | ORAL_TABLET | ORAL | 6 refills | Status: DC

## 2017-07-28 NOTE — Telephone Encounter (Signed)
Rx was supposed to be sent to pharmacy when pt notified of results 07/19/17 per result note. Please send as ordered 07/19/17

## 2017-07-28 NOTE — Telephone Encounter (Signed)
Medication list updated. Medication faxed to pharmacy. Patient notified and agreed.

## 2017-07-28 NOTE — Telephone Encounter (Signed)
Patient called and stated that she just saw you on 07/21/2017. Stated that she thought you were going to call in a Rx for Fosamax to her pharmacy but she couldn't remember. Stated that she checked with her pharmacy but they had not received anything. Patient is wondering if this was something you wanted her to start taking now or wait. Please Advise.

## 2017-07-31 NOTE — Congregational Nurse Program (Signed)
Congregational Nurse Program Note  Date of Encounter: 07/27/2017  Past Medical History: Past Medical History:  Diagnosis Date  . Alzheimer disease   . Anxiety   . Bilateral pneumonia 01/2017   per new patient packet   . COPD (chronic obstructive pulmonary disease) (HCC) 01/2017   per new patient packet   . Depression   . Fatty liver   . Gallbladder disease 2014   per new patient packet   . Hernia, abdominal   . Hypertension   . Liver damage 01/2017   per new patient packet, Dr.Kadakia  . Normal cardiac stress test 2007  . Ovarian cyst 1977   8 1/2 lb left ovarian cyst, Dr.Cox, per new patient packet   . Pneumonia   . Right ovarian cyst    1992, 1993, 1994, 1995, and 1996 Dr.Neil, per new patient packet   . Stroke [facility])    2 strokes.     Encounter Details: CNP Questionnaire - 07/31/17 2353      Questionnaire   Patient Status  Not Applicable    Race  White or Caucasian    Location Patient Served At  Not Applicable    Insurance  Medicaid    Uninsured  Not Applicable    Food  No food insecurities    Housing/Utilities  No permanent housing    Transportation  Within past 12 months, lack of transportation negatively impacted life;Yes, need transportation assistance    Interpersonal Safety  Yes, feel physically and emotionally safe where you currently live    Medication  No medication insecurities    Medical Provider  Yes    Referrals  Medicaid    ED Visit Averted  Not Applicable    Life-Saving Intervention Made  Not Applicable     HOPES team follow up visit.Client in preparation  For upcoming room change due to renovations that are being done to all rooms in her complex.  Doing well today otherwise . Taking medication for Hep. C doing well no side effects so far and states recent blood work results were un -dectable . States she had her bone density test went well and will be put bon fosamax.Received her second shot for Hept. B.  Extension in HOPES explained to client  until 08-18-17  Housing discussed with client ,client had followed up on all leads but no success ,due to limited income and need to make 2-3 times her rent none of the possibilities available were options for her. Some would not accept her pet . Team will clarify with [facility] on its application requirements as client wasn't sure about them .  Several more options given to client to follow up this upcoming week .  Clients  Physical health stable at present if team can locate housing .  Plans for follow up visit next week

## 2017-08-09 ENCOUNTER — Telehealth: Payer: Self-pay

## 2017-08-09 NOTE — Congregational Nurse Program (Signed)
Congregational Nurse Program Note  Date of Encounter: 08/07/2017  Past Medical History: Past Medical History:  Diagnosis Date  . Alzheimer disease   . Anxiety   . Bilateral pneumonia 01/2017   per new patient packet   . COPD (chronic obstructive pulmonary disease) (HCC) 01/2017   per new patient packet   . Depression   . Fatty liver   . Gallbladder disease 2014   per new patient packet   . Hernia, abdominal   . Hypertension   . Liver damage 01/2017   per new patient packet, Dr.Kadakia  . Normal cardiac stress test 2007  . Ovarian cyst 1977   8 1/2 lb left ovarian cyst, Dr.Cox, per new patient packet   . Pneumonia   . Right ovarian cyst    1992, 1993, 1994, 1995, and 1996 Dr.Neil, per new patient packet   . Stroke [facility])    2 strokes.     Encounter Details: CNP Questionnaire - 08/09/17 1912      Questionnaire   Patient Status  Not Applicable    Race  White or Caucasian    Location Patient Served At  Not Applicable    Insurance  Medicaid    Uninsured  Not Applicable    Food  No food insecurities    Housing/Utilities  No permanent housing    Transportation  Yes, need transportation assistance;Within past 12 months, lack of transportation negatively impacted life    Interpersonal Safety  Yes, feel physically and emotionally safe where you currently live    Medication  No medication insecurities    Medical Provider  Yes    Referrals  Primary Care Provider/Clinic    ED Visit Averted  Not Applicable    Life-Saving Intervention Made  Not Applicable      HOPES team visit finds client moved to room 221 ,doing fairly well. States she has been feeling just a  little funny the last several days ,states she had to move by herself ,daughter wasn't available to assist on this past  Wednesday and weather has been hot and went for a walk without her 02 . Maybe she did too much ?? Counsel on humidity and need  For 02 with her at all times and her phone in case she gets into breathing  difficulties Client understands . Took a nitro pill several hours before team visit ,no other symptoms ,gets anxious about thinking of heart attack when she feels like this . No SOB noted ,02 on and off during visit . Attended Church on Sunday also. Has taken her final dosage of Hept. C medications ,experienced no side effects.  Housing still a major concern,haas followed up on leads given her and more were given today . Has one possibility for around June 1,all mothers income not high enough.  Client knows to call if symptoms continue her PCP or if needed 911. Next visit 08-18-17 Will need to request another extension the first of the week  .

## 2017-08-09 NOTE — Telephone Encounter (Signed)
Client answered phone,no other symptoms  states she is feeling well Thanks nurse for call  Gave client additional housing leads to follow up on  Will continue to search for suitable housing.

## 2017-08-29 ENCOUNTER — Ambulatory Visit (INDEPENDENT_AMBULATORY_CARE_PROVIDER_SITE_OTHER): Payer: Medicare HMO | Admitting: Pharmacist

## 2017-08-29 DIAGNOSIS — B182 Chronic viral hepatitis C: Secondary | ICD-10-CM | POA: Diagnosis not present

## 2017-08-29 NOTE — Progress Notes (Signed)
HPI: Brittany [name] is a 62 y.o. female who presents to the [facility] today for her HepC EOT visit.   Lab Results  Component Value Date   HCVGENOTYPE 3 06/20/2017   Allergies: Allergies  Allergen Reactions  . Amoxicillin Hives     Has patient had a PCN reaction causing immediate rash, facial/tongue/throat swelling, SOB or lightheadedness with hypotension: Yes hives Has patient had a PCN reaction causing severe rash involving mucus membranes or skin necrosis: No Has patient had a PCN reaction that required hospitalization No Has patient had a PCN reaction occurring within the last 10 years: Yes If all of the above answers are "NO", then may proceed with Cephalosporin use.   . Sulfonamide Derivatives Nausea And Vomiting  . Wellbutrin [Bupropion] Other (See Comments)    Lightheaded Diarrhea   Past Medical History: Past Medical History:  Diagnosis Date  . Alzheimer disease   . Anxiety   . Bilateral pneumonia 01/2017   per new patient packet   . COPD (chronic obstructive pulmonary disease) (HCC) 01/2017   per new patient packet   . Depression   . Fatty liver   . Gallbladder disease 2014   per new patient packet   . Hernia, abdominal   . Hypertension   . Liver damage 01/2017   per new patient packet, Dr.Kadakia  . Normal cardiac stress test 2007  . Ovarian cyst 1977   8 1/2 lb left ovarian cyst, Dr.Cox, per new patient packet   . Pneumonia   . Right ovarian cyst    1992, 1993, 1994, 1995, and 1996 Dr.Neil, per new patient packet   . Stroke [facility])    2 strokes.    Social History: Social History   Socioeconomic History  . Marital status: Divorced    Spouse name: Not on file  . Number of children: 1  . Years of education: Not on file  . Highest education level: Not on file  Occupational History  . Occupation: disabled  Social Needs  . Financial resource strain: Not on file  . Food insecurity:    Worry: Not on file    Inability: Not on file  . Transportation  needs:    Medical: Not on file    Non-medical: Not on file  Tobacco Use  . Smoking status: Current Every Day Smoker    Packs/day: 0.75    Years: 46.00    Pack years: 34.50    Types: Cigarettes  . Smokeless tobacco: Never Used  . Tobacco comment: Takes 2-3 puffs when smoking, 4 cigarettes a day   Substance and Sexual Activity  . Alcohol use: No    Frequency: Never  . Drug use: No  . Sexual activity: Never    Comment: last partner ex husbanc 8 years ago   Lifestyle  . Physical activity:    Days per week: Not on file    Minutes per session: Not on file  . Stress: Not on file  Relationships  . Social connections:    Talks on phone: Not on file    Gets together: Not on file    Attends religious service: Not on file    Active member of club or organization: Not on file    Attends meetings of clubs or organizations: Not on file    Relationship status: Not on file  Other Topics Concern  . 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 No   Do you have difficulty preparing food or eating? No   Do you have difficulty managing your medications? No   Do you have difficulty managing your finances? No   Do you have difficulty affording your medications? Yes Inhalers    Labs: Hep B S Ab (no units)  Date Value  06/20/2017 NON-REACTIVE   Hepatitis B Surface Ag (no units)  Date Value  06/20/2017 NON-REACTIVE   Lab Results  Component Value Date   HCVGENOTYPE 3 06/20/2017   Hepatitis C RNA quantitative Latest Ref Rng &  Units 07/18/2017 05/26/2017  HCV Quantitative Log NOT DETECT Log IU/mL <1.18 NOT DETECTED 7.08(H)   AST (U/L)  Date Value  07/18/2017 19  05/26/2017 104 (H)  04/11/2017 78 (H)   ALT (U/L)  Date Value  07/18/2017 13  06/20/2017 436 (H)  05/26/2017 130 (H)  04/11/2017 126 (H)   INR (no units)  Date Value  06/20/2017 1.0  11/28/2016 1.03   CrCl: CrCl cannot be calculated (Patient's most recent lab result is older than the maximum 21 days allowed.).  Fibrosis Score: F1/2 as assessed by FibroTest   Child-Pugh Score: Class A  Previous Treatment Regimen: None   Assessment: Brittany [name] is here for her EOT Hep C treatment visit. She completed her Mavryet x8 weeks, with her last dose yesterday 08/28/17. She has not missed any doses. She tolerated the treatment well. She had no remaining questions or concerns.  Recommendations: HCV RNA today  Due for Hep A #2 and Hep B #3 next visit  SVR12 visit and vaccines: 9/17 @ 1:45 PM with Brittany [name]   Hershal Coriaiana L Halyn Flaugher, Pharm.D., MS PGY1 Pharmacy Resident  [facility] 08/29/2017, 1:55 PM

## 2017-08-30 NOTE — Congregational Nurse Program (Signed)
[facility] Client answered door, dressed and welcomed nurse in.   This nurse had spoken with client on Saturday June 8 re: a visit to check B/P as she had reported that it had been [facility]but reports that [facility] and had it checked and it was normal. Planned to restart her B/P medication on Sunday June 9.  Client declined a visit as she was on her way out.  Stated that She would see me on Wednesady. Today client reports feeling well. Wears O2 at night and whenever needed during the day, states that as it gets warmer she does need it more during the day.  Was seen at the ID clinic on yesterday and had blood work done, has completed her treatment for Hep C and does not need to return to clinic for 6 months. Reports taking Alendronate 70mg  every week. Takes it on different days.  Encouraged to take it the same day every week. Also restarted Losartan potassium 50mg  QD. B/P 130/82 P-80 R-18 Breath sounds clear. Plan: Visit next Wednesday. Ivana Nicastro D. Azucena Kubaeid RN Congregational Nurse [phone]  Congregational Nurse Program Note  Date of Encounter: 08/30/2017  Past Medical History: Past Medical History:  Diagnosis Date  . Alzheimer disease   . Anxiety   . Bilateral pneumonia 01/2017   per new patient packet   . COPD (chronic obstructive pulmonary disease) (HCC) 01/2017   per new patient packet   . Depression   . Fatty liver   . Gallbladder disease 2014   per new patient packet   . Hernia, abdominal   . Hypertension   . Liver damage 01/2017   per new patient packet, Dr.Kadakia  . Normal cardiac stress test 2007  . Ovarian cyst 1977   8 1/2 lb left ovarian cyst, Dr.Cox, per new patient packet   . Pneumonia   . Right ovarian cyst    1992, 1993, 1994, 1995, and 1996 Dr.Neil, per new patient packet   . Stroke [facility](HCC)    2 strokes.     Encounter Details: CNP Questionnaire -  08/30/17 2110      Questionnaire   Patient Status  Not Applicable    Race  White or Caucasian    Location Patient Served At  [facility] patient    Insurance  Medicaid    Uninsured  Not Applicable    Food  No food insecurities    Housing/Utilities  No permanent housing    Transportation  Yes, need transportation assistance;Within past 12 months, lack of transportation negatively impacted life    Interpersonal Safety  Yes, feel physically and emotionally safe where you currently live    Medication  No medication insecurities    Medical Provider  Yes    Referrals  Primary Care Provider/Clinic;Not Applicable    ED Visit Averted  Not Applicable    Life-Saving Intervention Made  Not Applicable

## 2017-08-31 LAB — HEPATITIS C RNA QUANTITATIVE
HCV QUANT LOG: NOT DETECTED {Log_IU}/mL
HCV RNA, PCR, QN: NOT DETECTED [IU]/mL

## 2017-09-10 NOTE — Congregational Nurse Program (Signed)
[facility]  Client out talking with another guest, followed nurse and SW to room.  Room smells of cigarette smoke.   Client proceeds to get oxygen and puts it on.  States that humidity causes her to need it more. Client begins to tell nurse and SW about multiple doctors appointments that she has set up. One for a hernia that is now hurting, one for pain in calf of both legs which is an issue that she's had before. Issue was not due to a clot, but thinks it was restless leg syndrome, also complains of a cough that started months ago, and lastly needs to see a dermatologist regarding a skin rash to face. B/P 128/80 P-80 R-16. Lung sound clear, no redness or swelling to lower legs, no pain when nurse gently palpates. Client is aware that she shouldn't be smoking.  Client also complains of dental issues and teeth that need attention.  She has a dental consult next week.  Nurse informed client of discharge date from hotel which has been extended to July 19.  Long discussion regarding leads on apartments.  SW provide a list and discussed rent amounts.  Client to follow up and call SW re: leads. Plan to visit client again on next Wednesday Ulys Favia D. Azucena Kubaeid RN  [phone] Congregational Nurse Program Note  Date of Encounter: 09/06/2017  Past Medical History: Past Medical History:  Diagnosis Date  . Alzheimer disease   . Anxiety   . Bilateral pneumonia 01/2017   per new patient packet   . COPD (chronic obstructive pulmonary disease) (HCC) 01/2017   per new patient packet   . Depression   . Fatty liver   . Gallbladder disease 2014   per new patient packet   . Hernia, abdominal   . Hypertension   . Liver damage 01/2017   per new patient packet, Dr.Kadakia  . Normal cardiac stress test 2007  . Ovarian cyst 1977   8 1/2 lb left ovarian cyst, Dr.Cox, per new patient packet   . Pneumonia   . Right ovarian cyst    1992, 1993, 1994, 1995, and 1996 Dr.Neil, per new patient packet   . Stroke  [facility](HCC)    2 strokes.     Encounter Details: CNP Questionnaire - 09/06/17 1535      Questionnaire   Patient Status  Not Applicable    Race  White or Caucasian    Location Patient Served At  [facility] patient    Insurance  Medicaid    Uninsured  Not Applicable    Food  No food insecurities    Housing/Utilities  No permanent housing    Transportation  No transportation needs    Interpersonal Safety  Yes, feel physically and emotionally safe where you currently live    Medication  No medication insecurities    Medical Provider  Yes    Referrals  Other    ED Visit Averted  Not Applicable    Life-Saving Intervention Made  Not Applicable

## 2017-09-12 ENCOUNTER — Ambulatory Visit: Payer: Medicare HMO | Admitting: Internal Medicine

## 2017-09-12 ENCOUNTER — Ambulatory Visit: Payer: Medicare HMO | Admitting: Pulmonary Disease

## 2017-09-17 NOTE — Congregational Nurse Program (Signed)
[facility] to see client who was dressed in room with her dog Sheryle Hailiamond. Client went to put on her oxygen after nurse arrived.  States that with the high temperatures she has to wear oxygen more.  Reports that she did see a dentist on last week for a consult and that he placed her on an antibiotic for infection.  Nurse noted Clindamycin HCL 150mg  4x/day. Dentist told client she needed to have 6 extractions and that it would cost her $9,000.00 client of course states she can not afford this.  Nurse will check into a free dental clinic and get back with client.  Nurse inquired about client smoking and she states that she smokes 4 cigs/day. She has tried to quit however at this time she continues to keep it down to the 4/day.  Nurse suggested her calling the QUIT line. client agreed.  Client continues to complain of soreness in the calf of both legs when she walks a lot. No swelling, reddnes or warmth to either leg. Client has an appointment with her PCP on July 3 and will talk with her about it.  She also states that she has an appt. With the dermotologist on July 10 although she's thinking about canceling it doesn't know if it's worth it.  Client reports again that [facility] and [facility] was 90/50 and that she felt dizzy.  B/P today 126/78 P-72 regular Lungs with clear breath sounds. Client reports that she did not take her B/P med for two days (Thursday and Friday) but will start back taking on today. Client reports that she spoke with a Landlord re: apartments she has been looking at and that he said there would be an opening the first of July.  Client plans to keep in contact with him.  She also states that she is going to her sister's house later today and hopes to get help with her password so that [facility]  Nurse explained to client of her need to continue actively finding a place to live besides the motel and following up  on all the leads that the SW gives her. Plan:: Visit next week, check on dental referral.  Cola Gane D. Azucena Kubaeid CN [phone] Nurse Program Note  Date of Encounter: 09/17/2017  Past Medical History: Past Medical History:  Diagnosis Date  . Alzheimer disease   . Anxiety   . Bilateral pneumonia 01/2017   per new patient packet   . COPD (chronic obstructive pulmonary disease) (HCC) 01/2017   per new patient packet   . Depression   . Fatty liver   . Gallbladder disease 2014   per new patient packet   . Hernia, abdominal   . Hypertension   . Liver damage 01/2017   per new patient packet, Dr.Kadakia  . Normal cardiac stress test 2007  . Ovarian cyst 1977   8 1/2 lb left ovarian cyst, Dr.Cox, per new patient packet   . Pneumonia   . Right ovarian cyst    1992, 1993, 1994, 1995, and 1996 Dr.Neil, per new patient packet   . Stroke [facility](HCC)    2 strokes.     Encounter Details: CNP Questionnaire - 09/15/17 1526      Questionnaire   Patient Status  Not Applicable    Race  White or Caucasian    [facility]    Insurance  Medicaid    Uninsured  Not Applicable    Food  No food insecurities    Housing/Utilities  No permanent housing    Transportation  No transportation needs    Interpersonal Safety  Yes, feel physically and emotionally safe where you currently live    Medication  No medication insecurities    Medical Provider  Yes    Referrals  Other    ED Visit Averted  Not Applicable    Life-Saving Intervention Made  Not Applicable

## 2017-09-20 ENCOUNTER — Ambulatory Visit (INDEPENDENT_AMBULATORY_CARE_PROVIDER_SITE_OTHER): Payer: Medicare HMO | Admitting: Internal Medicine

## 2017-09-20 ENCOUNTER — Encounter: Payer: Self-pay | Admitting: Internal Medicine

## 2017-09-20 VITALS — BP 138/84 | HR 108 | Temp 98.0°F | Ht 62.0 in | Wt 107.0 lb

## 2017-09-20 DIAGNOSIS — R1033 Periumbilical pain: Secondary | ICD-10-CM

## 2017-09-20 DIAGNOSIS — K439 Ventral hernia without obstruction or gangrene: Secondary | ICD-10-CM | POA: Diagnosis not present

## 2017-09-20 DIAGNOSIS — Z720 Tobacco use: Secondary | ICD-10-CM

## 2017-09-20 DIAGNOSIS — H9190 Unspecified hearing loss, unspecified ear: Secondary | ICD-10-CM

## 2017-09-20 DIAGNOSIS — G4762 Sleep related leg cramps: Secondary | ICD-10-CM | POA: Diagnosis not present

## 2017-09-20 DIAGNOSIS — G609 Hereditary and idiopathic neuropathy, unspecified: Secondary | ICD-10-CM | POA: Diagnosis not present

## 2017-09-20 LAB — COMPLETE METABOLIC PANEL WITH GFR
AG Ratio: 1.8 (calc) (ref 1.0–2.5)
ALBUMIN MSPROF: 4.5 g/dL (ref 3.6–5.1)
ALT: 9 U/L (ref 6–29)
AST: 13 U/L (ref 10–35)
Alkaline phosphatase (APISO): 69 U/L (ref 33–130)
BUN: 16 mg/dL (ref 7–25)
CALCIUM: 9.7 mg/dL (ref 8.6–10.4)
CO2: 24 mmol/L (ref 20–32)
CREATININE: 0.68 mg/dL (ref 0.50–1.05)
Chloride: 104 mmol/L (ref 98–110)
GFR, EST NON AFRICAN AMERICAN: 96 mL/min/{1.73_m2} (ref >=60)
GFR, Est African American: 111 mL/min/{1.73_m2} (ref >=60)
GLOBULIN: 2.5 g/dL (ref 1.9–3.7)
GLUCOSE: 95 mg/dL (ref 65–139)
Potassium: 4.3 mmol/L (ref 3.5–5.3)
SODIUM: 137 mmol/L (ref 135–146)
Total Bilirubin: 0.4 mg/dL (ref 0.2–1.2)
Total Protein: 7 g/dL (ref 6.1–8.1)

## 2017-09-20 LAB — CK: CK TOTAL: 38 U/L (ref 29–143)

## 2017-09-20 LAB — MAGNESIUM: Magnesium: 1.9 mg/dL (ref 1.5–2.5)

## 2017-09-20 NOTE — Patient Instructions (Addendum)
Will call with lab results and imaging appts/results  Avoid heavy lifting until imaging completed and resulted  Avoid NSAIDS until abdominal studies complete  May need surgery evaluation  Follow up as scheduled or sooner if need be

## 2017-09-20 NOTE — Progress Notes (Signed)
Patient ID: Brittany [name], female   DOB: 1955/11/03, 62 y.o.   MRN: [id number]   [facility] OFFICE  Provider: DR Arletha Grippe  Code Status:  Goals of Care:  Advanced Directives 04/05/2017  Does Patient Have a Medical Advance Directive? No  Would patient like information on creating a medical advance directive? Yes (MAU/Ambulatory/Procedural Areas - Information given)     Chief Complaint  Patient presents with  . Acute Visit    Patient with stomach pain and thinks it is related to a hernia and lifting of oxygen tank- possible referral   . Hearing Problem    Patient with hearing loss, patient has to asks people to repeat things often, patient would like to know what free resources are available to access   . Blood Pressure Check    Patient with variation in B/P readings, patient with several episodes of low b/p and this is abnormal for her  . Leg Problem    Restless legs and legs feel as if they are going to give out at times    HPI: Patient is a 62 y.o. female seen today for an acute visit for several concerns.   1) c/a incisional hernia - she reports burning sensation in abdomen after lifting heavy trash bag 2 weeks ago. Occasional nausea. She has had similar sx's in past with hernias  2) b/l calf weakness x 2 weeks - no numbness/tingling. She is c/a RLS. She takes KCL and gabapentin. She walks several times per day and takes the stairs when she can. No known injury. She smokes cigs.   3) she had broken tooth 2-3 weeks ago - she went to the dentist and was told she had swelling and inflammation. she was rx cleocin abx which is causing a lot of diarrhea. She was told she needs all lower teeth extracted and will cost >$9K.   She has trouble with hearing and requests resources to get free hearing test  Past Medical History:  Diagnosis Date  . Alzheimer disease   . Anxiety   . Bilateral pneumonia 01/2017   per new patient packet   . COPD (chronic obstructive pulmonary disease) ([place])  01/2017   per new patient packet   . Depression   . Fatty liver   . Gallbladder disease 2014   per new patient packet   . Hernia, abdominal   . Hypertension   . Liver damage 01/2017   per new patient packet, Dr.Kadakia  . Normal cardiac stress test 2007  . Ovarian cyst 1977   8 1/2 lb left ovarian cyst, Dr.Cox, per new patient packet   . Pneumonia   . Right ovarian cyst    1992, 1993, 1994, 1995, and 1996 Dr.Neil, per new patient packet   . Stroke [facility])    2 strokes.     Past Surgical History:  Procedure Laterality Date  . ABDOMINAL HYSTERECTOMY Bilateral    Total  . CESAREAN SECTION    . CHOLECYSTECTOMY    . INGUINAL HERNIA REPAIR Right   . LEFT OOPHORECTOMY  1977   with cyst removal, age 43  . OVARIAN CYST REMOVAL Right 93, 62,26, 96  . UMBILICAL HERNIA REPAIR       reports that she has been smoking cigarettes.  She has a 34.50 pack-year smoking history. She has never used smokeless tobacco. She reports that she does not drink alcohol or use drugs. Social History   Socioeconomic History  . Marital status: Divorced    Spouse name:  Not on file  . Number of children: 1  . Years of education: Not on file  . Highest education level: Not on file  Occupational History  . Occupation: disabled  Social Needs  . Financial resource strain: Not on file  . Food insecurity:    Worry: Not on file    Inability: Not on file  . Transportation needs:    Medical: Not on file    Non-medical: Not on file  Tobacco Use  . Smoking status: Current Every Day Smoker    Packs/day: 0.75    Years: 46.00    Pack years: 34.50    Types: Cigarettes  . Smokeless tobacco: Never Used  . Tobacco comment: Takes 2-3 puffs when smoking, 4 cigarettes a day   Substance and Sexual Activity  . Alcohol use: No    Frequency: Never  . Drug use: No  . Sexual activity: Never    Comment: last partner ex husbanc 8 years ago   Lifestyle  . Physical activity:    Days per week: Not on file    Minutes  per session: Not on file  . Stress: Not on file  Relationships  . Social connections:    Talks on phone: Not on file    Gets together: Not on file    Attends religious service: Not on file    Active member of club or organization: Not on file    Attends meetings of clubs or organizations: Not on file    Relationship status: Not on file  . Intimate partner violence:    Fear of current or ex partner: Not on file    Emotionally abused: Not on file    Physically abused: Not on file    Forced sexual activity: Not on file  Other Topics Concern  . 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 No   Do you have difficulty preparing food or eating? No   Do you have difficulty managing your medications? No   Do you have difficulty managing your finances? No   Do you have difficulty affording your medications? Yes Inhalers     Family History  Problem Relation Age of Onset  . Stroke Mother   . COPD Mother   . Dementia Mother   . Breast cancer Mother   . Diabetes Father   . Heart attack Father   . Diabetes Sister   . Stroke Sister        x2  . Hepatitis C Daughter   . Diabetes Paternal Grandmother     Allergies  Allergen Reactions  . Amoxicillin Hives     Has patient had a PCN reaction causing immediate rash, facial/tongue/throat swelling, SOB or lightheadedness with hypotension: Yes hives Has patient had a PCN reaction causing severe rash involving mucus membranes or skin necrosis: No  Has patient had a PCN reaction that  required hospitalization No Has patient had a PCN reaction occurring within the last 10 years: Yes If all of the above answers are "NO", then may proceed with Cephalosporin use.   . Sulfonamide Derivatives Nausea And Vomiting  . Wellbutrin [Bupropion] Other (See Comments)    Lightheaded Diarrhea    Outpatient Encounter Medications as of 09/20/2017  Medication Sig  . albuterol (PROVENTIL HFA;VENTOLIN HFA) 108 (90 Base) MCG/ACT inhaler Inhale 2 puffs into the lungs every 6 (six) hours as needed for wheezing or shortness of breath.  Marland Kitchen alendronate (FOSAMAX) 70 MG tablet 1 tablet by mouth weekly with full glass of water on empty stomach 30 min prior breakfast. Cannot bend over or lie flat x 60 min after dose.  . budesonide-formoterol (SYMBICORT) 160-4.5 MCG/ACT inhaler Inhale 12 puffs into the lungs daily.   . citalopram (CELEXA) 10 MG tablet Take 1 tablet (10 mg total) by mouth daily. For mood  . clindamycin (CLEOCIN) 150 MG capsule TAKE 1 CAPSULE BY MOUTH FOUR TIMES A DAY, SEPERATE DOSES BY 4-6HRS  . feeding supplement, ENSURE ENLIVE, (ENSURE ENLIVE) LIQD Take 237 mLs by mouth 2 (two) times daily between meals.  . gabapentin (NEURONTIN) 300 MG capsule Take 1 capsule (300 mg total) by mouth 2 (two) times daily. For nerve pain  . KLOR-CON M10 10 MEQ tablet Take 10 mEq by mouth every Monday, Wednesday, and Friday.  . losartan (COZAAR) 50 MG tablet Take 1 tablet (50 mg total) daily by mouth.  . nitroGLYCERIN (NITROSTAT) 0.4 MG SL tablet Place 1 tablet (0.4 mg total) under the tongue every 5 (five) minutes x 3 doses as needed for chest pain (chest pain).  . ondansetron (ZOFRAN-ODT) 4 MG disintegrating tablet Take 4 mg by mouth daily as needed.  . pantoprazole (PROTONIX) 40 MG tablet Take 1 tablet (40 mg total) by mouth daily.  . prochlorperazine (COMPAZINE) 5 MG tablet TAKE 1 TABLET (5 MG TOTAL) BY MOUTH EVERY 6 (SIX) HOURS AS NEEDED FOR REFRACTORY NAUSEA / VOMITING.  . [DISCONTINUED]  Glecaprevir-Pibrentasvir (MAVYRET) 100-40 MG TABS Take 3 tablets by mouth daily with breakfast. (Patient not taking: Reported on 08/29/2017)   No facility-administered encounter medications on file as of 09/20/2017.     Review of Systems:  Review of Systems  HENT: Positive for dental problem and hearing loss.   Gastrointestinal: Positive for abdominal pain, diarrhea and nausea.  Neurological: Positive for weakness.  All other systems reviewed and are negative.   Health Maintenance  Topic Date Due  . PAP SMEAR  02/18/1979  . MAMMOGRAM  06/14/2018 (Originally 06/08/2015)  . COLONOSCOPY  06/14/2018 (Originally 02/18/2008)  . INFLUENZA VACCINE  10/19/2017  . TETANUS/TDAP  03/21/2024  . Hepatitis C Screening  Completed  . HIV Screening  Completed    Physical Exam: Vitals:   09/20/17 0921  BP: 138/84  Pulse: (!) 108  Temp: 98 F (36.7 C)  TempSrc: Oral  SpO2: 96%  Weight: 107 lb (48.5 kg)  Height: 5' 2"  (1.575 m)   Body mass index is 19.57 kg/m. Physical Exam  Constitutional: She is oriented to person, place, and time. She appears well-developed and well-nourished.  HENT:  Mouth/Throat: Oropharynx is clear and moist. No oropharyngeal exudate.  Poor dentition; MMM  Eyes: Pupils are equal, round, and reactive to light. No scleral icterus.  Neck: Carotid bruit is not present.  Cardiovascular: Normal rate, regular rhythm, intact distal pulses and normal pulses. Exam reveals no gallop and no  friction rub.  Murmur (1/6 SEM) heard. No LE edema b/l. no calf TTP; no palpable cord  Pulmonary/Chest: Effort normal and breath sounds normal. No stridor. No respiratory distress. She has no wheezes. She has no rales.  Abdominal: Soft. Normal appearance and bowel sounds are normal. She exhibits no distension and no mass. There is no hepatomegaly. There is tenderness (midepigastric and lower quadrant). There is no rigidity, no rebound and no guarding. A hernia (periumbilical; reducible) is  present.  Neurological: She is alert and oriented to person, place, and time. She has normal reflexes. She displays no atrophy and no tremor. Gait normal.  Skin: Skin is warm and dry. No rash noted.  Psychiatric: She has a normal mood and affect. Her behavior is normal. Judgment and thought content normal.    Labs reviewed: Basic Metabolic Panel: Recent Labs    01/29/17 0515 01/30/17 0450  02/01/17 0437  02/18/17 0845 03/08/17 1231 07/18/17 1414  NA 143 142   < > 138   < > 136 137 135  K 2.7* 5.3*   < > 4.6   < > 4.2 4.3 4.1  CL 106 110   < > 105   < > 104 101 100  CO2 22 22   < > 25   < > 23 27 26   GLUCOSE 183* 191*   < > 181*   < > 136* 83 97  BUN 24* 45*   < > 49*   < > 11 15 14   CREATININE 0.75 0.78   < > 0.73   < > 0.67 0.60 0.73  CALCIUM 9.1 9.3   < > 8.7*   < > 9.0 9.4 9.9  MG 1.9 2.3  --  2.4  --   --   --   --    < > = values in this interval not displayed.   Liver Function Tests: Recent Labs    01/28/17 0514 02/16/17 1829  04/11/17 1534 05/26/17 1406 06/20/17 0955 07/18/17 1414  AST 15 392*   < > 78* 104*  --  19  ALT 14 1,330*   < > 126* 130* 436* 13  ALKPHOS 56 162*  --   --  59  --   --   BILITOT 0.5 4.8*   < > 0.8 0.6  --  0.8  PROT 6.2* 5.4*   < > 7.2 7.3  --  7.3  ALBUMIN 3.1* 2.8*  --   --  4.3  --   --    < > = values in this interval not displayed.   No results for input(s): LIPASE, AMYLASE in the last 8760 hours. No results for input(s): AMMONIA in the last 8760 hours. CBC: Recent Labs    02/01/17 0437 02/16/17 1829 06/20/17 0956  WBC 23.8* 5.9 5.7  NEUTROABS  --  3.5  --   HGB 14.6 12.0 13.6  HCT 43.2 35.0* 39.2  MCV 89.4 86.8 86.2  PLT 271 183 215   Lipid Panel: No results for input(s): CHOL, HDL, LDLCALC, TRIG, CHOLHDL, LDLDIRECT in the last 8760 hours. Lab Results  Component Value Date   HGBA1C 5.6 05/16/2017    Procedures since last visit: No results found.  Assessment/Plan   ICD-10-CM   1. Nocturnal leg cramps G47.62 CMP  with eGFR([facility])    Magnesium    CK   possible RLS  2. Hernia of abdominal wall K43.9    with hx recurrent hernias  3. Tobacco abuse Z72.0  4. Idiopathic peripheral neuropathy G60.9   5. Periumbilical abdominal pain R10.33 CMP with eGFR([facility])    CT Abdomen Pelvis Wo Contrast  6. Hearing loss, unspecified hearing loss type, unspecified laterality H91.90 Ambulatory referral to Connected Care    May need increase in gabapentin dose pending lab results  Smoking cessation discussed and highly urged  Will call with lab results  Avoid heavy lifting until imaging completed and resulted  Avoid NSAIDS until abdominal studies complete  May need surgery evaluation  Follow up as scheduled or sooner if need be   [facility] S. Perlie Gold  [facility] [address] [place], [place] [address] [phone] Cell (Monday-Friday 8 AM - 5 PM) [phone] After 5 PM and follow prompts

## 2017-09-27 ENCOUNTER — Other Ambulatory Visit: Payer: Self-pay

## 2017-09-27 ENCOUNTER — Ambulatory Visit (INDEPENDENT_AMBULATORY_CARE_PROVIDER_SITE_OTHER): Payer: Medicare HMO | Admitting: Physician Assistant

## 2017-09-27 ENCOUNTER — Encounter: Payer: Self-pay | Admitting: Physician Assistant

## 2017-09-27 VITALS — BP 117/79 | HR 104 | Temp 98.5°F | Resp 16 | Ht 62.0 in | Wt 108.4 lb

## 2017-09-27 DIAGNOSIS — R6884 Jaw pain: Secondary | ICD-10-CM

## 2017-09-27 DIAGNOSIS — R220 Localized swelling, mass and lump, head: Secondary | ICD-10-CM

## 2017-09-27 DIAGNOSIS — K089 Disorder of teeth and supporting structures, unspecified: Secondary | ICD-10-CM

## 2017-09-27 MED ORDER — HYDROCODONE-ACETAMINOPHEN 5-325 MG PO TABS
1.0000 | ORAL_TABLET | Freq: Four times a day (QID) | ORAL | 0 refills | Status: AC | PRN
Start: 2017-09-27 — End: 2017-10-02

## 2017-09-27 NOTE — Patient Instructions (Addendum)
  Continue clindamycin. Continue Ibuprofen 600-800 every eight hours along with Tylenol 650 every 6 hours.  See Dr. Suszanne Connerseoh for further advise and treatment. Call PCP to establish.    IF you received an x-ray today, you will receive an invoice from [facility]. Please contact [facility] at [phone] with questions or concerns regarding your invoice.   IF you received labwork today, you will receive an invoice from [facility]. Please contact [facility] at [phone] with questions or concerns regarding your invoice.   Our billing staff will not be able to assist you with questions regarding bills from these companies.  You will be contacted with the lab results as soon as they are available. The fastest way to get your results is to activate your My Chart account. Instructions are located on the last page of this paperwork. If you have not heard from us regarding the results in 2 weeks, please contact this office.

## 2017-09-27 NOTE — Progress Notes (Signed)
09/28/2017 1:53 PM   DOB: 06/15/1955 / MRN: [id number]  SUBJECTIVE:  Brittany [name] is a 62 y.o. female presenting for left-sided facial swelling and gingival tenderness. Symptoms present for about 5 to 6 days.  The problem is worsening. She has tried clindamycin with mild relief.  She is working with a Education officer, community at this time but tells me that to extract all of her teeth properly it caused her $9000.  She is also tried ibuprofen with some relief.  She is allergic to amoxicillin; sulfonamide derivatives; and wellbutrin [bupropion].   She  has a past medical history of Alzheimer disease, Anxiety, Bilateral pneumonia (01/2017), COPD (chronic obstructive pulmonary disease) ([facility]) (01/2017), Depression, Fatty liver, Gallbladder disease (2014), Hernia, abdominal, Hypertension, Liver damage (01/2017), Normal cardiac stress test (2007), Ovarian cyst (1977), Pneumonia, Right ovarian cyst, and Stroke ([facility]).    She  reports that she has been smoking cigarettes.  She has a 34.50 pack-year smoking history. She has never used smokeless tobacco. She reports that she does not drink alcohol or use drugs. She  reports that she does not engage in sexual activity. The patient  has a past surgical history that includes Cesarean section; Cholecystectomy; Umbilical hernia repair; Ovarian cyst removal (Right, 93, 94,95, 96); Left oophorectomy (1977); Abdominal hysterectomy (Bilateral); and Inguinal hernia repair (Right).  Her family history includes Breast cancer in her mother; COPD in her mother; Dementia in her mother; Diabetes in her father, paternal grandmother, and sister; Heart attack in her father; Hepatitis C in her daughter; Stroke in her mother and sister.  Review of Systems  Constitutional: Negative for diaphoresis.  HENT: Negative for ear pain and sore throat.   Respiratory: Negative for shortness of breath.   Cardiovascular: Negative for chest pain, orthopnea and leg swelling.  Gastrointestinal: Negative for  nausea.  Neurological: Negative for dizziness.    The problem list and medications were reviewed and updated by myself where necessary and exist elsewhere in the encounter.   OBJECTIVE:  BP 117/79   Pulse (!) 104   Temp 98.5 F (36.9 C)   Resp 16   Ht 5\' 2"  (1.575 m)   Wt 108 lb 6.4 oz (49.2 kg)   SpO2 100%   BMI 19.83 kg/m   Wt Readings from Last 3 Encounters:  09/27/17 108 lb 6.4 oz (49.2 kg)  09/20/17 107 lb (48.5 kg)  07/21/17 114 lb (51.7 kg)   Temp Readings from Last 3 Encounters:  09/27/17 98.5 F (36.9 C)  09/20/17 98 F (36.7 C) (Oral)  07/21/17 97.7 F (36.5 C) (Oral)   BP Readings from Last 3 Encounters:  09/27/17 117/79  09/20/17 138/84  07/21/17 102/66   Pulse Readings from Last 3 Encounters:  09/27/17 (!) 104  09/20/17 (!) 108  07/21/17 83    Physical Exam  Constitutional: She is oriented to person, place, and time. She appears well-developed.  HENT:  Head:    Eyes: Pupils are equal, round, and reactive to light. EOM are normal.  Cardiovascular: Normal rate.  Pulmonary/Chest: Effort normal.  Abdominal: She exhibits no distension.  Musculoskeletal: Normal range of motion.  Neurological: She is alert and oriented to person, place, and time. No cranial nerve deficit.  Skin: Skin is warm and dry. She is not diaphoretic.  Psychiatric: She has a normal mood and affect.  Vitals reviewed.   Lab Results  Component Value Date   HGBA1C 5.6 05/16/2017    Lab Results  Component Value Date   WBC 5.7  06/20/2017   HGB 13.6 06/20/2017   HCT 39.2 06/20/2017   MCV 86.2 06/20/2017   PLT 215 06/20/2017    Lab Results  Component Value Date   CREATININE 0.68 09/20/2017   BUN 16 09/20/2017   NA 137 09/20/2017   K 4.3 09/20/2017   CL 104 09/20/2017   CO2 24 09/20/2017    Lab Results  Component Value Date   ALT 9 09/20/2017   AST 13 09/20/2017   ALKPHOS 59 05/26/2017   BILITOT 0.4 09/20/2017    Lab Results  Component Value Date   TSH  1.840 07/12/2013    No results found for: CHOL, HDL, LDLCALC, LDLDIRECT, TRIG, CHOLHDL   ASSESSMENT AND PLAN:  Brittany [name] was seen today for facial swelling.  Diagnoses and all orders for this visit:  Jaw pain: Likely secondary to abscess and suspect suspect given lack of erythema and warmth that she may be responding to Keflex.  She has an allergy to penicillin and thus she is not a candidate for Augmentin.  We will treat her pain symptomatically and advised that she continue ibuprofen and add Tylenol.  Advised that she continue taking clindamycin.  In the event that she will require drainage of an abscess I have referred her to Dr. Christain Sacramentoeo  who is on for ENT today.  I hope she will go for that appointment. -     HYDROcodone-acetaminophen (NORCO) 5-325 MG tablet; Take 1 tablet by mouth every 6 (six) hours as needed for up to 5 days for severe pain (May cause constipation). For severe pain only. Do not mix with alcohol, benzodiazepines, muscle relaxer. No refills without office visit.  Facial swelling -     Ambulatory referral to ENT  Poor dentition    The patient is advised to call or return to clinic if she does not see an improvement in symptoms, or to seek the care of the closest emergency department if she worsens with the above plan.   Deliah BostonMichael Clark, MHS, PA-C [facility] at [facility] 09/28/2017 1:53 PM
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
## 2017-10-04 ENCOUNTER — Other Ambulatory Visit: Payer: Medicare HMO

## 2017-10-05 ENCOUNTER — Encounter (HOSPITAL_COMMUNITY): Payer: Self-pay | Admitting: Obstetrics and Gynecology

## 2017-10-05 ENCOUNTER — Other Ambulatory Visit: Payer: Self-pay

## 2017-10-05 ENCOUNTER — Emergency Department (HOSPITAL_COMMUNITY): Payer: Medicare HMO

## 2017-10-05 ENCOUNTER — Inpatient Hospital Stay (HOSPITAL_COMMUNITY)
Admission: EM | Admit: 2017-10-05 | Discharge: 2017-10-10 | DRG: 158 | Disposition: A | Discharge status: Home / Self Care | Payer: Medicare HMO | Attending: Internal Medicine | Admitting: Internal Medicine

## 2017-10-05 DIAGNOSIS — Z823 Family history of stroke: Secondary | ICD-10-CM | POA: Diagnosis not present

## 2017-10-05 DIAGNOSIS — R197 Diarrhea, unspecified: Secondary | ICD-10-CM | POA: Diagnosis present

## 2017-10-05 DIAGNOSIS — F329 Major depressive disorder, single episode, unspecified: Secondary | ICD-10-CM | POA: Diagnosis present

## 2017-10-05 DIAGNOSIS — F17210 Nicotine dependence, cigarettes, uncomplicated: Secondary | ICD-10-CM | POA: Diagnosis present

## 2017-10-05 DIAGNOSIS — Z825 Family history of asthma and other chronic lower respiratory diseases: Secondary | ICD-10-CM | POA: Diagnosis not present

## 2017-10-05 DIAGNOSIS — M60009 Infective myositis, unspecified site: Secondary | ICD-10-CM | POA: Diagnosis present

## 2017-10-05 DIAGNOSIS — J449 Chronic obstructive pulmonary disease, unspecified: Secondary | ICD-10-CM | POA: Diagnosis present

## 2017-10-05 DIAGNOSIS — Z8249 Family history of ischemic heart disease and other diseases of the circulatory system: Secondary | ICD-10-CM

## 2017-10-05 DIAGNOSIS — Z880 Allergy status to penicillin: Secondary | ICD-10-CM

## 2017-10-05 DIAGNOSIS — M6008 Infective myositis, other site: Secondary | ICD-10-CM

## 2017-10-05 DIAGNOSIS — K083 Retained dental root: Secondary | ICD-10-CM | POA: Diagnosis present

## 2017-10-05 DIAGNOSIS — F0280 Dementia in other diseases classified elsewhere without behavioral disturbance: Secondary | ICD-10-CM | POA: Diagnosis present

## 2017-10-05 DIAGNOSIS — Z8673 Personal history of transient ischemic attack (TIA), and cerebral infarction without residual deficits: Secondary | ICD-10-CM | POA: Diagnosis not present

## 2017-10-05 DIAGNOSIS — Z90710 Acquired absence of both cervix and uterus: Secondary | ICD-10-CM

## 2017-10-05 DIAGNOSIS — T368X5A Adverse effect of other systemic antibiotics, initial encounter: Secondary | ICD-10-CM | POA: Diagnosis present

## 2017-10-05 DIAGNOSIS — K760 Fatty (change of) liver, not elsewhere classified: Secondary | ICD-10-CM | POA: Diagnosis present

## 2017-10-05 DIAGNOSIS — Z90721 Acquired absence of ovaries, unilateral: Secondary | ICD-10-CM | POA: Diagnosis not present

## 2017-10-05 DIAGNOSIS — F17200 Nicotine dependence, unspecified, uncomplicated: Secondary | ICD-10-CM | POA: Diagnosis present

## 2017-10-05 DIAGNOSIS — L03211 Cellulitis of face: Secondary | ICD-10-CM | POA: Diagnosis not present

## 2017-10-05 DIAGNOSIS — I10 Essential (primary) hypertension: Secondary | ICD-10-CM | POA: Diagnosis present

## 2017-10-05 DIAGNOSIS — G309 Alzheimer's disease, unspecified: Secondary | ICD-10-CM | POA: Diagnosis present

## 2017-10-05 DIAGNOSIS — M272 Inflammatory conditions of jaws: Secondary | ICD-10-CM

## 2017-10-05 DIAGNOSIS — Z9981 Dependence on supplemental oxygen: Secondary | ICD-10-CM

## 2017-10-05 DIAGNOSIS — K029 Dental caries, unspecified: Secondary | ICD-10-CM | POA: Diagnosis present

## 2017-10-05 DIAGNOSIS — K047 Periapical abscess without sinus: Principal | ICD-10-CM | POA: Diagnosis present

## 2017-10-05 DIAGNOSIS — K219 Gastro-esophageal reflux disease without esophagitis: Secondary | ICD-10-CM | POA: Diagnosis present

## 2017-10-05 DIAGNOSIS — F32A Depression, unspecified: Secondary | ICD-10-CM | POA: Diagnosis present

## 2017-10-05 LAB — COMPREHENSIVE METABOLIC PANEL
ALT: 13 U/L (ref 0–44)
AST: 17 U/L (ref 15–41)
Albumin: 4.4 g/dL (ref 3.5–5.0)
Alkaline Phosphatase: 83 U/L (ref 38–126)
Anion gap: 13 (ref 5–15)
BUN: 20 mg/dL (ref 6–20)
CO2: 24 mmol/L (ref 22–32)
Calcium: 9.5 mg/dL (ref 8.9–10.3)
Chloride: 99 mmol/L (ref 98–111)
Creatinine, Ser: 0.62 mg/dL (ref 0.44–1.00)
GFR calc Af Amer: >60 mL/min (ref >60)
GFR calc non Af Amer: >60 mL/min (ref >60)
Glucose, Bld: 104 mg/dL — ABNORMAL HIGH (ref 70–99)
Potassium: 4.2 mmol/L (ref 3.5–5.1)
Sodium: 136 mmol/L (ref 135–145)
Total Bilirubin: 0.7 mg/dL (ref 0.3–1.2)
Total Protein: 8.6 g/dL — ABNORMAL HIGH (ref 6.5–8.1)

## 2017-10-05 LAB — CBC WITH DIFFERENTIAL/PLATELET
Basophils Absolute: 0 10*3/uL (ref 0.0–0.1)
Basophils Relative: 0 %
Eosinophils Absolute: 0.1 10*3/uL (ref 0.0–0.7)
Eosinophils Relative: 0 %
HEMATOCRIT: 40.7 % (ref 36.0–46.0)
HEMOGLOBIN: 14.2 g/dL (ref 12.0–15.0)
LYMPHS ABS: 1.6 10*3/uL (ref 0.7–4.0)
LYMPHS PCT: 13 %
MCH: 30.9 pg (ref 26.0–34.0)
MCHC: 34.9 g/dL (ref 30.0–36.0)
MCV: 88.7 fL (ref 78.0–100.0)
MONOS PCT: 6 %
Monocytes Absolute: 0.7 10*3/uL (ref 0.1–1.0)
NEUTROS ABS: 9.5 10*3/uL — AB (ref 1.7–7.7)
Neutrophils Relative %: 81 %
Platelets: 388 10*3/uL (ref 150–400)
RBC: 4.59 MIL/uL (ref 3.87–5.11)
RDW: 13.5 % (ref 11.5–15.5)
WBC: 11.9 10*3/uL — AB (ref 4.0–10.5)

## 2017-10-05 LAB — SEDIMENTATION RATE: Sed Rate: 56 mm/hr — ABNORMAL HIGH (ref 0–22)

## 2017-10-05 LAB — I-STAT CG4 LACTIC ACID, ED: Lactic Acid, Venous: 1.68 mmol/L (ref 0.5–1.9)

## 2017-10-05 LAB — C-REACTIVE PROTEIN: CRP: 6.7 mg/dL — AB (ref <1.0)

## 2017-10-05 MED ORDER — SENNOSIDES-DOCUSATE SODIUM 8.6-50 MG PO TABS
1.0000 | ORAL_TABLET | Freq: Every evening | ORAL | Status: DC | PRN
  Filled 2017-10-05 (×2): qty 1

## 2017-10-05 MED ORDER — OXYCODONE HCL 5 MG PO TABS
5.0000 mg | ORAL_TABLET | Freq: Four times a day (QID) | ORAL | Status: DC | PRN
Start: 2017-10-05 — End: 2017-10-10
  Administered 2017-10-05 – 2017-10-10 (×13): 5 mg via ORAL
  Filled 2017-10-05 (×14): qty 1

## 2017-10-05 MED ORDER — SODIUM CHLORIDE 0.9 % IV SOLN: INTRAVENOUS | Status: DC | PRN

## 2017-10-05 MED ORDER — LOSARTAN POTASSIUM 50 MG PO TABS
50.0000 mg | ORAL_TABLET | Freq: Every day | ORAL | Status: DC
  Administered 2017-10-06 – 2017-10-10 (×5): 50 mg via ORAL
  Filled 2017-10-05 (×5): qty 1

## 2017-10-05 MED ORDER — ONDANSETRON HCL 4 MG/2ML IJ SOLN
4.0000 mg | Freq: Four times a day (QID) | INTRAMUSCULAR | Status: DC | PRN
  Administered 2017-10-07 – 2017-10-10 (×2): 4 mg via INTRAVENOUS
  Filled 2017-10-05 (×2): qty 2

## 2017-10-05 MED ORDER — ENOXAPARIN SODIUM 40 MG/0.4ML ~~LOC~~ SOLN
40.0000 mg | SUBCUTANEOUS | Status: DC
  Administered 2017-10-05 – 2017-10-06 (×2): 40 mg via SUBCUTANEOUS
  Filled 2017-10-05 (×2): qty 0.4

## 2017-10-05 MED ORDER — METRONIDAZOLE IN NACL 5-0.79 MG/ML-% IV SOLN
500.0000 mg | Freq: Three times a day (TID) | INTRAVENOUS | Status: DC
  Administered 2017-10-05 – 2017-10-10 (×14): 500 mg via INTRAVENOUS
  Filled 2017-10-05 (×15): qty 100

## 2017-10-05 MED ORDER — CLINDAMYCIN PHOSPHATE 600 MG/50ML IV SOLN
600.0000 mg | Freq: Once | INTRAVENOUS | Status: AC
  Administered 2017-10-05: 600 mg via INTRAVENOUS
  Filled 2017-10-05: qty 50

## 2017-10-05 MED ORDER — IBUPROFEN 400 MG PO TABS
400.0000 mg | ORAL_TABLET | Freq: Four times a day (QID) | ORAL | Status: DC | PRN
  Administered 2017-10-06 – 2017-10-07 (×4): 400 mg via ORAL
  Filled 2017-10-05 (×5): qty 2

## 2017-10-05 MED ORDER — ALBUTEROL SULFATE (2.5 MG/3ML) 0.083% IN NEBU: 2.5000 mg | INHALATION_SOLUTION | RESPIRATORY_TRACT | Status: DC | PRN

## 2017-10-05 MED ORDER — ZOLPIDEM TARTRATE 5 MG PO TABS
5.0000 mg | ORAL_TABLET | Freq: Every evening | ORAL | Status: DC | PRN
  Administered 2017-10-06: 5 mg via ORAL
  Filled 2017-10-05: qty 1

## 2017-10-05 MED ORDER — VANCOMYCIN HCL IN DEXTROSE 1-5 GM/200ML-% IV SOLN
1000.0000 mg | Freq: Once | INTRAVENOUS | Status: AC
  Administered 2017-10-05: 1000 mg via INTRAVENOUS
  Filled 2017-10-05 (×2): qty 200

## 2017-10-05 MED ORDER — NICOTINE 21 MG/24HR TD PT24
21.0000 mg | MEDICATED_PATCH | Freq: Once | TRANSDERMAL | Status: DC
  Administered 2017-10-05: 21 mg via TRANSDERMAL
  Filled 2017-10-05: qty 1

## 2017-10-05 MED ORDER — CITALOPRAM HYDROBROMIDE 20 MG PO TABS
10.0000 mg | ORAL_TABLET | Freq: Every day | ORAL | Status: DC
  Administered 2017-10-06 – 2017-10-10 (×5): 10 mg via ORAL
  Filled 2017-10-05 (×5): qty 1

## 2017-10-05 MED ORDER — NITROGLYCERIN 0.4 MG SL SUBL: 0.4000 mg | SUBLINGUAL_TABLET | SUBLINGUAL | Status: DC | PRN

## 2017-10-05 MED ORDER — HYDROCODONE-ACETAMINOPHEN 5-325 MG PO TABS
1.0000 | ORAL_TABLET | Freq: Once | ORAL | Status: AC
  Administered 2017-10-05: 1 via ORAL
  Filled 2017-10-05: qty 1

## 2017-10-05 MED ORDER — PANTOPRAZOLE SODIUM 40 MG PO TBEC
40.0000 mg | DELAYED_RELEASE_TABLET | Freq: Every day | ORAL | Status: DC
  Administered 2017-10-06 – 2017-10-10 (×5): 40 mg via ORAL
  Filled 2017-10-05 (×5): qty 1

## 2017-10-05 MED ORDER — NICOTINE 21 MG/24HR TD PT24
21.0000 mg | MEDICATED_PATCH | Freq: Every day | TRANSDERMAL | Status: DC
  Administered 2017-10-06 – 2017-10-09 (×4): 21 mg via TRANSDERMAL
  Filled 2017-10-05 (×5): qty 1

## 2017-10-05 MED ORDER — ONDANSETRON HCL 4 MG PO TABS: 4.0000 mg | ORAL_TABLET | Freq: Four times a day (QID) | ORAL | Status: DC | PRN

## 2017-10-05 MED ORDER — HYDRALAZINE HCL 20 MG/ML IJ SOLN: 5.0000 mg | INTRAMUSCULAR | Status: DC | PRN

## 2017-10-05 MED ORDER — VANCOMYCIN HCL IN DEXTROSE 750-5 MG/150ML-% IV SOLN
750.0000 mg | INTRAVENOUS | Status: DC
  Filled 2017-10-05: qty 150

## 2017-10-05 MED ORDER — GABAPENTIN 300 MG PO CAPS
300.0000 mg | ORAL_CAPSULE | Freq: Two times a day (BID) | ORAL | Status: DC
  Administered 2017-10-05 – 2017-10-10 (×9): 300 mg via ORAL
  Filled 2017-10-05 (×10): qty 1

## 2017-10-05 MED ORDER — ENSURE ENLIVE PO LIQD
237.0000 mL | Freq: Two times a day (BID) | ORAL | Status: DC
  Administered 2017-10-06 – 2017-10-10 (×7): 237 mL via ORAL

## 2017-10-05 MED ORDER — MOMETASONE FURO-FORMOTEROL FUM 200-5 MCG/ACT IN AERO
2.0000 | INHALATION_SPRAY | Freq: Two times a day (BID) | RESPIRATORY_TRACT | Status: DC
  Administered 2017-10-06 – 2017-10-10 (×6): 2 via RESPIRATORY_TRACT
  Filled 2017-10-05: qty 8.8

## 2017-10-05 NOTE — Progress Notes (Signed)
Pharmacy Antibiotic Note  Brittany [name] is a 62 y.o. female admitted on 10/05/2017 with facial cellulitis. (PCN allergy, no record of cephs being tolerated; developed diarrhea on clindamycin.)  Pharmacy has been consulted for vancomycin dosing.  Plan:  Vancomycin 1000 mg IV now, then 750 mg IV q24 hr (est AUC 4444 based on SCr rounded to 0.8)  Measure vancomycin AUC at steady state as indicated   Height: 5\' 2"  (157.5 cm) Weight: 108 lb (49 kg) IBW/kg (Calculated) : 50.1  Temp (24hrs), Avg:99 F (37.2 C), Min:99 F (37.2 C), Max:99 F (37.2 C)  Recent Labs  Lab 10/05/17 1615 10/05/17 1626  WBC 11.9*  --   CREATININE 0.62  --   LATICACIDVEN  --  1.68    Estimated Creatinine Clearance: 58.6 mL/min (by C-G formula based on SCr of 0.62 mg/dL).    Allergies  Allergen Reactions  . Amoxicillin Hives     Has patient had a PCN reaction causing immediate rash, facial/tongue/throat swelling, SOB or lightheadedness with hypotension: Yes hives Has patient had a PCN reaction causing severe rash involving mucus membranes or skin necrosis: No Has patient had a PCN reaction that required hospitalization No Has patient had a PCN reaction occurring within the last 10 years: Yes If all of the above answers are "NO", then may proceed with Cephalosporin use.   . Clindamycin/Lincomycin     Patient states that clindamycin causes diarrhea to her.  . Sulfonamide Derivatives Nausea And Vomiting  . Wellbutrin [Bupropion] Other (See Comments)    Lightheaded Diarrhea      Thank you for allowing pharmacy to be a part of this patient's care.  Bernadene Personrew Carlyne Keehan, PharmD, BCPS [phone] 10/05/2017, 7:20 PM
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
## 2017-10-05 NOTE — Progress Notes (Signed)
While doing nursing admission history, pt states she has filled out a dnr and organ donor form. Pt states she does not have a dnr form with her.Pt's rn Job FoundsKaitlin Zuleta RN informed. I also instructed pt to tell the admitting md that she wants to be a dnr if this is what she wishes to do. Pt verbalizes understanding.  Briscoe Burns. Carleane Ella Golomb BSN, RN-BC Admissions RN 10/05/2017 6:32 PM
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
## 2017-10-06 LAB — BASIC METABOLIC PANEL
ANION GAP: 9 (ref 5–15)
BUN: 16 mg/dL (ref 6–20)
CHLORIDE: 107 mmol/L (ref 98–111)
CO2: 20 mmol/L — AB (ref 22–32)
Calcium: 7.8 mg/dL — ABNORMAL LOW (ref 8.9–10.3)
Creatinine, Ser: 0.56 mg/dL (ref 0.44–1.00)
GFR calc Af Amer: >60 mL/min (ref >60)
GLUCOSE: 105 mg/dL — AB (ref 70–99)
POTASSIUM: 3.4 mmol/L — AB (ref 3.5–5.1)
Sodium: 136 mmol/L (ref 135–145)

## 2017-10-06 LAB — CBC
HEMATOCRIT: 33.2 % — AB (ref 36.0–46.0)
HEMOGLOBIN: 11.3 g/dL — AB (ref 12.0–15.0)
MCH: 30.1 pg (ref 26.0–34.0)
MCHC: 34 g/dL (ref 30.0–36.0)
MCV: 88.5 fL (ref 78.0–100.0)
Platelets: 337 10*3/uL (ref 150–400)
RBC: 3.75 MIL/uL — ABNORMAL LOW (ref 3.87–5.11)
RDW: 13.4 % (ref 11.5–15.5)
WBC: 10.8 10*3/uL — ABNORMAL HIGH (ref 4.0–10.5)

## 2017-10-06 MED ORDER — MORPHINE SULFATE (PF) 2 MG/ML IV SOLN: 2.0000 mg | Freq: Once | INTRAVENOUS | Status: DC

## 2017-10-06 MED ORDER — SODIUM CHLORIDE 0.9 % IV SOLN
INTRAVENOUS | Status: DC
  Administered 2017-10-06: 1000 mL via INTRAVENOUS
  Administered 2017-10-06: 13:00:00 via INTRAVENOUS
  Administered 2017-10-06 – 2017-10-07 (×2): 125 mL/h via INTRAVENOUS
  Administered 2017-10-07 – 2017-10-10 (×8): via INTRAVENOUS

## 2017-10-06 MED ORDER — SODIUM CHLORIDE 0.9 % IV BOLUS
1000.0000 mL | Freq: Once | INTRAVENOUS | Status: AC
  Administered 2017-10-06: 1000 mL via INTRAVENOUS
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
## 2017-10-07 ENCOUNTER — Encounter (HOSPITAL_COMMUNITY): Payer: Self-pay | Admitting: Certified Registered"

## 2017-10-07 ENCOUNTER — Inpatient Hospital Stay (HOSPITAL_COMMUNITY): Payer: Medicare HMO

## 2017-10-07 ENCOUNTER — Encounter (HOSPITAL_COMMUNITY)
Admission: EM | Disposition: A | Discharge status: Home / Self Care | Payer: Self-pay | Source: Home / Self Care | Attending: Internal Medicine

## 2017-10-07 ENCOUNTER — Inpatient Hospital Stay (HOSPITAL_COMMUNITY): Payer: Medicare HMO | Admitting: Certified Registered Nurse Anesthetist

## 2017-10-07 DIAGNOSIS — M6008 Infective myositis, other site: Secondary | ICD-10-CM

## 2017-10-07 DIAGNOSIS — I10 Essential (primary) hypertension: Secondary | ICD-10-CM

## 2017-10-07 HISTORY — PX: MINOR EXCISION OF ORAL LESION: SHX6466

## 2017-10-07 LAB — SURGICAL PCR SCREEN
MRSA, PCR: NEGATIVE
Staphylococcus aureus: NEGATIVE

## 2017-10-07 SURGERY — MINOR EXCISION OF ORAL LESION
Anesthesia: General

## 2017-10-07 MED ORDER — FENTANYL CITRATE (PF) 100 MCG/2ML IJ SOLN
INTRAMUSCULAR | Status: AC
  Filled 2017-10-07: qty 2

## 2017-10-07 MED ORDER — ROCURONIUM BROMIDE 10 MG/ML (PF) SYRINGE
PREFILLED_SYRINGE | INTRAVENOUS | Status: AC
  Filled 2017-10-07: qty 10

## 2017-10-07 MED ORDER — SUGAMMADEX SODIUM 200 MG/2ML IV SOLN
INTRAVENOUS | Status: DC | PRN
  Administered 2017-10-07: 100 mg via INTRAVENOUS

## 2017-10-07 MED ORDER — SUCCINYLCHOLINE CHLORIDE 200 MG/10ML IV SOSY
PREFILLED_SYRINGE | INTRAVENOUS | Status: AC
  Filled 2017-10-07: qty 10

## 2017-10-07 MED ORDER — METOCLOPRAMIDE HCL 5 MG/ML IJ SOLN: 10.0000 mg | Freq: Once | INTRAMUSCULAR | Status: DC | PRN

## 2017-10-07 MED ORDER — SODIUM CHLORIDE 0.9 % IR SOLN
Status: DC | PRN
  Administered 2017-10-07: 1000 mL

## 2017-10-07 MED ORDER — ROCURONIUM BROMIDE 10 MG/ML (PF) SYRINGE
PREFILLED_SYRINGE | INTRAVENOUS | Status: DC | PRN
  Administered 2017-10-07: 30 mg via INTRAVENOUS

## 2017-10-07 MED ORDER — LIDOCAINE 2% (20 MG/ML) 5 ML SYRINGE
INTRAMUSCULAR | Status: AC
  Filled 2017-10-07: qty 5

## 2017-10-07 MED ORDER — SODIUM CHLORIDE 0.9 % IV SOLN
1.0000 g | INTRAVENOUS | Status: DC
  Administered 2017-10-07 – 2017-10-09 (×3): 1 g via INTRAVENOUS
  Filled 2017-10-07 (×2): qty 10
  Filled 2017-10-07 (×2): qty 1

## 2017-10-07 MED ORDER — LIDOCAINE-EPINEPHRINE 1 %-1:100000 IJ SOLN
INTRAMUSCULAR | Status: AC
  Filled 2017-10-07: qty 1

## 2017-10-07 MED ORDER — DEXAMETHASONE SODIUM PHOSPHATE 10 MG/ML IJ SOLN
INTRAMUSCULAR | Status: DC | PRN
  Administered 2017-10-07: 5 mg via INTRAVENOUS

## 2017-10-07 MED ORDER — IOPAMIDOL (ISOVUE-300) INJECTION 61%
INTRAVENOUS | Status: AC
  Filled 2017-10-07: qty 100

## 2017-10-07 MED ORDER — OXYMETAZOLINE HCL 0.05 % NA SOLN
NASAL | Status: DC | PRN
  Administered 2017-10-07: 2 via NASAL

## 2017-10-07 MED ORDER — BUPIVACAINE-EPINEPHRINE (PF) 0.25% -1:200000 IJ SOLN
INTRAMUSCULAR | Status: AC
  Filled 2017-10-07: qty 30

## 2017-10-07 MED ORDER — BUPIVACAINE-EPINEPHRINE 0.25% -1:200000 IJ SOLN
INTRAMUSCULAR | Status: DC | PRN
  Administered 2017-10-07: 30 mL

## 2017-10-07 MED ORDER — PROPOFOL 10 MG/ML IV BOLUS
INTRAVENOUS | Status: AC
  Filled 2017-10-07: qty 20

## 2017-10-07 MED ORDER — FENTANYL CITRATE (PF) 100 MCG/2ML IJ SOLN
25.0000 ug | INTRAMUSCULAR | Status: DC | PRN
  Administered 2017-10-07 (×3): 50 ug via INTRAVENOUS

## 2017-10-07 MED ORDER — PROPOFOL 10 MG/ML IV BOLUS
INTRAVENOUS | Status: DC | PRN
  Administered 2017-10-07: 180 mg via INTRAVENOUS

## 2017-10-07 MED ORDER — LIDOCAINE 2% (20 MG/ML) 5 ML SYRINGE
INTRAMUSCULAR | Status: DC | PRN
  Administered 2017-10-07: 50 mg via INTRAVENOUS

## 2017-10-07 MED ORDER — IBUPROFEN 200 MG PO TABS
600.0000 mg | ORAL_TABLET | Freq: Four times a day (QID) | ORAL | Status: DC
  Administered 2017-10-08 – 2017-10-10 (×9): 600 mg via ORAL
  Filled 2017-10-07 (×2): qty 1
  Filled 2017-10-07 (×8): qty 3

## 2017-10-07 MED ORDER — SUCCINYLCHOLINE CHLORIDE 200 MG/10ML IV SOSY
PREFILLED_SYRINGE | INTRAVENOUS | Status: DC | PRN
  Administered 2017-10-07: 120 mg via INTRAVENOUS

## 2017-10-07 MED ORDER — PHENYLEPHRINE 40 MCG/ML (10ML) SYRINGE FOR IV PUSH (FOR BLOOD PRESSURE SUPPORT)
PREFILLED_SYRINGE | INTRAVENOUS | Status: DC | PRN
  Administered 2017-10-07: 80 ug via INTRAVENOUS

## 2017-10-07 MED ORDER — CHLORHEXIDINE GLUCONATE 0.12 % MT SOLN
15.0000 mL | Freq: Three times a day (TID) | OROMUCOSAL | Status: DC
  Administered 2017-10-08 – 2017-10-10 (×7): 15 mL via OROMUCOSAL
  Filled 2017-10-07 (×8): qty 15

## 2017-10-07 MED ORDER — FENTANYL CITRATE (PF) 100 MCG/2ML IJ SOLN
INTRAMUSCULAR | Status: DC | PRN
  Administered 2017-10-07: 50 ug via INTRAVENOUS
  Administered 2017-10-07: 25 ug via INTRAVENOUS
  Administered 2017-10-07: 50 ug via INTRAVENOUS
  Administered 2017-10-07 (×2): 25 ug via INTRAVENOUS

## 2017-10-07 MED ORDER — MEPERIDINE HCL 50 MG/ML IJ SOLN: 6.2500 mg | INTRAMUSCULAR | Status: DC | PRN

## 2017-10-07 MED ORDER — LIDOCAINE-EPINEPHRINE 1 %-1:100000 IJ SOLN
INTRAMUSCULAR | Status: DC | PRN
  Administered 2017-10-07: 20 mL

## 2017-10-07 MED ORDER — IOPAMIDOL (ISOVUE-300) INJECTION 61%
75.0000 mL | Freq: Once | INTRAVENOUS | Status: AC | PRN
  Administered 2017-10-07: 75 mL via INTRAVENOUS

## 2017-10-07 MED ORDER — LACTATED RINGERS IV SOLN
INTRAVENOUS | Status: DC
  Administered 2017-10-07: 19:00:00 via INTRAVENOUS

## 2017-10-07 MED ORDER — ONDANSETRON HCL 4 MG/2ML IJ SOLN
INTRAMUSCULAR | Status: DC | PRN
  Administered 2017-10-07: 4 mg via INTRAVENOUS

## 2017-10-07 MED ORDER — MORPHINE SULFATE (PF) 4 MG/ML IV SOLN
1.0000 mg | INTRAVENOUS | Status: DC | PRN
  Administered 2017-10-07 – 2017-10-08 (×2): 2 mg via INTRAVENOUS
  Filled 2017-10-07 (×2): qty 1

## 2017-10-07 SURGICAL SUPPLY — 23 items
BLADE SURG 15 STRL LF DISP TIS (BLADE) IMPLANT
BLADE SURG 15 STRL SS (BLADE) ×2
BNDG GAUZE ELAST 4 BULKY (GAUZE/BANDAGES/DRESSINGS) ×1 IMPLANT
BUR SIDE CUT (BURR) ×1 IMPLANT
ELECT REM PT RETURN 9FT ADLT (ELECTROSURGICAL) ×2
ELECTRODE REM PT RTRN 9FT ADLT (ELECTROSURGICAL) IMPLANT
GAUZE SPONGE 4X4 12PLY STRL (GAUZE/BANDAGES/DRESSINGS) ×1 IMPLANT
GAUZE SPONGE 4X4 16PLY XRAY LF (GAUZE/BANDAGES/DRESSINGS) ×1 IMPLANT
KIT BASIN OR (CUSTOM PROCEDURE TRAY) ×1 IMPLANT
NDL BLUNT 17GA (NEEDLE) IMPLANT
NDL HYPO 25X1 1.5 SAFETY (NEEDLE) IMPLANT
NEEDLE BLUNT 17GA (NEEDLE) ×2 IMPLANT
NEEDLE HYPO 25X1 1.5 SAFETY (NEEDLE) ×4 IMPLANT
PACK EENT SPLIT (PACKS) ×1 IMPLANT
SUT CHROMIC 3 0 SH 27 (SUTURE) ×2 IMPLANT
SUT SILK 2 0 (SUTURE) ×2
SUT SILK 2 0 30  PSL (SUTURE) ×1
SUT SILK 2 0 30 PSL (SUTURE) IMPLANT
SUT SILK 2-0 18XBRD TIE 12 (SUTURE) IMPLANT
SWAB COLLECTION DEVICE MRSA (MISCELLANEOUS) ×1 IMPLANT
SWAB CULTURE ESWAB REG 1ML (MISCELLANEOUS) ×1 IMPLANT
SYR CONTROL 10ML LL (SYRINGE) ×1 IMPLANT
TOWEL OR 17X26 10 PK STRL BLUE (TOWEL DISPOSABLE) ×1 IMPLANT

## 2017-10-07 NOTE — Progress Notes (Signed)
Pt to CT

## 2017-10-07 NOTE — Anesthesia Procedure Notes (Signed)
Procedure Name: Intubation Date/Time: 10/07/2017 6:46 PM Performed by: West Pugh, CRNA Pre-anesthesia Checklist: Patient identified, Emergency Drugs available, Suction available, Patient being monitored and Timeout performed Patient Re-evaluated:Patient Re-evaluated prior to induction Oxygen Delivery Method: Circle system utilized Preoxygenation: Pre-oxygenation with 100% oxygen Induction Type: IV induction and Cricoid Pressure applied Ventilation: Two handed mask ventilation required and Oral airway inserted - appropriate to patient size Laryngoscope Size: Mac and 4 Nasal Tubes: Nasal prep performed, Right and Magill forceps - small, utilized Tube size: 6.5 mm Number of attempts: 2 Airway Equipment and Method: Stylet Placement Confirmation: ETT inserted through vocal cords under direct vision,  positive ETCO2,  CO2 detector and breath sounds checked- equal and bilateral Tube secured with: Tape Dental Injury: Teeth and Oropharynx as per pre-operative assessment  Comments: Nasally intubated x 2 attempts. CRNA x 1 attempt unsuccessful, MD attempt x 1 successful.

## 2017-10-07 NOTE — Progress Notes (Signed)
Pt eating lunch at this time

## 2017-10-07 NOTE — Progress Notes (Signed)
[facility], however, pt. Is in the OR at this time.  Med[facility].

## 2017-10-07 NOTE — Progress Notes (Addendum)
PROGRESS NOTE  Brittany [name] WUJ:[id number] DOB: 06/08/1955 DOA: 10/05/2017 PCP: Kirt Boysarter, Monica, DO  HPI/Recap of past 24 hours: Brittany Solianerry Clantonis a 62 y.o.femalewith medical history significant ofhypertension, COPDon 2-2.5 L of home O2 prn, stroke, GERD, depression, Alzheimer's disease, viral hepatitis, tobacco abuse, who presents with pain and swelling in left face and jaw.  Per patient her symptoms started with trauma to the face, she has been on clindamycin since 09/13/2017 subsequently developed diarrhea.  Worsening left lower face edema erythema subjective fever and chills 2 days ago saw ENT Dr.Teoh 2 days prior not see any abscess was concerned about her jaw subsequently referred the patient for CT.   CT scan of maxillary facial revealed left face cellulitis, myositis, likely odontogenic, assessment for abscess, poor dentition multiple mandibular dental caries.  Periapical abscess and incidentally found perforated nasal septum.   10/07/2017: Patient seen and examined at her bedside.  She reports worsening left mandibular swelling and discomfort upon swallowing.  Oral surgery Dr. Kenney Housemanrab made aware, recommended repeating CT maxillofacial with contrast.  Incidental finding of perforated nasal septum, patient is asymptomatic.  UPDATE: Large periapical abscess reported by Dr Kenney Housemanrab noted on CT maxillofacial with contrast. NPO now. Will be taken to the OR later this afternoon. Anesthesiology has been made aware.    Assessment/Plan: Principal Problem:   Facial cellulitis-left Active Problems:   TOBACCO ABUSE   Benign essential HTN   COPD (chronic obstructive pulmonary disease) (HCC)   GERD (gastroesophageal reflux disease)   Depression   Diarrhea  Left facial cellulitis/myositis/periapical abscess Worsening left mandibular swelling Dr. Kenney Housemanrab made aware and recommended repeating CT maxillofacial with contrast Allergies to amoxicillin Reports clindamycin gives her diarrhea Currently on  IV Flagyl and IV Rocephin, continue Continue pain management and IV fluids Hold off bowel regimen to avoid recurrence of diarrhea Monitor fever curve and WBC Repeat CBC in the morning  Perforated nasal septum, incidentally found Patient is asymptomatic Unclear etiology Follow-up with Dr. Jearld FentonByers ENT outpatient  COPD, stable Continue COPD medications  Chronic depression, stable Continue Celexa  Diarrhea, resolved Suspect induced by clindamycin, hold If recurrence obtain C. Difficile pcr  Tobacco abuse Continue nicotine patch    Code Status: Full code  Family Communication: None at bedside  Disposition Plan: Home in 1 to 2 days when clinically stable and oral surgery signs off.   Consultants: Oral surgery Dr. Kenney Housemanrab  Procedures:  None  Antimicrobials:  IV Flagyl  IV Rocephin  DVT prophylaxis: Subcu Lovenox   Objective: Vitals:   10/06/17 1346 10/06/17 2025 10/06/17 2135 10/07/17 0458  BP: 129/78  118/65 133/71  Pulse: 99  96 (!) 101  Resp: 17  16 16   Temp: 98.5 F (36.9 C)  100.2 F (37.9 C) 100 F (37.8 C)  TempSrc: Oral  Oral Oral  SpO2: 100% 98% 100% 98%  Weight:      Height:        Intake/Output Summary (Last 24 hours) at 10/07/2017 1126 Last data filed at 10/07/2017 0700 Gross per 24 hour  Intake 2216.67 ml  Output 1200 ml  Net 1016.67 ml   Filed Weights   10/05/17 1227  Weight: 49 kg (108 lb)    Exam:  . General: 62 y.o. year-old female well developed well nourished in no acute distress.  Alert and oriented x3.  Moderate swelling of left mandible. . Cardiovascular: Regular rate and rhythm with no rubs or gallops.  No thyromegaly or JVD noted.   Marland Kitchen. Respiratory: Clear to auscultation with  no wheezes or rales. Good inspiratory effort. . Abdomen: Soft nontender nondistended with normal bowel sounds x4 quadrants. . Musculoskeletal: No lower extremity edema. 2/4 pulses in all 4 extremities. Marland Kitchen Psychiatry: Mood is appropriate for condition and  setting   Data Reviewed: CBC: Recent Labs  Lab 10/05/17 1615 10/06/17 0502  WBC 11.9* 10.8*  NEUTROABS 9.5*  --   HGB 14.2 11.3*  HCT 40.7 33.2*  MCV 88.7 88.5  PLT 388 337   Basic Metabolic Panel: Recent Labs  Lab 10/05/17 1615 10/06/17 0502  NA 136 136  K 4.2 3.4*  CL 99 107  CO2 24 20*  GLUCOSE 104* 105*  BUN 20 16  CREATININE 0.62 0.56  CALCIUM 9.5 7.8*   GFR: Estimated Creatinine Clearance: 58.6 mL/min (by C-G formula based on SCr of 0.56 mg/dL). Liver Function Tests: Recent Labs  Lab 10/05/17 1615  AST 17  ALT 13  ALKPHOS 83  BILITOT 0.7  PROT 8.6*  ALBUMIN 4.4   No results for input(s): LIPASE, AMYLASE in the last 168 hours. No results for input(s): AMMONIA in the last 168 hours. Coagulation Profile: No results for input(s): INR, PROTIME in the last 168 hours. Cardiac Enzymes: No results for input(s): CKTOTAL, CKMB, CKMBINDEX, TROPONINI in the last 168 hours. BNP (last 3 results) No results for input(s): PROBNP in the last 8760 hours. HbA1C: No results for input(s): HGBA1C in the last 72 hours. CBG: No results for input(s): GLUCAP in the last 168 hours. Lipid Profile: No results for input(s): CHOL, HDL, LDLCALC, TRIG, CHOLHDL, LDLDIRECT in the last 72 hours. Thyroid Function Tests: No results for input(s): TSH, T4TOTAL, FREET4, T3FREE, THYROIDAB in the last 72 hours. Anemia Panel: No results for input(s): VITAMINB12, FOLATE, FERRITIN, TIBC, IRON, RETICCTPCT in the last 72 hours. Urine analysis:    Component Value Date/Time   COLORURINE YELLOW 07/11/2013 1933   APPEARANCEUR CLEAR 07/11/2013 1933   LABSPEC 1.007 07/11/2013 1933   PHURINE 8.0 07/11/2013 1933   GLUCOSEU NEGATIVE 07/11/2013 1933   HGBUR NEGATIVE 07/11/2013 1933   BILIRUBINUR NEGATIVE 07/11/2013 1933   KETONESUR NEGATIVE 07/11/2013 1933   PROTEINUR NEGATIVE 07/11/2013 1933   UROBILINOGEN 0.2 07/11/2013 1933   NITRITE NEGATIVE 07/11/2013 1933   LEUKOCYTESUR NEGATIVE  07/11/2013 1933   Sepsis Labs: @LABRCNTIP (procalcitonin:4,lacticidven:4)  ) Recent Results (from the past 240 hour(s))  Blood culture (routine x 2)     Status: None (Preliminary result)   Collection Time: 10/05/17  3:31 PM  Result Value Ref Range Status   Specimen Description   Final    BLOOD RIGHT ANTECUBITAL Performed at [facility], [address] [place], [place] [address]    Special Requests   Final    BOTTLES DRAWN AEROBIC AND ANAEROBIC Blood Culture adequate volume Performed at [facility], [address], [place], [place] [address]    Culture   Final    NO GROWTH < 24 HOURS Performed at [facility], [address], [place], [place] [address]    Report Status PENDING  Incomplete  Blood culture (routine x 2)     Status: None (Preliminary result)   Collection Time: 10/05/17  4:15 PM  Result Value Ref Range Status   Specimen Description   Final    BLOOD LEFT WRIST Performed at [facility], [address], [place], [place] [address]    Special Requests   Final    BOTTLES DRAWN AEROBIC AND ANAEROBIC Blood Culture results may not be  optimal due to an inadequate volume of blood received in culture bottles Performed at [facility], [address] [place], [place] [address]    Culture   Final    NO GROWTH < 24 HOURS Performed at [facility], [address] [place], [place] [address]    Report Status PENDING  Incomplete      Studies: No results found.  Scheduled Meds: . citalopram  10 mg Oral Daily  . enoxaparin (LOVENOX) injection  40 mg Subcutaneous Q24H  . feeding supplement (ENSURE ENLIVE)  237 mL Oral BID BM  . gabapentin  300 mg Oral BID  . losartan  50 mg Oral Daily  . mometasone-formoterol  2 puff Inhalation BID  .  morphine injection  2 mg Intravenous Once  . nicotine  21 mg Transdermal Daily  . pantoprazole  40 mg Oral Daily    Continuous Infusions: . sodium chloride      . sodium chloride 125 mL/hr at 10/07/17 1054  . cefTRIAXone (ROCEPHIN)  IV Stopped (10/07/17 1054)  . metronidazole Stopped (10/07/17 0740)     LOS: 2 days     Darlin Drop, MD [facility] Pager [phone]  If 7PM-7AM, please contact night-coverage [email or web address] Password [facility] 10/07/2017, 11:26 AM
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
## 2017-10-07 NOTE — Anesthesia Preprocedure Evaluation (Signed)
Anesthesia Evaluation  Patient identified by MRN, date of birth, ID band Patient awake    Reviewed: Allergy & Precautions, NPO status , Patient's Chart, lab work & pertinent test results  Airway Mallampati: III  TM Distance: >3 FB Neck ROM: Full  Mouth opening: Limited Mouth Opening  Dental no notable dental hx. (+) Poor Dentition, Loose, Missing   Pulmonary COPD, Current Smoker,     + decreased breath sounds      Cardiovascular hypertension, negative cardio ROS Normal cardiovascular exam Rhythm:Regular Rate:Normal     Neuro/Psych Dementia CVA    GI/Hepatic negative GI ROS, Neg liver ROS,   Endo/Other  negative endocrine ROS  Renal/GU negative Renal ROS  negative genitourinary   Musculoskeletal negative musculoskeletal ROS (+)   Abdominal   Peds negative pediatric ROS (+)  Hematology negative hematology ROS (+)   Anesthesia Other Findings   Reproductive/Obstetrics negative OB ROS                             Anesthesia Physical Anesthesia Plan  ASA: III and emergent  Anesthesia Plan: General   Post-op Pain Management:    Induction: Intravenous  PONV Risk Score and Plan: 2 and Ondansetron and Treatment may vary due to age or medical condition  Airway Management Planned: Nasal ETT  Additional Equipment:   Intra-op Plan:   Post-operative Plan: Extubation in OR  Informed Consent: I have reviewed the patients History and Physical, chart, labs and discussed the procedure including the risks, benefits and alternatives for the proposed anesthesia with the patient or authorized representative who has indicated his/her understanding and acceptance.   Dental advisory given  Plan Discussed with: CRNA  Anesthesia Plan Comments:         Anesthesia Quick Evaluation

## 2017-10-07 NOTE — Brief Op Note (Signed)
10/07/2017  7:47 PM  PATIENT:  Brittany [name]  62 y.o. female  PRE-OPERATIVE DIAGNOSIS: Left submandibular/masticatory space odontogenic abscess  POST-OPERATIVE DIAGNOSIS:  Left submandibular/masticatory space odontogenic abscess  PROCEDURE: 1.  Incision and drainage of left submandibular/masticatory spaces (intraoral/extraoral approaches) 2.  Surgical removal of teeth #2, 18, 20, 22, 27, 28, 29, 31 3.  Alveoplasty lower right quadrant.  SURGEON:  Surgeon(s) and Role:    * Skiler Olden, OptometristJustin, DMD - Primary  ASSISTANTS: Hadassah Paisae Carter, Surgical assistant   ANESTHESIA:   general  EBL:  10cc  BLOOD ADMINISTERED:none  DRAINS: Penrose drain in the left cervical area x2  LOCAL MEDICATIONS USED:  LIDOCAINE   CULTURES: Left submandibular/masticatory space abscess  SPECIMEN:  No Specimen  DISPOSITION OF SPECIMEN:  N/A  COUNTS:  YES  TOURNIQUET:  * No tourniquets in log *  DICTATION: .Dragon Dictation  PLAN OF CARE: Admit to inpatient   PATIENT DISPOSITION:  PACU - hemodynamically stable.   Delay start of Pharmacological VTE agent (>24hrs) due to surgical blood loss or risk of bleeding: yes

## 2017-10-07 NOTE — Op Note (Signed)
PATIENT:  Brittany [name]  62 y.o. female  PRE-OPERATIVE DIAGNOSIS: Left submandibular/masticatory space odontogenic abscess; Nonrestorable dentition  POST-OPERATIVE DIAGNOSIS:  Left submandibular/masticatory space odontogenic abscess; Nonrestorable dentition  PROCEDURE: 1.  Incision and drainage of left submandibular/masticatory spaces (intraoral/extraoral approaches) 2.  Surgical removal of teeth #2, 18, 20, 22, 27, 28, 29, 31 3.  Alveoplasty lower right quadrant.  SURGEON:  Surgeon(s) and Role:    * Tawonna Esquer, OptometristJustin, DMD - Primary  ASSISTANTS: Hadassah Paisae Carter, Surgical assistant   ANESTHESIA:   general  EBL:  10cc  BLOOD ADMINISTERED:none  DRAINS: Penrose drain in the left cervical area x2  LOCAL MEDICATIONS USED:  LIDOCAINE   CULTURES: Left submandibular/masticatory space abscess  Complications: None  Operative findings: 1.  Approximately 10 cc of greenish-yellow purulence from left submandibular/masticatory space infection.  Indications for procedure: Patient is a 62 year old female with history of left facial swelling, and CT findings consistent with odontogenic source with large 4 cm abscess in the left submandibular/masticatory spaces requiring surgical incision and drainage and removal of all remaining nonrestorable teeth.  Procedure: The patient was identified in preoperative holding by both anesthesia and the maxilla facial teams.  Health history was reviewed.  Consent was verified.  The patient was brought back to the operating room and placed in the table in the supine position.  Standard ASA leads and monitors were placed.  The patient was preoxygenated, induced, and her airway was protected with a nasoendotracheal tube.  The tube was taped and secured by the anesthesia care team.  The patient was then prepped and draped for standard maxilla facial procedure throat pack was placed.  The patient was injected with 10 cc of 1% lidocaine with 1 100,000 epinephrine in the bilateral  mandible.  The left inferior border the mandible was marked out with a marking pen.  A 1 cm proposed incision was made 2 cm inferior to the inferior border.  A 15 blade was used to make an incision through the skin and subcutaneous tissues.  Blunt dissection was carried out with a large Kelly hemostat and advanced to the inferior border.  Dissection was continued into the submandibular space where frank purulence was obtained.  Cultures were obtained in this area.  Dissection was then continued on the lateral aspect of the mandible into the masticatory space where more purulence was obtained and cultures were taken.  The spaces previously mentioned were thoroughly explored.  And the wound was thoroughly irrigated.  Attention was then directed intraorally where a 15 blade was used to make sulcular incisions adjacent to teeth #18, 20, 22.  Full-thickness flaps were elevated.  Buccal bone was removed with a rongeur forcep.  The teeth were luxated and extracted with a dental forcep.  The sites were thoroughly curetted and irrigated.  The tissues were reapproximated with multiple 3-0 chromic gut sutures.  Attention was then directed to the right side where a 15 blade was used to make a sulcular incision adjacent to teeth #27, 28, 29, and 31.  Full-thickness flap was elevated.  The teeth were luxated and extracted with a dental forcep following removal of buccal bone with a rongeur forcep.  Alveoplasty was carried out in the premolar area with a rongeur forcep and smoothed with a bone file.  The sites were thoroughly curetted and irrigated.  The tissue flaps were then reapproximated with multiple 3-0 chromic gut sutures.  Attention was then directed to the right posterior maxilla where a 15 blade was used to expose the retained  root at site #2.  The root was identified and removed with a dental elevator.  The site was thoroughly curetted and irrigated.  At this point the left submandibular space and masticatory  spaces were thoroughly irrigated with copious amounts of saline.  1/4 inch Penrose drain was then advanced into the submandibular space and secured in the left cervical area with a 2-0 silk suture.  A second quarter inch Penrose drain was then advanced into the masticatory space and secured in the left cervical area with a second 2-0 silk suture.  The left cervical wound was then covered with gauze dressings and a Kerlix wrap.  The patient was then returned to the anesthesia care team where she was extubated without event.  She was transported to the postanesthesia care unit room for recovery.  And will be transported back to the floor for continued IV antibiotics, hydration, observation.  *Please see consult note for postop recommendations.  Junita Push, DMD Oral maxillofacial surgeon

## 2017-10-07 NOTE — Transfer of Care (Signed)
Immediate Anesthesia Transfer of Care Note  Patient: Brittany [name]  Procedure(s) Performed: IRRIGATION AND DEBRIDEMENT OF ORAL INFECTION, REMOVAL OF REMAINING 8 TEETH AND PLACEMENT OF PENROSE DRAINS (N/A )  Patient Location: PACU  Anesthesia Type:General  Level of Consciousness: awake, alert , oriented and patient cooperative  Airway & Oxygen Therapy: Patient Spontanous Breathing and Patient connected to face mask oxygen  Post-op Assessment: Report given to RN and Post -op Vital signs reviewed and stable  Post vital signs: Reviewed and stable  Last Vitals:  Vitals Value Taken Time  BP 179/96 10/07/2017  8:00 PM  Temp 37.6 C 10/07/2017  8:00 PM  Pulse 108 10/07/2017  8:07 PM  Resp 20 10/07/2017  8:07 PM  SpO2 97 % 10/07/2017  8:07 PM  Vitals shown include unvalidated device data.  Last Pain:  Vitals:   10/07/17 1200  TempSrc:   PainSc: 8       Patients Stated Pain Goal: 3 (10/07/17 0518)  Complications: No apparent anesthesia complications

## 2017-10-07 NOTE — Consult Note (Signed)
Intermittent consult note: full consult note to follow.  Patient is a 62 year old female who was admitted yesterday with left facial swelling of a likely odontogenic source.  She was admitted by the hospitalist for IV antibiotics, with instructions to follow-up on an outpatient basis with oral maxilla facial surgery.  Overnight the patient swelling got worse.  A new CT with contrast was obtained showing a large 4 cm abscess in the left submandibular muscular tear or a space with multiple abscessed teeth. Discussed the findings with Dr. Margo AyeHall, with plan to take the patient to the operating room for incision and drainage of left submandibular/masticatory spaces and extraction of all remaining mandibular teeth. n.p.o. for now.  Brittany [name], DMD OMS

## 2017-10-08 LAB — BASIC METABOLIC PANEL
ANION GAP: 9 (ref 5–15)
BUN: 9 mg/dL (ref 6–20)
CO2: 23 mmol/L (ref 22–32)
Calcium: 8.1 mg/dL — ABNORMAL LOW (ref 8.9–10.3)
Chloride: 104 mmol/L (ref 98–111)
Creatinine, Ser: 0.61 mg/dL (ref 0.44–1.00)
GFR calc non Af Amer: >60 mL/min (ref >60)
Glucose, Bld: 140 mg/dL — ABNORMAL HIGH (ref 70–99)
POTASSIUM: 3.5 mmol/L (ref 3.5–5.1)
SODIUM: 136 mmol/L (ref 135–145)

## 2017-10-08 LAB — CBC
HCT: 32.9 % — ABNORMAL LOW (ref 36.0–46.0)
Hemoglobin: 11.2 g/dL — ABNORMAL LOW (ref 12.0–15.0)
MCH: 30 pg (ref 26.0–34.0)
MCHC: 34 g/dL (ref 30.0–36.0)
MCV: 88.2 fL (ref 78.0–100.0)
PLATELETS: 370 10*3/uL (ref 150–400)
RBC: 3.73 MIL/uL — AB (ref 3.87–5.11)
RDW: 13.3 % (ref 11.5–15.5)
WBC: 19.4 10*3/uL — AB (ref 4.0–10.5)

## 2017-10-08 NOTE — Progress Notes (Signed)
Subjective: POD1 s/p I&D left submandibular/masticatory space abscess and extraction of all remaining teeth. Pt feeling well per report. Minimal discomfort. Denies fevers/chills. No dyspnea/dysphagia. Tolerating fluids.  Objective: Vital signs in last 24 hours: Temp:  [98.6 F (37 C)-99.7 F (37.6 C)] 98.7 F (37.1 C) (07/21 0605) Pulse Rate:  [87-112] 89 (07/21 0605) Resp:  [14-26] 14 (07/21 0605) BP: (128-179)/(71-96) 128/71 (07/21 0605) SpO2:  [92 %-100 %] 98 % (07/21 0605) Wt Readings from Last 1 Encounters:  10/05/17 49 kg (108 lb)    Intake/Output from previous day: 07/20 0701 - 07/21 0700 In: 3195.5 [I.V.:2895.5; IV Piggyback:300] Out: 3500 [Urine:3500] Intake/Output this shift: No intake/output data recorded.  PE: Gen: awake/alert, NAD HEENT: mod left facial/cervical edema, TTP, penrose drains in left cervical area with purulent/heme drainage. Intraoral wounds appear hemostatic, Uvula midline, oropharynx clear.  Recent Labs    10/06/17 0502 10/08/17 0514  WBC 10.8* 19.4*  HGB 11.3* 11.2*  HCT 33.2* 32.9*  PLT 337 370    Recent Labs    10/06/17 0502 10/08/17 0514  NA 136 136  K 3.4* 3.5  CL 107 104  CO2 20* 23  GLUCOSE 105* 140*  BUN 16 9  CREATININE 0.56 0.61  CALCIUM 7.8* 8.1*    Medications: I have reviewed the patient's current medications.  Assessment/Plan: POD1 s/p I&D left submandibular/masticatory space abscess and extraction of all remaining teeth. Progressing well thus far.  *Leukocytosis as expected following surgery - repeat CBC in AM *Cultures showing GNRs, GPC - continue Rocephin/Flagyl *Drains productive - will remain in place * Pain controlled - continue scheduled Ibuprofen *Cont Peridex rinses TID *May progress to soft/pureed diet at tolerated with protein supplements.  *Please contact Dr. Kenney Housemanrab at [phone] with questions/concerns regarding the patient's surgical care. Thank you.   LOS: 3 days   Vivia EwingJustin Dorotea Hand, DMD Oral &  Maxillofacial Surgery 10/08/2017, 10:26 AM

## 2017-10-08 NOTE — Anesthesia Postprocedure Evaluation (Signed)
Anesthesia Post Note  Patient: Brittany [name]  Procedure(s) Performed: IRRIGATION AND DEBRIDEMENT OF ORAL INFECTION, REMOVAL OF REMAINING 8 TEETH AND PLACEMENT OF PENROSE DRAINS (N/A )     Patient location during evaluation: PACU Anesthesia Type: General Level of consciousness: awake and alert Pain management: pain level controlled Vital Signs Assessment: post-procedure vital signs reviewed and stable Respiratory status: spontaneous breathing, nonlabored ventilation, respiratory function stable and patient connected to nasal cannula oxygen Cardiovascular status: blood pressure returned to baseline and stable Postop Assessment: no apparent nausea or vomiting Anesthetic complications: no    Last Vitals:  Vitals:   10/07/17 2045 10/07/17 2107  BP: (!) 151/84 (!) 148/89  Pulse: 100 96  Resp: 17 19  Temp: 37.4 C 37.3 C  SpO2: 96% 100%    Last Pain:  Vitals:   10/07/17 2327  TempSrc:   PainSc: 3                  Phillips Groutarignan, Ivannia Willhelm

## 2017-10-08 NOTE — Progress Notes (Addendum)
PROGRESS NOTE  Brittany [name] ZOX:[id number] DOB: 08/05/1955 DOA: 10/05/2017 PCP: Kirt Boys, DO  HPI/Recap of past 24 hours: Brittany [name] a 62 y.o.femalewith medical history significant ofhypertension, COPDon 2-2.5 L of home O2 prn, stroke, GERD, depression, Alzheimer's disease, viral hepatitis, tobacco abuse, who presents with pain and swelling in left face and jaw.  Per patient her symptoms started with trauma to the face, she has been on clindamycin since 09/13/2017 subsequently developed diarrhea.  Worsening left lower face edema erythema, subjective fever and chills 2 days ago. Saw ENT Dr.Teoh was concerned about her jaw subsequently referred the patient for CT.   CT scan of maxillary facial revealed left face cellulitis, myositis, likely odontogenic, periapical abscess, poor dentition multiple mandibular dental caries, and incidentally found perforated nasal septum. Admits to prior history of cocaine use in her 79's.  POD#1 status post I&D left submandibular/masticatory space abscess and extraction of all remaining teeth.  10/08/17: Tolerated the procedure well. No new complaints. Hg is stable.   Assessment/Plan: Principal Problem:   Facial cellulitis-left Active Problems:   TOBACCO ABUSE   Benign essential HTN   COPD (chronic obstructive pulmonary disease) (HCC)   GERD (gastroesophageal reflux disease)   Depression   Diarrhea  Left facial cellulitis/myositis/periapical abscess POD#1status post I&D left submandibular/masticatory space abscess and extraction of all remaining teeth. Surgery done by Dr Kenney Houseman. Highly appreciated. C/w IV antibiotics  Culture showing GNRs, GPC Continue IV Flagyl and IV Rocephin  Continue pain management and IV fluids Start bowel regimen to prevent constipation Repeat CBC in the morning  Leukocytosis Suspect reactive post surgery Obtain CBC in the am  Perforated nasal septum, incidentally found Self-reported prior history of cocaine  use in her 38s Patient is asymptomatic Unclear etiology Follow-up with Dr. Jearld Fenton ENT outpatient  COPD, stable Continue COPD medications  Chronic depression, stable Continue Celexa  Diarrhea, resolved Suspect induced by clindamycin, hold If recurrence obtain C. Difficile pcr  Tobacco abuse Continue nicotine patch    Code Status: Full code  Family Communication: None at bedside  Disposition Plan: Home in 1 to 2 days when tolerates a soft/pured diet and when oral surgery signs of  Consultants: Oral surgery Dr. Kenney Houseman  Procedures: Post I&D left submandibular/masticatory space abscess and extraction of all remaining teeth.  Antimicrobials:  IV Flagyl  IV Rocephin  DVT prophylaxis: Subcu Lovenox   Objective: Vitals:   10/07/17 2030 10/07/17 2045 10/07/17 2107 10/08/17 0605  BP: (!) 155/84 (!) 151/84 (!) 148/89 128/71  Pulse: 87 100 96 89  Resp: (!) 25 17 19 14   Temp:  99.3 F (37.4 C) 99.1 F (37.3 C) 98.7 F (37.1 C)  TempSrc:    Oral  SpO2: 92% 96% 100% 98%  Weight:      Height:        Intake/Output Summary (Last 24 hours) at 10/08/2017 1323 Last data filed at 10/08/2017 0600 Gross per 24 hour  Intake 3095.5 ml  Output 2400 ml  Net 695.5 ml   Filed Weights   10/05/17 1227  Weight: 49 kg (108 lb)    Exam:  . General: 62 y.o. year-old female well-developed well-nourished in no acute distress.  Alert and oriented x3.  Moderate left facial swelling. . Cardiovascular: Regular rate and rhythm with no rubs or gallops.  No thyromegaly or JVD noted. Marland Kitchen Respiratory: Clear to auscultation with no wheezes or rales. Good inspiratory effort. . Abdomen: Soft nontender nondistended with normal bowel sounds x4 quadrants. . Musculoskeletal: No lower extremity edema.  2/4 pulses in all 4 extremities. Marland Kitchen. Psychiatry: Mood is appropriate for condition and setting   Data Reviewed: CBC: Recent Labs  Lab 10/05/17 1615 10/06/17 0502 10/08/17 0514  WBC 11.9* 10.8*  19.4*  NEUTROABS 9.5*  --   --   HGB 14.2 11.3* 11.2*  HCT 40.7 33.2* 32.9*  MCV 88.7 88.5 88.2  PLT 388 337 370   Basic Metabolic Panel: Recent Labs  Lab 10/05/17 1615 10/06/17 0502 10/08/17 0514  NA 136 136 136  K 4.2 3.4* 3.5  CL 99 107 104  CO2 24 20* 23  GLUCOSE 104* 105* 140*  BUN 20 16 9   CREATININE 0.62 0.56 0.61  CALCIUM 9.5 7.8* 8.1*   GFR: Estimated Creatinine Clearance: 58.6 mL/min (by C-G formula based on SCr of 0.61 mg/dL). Liver Function Tests: Recent Labs  Lab 10/05/17 1615  AST 17  ALT 13  ALKPHOS 83  BILITOT 0.7  PROT 8.6*  ALBUMIN 4.4   No results for input(s): LIPASE, AMYLASE in the last 168 hours. No results for input(s): AMMONIA in the last 168 hours. Coagulation Profile: No results for input(s): INR, PROTIME in the last 168 hours. Cardiac Enzymes: No results for input(s): CKTOTAL, CKMB, CKMBINDEX, TROPONINI in the last 168 hours. BNP (last 3 results) No results for input(s): PROBNP in the last 8760 hours. HbA1C: No results for input(s): HGBA1C in the last 72 hours. CBG: No results for input(s): GLUCAP in the last 168 hours. Lipid Profile: No results for input(s): CHOL, HDL, LDLCALC, TRIG, CHOLHDL, LDLDIRECT in the last 72 hours. Thyroid Function Tests: No results for input(s): TSH, T4TOTAL, FREET4, T3FREE, THYROIDAB in the last 72 hours. Anemia Panel: No results for input(s): VITAMINB12, FOLATE, FERRITIN, TIBC, IRON, RETICCTPCT in the last 72 hours. Urine analysis:    Component Value Date/Time   COLORURINE YELLOW 07/11/2013 1933   APPEARANCEUR CLEAR 07/11/2013 1933   LABSPEC 1.007 07/11/2013 1933   PHURINE 8.0 07/11/2013 1933   GLUCOSEU NEGATIVE 07/11/2013 1933   HGBUR NEGATIVE 07/11/2013 1933   BILIRUBINUR NEGATIVE 07/11/2013 1933   KETONESUR NEGATIVE 07/11/2013 1933   PROTEINUR NEGATIVE 07/11/2013 1933   UROBILINOGEN 0.2 07/11/2013 1933   NITRITE NEGATIVE 07/11/2013 1933   LEUKOCYTESUR NEGATIVE 07/11/2013 1933   Sepsis  Labs: @LABRCNTIP (procalcitonin:4,lacticidven:4)  ) Recent Results (from the past 240 hour(s))  Blood culture (routine x 2)     Status: None (Preliminary result)   Collection Time: 10/05/17  3:31 PM  Result Value Ref Range Status   Specimen Description   Final    BLOOD RIGHT ANTECUBITAL Performed at [facility], [address] [place], [place] [address]    Special Requests   Final    BOTTLES DRAWN AEROBIC AND ANAEROBIC Blood Culture adequate volume Performed at [facility], [address] [place], [place] [address]    Culture   Final    NO GROWTH 2 DAYS Performed at [facility], [address] [place], [place] [address]    Report Status PENDING  Incomplete  Blood culture (routine x 2)     Status: None (Preliminary result)   Collection Time: 10/05/17  4:15 PM  Result Value Ref Range Status   Specimen Description   Final    BLOOD LEFT WRIST Performed at [facility], [address] [place], [place] [address]    Special Requests   Final    BOTTLES DRAWN AEROBIC AND ANAEROBIC Blood Culture results may not be optimal due to an inadequate  volume of blood received in culture bottles Performed at [facility], [address] [place], [place] [address]    Culture   Final    NO GROWTH 2 DAYS Performed at [facility], [address] [place], [place] [address]    Report Status PENDING  Incomplete  Surgical pcr screen     Status: None   Collection Time: 10/07/17  1:54 PM  Result Value Ref Range Status   MRSA, PCR NEGATIVE NEGATIVE Final   Staphylococcus aureus NEGATIVE NEGATIVE Final    Comment: (NOTE) The Xpert SA Assay (FDA approved for NASAL specimens in patients 50 years of age and older), is one component of a comprehensive surveillance program. It is not intended to diagnose infection nor to guide or monitor treatment. Performed at [facility], [address], [place], [place] [address]   Aerobic/Anaerobic Culture (surgical/deep wound)     Status: None (Preliminary result)   Collection Time: 10/07/17  7:08 PM  Result Value Ref Range Status   Specimen Description   Final    WOUND Performed at [facility], [address], [place], [place] [address]    Special Requests   Final    NONE Performed at [facility], [address], [place], [place] [address]    Gram Stain   Final    MODERATE WBC PRESENT, PREDOMINANTLY PMN MODERATE GRAM NEGATIVE RODS FEW GRAM POSITIVE COCCI RARE GRAM POSITIVE RODS Performed at [facility], [address], [place], [place] [address]    Culture PENDING  Incomplete   Report Status PENDING  Incomplete      Studies: No results found.  Scheduled Meds: . chlorhexidine  15 mL Mouth/Throat TID  . citalopram  10 mg Oral Daily  . feeding supplement (ENSURE ENLIVE)  237 mL Oral BID BM  . gabapentin  300 mg Oral BID  . ibuprofen  600 mg Oral Q6H  . losartan  50 mg Oral Daily  . mometasone-formoterol  2 puff Inhalation BID  .  morphine injection  2 mg Intravenous Once  . nicotine  21 mg Transdermal Daily  . pantoprazole  40 mg Oral Daily    Continuous Infusions: . sodium chloride    . sodium chloride 125 mL/hr at 10/08/17 1320  . cefTRIAXone (ROCEPHIN)  IV Stopped (10/08/17 1031)  . metronidazole 500 mg (10/08/17 0558)     LOS: 3 days     Darlin Drop, MD [facility] Pager [phone]  If 7PM-7AM, please contact night-coverage [email or web address] Password [email or web address] 10/08/2017, 1:23 PM

## 2017-10-09 DIAGNOSIS — L03211 Cellulitis of face: Secondary | ICD-10-CM

## 2017-10-09 LAB — CBC
HCT: 28.9 % — ABNORMAL LOW (ref 36.0–46.0)
Hemoglobin: 9.9 g/dL — ABNORMAL LOW (ref 12.0–15.0)
MCH: 30.1 pg (ref 26.0–34.0)
MCHC: 34.3 g/dL (ref 30.0–36.0)
MCV: 87.8 fL (ref 78.0–100.0)
PLATELETS: 357 10*3/uL (ref 150–400)
RBC: 3.29 MIL/uL — AB (ref 3.87–5.11)
RDW: 13.5 % (ref 11.5–15.5)
WBC: 10.8 10*3/uL — AB (ref 4.0–10.5)

## 2017-10-09 MED ORDER — VANCOMYCIN HCL IN DEXTROSE 1-5 GM/200ML-% IV SOLN
1000.0000 mg | Freq: Once | INTRAVENOUS | Status: AC
  Administered 2017-10-09: 1000 mg via INTRAVENOUS
  Filled 2017-10-09: qty 200

## 2017-10-09 MED ORDER — VANCOMYCIN HCL IN DEXTROSE 750-5 MG/150ML-% IV SOLN
750.0000 mg | INTRAVENOUS | Status: DC
  Filled 2017-10-09: qty 150

## 2017-10-09 MED ORDER — ENOXAPARIN SODIUM 30 MG/0.3ML ~~LOC~~ SOLN
30.0000 mg | SUBCUTANEOUS | Status: DC
  Administered 2017-10-09: 30 mg via SUBCUTANEOUS
  Filled 2017-10-09: qty 0.3

## 2017-10-09 NOTE — Progress Notes (Signed)
Subjective POD2 s/p I&D left submandibular/masticatory space abscess and extraction of all remaining teeth. Pt feeling well per report. Minimal discomfort. Denies fevers/chills. No dyspnea/dysphagia. Tolerating fluids and ensure supplements.    Objective: Vital signs in last 24 hours: Temp:  [98 F (36.7 C)-98.4 F (36.9 C)] 98 F (36.7 C) (07/22 0524) Pulse Rate:  [80-94] 80 (07/22 0524) Resp:  [14-17] 14 (07/22 0524) BP: (127-135)/(67-72) 127/72 (07/22 0524) SpO2:  [96 %-100 %] 96 % (07/22 0736) Wt Readings from Last 1 Encounters:  10/05/17 49 kg (108 lb)    Intake/Output from previous day: 07/21 0701 - 07/22 0700 In: 2540 [P.O.:840; I.V.:1500; IV Piggyback:200] Out: -  Intake/Output this shift: No intake/output data recorded.  PE: Gen: awake/alert, NAD HEENT: mod left facial/cervical edema - improved, TTP, penrose drains in left cervical area with serosanguinous discharge. Intraoral wounds appear hemostatic, Uvula midline, oropharynx clear.   Recent Labs    10/08/17 0514 10/09/17 0416  WBC 19.4* 10.8*  HGB 11.2* 9.9*  HCT 32.9* 28.9*  PLT 370 357    Recent Labs    10/08/17 0514  NA 136  K 3.5  CL 104  CO2 23  GLUCOSE 140*  BUN 9  CREATININE 0.61  CALCIUM 8.1*    Medications: I have reviewed the patient's current medications.  Assessment/Plan: POD2 s/p I&D left submandibular/masticatory space abscess and extraction of all remaining teeth. Progressing well thus far.  *Leukocytosis improved- repeat CBC in AM *Cultures showing GNRs, GPC - continue Rocephin/Flagyl; convert to PO on discharge for 7 days. *Plan to remove drains tomorrow * Pain controlled - continue scheduled Ibuprofen *Cont Peridex rinses TID *soft/pureed diet at tolerated with protein supplements. *Plan for d/c tomorrow following drain removal.  *Please contact Dr. Kenney Housemanrab at [phone] with questions/concerns regarding the patient's surgical care. Thank you.   LOS: 4 days   Vivia EwingJustin  Analyssa Downs, DMD Oral & Maxillofacial Surgery 10/09/2017, 7:51 AM

## 2017-10-09 NOTE — Progress Notes (Signed)
Pharmacy Antibiotic Note  Brittany [name] is a 62 y.o. female admitted on 10/05/2017 with abscess.  Pharmacy has been consulted for vancomycin dosing. Patient already on Rocephin/Flagyl per Md, Vanc d/c'd 7/19 and now to be restarted today 7/22. Abscess culture still pending  Plan: 1) Vancomycin 1g x 1 then 750mg  IV q24 - goal AUC 400-500 2) Follow up final cultures and narrow as appropriate  Height: 5\' 2"  (157.5 cm) Weight: 108 lb (49 kg) IBW/kg (Calculated) : 50.1  Temp (24hrs), Avg:98.2 F (36.8 C), Min:98 F (36.7 C), Max:98.4 F (36.9 C)  Recent Labs  Lab 10/05/17 1615 10/05/17 1626 10/06/17 0502 10/08/17 0514 10/09/17 0416  WBC 11.9*  --  10.8* 19.4* 10.8*  CREATININE 0.62  --  0.56 0.61  --   LATICACIDVEN  --  1.68  --   --   --     Estimated Creatinine Clearance: 58.6 mL/min (by C-G formula based on SCr of 0.61 mg/dL).    Allergies  Allergen Reactions  . Amoxicillin Hives     Has patient had a PCN reaction causing immediate rash, facial/tongue/throat swelling, SOB or lightheadedness with hypotension: Yes hives Has patient had a PCN reaction causing severe rash involving mucus membranes or skin necrosis: No Has patient had a PCN reaction that required hospitalization No Has patient had a PCN reaction occurring within the last 10 years: Yes If all of the above answers are "NO", then may proceed with Cephalosporin use.   . Clindamycin/Lincomycin     Patient states that clindamycin causes diarrhea to her.  . Sulfonamide Derivatives Nausea And Vomiting  . Wellbutrin [Bupropion] Other (See Comments)    Lightheaded Diarrhea    Antimicrobials this admission: 7/18 vanc >> 7/19, 7/22 >>  7/18 metronidazole >>  7/20 Ceftriaxone >>  Microbiology results: 7/18 BCx: ngtd 7/20 Surg PCR: neg/neg 7/20 operative abscess Cx: mod GNR, few GPC, rare GPR   Thank you for allowing pharmacy to be a part of this patient's care.  Berkley HarveyLegge, Milianna Ericsson Marshall 10/09/2017 8:16 AM

## 2017-10-09 NOTE — Care Management Important Message (Signed)
Important Message  Patient Details  Name: Brittany [name] MRN: [id number] Date of Birth: 08-04-1955   Medicare Important Message Given:  Yes    Caren MacadamFuller, Latoia Eyster 10/09/2017, 11:17 AMImportant Message  Patient Details  Name: Brittany [name] MRN: [id number] Date of Birth: 08-04-1955   Medicare Important Message Given:  Yes    Caren MacadamFuller, Alexzandria Massman 10/09/2017, 11:16 AMImportant Message  Patient Details  Name: Brittany [name] MRN: [id number] Date of Birth: 08-04-1955   Medicare Important Message Given:  Yes    Caren MacadamFuller, Tiannah Greenly 10/09/2017, 11:16 AM

## 2017-10-09 NOTE — Progress Notes (Signed)
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  Per patient her symptoms started with trauma to the face, she has been on clindamycin since 09/13/2017 subsequently developed diarrhea.  Worsening left lower face edema erythema, subjective fever and chills 2 days ago. Saw ENT Dr.Teoh was concerned about her jaw subsequently referred the patient for CT.   CT scan of maxillary facial revealed left face cellulitis, myositis, likely odontogenic, periapical abscess, poor dentition multiple mandibular dental caries, and incidentally found perforated nasal septum. Admits to prior history of cocaine use in her 5020's.  POD#1 status post I&D left submandibular/masticatory space abscess and extraction of all remaining teeth.  10/08/17: Tolerated the procedure well. No new complaints. Hg is stable.  She reports is feeling better today, denies any fever or chills, pain is better controlled, tolerating clear liquid diet   Assessment/Plan: Principal Problem:   Facial cellulitis-left Active Problems:   TOBACCO ABUSE   Benign essential HTN   COPD (chronic obstructive pulmonary disease) (HCC)   GERD (gastroesophageal reflux disease)   Depression   Diarrhea  Left facial cellulitis/myositis/periapical abscess  -Surgery consult greatly appreciated, status post I&D left submandibular/masticatory space abscess and extraction of all remaining teeth by Dr. Kenney Housemanrab on 10/07/2017 -Intraoperative culture growing gram-positive cocci, gram-positive rods, gram-negative rods, getting her allergies she is on Rocephin and Flagyl, I will add vancomycin for now pending gram-positive cocci  sensitivity and specificity. -Possibility of management and oral surgery -Ptosis trending down  Perforated nasal septum, incidentally found Self-reported prior history of cocaine use in her 3720s Patient is asymptomatic Unclear etiology Follow-up with Dr. Jearld FentonByers ENT outpatient  COPD, stable Continue COPD medications  Chronic depression, stable Continue Celexa  Diarrhea, resolved Suspect induced by clindamycin, hold If recurrence obtain C. Difficile pcr  Tobacco abuse Continue nicotine patch    Code Status: Full code  Family Communication: None at bedside  Disposition Plan: Home once cultures are known, and cleared by oral surgery.  Consultants: Oral surgery Dr. Kenney Housemanrab  Procedures: Post I&D left submandibular/masticatory space abscess and extraction of all remaining teeth.  Antimicrobials:  IV Flagyl  IV Rocephin  DVT prophylaxis: Subcu Lovenox   Objective: Vitals:   10/08/17 2126 10/08/17 2136 10/09/17 0524 10/09/17 0736  BP:  130/69 127/72   Pulse:  88 80   Resp:  14 14   Temp:  98.4 F (36.9 C) 98 F (36.7 C)   TempSrc:  Oral Oral   SpO2: 97% 98% 100% 96%  Weight:      Height:        Intake/Output Summary (Last 24 hours) at 10/09/2017 1500 Last data filed at 10/09/2017 1443 Gross per 24 hour  Intake 2000 ml  Output -  Net 2000 ml   Filed Weights   10/05/17 1227  Weight: 49 kg (108 lb)    Exam:  Awake Alert, Oriented X 3, No new F.N deficits, Normal affect  Facial swelling, neck surgical wound covered with gauze Symmetrical Chest wall movement, Good air movement bilaterally, CTAB RRR,No Gallops,Rubs or new Murmurs, No Parasternal Heave +ve B.Sounds, Abd Soft, No tenderness, No rebound - guarding or rigidity. No Cyanosis, Clubbing or edema, No new Rash or bruise  Data Reviewed: CBC: Recent Labs  Lab 10/05/17 1615 10/06/17 0502 10/08/17 0514 10/09/17 0416  WBC 11.9* 10.8* 19.4* 10.8*  NEUTROABS 9.5*  --   --   --   HGB 14.2  11.3* 11.2* 9.9*  HCT 40.7 33.2* 32.9* 28.9*  MCV 88.7 88.5 88.2 87.8  PLT 388 337 370 357   Basic Metabolic Panel: Recent Labs  Lab 10/05/17 1615 10/06/17 0502 10/08/17 0514  NA 136 136 136  K 4.2 3.4* 3.5  CL 99 107 104  CO2 24 20* 23  GLUCOSE 104* 105* 140*  BUN 20 16 9   CREATININE 0.62 0.56 0.61  CALCIUM 9.5 7.8* 8.1*   GFR: Estimated Creatinine Clearance: 58.6 mL/min (by C-G formula based on SCr of 0.61 mg/dL). Liver Function Tests: Recent Labs  Lab 10/05/17 1615  AST 17  ALT 13  ALKPHOS 83  BILITOT 0.7  PROT 8.6*  ALBUMIN 4.4   No results for input(s): LIPASE, AMYLASE in the last 168 hours. No results for input(s): AMMONIA in the last 168 hours. Coagulation Profile: No results for input(s): INR, PROTIME in the last 168 hours. Cardiac Enzymes: No results for input(s): CKTOTAL, CKMB, CKMBINDEX, TROPONINI in the last 168 hours. BNP (last 3 results) No results for input(s): PROBNP in the last 8760 hours. HbA1C: No results for input(s): HGBA1C in the last 72 hours. CBG: No results for input(s): GLUCAP in the last 168 hours. Lipid Profile: No results for input(s): CHOL, HDL, LDLCALC, TRIG, CHOLHDL, LDLDIRECT in the last 72 hours. Thyroid Function Tests: No results for input(s): TSH, T4TOTAL, FREET4, T3FREE, THYROIDAB in the last 72 hours. Anemia Panel: No results for input(s): VITAMINB12, FOLATE, FERRITIN, TIBC, IRON, RETICCTPCT in the last 72 hours. Urine analysis:    Component Value Date/Time   COLORURINE YELLOW 07/11/2013 1933   APPEARANCEUR CLEAR 07/11/2013 1933   LABSPEC 1.007 07/11/2013 1933   PHURINE 8.0 07/11/2013 1933   GLUCOSEU NEGATIVE 07/11/2013 1933   HGBUR NEGATIVE 07/11/2013 1933   BILIRUBINUR NEGATIVE 07/11/2013 1933   KETONESUR NEGATIVE 07/11/2013 1933   PROTEINUR NEGATIVE 07/11/2013 1933   UROBILINOGEN 0.2 07/11/2013 1933   NITRITE NEGATIVE 07/11/2013 1933   LEUKOCYTESUR NEGATIVE 07/11/2013 1933   Sepsis  Labs: @LABRCNTIP (procalcitonin:4,lacticidven:4)  ) Recent Results (from the past 240 hour(s))  Blood culture (routine x 2)     Status: None (Preliminary result)   Collection Time: 10/05/17  3:31 PM  Result Value Ref Range Status   Specimen Description   Final    BLOOD RIGHT ANTECUBITAL Performed at [facility], [address], [place], [place] [address]    Special Requests   Final    BOTTLES DRAWN AEROBIC AND ANAEROBIC Blood Culture adequate volume Performed at [facility], [address] [place], [place] [address]    Culture   Final    NO GROWTH 3 DAYS Performed at [facility], [address] [place], [place] [address]    Report Status PENDING  Incomplete  Blood culture (routine x 2)     Status: None (Preliminary result)   Collection Time: 10/05/17  4:15 PM  Result Value Ref Range Status   Specimen Description   Final    BLOOD LEFT WRIST Performed at [facility], [address], [place], [place] [address]    Special Requests   Final    BOTTLES DRAWN AEROBIC AND ANAEROBIC Blood Culture results may not be optimal due to an inadequate volume of blood received in culture bottles  Performed at [facility], [address], [place], [place] [address]    Culture   Final    NO GROWTH 3 DAYS Performed at [facility], [address] [place], [place] [address]    Report Status PENDING  Incomplete  Surgical pcr screen     Status: None   Collection Time: 10/07/17  1:54 PM  Result Value Ref Range Status   MRSA, PCR NEGATIVE NEGATIVE Final   Staphylococcus aureus NEGATIVE NEGATIVE Final    Comment: (NOTE) The Xpert SA Assay (FDA approved for NASAL specimens in patients 36 years of age and older), is one component of a comprehensive surveillance program. It is not intended to diagnose infection nor to guide or monitor treatment. Performed at [facility], [address] [place], [place] [address]   Aerobic/Anaerobic Culture (surgical/deep wound)     Status: None (Preliminary result)   Collection Time: 10/07/17  7:08 PM  Result Value Ref Range Status   Specimen Description   Final    WOUND Performed at [facility], [address] [place], [place] [address]    Special Requests   Final    NONE Performed at [facility], [address], [place], [place] [address]    Gram Stain   Final    MODERATE WBC PRESENT, PREDOMINANTLY PMN MODERATE GRAM NEGATIVE RODS FEW GRAM POSITIVE COCCI RARE GRAM POSITIVE RODS    Culture   Final    CULTURE REINCUBATED FOR BETTER GROWTH Performed at [facility], [address] [place], [place] [address]    Report Status PENDING  Incomplete      Studies: No results found.  Scheduled Meds: . chlorhexidine  15 mL Mouth/Throat TID  . citalopram  10 mg Oral Daily  . feeding supplement (ENSURE ENLIVE)  237 mL Oral BID BM  . gabapentin  300 mg Oral BID  . ibuprofen  600 mg Oral Q6H  . losartan  50 mg Oral Daily  . mometasone-formoterol  2 puff Inhalation BID  .  morphine injection  2 mg Intravenous Once  . nicotine  21 mg Transdermal Daily  . pantoprazole  40 mg Oral Daily    Continuous Infusions: . sodium chloride    . sodium chloride 125 mL/hr at 10/09/17 0811  . cefTRIAXone (ROCEPHIN)  IV Stopped (10/09/17 1023)  . metronidazole 500 mg (10/09/17 1348)  . [START ON 10/10/2017] vancomycin       LOS: 4 days     Huey Bienenstock, MD [facility] Pager [phone]  If 7PM-7AM, please contact night-coverage [email or web address] Password [email or web address] 10/09/2017, 3:00 PM

## 2017-10-10 ENCOUNTER — Encounter (HOSPITAL_COMMUNITY): Payer: Self-pay | Admitting: Oral Surgery

## 2017-10-10 DIAGNOSIS — M272 Inflammatory conditions of jaws: Secondary | ICD-10-CM

## 2017-10-10 LAB — BASIC METABOLIC PANEL
ANION GAP: 7 (ref 5–15)
BUN: 8 mg/dL (ref 6–20)
CHLORIDE: 110 mmol/L (ref 98–111)
CO2: 25 mmol/L (ref 22–32)
Calcium: 7.8 mg/dL — ABNORMAL LOW (ref 8.9–10.3)
Creatinine, Ser: 0.51 mg/dL (ref 0.44–1.00)
GFR calc Af Amer: >60 mL/min (ref >60)
GLUCOSE: 114 mg/dL — AB (ref 70–99)
POTASSIUM: 3.5 mmol/L (ref 3.5–5.1)
Sodium: 142 mmol/L (ref 135–145)

## 2017-10-10 LAB — AEROBIC/ANAEROBIC CULTURE W GRAM STAIN (SURGICAL/DEEP WOUND)

## 2017-10-10 LAB — CBC
HCT: 29.5 % — ABNORMAL LOW (ref 36.0–46.0)
HEMOGLOBIN: 10 g/dL — AB (ref 12.0–15.0)
MCH: 30 pg (ref 26.0–34.0)
MCHC: 33.9 g/dL (ref 30.0–36.0)
MCV: 88.6 fL (ref 78.0–100.0)
PLATELETS: 379 10*3/uL (ref 150–400)
RBC: 3.33 MIL/uL — AB (ref 3.87–5.11)
RDW: 13.7 % (ref 11.5–15.5)
WBC: 6 10*3/uL (ref 4.0–10.5)

## 2017-10-10 LAB — CULTURE, BLOOD (ROUTINE X 2)
CULTURE: NO GROWTH
Culture: NO GROWTH
SPECIAL REQUESTS: ADEQUATE

## 2017-10-10 LAB — AEROBIC/ANAEROBIC CULTURE (SURGICAL/DEEP WOUND)

## 2017-10-10 MED ORDER — NICOTINE 21 MG/24HR TD PT24: 21.0000 mg | MEDICATED_PATCH | Freq: Every day | TRANSDERMAL | 0 refills | Status: DC

## 2017-10-10 MED ORDER — OXYCODONE HCL 5 MG PO TABS: 5.0000 mg | ORAL_TABLET | Freq: Four times a day (QID) | ORAL | 0 refills | Status: DC | PRN

## 2017-10-10 MED ORDER — CIPROFLOXACIN HCL 500 MG PO TABS: 500.0000 mg | ORAL_TABLET | Freq: Two times a day (BID) | ORAL | 0 refills | Status: DC

## 2017-10-10 MED ORDER — DOXYCYCLINE HYCLATE 100 MG PO TABS: 100.0000 mg | ORAL_TABLET | Freq: Two times a day (BID) | ORAL | 0 refills | Status: DC

## 2017-10-10 MED ORDER — LACTINEX PO CHEW: 1.0000 | CHEWABLE_TABLET | Freq: Three times a day (TID) | ORAL | 0 refills | Status: DC

## 2017-10-10 NOTE — Progress Notes (Signed)
CSW met with patient at bedside to discuss and confirm discharge plan. Patient reports feeling "much better."   Patient reports she plans to stay with her friend at an Extended Stay until she figures out what to do. She  plans to reach out to her church family for support.   She reports her sister plans to transport her at discharge.   Patient reports she does not have any other needs at this time.    , LCSW, MSW Clinical Social Worker  [phone] 10/09/2017   

## 2017-10-10 NOTE — Progress Notes (Signed)
Discharge instructions reviewed with patient. All questions answered. Patient ambulated to bus stop with belongings by nurse tech

## 2017-10-10 NOTE — Discharge Instructions (Signed)
Follow with Primary MD Kirt Boysarter, Monica, DO in 7 days   Get CBC, CMP, 2 view Chest X ray checked  by Primary MD next visit.    Activity: As tolerated with Full fall precautions use walker/cane & assistance as needed   Disposition Home   Diet: Heart Healthy pured diet, with feeding assistance and aspiration precautions.    On your next visit with your primary care physician please Get Medicines reviewed and adjusted.   Please request your Prim.MD to go over all Hospital Tests and Procedure/Radiological results at the follow up, please get all Hospital records sent to your Prim MD by signing hospital release before you go home.   If you experience worsening of your admission symptoms, develop shortness of breath, life threatening emergency, suicidal or homicidal thoughts you must seek medical attention immediately by calling 911 or calling your MD immediately  if symptoms less severe.  You Must read complete instructions/literature along with all the possible adverse reactions/side effects for all the Medicines you take and that have been prescribed to you. Take any new Medicines after you have completely understood and accpet all the possible adverse reactions/side effects.   Do not drive, operating heavy machinery, perform activities at heights, swimming or participation in water activities or provide baby sitting services if your were admitted for syncope or siezures until you have seen by Primary MD or a Neurologist and advised to do so again.  Do not drive when taking Pain medications.    Do not take more than prescribed Pain, Sleep and Anxiety Medications  Special Instructions: If you have smoked or chewed Tobacco  in the last 2 yrs please stop smoking, stop any regular Alcohol  and or any Recreational drug use.  Wear Seat belts while driving.   Please note  You were cared for by a hospitalist during your hospital stay. If you have any questions about your discharge  medications or the care you received while you were in the hospital after you are discharged, you can call the unit and asked to speak with the hospitalist on call if the hospitalist that took care of you is not available. Once you are discharged, your primary care physician will handle any further medical issues. Please note that NO REFILLS for any discharge medications will be authorized once you are discharged, as it is imperative that you return to your primary care physician (or establish a relationship with a primary care physician if you do not have one) for your aftercare needs so that they can reassess your need for medications and monitor your lab values.
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
## 2017-10-10 NOTE — Progress Notes (Signed)
   10/10/17 1000  Clinical Encounter Type  Visited With Patient;Health care provider  Visit Type Initial;Psychological support;Spiritual support  Referral From Nurse  Consult/Referral To Chaplain  Spiritual Encounters  Spiritual Needs Literature;Other (Comment) Landscape architect(Advance Directive Conversation)  Stress Factors  Patient Stress Factors Family relationships;Health changes;Major life changes;Other (Comment)  Advance Directives (For Healthcare)  Does Patient Have a Medical Advance Directive? No  Does patient want to make changes to medical advance directive? Yes (Inpatient - patient requests chaplain consult to change a medical advance directive) (Patient given information )  Type of Advance Directive Healthcare Power of [place];Living will   I visited with the patient per Spiritual Care consult to discuss an Advance Directive. The patient stated that she wants to make her sister her healthcare power of attorney, not her daughter, who is next-of-kin. The patient stated that she wants to be a DNR, which I let the nurse know about. The patient states that her sister is completely against her healthcare wishes to be a DNR, and that she wants that on paper so that her sister doesn't have to make that decision if it ever comes up.  The patient stated that she will take the Advance Directive paperwork home and complete the document.   Please, contact Spiritual Care for further assistance.   Chaplain Clint BolderBrittany Torrez Renfroe M.Div., [facility]

## 2017-10-10 NOTE — Progress Notes (Signed)
Subjective POD3 s/p I&D left submandibular/masticatory space abscess and extraction of all remaining teeth. Pt feeling well per report. Minimal discomfort. Denies fevers/chills. No dyspnea/dysphagia. Tolerating foods and ensure supplements.    Objective: Vital signs in last 24 hours: Temp:  [97.7 F (36.5 C)-98.7 F (37.1 C)] 98.7 F (37.1 C) (07/23 0533) Pulse Rate:  [82-91] 83 (07/23 0533) Resp:  [14-16] 14 (07/23 0533) BP: (129-143)/(66-79) 129/79 (07/23 0533) SpO2:  [98 %-100 %] 99 % (07/23 0533) Wt Readings from Last 1 Encounters:  10/05/17 49 kg (108 lb)    Intake/Output from previous day: 07/22 0701 - 07/23 0700 In: 6880 [P.O.:3450; I.V.:3000; IV Piggyback:430] Out: 2725 [Urine:2725] Intake/Output this shift: No intake/output data recorded.  PE: Gen: awake/alert, NAD HEENT: mod left facial/cervical edema - improved, TTP, penrose drains in left cervical area with serosanguinous discharge were removed. Intraoral wounds appear hemostatic, Uvula midline, oropharynx clear.   Recent Labs    10/09/17 0416 10/10/17 0410  WBC 10.8* 6.0  HGB 9.9* 10.0*  HCT 28.9* 29.5*  PLT 357 379    Recent Labs    10/08/17 0514 10/10/17 0410  NA 136 142  K 3.5 3.5  CL 104 110  CO2 23 25  GLUCOSE 140* 114*  BUN 9 8  CREATININE 0.61 0.51  CALCIUM 8.1* 7.8*    Medications: I have reviewed the patient's current medications.  Assessment/Plan: POD2 s/p I&D left submandibular/masticatory space abscess and extraction of all remaining teeth. Progressing well thus far.  *WBC now nl *Cultures showing GNRs, GPC - Vanc added yesterday. Rocephin/Flagyl; convert to PO on discharge for 7 days. * Pain controlled - continue scheduled Ibuprofen *Cont Peridex rinses TID until further notice *soft/pureed diet at tolerated with protein supplements. *Stable from a Maxillofacial standpoint for discharge - follow [facility] 7 [facility] with [facility]. Pt to call [phone] to set up  appt.  *Please contact Dr. Kenney Housemanrab at [phone] with questions/concerns regarding the patient's surgical care. Thank you.   LOS: 5 days   Vivia EwingJustin Rebecah Dangerfield, DMD Oral & Maxillofacial Surgery 10/10/2017, 7:58 AM

## 2017-10-24 ENCOUNTER — Ambulatory Visit: Payer: Medicare HMO | Admitting: Internal Medicine

## 2017-11-08 ENCOUNTER — Encounter: Payer: Self-pay | Admitting: Internal Medicine

## 2017-12-05 ENCOUNTER — Ambulatory Visit: Payer: Medicare HMO | Admitting: Infectious Diseases

## 2017-12-06 ENCOUNTER — Other Ambulatory Visit: Payer: Self-pay | Admitting: *Deleted

## 2017-12-06 DIAGNOSIS — G609 Hereditary and idiopathic neuropathy, unspecified: Secondary | ICD-10-CM

## 2017-12-06 MED ORDER — GABAPENTIN 300 MG PO CAPS: 300.0000 mg | ORAL_CAPSULE | Freq: Two times a day (BID) | ORAL | 6 refills | Status: DC

## 2017-12-06 NOTE — Telephone Encounter (Signed)
Patient requested refill

## 2018-01-09 ENCOUNTER — Other Ambulatory Visit: Payer: Self-pay | Admitting: Internal Medicine

## 2018-01-09 DIAGNOSIS — F411 Generalized anxiety disorder: Secondary | ICD-10-CM

## 2018-01-09 DIAGNOSIS — F322 Major depressive disorder, single episode, severe without psychotic features: Secondary | ICD-10-CM

## 2018-01-29 ENCOUNTER — Other Ambulatory Visit: Payer: Self-pay | Admitting: *Deleted

## 2018-01-29 MED ORDER — ALENDRONATE SODIUM 70 MG PO TABS: ORAL_TABLET | ORAL | 2 refills | Status: DC

## 2018-01-29 NOTE — Telephone Encounter (Signed)
[facility] [address] 

## 2018-02-27 ENCOUNTER — Other Ambulatory Visit: Payer: Self-pay

## 2018-02-27 DIAGNOSIS — G609 Hereditary and idiopathic neuropathy, unspecified: Secondary | ICD-10-CM

## 2018-02-27 MED ORDER — GABAPENTIN 300 MG PO CAPS: 300.0000 mg | ORAL_CAPSULE | Freq: Two times a day (BID) | ORAL | 0 refills | Status: DC

## 2018-02-27 NOTE — Telephone Encounter (Signed)
Incoming fax received from [facility] requesting refill on Gabapentin  Patient last seen acutely in July 2019  Routine visit scheduled for 10/24/17 was canceled due to patient being homeless  No pending appointment except for AWV with Huntley DecSara in Jan 2020, I called patient to inquire about scheduling a follow-up and number has restrictions, call would not go through.   Please advise if ok to fill

## 2018-03-08 ENCOUNTER — Encounter: Payer: Self-pay | Admitting: Internal Medicine

## 2018-04-06 ENCOUNTER — Encounter: Payer: Self-pay | Admitting: Family

## 2018-04-06 ENCOUNTER — Ambulatory Visit (INDEPENDENT_AMBULATORY_CARE_PROVIDER_SITE_OTHER): Payer: Medicare HMO | Admitting: Family

## 2018-04-06 ENCOUNTER — Ambulatory Visit: Payer: Self-pay

## 2018-04-06 VITALS — BP 130/80 | HR 81 | Temp 97.9°F | Resp 18 | Ht 62.0 in | Wt 126.0 lb

## 2018-04-06 DIAGNOSIS — G2581 Restless legs syndrome: Secondary | ICD-10-CM

## 2018-04-06 DIAGNOSIS — F411 Generalized anxiety disorder: Secondary | ICD-10-CM | POA: Diagnosis not present

## 2018-04-06 DIAGNOSIS — F329 Major depressive disorder, single episode, unspecified: Secondary | ICD-10-CM

## 2018-04-06 DIAGNOSIS — G609 Hereditary and idiopathic neuropathy, unspecified: Secondary | ICD-10-CM

## 2018-04-06 DIAGNOSIS — F32A Depression, unspecified: Secondary | ICD-10-CM

## 2018-04-06 MED ORDER — CITALOPRAM HYDROBROMIDE 20 MG PO TABS: 10.0000 mg | ORAL_TABLET | Freq: Every day | ORAL | 0 refills | Status: DC

## 2018-04-06 MED ORDER — ROPINIROLE HCL 0.25 MG PO TABS: 0.2500 mg | ORAL_TABLET | Freq: Every day | ORAL | 0 refills | Status: DC

## 2018-04-06 NOTE — Patient Instructions (Signed)
1. Celexa increased to 20 mg tablet daily.notify provider if symptoms worsen or no improvement in 4 weeks.  2. Requip 0.25 mg tablet one by mouth daily at bedtime for restless leg.   3. Please follow up with counseling service for depression and anxiety.[facility] :  [facility] tel # [phone] [facility] Tel # [phone] [facility] [phone]   Restless Legs Syndrome Restless legs syndrome is a condition that causes uncomfortable feelings or sensations in the legs, especially while sitting or lying down. The sensations usually cause an overwhelming urge to move the legs. The arms can also sometimes be affected. The condition can range from mild to severe. The symptoms often interfere with a person's ability to sleep. What are the causes? The cause of this condition is not known. What increases the risk? The following factors may make you more likely to develop this condition:  Being older than 50.  Pregnancy.  Being a woman. In general, the condition is more common in women than in men.  A family history of the condition.  Having iron deficiency.  Overuse of caffeine, nicotine, or alcohol.  Certain medical conditions, such as kidney disease, Parkinson's disease, or nerve damage.  Certain medicines, such as those for high blood pressure, nausea, colds, allergies, depression, and some heart conditions. What are the signs or symptoms? The main symptom of this condition is uncomfortable sensations in the legs, such as:  Pulling.  Tingling.  Prickling.  Throbbing.  Crawling.  Burning. Usually, the sensations:  Affect both sides of the body.  Are worse when you sit or lie down.  Are worse at night. These may wake you up or make it difficult to fall asleep.  Make you have a strong urge to move your legs.  Are temporarily relieved by moving your legs. The arms can also be  affected, but this is rare. People who have this condition often have tiredness during the day because of their lack of sleep at night. How is this diagnosed? This condition may be diagnosed based on:  Your symptoms.  Blood tests. In some cases, you may be monitored in a sleep lab by a specialist (a sleep study). This can detect any disruptions in your sleep. How is this treated? This condition is treated by managing the symptoms. This may include:  Lifestyle changes, such as exercising, using relaxation techniques, and avoiding caffeine, alcohol, or tobacco.  Medicines. Anti-seizure medicines may be tried first. Follow these instructions at home:     General instructions  Take over-the-counter and prescription medicines only as told by your health care provider.  Use methods to help relieve the uncomfortable sensations, such as: ? Massaging your legs. ? Walking or stretching. ? Taking a cold or hot bath.  Keep all follow-up visits as told by your health care provider. This is important. Lifestyle  Practice good sleep habits. For example, go to bed and get up at the same time every day. Most adults should get 7-9 hours of sleep each night.  Exercise regularly. Try to get at least 30 minutes of exercise most days of the week.  Practice ways of relaxing, such as yoga or meditation.  Avoid caffeine and alcohol.  Do not use any products that contain nicotine or tobacco, such as cigarettes and e-cigarettes. If you need help quitting, ask your health care provider. Contact a health care provider if:  Your symptoms get worse or they do not improve with treatment. Summary  Restless legs syndrome is a condition that causes uncomfortable feelings or sensations in the legs, especially while sitting or lying down.  The symptoms often interfere with a person's ability to sleep.  This condition is treated by managing the symptoms. You may need to make lifestyle changes or take  medicines. This information is not intended to replace advice given to you by your health care provider. Make sure you discuss any questions you have with your health care provider. Document Released: 02/25/2002 Document Revised: 03/27/2017 Document Reviewed: 03/27/2017 Elsevier Interactive Patient Education  2019 [employer].
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
## 2018-04-18 ENCOUNTER — Telehealth: Payer: Self-pay | Admitting: *Deleted

## 2018-04-18 NOTE — Telephone Encounter (Signed)
Patient called and stated that the Requip Rx that was given to her for her RLS is making the symptoms worse. Since starting the medication she has the RLS more. When she goes to bed it is worse. Pain goes down both sides of legs and cannot sit still. Can't sleep at night due to it. Please Advise.

## 2018-04-19 NOTE — Telephone Encounter (Signed)
Patient wants to know if there is something else she can take in replace of the Requip. Please Advise.

## 2018-04-19 NOTE — Telephone Encounter (Signed)
LMOM to return call.

## 2018-04-23 ENCOUNTER — Other Ambulatory Visit: Payer: Self-pay | Admitting: Nurse Practitioner

## 2018-04-24 ENCOUNTER — Other Ambulatory Visit: Payer: Self-pay | Admitting: *Deleted

## 2018-04-24 DIAGNOSIS — G609 Hereditary and idiopathic neuropathy, unspecified: Secondary | ICD-10-CM

## 2018-04-24 MED ORDER — GABAPENTIN 300 MG PO CAPS: 300.0000 mg | ORAL_CAPSULE | Freq: Two times a day (BID) | ORAL | 0 refills | Status: DC

## 2018-04-24 NOTE — Telephone Encounter (Signed)
[facility] 

## 2018-04-24 NOTE — Telephone Encounter (Signed)
LMOM to return call.

## 2018-04-25 ENCOUNTER — Encounter: Payer: Self-pay | Admitting: *Deleted

## 2018-04-25 NOTE — Telephone Encounter (Signed)
error 

## 2018-05-03 ENCOUNTER — Other Ambulatory Visit: Payer: Self-pay | Admitting: Family

## 2018-05-04 ENCOUNTER — Ambulatory Visit (INDEPENDENT_AMBULATORY_CARE_PROVIDER_SITE_OTHER): Payer: Medicare HMO | Admitting: Family

## 2018-05-04 ENCOUNTER — Ambulatory Visit: Payer: Medicare HMO | Admitting: Nurse Practitioner

## 2018-05-04 ENCOUNTER — Encounter: Payer: Self-pay | Admitting: Family

## 2018-05-04 VITALS — BP 128/72 | HR 80 | Temp 97.9°F | Ht 62.0 in | Wt 128.0 lb

## 2018-05-04 VITALS — BP 128/72 | HR 80 | Temp 97.9°F | Resp 18 | Ht 62.0 in | Wt 128.0 lb

## 2018-05-04 DIAGNOSIS — F329 Major depressive disorder, single episode, unspecified: Secondary | ICD-10-CM | POA: Diagnosis not present

## 2018-05-04 DIAGNOSIS — Z0000 Encounter for general adult medical examination without abnormal findings: Secondary | ICD-10-CM | POA: Diagnosis not present

## 2018-05-04 DIAGNOSIS — F32A Depression, unspecified: Secondary | ICD-10-CM

## 2018-05-04 DIAGNOSIS — G2581 Restless legs syndrome: Secondary | ICD-10-CM

## 2018-05-04 MED ORDER — CITALOPRAM HYDROBROMIDE 40 MG PO TABS: 20.0000 mg | ORAL_TABLET | Freq: Every day | ORAL | 3 refills | Status: DC

## 2018-05-04 NOTE — Progress Notes (Signed)
Provider: Richarda Bladeinah Cinch Ormond FNP-C  [place], Donalee Citrininah C, NP  Patient Care Team: Reha Martinovich, Donalee Citrininah C, NP as PCP - General (Family Medicine) Chilton GreathouseMannam, Praveen, MD as Consulting Physician (Pulmonary Disease)  Extended Emergency Contact Information Primary Emergency Contact: Isaac LaudLindberg,Joy  [place] Home Phone: [phone] Mobile Phone: [phone] Relation: Sister  Goals of care: Advanced Directive information Advanced Directives 05/04/2018  Does Patient Have a Medical Advance Directive? No  Type of Advance Directive -  Does patient want to make changes to medical advance directive? -  Would patient like information on creating a medical advance directive? No - Patient declined     Chief Complaint  Patient presents with  . Follow-up    Follow up for Depression and restless leg medication. Patient states she had to stop medication made restless leg  worse and caused sleeplessness.   . Medication Management    Patient would like to try alternative to Ropinrole  prescribed for restless leg     HPI:  Pt is a 63 y.o. female seen today for an acute visit for follow up depression and Restless leg syndrome.she was here 04/06/2018 her Celexa was 10 mg tablet daily was started for depression.Requip 0.25 mg tablet daily was also initiated for restless leg syndrome.  She states still feeling depressed but much better than previous visit. She states stopped taking Requip due to worsening of symptoms and caused sleeplessness.she denies any fever,chills or acute issues.     Past Medical History:  Diagnosis Date  . Alzheimer disease (HCC)   . Anxiety   . Bilateral pneumonia 01/2017   per new patient packet   . COPD (chronic obstructive pulmonary disease) (HCC) 01/2017   per new patient packet   . Depression   . Fatty liver   . Gallbladder disease 2014   per new patient packet   . Hernia, abdominal   . Hypertension   . Liver damage 01/2017   per new patient packet, Dr.Kadakia  .  Normal cardiac stress test 2007  . Ovarian cyst 1977   8 1/2 lb left ovarian cyst, Dr.Cox, per new patient packet   . Pneumonia   . Right ovarian cyst    1992, 1993, 1994, 1995, and 1996 Dr.Neil, per new patient packet   . Stroke [facility](HCC)    2 strokes.    Past Surgical History:  Procedure Laterality Date  . ABDOMINAL HYSTERECTOMY Bilateral    Total  . CESAREAN SECTION    . CHOLECYSTECTOMY    . INGUINAL HERNIA REPAIR Right   . LEFT OOPHORECTOMY  1977   with cyst removal, age 63  . MINOR EXCISION OF ORAL LESION N/A 10/07/2017   Procedure: IRRIGATION AND DEBRIDEMENT OF ORAL INFECTION, REMOVAL OF REMAINING 8 TEETH AND PLACEMENT OF PENROSE DRAINS;  Surgeon: Vivia Ewingrab, Justin, DMD;  Location: [facility];  Service: Oral Surgery;  Laterality: N/A;  . OVARIAN CYST REMOVAL Right 93, 94,95, 96  . UMBILICAL HERNIA REPAIR      Allergies  Allergen Reactions  . Amoxicillin Hives     Has patient had a PCN reaction causing immediate rash, facial/tongue/throat swelling, SOB or lightheadedness with hypotension: Yes hives Has patient had a PCN reaction causing severe rash involving mucus membranes or skin necrosis: No Has patient had a PCN reaction that required hospitalization No Has patient had a PCN reaction occurring within the last 10 years: Yes If all of the above answers are "NO", then may proceed with Cephalosporin use.   . Clindamycin/Lincomycin  Patient states that clindamycin causes diarrhea to her.  . Sulfonamide Derivatives Nausea And Vomiting  . Wellbutrin [Bupropion] Other (See Comments)    Lightheaded Diarrhea    Outpatient Encounter Medications as of 05/04/2018  Medication Sig  . albuterol (PROVENTIL HFA;VENTOLIN HFA) 108 (90 Base) MCG/ACT inhaler Inhale 2 puffs into the lungs every 6 (six) hours as needed for wheezing or shortness of breath.  Marland Kitchen. alendronate (FOSAMAX) 70 MG tablet TAKE 1 TABLET BY MOUTH WEEKLY WITH FULL GLASS OF WATER ON EMPTY STOMACH 30 MIN PRIOR TO BREAKFAST  .  budesonide-formoterol (SYMBICORT) 160-4.5 MCG/ACT inhaler Inhale 2 puffs into the lungs as needed.   . citalopram (CELEXA) 40 MG tablet Take 0.5 tablets (20 mg total) by mouth daily for 30 days.  Marland Kitchen. gabapentin (NEURONTIN) 300 MG capsule Take 1 capsule (300 mg total) by mouth 2 (two) times daily. For nerve pain  . KLOR-CON M10 10 MEQ tablet Take 10 mEq by mouth once a week. On Wednesday  . losartan (COZAAR) 50 MG tablet Take 1 tablet (50 mg total) daily by mouth.  . nitroGLYCERIN (NITROSTAT) 0.4 MG SL tablet Place 1 tablet (0.4 mg total) under the tongue every 5 (five) minutes x 3 doses as needed for chest pain (chest pain).  . ondansetron (ZOFRAN-ODT) 4 MG disintegrating tablet Take 4 mg by mouth daily as needed.  . [DISCONTINUED] citalopram (CELEXA) 20 MG tablet Take 20 mg by mouth daily.  Marland Kitchen. rOPINIRole (REQUIP) 0.25 MG tablet TAKE 1 TABLET (0.25 MG TOTAL) BY MOUTH AT BEDTIME. (Patient not taking: Reported on 05/04/2018)  . [DISCONTINUED] citalopram (CELEXA) 20 MG tablet Take 0.5 tablets (10 mg total) by mouth daily. For mood   No facility-administered encounter medications on file as of 05/04/2018.     Review of Systems  Constitutional: Negative for appetite change, chills, fatigue and fever.  HENT: Negative for congestion, rhinorrhea, sinus pressure, sinus pain, sneezing and sore throat.   Eyes: Negative for pain, discharge, redness and itching.  Respiratory: Negative for cough, chest tightness, shortness of breath and wheezing.   Cardiovascular: Negative for chest pain, palpitations and leg swelling.  Gastrointestinal: Negative for abdominal distention, abdominal pain, constipation, diarrhea, nausea and vomiting.  Genitourinary: Negative for dysuria, flank pain, frequency and urgency.  Skin: Negative for color change, pallor and rash.  Neurological: Negative for dizziness, tremors, light-headedness and headaches.       Worsening Restless legs at night   Psychiatric/Behavioral: Negative for  agitation, confusion and sleep disturbance. The patient is not nervous/anxious.     Immunization History  Administered Date(s) Administered  . Hepatitis A, Adult 07/18/2017  . Hepatitis B, adult 06/27/2017, 07/18/2017  . Influenza Inj Mdck Quad With Preservative 02/22/2018  . Influenza,inj,Quad PF,6+ Mos 01/31/2017, 02/22/2018  . Pneumococcal Polysaccharide-23 06/27/2017  . Tetanus 03/21/2014   Pertinent  Health Maintenance Due  Topic Date Due  . MAMMOGRAM  06/14/2018 (Originally 06/08/2015)  . COLONOSCOPY  06/14/2018 (Originally 02/18/2008)  . INFLUENZA VACCINE  Completed   Fall Risk  05/04/2018 05/04/2018 04/06/2018 09/27/2017 09/20/2017  Falls in the past year? 0 0 0 No Yes  Number falls in past yr: 0 0 0 - 2 or more  Injury with Fall? 0 0 0 - No    Vitals:   05/04/18 1426  BP: 128/72  Pulse: 80  Temp: 97.9 F (36.6 C)  TempSrc: Oral  SpO2: 98%  Weight: 128 lb (58.1 kg)  Height: 5\' 2"  (1.575 m)   Body mass index is 23.41 kg/m. Physical  Exam Vitals signs reviewed.  Constitutional:      General: She is not in acute distress.    Appearance: She is normal weight.  HENT:     Head: Normocephalic.     Mouth/Throat:     Mouth: Mucous membranes are moist.     Pharynx: Oropharynx is clear. No oropharyngeal exudate or posterior oropharyngeal erythema.  Eyes:     General: No scleral icterus.       Right eye: No discharge.        Left eye: No discharge.     Conjunctiva/sclera: Conjunctivae normal.     Pupils: Pupils are equal, round, and reactive to light.  Cardiovascular:     Rate and Rhythm: Normal rate and regular rhythm.     Heart sounds: Normal heart sounds. No murmur. No friction rub. No gallop.   Pulmonary:     Effort: Pulmonary effort is normal. No respiratory distress.     Breath sounds: Normal breath sounds. No wheezing, rhonchi or rales.  Chest:     Chest wall: No tenderness.  Abdominal:     General: Bowel sounds are normal. There is no distension.      Palpations: Abdomen is soft. There is no mass.     Tenderness: There is no abdominal tenderness. There is no right CVA tenderness, left CVA tenderness, guarding or rebound.     Hernia: A hernia is present.  Musculoskeletal: Normal range of motion.        General: No tenderness.     Right lower leg: No edema.     Left lower leg: No edema.  Skin:    General: Skin is warm and dry.     Coloration: Skin is not pale.     Findings: No erythema, lesion or rash.  Neurological:     Mental Status: She is alert and oriented to person, place, and time.     Cranial Nerves: No cranial nerve deficit.     Sensory: No sensory deficit.     Coordination: Coordination normal.     Gait: Gait normal.  Psychiatric:        Mood and Affect: Mood is anxious and depressed.        Speech: Speech normal.        Behavior: Behavior normal.        Thought Content: Thought content does not include suicidal ideation. Thought content does not include suicidal plan.        Judgment: Judgment normal.     Labs reviewed: Recent Labs    09/20/17 1030  10/06/17 0502 10/08/17 0514 10/10/17 0410  NA 137   < > 136 136 142  K 4.3   < > 3.4* 3.5 3.5  CL 104   < > 107 104 110  CO2 24   < > 20* 23 25  GLUCOSE 95   < > 105* 140* 114*  BUN 16   < > 16 9 8   CREATININE 0.68   < > 0.56 0.61 0.51  CALCIUM 9.7   < > 7.8* 8.1* 7.8*  MG 1.9  --   --   --   --    < > = values in this interval not displayed.   Recent Labs    05/26/17 1406  07/18/17 1414 09/20/17 1030 10/05/17 1615  AST 104*  --  19 13 17   ALT 130*   < > 13 9 13   ALKPHOS 59  --   --   --  83  BILITOT 0.6  --  0.8 0.4 0.7  PROT 7.3  --  7.3 7.0 8.6*  ALBUMIN 4.3  --   --   --  4.4   < > = values in this interval not displayed.   Recent Labs    10/05/17 1615  10/08/17 0514 10/09/17 0416 10/10/17 0410  WBC 11.9*   < > 19.4* 10.8* 6.0  NEUTROABS 9.5*  --   --   --   --   HGB 14.2   < > 11.2* 9.9* 10.0*  HCT 40.7   < > 32.9* 28.9* 29.5*  MCV 88.7    < > 88.2 87.8 88.6  PLT 388   < > 370 357 379   < > = values in this interval not displayed.   Lab Results  Component Value Date   TSH 1.840 07/12/2013   Lab Results  Component Value Date   HGBA1C 5.6 05/16/2017    Significant Diagnostic Results in last 30 days:  No results found.  Assessment/Plan 1. Restless leg syndrome Worsening symptoms.Has stopped ReQuip.Refer to ambulatory  Neurology for Restless leg syndrome.  2. Depression, unspecified depression type Symptoms has improved compared to previous visit increase Celexa from 10 mg tablet to 20 mg tablet daily.  - follow up in 4 weeks for evaluation.   Family/ staff Communication: Reviewed plan of care with patient and facility Nurse supervisor  Labs/tests ordered: None  Follow up: 4 weeks for depression evaluation.   Caesar Bookman, NP

## 2018-05-04 NOTE — Progress Notes (Addendum)
Subjective:   Maylon Pepperserry Rodak is a 63 y.o. female who presents for Medicare Annual (Subsequent) preventive examination.  Review of Systems:   Cardiac Risk Factors include: family history of premature cardiovascular disease;hypertension;smoking/ tobacco exposure     Objective:     Vitals: BP 128/72   Pulse 80   Temp 97.9 F (36.6 C) (Oral)   Resp 18   Ht 5\' 2"  (1.575 m)   Wt 128 lb (58.1 kg)   SpO2 98%   BMI 23.41 kg/m   Body mass index is 23.41 kg/m.  Advanced Directives 05/04/2018 05/04/2018 10/10/2017 10/06/2017 10/06/2017 10/05/2017 10/05/2017  Does Patient Have a Medical Advance Directive? No No No No Yes - Yes  Type of Advance Directive - - Healthcare Power of [place];Living will Healthcare Power of Attorney (No Data) - (No Data)  Does patient want to make changes to medical advance directive? - - Yes (Inpatient - patient requests chaplain consult to change a medical advance directive) Yes (Inpatient - patient requests chaplain consult to change a medical advance directive) No - Patient declined No - Patient declined No - Patient declined  Would patient like information on creating a medical advance directive? (No Data) No - Patient declined - Yes (Inpatient - patient requests chaplain consult to create a medical advance directive) - - -    Tobacco Social History   Tobacco Use  Smoking Status Current Every Day Smoker  . Packs/day: 0.75  . Years: 46.00  . Pack years: 34.50  . Types: Cigarettes  Smokeless Tobacco Never Used  Tobacco Comment   Takes 2-3 puffs when smoking, 4 cigarettes a day      Ready to quit: Not Answered Counseling given: Not Answered Comment: Takes 2-3 puffs when smoking, 4 cigarettes a day    Clinical Intake:  Pre-visit preparation completed: No  Pain : 0-10 Pain Score: 8  Pain Type: Chronic pain Pain Location: [place]mbilicus Pain Orientation: Other (Comment)(abdominal hernia ) Pain Descriptors / Indicators: Aching Pain Onset: Other  (comment)(three months ) Pain Frequency: Constant Pain Relieving Factors: ibprofen Effect of Pain on Daily Activities: Yes   Pain Relieving Factors: ibprofen  BMI - recorded: 23.41 Nutritional Status: BMI of 19-24  Normal Nutritional Risks: None Diabetes: No  How often do you need to have someone help you when you read instructions, pamphlets, or other written materials from your doctor or pharmacy?: 1 - Never What is the last grade level you completed in school?: 6 months college   Interpreter Needed?: No  Information entered by :: Dinah Ngetich FNP-C   Past Medical History:  Diagnosis Date  . Alzheimer disease (HCC)   . Anxiety   . Bilateral pneumonia 01/2017   per new patient packet   . COPD (chronic obstructive pulmonary disease) (HCC) 01/2017   per new patient packet   . Depression   . Fatty liver   . Gallbladder disease 2014   per new patient packet   . Hernia, abdominal   . Hypertension   . Liver damage 01/2017   per new patient packet, Dr.Kadakia  . Normal cardiac stress test 2007  . Ovarian cyst 1977   8 1/2 lb left ovarian cyst, Dr.Cox, per new patient packet   . Pneumonia   . Right ovarian cyst    1992, 1993, 1994, 1995, and 1996 Dr.Neil, per new patient packet   . Stroke [facility](HCC)    2 strokes.    Past Surgical History:  Procedure Laterality Date  . ABDOMINAL HYSTERECTOMY Bilateral  Total  . CESAREAN SECTION    . CHOLECYSTECTOMY    . INGUINAL HERNIA REPAIR Right   . LEFT OOPHORECTOMY  1977   with cyst removal, age 61  . MINOR EXCISION OF ORAL LESION N/A 10/07/2017   Procedure: IRRIGATION AND DEBRIDEMENT OF ORAL INFECTION, REMOVAL OF REMAINING 8 TEETH AND PLACEMENT OF PENROSE DRAINS;  Surgeon: Vivia Ewing, DMD;  Location: [facility];  Service: Oral Surgery;  Laterality: N/A;  . OVARIAN CYST REMOVAL Right 93, 94,95, 96  . UMBILICAL HERNIA REPAIR     Family History  Problem Relation Age of Onset  . Stroke Mother   . COPD Mother   . Dementia Mother   .  Breast cancer Mother   . Diabetes Father   . Heart attack Father   . Diabetes Sister   . Stroke Sister        x2  . Hepatitis C Daughter   . Diabetes Paternal Grandmother    Social History   Socioeconomic History  . Marital status: Divorced    Spouse name: Not on file  . Number of children: 1  . Years of education: Not on file  . Highest education level: Not on file  Occupational History  . Occupation: disabled  Social Needs  . Financial resource strain: Not on file  . Food insecurity:    Worry: Not on file    Inability: Not on file  . Transportation needs:    Medical: Not on file    Non-medical: Not on file  Tobacco Use  . Smoking status: Current Every Day Smoker    Packs/day: 0.75    Years: 46.00    Pack years: 34.50    Types: Cigarettes  . Smokeless tobacco: Never Used  . Tobacco comment: Takes 2-3 puffs when smoking, 4 cigarettes a day   Substance and Sexual Activity  . Alcohol use: Yes    Alcohol/week: 1.0 standard drinks    Types: 1 Cans of beer per week    Frequency: Never    Comment: once or twice a year.   . Drug use: No  . Sexual activity: Never    Comment: last partner ex [employer] 8 years ago   Lifestyle  . Physical activity:    Days per week: Not on file    Minutes per session: Not on file  . Stress: Not on file  Relationships  . Social connections:    Talks on phone: Not on file    Gets together: Not on file    Attends religious service: Not on file    Active member of club or organization: Not on file    Attends meetings of clubs or organizations: Not on file    Relationship status: Not on file  Other Topics Concern  . 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 No   Do you have difficulty preparing food or eating? No   Do you have difficulty managing your medications? No   Do you have difficulty managing your finances? No   Do you have difficulty affording your medications? Yes Inhalers     Outpatient Encounter Medications as of 05/04/2018  Medication Sig  . albuterol (PROVENTIL HFA;VENTOLIN HFA) 108 (90 Base) MCG/ACT inhaler Inhale 2 puffs into the lungs every 6 (six) hours as needed for wheezing or shortness of breath.  Marland Kitchen alendronate (FOSAMAX) 70 MG tablet TAKE 1 TABLET BY MOUTH WEEKLY WITH FULL GLASS OF WATER ON EMPTY STOMACH 30 MIN PRIOR TO BREAKFAST  . budesonide-formoterol (SYMBICORT) 160-4.5 MCG/ACT inhaler Inhale 2 puffs into the lungs as needed.   . gabapentin (NEURONTIN) 300 MG capsule Take 1 capsule (300 mg total) by mouth 2 (two) times daily. For nerve pain  . KLOR-CON M10 10 MEQ tablet Take 10 mEq by mouth once a week. On Wednesday  . losartan (COZAAR) 50 MG tablet Take 1 tablet (50 mg total) daily by mouth.  . nitroGLYCERIN (NITROSTAT) 0.4 MG SL tablet Place 1 tablet (0.4 mg total) under the tongue every 5 (five) minutes x 3 doses as needed for chest pain (chest pain).  . ondansetron (ZOFRAN-ODT) 4 MG disintegrating tablet Take 4 mg by mouth daily as needed.  . [DISCONTINUED] citalopram (CELEXA) 20 MG tablet Take 20 mg by mouth daily.  Marland Kitchen rOPINIRole (REQUIP) 0.25 MG tablet TAKE 1 TABLET (0.25 MG TOTAL) BY MOUTH AT BEDTIME. (Patient not taking: Reported on 05/04/2018)  . [DISCONTINUED] citalopram (CELEXA) 20 MG tablet Take 0.5 tablets (10 mg total) by mouth daily. For mood   No facility-administered encounter medications on file as of 05/04/2018.     Activities of Daily Living In your present state of health, do you have any difficulty performing the  following activities: 05/04/2018 10/05/2017  Hearing? N -  Vision? N -  Difficulty concentrating or making decisions? N -  Walking or climbing stairs? Y -  Comment pain on the legs- calf -  Dressing or bathing? N -  Doing errands, shopping? N N  Preparing Food and eating ? (No Data) -  Comment does not prepare food anymore  -  Using the Toilet? N -  In the past six months, have you accidently leaked urine? N -  Do you have problems with loss of bowel control? N -  Managing your Medications? N -  Managing your Finances? N -  Housekeeping or managing your Housekeeping? N -  Some recent data might be hidden    Patient Care Team: Ngetich, Donalee Citrin, NP as PCP - General (Family Medicine) Chilton Greathouse, MD as Consulting Physician (Pulmonary Disease)    Assessment:   This is a routine wellness examination for [employer].  Exercise Activities and Dietary recommendations Current Exercise Habits: Home exercise routine, Type of exercise: walking, Time (Minutes): 60, Frequency (Times/Week): 7, Weekly Exercise (Minutes/Week): 420, Intensity: Mild, Exercise limited by: Other - see comments(pain on the legs )  Goals    . Quit Smoking       Fall Risk Fall Risk  05/04/2018 05/04/2018 04/06/2018 09/27/2017 09/20/2017  Falls in the past year? 0 0 0 No Yes  Number falls in past yr: 0 0 0 - 2 or more  Injury with Fall? 0 0 0 - No   Is the patient's home free of loose throw rugs in walkways, pet beds, electrical cords, etc?   yes  Grab bars in the bathroom? no      Handrails on the stairs?   yes      Adequate lighting?   yes  Depression Screen PHQ 2/9 Scores 05/04/2018 05/04/2018 04/06/2018 09/27/2017  PHQ - 2 Score - - 0 0  PHQ- 9 Score - - - -  Exception Documentation Other- indicate reason in comment box Medical reason - -  Not completed Patient with dignosis of depression  - - -     Cognitive Function MMSE - Mini Mental State Exam 04/05/2017  Orientation to time 5  Orientation to Place 5    Registration 3  Attention/ Calculation 5  Recall 2  Language- name 2 objects 2  Language- repeat 1  Language- follow 3 step command 3  Language- read & follow direction 1  Write a sentence 1  Copy design 1  Total score 29        Immunization History  Administered Date(s) Administered  . Hepatitis A, Adult 07/18/2017  . Hepatitis B, adult 06/27/2017, 07/18/2017  . Influenza Inj Mdck Quad With Preservative 02/22/2018  . Influenza,inj,Quad PF,6+ Mos 01/31/2017, 02/22/2018  . Pneumococcal Polysaccharide-23 06/27/2017  . Tetanus 03/21/2014    Qualifies for Shingles Vaccine? Had shingles 28 years ago   Screening Tests Health Maintenance  Topic Date Due  . MAMMOGRAM  06/14/2018 (Originally 06/08/2015)  . COLONOSCOPY  06/14/2018 (Originally 02/18/2008)  . TETANUS/TDAP  03/21/2024  . INFLUENZA VACCINE  Completed  . Hepatitis C Screening  Completed  . HIV Screening  Completed    Cancer Screenings: Lung: Low Dose CT Chest recommended if Age 51-80 years, 30 pack-year currently smoking OR have quit w/in 15years. Patient qualify done in 05/2017. Breast:  Up to date on Mammogram? Declined   Up to date of Bone Density/Dexa? Yes Colorectal: declined   Additional Screenings:  Hepatitis C Screening: Treated for Hep C x 8 weeks.      Plan:     - Advance Directives information given patient will provide copy when filed.   I have personally reviewed and noted the following in the patient's chart:   . Medical and social history . Use of alcohol, tobacco or illicit drugs  . Current medications and supplements . Functional ability and status . Nutritional status . Physical activity . Advanced directives . List of other physicians . Hospitalizations, surgeries, and ER visits in previous 12 months . Vitals . Screenings to include cognitive, depression, and falls . Referrals and appointments  In addition, I have reviewed and discussed with patient certain preventive protocols,  quality metrics, and best practice recommendations. A written personalized care plan for preventive services as well as general preventive health recommendations were provided to patient.   Caesar Bookman, NP  05/04/2018

## 2018-05-04 NOTE — Patient Instructions (Signed)
1. Celexa increased to 40 mg tablet one by mouth daily for depression. 2. Follow up with Neurology for Restless leg syndrome.  Restless Legs Syndrome Restless legs syndrome is a condition that causes uncomfortable feelings or sensations in the legs, especially while sitting or lying down. The sensations usually cause an overwhelming urge to move the legs. The arms can also sometimes be affected. The condition can range from mild to severe. The symptoms often interfere with a person's ability to sleep. What are the causes? The cause of this condition is not known. What increases the risk? The following factors may make you more likely to develop this condition:  Being older than 50.  Pregnancy.  Being a woman. In general, the condition is more common in women than in men.  A family history of the condition.  Having iron deficiency.  Overuse of caffeine, nicotine, or alcohol.  Certain medical conditions, such as kidney disease, Parkinson's disease, or nerve damage.  Certain medicines, such as those for high blood pressure, nausea, colds, allergies, depression, and some heart conditions. What are the signs or symptoms? The main symptom of this condition is uncomfortable sensations in the legs, such as:  Pulling.  Tingling.  Prickling.  Throbbing.  Crawling.  Burning. Usually, the sensations:  Affect both sides of the body.  Are worse when you sit or lie down.  Are worse at night. These may wake you up or make it difficult to fall asleep.  Make you have a strong urge to move your legs.  Are temporarily relieved by moving your legs. The arms can also be affected, but this is rare. People who have this condition often have tiredness during the day because of their lack of sleep at night. How is this diagnosed? This condition may be diagnosed based on:  Your symptoms.  Blood tests. In some cases, you may be monitored in a sleep lab by a specialist (a sleep study).  This can detect any disruptions in your sleep. How is this treated? This condition is treated by managing the symptoms. This may include:  Lifestyle changes, such as exercising, using relaxation techniques, and avoiding caffeine, alcohol, or tobacco.  Medicines. Anti-seizure medicines may be tried first. Follow these instructions at home:     General instructions  Take over-the-counter and prescription medicines only as told by your health care provider.  Use methods to help relieve the uncomfortable sensations, such as: ? Massaging your legs. ? Walking or stretching. ? Taking a cold or hot bath.  Keep all follow-up visits as told by your health care provider. This is important. Lifestyle  Practice good sleep habits. For example, go to bed and get up at the same time every day. Most adults should get 7-9 hours of sleep each night.  Exercise regularly. Try to get at least 30 minutes of exercise most days of the week.  Practice ways of relaxing, such as yoga or meditation.  Avoid caffeine and alcohol.  Do not use any products that contain nicotine or tobacco, such as cigarettes and e-cigarettes. If you need help quitting, ask your health care provider. Contact a health care provider if:  Your symptoms get worse or they do not improve with treatment. Summary  Restless legs syndrome is a condition that causes uncomfortable feelings or sensations in the legs, especially while sitting or lying down.  The symptoms often interfere with a person's ability to sleep.  This condition is treated by managing the symptoms. You may need to make  lifestyle changes or take medicines. This information is not intended to replace advice given to you by your health care provider. Make sure you discuss any questions you have with your health care provider. Document Released: 02/25/2002 Document Revised: 03/27/2017 Document Reviewed: 03/27/2017 Elsevier Interactive Patient Education  2019  [employer].

## 2018-05-04 NOTE — Patient Instructions (Signed)
Brittany [name] , Thank you for taking time to come for your Medicare Wellness Visit. I appreciate your ongoing commitment to your health goals. Please review the following plan we discussed and let me know if I can assist you in the future.   Screening recommendations/referrals: Colonoscopy: Declined  Mammogram: Declined  Bone Density: completed  Recommended yearly ophthalmology/optometry visit for glaucoma screening and checkup Recommended yearly dental visit for hygiene and checkup  Vaccinations: Influenza vaccine: Up to date  Pneumococcal vaccine: Up to date  Tdap vaccine: Last administered 03/21/2014  Shingles vaccine: Has had shingles.   Advanced directives:Information provided todays  Conditions/risks identified: Family history of premature CVD,HTN,Smoker  Next appointment: 1 year   Preventive Care 81-64 Years, Female Preventive care refers to lifestyle choices and visits with your health care provider that can promote health and wellness. What does preventive care include?  A yearly physical exam. This is also called an annual well check.  Dental exams once or twice a year.  Routine eye exams. Ask your health care provider how often you should have your eyes checked.  Personal lifestyle choices, including:  Daily care of your teeth and gums.  Regular physical activity.  Eating a healthy diet.  Avoiding tobacco and drug use.  Limiting alcohol use.  Practicing safe sex.  Taking low-dose aspirin daily starting at age 80.  Taking vitamin and mineral supplements as recommended by your health care provider. What happens during an annual well check? The services and screenings done by your health care provider during your annual well check will depend on your age, overall health, lifestyle risk factors, and family history of disease. Counseling  Your health care provider may ask you questions about your:  Alcohol use.  Tobacco use.  Drug use.  Emotional  well-being.  Home and relationship well-being.  Sexual activity.  Eating habits.  Work and work Statistician.  Method of birth control.  Menstrual cycle.  Pregnancy history. Screening  You may have the following tests or measurements:  Height, weight, and BMI.  Blood pressure.  Lipid and cholesterol levels. These may be checked every 5 years, or more frequently if you are over 73 years old.  Skin check.  Lung cancer screening. You may have this screening every year starting at age 49 if you have a 30-pack-year history of smoking and currently smoke or have quit within the past 15 years.  Fecal occult blood test (FOBT) of the stool. You may have this test every year starting at age 83.  Flexible sigmoidoscopy or colonoscopy. You may have a sigmoidoscopy every 5 years or a colonoscopy every 10 years starting at age 33.  Hepatitis C blood test.  Hepatitis B blood test.  Sexually transmitted disease (STD) testing.  Diabetes screening. This is done by checking your blood sugar (glucose) after you have not eaten for a while (fasting). You may have this done every 1-3 years.  Mammogram. This may be done every 1-2 years. Talk to your health care provider about when you should start having regular mammograms. This may depend on whether you have a family history of breast cancer.  BRCA-related cancer screening. This may be done if you have a family history of breast, ovarian, tubal, or peritoneal cancers.  Pelvic exam and Pap test. This may be done every 3 years starting at age 62. Starting at age 12, this may be done every 5 years if you have a Pap test in combination with an HPV test.  Bone density scan.  This is done to screen for osteoporosis. You may have this scan if you are at high risk for osteoporosis. Discuss your test results, treatment options, and if necessary, the need for more tests with your health care provider. Vaccines  Your health care provider may recommend  certain vaccines, such as:  Influenza vaccine. This is recommended every year.  Tetanus, diphtheria, and acellular pertussis (Tdap, Td) vaccine. You may need a Td booster every 10 years.  Zoster vaccine. You may need this after age 26.  Pneumococcal 13-valent conjugate (PCV13) vaccine. You may need this if you have certain conditions and were not previously vaccinated.  Pneumococcal polysaccharide (PPSV23) vaccine. You may need one or two doses if you smoke cigarettes or if you have certain conditions. Talk to your health care provider about which screenings and vaccines you need and how often you need them. This information is not intended to replace advice given to you by your health care provider. Make sure you discuss any questions you have with your health care provider. Document Released: 04/03/2015 Document Revised: 11/25/2015 Document Reviewed: 01/06/2015 Elsevier Interactive Patient Education  2017 [place] Prevention in the Home Falls can cause injuries. They can happen to people of all ages. There are many things you can do to make your home safe and to help prevent falls. What can I do on the outside of my home?  Regularly fix the edges of walkways and driveways and fix any cracks.  Remove anything that might make you trip as you walk through a door, such as a raised step or threshold.  Trim any bushes or trees on the path to your home.  Use bright outdoor lighting.  Clear any walking paths of anything that might make someone trip, such as rocks or tools.  Regularly check to see if handrails are loose or broken. Make sure that both sides of any steps have handrails.  Any raised decks and porches should have guardrails on the edges.  Have any leaves, snow, or ice cleared regularly.  Use sand or salt on walking paths during winter.  Clean up any spills in your garage right away. This includes oil or grease spills. What can I do in the bathroom?  Use  night lights.  Install grab bars by the toilet and in the tub and shower. Do not use towel bars as grab bars.  Use non-skid mats or decals in the tub or shower.  If you need to sit down in the shower, use a plastic, non-slip stool.  Keep the floor dry. Clean up any water that spills on the floor as soon as it happens.  Remove soap buildup in the tub or shower regularly.  Attach bath mats securely with double-sided non-slip rug tape.  Do not have throw rugs and other things on the floor that can make you trip. What can I do in the bedroom?  Use night lights.  Make sure that you have a light by your bed that is easy to reach.  Do not use any sheets or blankets that are too big for your bed. They should not hang down onto the floor.  Have a firm chair that has side arms. You can use this for support while you get dressed.  Do not have throw rugs and other things on the floor that can make you trip. What can I do in the kitchen?  Clean up any spills right away.  Avoid walking on wet floors.  Keep items that you use a lot in easy-to-reach places.  If you need to reach something above you, use a strong step stool that has a grab bar.  Keep electrical cords out of the way.  Do not use floor polish or wax that makes floors slippery. If you must use wax, use non-skid floor wax.  Do not have throw rugs and other things on the floor that can make you trip. What can I do with my stairs?  Do not leave any items on the stairs.  Make sure that there are handrails on both sides of the stairs and use them. Fix handrails that are broken or loose. Make sure that handrails are as long as the stairways.  Check any carpeting to make sure that it is firmly attached to the stairs. Fix any carpet that is loose or worn.  Avoid having throw rugs at the top or bottom of the stairs. If you do have throw rugs, attach them to the floor with carpet tape.  Make sure that you have a light switch at  the top of the stairs and the bottom of the stairs. If you do not have them, ask someone to add them for you. What else can I do to help prevent falls?  Wear shoes that:  Do not have high heels.  Have rubber bottoms.  Are comfortable and fit you well.  Are closed at the toe. Do not wear sandals.  If you use a stepladder:  Make sure that it is fully opened. Do not climb a closed stepladder.  Make sure that both sides of the stepladder are locked into place.  Ask someone to hold it for you, if possible.  Clearly mark and make sure that you can see:  Any grab bars or handrails.  First and last steps.  Where the edge of each step is.  Use tools that help you move around (mobility aids) if they are needed. These include:  Canes.  Walkers.  Scooters.  Crutches.  Turn on the lights when you go into a dark area. Replace any light bulbs as soon as they burn out.  Set up your furniture so you have a clear path. Avoid moving your furniture around.  If any of your floors are uneven, fix them.  If there are any pets around you, be aware of where they are.  Review your medicines with your doctor. Some medicines can make you feel dizzy. This can increase your chance of falling. Ask your doctor what other things that you can do to help prevent falls. This information is not intended to replace advice given to you by your health care provider. Make sure you discuss any questions you have with your health care provider. Document Released: 01/01/2009 Document Revised: 08/13/2015 Document Reviewed: 04/11/2014 Elsevier Interactive Patient Education  2017 [employer].

## 2018-05-17 ENCOUNTER — Telehealth: Payer: Self-pay | Admitting: Family

## 2018-05-17 NOTE — Telephone Encounter (Signed)
Please contact patient to see if willing to get an annually CT scan screening due to her history of smoking per Bevelyn Ngo. NP patient is in a program that requires annual CT scan screening.

## 2018-05-18 NOTE — Telephone Encounter (Signed)
Tried calling patient to see if she would be willing to get annual ct scan screening due to her history of smoking per Dinah's phone note created on 05/17/2018.  No answer. Left message for patient to call office.

## 2018-06-01 ENCOUNTER — Ambulatory Visit: Payer: Medicare HMO | Admitting: Family

## 2018-06-07 ENCOUNTER — Other Ambulatory Visit: Payer: Self-pay | Admitting: Family

## 2018-07-10 ENCOUNTER — Encounter: Payer: Self-pay | Admitting: Neurology

## 2018-07-10 ENCOUNTER — Ambulatory Visit (INDEPENDENT_AMBULATORY_CARE_PROVIDER_SITE_OTHER): Payer: Medicare HMO | Admitting: Neurology

## 2018-07-10 ENCOUNTER — Other Ambulatory Visit: Payer: Self-pay

## 2018-07-10 DIAGNOSIS — G2581 Restless legs syndrome: Secondary | ICD-10-CM

## 2018-07-10 DIAGNOSIS — G609 Hereditary and idiopathic neuropathy, unspecified: Secondary | ICD-10-CM

## 2018-07-10 DIAGNOSIS — G629 Polyneuropathy, unspecified: Secondary | ICD-10-CM | POA: Insufficient documentation

## 2018-07-10 MED ORDER — GABAPENTIN 300 MG PO CAPS: 300.0000 mg | ORAL_CAPSULE | Freq: Four times a day (QID) | ORAL | 11 refills | Status: DC

## 2018-07-10 NOTE — Progress Notes (Signed)
PATIENT: Brittany [name] DOB: [birth year]  Virtual Visit via Video  I connected with Brittany [name] on 07/10/18 at  by Video and verified that I am speaking with the correct person using two identifiers.   I discussed the limitations, risks, security and privacy concerns of performing an evaluation and management service by video and the availability of in person appointments. I also discussed with the patient that there may be a patient responsible charge related to this service. The patient expressed understanding and agreed to proceed.  HISTORICAL  Brittany [name] is a 63 years old female, seen in request by her primary care NP Ngetich, Dinah C for evaluation of restless leg syndrome.  I have reviewed and summarized the referring note from the referring physician.  She has past medical history of hypertension, osteoporosis, peripheral neuropathy, citalopram, COPD, long time smoker  She reported history of bilateral feet paresthesia since 2019, was diagnosed with peripheral neuropathy, per patient, there was no etiology found, she has no history of diabetes, she was given prescription of gabapentin 300 mg twice daily which has been helpful, she underwent  Odontogenic left facial infection in July, CT scan showed left lower face cellulitis, myositis, require surgery,  Laboratory evaluations on October 10, 2017 showed hemoglobin of 10, WBC of 6.0, BMP showed low calcium 7.8, glucose of 114,  Since summer 2019, she noticed restless leg symptoms, difficulty falling to sleep when lying down still at nighttime, urge to move her leg, less symptomatic during the day, but with prolonged sitting, she would have similar recurrent restless, urge to move, sometimes spreading to bilateral upper extremity  She had a short trial of Requip 0.25 mg every night for 2 weeks, did not improve her symptoms, "driving me crazy"  She denies significant gait abnormality  Observations/Objective: I have reviewed problem  lists, medications, allergies.  Assessment and Plan: Restless leg symptoms  Reported history of peripheral neuropathy,  July 2019 laboratory evaluation showed anemia hemoglobin of 10  Laboratory evaluation including B12, folic acid, ferritin level  Will increase her gabapentin to 300 mg up to 4 tablets a day  EMG nerve conduction study  Follow Up Instructions:  In 3 months    I discussed the assessment and treatment plan with the patient. The patient was provided an opportunity to ask questions and all were answered. The patient agreed with the plan and demonstrated an understanding of the instructions.   The patient was advised to call back or seek an in-person evaluation if the symptoms worsen or if the condition fails to improve as anticipated.  I provided 30 minutes minutes of non-face-to-face time during this encounter.  REVIEW OF SYSTEMS: Full 14 system review of systems performed and notable only for as above All other review of systems were negative.  ALLERGIES: Allergies  Allergen Reactions  . Amoxicillin Hives     Has patient had a PCN reaction causing immediate rash, facial/tongue/throat swelling, SOB or lightheadedness with hypotension: Yes hives Has patient had a PCN reaction causing severe rash involving mucus membranes or skin necrosis: No Has patient had a PCN reaction that required hospitalization No Has patient had a PCN reaction occurring within the last 10 years: Yes If all of the above answers are "NO", then may proceed with Cephalosporin use.   . Clindamycin/Lincomycin     Patient states that clindamycin causes diarrhea to her.  . Sulfonamide Derivatives Nausea And Vomiting  . Wellbutrin [Bupropion] Other (See Comments)    Lightheaded Diarrhea  HOME MEDICATIONS: Current Outpatient Medications  Medication Sig Dispense Refill  . albuterol (PROVENTIL HFA;VENTOLIN HFA) 108 (90 Base) MCG/ACT inhaler Inhale 2 puffs into the lungs every 6 (six) hours as  needed for wheezing or shortness of breath.    . alendronate (FOSAMAX) 70 MG tablet TAKE 1Marland Kitchen TABLET BY MOUTH WEEKLY WITH FULL GLASS OF WATER ON EMPTY STOMACH 30 MIN PRIOR TO BREAKFAST 12 tablet 0  . budesonide-formoterol (SYMBICORT) 160-4.5 MCG/ACT inhaler Inhale 2 puffs into the lungs as needed.     . citalopram (CELEXA) 40 MG tablet Take 0.5 tablets (20 mg total) by mouth daily for 30 days. 30 tablet 3  . gabapentin (NEURONTIN) 300 MG capsule Take 1 capsule (300 mg total) by mouth 2 (two) times daily. For nerve pain 60 capsule 0  . KLOR-CON M10 10 MEQ tablet Take 10 mEq by mouth once a week. On Wednesday  3  . losartan (COZAAR) 50 MG tablet Take 1 tablet (50 mg total) daily by mouth. 30 tablet 1  . nitroGLYCERIN (NITROSTAT) 0.4 MG SL tablet Place 1 tablet (0.4 mg total) under the tongue every 5 (five) minutes x 3 doses as needed for chest pain (chest pain). 25 tablet 1  . ondansetron (ZOFRAN-ODT) 4 MG disintegrating tablet Take 4 mg by mouth daily as needed.  3  . rOPINIRole (REQUIP) 0.25 MG tablet TAKE 1 TABLET (0.25 MG TOTAL) BY MOUTH AT BEDTIME. 30 tablet 5   No current facility-administered medications for this visit.     PAST MEDICAL HISTORY: Past Medical History:  Diagnosis Date  . Alzheimer disease (HCC)   . Anxiety   . Bilateral pneumonia 01/2017   per new patient packet   . COPD (chronic obstructive pulmonary disease) (HCC) 01/2017   per new patient packet   . Depression   . Fatty liver   . Gallbladder disease 2014   per new patient packet   . Hernia, abdominal   . Hypertension   . Liver damage 01/2017   per new patient packet, Dr.Kadakia  . Normal cardiac stress test 2007  . Ovarian cyst 1977   8 1/2 lb left ovarian cyst, Dr.Cox, per new patient packet   . Pneumonia   . Right ovarian cyst    1992, 1993, 1994, 1995, and 1996 Dr.Neil, per new patient packet   . Stroke [facility])    2 strokes.     PAST SURGICAL HISTORY: Past Surgical History:  Procedure Laterality Date   . ABDOMINAL HYSTERECTOMY Bilateral    Total  . CESAREAN SECTION    . CHOLECYSTECTOMY    . INGUINAL HERNIA REPAIR Right   . LEFT OOPHORECTOMY  1977   with cyst removal, age 74  . MINOR EXCISION OF ORAL LESION N/A 10/07/2017   Procedure: IRRIGATION AND DEBRIDEMENT OF ORAL INFECTION, REMOVAL OF REMAINING 8 TEETH AND PLACEMENT OF PENROSE DRAINS;  Surgeon: Vivia Ewing, DMD;  Location: [facility];  Service: Oral Surgery;  Laterality: N/A;  . OVARIAN CYST REMOVAL Right 93, 94,95, 96  . UMBILICAL HERNIA REPAIR      FAMILY HISTORY: Family History  Problem Relation Age of Onset  . Stroke Mother   . COPD Mother   . Dementia Mother   . Breast cancer Mother   . Diabetes Father   . Heart attack Father   . Diabetes Sister   . Stroke Sister        x2  . Hepatitis C Daughter   . Diabetes Paternal Grandmother     SOCIAL  HISTORY:   Social History   Socioeconomic History  . Marital status: Divorced    Spouse name: Not on file  . Number of children: 1  . Years of education: Not on file  . Highest education level: Not on file  Occupational History  . Occupation: disabled  Social Needs  . Financial resource strain: Not on file  . Food insecurity:    Worry: Not on file    Inability: Not on file  . Transportation needs:    Medical: Not on file    Non-medical: Not on file  Tobacco Use  . Smoking status: Current Every Day Smoker    Packs/day: 0.75    Years: 46.00    Pack years: 34.50    Types: Cigarettes  . Smokeless tobacco: Never Used  . Tobacco comment: Takes 2-3 puffs when smoking, 4 cigarettes a day   Substance and Sexual Activity  . Alcohol use: Yes    Alcohol/week: 1.0 standard drinks    Types: 1 Cans of beer per week    Frequency: Never    Comment: once or twice a year.   . Drug use: No  . Sexual activity: Never    Comment: last partner ex husbanc 8 years ago   Lifestyle  . Physical activity:    Days per week: Not on file    Minutes per session: Not on file  .  Stress: Not on file  Relationships  . Social connections:    Talks on phone: Not on file    Gets together: Not on file    Attends religious service: Not on file    Active member of club or organization: Not on file    Attends meetings of clubs or organizations: Not on file    Relationship status: Not on file  . Intimate partner violence:    Fear of current or ex partner: Not on file    Emotionally abused: Not on file    Physically abused: Not on file    Forced sexual activity: Not on file  Other Topics Concern  . 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 No   Do you have difficulty preparing food or eating? No   Do you have difficulty managing your medications? No   Do you have difficulty managing your finances? No   Do you have difficulty affording your medications? Yes Inhalers     Levert FeinsteinYijun Ranjit Ashurst, M.D. Ph.D.  [facility] [address], [address] [place], [place] [address] Ph: [phone] Fax: [phone]  CC: Ngetich, Donalee Citrininah C, NP

## 2018-08-01 ENCOUNTER — Other Ambulatory Visit: Payer: Self-pay | Admitting: Neurology

## 2018-08-01 DIAGNOSIS — G609 Hereditary and idiopathic neuropathy, unspecified: Secondary | ICD-10-CM

## 2018-08-12 ENCOUNTER — Other Ambulatory Visit: Payer: Self-pay | Admitting: Nurse Practitioner

## 2018-09-05 ENCOUNTER — Telehealth: Payer: Self-pay | Admitting: Neurology

## 2018-09-05 NOTE — Telephone Encounter (Signed)
I called patient to schedule a NCV/EMG and a 3 month follow-up from her 4/21 appt per Dr. Krista Blue. I LVM requesting patient call us back to schedule these appts. Office contact info was provided.

## 2018-09-15 ENCOUNTER — Emergency Department (HOSPITAL_BASED_OUTPATIENT_CLINIC_OR_DEPARTMENT_OTHER)
Admission: EM | Admit: 2018-09-15 | Discharge: 2018-09-15 | Disposition: A | Discharge status: Home / Self Care | Payer: Medicare HMO | Attending: Emergency Medicine | Admitting: Emergency Medicine

## 2018-09-15 ENCOUNTER — Encounter (HOSPITAL_BASED_OUTPATIENT_CLINIC_OR_DEPARTMENT_OTHER): Payer: Self-pay

## 2018-09-15 ENCOUNTER — Emergency Department (HOSPITAL_BASED_OUTPATIENT_CLINIC_OR_DEPARTMENT_OTHER): Payer: Medicare HMO

## 2018-09-15 DIAGNOSIS — Y999 Unspecified external cause status: Secondary | ICD-10-CM | POA: Diagnosis not present

## 2018-09-15 DIAGNOSIS — I10 Essential (primary) hypertension: Secondary | ICD-10-CM | POA: Insufficient documentation

## 2018-09-15 DIAGNOSIS — J449 Chronic obstructive pulmonary disease, unspecified: Secondary | ICD-10-CM | POA: Insufficient documentation

## 2018-09-15 DIAGNOSIS — F17210 Nicotine dependence, cigarettes, uncomplicated: Secondary | ICD-10-CM | POA: Insufficient documentation

## 2018-09-15 DIAGNOSIS — Z8673 Personal history of transient ischemic attack (TIA), and cerebral infarction without residual deficits: Secondary | ICD-10-CM | POA: Insufficient documentation

## 2018-09-15 DIAGNOSIS — Y9301 Activity, walking, marching and hiking: Secondary | ICD-10-CM | POA: Diagnosis not present

## 2018-09-15 DIAGNOSIS — G309 Alzheimer's disease, unspecified: Secondary | ICD-10-CM | POA: Diagnosis not present

## 2018-09-15 DIAGNOSIS — W108XXA Fall (on) (from) other stairs and steps, initial encounter: Secondary | ICD-10-CM | POA: Insufficient documentation

## 2018-09-15 DIAGNOSIS — S92352A Displaced fracture of fifth metatarsal bone, left foot, initial encounter for closed fracture: Secondary | ICD-10-CM | POA: Diagnosis not present

## 2018-09-15 DIAGNOSIS — Y929 Unspecified place or not applicable: Secondary | ICD-10-CM | POA: Diagnosis not present

## 2018-09-15 DIAGNOSIS — Z79899 Other long term (current) drug therapy: Secondary | ICD-10-CM | POA: Diagnosis not present

## 2018-09-15 DIAGNOSIS — S99912A Unspecified injury of left ankle, initial encounter: Secondary | ICD-10-CM | POA: Diagnosis present

## 2018-09-15 MED ORDER — HYDROCODONE-ACETAMINOPHEN 5-325 MG PO TABS: 1.0000 | ORAL_TABLET | ORAL | 0 refills | Status: DC | PRN

## 2018-09-15 NOTE — ED Triage Notes (Signed)
Pt presents via [facility] from extended stay hotel after a fall yesterday on stairs. Unable to bare weight on L ankle. Swelling noted. Tender to touch.

## 2018-09-15 NOTE — ED Notes (Signed)
Per ems- pt is homeless living [place] extended stay and [place] pt with daughter who is at hotel. Neither pt or pt daughter has phone to contact. Address provided from EMS for pt to return.

## 2018-09-15 NOTE — ED Provider Notes (Signed)
[facility] EMERGENCY DEPARTMENT Provider Note   CSN: [id number] Arrival date & time: 09/15/18  1718    History   Chief Complaint Chief Complaint  Patient presents with   Ankle Injury    HPI Maylon Pepperserry Buckhalter is a 63 y.o. female.     Patient is a 63 year old female who presents with foot and ankle pain after a fall.  She was walking down some stairs last night and twisted her ankle on the last step, causing her to fall over.  She has constant pain and swelling to the left foot and ankle since that time.  She denies any other injuries from the fall.  She denies any head injury.  No neck or back pain.  She says she has not been able to walk on her foot since it happened.  She is homeless so been unable to walk is a problem for her.     Past Medical History:  Diagnosis Date   Alzheimer disease (HCC)    Anxiety    Bilateral pneumonia 01/2017   per new patient packet    COPD (chronic obstructive pulmonary disease) (HCC) 01/2017   per new patient packet    Depression    Fatty liver    Gallbladder disease 2014   per new patient packet    Hernia, abdominal    Hypertension    Liver damage 01/2017   per new patient packet, Dr.Kadakia   Normal cardiac stress test 2007   Ovarian cyst 1977   8 1/2 lb left ovarian cyst, Dr.Cox, per new patient packet    Pneumonia    Right ovarian cyst    1992, 1993, 1994, 1995, and 1996 Dr.Neil, per new patient packet    Stroke (HCC)    2 strokes.     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/08/2017   GAD (generalized anxiety disorder) 03/08/2017   Hepatomegaly 03/08/2017   Acute exacerbation of chronic obstructive pulmonary disease (COPD) (HCC) 02/16/2017   Acute respiratory failure with hypoxia (HCC)    COPD with acute exacerbation (HCC) 01/27/2017   CAP (community acquired pneumonia) 01/27/2017   Tachycardia 01/27/2017   COPD exacerbation (HCC) 01/27/2017   Hypokalemia 01/27/2017   Left arm numbness 11/28/2016    Class: Chronic   Nondisplaced transverse fracture of left patella, initial encounter for closed fracture 06/10/2016   Malnutrition of moderate degree (HCC) 07/12/2013   Syncope 07/11/2013   GASTROENTERITIS 05/07/2010   TOBACCO ABUSE 07/01/2009   Benign essential HTN 06/24/2009   BRONCHITIS, ACUTE 06/24/2009    Past Surgical History:  Procedure Laterality Date   ABDOMINAL HYSTERECTOMY Bilateral    Total   CESAREAN SECTION     CHOLECYSTECTOMY     INGUINAL HERNIA REPAIR Right    LEFT OOPHORECTOMY  1977   with cyst removal, age 63   MINOR EXCISION OF ORAL LESION N/A 10/07/2017   Procedure: IRRIGATION AND DEBRIDEMENT OF ORAL INFECTION, REMOVAL OF REMAINING 8  TEETH AND PLACEMENT OF PENROSE DRAINS;  Surgeon: Vivia Ewingrab, Justin, DMD;  Location: [facility];  Service: Oral Surgery;  Laterality: N/A;   OVARIAN CYST REMOVAL Right 93, 94,95, 96   UMBILICAL HERNIA REPAIR       OB History   No obstetric history on file.      Home Medications    Prior to Admission medications   Medication Sig Start Date End Date Taking? Authorizing Provider  albuterol (PROVENTIL HFA;VENTOLIN HFA) 108 (90 Base) MCG/ACT inhaler Inhale 2 puffs into the lungs every 6 (six) hours as needed for wheezing or shortness of breath.    [provider]  alendronate (FOSAMAX) 70 MG  tablet TAKE 1 TABLET BY MOUTH WEEKLY WITH FULL GLASS OF WATER ON EMPTY STOMACH 30 MIN PRIOR TO BREAKFAST 08/14/18   Ngetich, Dinah C, NP  budesonide-formoterol (SYMBICORT) 160-4.5 MCG/ACT inhaler Inhale 2 puffs into the lungs as needed.     [provider]  citalopram (CELEXA) 40 MG tablet Take 0.5 tablets (20 mg total) by mouth daily for 30 days. 05/04/18 06/03/18  Ngetich, Dinah C, NP  gabapentin (NEURONTIN) 300 MG capsule TAKE 1 CAPSULE (300 MG TOTAL) BY MOUTH 4 (FOUR) TIMES DAILY. FOR NERVE PAIN 08/02/18   Levert FeinsteinYan, Yijun, MD  HYDROcodone-acetaminophen (NORCO/VICODIN) 5-325 MG tablet Take 1-2 tablets by mouth every 4 (four) hours as needed. 09/15/18   Rolan BuccoBelfi, Javonn Gauger, MD  KLOR-CON M10 10 MEQ tablet Take 10 mEq by mouth once a week. On Wednesday 08/12/17   [provider]  losartan (COZAAR) 50 MG tablet Take 1 tablet (50 mg total) daily by mouth. 02/02/17   Vassie LollMadera, Carlos, MD  nitroGLYCERIN (NITROSTAT) 0.4 MG SL tablet Place 1 tablet (0.4 mg total) under the tongue every 5 (five) minutes x 3 doses as needed for chest pain (chest pain). 02/18/17   Orpah CobbKadakia, Ajay, MD  ondansetron (ZOFRAN-ODT) 4 MG disintegrating tablet Take 4 mg by mouth daily as needed. 08/09/17   [provider]  rOPINIRole (REQUIP) 0.25 MG tablet TAKE 1 TABLET (0.25 MG TOTAL) BY MOUTH AT BEDTIME. 06/07/18   Ngetich, Donalee Citrininah C, NP    Family History Family History  Problem Relation Age of Onset   Stroke Mother    COPD Mother    Dementia Mother    Breast cancer Mother    Diabetes Father    Heart attack Father    Diabetes Sister    Stroke Sister        x2   Hepatitis C Daughter    Diabetes Paternal Grandmother     Social History Social History   Tobacco Use   Smoking status: Current Every Day Smoker    Packs/day: 0.75    Years: 46.00    Pack years: 34.50    Types: Cigarettes   Smokeless tobacco: Never Used   Tobacco comment: Takes 2-3 puffs when smoking, 4 cigarettes a day   Substance Use  Topics   Alcohol use: Yes    Alcohol/week: 1.0 standard drinks    Types: 1 Cans of beer per week    Frequency: Never    Comment: once or twice a year.    Drug use: No     Allergies   Amoxicillin, Clindamycin/lincomycin, Sulfonamide derivatives, and Wellbutrin [bupropion]   Review of Systems Review of Systems  Constitutional: Negative for fever.  Gastrointestinal: Negative for nausea and vomiting.  Musculoskeletal: Positive for arthralgias and joint swelling. Negative for back pain and neck pain.  Skin: Negative for wound.  Neurological: Negative  for weakness, numbness and headaches.     Physical Exam Updated Vital Signs BP (!) 186/102 (BP Location: Right Arm)    Pulse 94    Temp 98.6 F (37 C) (Oral)    Resp 18    Ht 5\' 2"  (1.575 m)    Wt 56.7 kg    SpO2 99%    BMI 22.86 kg/m   Physical Exam Constitutional:      Appearance: She is well-developed.  HENT:     Head: Normocephalic and atraumatic.  Neck:     Musculoskeletal: Normal range of motion and neck supple.  Cardiovascular:     Rate and Rhythm: Normal rate.  Pulmonary:     Effort: Pulmonary effort is normal.  Musculoskeletal:        General: Swelling and tenderness present.     Comments: Patient has diffuse swelling of the left foot and ankle.  There is some ecchymosis to the dorsum of the left foot.  There is generalized tenderness to the dorsum of the foot and over the lateral malleolus of the left ankle.  There is also some tenderness on palpation of the lateral knee and proximal fibula.  There is no swelling of the knee.  No open wounds are noted.  She has normal sensation and motor function distally.  Pedal pulses are intact.  No pain to the hip.  No pain to the other joints.  Skin:    General: Skin is warm and dry.  Neurological:     Mental Status: She is alert and oriented to person, place, and time.      ED Treatments / Results  Labs (all labs ordered are listed, but only abnormal results are  displayed) Labs Reviewed - No data to display  EKG None  Radiology Dg Ankle Complete Left  Result Date: 09/15/2018 CLINICAL DATA:  Fall, ankle and foot pain EXAM: LEFT FOOT - COMPLETE 3+ VIEW; LEFT ANKLE COMPLETE - 3+ VIEW; LEFT KNEE - COMPLETE 4+ VIEW COMPARISON:  None. FINDINGS: No fracture or dislocation of the left knee. Joint spaces are well preserved. No knee joint effusion. No fracture or dislocation of the left ankle. The ankle mortise is preserved. Soft tissue edema about the lateral ankle. There is an oblique, minimally displaced fracture of the mid left fifth metacarpal with overlying soft tissue edema. No other fracture or dislocation of the left foot. Joint spaces are well preserved. IMPRESSION: 1. No fracture or dislocation of the left knee. Joint spaces are well preserved. No knee joint effusion. 2. No fracture or dislocation of the left ankle. The ankle mortise is preserved. Soft tissue edema about the lateral ankle. 3. There is an oblique, minimally displaced fracture of the mid left fifth metacarpal with overlying soft tissue edema. No other fracture or dislocation of the left foot. Joint spaces are well preserved. Electronically Signed   By: Lauralyn PrimesAlex  Bibbey M.D.   On: 09/15/2018 17:52   Dg Knee Complete 4 Views Left  Result Date: 09/15/2018 CLINICAL DATA:  Fall, ankle and foot pain EXAM: LEFT FOOT - COMPLETE 3+ VIEW; LEFT ANKLE COMPLETE - 3+ VIEW; LEFT KNEE - COMPLETE 4+ VIEW COMPARISON:  None. FINDINGS: No fracture or dislocation of the left knee. Joint spaces are well preserved. No knee joint effusion. No fracture or dislocation of the left ankle. The ankle mortise is preserved. Soft tissue edema about the lateral ankle. There is an oblique, minimally displaced fracture of the mid left fifth metacarpal with overlying soft tissue edema. No  other fracture or dislocation of the left foot. Joint spaces are well preserved. IMPRESSION: 1. No fracture or dislocation of the left knee. Joint  spaces are well preserved. No knee joint effusion. 2. No fracture or dislocation of the left ankle. The ankle mortise is preserved. Soft tissue edema about the lateral ankle. 3. There is an oblique, minimally displaced fracture of the mid left fifth metacarpal with overlying soft tissue edema. No other fracture or dislocation of the left foot. Joint spaces are well preserved. Electronically Signed   By: Eddie Candle M.D.   On: 09/15/2018 17:52   Dg Foot Complete Left  Result Date: 09/15/2018 CLINICAL DATA:  Fall, ankle and foot pain EXAM: LEFT FOOT - COMPLETE 3+ VIEW; LEFT ANKLE COMPLETE - 3+ VIEW; LEFT KNEE - COMPLETE 4+ VIEW COMPARISON:  None. FINDINGS: No fracture or dislocation of the left knee. Joint spaces are well preserved. No knee joint effusion. No fracture or dislocation of the left ankle. The ankle mortise is preserved. Soft tissue edema about the lateral ankle. There is an oblique, minimally displaced fracture of the mid left fifth metacarpal with overlying soft tissue edema. No other fracture or dislocation of the left foot. Joint spaces are well preserved. IMPRESSION: 1. No fracture or dislocation of the left knee. Joint spaces are well preserved. No knee joint effusion. 2. No fracture or dislocation of the left ankle. The ankle mortise is preserved. Soft tissue edema about the lateral ankle. 3. There is an oblique, minimally displaced fracture of the mid left fifth metacarpal with overlying soft tissue edema. No other fracture or dislocation of the left foot. Joint spaces are well preserved. Electronically Signed   By: Eddie Candle M.D.   On: 09/15/2018 17:52    Procedures Procedures (including critical care time)  Medications Ordered in ED Medications - No data to display   Initial Impression / Assessment and Plan / ED Course  I have reviewed the triage vital signs and the nursing notes.  Pertinent labs & imaging results that were available during my care of the patient were  reviewed by me and considered in my medical decision making (see chart for details).       Patient presents after a fall.  She has evidence of 1/5 metatarsal fracture which is minimally displaced.  Her compartments are soft.  She is neurologically intact.  I spoke with the orthopedist on-call, Dr. Lucia Gaskins who does advised that it would be okay to place the patient in a walking boot.  It is okay for her to put weight on it.  She was given crutches.  She was advised in ice and elevation.  She was given a prescription for a small amount of Vicodin.  She was given a referral to follow-up with Dr. Lucia Gaskins.  Return precautions were given.  Final Clinical Impressions(s) / ED Diagnoses   Final diagnoses:  Closed displaced fracture of fifth metatarsal bone of left foot, initial encounter    ED Discharge Orders         Ordered    HYDROcodone-acetaminophen (NORCO/VICODIN) 5-325 MG tablet  Every 4 hours PRN     09/15/18 1831           Malvin Johns, MD 09/15/18 [phone]

## 2018-10-20 ENCOUNTER — Emergency Department (HOSPITAL_BASED_OUTPATIENT_CLINIC_OR_DEPARTMENT_OTHER): Payer: Medicare HMO

## 2018-10-20 ENCOUNTER — Encounter (HOSPITAL_BASED_OUTPATIENT_CLINIC_OR_DEPARTMENT_OTHER): Payer: Self-pay | Admitting: *Deleted

## 2018-10-20 ENCOUNTER — Emergency Department (HOSPITAL_BASED_OUTPATIENT_CLINIC_OR_DEPARTMENT_OTHER)
Admission: EM | Admit: 2018-10-20 | Discharge: 2018-10-21 | Disposition: A | Discharge status: Home / Self Care | Payer: Medicare HMO | Attending: Emergency Medicine | Admitting: Emergency Medicine

## 2018-10-20 ENCOUNTER — Other Ambulatory Visit: Payer: Self-pay

## 2018-10-20 DIAGNOSIS — Y929 Unspecified place or not applicable: Secondary | ICD-10-CM | POA: Diagnosis not present

## 2018-10-20 DIAGNOSIS — I10 Essential (primary) hypertension: Secondary | ICD-10-CM | POA: Insufficient documentation

## 2018-10-20 DIAGNOSIS — W06XXXA Fall from bed, initial encounter: Secondary | ICD-10-CM | POA: Insufficient documentation

## 2018-10-20 DIAGNOSIS — S93401A Sprain of unspecified ligament of right ankle, initial encounter: Secondary | ICD-10-CM

## 2018-10-20 DIAGNOSIS — Y939 Activity, unspecified: Secondary | ICD-10-CM | POA: Diagnosis not present

## 2018-10-20 DIAGNOSIS — Z8673 Personal history of transient ischemic attack (TIA), and cerebral infarction without residual deficits: Secondary | ICD-10-CM | POA: Diagnosis not present

## 2018-10-20 DIAGNOSIS — F17210 Nicotine dependence, cigarettes, uncomplicated: Secondary | ICD-10-CM | POA: Diagnosis not present

## 2018-10-20 DIAGNOSIS — Y999 Unspecified external cause status: Secondary | ICD-10-CM | POA: Diagnosis not present

## 2018-10-20 DIAGNOSIS — Z79899 Other long term (current) drug therapy: Secondary | ICD-10-CM | POA: Insufficient documentation

## 2018-10-20 DIAGNOSIS — J449 Chronic obstructive pulmonary disease, unspecified: Secondary | ICD-10-CM | POA: Insufficient documentation

## 2018-10-20 DIAGNOSIS — S99911A Unspecified injury of right ankle, initial encounter: Secondary | ICD-10-CM | POA: Diagnosis present

## 2018-10-20 MED ORDER — HYDROCODONE-ACETAMINOPHEN 5-325 MG PO TABS
2.0000 | ORAL_TABLET | Freq: Once | ORAL | Status: AC
  Administered 2018-10-20: 2 via ORAL
  Filled 2018-10-20: qty 2

## 2018-10-20 MED ORDER — IBUPROFEN 400 MG PO TABS
400.0000 mg | ORAL_TABLET | Freq: Once | ORAL | Status: AC
  Administered 2018-10-20: 400 mg via ORAL
  Filled 2018-10-20: qty 1

## 2018-10-20 NOTE — ED Notes (Signed)
Pt states she already has crutches at home and doesn't need any. RN made aware.

## 2018-10-20 NOTE — ED Notes (Signed)
Pt on the phone attempting to get a ride

## 2018-10-20 NOTE — ED Triage Notes (Signed)
Pt arrived via [facility] for c/o right ankle pain. States she was getting out of bed and her right ankle pain "gave away" causing her to slide down. Denies and injury other than her right ankle. Pt currently has a boot to her left ankle from a previous ankle fracture. Denies hitting her head. Right ankle is swollen.

## 2018-10-20 NOTE — ED Notes (Signed)
Pt in room awaiting a ride to arrive.

## 2018-10-20 NOTE — ED Provider Notes (Signed)
[facility] EMERGENCY DEPARTMENT Provider Note   CSN: [id number] Arrival date & time: 10/20/18  2030    History   Chief Complaint Chief Complaint  Patient presents with  . ankle pain    HPI Brittany [name] is a 63 y.o. female.     HPI  63 year old female presents with right ankle injury.  Around 2 PM she fell try to get out of bed.  She states her right ankle seem to give away.  She is having severe pain and swelling.  Foot also feels hard to move.  No numbness.  Did not hit her head or suffer any other injuries.  Past Medical History:  Diagnosis Date  . Alzheimer disease (HCC)   . Anxiety   . Bilateral pneumonia 01/2017   per new patient packet   . COPD (chronic obstructive pulmonary disease) (HCC) 01/2017   per new patient packet   . Depression   . Fatty liver   . Gallbladder disease 2014   per new patient packet   . Hernia, abdominal   . Hypertension   . Liver damage 01/2017   per new patient packet, Dr.Kadakia  . Normal cardiac stress test 2007  . Ovarian cyst 1977   8 1/2 lb left ovarian cyst, Dr.Cox, per new patient packet   . Pneumonia   . Right ovarian cyst    1992, 1993, 1994, 1995, and 1996 Dr.Neil, per new patient packet   . Stroke [facility](HCC)    2 strokes.     Patient Active Problem List   Diagnosis Date Noted  . Restless leg syndrome 07/10/2018  . Peripheral neuropathy 07/10/2018  . COPD (chronic obstructive pulmonary disease) (HCC) 10/05/2017  . GERD (gastroesophageal reflux disease) 10/05/2017  . Depression 10/05/2017  . Facial cellulitis-left 10/05/2017  . Diarrhea 10/05/2017  . Chronic viral hepatitis C (HCC) 06/20/2017  . Idiopathic peripheral neuropathy 05/19/2017  . Chronic low back pain without sciatica 05/19/2017  . Hyperglycemia 05/19/2017  . Chronic bronchitis (HCC) 03/08/2017  . Elevated LFTs 03/08/2017  . 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 GAD (generalized  anxiety disorder) 03/08/2017  . Hepatomegaly 03/08/2017  . Acute exacerbation of chronic obstructive pulmonary disease (COPD) (HCC) 02/16/2017  . Acute respiratory failure with hypoxia (HCC)   . COPD with acute exacerbation (HCC) 01/27/2017  . CAP (community acquired pneumonia) 01/27/2017  . Tachycardia 01/27/2017  . COPD exacerbation (HCC) 01/27/2017  . Hypokalemia 01/27/2017  . Left arm numbness 11/28/2016    Class: Chronic  . Nondisplaced transverse fracture of left patella, initial encounter for closed fracture 06/10/2016  . Malnutrition of moderate degree (HCC) 07/12/2013  . Syncope 07/11/2013  . GASTROENTERITIS 05/07/2010  . TOBACCO ABUSE 07/01/2009  . Benign essential HTN 06/24/2009  . BRONCHITIS, ACUTE 06/24/2009    Past Surgical History:  Procedure Laterality Date  . ABDOMINAL HYSTERECTOMY Bilateral    Total  . CESAREAN SECTION    . CHOLECYSTECTOMY    . INGUINAL HERNIA REPAIR Right   . LEFT OOPHORECTOMY  1977   with cyst removal, age 89  . MINOR EXCISION OF ORAL LESION N/A 10/07/2017   Procedure: IRRIGATION AND DEBRIDEMENT OF ORAL INFECTION, REMOVAL OF REMAINING 8 TEETH AND PLACEMENT OF PENROSE DRAINS;  Surgeon: Vivia Ewingrab, Justin, DMD;  Location: [facility];  Service: Oral Surgery;  Laterality: N/A;  . OVARIAN CYST REMOVAL Right 93, 94,95, 96  . UMBILICAL HERNIA REPAIR       OB History   No  obstetric history on file.      Home Medications    Prior to Admission medications   Medication Sig Start Date End Date Taking? Authorizing Provider  albuterol (PROVENTIL HFA;VENTOLIN HFA) 108 (90 Base) MCG/ACT inhaler Inhale 2 puffs into the lungs every 6 (six) hours as needed for wheezing or shortness of breath.    [provider]  alendronate (FOSAMAX) 70 MG tablet TAKE 1 TABLET BY MOUTH WEEKLY WITH FULL GLASS OF WATER ON EMPTY STOMACH 30 MIN PRIOR TO BREAKFAST 08/14/18   Ngetich, Dinah C, NP  budesonide-formoterol (SYMBICORT) 160-4.5 MCG/ACT inhaler Inhale 2 puffs into the  lungs as needed.     [provider]  citalopram (CELEXA) 40 MG tablet Take 0.5 tablets (20 mg total) by mouth daily for 30 days. 05/04/18 06/03/18  Ngetich, Dinah C, NP  gabapentin (NEURONTIN) 300 MG capsule TAKE 1 CAPSULE (300 MG TOTAL) BY MOUTH 4 (FOUR) TIMES DAILY. FOR NERVE PAIN 08/02/18   Levert FeinsteinYan, Yijun, MD  HYDROcodone-acetaminophen (NORCO/VICODIN) 5-325 MG tablet Take 1-2 tablets by mouth every 4 (four) hours as needed. 09/15/18   Rolan BuccoBelfi, Melanie, MD  KLOR-CON M10 10 MEQ tablet Take 10 mEq by mouth once a week. On Wednesday 08/12/17   [provider]  losartan (COZAAR) 50 MG tablet Take 1 tablet (50 mg total) daily by mouth. 02/02/17   Vassie LollMadera, Carlos, MD  nitroGLYCERIN (NITROSTAT) 0.4 MG SL tablet Place 1 tablet (0.4 mg total) under the tongue every 5 (five) minutes x 3 doses as needed for chest pain (chest pain). 02/18/17   Orpah CobbKadakia, Ajay, MD  ondansetron (ZOFRAN-ODT) 4 MG disintegrating tablet Take 4 mg by mouth daily as needed. 08/09/17   [provider]  rOPINIRole (REQUIP) 0.25 MG tablet TAKE 1 TABLET (0.25 MG TOTAL) BY MOUTH AT BEDTIME. 06/07/18   Ngetich, Donalee Citrininah C, NP    Family History Family History  Problem Relation Age of Onset  . Stroke Mother   . COPD Mother   . Dementia Mother   . Breast cancer Mother   . Diabetes Father   . Heart attack Father   . Diabetes Sister   . Stroke Sister        x2  . Hepatitis C Daughter   . Diabetes Paternal Grandmother     Social History Social History   Tobacco Use  . Smoking status: Current Every Day Smoker    Packs/day: 0.75    Years: 46.00    Pack years: 34.50    Types: Cigarettes  . Smokeless tobacco: Never Used  . Tobacco comment: Takes 2-3 puffs when smoking, 4 cigarettes a day   Substance Use Topics  . Alcohol use: Yes    Alcohol/week: 1.0 standard drinks    Types: 1 Cans of beer per week    Frequency: Never    Comment: once or twice a year.   . Drug use: No     Allergies   Amoxicillin,  Clindamycin/lincomycin, Sulfonamide derivatives, and Wellbutrin [bupropion]   Review of Systems Review of Systems  Musculoskeletal: Positive for arthralgias and joint swelling.  Neurological: Negative for numbness.     Physical Exam Updated Vital Signs BP (!) 156/106   Pulse 90   Temp 98.6 F (37 C) (Oral)   Resp 18   Ht 5\' 2"  (1.575 m)   Wt 54.4 kg   SpO2 98%   BMI 21.95 kg/m   Physical Exam Vitals signs and nursing note reviewed.  Constitutional:      Appearance: She is  well-developed.  HENT:     Head: Normocephalic and atraumatic.     Right Ear: External ear normal.     Left Ear: External ear normal.     Nose: Nose normal.  Eyes:     General:        Right eye: No discharge.        Left eye: No discharge.  Cardiovascular:     Rate and Rhythm: Normal rate and regular rhythm.     Pulses:          Dorsalis pedis pulses are 2+ on the right side.  Pulmonary:     Effort: Pulmonary effort is normal.  Abdominal:     General: There is no distension.  Musculoskeletal:     Right knee: She exhibits normal range of motion and no swelling. No tenderness found.     Right ankle: She exhibits decreased range of motion, swelling and ecchymosis. She exhibits no deformity. Tenderness. Lateral malleolus tenderness found. Achilles tendon exhibits pain. Achilles tendon exhibits no defect.     Right lower leg: She exhibits no tenderness.     Right foot: Tenderness and swelling (proximal) present. No deformity.     Comments: Left foot is in a cam walker  Skin:    General: Skin is warm and dry.  Neurological:     Mental Status: She is alert.  Psychiatric:        Mood and Affect: Mood is not anxious.      ED Treatments / Results  Labs (all labs ordered are listed, but only abnormal results are displayed) Labs Reviewed - No data to display  EKG None  Radiology Dg Ankle Complete Right  Result Date: 10/20/2018 CLINICAL DATA:  Pain following fall EXAM: RIGHT ANKLE - COMPLETE  3+ VIEW COMPARISON:  None. FINDINGS: Frontal, oblique, and lateral views were obtained. There is marked soft tissue swelling laterally. There is no demonstrable fracture or joint effusion. There is no appreciable joint space narrowing or erosion. The ankle mortise appears intact. IMPRESSION: Marked soft tissue swelling laterally. No fracture evident. No appreciable arthropathy. Ankle mortise appears intact. Electronically Signed   By: Bretta BangWilliam  Woodruff III M.D.   On: 10/20/2018 21:46   Dg Foot Complete Right  Result Date: 10/20/2018 CLINICAL DATA:  Pain following fall EXAM: RIGHT FOOT COMPLETE - 3+ VIEW COMPARISON:  None. FINDINGS: Frontal, oblique, and lateral views were obtained. There is no fracture or dislocation. Joint spaces appear normal. No erosive change. IMPRESSION: No fracture or dislocation.  No evident arthropathy. Electronically Signed   By: Bretta BangWilliam  Woodruff III M.D.   On: 10/20/2018 21:47    Procedures Procedures (including critical care time)  Medications Ordered in ED Medications  ibuprofen (ADVIL) tablet 400 mg (has no administration in time range)  HYDROcodone-acetaminophen (NORCO/VICODIN) 5-325 MG per tablet 2 tablet (2 tablets Oral Given 10/20/18 2146)     Initial Impression / Assessment and Plan / ED Course  I have reviewed the triage vital signs and the nursing notes.  Pertinent labs & imaging results that were available during my care of the patient were reviewed by me and considered in my medical decision making (see chart for details).        X-rays are reassuring with no fractures or dislocations.  Likely sprain.  Thompson's test is okay and so I have pretty low suspicion of an acute Achilles injury.  Will Ace wrap her ankle and give her crutches.  NSAIDs for pain.  Final Clinical Impressions(s) /  ED Diagnoses   Final diagnoses:  Sprain of right ankle, unspecified ligament, initial encounter    ED Discharge Orders    None       Sherwood Gambler, MD  10/20/18 [phone]

## 2018-10-20 NOTE — ED Notes (Signed)
RN went into room to medicate patient  She is still talking on the phone

## 2018-10-20 NOTE — ED Notes (Signed)
PMS intact before and after. Pt tolerated well. All questions answered. 

## 2018-10-20 NOTE — ED Notes (Addendum)
Pt understood dc material. NAD noted. All questions answered at satisfaction.

## 2018-10-22 ENCOUNTER — Telehealth: Payer: Self-pay | Admitting: *Deleted

## 2018-10-22 NOTE — Telephone Encounter (Signed)
-----   Message from Sandrea Hughs, NP sent at 10/22/2018 10:08 AM EDT ----- Regarding: ED follow up Good morning Rodena Piety, Ms.Crocket went to ED for right ankle sprain.Looks like she was told to follow up with PCP.Please contact patient to see whether she needs follow up. Thanks  Dinah Ngetich FNP-C

## 2018-10-22 NOTE — Telephone Encounter (Signed)
LMOM for patient to return call.

## 2018-10-29 ENCOUNTER — Telehealth: Payer: Self-pay | Admitting: Acute Care

## 2018-10-29 NOTE — Telephone Encounter (Signed)
LMTC x 1 for pt.  Calling to see if pt is agreeable to get scheduled for yearly low dose chest CT.

## 2018-10-30 NOTE — Telephone Encounter (Signed)
Okay.Patient need to get evaluation in ED as soon as possible if toes are turning black.

## 2018-10-30 NOTE — Telephone Encounter (Signed)
Patient stated that she is going today to the ER.

## 2018-10-30 NOTE — Telephone Encounter (Signed)
Spoke with patient and she stated that she does not have transportation to our office. Stated that she is just going to go back to the ER because now her toes have turned black (yesterday) and the ankle is still painful and she has developed blisters. I offered an appointment for today here but she refused.

## 2018-11-13 NOTE — Telephone Encounter (Signed)
LMOM for pt.  Since we have not heard back from pt, we will let her PCP know that we are no longer following pt for lung cancer screening and that if she changes her mind that she can let her PCP know to send [place] a new referral.

## 2018-12-01 IMAGING — DX DG KNEE COMPLETE 4+V*L*
4 series · 4 of 4 positions shown · non-contrast
Comparison: None.

CLINICAL DATA: Fall today.  Left knee pain.

EXAM:
LEFT KNEE - COMPLETE 4+ VIEW

[t knee ap left]
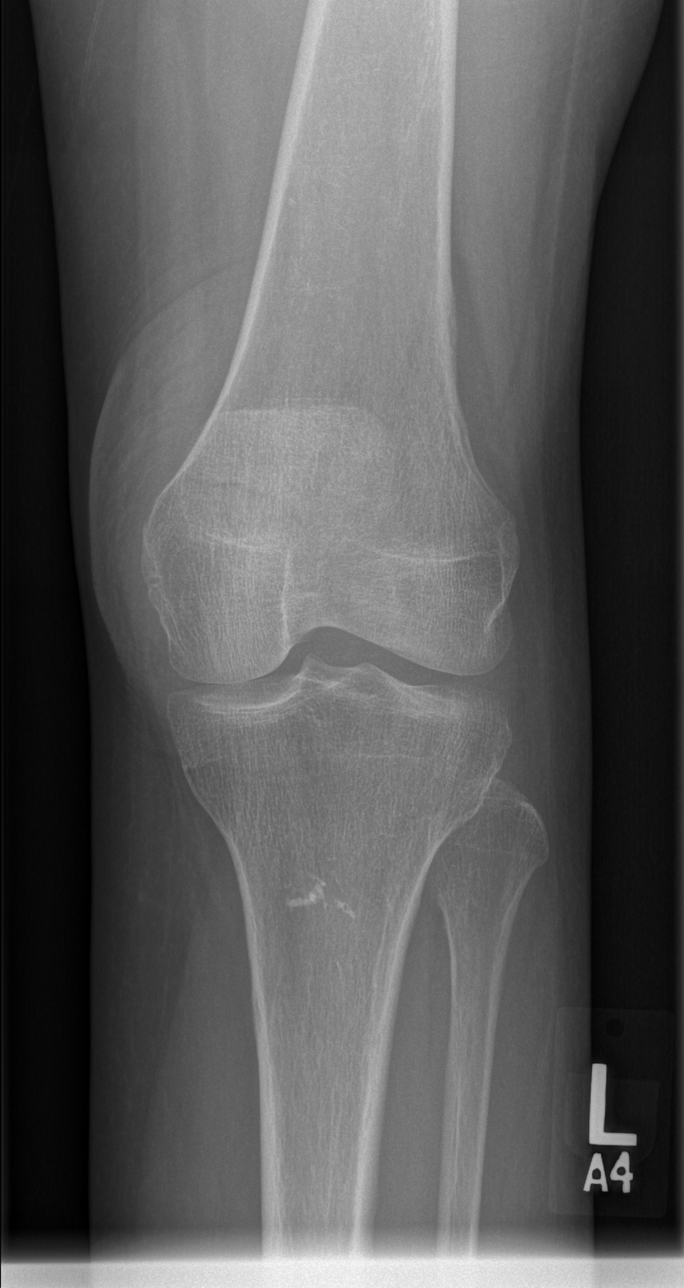

[t knee obl left]
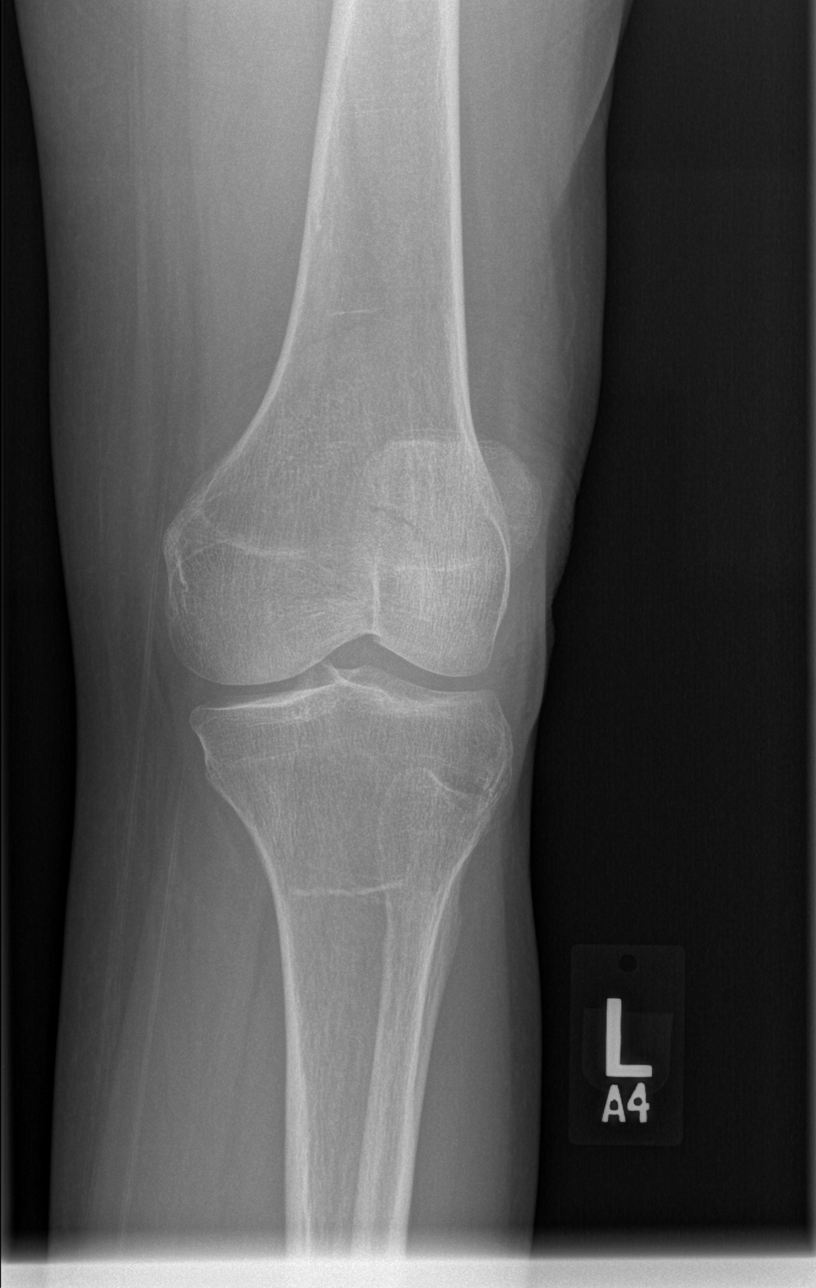

[t knee lat left]
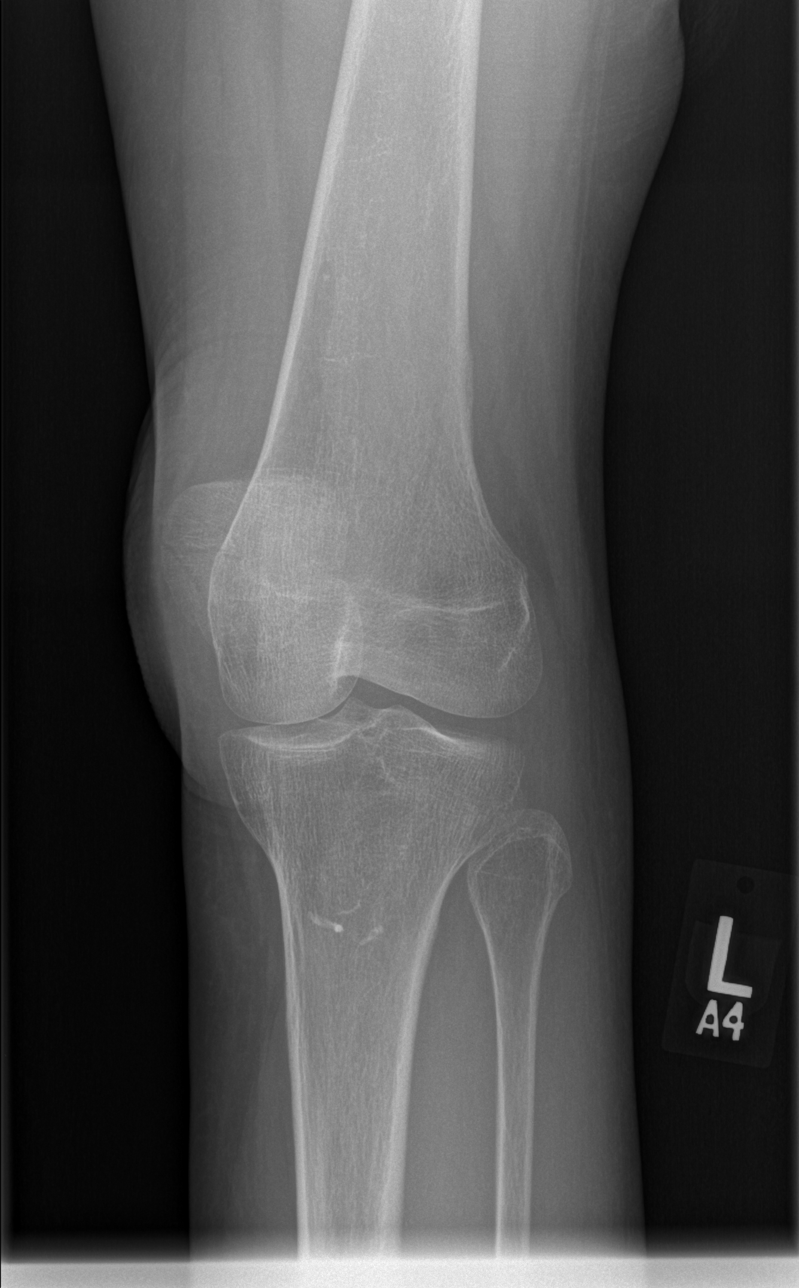

[t knee tunnel left]
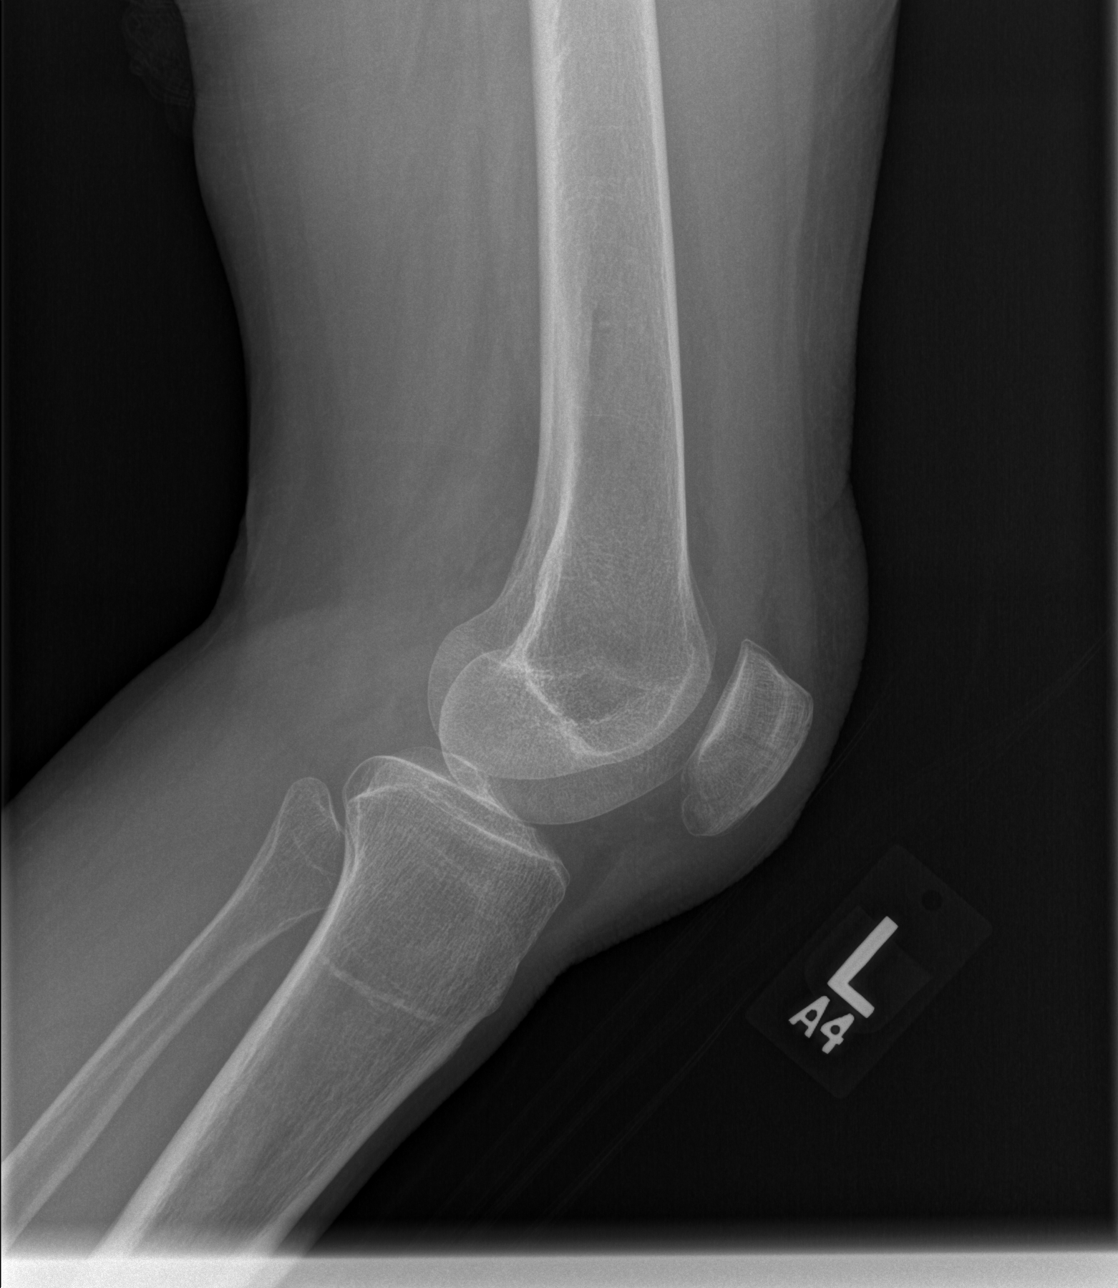

[4 of 4 positions shown; findings below may reference images not displayed]

FINDINGS: There is a nondisplaced fracture through the patella. Overlying soft
tissue swelling. Small joint effusion. No additional acute bony
abnormality.
IMPRESSION: Nondisplaced midpole patellar fracture.  Small joint effusion.

## 2018-12-16 ENCOUNTER — Other Ambulatory Visit: Payer: Self-pay

## 2018-12-16 ENCOUNTER — Emergency Department (HOSPITAL_BASED_OUTPATIENT_CLINIC_OR_DEPARTMENT_OTHER)
Admission: EM | Admit: 2018-12-16 | Discharge: 2018-12-16 | Disposition: A | Discharge status: Home / Self Care | Payer: Medicare HMO | Attending: Emergency Medicine | Admitting: Emergency Medicine

## 2018-12-16 ENCOUNTER — Encounter (HOSPITAL_BASED_OUTPATIENT_CLINIC_OR_DEPARTMENT_OTHER): Payer: Self-pay | Admitting: Emergency Medicine

## 2018-12-16 DIAGNOSIS — I10 Essential (primary) hypertension: Secondary | ICD-10-CM | POA: Diagnosis not present

## 2018-12-16 DIAGNOSIS — M25571 Pain in right ankle and joints of right foot: Secondary | ICD-10-CM | POA: Diagnosis present

## 2018-12-16 DIAGNOSIS — Z79899 Other long term (current) drug therapy: Secondary | ICD-10-CM | POA: Diagnosis not present

## 2018-12-16 DIAGNOSIS — G629 Polyneuropathy, unspecified: Secondary | ICD-10-CM | POA: Insufficient documentation

## 2018-12-16 DIAGNOSIS — F17210 Nicotine dependence, cigarettes, uncomplicated: Secondary | ICD-10-CM | POA: Insufficient documentation

## 2018-12-16 DIAGNOSIS — J449 Chronic obstructive pulmonary disease, unspecified: Secondary | ICD-10-CM | POA: Insufficient documentation

## 2018-12-16 DIAGNOSIS — M792 Neuralgia and neuritis, unspecified: Secondary | ICD-10-CM

## 2018-12-16 MED ORDER — OXYCODONE-ACETAMINOPHEN 5-325 MG PO TABS
1.0000 | ORAL_TABLET | Freq: Once | ORAL | Status: AC
  Administered 2018-12-16: 1 via ORAL
  Filled 2018-12-16: qty 1

## 2018-12-16 NOTE — ED Provider Notes (Signed)
[facility] EMERGENCY DEPARTMENT Provider Note   CSN: [id number] Arrival date & time: 12/16/18  1721     History   Chief Complaint Chief Complaint  Patient presents with   Foot Pain    HPI Brittany [name] is a 63 y.o. female.     63 y/o female with a PMH of ALZ, COPD, Depression, Strokes, peripheral neuropathy presents to the ED with complaints of right ankle pain and numbness x1 month.  Patient reports she had a ankle sprain about a month ago, feels like the injury did not heal properly, she reports she has not followed up with anyone.  She has been placing her right ankle on an Ace wrap, has been elevating and applying ice and heat, she is also been taking medication to help with pain without improvement.  She reports a feeling of pins-and-needles to her right leg worst throughout the day.  Patient is currently on gabapentin to help with her peripheral neuropathy however reports the pain has gotten severe in nature.  She is currently living at her sister's utility building, states she is homeless and has to walk most of the times.  She also endorses some blisters that have been appearing at the bottom of her foot, states she is got decreased strength of her right foot since.  No other trauma, fevers, weakness of other extremities.  The history is provided by the patient.  Foot Pain Pertinent negatives include no chest pain, no abdominal pain and no shortness of breath.    Past Medical History:  Diagnosis Date   Alzheimer disease (HCC)    Anxiety    Bilateral pneumonia 01/2017   per new patient packet    COPD (chronic obstructive pulmonary disease) (HCC) 01/2017   per new patient packet    Depression    Fatty liver    Gallbladder disease 2014   per new patient packet    Hernia, abdominal    Hypertension    Liver damage 01/2017   per new patient packet, Dr.Kadakia   Normal cardiac stress test 2007   Ovarian cyst 1977   8 1/2 lb left ovarian cyst,  Dr.Cox, per new patient packet    Pneumonia    Right ovarian cyst    1992, 1993, 1994, 1995, and 1996 Dr.Neil, per new patient packet    Stroke (HCC)    2 strokes.     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/08/2017   GAD (generalized anxiety disorder) 03/08/2017   Hepatomegaly 03/08/2017   Acute exacerbation of chronic obstructive pulmonary disease (COPD) (HCC) 02/16/2017   Acute respiratory failure with hypoxia (HCC)    COPD with acute exacerbation (HCC) 01/27/2017   CAP (community acquired pneumonia) 01/27/2017   Tachycardia 01/27/2017   COPD exacerbation (HCC) 01/27/2017   Hypokalemia 01/27/2017   Left arm numbness 11/28/2016    Class: Chronic   Nondisplaced transverse fracture of left patella, initial encounter for closed fracture 06/10/2016  Malnutrition of moderate degree (HCC) 07/12/2013   Syncope 07/11/2013   GASTROENTERITIS 05/07/2010   TOBACCO ABUSE 07/01/2009   Benign essential HTN 06/24/2009   BRONCHITIS, ACUTE 06/24/2009    Past Surgical History:  Procedure Laterality Date   ABDOMINAL HYSTERECTOMY Bilateral    Total   CESAREAN SECTION     CHOLECYSTECTOMY     INGUINAL HERNIA REPAIR Right    LEFT OOPHORECTOMY  1977   with cyst removal, age 63   MINOR EXCISION OF ORAL LESION N/A 10/07/2017   Procedure: IRRIGATION AND  DEBRIDEMENT OF ORAL INFECTION, REMOVAL OF REMAINING 8 TEETH AND PLACEMENT OF PENROSE DRAINS;  Surgeon: Vivia Ewingrab, Justin, DMD;  Location: [facility];  Service: Oral Surgery;  Laterality: N/A;   OVARIAN CYST REMOVAL Right 93, 94,95, 96   UMBILICAL HERNIA REPAIR       OB History   No obstetric history on file.      Home Medications    Prior to Admission medications   Medication Sig Start Date End Date Taking? Authorizing Provider  albuterol (PROVENTIL HFA;VENTOLIN HFA) 108 (90 Base) MCG/ACT inhaler Inhale 2 puffs into the lungs every 6 (six) hours as needed for wheezing or shortness of breath.    [provider]  alendronate (FOSAMAX) 70 MG tablet TAKE 1 TABLET BY MOUTH WEEKLY WITH FULL GLASS OF WATER ON EMPTY STOMACH 30 MIN PRIOR TO BREAKFAST 08/14/18   Ngetich, Dinah C, NP  budesonide-formoterol (SYMBICORT) 160-4.5 MCG/ACT inhaler Inhale 2 puffs into the lungs as needed.     [provider]  citalopram (CELEXA) 40 MG tablet Take 0.5 tablets (20 mg total) by mouth daily for 30 days. 05/04/18 06/03/18  Ngetich, Dinah C, NP  gabapentin (NEURONTIN) 300 MG capsule TAKE 1 CAPSULE (300 MG TOTAL) BY MOUTH 4 (FOUR) TIMES DAILY. FOR NERVE PAIN 08/02/18   Levert FeinsteinYan, Yijun, MD  HYDROcodone-acetaminophen (NORCO/VICODIN) 5-325 MG tablet Take 1-2 tablets by mouth every 4 (four) hours as needed. 09/15/18   Rolan BuccoBelfi, Melanie, MD  KLOR-CON M10 10 MEQ tablet Take 10 mEq by mouth once a week. On Wednesday 08/12/17   [provider]  losartan (COZAAR) 50 MG tablet Take 1 tablet (50 mg total) daily by mouth. 02/02/17   Vassie LollMadera, Carlos, MD  nitroGLYCERIN (NITROSTAT) 0.4 MG SL tablet Place 1 tablet (0.4 mg total) under the tongue every 5 (five) minutes x 3 doses as needed for chest pain (chest pain). 02/18/17   Orpah CobbKadakia, Ajay, MD  ondansetron (ZOFRAN-ODT) 4 MG disintegrating tablet Take 4 mg by mouth daily as needed. 08/09/17   [provider]  rOPINIRole (REQUIP) 0.25 MG tablet TAKE 1 TABLET (0.25 MG  TOTAL) BY MOUTH AT BEDTIME. 06/07/18   Ngetich, Donalee Citrininah C, NP    Family History Family History  Problem Relation Age of Onset   Stroke Mother    COPD Mother    Dementia Mother    Breast cancer Mother    Diabetes Father    Heart attack Father    Diabetes Sister    Stroke Sister        x2   Hepatitis C Daughter    Diabetes Paternal Grandmother     Social History Social History   Tobacco Use   Smoking status: Current Every Day Smoker    Packs/day: 0.75    Years: 46.00    Pack years: 34.50    Types: Cigarettes   Smokeless tobacco: Never Used   Tobacco comment: Takes 2-3 puffs when smoking, 4 cigarettes a day  Substance Use Topics   Alcohol use: Yes    Alcohol/week: 1.0 standard drinks    Types: 1 Cans of beer per week    Frequency: Never    Comment: once or twice a year.    Drug use: No     Allergies   Amoxicillin, Clindamycin/lincomycin, Sulfonamide derivatives, and Wellbutrin [bupropion]   Review of Systems Review of Systems  Constitutional: Negative for fever.  HENT: Negative for sinus pressure.   Respiratory: Negative for shortness of breath.   Cardiovascular: Negative for chest pain.  Gastrointestinal: Negative for abdominal pain.  Genitourinary: Negative for flank pain.  Musculoskeletal: Positive for arthralgias. Negative for back pain.  Neurological: Positive for numbness.     Physical Exam Updated Vital Signs BP (!) 184/118 (BP Location: Right Arm)    Pulse 97    Temp 98.7 F (37.1 C) (Oral)    Resp 18    Ht 5\' 2"  (1.575 m)    Wt 54.4 kg    SpO2 100%    BMI 21.95 kg/m   Physical Exam Vitals signs and nursing note reviewed.  Constitutional:      Appearance: Normal appearance.  HENT:     Head: Normocephalic and atraumatic.     Mouth/Throat:     Mouth: Mucous membranes are moist.  Eyes:     Pupils: Pupils are equal, round, and reactive to light.  Neck:     Musculoskeletal: Normal range of motion and neck supple.  Cardiovascular:      Rate and Rhythm: Normal rate.  Pulmonary:     Effort: Pulmonary effort is normal.     Breath sounds: Normal breath sounds. No wheezing, rhonchi or rales.  Abdominal:     General: There is no distension.     Palpations: Abdomen is soft.     Tenderness: There is no abdominal tenderness. There is no right CVA tenderness or left CVA tenderness.  Musculoskeletal:     Right ankle: She exhibits decreased range of motion. She exhibits no swelling, no deformity, no laceration and normal pulse.     Comments: Strength is diminished on R. Foot during plantarflexion and dorsiflexion.  Pulses are present and symmetric.  There refills intact.  Extremity does not appear cool or swollen.  No changes in the skin.     Neurological:     Mental Status: She is alert and oriented to person, place, and time.      ED Treatments / Results  Labs (all labs ordered are listed, but only abnormal results are displayed) Labs Reviewed - No data to display  EKG None  Radiology No results found.  Procedures Procedures (including critical care time)  Medications Ordered in ED Medications  oxyCODONE-acetaminophen (PERCOCET/ROXICET) 5-325 MG per tablet 1 tablet (has no administration in time range)     Initial Impression / Assessment and Plan / ED Course  I have reviewed the triage vital signs and the nursing notes.  Pertinent labs & imaging results that were available during my care of the patient were reviewed by me and considered in my medical decision making (see chart for details).       Patient with a past medical history of COPD, depression, peripheral neuropathy presents to the ED with complaints of right ankle pain and numbness x1 month.  Patient reports she does suffer from peripheral neuropathy, the tingling in the pain and needle sensation to her right foot has gotten worse ever since her accident a month ago where she had an ankle  sprain.  She has been trying bandages, ambulates currently  with a crutch but reports her quality of life has decreased significantly.  Patient does get followed by Dr. Ronny Flurry, who gives her gabapentin for her restless leg syndrome.  Patient's right leg is significantly decreased strength, she is unable to plantarflex or dorsiflex.  Does have diminished sensation to her toes.  Reports her is blisters to them every other week.  She does have good pulses, is neurovascularly intact, however reports the pain has gotten out of proportion.  I attempted to reach social work and case management during her ED visit as patient currently has limited resources.  She is currently living in her sister's utility room, states she is homeless, she is unable to make it to most appointments as she does not have rides to get there.  I have personally reviewed patient's chart to an extent, see she has missed a couple appointments, looks as she has voicemails have been left, unsure whether patient has a working cell phone at this time however will place a call for case management along with social work in order to hopefully steer her to the right resources.  Patient will receive today in the ED 1 Percocet to help with her pain as she reports her pain is very severe.  I will not be granting any outpatient scripts, she is agreeable of this therapy at this time.  Vital signs are within normal limits, patient stable for discharge.   Portions of this note were generated with [employer]. Dictation errors may occur despite best attempts at proofreading.  Final Clinical Impressions(s) / ED Diagnoses   Final diagnoses:  Neuropathic pain of right foot    ED Discharge Orders    None       Janeece Fitting, Hershal Coria 12/16/18 1915    Sherwood Gambler, MD 12/16/18 2316

## 2018-12-16 NOTE — ED Notes (Signed)
ED Provider at bedside. 

## 2018-12-16 NOTE — Discharge Instructions (Addendum)
I suspect that your symptoms are likely coming from a chronic neurological issue.  You will need to schedule an appointment with your neurologist in order to have this better treated and managed.  I have forwarded your chart to case management in order to hopefully obtain some resources that can help.

## 2018-12-16 NOTE — ED Notes (Addendum)
Pt reports "months" of "pins and needles" in her right foot. Flexion and extension are weaker on right than left. States she feels like there is something on the bottom of her foot. Pt is homeless and states she has to walk everywhere. She is currently staying with her sister on her property. Thinks she may have sprained it prior to Sx starting. She states she has some foot drop

## 2018-12-16 NOTE — ED Triage Notes (Signed)
R foot pain for greater than 1 month. She was here over 1 month ago with an injury and states it has not healed. States her foot is cold and her toes are numb.

## 2018-12-20 ENCOUNTER — Telehealth: Payer: Self-pay | Admitting: Surgery

## 2018-12-20 NOTE — Telephone Encounter (Signed)
Addendum 12/17/18 ED CM received a message from EDP at [facility] concerning patient needing primary care follow up.  CM noted that patient is active at [facility] [place] contacted Marlowe Sax NP [phone] and secured chat message to arrange  transitional care follow up, patient has had 3 ED visits in the past 6 months. No further ED CM needs identified.

## 2019-01-05 ENCOUNTER — Inpatient Hospital Stay (HOSPITAL_COMMUNITY)
Admission: EM | Admit: 2019-01-05 | Discharge: 2019-01-06 | DRG: 064 | Disposition: A | Discharge status: Home / Self Care | Payer: Medicare HMO | Attending: Internal Medicine | Admitting: Internal Medicine

## 2019-01-05 ENCOUNTER — Emergency Department (HOSPITAL_COMMUNITY): Payer: Medicare HMO

## 2019-01-05 ENCOUNTER — Encounter (HOSPITAL_COMMUNITY): Payer: Self-pay | Admitting: Emergency Medicine

## 2019-01-05 ENCOUNTER — Other Ambulatory Visit: Payer: Self-pay

## 2019-01-05 DIAGNOSIS — E872 Acidosis: Secondary | ICD-10-CM | POA: Diagnosis present

## 2019-01-05 DIAGNOSIS — F32A Depression, unspecified: Secondary | ICD-10-CM | POA: Diagnosis present

## 2019-01-05 DIAGNOSIS — Z825 Family history of asthma and other chronic lower respiratory diseases: Secondary | ICD-10-CM

## 2019-01-05 DIAGNOSIS — F411 Generalized anxiety disorder: Secondary | ICD-10-CM | POA: Diagnosis present

## 2019-01-05 DIAGNOSIS — R0902 Hypoxemia: Secondary | ICD-10-CM | POA: Diagnosis present

## 2019-01-05 DIAGNOSIS — F11129 Opioid abuse with intoxication, unspecified: Secondary | ICD-10-CM | POA: Diagnosis present

## 2019-01-05 DIAGNOSIS — S62102A Fracture of unspecified carpal bone, left wrist, initial encounter for closed fracture: Secondary | ICD-10-CM

## 2019-01-05 DIAGNOSIS — F10959 Alcohol use, unspecified with alcohol-induced psychotic disorder, unspecified: Secondary | ICD-10-CM | POA: Diagnosis not present

## 2019-01-05 DIAGNOSIS — G2581 Restless legs syndrome: Secondary | ICD-10-CM | POA: Diagnosis present

## 2019-01-05 DIAGNOSIS — I63532 Cerebral infarction due to unspecified occlusion or stenosis of left posterior cerebral artery: Secondary | ICD-10-CM

## 2019-01-05 DIAGNOSIS — R4182 Altered mental status, unspecified: Secondary | ICD-10-CM | POA: Diagnosis not present

## 2019-01-05 DIAGNOSIS — G92 Toxic encephalopathy: Secondary | ICD-10-CM | POA: Diagnosis present

## 2019-01-05 DIAGNOSIS — E876 Hypokalemia: Secondary | ICD-10-CM | POA: Diagnosis not present

## 2019-01-05 DIAGNOSIS — Z882 Allergy status to sulfonamides status: Secondary | ICD-10-CM

## 2019-01-05 DIAGNOSIS — E861 Hypovolemia: Secondary | ICD-10-CM | POA: Diagnosis present

## 2019-01-05 DIAGNOSIS — R202 Paresthesia of skin: Secondary | ICD-10-CM | POA: Diagnosis not present

## 2019-01-05 DIAGNOSIS — Z7951 Long term (current) use of inhaled steroids: Secondary | ICD-10-CM

## 2019-01-05 DIAGNOSIS — F14129 Cocaine abuse with intoxication, unspecified: Secondary | ICD-10-CM | POA: Diagnosis present

## 2019-01-05 DIAGNOSIS — K760 Fatty (change of) liver, not elsewhere classified: Secondary | ICD-10-CM | POA: Diagnosis present

## 2019-01-05 DIAGNOSIS — S52615D Nondisplaced fracture of left ulna styloid process, subsequent encounter for closed fracture with routine healing: Secondary | ICD-10-CM

## 2019-01-05 DIAGNOSIS — G309 Alzheimer's disease, unspecified: Secondary | ICD-10-CM | POA: Diagnosis present

## 2019-01-05 DIAGNOSIS — T405X5A Adverse effect of cocaine, initial encounter: Secondary | ICD-10-CM | POA: Diagnosis present

## 2019-01-05 DIAGNOSIS — E785 Hyperlipidemia, unspecified: Secondary | ICD-10-CM | POA: Diagnosis present

## 2019-01-05 DIAGNOSIS — I10 Essential (primary) hypertension: Secondary | ICD-10-CM | POA: Diagnosis present

## 2019-01-05 DIAGNOSIS — I6389 Other cerebral infarction: Secondary | ICD-10-CM | POA: Diagnosis not present

## 2019-01-05 DIAGNOSIS — F12129 Cannabis abuse with intoxication, unspecified: Secondary | ICD-10-CM | POA: Diagnosis present

## 2019-01-05 DIAGNOSIS — F0280 Dementia in other diseases classified elsewhere without behavioral disturbance: Secondary | ICD-10-CM | POA: Diagnosis present

## 2019-01-05 DIAGNOSIS — I959 Hypotension, unspecified: Secondary | ICD-10-CM

## 2019-01-05 DIAGNOSIS — F15129 Other stimulant abuse with intoxication, unspecified: Secondary | ICD-10-CM | POA: Diagnosis present

## 2019-01-05 DIAGNOSIS — I6381 Other cerebral infarction due to occlusion or stenosis of small artery: Principal | ICD-10-CM | POA: Diagnosis present

## 2019-01-05 DIAGNOSIS — IMO0002 Reserved for concepts with insufficient information to code with codable children: Secondary | ICD-10-CM

## 2019-01-05 DIAGNOSIS — Z881 Allergy status to other antibiotic agents status: Secondary | ICD-10-CM

## 2019-01-05 DIAGNOSIS — D649 Anemia, unspecified: Secondary | ICD-10-CM | POA: Diagnosis not present

## 2019-01-05 DIAGNOSIS — Z590 Homelessness: Secondary | ICD-10-CM

## 2019-01-05 DIAGNOSIS — R29700 NIHSS score 0: Secondary | ICD-10-CM | POA: Diagnosis present

## 2019-01-05 DIAGNOSIS — I952 Hypotension due to drugs: Secondary | ICD-10-CM | POA: Diagnosis present

## 2019-01-05 DIAGNOSIS — Z20828 Contact with and (suspected) exposure to other viral communicable diseases: Secondary | ICD-10-CM | POA: Diagnosis present

## 2019-01-05 DIAGNOSIS — N179 Acute kidney failure, unspecified: Secondary | ICD-10-CM | POA: Diagnosis not present

## 2019-01-05 DIAGNOSIS — Z880 Allergy status to penicillin: Secondary | ICD-10-CM

## 2019-01-05 DIAGNOSIS — J449 Chronic obstructive pulmonary disease, unspecified: Secondary | ICD-10-CM | POA: Diagnosis present

## 2019-01-05 DIAGNOSIS — M810 Age-related osteoporosis without current pathological fracture: Secondary | ICD-10-CM | POA: Diagnosis present

## 2019-01-05 DIAGNOSIS — Z8673 Personal history of transient ischemic attack (TIA), and cerebral infarction without residual deficits: Secondary | ICD-10-CM

## 2019-01-05 DIAGNOSIS — K219 Gastro-esophageal reflux disease without esophagitis: Secondary | ICD-10-CM | POA: Diagnosis present

## 2019-01-05 DIAGNOSIS — Z888 Allergy status to other drugs, medicaments and biological substances status: Secondary | ICD-10-CM

## 2019-01-05 DIAGNOSIS — T43625A Adverse effect of amphetamines, initial encounter: Secondary | ICD-10-CM | POA: Diagnosis present

## 2019-01-05 DIAGNOSIS — G609 Hereditary and idiopathic neuropathy, unspecified: Secondary | ICD-10-CM | POA: Diagnosis present

## 2019-01-05 DIAGNOSIS — F329 Major depressive disorder, single episode, unspecified: Secondary | ICD-10-CM | POA: Diagnosis present

## 2019-01-05 DIAGNOSIS — T424X5A Adverse effect of benzodiazepines, initial encounter: Secondary | ICD-10-CM | POA: Diagnosis present

## 2019-01-05 DIAGNOSIS — T40605A Adverse effect of unspecified narcotics, initial encounter: Secondary | ICD-10-CM | POA: Diagnosis present

## 2019-01-05 DIAGNOSIS — F17210 Nicotine dependence, cigarettes, uncomplicated: Secondary | ICD-10-CM | POA: Diagnosis present

## 2019-01-05 DIAGNOSIS — Z823 Family history of stroke: Secondary | ICD-10-CM

## 2019-01-05 DIAGNOSIS — Z8701 Personal history of pneumonia (recurrent): Secondary | ICD-10-CM

## 2019-01-05 DIAGNOSIS — I639 Cerebral infarction, unspecified: Secondary | ICD-10-CM | POA: Diagnosis not present

## 2019-01-05 DIAGNOSIS — Z9981 Dependence on supplemental oxygen: Secondary | ICD-10-CM

## 2019-01-05 DIAGNOSIS — Z79899 Other long term (current) drug therapy: Secondary | ICD-10-CM

## 2019-01-05 LAB — CBC WITH DIFFERENTIAL/PLATELET
Abs Immature Granulocytes: 0.04 10*3/uL (ref 0.00–0.07)
Basophils Absolute: 0.1 10*3/uL (ref 0.0–0.1)
Basophils Relative: 1 %
Eosinophils Absolute: 0.2 10*3/uL (ref 0.0–0.5)
Eosinophils Relative: 2 %
HCT: 39.3 % (ref 36.0–46.0)
Hemoglobin: 12.8 g/dL (ref 12.0–15.0)
Immature Granulocytes: 0 %
Lymphocytes Relative: 30 %
Lymphs Abs: 3.2 10*3/uL (ref 0.7–4.0)
MCH: 29.4 pg (ref 26.0–34.0)
MCHC: 32.6 g/dL (ref 30.0–36.0)
MCV: 90.1 fL (ref 80.0–100.0)
Monocytes Absolute: 0.8 10*3/uL (ref 0.1–1.0)
Monocytes Relative: 8 %
Neutro Abs: 6.4 10*3/uL (ref 1.7–7.7)
Neutrophils Relative %: 59 %
Platelets: 211 10*3/uL (ref 150–400)
RBC: 4.36 MIL/uL (ref 3.87–5.11)
RDW: 14.7 % (ref 11.5–15.5)
WBC: 10.7 10*3/uL — ABNORMAL HIGH (ref 4.0–10.5)
nRBC: 0 % (ref 0.0–0.2)

## 2019-01-05 LAB — BASIC METABOLIC PANEL
Anion gap: 14 (ref 5–15)
BUN: 37 mg/dL — ABNORMAL HIGH (ref 6–20)
CO2: 21 mmol/L — ABNORMAL LOW (ref 22–32)
Calcium: 9.1 mg/dL (ref 8.9–10.3)
Chloride: 104 mmol/L (ref 98–111)
Creatinine, Ser: 2.91 mg/dL — ABNORMAL HIGH (ref 0.44–1.00)
GFR calc Af Amer: 19 mL/min — ABNORMAL LOW (ref >60)
GFR calc non Af Amer: 17 mL/min — ABNORMAL LOW (ref >60)
Glucose, Bld: 114 mg/dL — ABNORMAL HIGH (ref 70–99)
Potassium: 3.7 mmol/L (ref 3.5–5.1)
Sodium: 139 mmol/L (ref 135–145)

## 2019-01-05 LAB — TROPONIN I (HIGH SENSITIVITY)
Troponin I (High Sensitivity): 6 ng/L (ref <18)
Troponin I (High Sensitivity): 6 ng/L (ref <18)

## 2019-01-05 LAB — RAPID URINE DRUG SCREEN, HOSP PERFORMED
Amphetamines: POSITIVE — AB
Barbiturates: NOT DETECTED
Benzodiazepines: NOT DETECTED
Cocaine: POSITIVE — AB
Opiates: POSITIVE — AB
Tetrahydrocannabinol: POSITIVE — AB

## 2019-01-05 LAB — POCT I-STAT EG7
Acid-base deficit: 6 mmol/L — ABNORMAL HIGH (ref 0.0–2.0)
Bicarbonate: 22.6 mmol/L (ref 20.0–28.0)
Calcium, Ion: 1.18 mmol/L (ref 1.15–1.40)
HCT: 38 % (ref 36.0–46.0)
Hemoglobin: 12.9 g/dL (ref 12.0–15.0)
O2 Saturation: 87 %
Potassium: 3.7 mmol/L (ref 3.5–5.1)
Sodium: 140 mmol/L (ref 135–145)
TCO2: 24 mmol/L (ref 22–32)
pCO2, Ven: 55.4 mmHg (ref 44.0–60.0)
pH, Ven: 7.218 — ABNORMAL LOW (ref 7.250–7.430)
pO2, Ven: 65 mmHg — ABNORMAL HIGH (ref 32.0–45.0)

## 2019-01-05 LAB — URINALYSIS, ROUTINE W REFLEX MICROSCOPIC
Bilirubin Urine: NEGATIVE
Glucose, UA: NEGATIVE mg/dL
Ketones, ur: 5 mg/dL — AB
Leukocytes,Ua: NEGATIVE
Nitrite: NEGATIVE
Protein, ur: 30 mg/dL — AB
Specific Gravity, Urine: 1.017 (ref 1.005–1.030)
pH: 5 (ref 5.0–8.0)

## 2019-01-05 LAB — AMMONIA: Ammonia: 23 umol/L (ref 9–35)

## 2019-01-05 LAB — LACTIC ACID, PLASMA: Lactic Acid, Venous: 1.2 mmol/L (ref 0.5–1.9)

## 2019-01-05 LAB — PROTIME-INR
INR: 1.1 (ref 0.8–1.2)
Prothrombin Time: 14 seconds (ref 11.4–15.2)

## 2019-01-05 LAB — SARS CORONAVIRUS 2 BY RT PCR (HOSPITAL ORDER, PERFORMED IN ~~LOC~~ HOSPITAL LAB): SARS Coronavirus 2: NEGATIVE

## 2019-01-05 LAB — SALICYLATE LEVEL: Salicylate Lvl: <7 mg/dL (ref 2.8–30.0)

## 2019-01-05 LAB — ACETAMINOPHEN LEVEL: Acetaminophen (Tylenol), Serum: <10 ug/mL — ABNORMAL LOW (ref 10–30)

## 2019-01-05 LAB — ETHANOL: Alcohol, Ethyl (B): <10 mg/dL (ref <10)

## 2019-01-05 MED ORDER — ATORVASTATIN CALCIUM 40 MG PO TABS
40.0000 mg | ORAL_TABLET | Freq: Every day | ORAL | Status: DC
  Filled 2019-01-05: qty 1

## 2019-01-05 MED ORDER — MOMETASONE FURO-FORMOTEROL FUM 200-5 MCG/ACT IN AERO
2.0000 | INHALATION_SPRAY | Freq: Two times a day (BID) | RESPIRATORY_TRACT | Status: DC
  Administered 2019-01-06: 08:00:00 2 via RESPIRATORY_TRACT
  Filled 2019-01-05: qty 8.8

## 2019-01-05 MED ORDER — SODIUM CHLORIDE 0.9 % IV BOLUS
1000.0000 mL | Freq: Once | INTRAVENOUS | Status: AC
  Administered 2019-01-05: 16:00:00 1000 mL via INTRAVENOUS

## 2019-01-05 MED ORDER — CLOPIDOGREL BISULFATE 75 MG PO TABS
75.0000 mg | ORAL_TABLET | Freq: Every day | ORAL | Status: DC
  Administered 2019-01-06 (×2): 75 mg via ORAL
  Filled 2019-01-05 (×2): qty 1

## 2019-01-05 MED ORDER — ASPIRIN EC 81 MG PO TBEC
81.0000 mg | DELAYED_RELEASE_TABLET | Freq: Every day | ORAL | Status: DC
  Administered 2019-01-06 (×2): 81 mg via ORAL
  Filled 2019-01-05 (×2): qty 1

## 2019-01-05 MED ORDER — SODIUM CHLORIDE 0.9 % IV BOLUS
1000.0000 mL | Freq: Once | INTRAVENOUS | Status: AC
  Administered 2019-01-05: 1000 mL via INTRAVENOUS

## 2019-01-05 MED ORDER — DEXTROSE-NACL 5-0.45 % IV SOLN
INTRAVENOUS | Status: AC
  Administered 2019-01-05: 23:00:00 via INTRAVENOUS

## 2019-01-05 MED ORDER — SODIUM CHLORIDE 0.9 % IV BOLUS: 1000.0000 mL | Freq: Once | INTRAVENOUS | Status: DC

## 2019-01-05 MED ORDER — LORAZEPAM 2 MG/ML IJ SOLN
INTRAMUSCULAR | Status: AC
  Filled 2019-01-05: qty 1

## 2019-01-05 MED ORDER — SODIUM CHLORIDE 0.9 % IV SOLN
Freq: Once | INTRAVENOUS | Status: AC
  Administered 2019-01-05: 18:00:00 via INTRAVENOUS

## 2019-01-05 MED ORDER — LORAZEPAM 2 MG/ML IJ SOLN
0.5000 mg | Freq: Once | INTRAMUSCULAR | Status: AC
  Administered 2019-01-05: 0.5 mg via INTRAVENOUS

## 2019-01-05 MED ORDER — ACETAMINOPHEN 325 MG PO TABS
650.0000 mg | ORAL_TABLET | ORAL | Status: DC | PRN
  Administered 2019-01-06 (×2): 650 mg via ORAL
  Filled 2019-01-05 (×2): qty 2

## 2019-01-05 MED ORDER — STROKE: EARLY STAGES OF RECOVERY BOOK
Freq: Once | Status: AC
  Administered 2019-01-05: 1
  Filled 2019-01-05: qty 1

## 2019-01-05 MED ORDER — HEPARIN SODIUM (PORCINE) 5000 UNIT/ML IJ SOLN
5000.0000 [IU] | Freq: Three times a day (TID) | INTRAMUSCULAR | Status: DC
  Administered 2019-01-05 – 2019-01-06 (×3): 5000 [IU] via SUBCUTANEOUS
  Filled 2019-01-05 (×3): qty 1

## 2019-01-05 MED ORDER — ALBUTEROL SULFATE (2.5 MG/3ML) 0.083% IN NEBU: 3.0000 mL | INHALATION_SOLUTION | Freq: Four times a day (QID) | RESPIRATORY_TRACT | Status: DC | PRN

## 2019-01-05 MED ORDER — SENNOSIDES-DOCUSATE SODIUM 8.6-50 MG PO TABS: 1.0000 | ORAL_TABLET | Freq: Every evening | ORAL | Status: DC | PRN

## 2019-01-05 MED ORDER — ACETAMINOPHEN 650 MG RE SUPP: 650.0000 mg | RECTAL | Status: DC | PRN

## 2019-01-05 MED ORDER — ACETAMINOPHEN 160 MG/5ML PO SOLN: 650.0000 mg | ORAL | Status: DC | PRN

## 2019-01-05 NOTE — ED Triage Notes (Signed)
Pt arrives to ED with friend. Pulled out of car by ED staff. Friend reports pt has hx of heroin use. States the patient called her and told her she fell and hurt her wrist and needed to go to the hospital. Upon arrival of the friend, pt was altered. Friend brought pt here. Pt is supposed to wear 2-5L O2 via nasal cannula. Pt was very agitated and altered on room air. 85% on room air  Upon arrival. Placed the pt on 3L. Agitation improved. Pt still exhibiting behavior of possible drug use. Left wrist with obvious deformity noted.

## 2019-01-05 NOTE — ED Notes (Signed)
Vonna Drafts - [phone], friend that brought pt to hospital, called to see how she was doing.  He stated that he [place] her [place] [address] where [place] were a lot of drugs.  He states that she ambulated to the car, but then she couldn't figure out how to sit in the car.  He states he thinks she is "back into drugs" because the hotel he picked her up in is a "drug area".

## 2019-01-05 NOTE — ED Notes (Signed)
Patient transported to MRI 

## 2019-01-05 NOTE — ED Notes (Signed)
Returned from MRI 

## 2019-01-05 NOTE — ED Provider Notes (Signed)
[facility] EMERGENCY DEPARTMENT Provider Note   CSN: [id number] Arrival date & time: 01/05/19  1445     History   Chief Complaint Chief Complaint  Patient presents with   Altered Mental Status    HPI Brittany [name] is a 63 y.o. female.     The history is provided by the patient and medical records. No language interpreter was used.  Altered Mental Status Presenting symptoms: confusion   Severity:  Severe Most recent episode:  Today Episode history:  Single Timing:  Constant Progression:  Improving (with oxygen) Chronicity:  New Context: dementia, drug use (possible per friend report) and head injury   Associated symptoms: headaches   Associated symptoms: no abdominal pain, no fever (chills), no light-headedness, no nausea, no palpitations, no rash, no vomiting and no weakness     Past Medical History:  Diagnosis Date   Alzheimer disease (HCC)    Anxiety    Bilateral pneumonia 01/2017   per new patient packet    COPD (chronic obstructive pulmonary disease) (HCC) 01/2017   per new patient packet    Depression    Fatty liver    Gallbladder disease 2014   per new patient packet    Hernia, abdominal    Hypertension    Liver damage 01/2017   per new patient packet, Dr.Kadakia   Normal cardiac stress test 2007   Ovarian cyst 1977   8 1/2 lb left ovarian cyst, Dr.Cox, per new patient packet    Pneumonia    Right ovarian cyst    1992, 1993, 1994, 1995, and 1996 Dr.Neil, per new patient packet    Stroke (HCC)    2 strokes.     Patient Active Problem List   Diagnosis Date Noted   Restless leg syndrome 07/10/2018   Peripheral neuropathy 07/10/2018   COPD (chronic obstructive pulmonary disease) (HCC) 10/05/2017   GERD (gastroesophageal reflux disease) 10/05/2017   Depression 10/05/2017   Facial cellulitis-left 10/05/2017   Diarrhea 10/05/2017   Chronic viral hepatitis C (HCC) 06/20/2017   Idiopathic peripheral  neuropathy 05/19/2017   Chronic low back pain without sciatica 05/19/2017   Hyperglycemia 05/19/2017   Chronic bronchitis (HCC) 03/08/2017   Elevated LFTs 03/08/2017   Current severe episode of major depressive disorder without psychotic features (HCC) 03/08/2017   GAD (generalized anxiety disorder) 03/08/2017   Hepatomegaly 03/08/2017   Acute exacerbation of chronic obstructive pulmonary disease (COPD) (HCC) 02/16/2017   Acute respiratory failure with hypoxia (HCC)    COPD with acute exacerbation (HCC) 01/27/2017   CAP (community acquired pneumonia) 01/27/2017   Tachycardia 01/27/2017   COPD exacerbation (HCC) 01/27/2017   Hypokalemia 01/27/2017   Left arm numbness 11/28/2016    Class: Chronic   Nondisplaced transverse fracture of left patella, initial encounter for closed fracture 06/10/2016   Malnutrition of moderate degree (HCC) 07/12/2013   Syncope 07/11/2013   GASTROENTERITIS 05/07/2010   TOBACCO ABUSE 07/01/2009   Benign essential HTN 06/24/2009   BRONCHITIS, ACUTE 06/24/2009    Past Surgical History:  Procedure Laterality Date   ABDOMINAL HYSTERECTOMY Bilateral    Total   CESAREAN SECTION     CHOLECYSTECTOMY     INGUINAL HERNIA REPAIR Right    LEFT OOPHORECTOMY  1977   with cyst removal, age 64   MINOR EXCISION OF ORAL LESION N/A 10/07/2017   Procedure: IRRIGATION AND DEBRIDEMENT OF ORAL INFECTION, REMOVAL OF REMAINING 8 TEETH AND PLACEMENT OF PENROSE DRAINS;  Surgeon: Vivia Ewing, DMD;  Location: [facility];  Service: Oral Surgery;  Laterality: N/A;   OVARIAN CYST REMOVAL Right 93, 94,95, 96   UMBILICAL HERNIA REPAIR       OB History   No obstetric history on file.      Home Medications    Prior to Admission medications   Medication Sig Start Date End Date Taking? Authorizing Provider  albuterol (PROVENTIL HFA;VENTOLIN HFA) 108 (90 Base) MCG/ACT inhaler Inhale 2 puffs into the lungs every 6 (six) hours as needed for wheezing or  shortness of breath.    [provider]  alendronate (FOSAMAX) 70 MG tablet TAKE 1 TABLET BY MOUTH WEEKLY WITH FULL GLASS OF WATER ON EMPTY STOMACH 30 MIN PRIOR TO BREAKFAST 08/14/18   Ngetich, Dinah C, NP  budesonide-formoterol (SYMBICORT) 160-4.5 MCG/ACT inhaler Inhale 2 puffs into the lungs as needed.     [provider]  citalopram (CELEXA) 40 MG tablet Take 0.5 tablets (20 mg total) by mouth daily for 30 days. 05/04/18 06/03/18  Ngetich, Dinah C, NP  gabapentin (NEURONTIN) 300 MG capsule TAKE 1 CAPSULE (300 MG TOTAL) BY MOUTH 4 (FOUR) TIMES DAILY. FOR NERVE PAIN 08/02/18   Levert Feinstein, MD  HYDROcodone-acetaminophen (NORCO/VICODIN) 5-325 MG tablet Take 1-2 tablets by mouth every 4 (four) hours as needed. 09/15/18   Rolan Bucco, MD  KLOR-CON M10 10 MEQ tablet Take 10 mEq by mouth once a week. On Wednesday 08/12/17   [provider]  losartan (COZAAR) 50 MG tablet Take 1 tablet (50 mg total) daily by mouth. 02/02/17   Vassie Loll, MD  nitroGLYCERIN (NITROSTAT) 0.4 MG SL tablet Place 1 tablet (0.4 mg total) under the tongue every 5 (five) minutes x 3 doses as needed for chest pain (chest pain). 02/18/17   Orpah Cobb, MD  ondansetron (ZOFRAN-ODT) 4 MG disintegrating tablet Take 4 mg by mouth daily as needed. 08/09/17   [provider]  rOPINIRole (REQUIP) 0.25 MG tablet TAKE 1 TABLET (0.25 MG TOTAL) BY MOUTH AT BEDTIME. 06/07/18   Ngetich, Donalee Citrin, NP    Family History Family History  Problem Relation Age of Onset   Stroke Mother    COPD Mother    Dementia Mother    Breast cancer Mother    Diabetes Father    Heart attack Father    Diabetes Sister    Stroke Sister        x2   Hepatitis C Daughter    Diabetes Paternal Grandmother     Social History Social History   Tobacco Use   Smoking status: Current Every Day Smoker    Packs/day: 0.75    Years: 46.00    Pack years: 34.50    Types: Cigarettes   Smokeless tobacco: Never Used    Tobacco comment: Takes 2-3 puffs when smoking, 4 cigarettes a day   Substance Use Topics   Alcohol use: Yes    Alcohol/week: 1.0 standard drinks    Types: 1 Cans of beer per week    Frequency: Never    Comment: once or twice a year.    Drug use: No     Allergies   Amoxicillin, Clindamycin/lincomycin, Sulfonamide derivatives, and Wellbutrin [bupropion]   Review of Systems Review of Systems  Constitutional: Positive for chills and fatigue. Negative for diaphoresis and fever (chills).  HENT: Negative for congestion.   Eyes: Negative for visual disturbance.  Respiratory: Positive for cough and shortness of breath. Negative for chest tightness and wheezing.   Cardiovascular: Negative for chest pain, palpitations and leg swelling.  Gastrointestinal: Negative for abdominal pain, constipation, diarrhea, nausea and vomiting.  Genitourinary: Negative for dysuria and flank pain.  Musculoskeletal: Negative for back pain, neck pain and neck stiffness.  Skin: Negative for rash and wound.  Neurological: Positive for headaches. Negative for dizziness, syncope, weakness, light-headedness and numbness.  Psychiatric/Behavioral: Positive for confusion.  All other systems reviewed and are negative.    Physical Exam Updated Vital Signs BP (!) 84/51    Pulse 98    Temp (!) 97.5 F (36.4 C)    Resp 10    Ht  (1.575 m)    Wt 54.4 kg    SpO2 100%    BMI 21.95 kg/m   Physical Exam Vitals signs and nursing note reviewed.  Constitutional:      General: She is not in acute distress.    Appearance: She is well-developed. She is ill-appearing. She is not toxic-appearing or diaphoretic.  HENT:     Head: Normocephalic and atraumatic.     Right Ear: External ear normal.     Left Ear: External ear normal.     Nose: Nose normal. No congestion or rhinorrhea.     Mouth/Throat:     Mouth: Mucous membranes are moist.     Pharynx: No oropharyngeal exudate or posterior oropharyngeal erythema.  Eyes:       Extraocular Movements: Extraocular movements intact.     Conjunctiva/sclera: Conjunctivae normal.     Pupils: Pupils are equal, round, and reactive to light.  Neck:     Musculoskeletal: Normal range of motion and neck supple. No muscular tenderness.  Cardiovascular:     Rate and Rhythm: Normal rate.     Pulses: Normal pulses.     Heart sounds: No murmur.  Pulmonary:     Effort: Pulmonary effort is normal. No respiratory distress.     Breath sounds: No stridor. No wheezing, rhonchi or rales.  Chest:     Chest wall: No tenderness.  Abdominal:     General: There is no distension.     Tenderness: There is no abdominal tenderness. There is no right CVA tenderness, left CVA tenderness or rebound.  Musculoskeletal:        General: Tenderness, deformity and signs of injury present.     Left wrist: She exhibits tenderness and deformity. She exhibits no laceration.     Left hand: She exhibits tenderness.       Hands:     Right lower leg: No edema.     Left lower leg: No edema.  Skin:    General: Skin is warm.     Capillary Refill: Capillary refill takes less than 2 seconds.     Findings: No erythema or rash.  Neurological:     General: No focal deficit present.     Mental Status: She is alert.     Sensory: No sensory deficit.     Motor: No weakness or abnormal muscle tone.     Deep Tendon Reflexes: Reflexes are normal and symmetric.      ED Treatments / Results  Labs (all labs ordered are listed, but only abnormal results are displayed) Labs Reviewed  BASIC METABOLIC PANEL - Abnormal; Notable for the following components:      Result Value   CO2 21 (*)    Glucose, Bld 114 (*)    BUN 37 (*)    Creatinine, Ser 2.91 (*)    GFR calc non Af Amer 17 (*)    GFR calc Af Amer 19 (*)  All other components within normal limits  CBC WITH DIFFERENTIAL/PLATELET - Abnormal; Notable for the following components:   WBC 10.7 (*)    All other components within normal limits  RAPID  URINE DRUG SCREEN, HOSP PERFORMED - Abnormal; Notable for the following components:   Opiates POSITIVE (*)    Cocaine POSITIVE (*)    Amphetamines POSITIVE (*)    Tetrahydrocannabinol POSITIVE (*)    All other components within normal limits  URINALYSIS, ROUTINE W REFLEX MICROSCOPIC - Abnormal; Notable for the following components:   APPearance HAZY (*)    Hgb urine dipstick SMALL (*)    Ketones, ur 5 (*)    Protein, ur 30 (*)    Bacteria, UA RARE (*)    All other components within normal limits  ACETAMINOPHEN LEVEL - Abnormal; Notable for the following components:   Acetaminophen (Tylenol), Serum <10 (*)    All other components within normal limits  POCT I-STAT EG7 - Abnormal; Notable for the following components:   pH, Ven 7.218 (*)    pO2, Ven 65.0 (*)    Acid-base deficit 6.0 (*)    All other components within normal limits  SARS CORONAVIRUS 2 BY RT PCR (HOSPITAL ORDER, PERFORMED IN [facility])  CULTURE, BLOOD (ROUTINE X 2)  CULTURE, BLOOD (ROUTINE X 2)  URINE CULTURE  ETHANOL  AMMONIA  PROTIME-INR  SALICYLATE LEVEL  LACTIC ACID, PLASMA  HEPATIC FUNCTION PANEL  HIV ANTIBODY (ROUTINE TESTING W REFLEX)  HIV4GL SAVE TUBE  HEMOGLOBIN A1C  LIPID PANEL  CBC  BASIC METABOLIC PANEL  I-STAT VENOUS BLOOD GAS, ED  TROPONIN I (HIGH SENSITIVITY)  TROPONIN I (HIGH SENSITIVITY)    EKG EKG Interpretation  Date/Time:  Saturday January 05 2019 14:56:16 EDT Ventricular Rate:  109 PR Interval:    QRS Duration: 81 QT Interval:  353 QTC Calculation: 476 R Axis:   59 Text Interpretation:  Sinus tachycardia Probable left atrial enlargement When compared to prior, similar tachycardia No STEMI Confirmed by Theda Belfast ([id number]) on 01/05/2019 3:02:36 PM   Radiology Dg Chest 1 View  Result Date: 01/05/2019 CLINICAL DATA:  63 year old female with hypoxia. EXAM: CHEST  1 VIEW COMPARISON:  Chest radiograph dated 02/16/2017 FINDINGS: The lungs are clear. There is no  pleural effusion or pneumothorax. The cardiac silhouette is within normal limits. Atherosclerotic calcification of the aortic arch. No acute osseous pathology. IMPRESSION: No active cardiopulmonary disease. Electronically Signed   By: Elgie Collard M.D.   On: 01/05/2019 15:32   Dg Elbow 2 Views Left  Result Date: 01/05/2019 CLINICAL DATA:  63 year old female with fall and trauma to the left upper extremity. EXAM: LEFT WRIST - COMPLETE 3+ VIEW; LEFT ELBOW - 2 VIEW COMPARISON:  None. FINDINGS: There is a comminuted and minimally angulated fracture of distal radius. There is a transverse component of the fracture through the distal radius and a longitudinal component which appears to extend into the articular surface. There is minimal dorsal angulation of the distal fracture fragment. Nondisplaced fracture of the ulnar styloid. No other acute fracture identified. There is no acute fracture or dislocation at the elbow. The bones are osteopenic. There is no dislocation. There is mild soft tissue swelling of the wrist. No radiopaque foreign object or soft tissue gas. IMPRESSION: 1. Comminuted fracture of the distal radius and nondisplaced fracture of the ulnar styloid. 2. No acute fracture or dislocation of the elbow. Electronically Signed   By: Elgie Collard M.D.   On: 01/05/2019 15:36  Dg Wrist Complete Left  Result Date: 01/05/2019 CLINICAL DATA:  63 year old female with fall and trauma to the left upper extremity. EXAM: LEFT WRIST - COMPLETE 3+ VIEW; LEFT ELBOW - 2 VIEW COMPARISON:  None. FINDINGS: There is a comminuted and minimally angulated fracture of distal radius. There is a transverse component of the fracture through the distal radius and a longitudinal component which appears to extend into the articular surface. There is minimal dorsal angulation of the distal fracture fragment. Nondisplaced fracture of the ulnar styloid. No other acute fracture identified. There is no acute fracture or  dislocation at the elbow. The bones are osteopenic. There is no dislocation. There is mild soft tissue swelling of the wrist. No radiopaque foreign object or soft tissue gas. IMPRESSION: 1. Comminuted fracture of the distal radius and nondisplaced fracture of the ulnar styloid. 2. No acute fracture or dislocation of the elbow. Electronically Signed   By: Elgie Collard M.D.   On: 01/05/2019 15:36   Ct Head Wo Contrast  Result Date: 01/05/2019 CLINICAL DATA:  Altered level of consciousness.  C-spine trauma. EXAM: CT HEAD WITHOUT CONTRAST CT CERVICAL SPINE WITHOUT CONTRAST TECHNIQUE: Multidetector CT imaging of the head and cervical spine was performed following the standard protocol without intravenous contrast. Multiplanar CT image reconstructions of the cervical spine were also generated. COMPARISON:  July 11, 2013 FINDINGS: CT HEAD FINDINGS Brain: No subdural, epidural, or subarachnoid hemorrhage. Ventricles and sulci are normal. A lacunar infarct is seen in the right cerebellum on series 4, image 8, new since 2015. Remainder of the cerebellum is normal. The brainstem and basal cisterns are normal. Patchy low-attenuation in the right occipital lobe involving the cortex is consistent with sequela of infarct, nonacute in appearance. There is a small infarct in the right frontal lobe, best seen on coronal image 29. This also has a nonacute appearance. No acute ischemia or infarct is identified. No mass effect or midline shift identified. Vascular: Calcified atherosclerosis is seen in the intracranial carotids. Skull: Normal. Negative for fracture or focal lesion. Sinuses/Orbits: No acute finding. Other: None. CT CERVICAL SPINE FINDINGS Alignment: Normal. Skull base and vertebrae: No acute fracture. No primary bone lesion or focal pathologic process. Soft tissues and spinal canal: No prevertebral fluid or swelling. No visible canal hematoma. Disc levels:  Focal degenerative changes at C6-7. Upper chest:  Negative. Other: No other abnormalities are identified. IMPRESSION: 1. There is a lacunar infarct in the right cerebellar hemisphere, patchy low-attenuation in the right occipital lobe involving cortex most consistent with nonacute infarct, and a small infarct in the right frontal lobe favored to be nonacute as well. No acute intracranial abnormalities identified. 2. No fracture or traumatic malalignment in the cervical spine. Electronically Signed   By: Gerome Sam III M.D   On: 01/05/2019 16:34   Ct Cervical Spine Wo Contrast  Result Date: 01/05/2019 CLINICAL DATA:  Altered level of consciousness.  C-spine trauma. EXAM: CT HEAD WITHOUT CONTRAST CT CERVICAL SPINE WITHOUT CONTRAST TECHNIQUE: Multidetector CT imaging of the head and cervical spine was performed following the standard protocol without intravenous contrast. Multiplanar CT image reconstructions of the cervical spine were also generated. COMPARISON:  July 11, 2013 FINDINGS: CT HEAD FINDINGS Brain: No subdural, epidural, or subarachnoid hemorrhage. Ventricles and sulci are normal. A lacunar infarct is seen in the right cerebellum on series 4, image 8, new since 2015. Remainder of the cerebellum is normal. The brainstem and basal cisterns are normal. Patchy low-attenuation in the right occipital lobe  involving the cortex is consistent with sequela of infarct, nonacute in appearance. There is a small infarct in the right frontal lobe, best seen on coronal image 29. This also has a nonacute appearance. No acute ischemia or infarct is identified. No mass effect or midline shift identified. Vascular: Calcified atherosclerosis is seen in the intracranial carotids. Skull: Normal. Negative for fracture or focal lesion. Sinuses/Orbits: No acute finding. Other: None. CT CERVICAL SPINE FINDINGS Alignment: Normal. Skull base and vertebrae: No acute fracture. No primary bone lesion or focal pathologic process. Soft tissues and spinal canal: No prevertebral  fluid or swelling. No visible canal hematoma. Disc levels:  Focal degenerative changes at C6-7. Upper chest: Negative. Other: No other abnormalities are identified. IMPRESSION: 1. There is a lacunar infarct in the right cerebellar hemisphere, patchy low-attenuation in the right occipital lobe involving cortex most consistent with nonacute infarct, and a small infarct in the right frontal lobe favored to be nonacute as well. No acute intracranial abnormalities identified. 2. No fracture or traumatic malalignment in the cervical spine. Electronically Signed   By: Gerome Samavid  Williams III M.D   On: 01/05/2019 16:34   Mr Brain Wo Contrast  Result Date: 01/05/2019 CLINICAL DATA:  63 year old female with altered mental status. EXAM: MRI HEAD WITHOUT CONTRAST TECHNIQUE: Multiplanar, multiecho pulse sequences of the brain and surrounding structures were obtained without intravenous contrast. COMPARISON:  Head and cervical spine CT earlier today. FINDINGS: Study is intermittently degraded by motion artifact despite repeated imaging attempts. Brain: There is a small 4 millimeter focus of restricted diffusion in the left occipital pole on series 2, image 25. Faint associated T2 and FLAIR hyperintensity. Mildly noisy diffusion imaging elsewhere, with no other convincing restricted diffusion or acute infarction. Chronic encephalomalacia with facilitated diffusion in the right occipital lobe, and similar small chronic infarct in the right cerebellum. Likewise, small area of chronic encephalomalacia in the anterior right middle frontal gyrus with facilitated diffusion. These correspond to the earlier CT and there is associated hemosiderin at each of these sites. There is also a tiny area of cortical encephalomalacia at the right superior motor strip on series 7, image 24. And also mild chronic hemosiderin along the right anterior inferior frontal gyrus. Patchy bilateral cerebral white matter T2 and FLAIR hyperintensity. Some areas  resemble cystic encephalomalacia. No other chronic cerebral blood products. Only minor T2 heterogeneity in the right caudate. Negative brainstem. No midline shift, mass effect, evidence of mass lesion, ventriculomegaly, extra-axial collection or acute intracranial hemorrhage. Cervicomedullary junction and pituitary are within normal limits. Vascular: Major intracranial vascular flow voids are preserved. Skull and upper cervical spine: Negative visible cervical spine. Visualized bone marrow signal is within normal limits. Sinuses/Orbits: Negative orbits. Trace paranasal sinus mucosal thickening. Other: Mastoids are clear. Visible internal auditory structures appear normal. Scalp and face soft tissues appear negative. IMPRESSION: 1. Punctate acute lacunar infarct suspected in the left occipital pole. No associated hemorrhage or mass effect. 2. Small areas of chronic encephalomalacia with associated hemosiderin in the right frontal, right occipital, and right cerebellum corresponding to the CT findings earlier today. 3. Superimposed cerebral white matter signal abnormality, favored due to chronic small vessel disease. Electronically Signed   By: Odessa FlemingH  Hall M.D.   On: 01/05/2019 20:24    Procedures Procedures (including critical care time)  Medications Ordered in ED Medications  acetaminophen (TYLENOL) tablet 650 mg (has no administration in time range)    Or  acetaminophen (TYLENOL) solution 650 mg (has no administration in time range)  Or  acetaminophen (TYLENOL) suppository 650 mg (has no administration in time range)  senna-docusate (Senokot-S) tablet 1 tablet (has no administration in time range)  dextrose 5 %-0.45 % sodium chloride infusion ( Intravenous New Bag/Given 01/05/19 2320)  mometasone-formoterol (DULERA) 200-5 MCG/ACT inhaler 2 puff (has no administration in time range)  albuterol (PROVENTIL) (2.5 MG/3ML) 0.083% nebulizer solution 3 mL (has no administration in time range)  heparin  injection 5,000 Units (5,000 Units Subcutaneous Given 01/05/19 2316)  aspirin EC tablet 81 mg (has no administration in time range)  clopidogrel (PLAVIX) tablet 75 mg (has no administration in time range)  atorvastatin (LIPITOR) tablet 40 mg (has no administration in time range)  sodium chloride 0.9 % bolus 1,000 mL (0 mLs Intravenous Stopped 01/05/19 1603)  sodium chloride 0.9 % bolus 1,000 mL (0 mLs Intravenous Stopped 01/05/19 1604)  sodium chloride 0.9 % bolus 1,000 mL (0 mLs Intravenous Stopped 01/05/19 1807)  LORazepam (ATIVAN) injection 0.5 mg (0.5 mg Intravenous Given 01/05/19 1807)  0.9 %  sodium chloride infusion ( Intravenous Stopped 01/05/19 2315)   stroke: mapping our early stages of recovery book (1 each Does not apply Given 01/05/19 2315)     Initial Impression / Assessment and Plan / ED Course  I have reviewed the triage vital signs and the nursing notes.  Pertinent labs & imaging results that were available during my care of the patient were reviewed by me and considered in my medical decision making (see chart for details).        Brittany [name] is a 63 y.o. female with a past medical history significant for COPD with reported home oxygen requirement, hypertension, hepatitis C, anxiety, depression, peripheral neuropathies, prior stroke, Alzheimer's, and fall yesterday who presents with altered mental status.  According to triage team, patient was brought in by a friend and dropped off for altered mental status.  Patient was reportedly hypoxic with oxygen saturation in the 80s and was acting confused.  Patient had pinpoint pupils on arrival.  Patient was given oxygen and her oxygen saturations improved and she was able to provide more story.  She reports she had a fall last night hitting her head and hurting her left wrist.  She is right-handed.  She reports having severe pain in her left wrist and left hand.  She reports aside from that she has also had chills, dry cough, and  denies urinary symptoms.  Intake documentation reports that the person who dropped her off/friend reports that she has a history of heroin use.  Patient would not answer question when asked about heroin use.  Patient also reports that she does not have any oxygen at home.  She reports that she has been taking gabapentin for pain.  On arrival, patient is hypotensive.  Initial blood pressure when I saw the patient was in the 70s and dropped into the 76B systolic.  She will give more fluids.  Initial temperature was not febrile however she will have a rectal temperature to confirm.  On exam, patient does have deformity of the left wrist with good radial pulse, sensation, and can wiggle her fingers.  Patient had tenderness in the anatomic snuffbox.  Elbow had no significant tenderness.  Chest nontender and lungs were clear.  Abdomen was nontender.  Patient moving all extremities.  Patient was boarding some headache with her fall denied significant neck pain.  Patient still acting confused.  Clinically I am concerned that patient may have polypharmacy and hypoxia due to not using oxygen  leading to her altered mental status and confusion.  Will work-up for traumatic injuries will also work-up for occult infection given the hypotension, chills, and reported cough.  Due to the hypotension and altered mental status, anticipate patient may require admission.  5:23 PM Patient is providing more information on says that she actually had a syncopal episode last night with dizziness causing her to have the fall hitting her wrist and head.  She reports feeling slightly dizzy transiently today that has improved.  She thinks the lack of oxygen may have also caused her to have the fall as she was not on oxygen last night.  Kidney function found to be elevated from baseline.  Patient's blood pressure is slightly improved and is now around 100 systolic on her third liter.  Creatinine up to 2.91 from prior with AKI.  CT  head was performed and showed age-indeterminate stroke.  Due to the dizziness that she reported having yesterday with a syncopal episode and her report of prior strokes, patient with MRI to clarify if she had a new stroke contributing to the dizziness and her syncopal episode.  Anticipate admission for the AKI, syncope, hypotension, and hypoxia.  5:52 PM Joe spoke with Dr. Melvyn Novas with orthopedic hand surgery who recommended a short arm volar splint to the left wrist.  He reports that he will either see her she is still admitted next week to the hospital or in clinic.  Splint will be placed, anticipate admission after the rest of work-up is completed.  8:36 PM MRI returned showing evidence of acute stroke.  Neurology was called who will see patient but they agreed with admission for the altered mental status, hypotension, and hypoxia.   Medicine team will admit patient for altered mental status, stroke, and hypoxia.  Final Clinical Impressions(s) / ED Diagnoses   Final diagnoses:  Altered mental status, unspecified altered mental status type  AKI (acute kidney injury) (HCC)  Hypotension, unspecified hypotension type    ED Discharge Orders    None     Clinical Impression: 1. Altered mental status, unspecified altered mental status type   2. AKI (acute kidney injury) (HCC)   3. Hypotension, unspecified hypotension type     Disposition: Admit  This note was prepared with assistance of Dragon voice recognition software. Occasional wrong-word or sound-a-like substitutions may have occurred due to the inherent limitations of voice recognition software.     Gaje Tennyson, Canary Brim, MD 01/05/19 [phone]

## 2019-01-05 NOTE — H&P (Signed)
History and Physical    Brittany [name] ZOX:[id number] DOB: 04-19-1955 DOA: 01/05/2019  PCP: Caesar Bookman, NP  Patient coming from: Brought by friend  I have personally briefly reviewed patient's old medical records in [facility]  Chief Complaint: Altered mental status  HPI: Brittany [name] is a 63 y.o. female with medical history significant for COPD, hypertension, depression/anxiety, osteoporosis, restless leg syndrome, and chronic hepatitis C s/p Mavyret treatment who is brought to the ED by a friend for altered mental status.  History limited from patient and is therefore supplemented by EDP and chart review.  Per ED documentation, patient was dropped off to the ER by a friend for altered mental status.  She was reportedly confused with hypoxia and O2 saturations in the 80s.  She reportedly had pinpoint pupils on arrival.  Oxygen improved with supplemental O2 via [place].  She had reportedly fell last night hitting her head and injuring her left wrist.  At time of admission, patient is significantly somnolent but arouses to voice and gentle stimulation to provide intermittent answers.  She is oriented to self, knows she is in [place] and that the year is 2020.  She is not able to provide further history regarding her fall.  She admits to marijuana use but denies any other illicit drug use.  She says she was previously requiring supplemental oxygen as an outpatient but became homeless and was unable to keep an oxygen tank.  She reports a history of 3 prior strokes without residual deficit but is unable to provide further details.  ED Course:  Initial vitals showed BP 85/75, pulse 105, RR 16, temp 97.5 Fahrenheit, SPO2 reportedly in the 80s per ED documentation improved to 99% on 3 L supplemental O2 via [place].  Labs notable for BUN 37, creatinine 2.91, sodium 139, potassium 3.7, bicarb 21, WBC 10.7, hemoglobin 12.8, platelets 211,000, troponin I 6x2.  VBG showed pH 7.218, PCO2 55.4, PO2  65.0.  Serum ethanol level is undetectable.  Urinalysis was negative for UTI.  UDS was positive for opiates, cocaine, amphetamines, and THC.  Ammonia 23, acetaminophen and salicylate levels undetectable.  Lactic acid 1.2.  Blood cultures were obtained and pending.  SARS-CoV-2 test was negative.  Portable chest x-ray was negative for focal consolidation, effusion, or edema.  X-ray of the left elbow and wrist showed a comminuted fracture of the distal radius and nondisplaced fracture of the ulnar styloid.  CT head and cervical spine without contrast showed a lacunar infarct in the right cerebellar hemisphere and a nonacute appearing right occipital lobe infarct, a small infarct in the right frontal lobe felt to be nonacute.  No intracranial hemorrhage or skull fracture noted.  No fracture or traumatic malalignment of the C-spine noted.  MRI brain without contrast showed a punctate acute lacunar infarct suspected in the left occipital pole without associated hemorrhage or mass-effect.  Small areas of chronic encephalomalacia with associated hemosiderin in the right frontal, right occipital, and right cerebellum are noted.  Superimposed cerebral white matter signal abnormality favored due to chronic small vessel disease also seen.  Patient was given 3 L of normal saline and started on maintenance IV fluids with improvement in blood pressure.  EDP discussed with hand surgeon, Dr. Melvyn Novas, who recommended short arm volar splint to the left wrist and will see her next week if still admitted otherwise will follow up outpatient.  Neurology were also consulted and will see the patient.  The hospitalist team was consulted to admit for further  evaluation and management.  Review of Systems:  Unable to obtain full review of systems due to excessive somnolence.   Past Medical History:  Diagnosis Date   Alzheimer disease (HCC)    Anxiety    Bilateral pneumonia 01/2017   per new patient packet    COPD  (chronic obstructive pulmonary disease) (HCC) 01/2017   per new patient packet    Depression    Fatty liver    Gallbladder disease 2014   per new patient packet    Hernia, abdominal    Hypertension    Liver damage 01/2017   per new patient packet, Dr.Kadakia   Normal cardiac stress test 2007   Ovarian cyst 1977   8 1/2 lb left ovarian cyst, Dr.Cox, per new patient packet    Pneumonia    Right ovarian cyst    1992, 1993, 1994, 1995, and 1996 Dr.Neil, per new patient packet    Stroke (HCC)    2 strokes.     Past Surgical History:  Procedure Laterality Date   ABDOMINAL HYSTERECTOMY Bilateral    Total   CESAREAN SECTION     CHOLECYSTECTOMY     INGUINAL HERNIA REPAIR Right    LEFT OOPHORECTOMY  1977   with cyst removal, age 72   MINOR EXCISION OF ORAL LESION N/A 10/07/2017   Procedure: IRRIGATION AND DEBRIDEMENT OF ORAL INFECTION, REMOVAL OF REMAINING 8 TEETH AND PLACEMENT OF PENROSE DRAINS;  Surgeon: Vivia Ewing, DMD;  Location: [facility];  Service: Oral Surgery;  Laterality: N/A;   OVARIAN CYST REMOVAL Right 93, 94,95, 96   UMBILICAL HERNIA REPAIR      Social History:  reports that she has been smoking cigarettes. She has a 34.50 pack-year smoking history. She has never used smokeless tobacco. She reports current alcohol use of about 1.0 standard drinks of alcohol per week. She reports current drug use.  Allergies  Allergen Reactions   Amoxicillin Hives     Has patient had a PCN reaction causing immediate rash, facial/tongue/throat swelling, SOB or lightheadedness with hypotension: Yes hives Has patient had a PCN reaction causing severe rash involving mucus membranes or skin necrosis: No Has patient had a PCN reaction that required hospitalization No Has patient had a PCN reaction occurring within the last 10 years: Yes If all of the above answers are "NO", then may proceed with Cephalosporin use.    Clindamycin/Lincomycin     Patient states that  clindamycin causes diarrhea to her.   Sulfonamide Derivatives Nausea And Vomiting   Wellbutrin [Bupropion] Other (See Comments)    Lightheaded Diarrhea    Family History  Problem Relation Age of Onset   Stroke Mother    COPD Mother    Dementia Mother    Breast cancer Mother    Diabetes Father    Heart attack Father    Diabetes Sister    Stroke Sister        x2   Hepatitis C Daughter    Diabetes Paternal Grandmother      Prior to Admission medications   Medication Sig Start Date End Date Taking? Authorizing Provider  albuterol (PROVENTIL HFA;VENTOLIN HFA) 108 (90 Base) MCG/ACT inhaler Inhale 2 puffs into the lungs every 6 (six) hours as needed for wheezing or shortness of breath.    [provider]  alendronate (FOSAMAX) 70 MG tablet TAKE 1 TABLET BY MOUTH WEEKLY WITH FULL GLASS OF WATER ON EMPTY STOMACH 30 MIN PRIOR TO BREAKFAST Patient taking differently: Take 70 mg by  mouth once a week. WITH FULL GLASS OF WATER AND ON EMPTY STOMACH- 30 MINS. PRIOR TO BREAKFAST 08/14/18   Ngetich, Dinah C, NP  budesonide-formoterol (SYMBICORT) 160-4.5 MCG/ACT inhaler Inhale 2 puffs into the lungs as needed.     [provider]  citalopram (CELEXA) 40 MG tablet Take 0.5 tablets (20 mg total) by mouth daily for 30 days. 05/04/18 01/05/19  Ngetich, Dinah C, NP  gabapentin (NEURONTIN) 300 MG capsule TAKE 1 CAPSULE (300 MG TOTAL) BY MOUTH 4 (FOUR) TIMES DAILY. FOR NERVE PAIN 08/02/18   Levert Feinstein, MD  HYDROcodone-acetaminophen (NORCO/VICODIN) 5-325 MG tablet Take 1-2 tablets by mouth every 4 (four) hours as needed. 09/15/18   Rolan Bucco, MD  KLOR-CON M10 10 MEQ tablet Take 10 mEq by mouth once a week. On Wednesday 08/12/17   [provider]  losartan (COZAAR) 100 MG tablet Take 50 mg by mouth daily. 11/10/18   [provider]  losartan (COZAAR) 50 MG tablet Take 1 tablet (50 mg total) daily by mouth. 02/02/17   Vassie Loll, MD  nitroGLYCERIN (NITROSTAT)  0.4 MG SL tablet Place 1 tablet (0.4 mg total) under the tongue every 5 (five) minutes x 3 doses as needed for chest pain (chest pain). 02/18/17   Orpah Cobb, MD  ondansetron (ZOFRAN-ODT) 4 MG disintegrating tablet Take 4 mg by mouth daily as needed for nausea or vomiting.  08/09/17   [provider]  rOPINIRole (REQUIP) 0.25 MG tablet TAKE 1 TABLET (0.25 MG TOTAL) BY MOUTH AT BEDTIME. 06/07/18   Ngetich, Donalee Citrin, NP    Physical Exam: Vitals:   01/05/19 1700 01/05/19 1730 01/05/19 1800 01/05/19 2000  BP: 96/68 105/70 (!) 90/57 114/69  Pulse: 100 (!) 101 (!) 101   Resp: Temp:      TempSrc:      SpO2: 99% 99% 98%   Weight:      Height:       Limited due to excessive somnolence. Constitutional: Somnolent, resting supine in bed Eyes: PERRL, not following for formal EOM testing, lids and conjunctivae normal ENMT: Mucous membranes are moist. Posterior pharynx clear of any exudate or lesions. Neck: normal, supple, no masses. Respiratory: clear to auscultation bilaterally, no wheezing, no crackles. Normal respiratory effort. No accessory muscle use.  Cardiovascular: Regular rate and rhythm, no murmurs / rubs / gallops. No extremity edema. 2+ pedal pulses. Abdomen: no tenderness, no masses palpated. No hepatosplenomegaly. Bowel sounds positive.  Musculoskeletal: Left wrist splint.  Good ROM of RUE and bilateral lower extremities, no contractures. Normal muscle tone.  Skin: no rashes, lesions, ulcers. No induration Neurologic: CN 2-12 grossly intact. Sensation intact, Strength 5/5 in RUE, LLE, RLE, not assessed in LUE due to splinted left wrist fracture Psychiatric: Excessively somnolent but awakens to voice and gentle stimulation.  She is oriented to self, city, and year.  She is unable to provide further details regarding presentation.   Labs on Admission: I have personally reviewed following labs and imaging studies  CBC: Recent Labs  Lab 01/05/19 1504  01/05/19 1509  WBC 10.7*  --   NEUTROABS 6.4  --   HGB 12.8 12.9  HCT 39.3 38.0  MCV 90.1  --   PLT 211  --    Basic Metabolic Panel: Recent Labs  Lab 01/05/19 1504 01/05/19 1509  NA 139 140  K 3.7 3.7  CL 104  --   CO2 21*  --   GLUCOSE 114*  --   BUN  37*  --   CREATININE 2.91*  --   CALCIUM 9.1  --    GFR: Estimated Creatinine Clearance: 16.3 mL/min (A) (by C-G formula based on SCr of 2.91 mg/dL (H)). Liver Function Tests: No results for input(s): AST, ALT, ALKPHOS, BILITOT, PROT, ALBUMIN in the last 168 hours. No results for input(s): LIPASE, AMYLASE in the last 168 hours. Recent Labs  Lab 01/05/19 1607  AMMONIA 23   Coagulation Profile: Recent Labs  Lab 01/05/19 1607  INR 1.1   Cardiac Enzymes: No results for input(s): CKTOTAL, CKMB, CKMBINDEX, TROPONINI in the last 168 hours. BNP (last 3 results) No results for input(s): PROBNP in the last 8760 hours. HbA1C: No results for input(s): HGBA1C in the last 72 hours. CBG: No results for input(s): GLUCAP in the last 168 hours. Lipid Profile: No results for input(s): CHOL, HDL, LDLCALC, TRIG, CHOLHDL, LDLDIRECT in the last 72 hours. Thyroid Function Tests: No results for input(s): TSH, T4TOTAL, FREET4, T3FREE, THYROIDAB in the last 72 hours. Anemia Panel: No results for input(s): VITAMINB12, FOLATE, FERRITIN, TIBC, IRON, RETICCTPCT in the last 72 hours. Urine analysis:    Component Value Date/Time   COLORURINE YELLOW 01/05/2019 1607   APPEARANCEUR HAZY (A) 01/05/2019 1607   LABSPEC 1.017 01/05/2019 1607   PHURINE 5.0 01/05/2019 1607   GLUCOSEU NEGATIVE 01/05/2019 1607   HGBUR SMALL (A) 01/05/2019 1607   BILIRUBINUR NEGATIVE 01/05/2019 1607   KETONESUR 5 (A) 01/05/2019 1607   PROTEINUR 30 (A) 01/05/2019 1607   UROBILINOGEN 0.2 07/11/2013 1933   NITRITE NEGATIVE 01/05/2019 1607   LEUKOCYTESUR NEGATIVE 01/05/2019 1607    Radiological Exams on Admission: Dg Chest 1 View  Result Date:  01/05/2019 CLINICAL DATA:  63 year old female with hypoxia. EXAM: CHEST  1 VIEW COMPARISON:  Chest radiograph dated 02/16/2017 FINDINGS: The lungs are clear. There is no pleural effusion or pneumothorax. The cardiac silhouette is within normal limits. Atherosclerotic calcification of the aortic arch. No acute osseous pathology. IMPRESSION: No active cardiopulmonary disease. Electronically Signed   By: Elgie Collard M.D.   On: 01/05/2019 15:32   Dg Elbow 2 Views Left  Result Date: 01/05/2019 CLINICAL DATA:  63 year old female with fall and trauma to the left upper extremity. EXAM: LEFT WRIST - COMPLETE 3+ VIEW; LEFT ELBOW - 2 VIEW COMPARISON:  None. FINDINGS: There is a comminuted and minimally angulated fracture of distal radius. There is a transverse component of the fracture through the distal radius and a longitudinal component which appears to extend into the articular surface. There is minimal dorsal angulation of the distal fracture fragment. Nondisplaced fracture of the ulnar styloid. No other acute fracture identified. There is no acute fracture or dislocation at the elbow. The bones are osteopenic. There is no dislocation. There is mild soft tissue swelling of the wrist. No radiopaque foreign object or soft tissue gas. IMPRESSION: 1. Comminuted fracture of the distal radius and nondisplaced fracture of the ulnar styloid. 2. No acute fracture or dislocation of the elbow. Electronically Signed   By: Elgie Collard M.D.   On: 01/05/2019 15:36   Dg Wrist Complete Left  Result Date: 01/05/2019 CLINICAL DATA:  63 year old female with fall and trauma to the left upper extremity. EXAM: LEFT WRIST - COMPLETE 3+ VIEW; LEFT ELBOW - 2 VIEW COMPARISON:  None. FINDINGS: There is a comminuted and minimally angulated fracture of distal radius. There is a transverse component of the fracture through the distal radius and a longitudinal component which appears to extend into the articular  surface. There is  minimal dorsal angulation of the distal fracture fragment. Nondisplaced fracture of the ulnar styloid. No other acute fracture identified. There is no acute fracture or dislocation at the elbow. The bones are osteopenic. There is no dislocation. There is mild soft tissue swelling of the wrist. No radiopaque foreign object or soft tissue gas. IMPRESSION: 1. Comminuted fracture of the distal radius and nondisplaced fracture of the ulnar styloid. 2. No acute fracture or dislocation of the elbow. Electronically Signed   By: Elgie Collard M.D.   On: 01/05/2019 15:36   Ct Head Wo Contrast  Result Date: 01/05/2019 CLINICAL DATA:  Altered level of consciousness.  C-spine trauma. EXAM: CT HEAD WITHOUT CONTRAST CT CERVICAL SPINE WITHOUT CONTRAST TECHNIQUE: Multidetector CT imaging of the head and cervical spine was performed following the standard protocol without intravenous contrast. Multiplanar CT image reconstructions of the cervical spine were also generated. COMPARISON:  July 11, 2013 FINDINGS: CT HEAD FINDINGS Brain: No subdural, epidural, or subarachnoid hemorrhage. Ventricles and sulci are normal. A lacunar infarct is seen in the right cerebellum on series 4, image 8, new since 2015. Remainder of the cerebellum is normal. The brainstem and basal cisterns are normal. Patchy low-attenuation in the right occipital lobe involving the cortex is consistent with sequela of infarct, nonacute in appearance. There is a small infarct in the right frontal lobe, best seen on coronal image 29. This also has a nonacute appearance. No acute ischemia or infarct is identified. No mass effect or midline shift identified. Vascular: Calcified atherosclerosis is seen in the intracranial carotids. Skull: Normal. Negative for fracture or focal lesion. Sinuses/Orbits: No acute finding. Other: None. CT CERVICAL SPINE FINDINGS Alignment: Normal. Skull base and vertebrae: No acute fracture. No primary bone lesion or focal pathologic  process. Soft tissues and spinal canal: No prevertebral fluid or swelling. No visible canal hematoma. Disc levels:  Focal degenerative changes at C6-7. Upper chest: Negative. Other: No other abnormalities are identified. IMPRESSION: 1. There is a lacunar infarct in the right cerebellar hemisphere, patchy low-attenuation in the right occipital lobe involving cortex most consistent with nonacute infarct, and a small infarct in the right frontal lobe favored to be nonacute as well. No acute intracranial abnormalities identified. 2. No fracture or traumatic malalignment in the cervical spine. Electronically Signed   By: Gerome Sam III M.D   On: 01/05/2019 16:34   Ct Cervical Spine Wo Contrast  Result Date: 01/05/2019 CLINICAL DATA:  Altered level of consciousness.  C-spine trauma. EXAM: CT HEAD WITHOUT CONTRAST CT CERVICAL SPINE WITHOUT CONTRAST TECHNIQUE: Multidetector CT imaging of the head and cervical spine was performed following the standard protocol without intravenous contrast. Multiplanar CT image reconstructions of the cervical spine were also generated. COMPARISON:  July 11, 2013 FINDINGS: CT HEAD FINDINGS Brain: No subdural, epidural, or subarachnoid hemorrhage. Ventricles and sulci are normal. A lacunar infarct is seen in the right cerebellum on series 4, image 8, new since 2015. Remainder of the cerebellum is normal. The brainstem and basal cisterns are normal. Patchy low-attenuation in the right occipital lobe involving the cortex is consistent with sequela of infarct, nonacute in appearance. There is a small infarct in the right frontal lobe, best seen on coronal image 29. This also has a nonacute appearance. No acute ischemia or infarct is identified. No mass effect or midline shift identified. Vascular: Calcified atherosclerosis is seen in the intracranial carotids. Skull: Normal. Negative for fracture or focal lesion. Sinuses/Orbits: No acute finding. Other: None. CT  CERVICAL SPINE FINDINGS  Alignment: Normal. Skull base and vertebrae: No acute fracture. No primary bone lesion or focal pathologic process. Soft tissues and spinal canal: No prevertebral fluid or swelling. No visible canal hematoma. Disc levels:  Focal degenerative changes at C6-7. Upper chest: Negative. Other: No other abnormalities are identified. IMPRESSION: 1. There is a lacunar infarct in the right cerebellar hemisphere, patchy low-attenuation in the right occipital lobe involving cortex most consistent with nonacute infarct, and a small infarct in the right frontal lobe favored to be nonacute as well. No acute intracranial abnormalities identified. 2. No fracture or traumatic malalignment in the cervical spine. Electronically Signed   By: Gerome Samavid  Williams III M.D   On: 01/05/2019 16:34   Mr Brain Wo Contrast  Result Date: 01/05/2019 CLINICAL DATA:  63 year old female with altered mental status. EXAM: MRI HEAD WITHOUT CONTRAST TECHNIQUE: Multiplanar, multiecho pulse sequences of the brain and surrounding structures were obtained without intravenous contrast. COMPARISON:  Head and cervical spine CT earlier today. FINDINGS: Study is intermittently degraded by motion artifact despite repeated imaging attempts. Brain: There is a small 4 millimeter focus of restricted diffusion in the left occipital pole on series 2, image 25. Faint associated T2 and FLAIR hyperintensity. Mildly noisy diffusion imaging elsewhere, with no other convincing restricted diffusion or acute infarction. Chronic encephalomalacia with facilitated diffusion in the right occipital lobe, and similar small chronic infarct in the right cerebellum. Likewise, small area of chronic encephalomalacia in the anterior right middle frontal gyrus with facilitated diffusion. These correspond to the earlier CT and there is associated hemosiderin at each of these sites. There is also a tiny area of cortical encephalomalacia at the right superior motor strip on series 7, image  24. And also mild chronic hemosiderin along the right anterior inferior frontal gyrus. Patchy bilateral cerebral white matter T2 and FLAIR hyperintensity. Some areas resemble cystic encephalomalacia. No other chronic cerebral blood products. Only minor T2 heterogeneity in the right caudate. Negative brainstem. No midline shift, mass effect, evidence of mass lesion, ventriculomegaly, extra-axial collection or acute intracranial hemorrhage. Cervicomedullary junction and pituitary are within normal limits. Vascular: Major intracranial vascular flow voids are preserved. Skull and upper cervical spine: Negative visible cervical spine. Visualized bone marrow signal is within normal limits. Sinuses/Orbits: Negative orbits. Trace paranasal sinus mucosal thickening. Other: Mastoids are clear. Visible internal auditory structures appear normal. Scalp and face soft tissues appear negative. IMPRESSION: 1. Punctate acute lacunar infarct suspected in the left occipital pole. No associated hemorrhage or mass effect. 2. Small areas of chronic encephalomalacia with associated hemosiderin in the right frontal, right occipital, and right cerebellum corresponding to the CT findings earlier today. 3. Superimposed cerebral white matter signal abnormality, favored due to chronic small vessel disease. Electronically Signed   By: Odessa FlemingH  Hall M.D.   On: 01/05/2019 20:24    EKG: Independently reviewed. Sinus tachycardia, rate 108, motion artifact without acute ischemic changes.  Not significantly changed when compared to prior 2018.  Assessment/Plan Active Problems:   COPD (chronic obstructive pulmonary disease) (HCC)   Depression   Lacunar infarct, acute (HCC)   Altered mental status associated with intoxication (HCC)   Hypotension   Left wrist fracture   AKI (acute kidney injury) (HCC)  Brittany [name] is a 63 y.o. female with medical history significant for COPD, hypertension, depression/anxiety, osteoporosis, restless leg  syndrome, and chronic hepatitis C s/p Mavyret treatment who is admitted with altered mental status and syncope/fall due to intoxication found to have an acute lacunar  infarct, AKI, and left wrist fracture.  Altered mental status and syncope with fall due to intoxication: Patient admits to marijuana use, UDS positive for THC, amphetamines, cocaine, and opiates.  Substance use likely etiology of fall.  She remains somnolent but is becoming more alert compared to ED arrival. -Continue IV fluid resuscitation with D5-1/2 NS overnight -Fall precautions  Acute lacunar infarct in left occipital pole: Seen on MRI brain in addition to areas of chronic encephalomalacia. Possibly secondary to hypoperfusion in the setting of hypertension. -Start aspirin 81 mg and Plavix 75 mg daily -Monitor on telemetry -Continue IV fluids to maintain blood pressure -Start atorvastatin 40 mg daily -Check carotid Dopplers and echocardiogram -PT/OT/SLP eval -Check A1c and lipid panel -Neurology to follow  Acute kidney injury: Likely prerenal from hypotension/hypovolemia.  Home medications also lists losartan however unclear if she has been taking this regularly.  S/p 3 L normal saline in the ED. -Continue D5-1/2NS overnight and follow labs  Hypotension: Suspect secondary to intoxication and hypovolemia.  BP responding well to IV fluid resuscitation. -Continue IV fluids overnight as above -Hold losartan  COPD: Patient hypoxic on arrival with respiratory acidosis likely from respiratory depression in setting of substance use.  O2 saturations improved with supplemental O2.  Patient states she has required oxygen at home in the past however is no longer using due to her social situation.  She has no active wheezing or evidence of COPD exacerbation on admission. -Continue supplemental O2 as needed -Continue Dulera and as needed albuterol  Left wrist fracture: Comminuted fracture of the distal radius and nondisplaced  fracture of the ulnar styloid occurring after fall.  EDP discussed with hand surgery, Dr. Caralyn Guile, who recommended short arm volar splint to the left wrist and will see her next week if still admitted otherwise will follow up outpatient.  Depression/anxiety: Previously on Celexa, unclear if she still taking it.  Will hold for now.  Homelessness: Patient reports becoming homeless approximately 6 months ago.  Will request social work consultation for further assistance.  DVT prophylaxis: Subcutaneous heparin Code Status: Full code, based on prior Family Communication: Attempted to call Sister Lenda Kelp at [phone] as listed in demographics without answer Disposition Plan: Pending clinical progress Consults called: Neurology, hand surgery Admission status: Admit - It is my clinical opinion that admission to INPATIENT is reasonable and necessary because of the expectation that this patient will require hospital care that crosses at least 2 midnights to treat this condition based on the medical complexity of the problems presented.  Given the aforementioned information, the predictability of an adverse outcome is felt to be significant.   Zada Finders MD [facility]  If 7PM-7AM, please contact night-coverage [email or web address]  01/05/2019, 8:51 PM

## 2019-01-05 NOTE — ED Triage Notes (Signed)
Pt was dropped off on sidewalk by friend.  They stated she called them b/c she feel and hurt her L wrist.  Pt altered, bp 80/50.  L wrist deformity.

## 2019-01-05 NOTE — Progress Notes (Signed)
Orthopedic Tech Progress Note Patient Details:  Brittany [name] Jun 13, 1955 [id number]  Ortho Devices Type of Ortho Device: Arm sling, Volar splint Ortho Device/Splint Location: lue Ortho Device/Splint Interventions: Ordered, Application, Adjustment  I was requested to apply splint in place.   Post Interventions Patient Tolerated: Well Instructions Provided: Care of device, Adjustment of device   Karolee Stamps 01/05/2019, 9:30 PM

## 2019-01-06 ENCOUNTER — Inpatient Hospital Stay (HOSPITAL_COMMUNITY): Payer: Medicare HMO

## 2019-01-06 DIAGNOSIS — G629 Polyneuropathy, unspecified: Secondary | ICD-10-CM

## 2019-01-06 DIAGNOSIS — R4182 Altered mental status, unspecified: Secondary | ICD-10-CM

## 2019-01-06 DIAGNOSIS — I63532 Cerebral infarction due to unspecified occlusion or stenosis of left posterior cerebral artery: Secondary | ICD-10-CM

## 2019-01-06 DIAGNOSIS — I639 Cerebral infarction, unspecified: Secondary | ICD-10-CM

## 2019-01-06 DIAGNOSIS — I6389 Other cerebral infarction: Secondary | ICD-10-CM

## 2019-01-06 LAB — LIPID PANEL
Cholesterol: 127 mg/dL (ref 0–200)
HDL: 29 mg/dL — ABNORMAL LOW (ref >40)
LDL Cholesterol: 82 mg/dL (ref 0–99)
Total CHOL/HDL Ratio: 4.4 RATIO
Triglycerides: 79 mg/dL (ref <150)
VLDL: 16 mg/dL (ref 0–40)

## 2019-01-06 LAB — HEPATIC FUNCTION PANEL
ALT: 11 U/L (ref 0–44)
AST: 13 U/L — ABNORMAL LOW (ref 15–41)
Albumin: 3.2 g/dL — ABNORMAL LOW (ref 3.5–5.0)
Alkaline Phosphatase: 59 U/L (ref 38–126)
Bilirubin, Direct: 0.1 mg/dL (ref 0.0–0.2)
Indirect Bilirubin: 0.5 mg/dL (ref 0.3–0.9)
Total Bilirubin: 0.6 mg/dL (ref 0.3–1.2)
Total Protein: 5.2 g/dL — ABNORMAL LOW (ref 6.5–8.1)

## 2019-01-06 LAB — BASIC METABOLIC PANEL
Anion gap: 9 (ref 5–15)
BUN: 27 mg/dL — ABNORMAL HIGH (ref 6–20)
CO2: 21 mmol/L — ABNORMAL LOW (ref 22–32)
Calcium: 8.3 mg/dL — ABNORMAL LOW (ref 8.9–10.3)
Chloride: 110 mmol/L (ref 98–111)
Creatinine, Ser: 1.05 mg/dL — ABNORMAL HIGH (ref 0.44–1.00)
GFR calc Af Amer: >60 mL/min (ref >60)
GFR calc non Af Amer: 58 mL/min — ABNORMAL LOW (ref >60)
Glucose, Bld: 91 mg/dL (ref 70–99)
Potassium: 3.2 mmol/L — ABNORMAL LOW (ref 3.5–5.1)
Sodium: 140 mmol/L (ref 135–145)

## 2019-01-06 LAB — CBC
HCT: 32.4 % — ABNORMAL LOW (ref 36.0–46.0)
Hemoglobin: 10.7 g/dL — ABNORMAL LOW (ref 12.0–15.0)
MCH: 29.7 pg (ref 26.0–34.0)
MCHC: 33 g/dL (ref 30.0–36.0)
MCV: 90 fL (ref 80.0–100.0)
Platelets: 140 10*3/uL — ABNORMAL LOW (ref 150–400)
RBC: 3.6 MIL/uL — ABNORMAL LOW (ref 3.87–5.11)
RDW: 14.6 % (ref 11.5–15.5)
WBC: 5.5 10*3/uL (ref 4.0–10.5)
nRBC: 0 % (ref 0.0–0.2)

## 2019-01-06 LAB — URINE CULTURE: Culture: NO GROWTH

## 2019-01-06 LAB — HIV ANTIBODY (ROUTINE TESTING W REFLEX): HIV Screen 4th Generation wRfx: NONREACTIVE

## 2019-01-06 LAB — ECHOCARDIOGRAM COMPLETE
Height: 62 in
Weight: 1919.94 oz

## 2019-01-06 MED ORDER — POTASSIUM CHLORIDE 10 MEQ/100ML IV SOLN
10.0000 meq | INTRAVENOUS | Status: DC
  Filled 2019-01-06: qty 100

## 2019-01-06 MED ORDER — CLOPIDOGREL BISULFATE 75 MG PO TABS: 75.0000 mg | ORAL_TABLET | Freq: Every day | ORAL | 0 refills | Status: DC

## 2019-01-06 MED ORDER — POTASSIUM CHLORIDE CRYS ER 20 MEQ PO TBCR
30.0000 meq | EXTENDED_RELEASE_TABLET | Freq: Once | ORAL | Status: AC
  Administered 2019-01-06: 30 meq via ORAL
  Filled 2019-01-06: qty 1

## 2019-01-06 MED ORDER — ATORVASTATIN CALCIUM 20 MG PO TABS: 20.0000 mg | ORAL_TABLET | Freq: Every day | ORAL | 0 refills | Status: DC

## 2019-01-06 MED ORDER — ATORVASTATIN CALCIUM 10 MG PO TABS
20.0000 mg | ORAL_TABLET | Freq: Every day | ORAL | Status: DC
  Administered 2019-01-06: 20 mg via ORAL
  Filled 2019-01-06: qty 2

## 2019-01-06 MED ORDER — ASPIRIN 81 MG PO TBEC: 81.0000 mg | DELAYED_RELEASE_TABLET | Freq: Every day | ORAL | 0 refills | Status: DC

## 2019-01-06 NOTE — Progress Notes (Signed)
  Echocardiogram 2D Echocardiogram has been performed.  Brittany [name] 01/06/2019, 1:09 PM

## 2019-01-06 NOTE — Evaluation (Signed)
Physical Therapy Evaluation Patient Details Name: Brittany [name] MRN: [id number] DOB: 11-Dec-1955 Today's Date: 01/06/2019   History of Present Illness  Pt is a 63 y.o. female who presented to the ED wtih AMS and hypoxia. Additionally, she had fallen the frevious night hitting her head and suffering L comminuted fracture of the distal radius and non-displaced fracture of ulnar styloid. She was found to have acute lacunar infarct in the left occipital pole.     Clinical Impression  Pt presents to PT with remarkable improvement in mentation and function vs earlier OT exam.  Mild limitations due to LUE pain and guarding, RLE pain/restricted ROM/sensory changes, and mild higher level balance problems resulting in increased fall risk in distracting situations or on uneven surfaces, and limitations in ability to ambulate over a period of time.  Closer exam of R ankle/foot revealed significant weakness vs joint restriction lacking approx 20-30 degree of dorsiflexion (ie cannot actively or passively achieve neutral position for standing and impaired gait lacking toe-off on RLE.  Pt reports numbness at forefoot and midfoot of plantar surface (in addition to premorbid nerve pain), may benefit from imaging or nerve conduction study.   Pt's d/c is complicated as she is reportedly homeless, but at this time, no formal PT follow up is expected. PT will follow acutely to address deficits in gait and associated impairments, but there is limited potential benefit in this setting due to typically short LOS.      Follow Up Recommendations No PT follow up    Equipment Recommendations  None recommended by PT    Recommendations for Other Services       Precautions / Restrictions Precautions Precautions: Fall Required Braces or Orthoses: Sling Restrictions Weight Bearing Restrictions: Yes LUE Weight Bearing: Non weight bearing Other Position/Activity Restrictions: Volar splint and sling      Mobility  Bed  Mobility Overal bed mobility: Independent Bed Mobility: Supine to Sit     Supine to sit: Modified independent (Device/Increase time)     General bed mobility comments: struggles but able to move to/from EOB unassisted while guarding L arm. declines use of sling  Transfers Overall transfer level: Modified independent Equipment used: None Transfers: Sit to/from Stand Sit to Stand: Modified independent (Device/Increase time)         General transfer comment: pt slightly unsteady, self corrects, otherwise no assist needed  Ambulation/Gait Ambulation/Gait assistance: Supervision Gait Distance (Feet): 100 Feet Assistive device: None Gait Pattern/deviations: Step-through pattern     General Gait Details: walks and talks without pause suggesting dual task functioning intact.  distraction or rapid head turn caused LOB to opp side, selfcorrected, did not reproduce; limited by RLE ankle restrictions and pain/numbness in foot  Stairs            Wheelchair Mobility    Modified Rankin (Stroke Patients Only)       Balance Overall balance assessment: Mild deficits observed, not formally tested Sitting-balance support: No upper extremity supported;Feet supported Sitting balance-Leahy Scale: Good     Standing balance support: No upper extremity supported;During functional activity Standing balance-Leahy Scale: Good Standing balance comment: Relied on upper extremity support from OT                             Pertinent Vitals/Pain Pain Assessment: Faces Faces Pain Scale: Hurts whole lot Pain Location: L wrist Pain Descriptors / Indicators: Aching;Guarding Pain Intervention(s): Limited activity within patient's tolerance;Repositioned(offered ice, pt adamatly refused)  Home Living Family/patient expects to be discharged to:: Shelter/Homeless                 Additional Comments: Pt initially reporting homelessness and then that she has been staying in  hotels with various people. She reported that she had a place lined up in November but was very confused and unable to provide accurate report.     Prior Function Level of Independence: Independent with assistive device(s)(uses crutch/crutches)         Comments: Assume independence, she does report use of a crutch and decreased sensation RLE; unable to report PLOF     Hand Dominance        Extremity/Trunk Assessment   Upper Extremity Assessment Upper Extremity Assessment: Defer to OT evaluation RUE Deficits / Details: Generally weak LUE Deficits / Details: Wiggling digits, able to flex/extend elbow LUE: Unable to fully assess due to immobilization;Unable to fully assess due to pain    Lower Extremity Assessment Lower Extremity Assessment: Generalized weakness;RLE deficits/detail RLE Deficits / Details: history ankle/foot ?injury resulting in numbness across plantar surface, limited strength/ROM at ankle lacking approximately 30 degrees with bias to plantar flexion.  Limited in PROM to -20-25 degrees by estimation, with increased tenderness across foot when it's firmly palpated RLE Sensation: decreased light touch(ball of foot, posterior aspect of arch of foot, less at heel)       Communication   Communication: No difficulties  Cognition Arousal/Alertness: Awake/alert Behavior During Therapy: WFL for tasks assessed/performed Overall Cognitive Status: Within Functional Limits for tasks assessed Area of Impairment: Orientation;Attention;Memory;Following commands;Safety/judgement;Awareness;Problem solving                 Orientation Level: Disoriented to;Time;Situation;Place Current Attention Level: Sustained Memory: Decreased recall of precautions;Decreased short-term memory Following Commands: Follows multi-step commands consistently Safety/Judgement: Decreased awareness of safety;Decreased awareness of deficits Awareness: Anticipatory Problem Solving: Slow  processing;Decreased initiation;Difficulty sequencing;Requires verbal cues;Requires tactile cues General Comments: pt vastly improved mentation this afternoon vs. this morning.  able to provide history, follow conversations, follow commands      General Comments General comments (skin integrity, edema, etc.): exam truncated due to procedure; instruction on ankle AROM and standing supported achilles stretch, but needs reinforcement    Exercises     Assessment/Plan    PT Assessment Patient needs continued PT services  PT Problem List Pain;Impaired sensation;Decreased mobility;Decreased balance;Decreased activity tolerance;Decreased range of motion;Decreased strength       PT Treatment Interventions Gait training;Functional mobility training;DME instruction;Therapeutic activities;Therapeutic exercise;Balance training;Modalities    PT Goals (Current goals can be found in the Care Plan section)  Acute Rehab PT Goals Patient Stated Goal: to get her own place PT Goal Formulation: With patient Time For Goal Achievement: 01/20/19 Potential to Achieve Goals: Good    Frequency Min 3X/week   Barriers to discharge Inaccessible home environment;Decreased caregiver support homeless/occassionally stays with sister    Co-evaluation               AM-PAC PT "6 Clicks" Mobility  Outcome Measure Help needed turning from your back to your side while in a flat bed without using bedrails?: None Help needed moving from lying on your back to sitting on the side of a flat bed without using bedrails?: None Help needed moving to and from a bed to a chair (including a wheelchair)?: None Help needed standing up from a chair using your arms (e.g., wheelchair or bedside chair)?: None Help needed to walk in hospital room?: None Help needed climbing  3-5 steps with a railing? : A Little 6 Click Score: 23    End of Session   Activity Tolerance: Patient tolerated treatment well;Patient limited by  pain Patient left: in bed;with call bell/phone within reach;with family/visitor present(card tech for procedure) Nurse Communication: Mobility status PT Visit Diagnosis: Repeated falls (R29.6);Muscle weakness (generalized) (M62.81);Difficulty in walking, not elsewhere classified (R26.2);Pain Pain - Right/Left: Left Pain - part of body: Arm    Time: [id number] PT Time Calculation (min) (ACUTE ONLY): 24 min   Charges:   PT Evaluation $PT Eval Low Complexity: 1 Low PT Treatments $Therapeutic Exercise: 8-22 mins        Narda AmberJennifer Anavictoria Wilk, PT, DPT, MS Board Certified Geriatric Clinical Specialist   Dennis BastMartin, Amil Moseman Galloway 01/06/2019, 2:51 PM

## 2019-01-06 NOTE — Progress Notes (Signed)
Carotid artery duplex and bilateral lower extremity venous duplex has been completed. Preliminary results can be found in CV Proc through chart review.   01/06/19 2:58 PM Carlos Levering RVT

## 2019-01-06 NOTE — Progress Notes (Addendum)
STROKE TEAM PROGRESS NOTE   INTERVAL HISTORY No family is at the bedside. Pt stated that she does not know why her BP and O2 were so low yesterday. Once I told her that it is likely due to her taking cocaine, THC, amphetamine and opiates, and they admitted that she probably took too much. She also complains of left wrist pain due to fracture and her b/l foot chronic tingling. CUS and LE venous doppler pending   OBJECTIVE Vitals:   01/06/19 0700 01/06/19 0818 01/06/19 0842 01/06/19 1147  BP: 133/74  126/77 132/75  Pulse:   82 80  Resp: 17  16 16   Temp: 97.9 F (36.6 C)  98.7 F (37.1 C) 98.[name] F (36.8 C)  TempSrc: Oral  Oral Oral  SpO2: 97% 97% 99% 100%  Weight:      Height:        CBC:  Recent Labs  Lab 01/05/19 1504 01/05/19 1509 01/06/19 0628  WBC 10.7*  --  5.5  NEUTROABS 6.4  --   --   HGB 12.8 12.9 10.7*  HCT 39.3 38.0 32.4*  MCV 90.1  --  90.0  PLT 211  --  140*    Basic Metabolic Panel:  Recent Labs  Lab 01/05/19 1504 01/05/19 1509 01/06/19 0628  NA 139 140 140  K 3.7 3.7 3.[name]*  CL 104  --  110  CO2 21*  --  21*  GLUCOSE 114*  --  91  BUN 37*  --  27*  CREATININE [name].91*  --  1.05*  CALCIUM 9.1  --  8.3*    Lipid Panel:     Component Value Date/Time   CHOL 127 01/06/2019 0628   TRIG 79 01/06/2019 0628   HDL 29 (L) 01/06/2019 0628   CHOLHDL 4.4 01/06/2019 0628   VLDL 16 01/06/2019 0628   LDLCALC 82 01/06/2019 0628   HgbA1c:  Lab Results  Component Value Date   HGBA1C 5.6 05/16/2017   Urine Drug Screen:     Component Value Date/Time   LABOPIA POSITIVE (A) 01/05/2019 1607   COCAINSCRNUR POSITIVE (A) 01/05/2019 1607   LABBENZ NONE DETECTED 01/05/2019 1607   AMPHETMU POSITIVE (A) 01/05/2019 1607   THCU POSITIVE (A) 01/05/2019 1607   LABBARB NONE DETECTED 01/05/2019 1607    Alcohol Level     Component Value Date/Time   ETH <10 01/05/2019 1504    IMAGING  Dg Chest 1 View 01/05/2019 IMPRESSION:  No active cardiopulmonary disease.    Dg Elbow [name] Views Left 01/05/2019 IMPRESSION:  1. Comminuted fracture of the distal radius and nondisplaced fracture of the ulnar styloid.  [name]. No acute fracture or dislocation of the elbow.   Dg Wrist Complete Left 01/05/2019 IMPRESSION:  1. Comminuted fracture of the distal radius and nondisplaced fracture of the ulnar styloid.  [name]. No acute fracture or dislocation of the elbow.   Ct Head Wo Contrast 01/05/2019 IMPRESSION:  1. There is a lacunar infarct in the right cerebellar hemisphere, patchy low-attenuation in the right occipital lobe involving cortex most consistent with nonacute infarct, and a small infarct in the right frontal lobe favored to be nonacute as well. No acute intracranial abnormalities identified.    Ct Cervical Spine Wo Contrast 01/05/2019 IMPRESSION:  No fracture or traumatic malalignment in the cervical spine.   Mr Angio Head Wo Contrast Mr Angio Neck Wo Contrast 01/06/2019 IMPRESSION:  Severely motion degraded studies. Antegrade flow present in the major vessels in the neck. Cannot make any accurate  statement about carotid bifurcation stenosis. No intracranial large or medium vessel occlusion identified. Severely motion degraded study. There may be relatively diminished visualization of distal left PCA branches compared to the right, consistent with the small infarction in that region.   Mr Brain Wo Contrast 01/05/2019 IMPRESSION:  1. Punctate acute lacunar infarct suspected in the left occipital pole. No associated hemorrhage or mass effect.  [name]. Small areas of chronic encephalomalacia with associated hemosiderin in the right frontal, right occipital, and right cerebellum corresponding to the CT findings earlier today.  3. Superimposed cerebral white matter signal abnormality, favored due to chronic small vessel disease.    Transthoracic Echocardiogram - pending 01/06/2019  Bilateral Carotid Dopplers  Pending  Bilateral Lower Extremity Venous  Dopplers - pending   ECG - ST rate 109 BPM. (See cardiology reading for complete details)    PHYSICAL EXAM  Temp:  [96.7 F (35.9 C)-98.9 F (37.[name] C)] 98.[name] F (36.8 C) (10/18 1147) Pulse Rate:  [80-124] 80 (10/18 1147) Resp:  [7-32] 16 (10/18 1147) BP: (68-139)/(47-95) 132/75 (10/18 1147) SpO2:  [82 %-100 %] 100 % (10/18 1147) Weight:  [54.4 kg] 54.4 kg (10/17 1530)  General - Well nourished, well developed, in no apparent distress.  Ophthalmologic - fundi not visualized due to noncooperation.  Cardiovascular - Regular rhythm and rate.  Mental Status -  Level of arousal and orientation to time, place, and person were intact. Language including expression, naming, repetition, comprehension was assessed and found intact. Fund of Knowledge was assessed and was intact.  Cranial Nerves II - XII - II - Visual field intact OU. III, IV, VI - Extraocular movements intact. V - Facial sensation intact bilaterally. VII - Facial movement intact bilaterally. VIII - Hearing & vestibular intact bilaterally. X - Palate elevates symmetrically. XI - Chin turning & shoulder shrug intact bilaterally. XII - Tongue protrusion intact.  Motor Strength - The patient's strength was normal in all extremities except left UE [name]/5 proximal and distal due to fracture and pain at left forearm.  Bulk was normal and fasciculations were absent.   Motor Tone - Muscle tone was assessed at the neck and appendages and was normal.  Reflexes - The patient's reflexes were symmetrical in all extremities and she had no pathological reflexes.  Sensory - Light touch, temperature/pinprick were assessed and were symmetrical.  Complaining of b/l distal feet tingling.   Coordination - The patient had normal movements in right hand with no ataxia or dysmetria.  Tremor was absent.  Gait and Station - deferred.   ASSESSMENT/PLAN Brittany [name] is a 63 y.o. female with history of COPD (O2 dependent), ongoing tobacco  use, Htn, Hep C, previous strokes, substance abuse, anxiety and depression presenting with AMS, hypotension, hypoxia, and a recent fall -> left distal radius with nondisplaced fracture of the ulnar styloid. She did not receive IV t-PA due to unknown time of onset.  Stroke:  Punctate acute lacunar infarct suspected in the left occipital pole - likely large vessel athero in the setting of hypotension, hypoxia secondary to illicit drug use  CT head - There is a lacunar infarct in the right cerebellar hemisphere, patchy low-attenuation in the right occipital lobe involving cortex most consistent with nonacute infarct, and a small infarct in the right frontal lobe favored to be nonacute as well.  MRI head - Punctate acute lacunar infarct suspected in the left occipital pole.   MRA head - motion degraded - There may be relatively diminished visualization of distal  left PCA branches compared to the right, consistent with the small infarction in that region.  Carotid Doppler unremarkable  2D Echo - pending  LE venous doppler no DVT  Brittany [name] - negative  LDL - 82  HgbA1c - 5.6  VTE prophylaxis - [place] Heparin  No antithrombotic prior to admission, now on aspirin 81 mg daily and clopidogrel 75 mg daily. Continue DAPT for 3 weeks and then ASA alone.  Patient counseled to be compliant with her antithrombotic medications  Ongoing aggressive stroke risk factor management  Therapy recommendations:  SNF   Disposition:  Pending  Hypotension and hypoxia due to polysubstance abuse  O2 sat 80s and BP 70s on admission  Now resolved after IVF and overnight management  Likely due to polysubstance abuse / intoxication  UDS - positive for Cocaine ; THCU ; Opiates ; amphetamines  Substance cessation education provided  Pt is willing to quit  Left wrist fracture  XR - Comminuted fracture of the distal radius and nondisplaced fracture of the ulnar styloid.  On bandage  fixation  Management as per primary team  Peripheral neuropathy and RLS  [facility]  Stated that she is on gabapentin and it is effective  She is also on requip  Continue to follow up with Dr. Terrace ArabiaYan as outpt  Hypertension  Home BP meds: Cozaar  Current BP meds: none  Stable . Permissive hypertension (OK if < 220/120) but gradually normalize in 3-5 days . Long-term BP goal normotensive  Hyperlipidemia  Home Lipid lowering medication: none   LDL 82, goal < 70  Current lipid lowering medication: Lipitor 20 mg daily - low dose statin due to relatively low level LDL  Continue statin at discharge  Tobacco abuse  Current smoker  Smoking cessation counseling provided  Pt is willing to quit  Other Stroke Risk Factors  Advanced age  ETOH use, advised to drink no more than 1 alcoholic beverage per day  Hx stroke/TIA  Family hx stroke (mother and sister)  Other Active Problems  Homeless  Hypokalemia - 3.7->3.[name]->supplemented  AKI - creatinine - [name].91->1.05  Anemia - Hb - 12.8->12.9->10.7  Hospital day # 1  Neurology will sign off. Please call with questions. Pt [facility] about 4 weeks. Thanks for the consult.   Marvel PlanJindong Antonela Freiman, MD PhD Stroke Neurology 01/06/2019 [name]:54 PM  To contact Stroke Continuity provider, please refer to [email or web address]. After hours, contact General Neurology

## 2019-01-06 NOTE — Progress Notes (Signed)
Patient alert and orientated x4, follows all commands and answering questions appropriately. Patient uses call light system appropriately when assistance is needed. Patient currently sitting up in bed eating lunch meal independently.

## 2019-01-06 NOTE — Consult Note (Signed)
Neurology Consultation Reason for Consult: Stroke Referring Physician: Myrene Galas, T  CC: Altered mental status  History is obtained from: Patient, chart  HPI: Brittany [name] is a 63 y.o. female who presented to the emergency department with altered mental status.  She was dropped off of the ER and found to have hypoxia with O2 sats in the low 80s.  She is oxygen dependent at baseline, but has not been able to keep an oxygen tank due to being homeless.  On arrival, she was hypotensive in the 52W/41L systolic and hypoxic.  Once her blood pressure was up and she was started on oxygen, she was still confused and therefore an MRI was obtained which demonstrates a small posterior infarct in the left occipital region.  Neurology has been consulted for this stroke.  LKW: Unclear tpa given?: no, unclear time of onset   ROS: Unable to assess secondary to patient's altered mental status.    Past Medical History:  Diagnosis Date  . Alzheimer disease ([place])   . Anxiety   . Bilateral pneumonia 01/2017   per new patient packet   . COPD (chronic obstructive pulmonary disease) ([place]) 01/2017   per new patient packet   . Depression   . Fatty liver   . Gallbladder disease 2014   per new patient packet   . Hernia, abdominal   . Hypertension   . Liver damage 01/2017   per new patient packet, Dr.Kadakia  . Normal cardiac stress test 2007  . Ovarian cyst 1977   8 1/2 lb left ovarian cyst, Dr.Cox, per new patient packet   . Pneumonia   . Right ovarian cyst    1992, 1993, 1994, 1995, and 1996 Dr.Neil, per new patient packet   . Stroke [facility])    2 strokes.      Family History  Problem Relation Age of Onset  . Stroke Mother   . COPD Mother   . Dementia Mother   . Breast cancer Mother   . Diabetes Father   . Heart attack Father   . Diabetes Sister   . Stroke Sister        x2  . Hepatitis C Daughter   . Diabetes Paternal Grandmother      Social History:  reports that she has been smoking  cigarettes. She has a 34.50 pack-year smoking history. She has never used smokeless tobacco. She reports current alcohol use of about 1.0 standard drinks of alcohol per week. She reports current drug use.   Exam: Current vital signs: BP 105/72 (BP Location: Right Arm)   Pulse 82   Temp 98.9 F (37.2 C) (Axillary)   Resp 16   Ht 5\' 2"  (1.575 m)   Wt 54.4 kg   SpO2 100%   BMI 21.95 kg/m  Vital signs in last 24 hours: Temp:  [96.7 F (35.9 C)-98.9 F (37.2 C)] 98.9 F (37.2 C) (10/18 0316) Pulse Rate:  [82-124] 82 (10/18 0316) Resp:  [7-32] 16 (10/18 0316) BP: (68-126)/(47-95) 105/72 (10/18 0316) SpO2:  [82 %-100 %] 100 % (10/18 0316) Weight:  [54.4 kg] 54.4 kg (10/17 1530)   Physical Exam  Constitutional: Appears well-developed and well-nourished.  Psych: Affect appropriate to situation Eyes: No scleral injection HENT: No OP obstrucion Head: Normocephalic.  Cardiovascular: Normal rate and regular rhythm.  Respiratory: Effort normal, non-labored breathing GI: Soft.  No distension. There is no tenderness.  Skin: WDI  Neuro: Mental Status: Patient is awake, alert, oriented to person, year, but not month. She  knows that she is in the hosptial. Her answers are tangential and disorganize.d  Cranial Nerves: II: Visual Fields are full. Pupils are equal, round, and reactive to light.   III,IV, VI: EOMI without ptosis or diploplia.  V: Facial sensation is symmetric to temperature VII: Facial movement is symmetric.  VIII: hearing is intact to voice X: Uvula elevates symmetrically XI: Shoulder shrug is symmetric. XII: tongue is midline without atrophy or fasciculations.  Motor: Tone is normal. Bulk is normal. 5/5 strength was present in all four extremities.  Sensory: Sensation is symmetric to light touch and temperature in the arms and legs. Cerebellar: FNF intact bilaterally   I have reviewed labs in epic and the results pertinent to this consultation are: UDS positive  for opiates, cocaine, amphetamines, THC Cr 2.91  I have reviewed the images obtained: MRI brain - small infarct in the left occipital lobe  Impression: 63 year old female with a small occipital infarct with multiple possible etiologies including decompensated small vessel disease from hypotension, cocaine use, or possibly embolic.  I suspect that her encephalopathy is multifactorial including benzodiazepine, opiate, cocaine, amphetamines, hypoxia, hypotension, AKI with stroke as a minor contributor.  Recommendations: - HgbA1c, fasting lipid panel - Frequent neuro checks - Echocardiogram - MRA head and neck without contrast - Prophylactic therapy - Antiplatelet med: asa + plavix - Risk factor modification - Telemetry monitoring - PT consult, OT consult, Speech consult - Stroke team to follow   Ritta Slot, MD [facility] [phone]  If 7pm- 7am, please page neurology on call as listed in AMION.

## 2019-01-06 NOTE — Discharge Summary (Signed)
Physician Discharge Summary  Brittany [name] NWG:[id number] DOB: Aug 15, 1955 DOA: 01/05/2019  PCP: Caesar Bookman, NP  Admit date: 01/05/2019 Discharge date: 01/06/2019  Admitted From: Brought by friend, homeless Discharged to: Patient to stay with friend for the next couple weeks and then will move to a new place around November 1 Home Health: None Equipment/Devices: None Discharge Condition: Stable  Recommendations for Outpatient Follow-up   1. Follow up with PCP in 1-2 weeks 2. Please follow up BMP/CBC 3. Follow-up BP, lisinopril held at discharge for 1 day due to AKI which was improved but not resolved at discharge 4. Ensure patient follows up with neurology  Medication Adjustments at Discharge  1. Lisinopril 50 mg daily, hold discharge for 1 day   Hospital Summary  This is a 63 year old female with past medical history of COPD, hypertension, depression, anxiety, osteoporosis, restless leg syndrome, HCV status post treatment who was brought to the ED by a friend on 10/17 for altered mental status after patient was reportedly confused and found to be hypoxic with O2 sat in the 80s and pinpoint pupils on arrival.  She had reportedly fallen night prior to admission and subsequently hit her head as well as her left wrist.  On admission patient was somnolent but arousable to voice and only oriented to self.  Patient reported she was previously requiring supplemental oxygen but became homeless and was unable to keep the oxygen tank and reports a history of 3 prior strokes with residual deficit but could not provide further details on admission.  ED vitals: 85/75, HR 105, RR 16, T 97.5, SPO2 in the 80s requiring 3 L [place].  UDS positive for opiates, cocaine, amphetamines, THC.  Covid negative.  Patient was found to have a comminuted fracture of distal radius and nondisplaced fracture of ulnar styloid.  CT head and cervical spine showed lacunar infarct in right cerebellar hemisphere and a nonacute  appearing right occipital lobe infarct with small infarct in right frontal lobe thought to be nonacute without intracranial hemorrhage or fracture.  She was given 3 L normal saline with improvement in blood pressure and was seen by Dr. Melvyn Novas, hand surgeon, who recommended a short arm splint and with follow-up outpatient.  Neurology was consulted for the strokes.  Patient was also found to have an AKI which improved with holding home losartan and receiving fluids overnight.  Social work was consulted for homelessness x6 months.  A & P   Active Problems:   COPD (chronic obstructive pulmonary disease) (HCC)   Depression   Lacunar infarct, acute (HCC)   Altered mental status associated with intoxication (HCC)   Hypotension   Left wrist fracture   AKI (acute kidney injury) (HCC)   Punctate acute lacunar infarct suspected in left occipital lobe, likely large vessel atherosclerosis in setting of hypotension, hypoxia secondary to illicit drug use MRI with punctate acute lacunar infarct in left occipital lobe.  MRA with diminished visualization of distal left PCA branches compared to right, consistent with small infarction.  Carotid Doppler unremarkable, lower extremity venous Doppler without DVT, LDL 82, HA1C 5.6. Echo unremarkable  Aspirin 81 mg daily  Plavix 75 mg daily x3 weeks and stop  Lipitor 20 mg daily  Follow-up with neurology outpatient  Left comminuted fracture of distal radius and nondisplaced fracture of ulnar styloid with bandage fixation  Follow-up with hand surgeon outpatient  Hypotension and hypoxia due to polysubstance abuse UDS positive for cocaine, THC, opiates, amphetamines  Substance cessation encouraged  Peripheral neuropathy follows  with Dr.Yan GNA on gabapentin  Follow-up palpation  Hypertension losartan held on admission due to AKI  Hold losartan for 1 more day then resume losartan 50 mg outpatient with repeat lab work and PCP  appointment  Hyperlipidemia Lipitor 20 mg daily  Tobacco use smoking cessation counseling provided  Homelessness homeless for the past 6 months.  Seen by social worker: She is staying with a friend for now but has a place secured and will be available on 11/1 roughly  Polysubstance abuse as above  Hypokalemia repleted    Code Status: Full Code Diet recommendation: Heart healthy  Case discussed with social worker and neurology prior to discharge  Consultants   Neurology  And surgery  Procedures   Echo  MRI brain  MRA head neck  CT brain   Subjective  Patient seen and examined at bedside no acute distress and resting comfortably.  No events overnight.  Tolerating p.o. intake.  States she feels better than when she presented.  Admits to having some difficulty with raising left arm which is in a soft brace.  Denies any chest pain, shortness of breath, fever, nausea, vomiting, urinary or bowel complaints, slurred speech. Otherwise ROS negative   Objective   Discharge Exam: Vitals:   01/06/19 1147 01/06/19 1613  BP: 132/75 135/90  Pulse: 80 80  Resp: 16 16  Temp: 98.2 F (36.8 C) 98.4 F (36.9 C)  SpO2: 100% 97%   Vitals:   01/06/19 0818 01/06/19 0842 01/06/19 1147 01/06/19 1613  BP:  126/77 132/75 135/90  Pulse:  82 80 80  Resp:  Temp:  98.7 F (37.1 C) 98.2 F (36.8 C) 98.4 F (36.9 C)  TempSrc:  Oral Oral Oral  SpO2: 97% 99% 100% 97%  Weight:      Height:        Physical Exam Vitals signs and nursing note reviewed.  Constitutional:      Comments: Thin  HENT:     Head: Normocephalic.      Mouth/Throat:     Mouth: Mucous membranes are moist.  Eyes:     Extraocular Movements: Extraocular movements intact.  Neck:     Musculoskeletal: Normal range of motion. No neck rigidity.  Cardiovascular:     Rate and Rhythm: Normal rate and regular rhythm.     Heart sounds: No murmur.  Abdominal:     General: Abdomen is flat.     Palpations:  Abdomen is soft.  Musculoskeletal:     Right lower leg: No edema.     Left lower leg: No edema.     Comments: Difficulty with raising left upper extremity with pain in left arm with movement but is able to raise to head level.  Has brace wrapped around left arm  Neurological:     Mental Status: She is alert.     Cranial Nerves: No cranial nerve deficit.  Psychiatric:        Mood and Affect: Mood normal.        Behavior: Behavior normal.       The results of significant diagnostics from this hospitalization (including imaging, microbiology, ancillary and laboratory) are listed below for reference.     Microbiology: Recent Results (from the past 240 hour(s))  Urine culture     Status: None   Collection Time: 01/05/19  4:07 PM   Specimen: Urine, Random  Result Value Ref Range Status   Specimen Description URINE, RANDOM  Final   Special Requests  NONE  Final   Culture   Final    NO GROWTH Performed at [facility], [address], [place], [place] [address]    Report Status 01/06/2019 FINAL  Final  SARS Coronavirus 2 by RT PCR (hospital order, performed in [facility] hospital lab) Nasopharyngeal Nasopharyngeal Swab     Status: None   Collection Time: 01/05/19  4:20 PM   Specimen: Nasopharyngeal Swab  Result Value Ref Range Status   SARS Coronavirus 2 NEGATIVE NEGATIVE Final    Comment: (NOTE) If result is NEGATIVE SARS-CoV-2 target nucleic acids are NOT DETECTED. The SARS-CoV-2 RNA is generally detectable in upper and lower  respiratory specimens during the acute phase of infection. The lowest  concentration of SARS-CoV-2 viral copies this assay can detect is 250  copies / mL. A negative result does not preclude SARS-CoV-2 infection  and should not be used as the sole basis for treatment or other  patient management decisions.  A negative result may occur with  improper specimen collection / handling, submission of specimen other  than nasopharyngeal swab, presence of  viral mutation(s) within the  areas targeted by this assay, and inadequate number of viral copies  (<250 copies / mL). A negative result must be combined with clinical  observations, patient history, and epidemiological information. If result is POSITIVE SARS-CoV-2 target nucleic acids are DETECTED. The SARS-CoV-2 RNA is generally detectable in upper and lower  respiratory specimens dur ing the acute phase of infection.  Positive  results are indicative of active infection with SARS-CoV-2.  Clinical  correlation with patient history and other diagnostic information is  necessary to determine patient infection status.  Positive results do  not rule out bacterial infection or co-infection with other viruses. If result is PRESUMPTIVE POSTIVE SARS-CoV-2 nucleic acids MAY BE PRESENT.   A presumptive positive result was obtained on the submitted specimen  and confirmed on repeat testing.  While 2019 novel coronavirus  (SARS-CoV-2) nucleic acids may be present in the submitted sample  additional confirmatory testing may be necessary for epidemiological  and / or clinical management purposes  to differentiate between  SARS-CoV-2 and other Sarbecovirus currently known to infect humans.  If clinically indicated additional testing with an alternate test  methodology [phone]) is advised. The SARS-CoV-2 RNA is generally  detectable in upper and lower respiratory sp ecimens during the acute  phase of infection. The expected result is Negative. Fact Sheet for Patients:  BoilerBrush.com.cy Fact Sheet for Healthcare Providers: https://pope.com/ This test is not yet approved or cleared by the [place] FDA and has been authorized for detection and/or diagnosis of SARS-CoV-2 by FDA under an Emergency Use Authorization (EUA).  This EUA will remain in effect (meaning this test can be used) for the duration of the COVID-19 declaration under Section  564(b)(1) of the Act, 21 U.S.C. section 360bbb-3(b)(1), unless the authorization is terminated or revoked sooner. Performed at [facility], [address], [place], [place] [address]   Blood culture (routine x 2)     Status: None (Preliminary result)   Collection Time: 01/05/19  5:00 PM   Specimen: BLOOD RIGHT HAND  Result Value Ref Range Status   Specimen Description BLOOD RIGHT HAND  Final   Special Requests   Final    BOTTLES DRAWN AEROBIC AND ANAEROBIC Blood Culture results may not be optimal due to an inadequate volume of blood received in culture bottles   Culture   Final    NO GROWTH  1 DAY Performed at [facility], [place] [address], [place], [place] [address]    Report Status PENDING  Incomplete  Blood culture (routine x 2)     Status: None (Preliminary result)   Collection Time: 01/05/19  5:05 PM   Specimen: BLOOD RIGHT WRIST  Result Value Ref Range Status   Specimen Description BLOOD RIGHT WRIST  Final   Special Requests   Final    BOTTLES DRAWN AEROBIC ONLY Blood Culture results may not be optimal due to an inadequate volume of blood received in culture bottles   Culture   Final    NO GROWTH 1 DAY Performed at [facility], [place] [address], [place], [place] [address]    Report Status PENDING  Incomplete     Labs: BNP (last 3 results) No results for input(s): BNP in the last 8760 hours. Basic Metabolic Panel: Recent Labs  Lab 01/05/19 1504 01/05/19 1509 01/06/19 0628  NA 139 140 140  K 3.7 3.7 3.2*  CL 104  --  110  CO2 21*  --  21*  GLUCOSE 114*  --  91  BUN 37*  --  27*  CREATININE 2.91*  --  1.05*  CALCIUM 9.1  --  8.3*   Liver Function Tests: Recent Labs  Lab 01/05/19 2318  AST 13*  ALT 11  ALKPHOS 59  BILITOT 0.6  PROT 5.2*  ALBUMIN 3.2*   No results for input(s): LIPASE, AMYLASE in the last 168 hours. Recent Labs  Lab 01/05/19 1607  AMMONIA 23   CBC: Recent Labs  Lab 01/05/19 1504 01/05/19 1509 01/06/19 0628  WBC  10.7*  --  5.5  NEUTROABS 6.4  --   --   HGB 12.8 12.9 10.7*  HCT 39.3 38.0 32.4*  MCV 90.1  --  90.0  PLT 211  --  140*   Cardiac Enzymes: No results for input(s): CKTOTAL, CKMB, CKMBINDEX, TROPONINI in the last 168 hours. BNP: Invalid input(s): POCBNP CBG: No results for input(s): GLUCAP in the last 168 hours. D-Dimer No results for input(s): DDIMER in the last 72 hours. Hgb A1c No results for input(s): HGBA1C in the last 72 hours. Lipid Profile Recent Labs    01/06/19 0628  CHOL 127  HDL 29*  LDLCALC 82  TRIG 79  CHOLHDL 4.4   Thyroid function studies No results for input(s): TSH, T4TOTAL, T3FREE, THYROIDAB in the last 72 hours.  Invalid input(s): FREET3 Anemia work up No results for input(s): VITAMINB12, FOLATE, FERRITIN, TIBC, IRON, RETICCTPCT in the last 72 hours. Urinalysis    Component Value Date/Time   COLORURINE YELLOW 01/05/2019 1607   APPEARANCEUR HAZY (A) 01/05/2019 1607   LABSPEC 1.017 01/05/2019 1607   PHURINE 5.0 01/05/2019 1607   GLUCOSEU NEGATIVE 01/05/2019 1607   HGBUR SMALL (A) 01/05/2019 1607   BILIRUBINUR NEGATIVE 01/05/2019 1607   KETONESUR 5 (A) 01/05/2019 1607   PROTEINUR 30 (A) 01/05/2019 1607   UROBILINOGEN 0.2 07/11/2013 1933   NITRITE NEGATIVE 01/05/2019 1607   LEUKOCYTESUR NEGATIVE 01/05/2019 1607   Sepsis Labs Invalid input(s): PROCALCITONIN,  WBC,  LACTICIDVEN Microbiology Recent Results (from the past 240 hour(s))  Urine culture     Status: None   Collection Time: 01/05/19  4:07 PM   Specimen: Urine, Random  Result Value Ref Range Status   Specimen Description URINE, RANDOM  Final   Special Requests NONE  Final   Culture   Final    NO GROWTH Performed at [facility], [address] [place], [place] [address]    Report Status 01/06/2019 FINAL  Final  SARS Coronavirus 2 by RT PCR (hospital order, performed in [facility] hospital lab) Nasopharyngeal Nasopharyngeal Swab     Status: None   Collection Time: 01/05/19   4:20 PM   Specimen: Nasopharyngeal Swab  Result Value Ref Range Status   SARS Coronavirus 2 NEGATIVE NEGATIVE Final    Comment: (NOTE) If result is NEGATIVE SARS-CoV-2 target nucleic acids are NOT DETECTED. The SARS-CoV-2 RNA is generally detectable in upper and lower  respiratory specimens during the acute phase of infection. The lowest  concentration of SARS-CoV-2 viral copies this assay can detect is 250  copies / mL. A negative result does not preclude SARS-CoV-2 infection  and should not be used as the sole basis for treatment or other  patient management decisions.  A negative result may occur with  improper specimen collection / handling, submission of specimen other  than nasopharyngeal swab, presence of viral mutation(s) within the  areas targeted by this assay, and inadequate number of viral copies  (<250 copies / mL). A negative result must be combined with clinical  observations, patient history, and epidemiological information. If result is POSITIVE SARS-CoV-2 target nucleic acids are DETECTED. The SARS-CoV-2 RNA is generally detectable in upper and lower  respiratory specimens dur ing the acute phase of infection.  Positive  results are indicative of active infection with SARS-CoV-2.  Clinical  correlation with patient history and other diagnostic information is  necessary to determine patient infection status.  Positive results do  not rule out bacterial infection or co-infection with other viruses. If result is PRESUMPTIVE POSTIVE SARS-CoV-2 nucleic acids MAY BE PRESENT.   A presumptive positive result was obtained on the submitted specimen  and confirmed on repeat testing.  While 2019 novel coronavirus  (SARS-CoV-2) nucleic acids may be present in the submitted sample  additional confirmatory testing may be necessary for epidemiological  and / or clinical management purposes  to differentiate between  SARS-CoV-2 and other Sarbecovirus currently known to infect  humans.  If clinically indicated additional testing with an alternate test  methodology [phone]) is advised. The SARS-CoV-2 RNA is generally  detectable in upper and lower respiratory sp ecimens during the acute  phase of infection. The expected result is Negative. Fact Sheet for Patients:  BoilerBrush.com.cyhttps://www.fda.gov/media/136312/download Fact Sheet for Healthcare Providers: https://pope.com/https://www.fda.gov/media/136313/download This test is not yet approved or cleared by the [place] FDA and has been authorized for detection and/or diagnosis of SARS-CoV-2 by FDA under an Emergency Use Authorization (EUA).  This EUA will remain in effect (meaning this test can be used) for the duration of the COVID-19 declaration under Section 564(b)(1) of the Act, 21 U.S.C. section 360bbb-3(b)(1), unless the authorization is terminated or revoked sooner. Performed at [facility], [address] [place], [place] [address]   Blood culture (routine x 2)     Status: None (Preliminary result)   Collection Time: 01/05/19  5:00 PM   Specimen: BLOOD RIGHT HAND  Result Value Ref Range Status   Specimen Description BLOOD RIGHT HAND  Final   Special Requests   Final    BOTTLES DRAWN AEROBIC AND ANAEROBIC Blood Culture results may not be optimal due to an inadequate volume of blood received in culture bottles   Culture   Final    NO GROWTH 1 DAY Performed at [facility], [address] [place], [place] [address]  Report Status PENDING  Incomplete  Blood culture (routine x 2)     Status: None (Preliminary result)   Collection Time: 01/05/19  5:05 PM   Specimen: BLOOD RIGHT WRIST  Result Value Ref Range Status   Specimen Description BLOOD RIGHT WRIST  Final   Special Requests   Final    BOTTLES DRAWN AEROBIC ONLY Blood Culture results may not be optimal due to an inadequate volume of blood received in culture bottles   Culture   Final    NO GROWTH 1 DAY Performed at [facility], [address], [place], [place] [address]    Report Status PENDING  Incomplete    Discharge Instructions     Discharge Instructions    Ambulatory referral to Neurology   Complete by: As directed    An appointment is requested in approximately: 4 weeks, pt is Dr. Zannie Cove pt. Thanks.   Diet - low sodium heart healthy   Complete by: As directed    Discharge instructions   Complete by: As directed    You are seen and evaluated in the hospital after you were found to have a stroke.  Upon discharge: -Start taking aspirin 81 mg daily -Take Plavix 75 mg once daily for the next 3 weeks and then stop -Start taking Lipitor 20 mg daily -Do not take your losartan for the next 1 day.  Then you can restart losartan 50 mg daily -Get lab work in 1 week and follow-up with your primary care physician in 1 week with lab work -Follow-up with your neurologist as scheduled -Follow-up with hand surgery outpatient  Please refrain from using any illicit substances as this can greatly impact your health, cause repeat stroke, cause a heart attack and lead to death.  If you have any significant change or worsening of your symptoms please not hesitate to contact your primary care physician or return to the ED.   Increase activity slowly   Complete by: As directed      Allergies as of 01/06/2019      Reactions   Amoxicillin Hives, Rash   Has patient had a PCN reaction causing immediate rash, facial/tongue/throat swelling, SOB or lightheadedness with hypotension: Yes, hives Has patient had a PCN reaction causing severe rash involving mucus membranes or skin necrosis: No Has patient had a PCN reaction that required hospitalization No Has patient had a PCN reaction occurring within the last 10 years: Yes If all of the above answers are "NO", then may proceed with Cephalosporin use.   Clindamycin/lincomycin Diarrhea, Other (See Comments)   Patient states that clindamycin causes diarrhea    Ropinirole Other (See  Comments)   "Made me insane" (per the patient)   Wellbutrin [bupropion] Diarrhea, Other (See Comments)   Lightheadedness, also   Sulfamethoxazole Nausea And Vomiting, Rash   Sulfonamide Derivatives Nausea And Vomiting, Rash      Medication List    STOP taking these medications   ibuprofen 200 MG tablet Commonly known as: ADVIL     TAKE these medications   albuterol 108 (90 Base) MCG/ACT inhaler Commonly known as: VENTOLIN HFA Inhale 2 puffs into the lungs every 6 (six) hours as needed for wheezing or shortness of breath.   alendronate 70 MG tablet Commonly known as: FOSAMAX TAKE 1 TABLET BY MOUTH WEEKLY WITH FULL GLASS OF WATER ON EMPTY STOMACH 30 MIN PRIOR TO BREAKFAST   aspirin 81 MG EC tablet Take 1 tablet (81 mg total) by mouth daily. Start  taking on: January 07, 2019   atorvastatin 20 MG tablet Commonly known as: LIPITOR Take 1 tablet (20 mg total) by mouth daily at 6 PM.   budesonide-formoterol 160-4.5 MCG/ACT inhaler Commonly known as: SYMBICORT Inhale 2 puffs into the lungs 2 (two) times daily as needed (for flares).   citalopram 40 MG tablet Commonly known as: CELEXA Take 0.5 tablets (20 mg total) by mouth daily for 30 days.   clopidogrel 75 MG tablet Commonly known as: PLAVIX Take 1 tablet (75 mg total) by mouth daily. Start taking on: January 07, 2019   gabapentin 300 MG capsule Commonly known as: NEURONTIN TAKE 1 CAPSULE (300 MG TOTAL) BY MOUTH 4 (FOUR) TIMES DAILY. FOR NERVE PAIN What changed:   how much to take  when to take this  additional instructions   Klor-Con M10 10 MEQ tablet Generic drug: potassium chloride Take 10 mEq by mouth once a week. On Wednesday   losartan 50 MG tablet Commonly known as: COZAAR Take 1 tablet (50 mg total) daily by mouth. What changed: Another medication with the same name was removed. Continue taking this medication, and follow the directions you see here.   nitroGLYCERIN 0.4 MG SL tablet Commonly known  as: NITROSTAT Place 1 tablet (0.4 mg total) under the tongue every 5 (five) minutes x 3 doses as needed for chest pain (chest pain).   rOPINIRole 0.25 MG tablet Commonly known as: REQUIP TAKE 1 TABLET (0.25 MG TOTAL) BY MOUTH AT BEDTIME.      Follow-up Information    Levert Feinstein, MD. Schedule an appointment as soon as possible for a visit in 4 week(s).   Specialty: Neurology Contact information: [address] [place] [address] [phone]          Allergies  Allergen Reactions   Amoxicillin Hives and Rash     Has patient had a PCN reaction causing immediate rash, facial/tongue/throat swelling, SOB or lightheadedness with hypotension: Yes, hives Has patient had a PCN reaction causing severe rash involving mucus membranes or skin necrosis: No Has patient had a PCN reaction that required hospitalization No Has patient had a PCN reaction occurring within the last 10 years: Yes If all of the above answers are "NO", then may proceed with Cephalosporin use.    Clindamycin/Lincomycin Diarrhea and Other (See Comments)    Patient states that clindamycin causes diarrhea    Ropinirole Other (See Comments)    "Made me insane" (per the patient)   Wellbutrin [Bupropion] Diarrhea and Other (See Comments)    Lightheadedness, also   Sulfamethoxazole Nausea And Vomiting and Rash   Sulfonamide Derivatives Nausea And Vomiting and Rash    Time coordinating discharge: Over 30 minutes   SIGNED:   Jae Dire, D.O. [facility] 01/06/2019, 6:00 PM

## 2019-01-06 NOTE — Evaluation (Signed)
Occupational Therapy Evaluation Patient Details Name: Brittany [name] Milham MRN: [id number] DOB: 1955-08-23 Today's Date: 01/06/2019    History of Present Illness Pt is a 63 y.o. female who presented to the ED wtih AMS and hypoxia. Additionally, she had fallen the frevious night hitting her head and suffering L comminuted fracture of the distal radius and non-displaced fracture of ulnar styloid. She was found to have acute lacunar infarct in the left occipital pole.    Clinical Impression   Pt presented to OT evaluation with significant confusion, decreased attention, and poor orientation (to self only). She currently is requiring maximum assistance with ADL due to instability, weakness, and left upper extremity restrictions. She reports independence with crutch prior to admission although unsure of accuracy of report. Pt requiring moderate assistance for functional mobility as a precursor to toilet transfers. At current functional level, she would best benefit SNF level rehabilitation post-acute D/C to maximize return to PLOF. Will continue to follow while admitted.     Follow Up Recommendations  SNF;Supervision/Assistance - 24 hour    Equipment Recommendations  Other (comment)(TBD at next venue of care)    Recommendations for Other Services       Precautions / Restrictions Precautions Precautions: Fall Required Braces or Orthoses: Sling(L ) Restrictions Weight Bearing Restrictions: Yes LUE Weight Bearing: Non weight bearing Other Position/Activity Restrictions: Volar splint and sling      Mobility Bed Mobility Overal bed mobility: Needs Assistance Bed Mobility: Supine to Sit     Supine to sit: Min assist     General bed mobility comments: Min assist due to inability to use left hand.   Transfers Overall transfer level: Needs assistance Equipment used: 1 person hand held assist Transfers: Sit to/from Stand Sit to Stand: Mod assist         General transfer comment: Mod  assist for stability, powering up with min assist    Balance Overall balance assessment: Needs assistance Sitting-balance support: Single extremity supported;Feet supported Sitting balance-Leahy Scale: Fair     Standing balance support: Single extremity supported;During functional activity Standing balance-Leahy Scale: Poor Standing balance comment: Relied on upper extremity support from OT                           ADL either performed or assessed with clinical judgement   ADL Overall ADL's : Needs assistance/impaired Eating/Feeding: Minimal assistance;Sitting   Grooming: Minimal assistance;Sitting   Upper Body Bathing: Maximal assistance;Sitting   Lower Body Bathing: Maximal assistance;Sit to/from stand   Upper Body Dressing : Maximal assistance;Sitting   Lower Body Dressing: Maximal assistance;Sit to/from stand   Toilet Transfer: Moderate assistance Toilet Transfer Details (indicate cue type and reason): Mod handheld assist for side steps at EOB; unable to progress further due to transport team arriving           General ADL Comments: Pt demonstrating decreased activity tolerance and functional mobility. She reports inability to feel her right foot. Poor balance and difficulty coordinating her movements.      Vision Patient Visual Report: No change from baseline Vision Assessment?: Vision impaired- to be further tested in functional context Additional Comments: Need to assess further     Perception     Praxis      Pertinent Vitals/Pain Pain Assessment: Faces Faces Pain Scale: Hurts even more Pain Location: L wrist Pain Descriptors / Indicators: Aching;Guarding Pain Intervention(s): Repositioned;Limited activity within patient's tolerance     Hand Dominance  Extremity/Trunk Assessment Upper Extremity Assessment Upper Extremity Assessment: LUE deficits/detail;RUE deficits/detail RUE Deficits / Details: Generally weak LUE Deficits / Details:  Wiggling digits, able to flex/extend elbow LUE: Unable to fully assess due to immobilization;Unable to fully assess due to pain   Lower Extremity Assessment Lower Extremity Assessment: Defer to PT evaluation       Communication Communication Communication: No difficulties   Cognition Arousal/Alertness: Lethargic(fluctuating) Behavior During Therapy: WFL for tasks assessed/performed Overall Cognitive Status: Impaired/Different from baseline Area of Impairment: Orientation;Attention;Memory;Following commands;Safety/judgement;Awareness;Problem solving                 Orientation Level: Disoriented to;Time;Situation;Place Current Attention Level: Sustained Memory: Decreased recall of precautions;Decreased short-term memory Following Commands: Follows one step commands inconsistently Safety/Judgement: Decreased awareness of safety;Decreased awareness of deficits Awareness: Intellectual Problem Solving: Slow processing;Decreased initiation;Difficulty sequencing;Requires verbal cues;Requires tactile cues General Comments: Pt with intermittent lethargy and very tangential in speech. Attempted to tell OT 4x how she broke her wrist but was not able to get through the story before moving on to a different topic.    General Comments  Pt very confused throughout; transport team arriving during session    Exercises     Shoulder Instructions      Home Living Family/patient expects to be discharged to:: Shelter/Homeless                                 Additional Comments: Pt initially reporting homelessness and then that she has been staying in hotels with various people. She reported that she had a place lined up in November but was very confused and unable to provide accurate report.       Prior Functioning/Environment Level of Independence: Independent with assistive device(s)        Comments: Assume independence, she does report use of a crutch and decreased  sensation RLE; unable to report PLOF        OT Problem List: Decreased strength;Decreased range of motion;Decreased activity tolerance;Impaired balance (sitting and/or standing);Decreased cognition;Decreased safety awareness;Decreased knowledge of use of DME or AE;Decreased knowledge of precautions;Impaired vision/perception;Decreased coordination;Pain;Impaired sensation;Impaired UE functional use      OT Treatment/Interventions: Self-care/ADL training;Therapeutic exercise;Neuromuscular education;Energy conservation;DME and/or AE instruction;Therapeutic activities;Cognitive remediation/compensation;Patient/family education;Balance training;Splinting    OT Goals(Current goals can be found in the care plan section) Acute Rehab OT Goals Patient Stated Goal: to get her own place OT Goal Formulation: With patient Time For Goal Achievement: 01/20/19 Potential to Achieve Goals: Good ADL Goals Pt Will Perform Grooming: standing;with min assist Pt Will Perform Upper Body Dressing: sitting;with min assist Pt Will Perform Lower Body Dressing: with min assist;sit to/from stand Pt Will Transfer to Toilet: with min assist;ambulating;regular height toilet(with least restrictive assistive device) Pt Will Perform Toileting - Clothing Manipulation and hygiene: with min guard assist;sit to/from stand Additional ADL Goal #1: Pt will verbalize left upper extremity precautions with only minimal verbal reminders.  OT Frequency: Min 2X/week   Barriers to D/C:            Co-evaluation              AM-PAC OT "6 Clicks" Daily Activity     Outcome Measure Help from another person eating meals?: A Little Help from another person taking care of personal grooming?: A Little Help from another person toileting, which includes using toliet, bedpan, or urinal?: A Lot Help from another person bathing (including washing, rinsing, drying)?: A Lot  Help from another person to put on and taking off regular upper  body clothing?: A Lot Help from another person to put on and taking off regular lower body clothing?: A Lot 6 Click Score: 14   End of Session Equipment Utilized During Treatment: Gait belt;Rolling walker  Activity Tolerance: Patient tolerated treatment well Patient left: in bed;with call bell/phone within reach;with bed alarm set  OT Visit Diagnosis: Other abnormalities of gait and mobility (R26.89);Other symptoms and signs involving cognitive function                Time: 8341-9622 OT Time Calculation (min): 17 min Charges:  OT General Charges $OT Visit: 1 Visit OT Evaluation $OT Eval Moderate Complexity: 1 [address] [place], OTR/L Acute Rehabilitation Services Office [phone]   Tamzin Bertling A Tarez Bowns 01/06/2019, 1:51 PM

## 2019-01-06 NOTE — TOC Initial Note (Signed)
Transition of Care [facility]) - Initial/Assessment Note    Patient Details  Name: Brittany [name] MRN: [id number] Date of Birth: Aug 18, 1955  [facility] CM/SW Contact:    Gelene Mink, [place] Phone Number: 01/06/2019, 3:41 PM  Clinical Narrative:                  CSW met with the patient at bedside. CSW introduced herself and explained her role. She stated that she has secured housing. She will be renting a home in [place] with some friends. The patient receives a disability check a month which comes to $1200.00. The patient does not have food stamps. She would be accepting of emergency food assistance. Patient was in a lot of pain due to her arm.   CSW shared therapy recommendations and she stated that she did not feel that she needed to go to rehab.   Patient is agreeable for CSW to send out referrals in [facility] for food assistance. The patient does not drive and utilizes public transportation.   CSW addressed substance use and she stated that does not have a drug problem. Patient stated that she uses marijuana for pain release.   Patient does have a PCP, she sees a NP at [place]. Patient does attend her appointments.   CSW provided homeless resources and substance abuse resources.   CSW will continue to follow.   Domenic Schwab, MSW, LCSW-A Clinical Social Worker [facility]  [phone]  Expected Discharge Plan: Homeless Shelter Barriers to Discharge: Continued Medical Work up   Patient Goals and CMS Choice Patient states their goals for this hospitalization and ongoing recovery are:: Pt wants to feel better   Choice offered to / list presented to : NA  Expected Discharge Plan and Services Expected Discharge Plan: Homeless Shelter In-house Referral: Clinical Social Work Discharge Planning Services: NA Post Acute Care Choice: NA Living arrangements for the past 2 months: Homeless                 DME Arranged: N/A DME  Agency: NA       HH Arranged: NA [place] Agency: NA        Prior Living Arrangements/Services Living arrangements for the past 2 months: Homeless Lives with:: Self Patient language and need for interpreter reviewed:: No Do you feel safe going back to the place where you live?: Yes      Need for Family Participation in Patient Care: No (Comment) Care giver support system in place?: Yes (comment)   Criminal Activity/Legal Involvement Pertinent to Current Situation/Hospitalization: No - Comment as needed  Activities of Daily Living      Permission Sought/Granted Permission sought to share information with : Case Manager Permission granted to share information with : Yes, Verbal Permission Granted              Emotional Assessment Appearance:: Appears stated age Attitude/Demeanor/Rapport: Engaged Affect (typically observed): Other (comment)(Emotional) Orientation: : Oriented to Self, Oriented to Place, Oriented to  Time, Oriented to Situation Alcohol / Substance Use: Illicit Drugs Psych Involvement: No (comment)  Admission diagnosis:  AKI (acute kidney injury) ([place]) [N17.9] Hypotension, unspecified hypotension type [I95.9] Altered mental status, unspecified altered mental status type [R41.82] Patient Active Problem List   Diagnosis Date Noted  . Lacunar infarct, acute ([place]) 01/05/2019  . Altered mental status associated with intoxication ([place]) 01/05/2019  . Hypotension 01/05/2019  . Left wrist fracture 01/05/2019  . AKI (acute kidney injury) ([place]) 01/05/2019  .  Restless leg syndrome 07/10/2018  . Peripheral neuropathy 07/10/2018  . COPD (chronic obstructive pulmonary disease) ([place]) 10/05/2017  . GERD (gastroesophageal reflux disease) 10/05/2017  . Depression 10/05/2017  . Facial cellulitis-left 10/05/2017  . Diarrhea 10/05/2017  . Chronic viral hepatitis C ([place]) 06/20/2017  . Idiopathic peripheral neuropathy 05/19/2017  . Chronic low back pain without sciatica  05/19/2017  . Hyperglycemia 05/19/2017  . Chronic bronchitis ([place]) 03/08/2017  . Elevated LFTs 03/08/2017  . Current severe episode of major depressive disorder without psychotic features ([place]) 03/08/2017  . GAD (generalized anxiety disorder) 03/08/2017  . Hepatomegaly 03/08/2017  . Acute exacerbation of chronic obstructive pulmonary disease (COPD) ([place]) 02/16/2017  . Acute respiratory failure with hypoxia ([place])   . COPD with acute exacerbation ([place]) 01/27/2017  . CAP (community acquired pneumonia) 01/27/2017  . Tachycardia 01/27/2017  . COPD exacerbation ([place]) 01/27/2017  . Hypokalemia 01/27/2017  . Left arm numbness 11/28/2016    Class: Chronic  . Nondisplaced transverse fracture of left patella, initial encounter for closed fracture 06/10/2016  . Malnutrition of moderate degree ([place]) 07/12/2013  . Syncope 07/11/2013  . GASTROENTERITIS 05/07/2010  . TOBACCO ABUSE 07/01/2009  . Benign essential HTN 06/24/2009  . BRONCHITIS, ACUTE 06/24/2009   PCP:  Sandrea Hughs, NP Pharmacy:   [id number] [id number]: [phone]: [phone]    Social Determinants of Health (SDOH) Interventions    Readmission Risk Interventions No flowsheet data found.

## 2019-01-07 LAB — HEMOGLOBIN A1C
Hgb A1c MFr Bld: 5.8 % — ABNORMAL HIGH (ref 4.8–5.6)
Mean Plasma Glucose: 120 mg/dL

## 2019-01-08 ENCOUNTER — Telehealth: Payer: Self-pay | Admitting: *Deleted

## 2019-01-08 NOTE — Telephone Encounter (Signed)
I have made the 1st attempt to contact the patient or family member in charge, in order to follow up from recently being discharged from the hospital. I left a message on voicemail but I will make another attempt at a different time.  

## 2019-01-09 NOTE — Telephone Encounter (Signed)
I have made the 2nd attempt to contact the patient or family member in charge, in order to follow up from recently being discharged from the hospital. I left a message on voicemail but I will make another attempt at a different time.  

## 2019-01-10 LAB — CULTURE, BLOOD (ROUTINE X 2)
Culture: NO GROWTH
Culture: NO GROWTH

## 2019-01-10 NOTE — Telephone Encounter (Signed)
Patient was discharged from the hospital to live with a friend until November due to being homeless for the past 6 months.Patient had a Education officer, museum during hospitalization.can we contact the social worker?

## 2019-01-10 NOTE — Telephone Encounter (Signed)
I have made the 3rd attempt to contact the patient or family member in charge, in order to follow up from recently being discharged from the hospital. I left a message on voicemail but I will make another attempt at a different time.  

## 2019-02-08 ENCOUNTER — Encounter (HOSPITAL_COMMUNITY): Payer: Self-pay | Admitting: Emergency Medicine

## 2019-02-08 ENCOUNTER — Emergency Department (HOSPITAL_COMMUNITY)
Admission: EM | Admit: 2019-02-08 | Discharge: 2019-02-08 | Discharge status: Against Medical Advice | Payer: Medicare HMO | Attending: Emergency Medicine | Admitting: Emergency Medicine

## 2019-02-08 ENCOUNTER — Other Ambulatory Visit: Payer: Self-pay

## 2019-02-08 DIAGNOSIS — Z5321 Procedure and treatment not carried out due to patient leaving prior to being seen by health care provider: Secondary | ICD-10-CM | POA: Insufficient documentation

## 2019-02-08 DIAGNOSIS — R0602 Shortness of breath: Secondary | ICD-10-CM

## 2019-02-08 DIAGNOSIS — R6883 Chills (without fever): Secondary | ICD-10-CM

## 2019-02-08 LAB — BASIC METABOLIC PANEL
Anion gap: 17 — ABNORMAL HIGH (ref 5–15)
BUN: 18 mg/dL (ref 6–20)
CO2: 22 mmol/L (ref 22–32)
Calcium: 9.4 mg/dL (ref 8.9–10.3)
Chloride: 97 mmol/L — ABNORMAL LOW (ref 98–111)
Creatinine, Ser: 0.97 mg/dL (ref 0.44–1.00)
GFR calc Af Amer: >60 mL/min (ref >60)
GFR calc non Af Amer: >60 mL/min (ref >60)
Glucose, Bld: 125 mg/dL — ABNORMAL HIGH (ref 70–99)
Potassium: 3 mmol/L — ABNORMAL LOW (ref 3.5–5.1)
Sodium: 136 mmol/L (ref 135–145)

## 2019-02-08 LAB — I-STAT BETA HCG BLOOD, ED (MC, WL, AP ONLY): I-stat hCG, quantitative: 14.7 m[IU]/mL — ABNORMAL HIGH (ref <5)

## 2019-02-08 LAB — CBC
HCT: 40.7 % (ref 36.0–46.0)
Hemoglobin: 13.9 g/dL (ref 12.0–15.0)
MCH: 29.8 pg (ref 26.0–34.0)
MCHC: 34.2 g/dL (ref 30.0–36.0)
MCV: 87.3 fL (ref 80.0–100.0)
Platelets: 291 10*3/uL (ref 150–400)
RBC: 4.66 MIL/uL (ref 3.87–5.11)
RDW: 14.6 % (ref 11.5–15.5)
WBC: 20.4 10*3/uL — ABNORMAL HIGH (ref 4.0–10.5)
nRBC: 0 % (ref 0.0–0.2)

## 2019-02-08 NOTE — ED Triage Notes (Signed)
Pt endorses rib pain, fevers and unable to eat in 3 days. Reports a dry cough and SOB. Denies any sick contacts.

## 2019-02-08 NOTE — ED Notes (Signed)
Patient called for room x3 with no answer, unable to locate patient in waiting area.

## 2019-02-19 ENCOUNTER — Other Ambulatory Visit: Payer: Self-pay

## 2019-02-19 ENCOUNTER — Ambulatory Visit: Payer: Medicare HMO | Admitting: Neurology

## 2019-02-19 ENCOUNTER — Encounter: Payer: Self-pay | Admitting: Neurology

## 2019-02-19 VITALS — BP 116/75 | HR 101 | Temp 97.8°F | Ht 62.0 in | Wt 106.0 lb

## 2019-02-19 DIAGNOSIS — I639 Cerebral infarction, unspecified: Secondary | ICD-10-CM

## 2019-02-19 DIAGNOSIS — G2581 Restless legs syndrome: Secondary | ICD-10-CM

## 2019-02-19 DIAGNOSIS — G609 Hereditary and idiopathic neuropathy, unspecified: Secondary | ICD-10-CM | POA: Diagnosis not present

## 2019-02-19 MED ORDER — GABAPENTIN 300 MG PO CAPS: 600.0000 mg | ORAL_CAPSULE | Freq: Two times a day (BID) | ORAL | 11 refills | Status: AC

## 2019-02-19 NOTE — Progress Notes (Signed)
PATIENT: Brittany [name] DOB: 27-Oct-1955  Chief Complaint  Patient presents with  . Cerebrovascular Accident    Hospital follow up for CVA. She is currently taking aspirin 81mg  daily, Plavix 75mg  daily and Lipitor 20mg  daily for stroke prevention.  Says this is her fourth stroke.  Feels she returned to baseline.  No therapy was needed.  Reports she last used heroin in 12/2018.  She does have pain, tingling and weakness in her right foot but this is from a sprain in August 2020.     HISTORICAL:  Brittany [name] is a 63 years old female, seen in request by her primary care NP Ngetich, Dinah C for evaluation of restless leg syndrome. Initial evaluation was through virtual visit on July 10 2018.  I have reviewed and summarized the referring note from the referring physician.  She has past medical history of hypertension, osteoporosis, peripheral neuropathy, citalopram, COPD, long time smoker  She reported history of bilateral feet paresthesia since 2019, was diagnosed with peripheral neuropathy, per patient, there was no etiology found, she has no history of diabetes, she was given prescription of gabapentin 300 mg twice daily which has been helpful, she underwent  Odontogenic left facial infection in July, CT scan showed left lower face cellulitis, myositis, require surgery,  Laboratory evaluations on October 10, 2017 showed hemoglobin of 10, WBC of 6.0, BMP showed low calcium 7.8, glucose of 114,  Since summer 2019, she noticed restless leg symptoms, difficulty falling to sleep when lying down still at nighttime, urge to move her leg, less symptomatic during the day, but with prolonged sitting, she would have similar recurrent restless, urge to move, sometimes spreading to bilateral upper extremity  She had a short trial of Requip 0.25 mg every night for 2 weeks, did not improve her symptoms, "driving me crazy"  She denies significant gait abnormality  UPDATE Feb 19 2019: This is to follow-up  her most recent hospital admission on October 17-18, 2020.  She was brought to the emergency room by her friend October 17 for altered mental status, was found to be hypoxic, with a O2 saturation in 80s and pinpoint pupil on arrival.  Patient previously required supplement oxygen, but became homeless, was not able to keep the oxygen tank,  On emergency room, blood pressure 85/75, with heart rate of 105, UDS was positive for opiates, cocaine, amphetamine, THC, she was also found to have communicated fracture of distal radius, and nondisplaced fracture of ulnar styloid,  CT of the head and cervical spine showed lacunar infarction in the right cerebellar hemisphere, and right occipital lobe infarction with small infarct at the right frontal lobe,  Her symptoms quickly improved after normal saline,  I personally reviewed MRI of the brain without contrast January 05, 2019: Punctuate acute lacunar infarction in the left occipital pole, small area of chronic encephalomalacia with associated hemosiderin at the right frontal, right occipital right cerebellum.  MRA of head and neck showed severely motion degraded study, no significant large vessel disease.  She was given aspirin plus Plavix overlap for 3 weeks, then aspirin alone, and also Lipitor 20 mg daily,   REVIEW OF SYSTEMS: Full 14 system review of systems performed and notable only for as above All other review of systems were negative.  ALLERGIES: Allergies  Allergen Reactions  . Amoxicillin Hives and Rash     Has patient had a PCN reaction causing immediate rash, facial/tongue/throat swelling, SOB or lightheadedness with hypotension: Yes, hives Has patient had a  PCN reaction causing severe rash involving mucus membranes or skin necrosis: No Has patient had a PCN reaction that required hospitalization No Has patient had a PCN reaction occurring within the last 10 years: Yes If all of the above answers are "NO", then may proceed with  Cephalosporin use.   . Clindamycin/Lincomycin Diarrhea and Other (See Comments)    Patient states that clindamycin causes diarrhea   . Ropinirole Other (See Comments)    "Made me insane" (per the patient)  . Wellbutrin [Bupropion] Diarrhea and Other (See Comments)    Lightheadedness, also  . Sulfamethoxazole Nausea And Vomiting and Rash  . Sulfonamide Derivatives Nausea And Vomiting and Rash    HOME MEDICATIONS: Current Outpatient Medications  Medication Sig Dispense Refill  . albuterol (PROVENTIL HFA;VENTOLIN HFA) 108 (90 Base) MCG/ACT inhaler Inhale 2 puffs into the lungs every 6 (six) hours as needed for wheezing or shortness of breath.    Marland Kitchen alendronate (FOSAMAX) 70 MG tablet TAKE 1 TABLET BY MOUTH WEEKLY WITH FULL GLASS OF WATER ON EMPTY STOMACH 30 MIN PRIOR TO BREAKFAST 12 tablet 0  . aspirin EC 81 MG EC tablet Take 1 tablet (81 mg total) by mouth daily. 90 tablet 0  . atorvastatin (LIPITOR) 20 MG tablet Take 1 tablet (20 mg total) by mouth daily at 6 PM. 90 tablet 0  . budesonide-formoterol (SYMBICORT) 160-4.5 MCG/ACT inhaler Inhale 2 puffs into the lungs 2 (two) times daily as needed (for flares).     . clopidogrel (PLAVIX) 75 MG tablet Take 1 tablet (75 mg total) by mouth daily. 21 tablet 0  . gabapentin (NEURONTIN) 300 MG capsule TAKE 1 CAPSULE (300 MG TOTAL) BY MOUTH 4 (FOUR) TIMES DAILY. FOR NERVE PAIN (Patient taking differently: Take 600 mg by mouth See admin instructions. Take 600 mg by mouth in the morning and 600 mg at bedtime) 360 capsule 3  . losartan (COZAAR) 50 MG tablet Take 1 tablet (50 mg total) daily by mouth. 30 tablet 1  . nitroGLYCERIN (NITROSTAT) 0.4 MG SL tablet Place 1 tablet (0.4 mg total) under the tongue every 5 (five) minutes x 3 doses as needed for chest pain (chest pain). 25 tablet 1  . citalopram (CELEXA) 40 MG tablet Take 0.5 tablets (20 mg total) by mouth daily for 30 days. 30 tablet 3   No current facility-administered medications for this visit.      PAST MEDICAL HISTORY: Past Medical History:  Diagnosis Date  . Alzheimer disease (HCC)   . Anxiety   . Bilateral pneumonia 01/2017   per new patient packet   . COPD (chronic obstructive pulmonary disease) (HCC) 01/2017   per new patient packet   . Depression   . Fatty liver   . Gallbladder disease 2014   per new patient packet   . Hernia, abdominal   . Hypertension   . Liver damage 01/2017   per new patient packet, Dr.Kadakia  . Normal cardiac stress test 2007  . Ovarian cyst 1977   8 1/2 lb left ovarian cyst, Dr.Cox, per new patient packet   . Pneumonia   . Right ovarian cyst    1992, 1993, 1994, 1995, and 1996 Dr.Neil, per new patient packet   . Stroke (HCC)    4 strokes.     PAST SURGICAL HISTORY: Past Surgical History:  Procedure Laterality Date  . ABDOMINAL HYSTERECTOMY Bilateral    Total  . CESAREAN SECTION    . CHOLECYSTECTOMY    . INGUINAL HERNIA REPAIR Right   .  LEFT OOPHORECTOMY  1977   with cyst removal, age 63  . MINOR EXCISION OF ORAL LESION N/A 10/07/2017   Procedure: IRRIGATION AND DEBRIDEMENT OF ORAL INFECTION, REMOVAL OF REMAINING 8 TEETH AND PLACEMENT OF PENROSE DRAINS;  Surgeon: Vivia Ewingrab, Justin, DMD;  Location: [facility];  Service: Oral Surgery;  Laterality: N/A;  . OVARIAN CYST REMOVAL Right 93, 94,95, 96  . UMBILICAL HERNIA REPAIR      FAMILY HISTORY: Family History  Problem Relation Age of Onset  . Stroke Mother   . COPD Mother   . Dementia Mother   . Breast cancer Mother   . Diabetes Father   . Heart attack Father   . Diabetes Sister   . Stroke Sister        x2  . Hepatitis C Daughter   . Diabetes Paternal Grandmother     SOCIAL HISTORY: Social History   Socioeconomic History  . Marital status: Divorced    Spouse name: Not on file  . Number of children: 1  . Years of education: one year of college  . Highest education level: Not on file  Occupational History  . Occupation: disabled  Social Needs  . Financial resource strain:  Not on file  . Food insecurity    Worry: Not on file    Inability: Not on file  . Transportation needs    Medical: Not on file    Non-medical: Not on file  Tobacco Use  . Smoking status: Current Every Day Smoker    Packs/day: 0.50    Years: 46.00    Pack years: 23.00    Types: Cigarettes  . Smokeless tobacco: Never Used  Substance and Sexual Activity  . Alcohol use: Yes    Alcohol/week: 1.0 standard drinks    Types: 1 Cans of beer per week    Frequency: Never    Comment: once or twice a year.   . Drug use: Not Currently    Comment: heroin - last used in 12/2018  . Sexual activity: Never    Comment: last partner ex husbanc 8 years ago   Lifestyle  . Physical activity    Days per week: Not on file    Minutes per session: Not on file  . Stress: Not on file  Relationships  . Social Musicianconnections    Talks on phone: Not on file    Gets together: Not on file    Attends religious service: Not on file    Active member of club or organization: Not on file    Attends meetings of clubs or organizations: Not on file    Relationship status: Not on file  . Intimate partner violence    Fear of current or ex partner: Not on file    Emotionally abused: Not on file    Physically abused: Not on file    Forced sexual activity: Not on file  Other Topics Concern  . 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      Lives at home with roommates.      Living Will: Yes   DNR: Yes   POA/HPOA: No      Functional Status:   Do you have difficulty bathing or dressing yourself? No   Do you have difficulty preparing food or eating?  No   Do you have difficulty managing your medications? No   Do you have difficulty managing your finances? No   Do you have difficulty affording your medications? Yes Inhalers      PHYSICAL EXAM   Vitals:   02/19/19 1433  BP: 116/75  Pulse: (!) 101  Temp: 97.8 F (36.6 C)  Weight: 106 lb (48.1 kg)  Height: 5\' 2"  (1.575 m)    Not recorded      Body mass index is 19.39 kg/m.  PHYSICAL EXAMNIATION:  Gen: NAD, conversant, well nourised, well groomed                     Cardiovascular: Regular rate rhythm, no peripheral edema, warm, nontender. Eyes: Conjunctivae clear without exudates or hemorrhage Neck: Supple, no carotid bruits. Pulmonary: Clear to auscultation bilaterally   NEUROLOGICAL EXAM:  MENTAL STATUS: Speech:    Speech is normal; fluent and spontaneous with normal comprehension.  Cognition:     Orientation to time, place and person     Normal recent and remote memory     Normal Attention span and concentration     Normal Language, naming, repeating,spontaneous speech     Fund of knowledge   CRANIAL NERVES: CN II: Visual fields are full to confrontation. Pupils are round equal and briskly reactive to light. CN III, IV, VI: extraocular movement are normal. No ptosis. CN V: Facial sensation is intact to pinprick in all 3 divisions bilaterally. Corneal responses are intact.  CN VII: Face is symmetric with normal eye closure and smile. CN VIII: Hearing is normal to causal conversation. CN IX, X: Palate elevates symmetrically. Phonation is normal. CN XI: Head turning and shoulder shrug are intact CN XII: Tongue is midline with normal movements and no atrophy.  MOTOR: There is no pronator drift of out-stretched arms. Muscle bulk and tone are normal. Muscle strength is normal.  REFLEXES: Reflexes are 1 and symmetric at the biceps, triceps, knees, and ankles. Plantar responses are flexor.  SENSORY: Intact to light touch, pinprick and vibratory sensation are  intact in fingers and toes.  COORDINATION: There is no trunk or limb dysmetria noted.  GAIT/STANCE: She needs push-up to get up from seated position, mildly unsteady  DIAGNOSTIC DATA (LABS, IMAGING, TESTING) - I reviewed patient records, labs, notes, testing and imaging myself where available.   ASSESSMENT AND PLAN  Berneita Sanagustin is a 63 y.o. female   Acute onset of confusion January 05, 2019  Due to polysubstance abuse, that was positive for cocaine, opiates, amphetamine, THC,  Also had hypoxia, history of COPD, was not able to continue home oxygen  The small vessel stroke at left occipital pole,  Most likely due to small vessel disease,  Continue aspirin 81 mg daily  Only return to clinic for new issues   January 07, 2019, M.D. Ph.D.  [facility] [address], [address] [place], [place] Ph: [phone] Fax: [phone]  CC: Referring Provider

## 2019-05-06 ENCOUNTER — Encounter: Payer: Self-pay | Admitting: Family

## 2019-05-06 ENCOUNTER — Other Ambulatory Visit: Payer: Self-pay

## 2019-05-06 ENCOUNTER — Ambulatory Visit (INDEPENDENT_AMBULATORY_CARE_PROVIDER_SITE_OTHER): Payer: Medicare HMO | Admitting: Family

## 2019-05-06 VITALS — BP 130/80 | HR 98 | Temp 97.7°F | Resp 18 | Ht 62.0 in | Wt 104.2 lb

## 2019-05-06 DIAGNOSIS — I10 Essential (primary) hypertension: Secondary | ICD-10-CM

## 2019-05-06 DIAGNOSIS — M816 Localized osteoporosis [Lequesne]: Secondary | ICD-10-CM | POA: Diagnosis not present

## 2019-05-06 DIAGNOSIS — M62830 Muscle spasm of back: Secondary | ICD-10-CM

## 2019-05-06 MED ORDER — METHOCARBAMOL 500 MG PO TABS: 500.0000 mg | ORAL_TABLET | Freq: Three times a day (TID) | ORAL | 0 refills | Status: DC | PRN

## 2019-05-06 MED ORDER — CLONIDINE HCL 0.1 MG PO TABS
0.1000 mg | ORAL_TABLET | Freq: Once | ORAL | Status: AC
  Administered 2019-05-06: 17:00:00 0.1 mg via ORAL

## 2019-05-06 MED ORDER — ALENDRONATE SODIUM 70 MG PO TABS: ORAL_TABLET | ORAL | 0 refills | Status: DC

## 2019-05-06 NOTE — Progress Notes (Signed)
Provider: Richarda Blade FNP-C  Earnie Bechard, Donalee Citrin, NP  Patient Care Team: Penni Penado, Donalee Citrin, NP as PCP - General (Family Medicine) Chilton Greathouse, MD as Consulting Physician (Pulmonary Disease)  Extended Emergency Contact Information Primary Emergency Contact: Isaac Laud [place] of [place] Home Phone: [phone] Mobile Phone: [phone] Relation: Sister Secondary Emergency Contact: NoLastName,Buddy Mobile Phone: [phone] Relation: Friend  Code Status:  Full Code  Goals of care: Advanced Directive information Advanced Directives 05/06/2019  Does Patient Have a Medical Advance Directive? Yes  Type of Advance Directive Healthcare Power of Attorney  Does patient want to make changes to medical advance directive? No - Patient declined  Copy of Healthcare Power of Attorney in Chart? Yes - validated most recent copy scanned in chart (See row information)  Would patient like information on creating a medical advance directive? -     Chief Complaint  Patient presents with  . Acute Visit    Back Spasms     HPI:  Pt is a 64 y.o. female seen today for an acute visit for evaluation of back spasms x 5 days.she states was pulling trash can out that was open and filled up with water.she states spasm are from upper back down to her bra line.Upper back muscle tight with movement and tender to  press across upper back.Also states spasm have worse from sleeping on the floor.States was homeless but now has a roommate.States he does not help with taking trash can  to the road.she reported no fall or injury to the back.She missed previous appointment. She has had multiple visit to ED since last seen February,2020.she did not return calls for follow up ED visit. On chart review,she was seen in the ED for shortness of breath,Neuropathic pain,Altered Mental Status and latest ED visit 02/08/2019 shortness of breath. She sustained a closed displaced fracture of the 5 th metatarsal of the  left foot 09/15/2018 was evaluated in the ED and followed up outpatient with Orthopedic.Also had a sprain of right ankle 10/20/2018 no fracture or dislocation was noted in the ED. She had urine drug test in the ED that was positive for opiates,cocaine,[place] and Tetrahydrocannabinol. Her blood pressure is elevated this visit though attributes to upper back spasm states has never had such discomfort.Colonidine 0.1 mg tablet on ey mouth given for high blood pressure.B/p rechecked trended down to normal after rest.    Past Medical History:  Diagnosis Date  . Alzheimer disease (HCC)   . Anxiety   . Bilateral pneumonia 01/2017   per new patient packet   . COPD (chronic obstructive pulmonary disease) (HCC) 01/2017   per new patient packet   . Depression   . Fatty liver   . Gallbladder disease 2014   per new patient packet   . Hernia, abdominal   . Hypertension   . Liver damage 01/2017   per new patient packet, Dr.Kadakia  . Normal cardiac stress test 2007  . Ovarian cyst 1977   8 1/2 lb left ovarian cyst, Dr.Cox, per new patient packet   . Pneumonia   . Right ovarian cyst    1992, 1993, 1994, 1995, and 1996 Dr.Neil, per new patient packet   . Stroke (HCC)    4 strokes.    Past Surgical History:  Procedure Laterality Date  . ABDOMINAL HYSTERECTOMY Bilateral    Total  . CESAREAN SECTION    . CHOLECYSTECTOMY    . INGUINAL HERNIA REPAIR Right   . LEFT OOPHORECTOMY  1977   with cyst  removal, age 67  . MINOR EXCISION OF ORAL LESION N/A 10/07/2017   Procedure: IRRIGATION AND DEBRIDEMENT OF ORAL INFECTION, REMOVAL OF REMAINING 8 TEETH AND PLACEMENT OF PENROSE DRAINS;  Surgeon: Vivia Ewing, DMD;  Location: [facility];  Service: Oral Surgery;  Laterality: N/A;  . OVARIAN CYST REMOVAL Right 93, 94,95, 96  . UMBILICAL HERNIA REPAIR      Allergies  Allergen Reactions  . Amoxicillin Hives and Rash     Has patient had a PCN reaction causing immediate rash, facial/tongue/throat swelling, SOB  or lightheadedness with hypotension: Yes, hives Has patient had a PCN reaction causing severe rash involving mucus membranes or skin necrosis: No Has patient had a PCN reaction that required hospitalization No Has patient had a PCN reaction occurring within the last 10 years: Yes If all of the above answers are "NO", then may proceed with Cephalosporin use.   . Clindamycin/Lincomycin Diarrhea and Other (See Comments)    Patient states that clindamycin causes diarrhea   . Ropinirole Other (See Comments)    "Made me insane" (per the patient)  . Wellbutrin [Bupropion] Diarrhea and Other (See Comments)    Lightheadedness, also  . Sulfamethoxazole Nausea And Vomiting and Rash  . Sulfonamide Derivatives Nausea And Vomiting and Rash    Outpatient Encounter Medications as of 05/06/2019  Medication Sig  . albuterol (PROVENTIL HFA;VENTOLIN HFA) 108 (90 Base) MCG/ACT inhaler Inhale 2 puffs into the lungs every 6 (six) hours as needed for wheezing or shortness of breath.  Marland Kitchen alendronate (FOSAMAX) 70 MG tablet Take with a full glass of water on an empty stomach.  Marland Kitchen aspirin EC 81 MG EC tablet Take 1 tablet (81 mg total) by mouth daily.  Marland Kitchen atorvastatin (LIPITOR) 20 MG tablet Take 1 tablet (20 mg total) by mouth daily at 6 PM.  . budesonide-formoterol (SYMBICORT) 160-4.5 MCG/ACT inhaler Inhale 2 puffs into the lungs 2 (two) times daily as needed (for flares).   . clopidogrel (PLAVIX) 75 MG tablet Take 1 tablet (75 mg total) by mouth daily.  Marland Kitchen gabapentin (NEURONTIN) 300 MG capsule Take 2 capsules (600 mg total) by mouth 2 (two) times daily. Take 600 mg by mouth in the morning and 600 mg at bedtime  . losartan (COZAAR) 50 MG tablet Take 1 tablet (50 mg total) daily by mouth.  . nitroGLYCERIN (NITROSTAT) 0.4 MG SL tablet Place 1 tablet (0.4 mg total) under the tongue every 5 (five) minutes x 3 doses as needed for chest pain (chest pain).  . [DISCONTINUED] alendronate (FOSAMAX) 70 MG tablet TAKE 1 TABLET BY  MOUTH WEEKLY WITH FULL GLASS OF WATER ON EMPTY STOMACH 30 MIN PRIOR TO BREAKFAST  . citalopram (CELEXA) 40 MG tablet Take 0.5 tablets (20 mg total) by mouth daily for 30 days.  . methocarbamol (ROBAXIN) 500 MG tablet Take 1 tablet (500 mg total) by mouth every 8 (eight) hours as needed for up to 7 days for muscle spasms.  . [EXPIRED] cloNIDine (CATAPRES) tablet 0.1 mg    No facility-administered encounter medications on file as of 05/06/2019.    Review of Systems  Constitutional: Negative for appetite change, chills, fatigue and fever.  Respiratory: Negative for chest tightness, shortness of breath and wheezing.        Chronic cough   Cardiovascular: Negative for chest pain, palpitations and leg swelling.  Gastrointestinal: Negative for abdominal distention, abdominal pain, constipation, diarrhea, nausea and vomiting.  Musculoskeletal: Positive for arthralgias. Negative for gait problem, myalgias, neck pain and neck stiffness.  Skin: Negative for color change, pallor, rash and wound.  Neurological: Negative for dizziness, speech difficulty, weakness, light-headedness and headaches.       Left wrist chronic numbness.   Psychiatric/Behavioral: Negative for agitation, confusion and sleep disturbance.    Immunization History  Administered Date(s) Administered  . Hepatitis A, Adult 07/18/2017  . Hepatitis B, adult 06/27/2017, 07/18/2017  . Influenza Inj Mdck Quad With Preservative 02/22/2018  . Influenza,inj,Quad PF,6+ Mos 01/31/2017, 02/22/2018  . Influenza-Unspecified 03/21/2018  . Pneumococcal Polysaccharide-23 06/27/2017  . Tetanus 03/21/2014   Pertinent  Health Maintenance Due  Topic Date Due  . COLONOSCOPY  02/18/2008  . MAMMOGRAM  06/08/2015  . INFLUENZA VACCINE  10/20/2018   Fall Risk  05/04/2018 05/04/2018 04/06/2018 09/27/2017 09/20/2017  Falls in the past year? 0 0 0 No Yes  Number falls in past yr: 0 0 0 - 2 or more  Injury with Fall? 0 0 0 - No    Vitals:   05/06/19 1529  05/06/19 1655  BP: (!) 162/102 130/80  Pulse: 98   Resp: 18 18  Temp: 97.7 F (36.5 C)   SpO2: 99%   Weight: 104 lb 3.2 oz (47.3 kg)   Height: 5\' 2"  (1.575 m)    Body mass index is 19.06 kg/m. Physical Exam Constitutional:      General: She is not in acute distress.    Appearance: She is not ill-appearing.  Eyes:     General: No scleral icterus.       Right eye: No discharge.        Left eye: No discharge.     Conjunctiva/sclera: Conjunctivae normal.     Pupils: Pupils are equal, round, and reactive to light.  Neck:     Vascular: No carotid bruit.  Cardiovascular:     Rate and Rhythm: Normal rate and regular rhythm.     Pulses: Normal pulses.     Heart sounds: Normal heart sounds. No murmur. No friction rub. No gallop.   Pulmonary:     Effort: Pulmonary effort is normal. No respiratory distress.     Breath sounds: Normal breath sounds. No wheezing, rhonchi or rales.  Chest:     Chest wall: No tenderness.  Abdominal:     General: Bowel sounds are normal. There is no distension.     Palpations: Abdomen is soft. There is no mass.     Tenderness: There is no abdominal tenderness. There is no right CVA tenderness, left CVA tenderness, guarding or rebound.  Musculoskeletal:        General: No swelling. Normal range of motion.     Cervical back: Normal range of motion. No rigidity or tenderness.     Thoracic back: Spasms and tenderness present.       Back:     Right lower leg: No edema.     Left lower leg: No edema.     Comments: Muscle tightness and tender to palpation   Lymphadenopathy:     Cervical: No cervical adenopathy.  Skin:    General: Skin is warm.     Coloration: Skin is not pale.     Findings: No bruising, erythema or rash.  Neurological:     Mental Status: She is alert and oriented to person, place, and time.     Cranial Nerves: No cranial nerve deficit.     Sensory: No sensory deficit.     Motor: No weakness.     Coordination: Coordination normal.      Gait:  Gait normal.  Psychiatric:        Mood and Affect: Mood normal.        Behavior: Behavior normal.        Thought Content: Thought content normal.        Judgment: Judgment normal.     Labs reviewed: Recent Labs    01/05/19 1504 01/05/19 1504 01/05/19 1509 01/06/19 0628 02/08/19 1317  NA 139   < > 140 140 136  K 3.7   < > 3.7 3.2* 3.0*  CL 104  --   --  110 97*  CO2 21*  --   --  21* 22  GLUCOSE 114*  --   --  91 125*  BUN 37*  --   --  27* 18  CREATININE 2.91*  --   --  1.05* 0.97  CALCIUM 9.1  --   --  8.3* 9.4   < > = values in this interval not displayed.   Recent Labs    01/05/19 2318  AST 13*  ALT 11  ALKPHOS 59  BILITOT 0.6  PROT 5.2*  ALBUMIN 3.2*   Recent Labs    01/05/19 1504 01/05/19 1504 01/05/19 1509 01/06/19 0628 02/08/19 1317  WBC 10.7*  --   --  5.5 20.4*  NEUTROABS 6.4  --   --   --   --   HGB 12.8   < > 12.9 10.7* 13.9  HCT 39.3   < > 38.0 32.4* 40.7  MCV 90.1  --   --  90.0 87.3  PLT 211  --   --  140* 291   < > = values in this interval not displayed.   Lab Results  Component Value Date   TSH 1.840 07/12/2013   Lab Results  Component Value Date   HGBA1C 5.8 (H) 01/06/2019   Lab Results  Component Value Date   CHOL 127 01/06/2019   HDL 29 (L) 01/06/2019   LDLCALC 82 01/06/2019   TRIG 79 01/06/2019   CHOLHDL 4.4 01/06/2019    Significant Diagnostic Results in last 30 days:  No results found.  Assessment/Plan 1. Back muscle spasm Upper back muscle tightness and tender to palpation.No Bruise noted.  - methocarbamol (ROBAXIN) 500 MG tablet; Take 1 tablet (500 mg total) by mouth every 8 (eight) hours as needed for up to 7 days for muscle spasms.  Dispense: 21 tablet; Refill: 0  2. Localized osteoporosis without current pathological fracture No recent fall episode.Request refills.  - alendronate (FOSAMAX) 70 MG tablet; Take with a full glass of water on an empty stomach.  Dispense: 12 tablet; Refill: 0  3.  Hypertension, unspecified type B/p elevated. Asymptomatic.Clonidine 0.1 mg tablet one by mouth given by CMA B/p rechecked 190/120 > 162/102 > 130/80 after resting for 15 minutes.Continue on losartan 50 mg tablet daily.On ASA for prophylaxis.continue on Nitro for chest pain  - cloNIDine (CATAPRES) tablet 0.1 mg  Family/ staff Communication: Reviewed plan of care with patient  Labs/tests ordered: None   Next Appointment: 3 months for physical exam.Reschedule today's AWV   Sandrea Hughs, NP

## 2019-05-10 ENCOUNTER — Encounter (HOSPITAL_COMMUNITY): Payer: Self-pay

## 2019-05-10 ENCOUNTER — Telehealth: Payer: Self-pay | Admitting: Family

## 2019-05-10 ENCOUNTER — Inpatient Hospital Stay (HOSPITAL_COMMUNITY)
Admission: EM | Admit: 2019-05-10 | Discharge: 2019-05-15 | DRG: 220 | Disposition: A | Discharge status: Home / Self Care | Payer: Medicare HMO | Attending: Cardiovascular Disease | Admitting: Cardiovascular Disease

## 2019-05-10 DIAGNOSIS — G629 Polyneuropathy, unspecified: Secondary | ICD-10-CM | POA: Diagnosis present

## 2019-05-10 DIAGNOSIS — I491 Atrial premature depolarization: Secondary | ICD-10-CM | POA: Diagnosis present

## 2019-05-10 DIAGNOSIS — Z20822 Contact with and (suspected) exposure to covid-19: Secondary | ICD-10-CM | POA: Diagnosis present

## 2019-05-10 DIAGNOSIS — M62830 Muscle spasm of back: Secondary | ICD-10-CM

## 2019-05-10 DIAGNOSIS — R739 Hyperglycemia, unspecified: Secondary | ICD-10-CM | POA: Diagnosis present

## 2019-05-10 DIAGNOSIS — M545 Low back pain: Secondary | ICD-10-CM | POA: Diagnosis present

## 2019-05-10 DIAGNOSIS — Z79899 Other long term (current) drug therapy: Secondary | ICD-10-CM

## 2019-05-10 DIAGNOSIS — G8929 Other chronic pain: Secondary | ICD-10-CM | POA: Diagnosis present

## 2019-05-10 DIAGNOSIS — Z7983 Long term (current) use of bisphosphonates: Secondary | ICD-10-CM

## 2019-05-10 DIAGNOSIS — Z8379 Family history of other diseases of the digestive system: Secondary | ICD-10-CM

## 2019-05-10 DIAGNOSIS — Z90721 Acquired absence of ovaries, unilateral: Secondary | ICD-10-CM

## 2019-05-10 DIAGNOSIS — F419 Anxiety disorder, unspecified: Secondary | ICD-10-CM | POA: Diagnosis present

## 2019-05-10 DIAGNOSIS — Z7902 Long term (current) use of antithrombotics/antiplatelets: Secondary | ICD-10-CM

## 2019-05-10 DIAGNOSIS — Z803 Family history of malignant neoplasm of breast: Secondary | ICD-10-CM

## 2019-05-10 DIAGNOSIS — Z90710 Acquired absence of both cervix and uterus: Secondary | ICD-10-CM

## 2019-05-10 DIAGNOSIS — I2510 Atherosclerotic heart disease of native coronary artery without angina pectoris: Secondary | ICD-10-CM | POA: Diagnosis present

## 2019-05-10 DIAGNOSIS — I711 Thoracic aortic aneurysm, ruptured, unspecified: Secondary | ICD-10-CM

## 2019-05-10 DIAGNOSIS — K760 Fatty (change of) liver, not elsewhere classified: Secondary | ICD-10-CM | POA: Diagnosis present

## 2019-05-10 DIAGNOSIS — Z880 Allergy status to penicillin: Secondary | ICD-10-CM

## 2019-05-10 DIAGNOSIS — J449 Chronic obstructive pulmonary disease, unspecified: Secondary | ICD-10-CM | POA: Diagnosis present

## 2019-05-10 DIAGNOSIS — M546 Pain in thoracic spine: Secondary | ICD-10-CM | POA: Diagnosis not present

## 2019-05-10 DIAGNOSIS — Z87310 Personal history of (healed) osteoporosis fracture: Secondary | ICD-10-CM

## 2019-05-10 DIAGNOSIS — I712 Thoracic aortic aneurysm, without rupture, unspecified: Secondary | ICD-10-CM

## 2019-05-10 DIAGNOSIS — F329 Major depressive disorder, single episode, unspecified: Secondary | ICD-10-CM | POA: Diagnosis present

## 2019-05-10 DIAGNOSIS — D62 Acute posthemorrhagic anemia: Secondary | ICD-10-CM | POA: Diagnosis not present

## 2019-05-10 DIAGNOSIS — Z7951 Long term (current) use of inhaled steroids: Secondary | ICD-10-CM

## 2019-05-10 DIAGNOSIS — Z7982 Long term (current) use of aspirin: Secondary | ICD-10-CM

## 2019-05-10 DIAGNOSIS — Z882 Allergy status to sulfonamides status: Secondary | ICD-10-CM

## 2019-05-10 DIAGNOSIS — Z681 Body mass index (BMI) 19 or less, adult: Secondary | ICD-10-CM

## 2019-05-10 DIAGNOSIS — Z823 Family history of stroke: Secondary | ICD-10-CM

## 2019-05-10 DIAGNOSIS — Z818 Family history of other mental and behavioral disorders: Secondary | ICD-10-CM

## 2019-05-10 DIAGNOSIS — F17210 Nicotine dependence, cigarettes, uncomplicated: Secondary | ICD-10-CM | POA: Diagnosis present

## 2019-05-10 DIAGNOSIS — Z9049 Acquired absence of other specified parts of digestive tract: Secondary | ICD-10-CM

## 2019-05-10 DIAGNOSIS — K219 Gastro-esophageal reflux disease without esophagitis: Secondary | ICD-10-CM | POA: Diagnosis present

## 2019-05-10 DIAGNOSIS — Z888 Allergy status to other drugs, medicaments and biological substances status: Secondary | ICD-10-CM

## 2019-05-10 DIAGNOSIS — M810 Age-related osteoporosis without current pathological fracture: Secondary | ICD-10-CM | POA: Diagnosis present

## 2019-05-10 DIAGNOSIS — G2581 Restless legs syndrome: Secondary | ICD-10-CM | POA: Diagnosis present

## 2019-05-10 DIAGNOSIS — Z881 Allergy status to other antibiotic agents status: Secondary | ICD-10-CM

## 2019-05-10 DIAGNOSIS — E440 Moderate protein-calorie malnutrition: Secondary | ICD-10-CM | POA: Diagnosis present

## 2019-05-10 DIAGNOSIS — Z833 Family history of diabetes mellitus: Secondary | ICD-10-CM

## 2019-05-10 DIAGNOSIS — G309 Alzheimer's disease, unspecified: Secondary | ICD-10-CM | POA: Diagnosis present

## 2019-05-10 DIAGNOSIS — I10 Essential (primary) hypertension: Secondary | ICD-10-CM | POA: Diagnosis present

## 2019-05-10 DIAGNOSIS — F0280 Dementia in other diseases classified elsewhere without behavioral disturbance: Secondary | ICD-10-CM | POA: Diagnosis present

## 2019-05-10 DIAGNOSIS — Z825 Family history of asthma and other chronic lower respiratory diseases: Secondary | ICD-10-CM

## 2019-05-10 DIAGNOSIS — Z8249 Family history of ischemic heart disease and other diseases of the circulatory system: Secondary | ICD-10-CM

## 2019-05-10 DIAGNOSIS — Z8673 Personal history of transient ischemic attack (TIA), and cerebral infarction without residual deficits: Secondary | ICD-10-CM

## 2019-05-10 NOTE — ED Triage Notes (Signed)
Pt comes via [facility] for back pain that has been going on for over a week, PCP sent her to a back doctor that gave her muscle relaxer's and pain is not better. Denies injury

## 2019-05-10 NOTE — Telephone Encounter (Signed)
Pt left vm stating she seen Dinah for back pain & it hasn't gotten any better. She would like for Dinah to refer her to someone that can help.  Kambree said Carilyn Goodpasture mentioned referral at her last visit Please call [phone]  Thanks, Misty Stanley

## 2019-05-10 NOTE — Telephone Encounter (Signed)
Referral order placed to Orthopedic for further evaluation.

## 2019-05-11 ENCOUNTER — Inpatient Hospital Stay (HOSPITAL_COMMUNITY): Payer: Medicare HMO

## 2019-05-11 ENCOUNTER — Inpatient Hospital Stay (HOSPITAL_COMMUNITY): Payer: Medicare HMO | Admitting: Anesthesiology

## 2019-05-11 ENCOUNTER — Encounter (HOSPITAL_COMMUNITY)
Admission: EM | Disposition: A | Discharge status: Home / Self Care | Payer: Self-pay | Source: Home / Self Care | Attending: Cardiovascular Disease

## 2019-05-11 ENCOUNTER — Encounter (HOSPITAL_COMMUNITY): Payer: Self-pay | Admitting: Radiology

## 2019-05-11 ENCOUNTER — Emergency Department (HOSPITAL_COMMUNITY): Payer: Medicare HMO

## 2019-05-11 DIAGNOSIS — I711 Thoracic aortic aneurysm, ruptured, unspecified: Secondary | ICD-10-CM | POA: Diagnosis present

## 2019-05-11 DIAGNOSIS — I10 Essential (primary) hypertension: Secondary | ICD-10-CM | POA: Diagnosis present

## 2019-05-11 DIAGNOSIS — K760 Fatty (change of) liver, not elsewhere classified: Secondary | ICD-10-CM | POA: Diagnosis present

## 2019-05-11 DIAGNOSIS — G629 Polyneuropathy, unspecified: Secondary | ICD-10-CM | POA: Diagnosis present

## 2019-05-11 DIAGNOSIS — D62 Acute posthemorrhagic anemia: Secondary | ICD-10-CM | POA: Diagnosis not present

## 2019-05-11 DIAGNOSIS — I712 Thoracic aortic aneurysm, without rupture: Secondary | ICD-10-CM

## 2019-05-11 DIAGNOSIS — Z20822 Contact with and (suspected) exposure to covid-19: Secondary | ICD-10-CM | POA: Diagnosis present

## 2019-05-11 DIAGNOSIS — M810 Age-related osteoporosis without current pathological fracture: Secondary | ICD-10-CM | POA: Diagnosis present

## 2019-05-11 DIAGNOSIS — G8929 Other chronic pain: Secondary | ICD-10-CM | POA: Diagnosis present

## 2019-05-11 DIAGNOSIS — Z9049 Acquired absence of other specified parts of digestive tract: Secondary | ICD-10-CM | POA: Diagnosis not present

## 2019-05-11 DIAGNOSIS — I2510 Atherosclerotic heart disease of native coronary artery without angina pectoris: Secondary | ICD-10-CM | POA: Diagnosis present

## 2019-05-11 DIAGNOSIS — I491 Atrial premature depolarization: Secondary | ICD-10-CM | POA: Diagnosis present

## 2019-05-11 DIAGNOSIS — R739 Hyperglycemia, unspecified: Secondary | ICD-10-CM | POA: Diagnosis present

## 2019-05-11 DIAGNOSIS — F329 Major depressive disorder, single episode, unspecified: Secondary | ICD-10-CM | POA: Diagnosis present

## 2019-05-11 DIAGNOSIS — G2581 Restless legs syndrome: Secondary | ICD-10-CM | POA: Diagnosis present

## 2019-05-11 DIAGNOSIS — K219 Gastro-esophageal reflux disease without esophagitis: Secondary | ICD-10-CM | POA: Diagnosis present

## 2019-05-11 DIAGNOSIS — J449 Chronic obstructive pulmonary disease, unspecified: Secondary | ICD-10-CM | POA: Diagnosis present

## 2019-05-11 DIAGNOSIS — G309 Alzheimer's disease, unspecified: Secondary | ICD-10-CM | POA: Diagnosis present

## 2019-05-11 DIAGNOSIS — F17210 Nicotine dependence, cigarettes, uncomplicated: Secondary | ICD-10-CM | POA: Diagnosis present

## 2019-05-11 DIAGNOSIS — F0280 Dementia in other diseases classified elsewhere without behavioral disturbance: Secondary | ICD-10-CM | POA: Diagnosis present

## 2019-05-11 DIAGNOSIS — F419 Anxiety disorder, unspecified: Secondary | ICD-10-CM | POA: Diagnosis present

## 2019-05-11 DIAGNOSIS — Z681 Body mass index (BMI) 19 or less, adult: Secondary | ICD-10-CM | POA: Diagnosis not present

## 2019-05-11 DIAGNOSIS — M545 Low back pain: Secondary | ICD-10-CM | POA: Diagnosis present

## 2019-05-11 DIAGNOSIS — E440 Moderate protein-calorie malnutrition: Secondary | ICD-10-CM | POA: Diagnosis present

## 2019-05-11 DIAGNOSIS — Z90710 Acquired absence of both cervix and uterus: Secondary | ICD-10-CM | POA: Diagnosis not present

## 2019-05-11 DIAGNOSIS — M546 Pain in thoracic spine: Secondary | ICD-10-CM | POA: Diagnosis present

## 2019-05-11 HISTORY — PX: THORACIC AORTIC ENDOVASCULAR STENT GRAFT: SHX6112

## 2019-05-11 LAB — BASIC METABOLIC PANEL
Anion gap: 11 (ref 5–15)
Anion gap: 10 (ref 5–15)
BUN: 7 mg/dL — ABNORMAL LOW (ref 8–23)
BUN: 10 mg/dL (ref 8–23)
CO2: 27 mmol/L (ref 22–32)
CO2: 21 mmol/L — ABNORMAL LOW (ref 22–32)
Calcium: 9 mg/dL (ref 8.9–10.3)
Calcium: 7.9 mg/dL — ABNORMAL LOW (ref 8.9–10.3)
Chloride: 101 mmol/L (ref 98–111)
Chloride: 107 mmol/L (ref 98–111)
Creatinine, Ser: 0.67 mg/dL (ref 0.44–1.00)
Creatinine, Ser: 0.7 mg/dL (ref 0.44–1.00)
GFR calc Af Amer: >60 mL/min (ref >60)
GFR calc Af Amer: >60 mL/min (ref >60)
GFR calc non Af Amer: >60 mL/min (ref >60)
GFR calc non Af Amer: >60 mL/min (ref >60)
Glucose, Bld: 125 mg/dL — ABNORMAL HIGH (ref 70–99)
Glucose, Bld: 164 mg/dL — ABNORMAL HIGH (ref 70–99)
Potassium: 4 mmol/L (ref 3.5–5.1)
Potassium: 4.2 mmol/L (ref 3.5–5.1)
Sodium: 139 mmol/L (ref 135–145)
Sodium: 138 mmol/L (ref 135–145)

## 2019-05-11 LAB — CBC WITH DIFFERENTIAL/PLATELET
Abs Immature Granulocytes: 0.04 10*3/uL (ref 0.00–0.07)
Basophils Absolute: 0.1 10*3/uL (ref 0.0–0.1)
Basophils Relative: 1 %
Eosinophils Absolute: 0.2 10*3/uL (ref 0.0–0.5)
Eosinophils Relative: 2 %
HCT: 31.2 % — ABNORMAL LOW (ref 36.0–46.0)
Hemoglobin: 9.5 g/dL — ABNORMAL LOW (ref 12.0–15.0)
Immature Granulocytes: 0 %
Lymphocytes Relative: 22 %
Lymphs Abs: 2.3 10*3/uL (ref 0.7–4.0)
MCH: 24.6 pg — ABNORMAL LOW (ref 26.0–34.0)
MCHC: 30.4 g/dL (ref 30.0–36.0)
MCV: 80.8 fL (ref 80.0–100.0)
Monocytes Absolute: 0.8 10*3/uL (ref 0.1–1.0)
Monocytes Relative: 7 %
Neutro Abs: 7.1 10*3/uL (ref 1.7–7.7)
Neutrophils Relative %: 68 %
Platelets: 457 10*3/uL — ABNORMAL HIGH (ref 150–400)
RBC: 3.86 MIL/uL — ABNORMAL LOW (ref 3.87–5.11)
RDW: 15 % (ref 11.5–15.5)
WBC: 10.4 10*3/uL (ref 4.0–10.5)
nRBC: 0 % (ref 0.0–0.2)

## 2019-05-11 LAB — CBC
HCT: 27.6 % — ABNORMAL LOW (ref 36.0–46.0)
Hemoglobin: 8.3 g/dL — ABNORMAL LOW (ref 12.0–15.0)
MCH: 24.3 pg — ABNORMAL LOW (ref 26.0–34.0)
MCHC: 30.1 g/dL (ref 30.0–36.0)
MCV: 80.9 fL (ref 80.0–100.0)
Platelets: 392 10*3/uL (ref 150–400)
RBC: 3.41 MIL/uL — ABNORMAL LOW (ref 3.87–5.11)
RDW: 15 % (ref 11.5–15.5)
WBC: 15.7 10*3/uL — ABNORMAL HIGH (ref 4.0–10.5)
nRBC: 0 % (ref 0.0–0.2)

## 2019-05-11 LAB — MAGNESIUM: Magnesium: 1.7 mg/dL (ref 1.7–2.4)

## 2019-05-11 LAB — TROPONIN I (HIGH SENSITIVITY)
Troponin I (High Sensitivity): 9 ng/L (ref <18)
Troponin I (High Sensitivity): 4 ng/L (ref <18)

## 2019-05-11 LAB — PROTIME-INR
INR: 1.3 — ABNORMAL HIGH (ref 0.8–1.2)
Prothrombin Time: 15.8 seconds — ABNORMAL HIGH (ref 11.4–15.2)

## 2019-05-11 LAB — RESPIRATORY PANEL BY RT PCR (FLU A&B, COVID)
Influenza A by PCR: NEGATIVE
Influenza B by PCR: NEGATIVE
SARS Coronavirus 2 by RT PCR: NEGATIVE

## 2019-05-11 LAB — PREPARE RBC (CROSSMATCH)

## 2019-05-11 LAB — LACTIC ACID, PLASMA
Lactic Acid, Venous: 1.3 mmol/L (ref 0.5–1.9)
Lactic Acid, Venous: 1.1 mmol/L (ref 0.5–1.9)

## 2019-05-11 LAB — POC SARS CORONAVIRUS 2 AG -  ED: SARS Coronavirus 2 Ag: NEGATIVE

## 2019-05-11 LAB — APTT: aPTT: 41 seconds — ABNORMAL HIGH (ref 24–36)

## 2019-05-11 LAB — RAPID HIV SCREEN (HIV 1/2 AB+AG)
HIV 1/2 Antibodies: NONREACTIVE
HIV-1 P24 Antigen - HIV24: NONREACTIVE

## 2019-05-11 LAB — MRSA PCR SCREENING: MRSA by PCR: NEGATIVE

## 2019-05-11 LAB — D-DIMER, QUANTITATIVE: D-Dimer, Quant: 1.61 ug/mL-FEU — ABNORMAL HIGH (ref 0.00–0.50)

## 2019-05-11 LAB — ABO/RH: ABO/RH(D): A POS

## 2019-05-11 SURGERY — INSERTION, ENDOVASCULAR STENT GRAFT, AORTA, THORACIC
Anesthesia: General

## 2019-05-11 MED ORDER — CEFAZOLIN SODIUM-DEXTROSE 1-4 GM/50ML-% IV SOLN
INTRAVENOUS | Status: AC
  Filled 2019-05-11: qty 50

## 2019-05-11 MED ORDER — SODIUM CHLORIDE 0.9 % IV SOLN
INTRAVENOUS | Status: DC
  Administered 2019-05-11: 1000 mL via INTRAVENOUS

## 2019-05-11 MED ORDER — ASPIRIN EC 81 MG PO TBEC
81.0000 mg | DELAYED_RELEASE_TABLET | Freq: Every day | ORAL | Status: DC
  Administered 2019-05-12 – 2019-05-15 (×4): 81 mg via ORAL
  Filled 2019-05-11 (×4): qty 1

## 2019-05-11 MED ORDER — ALUM & MAG HYDROXIDE-SIMETH 200-200-20 MG/5ML PO SUSP: 15.0000 mL | ORAL | Status: DC | PRN

## 2019-05-11 MED ORDER — HYDROMORPHONE HCL 1 MG/ML IJ SOLN
0.2500 mg | INTRAMUSCULAR | Status: DC | PRN
  Administered 2019-05-11: 0.25 mg via INTRAVENOUS
  Administered 2019-05-11: 0.5 mg via INTRAVENOUS
  Administered 2019-05-11 (×3): 0.25 mg via INTRAVENOUS
  Administered 2019-05-11: 0.5 mg via INTRAVENOUS

## 2019-05-11 MED ORDER — DOCUSATE SODIUM 100 MG PO CAPS
100.0000 mg | ORAL_CAPSULE | Freq: Every day | ORAL | Status: DC
  Administered 2019-05-12: 12:00:00 100 mg via ORAL
  Filled 2019-05-11: qty 1

## 2019-05-11 MED ORDER — LIDOCAINE 2% (20 MG/ML) 5 ML SYRINGE
INTRAMUSCULAR | Status: DC | PRN
  Administered 2019-05-11: 100 mg via INTRAVENOUS

## 2019-05-11 MED ORDER — ROCURONIUM BROMIDE 10 MG/ML (PF) SYRINGE
PREFILLED_SYRINGE | INTRAVENOUS | Status: DC | PRN
  Administered 2019-05-11: 80 mg via INTRAVENOUS

## 2019-05-11 MED ORDER — HEPARIN SODIUM (PORCINE) 1000 UNIT/ML IJ SOLN
INTRAMUSCULAR | Status: AC
  Filled 2019-05-11: qty 1

## 2019-05-11 MED ORDER — MORPHINE SULFATE (PF) 4 MG/ML IV SOLN
4.0000 mg | Freq: Once | INTRAVENOUS | Status: AC
  Administered 2019-05-11: 4 mg via INTRAVENOUS
  Filled 2019-05-11: qty 1

## 2019-05-11 MED ORDER — PHENYLEPHRINE 40 MCG/ML (10ML) SYRINGE FOR IV PUSH (FOR BLOOD PRESSURE SUPPORT)
PREFILLED_SYRINGE | INTRAVENOUS | Status: DC | PRN
  Administered 2019-05-11: 80 ug via INTRAVENOUS
  Administered 2019-05-11: 40 ug via INTRAVENOUS
  Administered 2019-05-11: 80 ug via INTRAVENOUS
  Administered 2019-05-11: 120 ug via INTRAVENOUS
  Administered 2019-05-11: 40 ug via INTRAVENOUS
  Administered 2019-05-11: 120 ug via INTRAVENOUS

## 2019-05-11 MED ORDER — ESMOLOL BOLUS VIA INFUSION
250.0000 ug/kg | Freq: Once | INTRAVENOUS | Status: AC
  Administered 2019-05-11: 11825 ug via INTRAVENOUS
  Filled 2019-05-11: qty 12000

## 2019-05-11 MED ORDER — ACETAMINOPHEN 500 MG PO TABS
1000.0000 mg | ORAL_TABLET | Freq: Once | ORAL | Status: AC
  Administered 2019-05-11: 1000 mg via ORAL
  Filled 2019-05-11: qty 2

## 2019-05-11 MED ORDER — SUGAMMADEX SODIUM 200 MG/2ML IV SOLN
INTRAVENOUS | Status: DC | PRN
  Administered 2019-05-11: 200 mg via INTRAVENOUS

## 2019-05-11 MED ORDER — CHLORHEXIDINE GLUCONATE CLOTH 2 % EX PADS
6.0000 | MEDICATED_PAD | Freq: Every day | CUTANEOUS | Status: DC
  Administered 2019-05-11 – 2019-05-15 (×5): 6 via TOPICAL

## 2019-05-11 MED ORDER — OXYCODONE-ACETAMINOPHEN 5-325 MG PO TABS
1.0000 | ORAL_TABLET | ORAL | Status: DC | PRN
  Administered 2019-05-11 – 2019-05-15 (×18): 2 via ORAL
  Filled 2019-05-11 (×19): qty 2

## 2019-05-11 MED ORDER — BISACODYL 10 MG RE SUPP
10.0000 mg | Freq: Every day | RECTAL | Status: DC | PRN
  Administered 2019-05-14: 15:00:00 10 mg via RECTAL
  Filled 2019-05-11: qty 1

## 2019-05-11 MED ORDER — SODIUM CHLORIDE 0.9% IV SOLUTION: Freq: Once | INTRAVENOUS | Status: AC

## 2019-05-11 MED ORDER — ONDANSETRON HCL 4 MG/2ML IJ SOLN
4.0000 mg | Freq: Once | INTRAMUSCULAR | Status: AC
  Administered 2019-05-11: 04:00:00 4 mg via INTRAVENOUS
  Filled 2019-05-11: qty 2

## 2019-05-11 MED ORDER — IOHEXOL 350 MG/ML SOLN
80.0000 mL | Freq: Once | INTRAVENOUS | Status: AC | PRN
  Administered 2019-05-11: 140 mL via INTRAVENOUS

## 2019-05-11 MED ORDER — SODIUM CHLORIDE 0.9 % IV SOLN
INTRAVENOUS | Status: DC | PRN
  Administered 2019-05-11: 500 mL

## 2019-05-11 MED ORDER — LABETALOL HCL 5 MG/ML IV SOLN
INTRAVENOUS | Status: DC | PRN
  Administered 2019-05-11 (×2): 10 mg via INTRAVENOUS

## 2019-05-11 MED ORDER — CEFAZOLIN SODIUM-DEXTROSE 2-4 GM/100ML-% IV SOLN
2.0000 g | Freq: Three times a day (TID) | INTRAVENOUS | Status: AC
  Administered 2019-05-11 – 2019-05-12 (×2): 2 g via INTRAVENOUS
  Filled 2019-05-11 (×2): qty 100

## 2019-05-11 MED ORDER — HEPARIN SODIUM (PORCINE) 1000 UNIT/ML IJ SOLN
INTRAMUSCULAR | Status: DC | PRN
  Administered 2019-05-11: 2000 [IU] via INTRAVENOUS
  Administered 2019-05-11: 5000 [IU] via INTRAVENOUS
  Administered 2019-05-11: 1000 [IU] via INTRAVENOUS

## 2019-05-11 MED ORDER — HYDROMORPHONE HCL 1 MG/ML IJ SOLN
INTRAMUSCULAR | Status: AC
  Filled 2019-05-11: qty 1

## 2019-05-11 MED ORDER — MIDAZOLAM HCL 2 MG/2ML IJ SOLN
INTRAMUSCULAR | Status: AC
  Filled 2019-05-11: qty 2

## 2019-05-11 MED ORDER — PROTAMINE SULFATE 10 MG/ML IV SOLN
INTRAVENOUS | Status: DC | PRN
  Administered 2019-05-11: 30 mg via INTRAVENOUS
  Administered 2019-05-11: 10 mg via INTRAVENOUS

## 2019-05-11 MED ORDER — ORAL CARE MOUTH RINSE
15.0000 mL | Freq: Two times a day (BID) | OROMUCOSAL | Status: DC
  Administered 2019-05-12 – 2019-05-14 (×6): 15 mL via OROMUCOSAL

## 2019-05-11 MED ORDER — 0.9 % SODIUM CHLORIDE (POUR BTL) OPTIME
TOPICAL | Status: DC | PRN
  Administered 2019-05-11: 1000 mL

## 2019-05-11 MED ORDER — ACETAMINOPHEN 325 MG RE SUPP: 325.0000 mg | RECTAL | Status: DC | PRN

## 2019-05-11 MED ORDER — LABETALOL HCL 5 MG/ML IV SOLN
10.0000 mg | INTRAVENOUS | Status: DC | PRN
  Administered 2019-05-11: 10 mg via INTRAVENOUS
  Filled 2019-05-11: qty 4

## 2019-05-11 MED ORDER — METOPROLOL TARTRATE 5 MG/5ML IV SOLN: 2.0000 mg | INTRAVENOUS | Status: DC | PRN

## 2019-05-11 MED ORDER — ONDANSETRON HCL 4 MG/2ML IJ SOLN
INTRAMUSCULAR | Status: DC | PRN
  Administered 2019-05-11: 4 mg via INTRAVENOUS

## 2019-05-11 MED ORDER — HYDRALAZINE HCL 20 MG/ML IJ SOLN: 5.0000 mg | INTRAMUSCULAR | Status: DC | PRN

## 2019-05-11 MED ORDER — GUAIFENESIN-DM 100-10 MG/5ML PO SYRP: 15.0000 mL | ORAL_SOLUTION | ORAL | Status: DC | PRN

## 2019-05-11 MED ORDER — MORPHINE SULFATE (PF) 2 MG/ML IV SOLN
2.0000 mg | INTRAVENOUS | Status: DC | PRN
  Administered 2019-05-11 – 2019-05-13 (×7): 2 mg via INTRAVENOUS
  Filled 2019-05-11 (×7): qty 1

## 2019-05-11 MED ORDER — PROPOFOL 10 MG/ML IV BOLUS
INTRAVENOUS | Status: DC | PRN
  Administered 2019-05-11: 70 mg via INTRAVENOUS

## 2019-05-11 MED ORDER — PANTOPRAZOLE SODIUM 40 MG PO TBEC
40.0000 mg | DELAYED_RELEASE_TABLET | Freq: Every day | ORAL | Status: DC
  Administered 2019-05-12 – 2019-05-15 (×4): 40 mg via ORAL
  Filled 2019-05-11 (×4): qty 1

## 2019-05-11 MED ORDER — IODIXANOL 320 MG/ML IV SOLN
INTRAVENOUS | Status: DC | PRN
  Administered 2019-05-11: 150 mL

## 2019-05-11 MED ORDER — FENTANYL CITRATE (PF) 100 MCG/2ML IJ SOLN
INTRAMUSCULAR | Status: AC
  Filled 2019-05-11: qty 2

## 2019-05-11 MED ORDER — SODIUM CHLORIDE 0.9% FLUSH
10.0000 mL | Freq: Two times a day (BID) | INTRAVENOUS | Status: DC
  Administered 2019-05-12 – 2019-05-14 (×6): 10 mL

## 2019-05-11 MED ORDER — DEXAMETHASONE SODIUM PHOSPHATE 10 MG/ML IJ SOLN
INTRAMUSCULAR | Status: DC | PRN
  Administered 2019-05-11: 10 mg via INTRAVENOUS

## 2019-05-11 MED ORDER — POTASSIUM CHLORIDE CRYS ER 20 MEQ PO TBCR: 20.0000 meq | EXTENDED_RELEASE_TABLET | Freq: Every day | ORAL | Status: DC | PRN

## 2019-05-11 MED ORDER — ONDANSETRON HCL 4 MG/2ML IJ SOLN: 4.0000 mg | Freq: Four times a day (QID) | INTRAMUSCULAR | Status: DC | PRN

## 2019-05-11 MED ORDER — SODIUM CHLORIDE 0.9% FLUSH: 10.0000 mL | INTRAVENOUS | Status: DC | PRN

## 2019-05-11 MED ORDER — ESMOLOL HCL-SODIUM CHLORIDE 2000 MG/100ML IV SOLN
25.0000 ug/kg/min | INTRAVENOUS | Status: DC
  Administered 2019-05-11: 09:00:00 25 ug/kg/min via INTRAVENOUS
  Filled 2019-05-11: qty 100

## 2019-05-11 MED ORDER — ATORVASTATIN CALCIUM 10 MG PO TABS
10.0000 mg | ORAL_TABLET | Freq: Every day | ORAL | Status: DC
  Administered 2019-05-11 – 2019-05-14 (×4): 10 mg via ORAL
  Filled 2019-05-11 (×4): qty 1

## 2019-05-11 MED ORDER — ONDANSETRON HCL 4 MG/2ML IJ SOLN: 4.0000 mg | Freq: Once | INTRAMUSCULAR | Status: DC | PRN

## 2019-05-11 MED ORDER — FENTANYL CITRATE (PF) 100 MCG/2ML IJ SOLN
INTRAMUSCULAR | Status: DC | PRN
  Administered 2019-05-11: 25 ug via INTRAVENOUS
  Administered 2019-05-11: 50 ug via INTRAVENOUS
  Administered 2019-05-11 (×3): 25 ug via INTRAVENOUS
  Administered 2019-05-11: 150 ug via INTRAVENOUS
  Administered 2019-05-11 (×2): 25 ug via INTRAVENOUS

## 2019-05-11 MED ORDER — PROTAMINE SULFATE 10 MG/ML IV SOLN
INTRAVENOUS | Status: AC
Start: 2019-05-11 — End: ?
  Filled 2019-05-11: qty 25

## 2019-05-11 MED ORDER — SODIUM CHLORIDE 0.9 % IV SOLN: 500.0000 mL | Freq: Once | INTRAVENOUS | Status: DC | PRN

## 2019-05-11 MED ORDER — HYDROMORPHONE HCL 1 MG/ML IJ SOLN: 0.2500 mg | Freq: Once | INTRAMUSCULAR | Status: DC

## 2019-05-11 MED ORDER — MAGNESIUM SULFATE 2 GM/50ML IV SOLN: 2.0000 g | Freq: Every day | INTRAVENOUS | Status: DC | PRN

## 2019-05-11 MED ORDER — FENTANYL CITRATE (PF) 250 MCG/5ML IJ SOLN
INTRAMUSCULAR | Status: AC
  Filled 2019-05-11: qty 5

## 2019-05-11 MED ORDER — LACTATED RINGERS IV SOLN: INTRAVENOUS | Status: DC | PRN

## 2019-05-11 MED ORDER — MIDAZOLAM HCL 5 MG/5ML IJ SOLN
INTRAMUSCULAR | Status: DC | PRN
  Administered 2019-05-11: 2 mg via INTRAVENOUS

## 2019-05-11 MED ORDER — MEPERIDINE HCL 25 MG/ML IJ SOLN: 6.2500 mg | INTRAMUSCULAR | Status: DC | PRN

## 2019-05-11 MED ORDER — ACETAMINOPHEN 325 MG PO TABS: 325.0000 mg | ORAL_TABLET | ORAL | Status: DC | PRN

## 2019-05-11 MED ORDER — SODIUM CHLORIDE 0.9 % IV SOLN
INTRAVENOUS | Status: AC
  Filled 2019-05-11: qty 1.2

## 2019-05-11 MED ORDER — CEFAZOLIN SODIUM-DEXTROSE 2-4 GM/100ML-% IV SOLN: 1.0000 g | Freq: Once | INTRAVENOUS | Status: DC

## 2019-05-11 MED ORDER — NICOTINE 14 MG/24HR TD PT24
14.0000 mg | MEDICATED_PATCH | Freq: Every day | TRANSDERMAL | Status: DC
  Administered 2019-05-11: 14 mg via TRANSDERMAL
  Filled 2019-05-11: qty 1

## 2019-05-11 MED ORDER — PHENYLEPHRINE HCL-NACL 10-0.9 MG/250ML-% IV SOLN
INTRAVENOUS | Status: DC | PRN
  Administered 2019-05-11: 50 ug/min via INTRAVENOUS

## 2019-05-11 MED ORDER — PHENOL 1.4 % MT LIQD: 1.0000 | OROMUCOSAL | Status: DC | PRN

## 2019-05-11 SURGICAL SUPPLY — 71 items
ADH SKN CLS APL DERMABOND .7 (GAUZE/BANDAGES/DRESSINGS) ×1
BLADE SURG 15 STRL LF DISP TIS (BLADE) IMPLANT
BLADE SURG 15 STRL SS (BLADE) ×2
CANISTER SUCT 3000ML PPV (MISCELLANEOUS) ×2 IMPLANT
CANNULA VESSEL 3MM 2 BLNT TIP (CANNULA) IMPLANT
CATH ACCU-VU SIZ PIG 5F 100CM (CATHETERS) ×1 IMPLANT
CATH ANGIO 5F BER2 100CM (CATHETERS) ×1 IMPLANT
CLIP VESOCCLUDE LG 6/CT (CLIP) ×1 IMPLANT
CLIP VESOCCLUDE MED 6/CT (CLIP) ×1 IMPLANT
CLIP VESOCCLUDE SM WIDE 24/CT (CLIP) ×1 IMPLANT
CLIP VESOCCLUDE SM WIDE 6/CT (CLIP) ×2 IMPLANT
COVER PROBE W GEL 5X96 (DRAPES) ×2 IMPLANT
COVER WAND RF STERILE (DRAPES) ×2 IMPLANT
DERMABOND ADVANCED (GAUZE/BANDAGES/DRESSINGS) ×1
DERMABOND ADVANCED .7 DNX12 (GAUZE/BANDAGES/DRESSINGS) ×1 IMPLANT
DEVICE CLOSURE PERCLS PRGLD 6F (VASCULAR PRODUCTS) IMPLANT
DRSG TEGADERM 2-3/8X2-3/4 SM (GAUZE/BANDAGES/DRESSINGS) ×2 IMPLANT
DRYSEAL FLEXSHEATH 20FR 33CM (SHEATH) ×1
ELECT CAUTERY BLADE 6.4 (BLADE) ×1 IMPLANT
ELECT REM PT RETURN 9FT ADLT (ELECTROSURGICAL) ×4
ELECTRODE REM PT RTRN 9FT ADLT (ELECTROSURGICAL) ×2 IMPLANT
GLOVE BIO SURGEON STRL SZ7.5 (GLOVE) ×3 IMPLANT
GLOVE BIOGEL M STRL SZ7.5 (GLOVE) ×1 IMPLANT
GLOVE BIOGEL PI IND STRL 7.0 (GLOVE) IMPLANT
GLOVE BIOGEL PI IND STRL 7.5 (GLOVE) IMPLANT
GLOVE BIOGEL PI IND STRL 8 (GLOVE) ×1 IMPLANT
GLOVE BIOGEL PI INDICATOR 7.0 (GLOVE) ×1
GLOVE BIOGEL PI INDICATOR 7.5 (GLOVE) ×2
GLOVE BIOGEL PI INDICATOR 8 (GLOVE) ×2
GLOVE ECLIPSE 7.0 STRL STRAW (GLOVE) ×1 IMPLANT
GOWN STRL REUS W/ TWL LRG LVL3 (GOWN DISPOSABLE) ×3 IMPLANT
GOWN STRL REUS W/TWL LRG LVL3 (GOWN DISPOSABLE) ×6
GRAFT HEMASHIELD 10MM (Graft) ×2 IMPLANT
GRAFT VASC STRG 30X10STRL (Graft) IMPLANT
INSERT FOGARTY SM (MISCELLANEOUS) ×2 IMPLANT
KIT BASIN OR (CUSTOM PROCEDURE TRAY) ×2 IMPLANT
KIT TURNOVER KIT B (KITS) ×2 IMPLANT
NDL PERC 18GX7CM (NEEDLE) ×1 IMPLANT
NEEDLE PERC 18GX7CM (NEEDLE) ×2 IMPLANT
NS IRRIG 1000ML POUR BTL (IV SOLUTION) ×4 IMPLANT
PACK ENDOVASCULAR (PACKS) ×2 IMPLANT
PAD ARMBOARD 7.5X6 YLW CONV (MISCELLANEOUS) ×4 IMPLANT
PENCIL BUTTON HOLSTER BLD 10FT (ELECTRODE) ×1 IMPLANT
PERCLOSE PROGLIDE 6F (VASCULAR PRODUCTS) ×4
SET MICROPUNCTURE 5F STIFF (MISCELLANEOUS) ×1 IMPLANT
SHEATH AVANTI 11CM 8FR (SHEATH) IMPLANT
SHEATH DRYSEAL FLEX 20FR 33CM (SHEATH) IMPLANT
SHEATH PINNACLE 5F 10CM (SHEATH) ×1 IMPLANT
SPONGE INTESTINAL PEANUT (DISPOSABLE) ×1 IMPLANT
STAPLER VISISTAT (STAPLE) IMPLANT
STENT GRFT THORAC ACS 26X21X10 (Endovascular Graft) ×2 IMPLANT
STOPCOCK MORSE 400PSI 3WAY (MISCELLANEOUS) ×2 IMPLANT
SUT PDS AB 1 CTX 36 (SUTURE) ×1 IMPLANT
SUT PDS AB 3-0 SH 27 (SUTURE) ×2 IMPLANT
SUT PROLENE 5 0 C 1 24 (SUTURE) ×3 IMPLANT
SUT SILK 3 0SH CR/8 30 (SUTURE) ×1 IMPLANT
SUT VIC AB 2-0 CT1 27 (SUTURE) ×2
SUT VIC AB 2-0 CT1 TAPERPNT 27 (SUTURE) IMPLANT
SUT VIC AB 2-0 CTB1 (SUTURE) IMPLANT
SUT VIC AB 3-0 SH 27 (SUTURE) ×6
SUT VIC AB 3-0 SH 27X BRD (SUTURE) IMPLANT
SUT VIC AB 4-0 PS2 18 (SUTURE) ×1 IMPLANT
SUT VICRYL 4-0 PS2 18IN ABS (SUTURE) ×2 IMPLANT
SYR 30ML LL (SYRINGE) IMPLANT
SYR 50ML LL SCALE MARK (SYRINGE) ×2 IMPLANT
SYR BULB IRRIGATION 50ML (SYRINGE) ×1 IMPLANT
TOWEL GREEN STERILE (TOWEL DISPOSABLE) ×2 IMPLANT
TRAY FOLEY MTR SLVR 16FR STAT (SET/KITS/TRAYS/PACK) ×2 IMPLANT
TUBING HIGH PRESSURE 120CM (CONNECTOR) ×2 IMPLANT
WIRE BENTSON .035X145CM (WIRE) ×1 IMPLANT
WIRE STIFF LUNDERQUIST 260CM (WIRE) ×1 IMPLANT

## 2019-05-11 NOTE — Transfer of Care (Signed)
Immediate Anesthesia Transfer of Care Note  Patient: Brittany [name]  Procedure(s) Performed: THORACIC AORTIC ENDOVASCULAR STENT GRAFT, RETROPERITONEAL CONDUIT RIGHT ILIAC ARTERY (N/A )  Patient Location: PACU  Anesthesia Type:General  Level of Consciousness: awake and alert   Airway & Oxygen Therapy: Patient Spontanous Breathing and Patient connected to nasal cannula oxygen  Post-op Assessment: Report given to RN and Post -op Vital signs reviewed and stable  Post vital signs: Reviewed and stable  Last Vitals:  Vitals Value Taken Time  BP    Temp    Pulse 83 05/11/19 1601  Resp 28 05/11/19 1601  SpO2 94 % 05/11/19 1601  Vitals shown include unvalidated device data.  Last Pain:  Vitals:   05/11/19 1555  TempSrc:   PainSc: 0-No pain         Complications: No apparent anesthesia complications

## 2019-05-11 NOTE — ED Notes (Signed)
Contacted CT and informed them that she now has an appropriate IV for CT

## 2019-05-11 NOTE — H&P (Signed)
Referring Physician:  [name] is an 64 y.o. female.                       Chief Complaint: Back pain  HPI: 64 years old female with PMH of hypertension, stroke, depression, COPD and Alzheimer disease has back pain x 1 week. She tried muscle relaxant given by her doctor without relief hence she came to ED for evaluation. Her EKG shows Sinus tachycardia with rare APC. Chest x-ray was unremarkable. For elevated D-dimer she had CT angio-chest which revealed 5.5 x 5.00 CM proximal to mid descending aortic aneurysm with contained rupture. She has chronic numbness in her feet and has h/o of osteoporosis with recurrent fractures in extremities with little trauma. She is on esmolol drip with good BP control. She is referred to vascular surgeon for possible stent placement.  Past Medical History:  Diagnosis Date  . Alzheimer disease ([place])   . Anxiety   . Bilateral pneumonia 01/2017   per new patient packet   . COPD (chronic obstructive pulmonary disease) ([place]) 01/2017   per new patient packet   . Depression   . Fatty liver   . Gallbladder disease 2014   per new patient packet   . Hernia, abdominal   . Hypertension   . Liver damage 01/2017   per new patient packet, Dr.Leith Hedlund  . Normal cardiac stress test 2007  . Ovarian cyst 1977   8 1/2 lb left ovarian cyst, Dr.Cox, per new patient packet   . Pneumonia   . Right ovarian cyst    1992, 1993, 1994, 1995, and 1996 Dr.Neil, per new patient packet   . Stroke ([place])    4 strokes.       Past Surgical History:  Procedure Laterality Date  . ABDOMINAL HYSTERECTOMY Bilateral    Total  . CESAREAN SECTION    . CHOLECYSTECTOMY    . INGUINAL HERNIA REPAIR Right   . LEFT OOPHORECTOMY  1977   with cyst removal, age 80  . MINOR EXCISION OF ORAL LESION N/A 10/07/2017   Procedure: IRRIGATION AND DEBRIDEMENT OF ORAL INFECTION, REMOVAL OF REMAINING 8 TEETH AND PLACEMENT OF PENROSE DRAINS;  Surgeon: Michael Litter, DMD;  Location: [facility];  Service: Oral  Surgery;  Laterality: N/A;  . OVARIAN CYST REMOVAL Right 89, 64,26, 89  . UMBILICAL HERNIA REPAIR      Family History  Problem Relation Age of Onset  . Stroke Mother   . COPD Mother   . Dementia Mother   . Breast cancer Mother   . Diabetes Father   . Heart attack Father   . Diabetes Sister   . Stroke Sister        x2  . Hepatitis C Daughter   . Diabetes Paternal Grandmother    Social History:  reports that she has been smoking cigarettes. She has a 23.00 pack-year smoking history. She has never used smokeless tobacco. She reports current alcohol use of about 1.0 standard drinks of alcohol per week. She reports previous drug use.  Allergies:  Allergies  Allergen Reactions  . Amoxicillin Hives and Rash     Has patient had a PCN reaction causing immediate rash, facial/tongue/throat swelling, SOB or lightheadedness with hypotension: Yes, hives Has patient had a PCN reaction causing severe rash involving mucus membranes or skin necrosis: No Has patient had a PCN reaction that required hospitalization No Has patient had a PCN reaction occurring within the last 10 years: Yes If all  of the above answers are "NO", then may proceed with Cephalosporin use.   . Clindamycin/Lincomycin Diarrhea and Other (See Comments)    Patient states that clindamycin causes diarrhea   . Ropinirole Other (See Comments)    "Made me insane" (per the patient)  . Wellbutrin [Bupropion] Diarrhea and Other (See Comments)    Lightheadedness, also  . Sulfamethoxazole Nausea And Vomiting and Rash  . Sulfonamide Derivatives Nausea And Vomiting and Rash    Medications Prior to Admission  Medication Sig Dispense Refill  . albuterol (PROVENTIL HFA;VENTOLIN HFA) 108 (90 Base) MCG/ACT inhaler Inhale 2 puffs into the lungs every 6 (six) hours as needed for wheezing or shortness of breath.    Marland Kitchen alendronate (FOSAMAX) 70 MG tablet Take with a full glass of water on an empty stomach. 12 tablet 0  . aspirin EC 81 MG EC  tablet Take 1 tablet (81 mg total) by mouth daily. 90 tablet 0  . atorvastatin (LIPITOR) 20 MG tablet Take 1 tablet (20 mg total) by mouth daily at 6 PM. 90 tablet 0  . budesonide-formoterol (SYMBICORT) 160-4.5 MCG/ACT inhaler Inhale 2 puffs into the lungs 2 (two) times daily as needed (for flares).     . citalopram (CELEXA) 40 MG tablet Take 0.5 tablets (20 mg total) by mouth daily for 30 days. 30 tablet 3  . clopidogrel (PLAVIX) 75 MG tablet Take 1 tablet (75 mg total) by mouth daily. 21 tablet 0  . gabapentin (NEURONTIN) 300 MG capsule Take 2 capsules (600 mg total) by mouth 2 (two) times daily. Take 600 mg by mouth in the morning and 600 mg at bedtime 120 capsule 11  . losartan (COZAAR) 50 MG tablet Take 1 tablet (50 mg total) daily by mouth. 30 tablet 1  . methocarbamol (ROBAXIN) 500 MG tablet Take 1 tablet (500 mg total) by mouth every 8 (eight) hours as needed for up to 7 days for muscle spasms. 21 tablet 0  . nitroGLYCERIN (NITROSTAT) 0.4 MG SL tablet Place 1 tablet (0.4 mg total) under the tongue every 5 (five) minutes x 3 doses as needed for chest pain (chest pain). 25 tablet 1    Results for orders placed or performed during the hospital encounter of 05/10/19 (from the past 48 hour(s))  CBC with Differential/Platelet     Status: Abnormal   Collection Time: 05/11/19  4:31 AM  Result Value Ref Range   WBC 10.4 4.0 - 10.5 K/uL   RBC 3.86 (L) 3.87 - 5.11 MIL/uL   Hemoglobin 9.5 (L) 12.0 - 15.0 g/dL   HCT 31.2 (L) 36.0 - 46.0 %   MCV 80.8 80.0 - 100.0 fL   MCH 24.6 (L) 26.0 - 34.0 pg   MCHC 30.4 30.0 - 36.0 g/dL   RDW 15.0 11.5 - 15.5 %   Platelets 457 (H) 150 - 400 K/uL   nRBC 0.0 0.0 - 0.2 %   Neutrophils Relative % 68 %   Neutro Abs 7.1 1.7 - 7.7 K/uL   Lymphocytes Relative 22 %   Lymphs Abs 2.3 0.7 - 4.0 K/uL   Monocytes Relative 7 %   Monocytes Absolute 0.8 0.1 - 1.0 K/uL   Eosinophils Relative 2 %   Eosinophils Absolute 0.2 0.0 - 0.5 K/uL   Basophils Relative 1 %    Basophils Absolute 0.1 0.0 - 0.1 K/uL   Immature Granulocytes 0 %   Abs Immature Granulocytes 0.04 0.00 - 0.07 K/uL    Comment: Performed at [facility],  [address], [place], [place] [address]  Basic metabolic panel     Status: Abnormal   Collection Time: 05/11/19  4:31 AM  Result Value Ref Range   Sodium 139 135 - 145 mmol/L   Potassium 4.0 3.5 - 5.1 mmol/L   Chloride 101 98 - 111 mmol/L   CO2 27 22 - 32 mmol/L   Glucose, Bld 125 (H) 70 - 99 mg/dL   BUN 7 (L) 8 - 23 mg/dL   Creatinine, Ser 0.67 0.44 - 1.00 mg/dL   Calcium 9.0 8.9 - 10.3 mg/dL   GFR calc non Af Amer >60 >60 mL/min   GFR calc Af Amer >60 >60 mL/min   Anion gap 11 5 - 15    Comment: Performed at [facility] [address], [place], [place] [address]  D-dimer, quantitative (not at [facility])     Status: Abnormal   Collection Time: 05/11/19  4:31 AM  Result Value Ref Range   D-Dimer, Quant 1.61 (H) 0.00 - 0.50 ug/mL-FEU    Comment: (NOTE) At the manufacturer cut-off of 0.50 ug/mL FEU, this assay has been documented to exclude PE with a sensitivity and negative predictive value of 97 to 99%.  At this time, this assay has not been approved by the FDA to exclude DVT/VTE. Results should be correlated with clinical presentation. Performed at [facility], [place] [address], [place], [place] [address]   Troponin I (High Sensitivity)     Status: None   Collection Time: 05/11/19  4:31 AM  Result Value Ref Range   Troponin I (High Sensitivity) 9 <18 ng/L    Comment: (NOTE) Elevated high sensitivity troponin I (hsTnI) values and significant  changes across serial measurements may suggest ACS but many other  chronic and acute conditions are known to elevate hsTnI results.  Refer to the "Links" section for chest pain algorithms and additional  guidance. Performed at [facility], [place] [address], [place],  [address]   POC SARS Coronavirus 2 Ag-ED - Nasal Swab (BD Veritor Kit)     Status: None    Collection Time: 05/11/19  6:03 AM  Result Value Ref Range   SARS Coronavirus 2 Ag NEGATIVE NEGATIVE    Comment: (NOTE) SARS-CoV-2 antigen NOT DETECTED.  Negative results are presumptive.  Negative results do not preclude SARS-CoV-2 infection and should not be used as the sole basis for treatment or other patient management decisions, including infection  control decisions, particularly in the presence of clinical signs and  symptoms consistent with COVID-19, or in those who have been in contact with the virus.  Negative results must be combined with clinical observations, patient history, and epidemiological information. The expected result is Negative. Fact Sheet for Patients: PodPark.tn Fact Sheet for Healthcare Providers: GiftContent.is This test is not yet approved or cleared by the [place] FDA and  has been authorized for detection and/or diagnosis of SARS-CoV-2 by FDA under an Emergency Use Authorization (EUA).  This EUA will remain in effect (meaning this test can be used) for the duration of  the COVID-19 de claration under Section 564(b)(1) of the Act, 21 U.S.C. section 360bbb-3(b)(1), unless the authorization is terminated or revoked sooner.   Troponin I (High Sensitivity)     Status: None   Collection Time: 05/11/19  6:31 AM  Result Value Ref Range   Troponin I (High Sensitivity) 4 <18 ng/L    Comment: (NOTE) Elevated high sensitivity troponin I (hsTnI) values and significant  changes across serial measurements may suggest ACS but many other  chronic and acute conditions are known to elevate hsTnI results.  Refer to the "Links" section for chest pain algorithms and additional  guidance. Performed at [facility], [place] [address] [place], [place] [address]   Respiratory Panel by RT PCR (Flu A&B, Covid) - Nasopharyngeal Swab     Status: None   Collection Time: 05/11/19  8:59 AM   Specimen:  Nasopharyngeal Swab  Result Value Ref Range   SARS Coronavirus 2 by RT PCR NEGATIVE NEGATIVE    Comment: (NOTE) SARS-CoV-2 target nucleic acids are NOT DETECTED. The SARS-CoV-2 RNA is generally detectable in upper respiratoy specimens during the acute phase of infection. The lowest concentration of SARS-CoV-2 viral copies this assay can detect is 131 copies/mL. A negative result does not preclude SARS-Cov-2 infection and should not be used as the sole basis for treatment or other patient management decisions. A negative result may occur with  improper specimen collection/handling, submission of specimen other than nasopharyngeal swab, presence of viral mutation(s) within the areas targeted by this assay, and inadequate number of viral copies (<131 copies/mL). A negative result must be combined with clinical observations, patient history, and epidemiological information. The expected result is Negative. Fact Sheet for Patients:  PinkCheek.be Fact Sheet for Healthcare Providers:  GravelBags.it This test is not yet ap proved or cleared by the [place] FDA and  has been authorized for detection and/or diagnosis of SARS-CoV-2 by FDA under an Emergency Use Authorization (EUA). This EUA will remain  in effect (meaning this test can be used) for the duration of the COVID-19 declaration under Section 564(b)(1) of the Act, 21 U.S.C. section 360bbb-3(b)(1), unless the authorization is terminated or revoked sooner.    Influenza A by PCR NEGATIVE NEGATIVE   Influenza B by PCR NEGATIVE NEGATIVE    Comment: (NOTE) The Xpert Xpress SARS-CoV-2/FLU/RSV assay is intended as an aid in  the diagnosis of influenza from Nasopharyngeal swab specimens and  should not be used as a sole basis for treatment. Nasal washings and  aspirates are unacceptable for Xpert Xpress SARS-CoV-2/FLU/RSV  testing. Fact Sheet for  Patients: PinkCheek.be Fact Sheet for Healthcare Providers: GravelBags.it This test is not yet approved or cleared by the [place] FDA and  has been authorized for detection and/or diagnosis of SARS-CoV-2 by  FDA under an Emergency Use Authorization (EUA). This EUA will remain  in effect (meaning this test can be used) for the duration of the  Covid-19 declaration under Section 564(b)(1) of the Act, 21  U.S.C. section 360bbb-3(b)(1), unless the authorization is  terminated or revoked. Performed at [facility], [place] [address] [place], [place] [address]   Lactic acid, plasma     Status: None   Collection Time: 05/11/19 10:33 AM  Result Value Ref Range   Lactic Acid, Venous 1.3 0.5 - 1.9 mmol/L    Comment: Performed at [facility] [address], [place],  [address]  Rapid HIV screen (HIV 1/2 Ab+Ag) ([facility] Only)     Status: None   Collection Time: 05/11/19 10:33 AM  Result Value Ref Range   HIV-1 P24 Antigen - HIV24 NON REACTIVE NON REACTIVE    Comment: (NOTE) Detection of p24 may be inhibited by biotin in the sample, causing false negative results in acute infection.    HIV 1/2 Antibodies NON REACTIVE NON REACTIVE   Interpretation (HIV Ag Ab)      A non  reactive test result means that HIV 1 or HIV 2 antibodies and HIV 1 p24 antigen were not detected in the specimen.    Comment: Performed at [facility], [place] [address] [place], [place] [address]  ABO/Rh     Status: None   Collection Time: 05/11/19 10:36 AM  Result Value Ref Range   ABO/RH(D)      A POS Performed at [facility] [address] [place], [place] [address]   Prepare RBC     Status: None   Collection Time: 05/11/19 11:12 AM  Result Value Ref Range   Order Confirmation      ORDER PROCESSED BY BLOOD BANK Performed at [facility], [place] [address] [place], [place] [address]   Type and screen [place]     Status: None (Preliminary result)   Collection Time: 05/11/19 11:25 AM  Result Value Ref Range   ABO/RH(D) A POS    Antibody Screen NEG    Sample Expiration 05/14/2019,2359    Unit Number [id number]    Blood Component Type RED CELLS,LR    Unit division 00    Status of Unit ISSUED    Transfusion Status OK TO TRANSFUSE    Crossmatch Result      Compatible Performed at [facility], [place] [address], [place], [place] [address]    Unit Number [id number]    Blood Component Type RED CELLS,LR    Unit division 00    Status of Unit ISSUED    Transfusion Status OK TO TRANSFUSE    Crossmatch Result Compatible    CT Angio Chest PE W and/or Wo Contrast  Result Date: 05/11/2019 CLINICAL DATA:  Dyspnea.  Upper back pain for 1 week. EXAM: CT ANGIOGRAPHY CHEST, ABDOMEN AND PELVIS TECHNIQUE: Multidetector CT imaging through the chest, abdomen and pelvis was performed using the standard protocol during bolus administration of intravenous contrast. Multiplanar reconstructed images and MIPs were obtained and reviewed to evaluate the vascular anatomy. CONTRAST:  151m OMNIPAQUE IOHEXOL 350 MG/ML SOLN COMPARISON:  Chest radiograph from earlier today. 06/12/2017 screening chest CT. 02/28/2013 CT abdomen/pelvis. FINDINGS: CTA CHEST FINDINGS Cardiovascular: Normal heart size. No significant pericardial effusion/thickening. Three-vessel coronary atherosclerosis. Atherosclerotic thoracic aorta. There is a thick-walled very irregularly contoured aneurysm involving an approximately 5 cm in length segment of proximal to mid descending thoracic aorta, measuring 5.0 cm in maximum AP diameter (series 21/image 70) and 5.5 cm in transverse diameter (series 11/image 58), new since 03/20 5019 unenhanced chest CT study. The thoracic aortic aneurysm wall is hyperdense, suggesting acute hemorrhage into the aneurysm wall. No discrete aortic dissection flap. Normal caliber pulmonary arteries. No pulmonary  emboli. Mediastinum/Nodes: No discrete thyroid nodules. Unremarkable esophagus. No pathologically enlarged axillary, mediastinal or hilar lymph nodes. Lungs/Pleura: No pneumothorax. No pleural effusion. No acute consolidative airspace disease, lung masses or significant pulmonary nodules. Mild paraseptal emphysema. Musculoskeletal: No aggressive appearing focal osseous lesions. Healing subacute nondisplaced lower sternal fracture. Mild thoracic spondylosis. Review of the MIP images confirms the above findings. CTA ABDOMEN AND PELVIS FINDINGS VASCULAR Aorta: Atherosclerotic abdominal aorta with ectatic 2.5 cm infrarenal abdominal aorta, increased from 2.0 cm on 02/28/2013. No abdominal aortic dissection. Celiac: Patent without evidence of aneurysm, dissection, vasculitis or significant stenosis. SMA: Patent without evidence of aneurysm, dissection, vasculitis or significant stenosis. Renals: Both renal arteries are patent without evidence of aneurysm, dissection, vasculitis, fibromuscular dysplasia or significant stenosis. IMA: Patent without evidence of aneurysm, dissection, vasculitis or  significant stenosis. Inflow: Moderate stenosis of the left common iliac artery near the bifurcation (series 11/image 190). Otherwise no significant stenoses. Veins: No obvious venous abnormality within the limitations of this arterial phase study. Review of the MIP images confirms the above findings. NON-VASCULAR Hepatobiliary: Normal liver with no liver mass. Cholecystectomy. Bile ducts are stable and within normal post cholecystectomy limits with CBD diameter 6 mm. Pancreas: Normal, with no mass or duct dilation. Spleen: Normal size. No mass. Adrenals/Urinary Tract: Normal adrenals. No hydronephrosis. No contour deforming renal masses. Normal bladder. Stomach/Bowel: Normal non-distended stomach. Normal caliber small bowel with no small bowel wall thickening. Candidate normal appendix. Moderate sigmoid diverticulosis. No large  bowel wall thickening or acute pericolonic fat stranding. Vascular/Lymphatic: No pathologically enlarged lymph nodes in the abdomen or pelvis. Reproductive: Status post hysterectomy, with no abnormal findings at the vaginal cuff. No adnexal mass. Other: No pneumoperitoneum, ascites or focal fluid collection. Small fat containing periumbilical hernia. Musculoskeletal: No aggressive appearing focal osseous lesions. Review of the MIP images confirms the above findings. IMPRESSION: 1. Thick-walled thoracic aortic aneurysm with very irregular contour in the proximal to the mid descending thoracic aorta measuring 5.5 cm transverse by 5.0 cm AP x 5.0 cm length, new since 06/12/2017 chest CT. High density within the aneurysm wall suggests acute hemorrhage into the wall/unstable aneurysm. Emergent thoracic surgical consultation advised. Differential includes mycotic aneurysm versus unstable penetrating atherosclerotic ulcer. 2. Three-vessel coronary atherosclerosis. 3. Infrarenal 2.5 cm ectatic abdominal aorta, at risk for aneurysm development. Recommend follow-up aortic ultrasound in 5 years. This recommendation follows ACR consensus guidelines: White Paper of the ACR Incidental Findings Committee II on Vascular Findings. J Am Coll Radiol 2013; 24:235-361. 4.  Aortic Atherosclerosis (ICD10-I70.0). 5. Nondisplaced healing subacute lower sternal fracture. Correlate for injury history. 6. Moderate sigmoid diverticulosis. Critical Value/emergent results were called by telephone at the time of interpretation on 05/11/2019 at 8:56 am to provider [facility] , who verbally acknowledged these results. Electronically Signed   By: Ilona Sorrel M.D.   On: 05/11/2019 09:07   DG Chest Port 1 View  Result Date: 05/11/2019 CLINICAL DATA:  Fever EXAM: PORTABLE CHEST 1 VIEW COMPARISON:  01/05/2019 chest radiograph. FINDINGS: Stable cardiomediastinal silhouette with normal heart size. No pneumothorax. No pleural effusion. Lungs appear  clear, with no acute consolidative airspace disease and no pulmonary edema. IMPRESSION: No active disease. Electronically Signed   By: Ilona Sorrel M.D.   On: 05/11/2019 04:44   CT Angio Chest/Abd/Pel for Dissection W and/or W/WO  Result Date: 05/11/2019 CLINICAL DATA:  Dyspnea.  Upper back pain for 1 week. EXAM: CT ANGIOGRAPHY CHEST, ABDOMEN AND PELVIS TECHNIQUE: Multidetector CT imaging through the chest, abdomen and pelvis was performed using the standard protocol during bolus administration of intravenous contrast. Multiplanar reconstructed images and MIPs were obtained and reviewed to evaluate the vascular anatomy. CONTRAST:  124m OMNIPAQUE IOHEXOL 350 MG/ML SOLN COMPARISON:  Chest radiograph from earlier today. 06/12/2017 screening chest CT. 02/28/2013 CT abdomen/pelvis. FINDINGS: CTA CHEST FINDINGS Cardiovascular: Normal heart size. No significant pericardial effusion/thickening. Three-vessel coronary atherosclerosis. Atherosclerotic thoracic aorta. There is a thick-walled very irregularly contoured aneurysm involving an approximately 5 cm in length segment of proximal to mid descending thoracic aorta, measuring 5.0 cm in maximum AP diameter (series 21/image 70) and 5.5 cm in transverse diameter (series 11/image 58), new since 03/20 5019 unenhanced chest CT study. The thoracic aortic aneurysm wall is hyperdense, suggesting acute hemorrhage into the aneurysm wall. No discrete aortic dissection flap. Normal caliber pulmonary  arteries. No pulmonary emboli. Mediastinum/Nodes: No discrete thyroid nodules. Unremarkable esophagus. No pathologically enlarged axillary, mediastinal or hilar lymph nodes. Lungs/Pleura: No pneumothorax. No pleural effusion. No acute consolidative airspace disease, lung masses or significant pulmonary nodules. Mild paraseptal emphysema. Musculoskeletal: No aggressive appearing focal osseous lesions. Healing subacute nondisplaced lower sternal fracture. Mild thoracic spondylosis.  Review of the MIP images confirms the above findings. CTA ABDOMEN AND PELVIS FINDINGS VASCULAR Aorta: Atherosclerotic abdominal aorta with ectatic 2.5 cm infrarenal abdominal aorta, increased from 2.0 cm on 02/28/2013. No abdominal aortic dissection. Celiac: Patent without evidence of aneurysm, dissection, vasculitis or significant stenosis. SMA: Patent without evidence of aneurysm, dissection, vasculitis or significant stenosis. Renals: Both renal arteries are patent without evidence of aneurysm, dissection, vasculitis, fibromuscular dysplasia or significant stenosis. IMA: Patent without evidence of aneurysm, dissection, vasculitis or significant stenosis. Inflow: Moderate stenosis of the left common iliac artery near the bifurcation (series 11/image 190). Otherwise no significant stenoses. Veins: No obvious venous abnormality within the limitations of this arterial phase study. Review of the MIP images confirms the above findings. NON-VASCULAR Hepatobiliary: Normal liver with no liver mass. Cholecystectomy. Bile ducts are stable and within normal post cholecystectomy limits with CBD diameter 6 mm. Pancreas: Normal, with no mass or duct dilation. Spleen: Normal size. No mass. Adrenals/Urinary Tract: Normal adrenals. No hydronephrosis. No contour deforming renal masses. Normal bladder. Stomach/Bowel: Normal non-distended stomach. Normal caliber small bowel with no small bowel wall thickening. Candidate normal appendix. Moderate sigmoid diverticulosis. No large bowel wall thickening or acute pericolonic fat stranding. Vascular/Lymphatic: No pathologically enlarged lymph nodes in the abdomen or pelvis. Reproductive: Status post hysterectomy, with no abnormal findings at the vaginal cuff. No adnexal mass. Other: No pneumoperitoneum, ascites or focal fluid collection. Small fat containing periumbilical hernia. Musculoskeletal: No aggressive appearing focal osseous lesions. Review of the MIP images confirms the above  findings. IMPRESSION: 1. Thick-walled thoracic aortic aneurysm with very irregular contour in the proximal to the mid descending thoracic aorta measuring 5.5 cm transverse by 5.0 cm AP x 5.0 cm length, new since 06/12/2017 chest CT. High density within the aneurysm wall suggests acute hemorrhage into the wall/unstable aneurysm. Emergent thoracic surgical consultation advised. Differential includes mycotic aneurysm versus unstable penetrating atherosclerotic ulcer. 2. Three-vessel coronary atherosclerosis. 3. Infrarenal 2.5 cm ectatic abdominal aorta, at risk for aneurysm development. Recommend follow-up aortic ultrasound in 5 years. This recommendation follows ACR consensus guidelines: White Paper of the ACR Incidental Findings Committee II on Vascular Findings. J Am Coll Radiol 2013; 78:588-502. 4.  Aortic Atherosclerosis (ICD10-I70.0). 5. Nondisplaced healing subacute lower sternal fracture. Correlate for injury history. 6. Moderate sigmoid diverticulosis. Critical Value/emergent results were called by telephone at the time of interpretation on 05/11/2019 at 8:56 am to provider [facility] , who verbally acknowledged these results. Electronically Signed   By: Ilona Sorrel M.D.   On: 05/11/2019 09:07   HYBRID OR IMAGING ([facility] ONLY)  Result Date: 05/11/2019 There is no interpretation for this exam.  This order is for images obtained during a surgical procedure.  Please See "Surgeries" Tab for more information regarding the procedure.    Review Of Systems Constitutional: No fever, chills, Chronic weight loss. Eyes: No vision change, wears glasses. No discharge or pain. Ears: No hearing loss, No tinnitus. Respiratory: No asthma, Positive COPD, pneumonias, shortness of breath. No hemoptysis. Cardiovascular: Positive chest pain, palpitation, no leg edema. Gastrointestinal: Positive nausea, vomiting, diarrhea, constipation. No GI bleed. No hepatitis. Genitourinary: No dysuria, hematuria, kidney stone. No  incontinance. Neurological:  Positive headache, stroke, no seizures.  Psychiatry: No psych facility admission for anxiety, depression, suicide. No detox. Skin: No rash. Musculoskeletal: Positive joint pain, fibromyalgia, neck pain, back pain. Lymphadenopathy: No lymphadenopathy. Hematology: No anemia or easy bruising.   Blood pressure 114/67, pulse 86, temperature (!) 100.4 F (38 C), temperature source Oral, resp. rate (!) 22, SpO2 98 %. There is no height or weight on file to calculate BMI. General appearance: alert, cooperative, appears stated age and mild respiratory distress Head: Normocephalic, atraumatic. Eyes: Blue eyes, pink conjunctiva, corneas clear. PERRL, EOM's intact. Neck: No adenopathy, no carotid bruit, no JVD, supple, symmetrical, trachea midline and thyroid not enlarged. Resp: Clear to auscultation bilaterally. Cardio: Regular rate and rhythm, S1, S2 normal, II/VI systolic murmur, no click, rub or gallop GI: Soft, mild epigastric tenderness; bowel sounds normal; no organomegaly. Extremities: No edema, cyanosis or clubbing. Back: Mild tenderness upper and mid back area. Skin: Warm and dry.  Neurologic: Alert and oriented X 3, normal strength. Normal coordination.  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 Hypertension Chronic anxiety Anemia Moderate protein calorie malnutrition Mild hyperglycemia  Follow with vascular surgery.  Time spent: Review of old records, Lab, x-rays, EKG, other cardiac tests, examination, discussion with patient, nurse and consultant and referring doctor over 70 minutes.  Birdie Riddle, MD  05/11/2019, 1:20 PM

## 2019-05-11 NOTE — Anesthesia Procedure Notes (Signed)
Arterial Line Insertion Start/End2/20/2021 12:25 PM, 05/11/2019 12:35 PM Performed by: Elliot Dally, CRNA, CRNA  Patient location: Pre-op. Preanesthetic checklist: patient identified, IV checked, site marked, risks and benefits discussed, surgical consent, monitors and equipment checked, pre-op evaluation, timeout performed and anesthesia consent Patient sedated Left, radial was placed Catheter size: 20 G Hand hygiene performed  and maximum sterile barriers used  Allen's test indicative of satisfactory collateral circulation Attempts: 1 Procedure performed using ultrasound guided technique. Ultrasound Notes:anatomy identified, needle tip was noted to be adjacent to the nerve/plexus identified and no ultrasound evidence of intravascular and/or intraneural injection Following insertion, dressing applied and Biopatch. Post procedure assessment: normal  Patient tolerated the procedure well with no immediate complications.

## 2019-05-11 NOTE — Progress Notes (Signed)
Pharmacy Consult - Esmolol  63 yof with thoracic aortic aneurysm. Pharmacy consulted to start esmolol to reduce heart rate below 60 beats/minute. Target SBP 100-120.   Plan: Esmolol 250 mcg/kg IV loading dose over 1 minute (lower end due to low weight and BP on the lower end); then start infusion at 25 mcg/kg/minute; maximum 300 mcg/kg/min   Babs Bertin, PharmD, BCPS Please check AMION for all [facility] contact numbers Clinical Pharmacist 05/11/2019 8:57 AM

## 2019-05-11 NOTE — Consult Note (Signed)
Reason for Consult:Descending aneurysm Referring Physician: ED  Brittany [name] is an 64 y.o. female.   HPI: 64 yo woman with with a past history of multiple strokes, dementia, tobacco abuse, COPD, hypertension, anxiety and depression.  She developed back pain about a week ago.  It progressively worsened.  She saw her primary care and was given some muscle relaxants but with no relief.  The pain finally became severe enough that she came to the emergency room for further evaluation.  A CT of the chest showed a descending aortic aneurysm with evidence of acute hemorrhage into the aneurysm wall. She denies fevers, chills or other prodromal symptoms other than back pain. Currently she says the pain has eased off.  Past Medical History:  Diagnosis Date  . Alzheimer disease ([place])   . Anxiety   . Bilateral pneumonia 01/2017   per new patient packet   . COPD (chronic obstructive pulmonary disease) ([place]) 01/2017   per new patient packet   . Depression   . Fatty liver   . Gallbladder disease 2014   per new patient packet   . Hernia, abdominal   . Hypertension   . Liver damage 01/2017   per new patient packet, Dr.Kadakia  . Normal cardiac stress test 2007  . Ovarian cyst 1977   8 1/2 lb left ovarian cyst, Dr.Cox, per new patient packet   . Pneumonia   . Right ovarian cyst    1992, 1993, 1994, 1995, and 1996 Dr.Neil, per new patient packet   . Stroke ([place])    4 strokes.     Past Surgical History:  Procedure Laterality Date  . ABDOMINAL HYSTERECTOMY Bilateral    Total  . CESAREAN SECTION    . CHOLECYSTECTOMY    . INGUINAL HERNIA REPAIR Right   . LEFT OOPHORECTOMY  1977   with cyst removal, age 65  . MINOR EXCISION OF ORAL LESION N/A 10/07/2017   Procedure: IRRIGATION AND DEBRIDEMENT OF ORAL INFECTION, REMOVAL OF REMAINING 8 TEETH AND PLACEMENT OF PENROSE DRAINS;  Surgeon: Michael Litter, DMD;  Location: [facility];  Service: Oral Surgery;  Laterality: N/A;  . OVARIAN CYST REMOVAL Right 93,  10,17, 96  . UMBILICAL HERNIA REPAIR      Family History  Problem Relation Age of Onset  . Stroke Mother   . COPD Mother   . Dementia Mother   . Breast cancer Mother   . Diabetes Father   . Heart attack Father   . Diabetes Sister   . Stroke Sister        x2  . Hepatitis C Daughter   . Diabetes Paternal Grandmother     Social History:  reports that she has been smoking cigarettes. She has a 23.00 pack-year smoking history. She has never used smokeless tobacco. She reports current alcohol use of about 1.0 standard drinks of alcohol per week. She reports previous drug use.  Allergies:  Allergies  Allergen Reactions  . Amoxicillin Hives and Rash     Has patient had a PCN reaction causing immediate rash, facial/tongue/throat swelling, SOB or lightheadedness with hypotension: Yes, hives Has patient had a PCN reaction causing severe rash involving mucus membranes or skin necrosis: No Has patient had a PCN reaction that required hospitalization No Has patient had a PCN reaction occurring within the last 10 years: Yes If all of the above answers are "NO", then may proceed with Cephalosporin use.   . Clindamycin/Lincomycin Diarrhea and Other (See Comments)    Patient states  that clindamycin causes diarrhea   . Ropinirole Other (See Comments)    "Made me insane" (per the patient)  . Wellbutrin [Bupropion] Diarrhea and Other (See Comments)    Lightheadedness, also  . Sulfamethoxazole Nausea And Vomiting and Rash  . Sulfonamide Derivatives Nausea And Vomiting and Rash    Medications: Prior to Admission: (Not in a hospital admission)   Results for orders placed or performed during the hospital encounter of 05/10/19 (from the past 48 hour(s))  CBC with Differential/Platelet     Status: Abnormal   Collection Time: 05/11/19  4:31 AM  Result Value Ref Range   WBC 10.4 4.0 - 10.5 K/uL   RBC 3.86 (L) 3.87 - 5.11 MIL/uL   Hemoglobin 9.5 (L) 12.0 - 15.0 g/dL   HCT 31.2 (L) 36.0 - 46.0 %    MCV 80.8 80.0 - 100.0 fL   MCH 24.6 (L) 26.0 - 34.0 pg   MCHC 30.4 30.0 - 36.0 g/dL   RDW 15.0 11.5 - 15.5 %   Platelets 457 (H) 150 - 400 K/uL   nRBC 0.0 0.0 - 0.2 %   Neutrophils Relative % 68 %   Neutro Abs 7.1 1.7 - 7.7 K/uL   Lymphocytes Relative 22 %   Lymphs Abs 2.3 0.7 - 4.0 K/uL   Monocytes Relative 7 %   Monocytes Absolute 0.8 0.1 - 1.0 K/uL   Eosinophils Relative 2 %   Eosinophils Absolute 0.2 0.0 - 0.5 K/uL   Basophils Relative 1 %   Basophils Absolute 0.1 0.0 - 0.1 K/uL   Immature Granulocytes 0 %   Abs Immature Granulocytes 0.04 0.00 - 0.07 K/uL    Comment: Performed at [facility], [address] [place], [place] [address]  Basic metabolic panel     Status: Abnormal   Collection Time: 05/11/19  4:31 AM  Result Value Ref Range   Sodium 139 135 - 145 mmol/L   Potassium 4.0 3.5 - 5.1 mmol/L   Chloride 101 98 - 111 mmol/L   CO2 27 22 - 32 mmol/L   Glucose, Bld 125 (H) 70 - 99 mg/dL   BUN 7 (L) 8 - 23 mg/dL   Creatinine, Ser 0.67 0.44 - 1.00 mg/dL   Calcium 9.0 8.9 - 10.3 mg/dL   GFR calc non Af Amer >60 >60 mL/min   GFR calc Af Amer >60 >60 mL/min   Anion gap 11 5 - 15    Comment: Performed at [facility] [address], [place], [place] [address]  D-dimer, quantitative (not at [facility])     Status: Abnormal   Collection Time: 05/11/19  4:31 AM  Result Value Ref Range   D-Dimer, Quant 1.61 (H) 0.00 - 0.50 ug/mL-FEU    Comment: (NOTE) At the manufacturer cut-off of 0.50 ug/mL FEU, this assay has been documented to exclude PE with a sensitivity and negative predictive value of 97 to 99%.  At this time, this assay has not been approved by the FDA to exclude DVT/VTE. Results should be correlated with clinical presentation. Performed at [facility], [place] [address] [place], [place] [address]   Troponin I (High Sensitivity)     Status: None   Collection Time: 05/11/19  4:31 AM  Result Value Ref Range   Troponin I (High Sensitivity) 9 <18  ng/L    Comment: (NOTE) Elevated high sensitivity troponin I (hsTnI) values and significant  changes across serial measurements may suggest ACS but many other  chronic  and acute conditions are known to elevate hsTnI results.  Refer to the "Links" section for chest pain algorithms and additional  guidance. Performed at [facility], [place] [address] [place], [place] [address]   POC SARS Coronavirus 2 Ag-ED - Nasal Swab (BD Veritor Kit)     Status: None   Collection Time: 05/11/19  6:03 AM  Result Value Ref Range   SARS Coronavirus 2 Ag NEGATIVE NEGATIVE    Comment: (NOTE) SARS-CoV-2 antigen NOT DETECTED.  Negative results are presumptive.  Negative results do not preclude SARS-CoV-2 infection and should not be used as the sole basis for treatment or other patient management decisions, including infection  control decisions, particularly in the presence of clinical signs and  symptoms consistent with COVID-19, or in those who have been in contact with the virus.  Negative results must be combined with clinical observations, patient history, and epidemiological information. The expected result is Negative. Fact Sheet for Patients: PodPark.tn Fact Sheet for Healthcare Providers: GiftContent.is This test is not yet approved or cleared by the [place] FDA and  has been authorized for detection and/or diagnosis of SARS-CoV-2 by FDA under an Emergency Use Authorization (EUA).  This EUA will remain in effect (meaning this test can be used) for the duration of  the COVID-19 de claration under Section 564(b)(1) of the Act, 21 U.S.C. section 360bbb-3(b)(1), unless the authorization is terminated or revoked sooner.   Troponin I (High Sensitivity)     Status: None   Collection Time: 05/11/19  6:31 AM  Result Value Ref Range   Troponin I (High Sensitivity) 4 <18 ng/L    Comment: (NOTE) Elevated high sensitivity troponin I  (hsTnI) values and significant  changes across serial measurements may suggest ACS but many other  chronic and acute conditions are known to elevate hsTnI results.  Refer to the "Links" section for chest pain algorithms and additional  guidance. Performed at [facility], [place] [address], [place], [place] [address]     CT Angio Chest PE W and/or Wo Contrast  Result Date: 05/11/2019 CLINICAL DATA:  Dyspnea.  Upper back pain for 1 week. EXAM: CT ANGIOGRAPHY CHEST, ABDOMEN AND PELVIS TECHNIQUE: Multidetector CT imaging through the chest, abdomen and pelvis was performed using the standard protocol during bolus administration of intravenous contrast. Multiplanar reconstructed images and MIPs were obtained and reviewed to evaluate the vascular anatomy. CONTRAST:  153m OMNIPAQUE IOHEXOL 350 MG/ML SOLN COMPARISON:  Chest radiograph from earlier today. 06/12/2017 screening chest CT. 02/28/2013 CT abdomen/pelvis. FINDINGS: CTA CHEST FINDINGS Cardiovascular: Normal heart size. No significant pericardial effusion/thickening. Three-vessel coronary atherosclerosis. Atherosclerotic thoracic aorta. There is a thick-walled very irregularly contoured aneurysm involving an approximately 5 cm in length segment of proximal to mid descending thoracic aorta, measuring 5.0 cm in maximum AP diameter (series 21/image 70) and 5.5 cm in transverse diameter (series 11/image 58), new since 03/20 5019 unenhanced chest CT study. The thoracic aortic aneurysm wall is hyperdense, suggesting acute hemorrhage into the aneurysm wall. No discrete aortic dissection flap. Normal caliber pulmonary arteries. No pulmonary emboli. Mediastinum/Nodes: No discrete thyroid nodules. Unremarkable esophagus. No pathologically enlarged axillary, mediastinal or hilar lymph nodes. Lungs/Pleura: No pneumothorax. No pleural effusion. No acute consolidative airspace disease, lung masses or significant pulmonary nodules. Mild paraseptal emphysema.  Musculoskeletal: No aggressive appearing focal osseous lesions. Healing subacute nondisplaced lower sternal fracture. Mild thoracic spondylosis. Review of the MIP images confirms the above findings. CTA ABDOMEN AND PELVIS FINDINGS VASCULAR Aorta: Atherosclerotic abdominal  aorta with ectatic 2.5 cm infrarenal abdominal aorta, increased from 2.0 cm on 02/28/2013. No abdominal aortic dissection. Celiac: Patent without evidence of aneurysm, dissection, vasculitis or significant stenosis. SMA: Patent without evidence of aneurysm, dissection, vasculitis or significant stenosis. Renals: Both renal arteries are patent without evidence of aneurysm, dissection, vasculitis, fibromuscular dysplasia or significant stenosis. IMA: Patent without evidence of aneurysm, dissection, vasculitis or significant stenosis. Inflow: Moderate stenosis of the left common iliac artery near the bifurcation (series 11/image 190). Otherwise no significant stenoses. Veins: No obvious venous abnormality within the limitations of this arterial phase study. Review of the MIP images confirms the above findings. NON-VASCULAR Hepatobiliary: Normal liver with no liver mass. Cholecystectomy. Bile ducts are stable and within normal post cholecystectomy limits with CBD diameter 6 mm. Pancreas: Normal, with no mass or duct dilation. Spleen: Normal size. No mass. Adrenals/Urinary Tract: Normal adrenals. No hydronephrosis. No contour deforming renal masses. Normal bladder. Stomach/Bowel: Normal non-distended stomach. Normal caliber small bowel with no small bowel wall thickening. Candidate normal appendix. Moderate sigmoid diverticulosis. No large bowel wall thickening or acute pericolonic fat stranding. Vascular/Lymphatic: No pathologically enlarged lymph nodes in the abdomen or pelvis. Reproductive: Status post hysterectomy, with no abnormal findings at the vaginal cuff. No adnexal mass. Other: No pneumoperitoneum, ascites or focal fluid collection. Small fat  containing periumbilical hernia. Musculoskeletal: No aggressive appearing focal osseous lesions. Review of the MIP images confirms the above findings. IMPRESSION: 1. Thick-walled thoracic aortic aneurysm with very irregular contour in the proximal to the mid descending thoracic aorta measuring 5.5 cm transverse by 5.0 cm AP x 5.0 cm length, new since 06/12/2017 chest CT. High density within the aneurysm wall suggests acute hemorrhage into the wall/unstable aneurysm. Emergent thoracic surgical consultation advised. Differential includes mycotic aneurysm versus unstable penetrating atherosclerotic ulcer. 2. Three-vessel coronary atherosclerosis. 3. Infrarenal 2.5 cm ectatic abdominal aorta, at risk for aneurysm development. Recommend follow-up aortic ultrasound in 5 years. This recommendation follows ACR consensus guidelines: White Paper of the ACR Incidental Findings Committee II on Vascular Findings. J Am Coll Radiol 2013; 09:381-829. 4.  Aortic Atherosclerosis (ICD10-I70.0). 5. Nondisplaced healing subacute lower sternal fracture. Correlate for injury history. 6. Moderate sigmoid diverticulosis. Critical Value/emergent results were called by telephone at the time of interpretation on 05/11/2019 at 8:56 am to provider [facility] , who verbally acknowledged these results. Electronically Signed   By: Ilona Sorrel M.D.   On: 05/11/2019 09:07   DG Chest Port 1 View  Result Date: 05/11/2019 CLINICAL DATA:  Fever EXAM: PORTABLE CHEST 1 VIEW COMPARISON:  01/05/2019 chest radiograph. FINDINGS: Stable cardiomediastinal silhouette with normal heart size. No pneumothorax. No pleural effusion. Lungs appear clear, with no acute consolidative airspace disease and no pulmonary edema. IMPRESSION: No active disease. Electronically Signed   By: Ilona Sorrel M.D.   On: 05/11/2019 04:44   CT Angio Chest/Abd/Pel for Dissection W and/or W/WO  Result Date: 05/11/2019 CLINICAL DATA:  Dyspnea.  Upper back pain for 1 week. EXAM:  CT ANGIOGRAPHY CHEST, ABDOMEN AND PELVIS TECHNIQUE: Multidetector CT imaging through the chest, abdomen and pelvis was performed using the standard protocol during bolus administration of intravenous contrast. Multiplanar reconstructed images and MIPs were obtained and reviewed to evaluate the vascular anatomy. CONTRAST:  1106m OMNIPAQUE IOHEXOL 350 MG/ML SOLN COMPARISON:  Chest radiograph from earlier today. 06/12/2017 screening chest CT. 02/28/2013 CT abdomen/pelvis. FINDINGS: CTA CHEST FINDINGS Cardiovascular: Normal heart size. No significant pericardial effusion/thickening. Three-vessel coronary atherosclerosis. Atherosclerotic thoracic aorta. There is a thick-walled very irregularly  contoured aneurysm involving an approximately 5 cm in length segment of proximal to mid descending thoracic aorta, measuring 5.0 cm in maximum AP diameter (series 21/image 70) and 5.5 cm in transverse diameter (series 11/image 58), new since 03/20 5019 unenhanced chest CT study. The thoracic aortic aneurysm wall is hyperdense, suggesting acute hemorrhage into the aneurysm wall. No discrete aortic dissection flap. Normal caliber pulmonary arteries. No pulmonary emboli. Mediastinum/Nodes: No discrete thyroid nodules. Unremarkable esophagus. No pathologically enlarged axillary, mediastinal or hilar lymph nodes. Lungs/Pleura: No pneumothorax. No pleural effusion. No acute consolidative airspace disease, lung masses or significant pulmonary nodules. Mild paraseptal emphysema. Musculoskeletal: No aggressive appearing focal osseous lesions. Healing subacute nondisplaced lower sternal fracture. Mild thoracic spondylosis. Review of the MIP images confirms the above findings. CTA ABDOMEN AND PELVIS FINDINGS VASCULAR Aorta: Atherosclerotic abdominal aorta with ectatic 2.5 cm infrarenal abdominal aorta, increased from 2.0 cm on 02/28/2013. No abdominal aortic dissection. Celiac: Patent without evidence of aneurysm, dissection, vasculitis or  significant stenosis. SMA: Patent without evidence of aneurysm, dissection, vasculitis or significant stenosis. Renals: Both renal arteries are patent without evidence of aneurysm, dissection, vasculitis, fibromuscular dysplasia or significant stenosis. IMA: Patent without evidence of aneurysm, dissection, vasculitis or significant stenosis. Inflow: Moderate stenosis of the left common iliac artery near the bifurcation (series 11/image 190). Otherwise no significant stenoses. Veins: No obvious venous abnormality within the limitations of this arterial phase study. Review of the MIP images confirms the above findings. NON-VASCULAR Hepatobiliary: Normal liver with no liver mass. Cholecystectomy. Bile ducts are stable and within normal post cholecystectomy limits with CBD diameter 6 mm. Pancreas: Normal, with no mass or duct dilation. Spleen: Normal size. No mass. Adrenals/Urinary Tract: Normal adrenals. No hydronephrosis. No contour deforming renal masses. Normal bladder. Stomach/Bowel: Normal non-distended stomach. Normal caliber small bowel with no small bowel wall thickening. Candidate normal appendix. Moderate sigmoid diverticulosis. No large bowel wall thickening or acute pericolonic fat stranding. Vascular/Lymphatic: No pathologically enlarged lymph nodes in the abdomen or pelvis. Reproductive: Status post hysterectomy, with no abnormal findings at the vaginal cuff. No adnexal mass. Other: No pneumoperitoneum, ascites or focal fluid collection. Small fat containing periumbilical hernia. Musculoskeletal: No aggressive appearing focal osseous lesions. Review of the MIP images confirms the above findings. IMPRESSION: 1. Thick-walled thoracic aortic aneurysm with very irregular contour in the proximal to the mid descending thoracic aorta measuring 5.5 cm transverse by 5.0 cm AP x 5.0 cm length, new since 06/12/2017 chest CT. High density within the aneurysm wall suggests acute hemorrhage into the wall/unstable  aneurysm. Emergent thoracic surgical consultation advised. Differential includes mycotic aneurysm versus unstable penetrating atherosclerotic ulcer. 2. Three-vessel coronary atherosclerosis. 3. Infrarenal 2.5 cm ectatic abdominal aorta, at risk for aneurysm development. Recommend follow-up aortic ultrasound in 5 years. This recommendation follows ACR consensus guidelines: White Paper of the ACR Incidental Findings Committee II on Vascular Findings. J Am Coll Radiol 2013; 99:833-825. 4.  Aortic Atherosclerosis (ICD10-I70.0). 5. Nondisplaced healing subacute lower sternal fracture. Correlate for injury history. 6. Moderate sigmoid diverticulosis. Critical Value/emergent results were called by telephone at the time of interpretation on 05/11/2019 at 8:56 am to provider [facility] , who verbally acknowledged these results. Electronically Signed   By: Ilona Sorrel M.D.   On: 05/11/2019 09:07   I personally reviewed the CT images and concur with the findings noted above Review of Systems  Constitutional: Negative for chills and fever.  Cardiovascular: Positive for chest pain. Negative for leg swelling.  Musculoskeletal: Positive for back pain.  Blood pressure 122/82, pulse 84, temperature (!) 100.4 F (38 C), temperature source Oral, resp. rate 18, SpO2 98 %. Physical Exam  Vitals reviewed. Constitutional: She is oriented to person, place, and time. She appears well-developed and well-nourished. No distress.  HENT:  Head: Normocephalic and atraumatic.  Eyes: EOM are normal.  Neck: No thyromegaly present.  Cardiovascular: Normal rate, regular rhythm, normal heart sounds and intact distal pulses.  No murmur heard. Respiratory: Effort normal and breath sounds normal. No respiratory distress. She has no wheezes. She has no rales.  GI: Soft. She exhibits no distension. There is no abdominal tenderness.  Musculoskeletal:        General: No edema.     Cervical back: Neck supple.  Neurological: She is  alert and oriented to person, place, and time. No cranial nerve deficit. Coordination normal.  Skin: Skin is warm and dry.    Assessment/Plan: 64 yo woman with history of hypertension, multiple strokes, dementia, anxiety and depression, but no prior history of aneurysm disease.  She presents with a 1 week history of back pain which is progressively worsened.  On CT she is found to have a 5.5 cm aneurysm in the mid descending thoracic aorta with evidence of hemorrhage into the wall.  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  She does not have any symptoms to suggest infection or sepsis although she does have a low-grade fever.  This is a life-threatening problem that needs intervention within the next 24 to 48 hours.  I discussed the case with Dr. Deitra Mayo of vascular surgery.  I think a TEVAR is probably her best option.  He will assess her for that.  In the meantime blood pressure control and pain control are paramount.  Currently both are well controlled.  Melrose Nakayama 05/11/2019, 9:46 AM

## 2019-05-11 NOTE — Consult Note (Signed)
Referring Physician:   [name] is an 64 y.o. female.                       Chief Complaint: Back pain  HPI: 64 years old female with PMH of hypertension, stroke, depression, COPD and Alzheimer disease has back pain x 1 week. She tried muscle relaxant given by her doctor without relief hence she came to ED for evaluation. Her EKG shows Sinus tachycardia with rare APC. Chest x-ray was unremarkable. For elevated D-dimer she had CT angio-chest which revealed 5.5 x 5.00 CM proximal to mid descending aortic aneurysm with contained rupture. She has chronic numbness in her feet and has h/o of osteoporosis with recurrent fractures in extremities with little trauma. She is on esmolol drip with good BP control. She is referred to vascular surgeon for possible stent placement.  Past Medical History:  Diagnosis Date  . Alzheimer disease ([place])   . Anxiety   . Bilateral pneumonia 01/2017   per new patient packet   . COPD (chronic obstructive pulmonary disease) ([place]) 01/2017   per new patient packet   . Depression   . Fatty liver   . Gallbladder disease 2014   per new patient packet   . Hernia, abdominal   . Hypertension   . Liver damage 01/2017   per new patient packet, Dr.Arin Peral  . Normal cardiac stress test 2007  . Ovarian cyst 1977   8 1/2 lb left ovarian cyst, Dr.Cox, per new patient packet   . Pneumonia   . Right ovarian cyst    1992, 1993, 1994, 1995, and 1996 Dr.Neil, per new patient packet   . Stroke ([place])    4 strokes.       Past Surgical History:  Procedure Laterality Date  . ABDOMINAL HYSTERECTOMY Bilateral    Total  . CESAREAN SECTION    . CHOLECYSTECTOMY    . INGUINAL HERNIA REPAIR Right   . LEFT OOPHORECTOMY  1977   with cyst removal, age 11  . MINOR EXCISION OF ORAL LESION N/A 10/07/2017   Procedure: IRRIGATION AND DEBRIDEMENT OF ORAL INFECTION, REMOVAL OF REMAINING 8 TEETH AND PLACEMENT OF PENROSE DRAINS;  Surgeon: Michael Litter, DMD;  Location: [facility];  Service:  Oral Surgery;  Laterality: N/A;  . OVARIAN CYST REMOVAL Right 93, 09,38, 96  . UMBILICAL HERNIA REPAIR      Family History  Problem Relation Age of Onset  . Stroke Mother   . COPD Mother   . Dementia Mother   . Breast cancer Mother   . Diabetes Father   . Heart attack Father   . Diabetes Sister   . Stroke Sister        x2  . Hepatitis C Daughter   . Diabetes Paternal Grandmother    Social History:  reports that she has been smoking cigarettes. She has a 23.00 pack-year smoking history. She has never used smokeless tobacco. She reports current alcohol use of about 1.0 standard drinks of alcohol per week. She reports previous drug use.  Allergies:  Allergies  Allergen Reactions  . Amoxicillin Hives and Rash     Has patient had a PCN reaction causing immediate rash, facial/tongue/throat swelling, SOB or lightheadedness with hypotension: Yes, hives Has patient had a PCN reaction causing severe rash involving mucus membranes or skin necrosis: No Has patient had a PCN reaction that required hospitalization No Has patient had a PCN reaction occurring within the last 10 years: Yes If  all of the above answers are "NO", then may proceed with Cephalosporin use.   . Clindamycin/Lincomycin Diarrhea and Other (See Comments)    Patient states that clindamycin causes diarrhea   . Ropinirole Other (See Comments)    "Made me insane" (per the patient)  . Wellbutrin [Bupropion] Diarrhea and Other (See Comments)    Lightheadedness, also  . Sulfamethoxazole Nausea And Vomiting and Rash  . Sulfonamide Derivatives Nausea And Vomiting and Rash    (Not in a hospital admission)   Results for orders placed or performed during the hospital encounter of 05/10/19 (from the past 48 hour(s))  CBC with Differential/Platelet     Status: Abnormal   Collection Time: 05/11/19  4:31 AM  Result Value Ref Range   WBC 10.4 4.0 - 10.5 K/uL   RBC 3.86 (L) 3.87 - 5.11 MIL/uL   Hemoglobin 9.5 (L) 12.0 - 15.0  g/dL   HCT 31.2 (L) 36.0 - 46.0 %   MCV 80.8 80.0 - 100.0 fL   MCH 24.6 (L) 26.0 - 34.0 pg   MCHC 30.4 30.0 - 36.0 g/dL   RDW 15.0 11.5 - 15.5 %   Platelets 457 (H) 150 - 400 K/uL   nRBC 0.0 0.0 - 0.2 %   Neutrophils Relative % 68 %   Neutro Abs 7.1 1.7 - 7.7 K/uL   Lymphocytes Relative 22 %   Lymphs Abs 2.3 0.7 - 4.0 K/uL   Monocytes Relative 7 %   Monocytes Absolute 0.8 0.1 - 1.0 K/uL   Eosinophils Relative 2 %   Eosinophils Absolute 0.2 0.0 - 0.5 K/uL   Basophils Relative 1 %   Basophils Absolute 0.1 0.0 - 0.1 K/uL   Immature Granulocytes 0 %   Abs Immature Granulocytes 0.04 0.00 - 0.07 K/uL    Comment: Performed at [facility], [address], [place], [place] [address]  Basic metabolic panel     Status: Abnormal   Collection Time: 05/11/19  4:31 AM  Result Value Ref Range   Sodium 139 135 - 145 mmol/L   Potassium 4.0 3.5 - 5.1 mmol/L   Chloride 101 98 - 111 mmol/L   CO2 27 22 - 32 mmol/L   Glucose, Bld 125 (H) 70 - 99 mg/dL   BUN 7 (L) 8 - 23 mg/dL   Creatinine, Ser 0.67 0.44 - 1.00 mg/dL   Calcium 9.0 8.9 - 10.3 mg/dL   GFR calc non Af Amer >60 >60 mL/min   GFR calc Af Amer >60 >60 mL/min   Anion gap 11 5 - 15    Comment: Performed at [facility] [address] [place], [place] [address]  D-dimer, quantitative (not at [facility])     Status: Abnormal   Collection Time: 05/11/19  4:31 AM  Result Value Ref Range   D-Dimer, Quant 1.61 (H) 0.00 - 0.50 ug/mL-FEU    Comment: (NOTE) At the manufacturer cut-off of 0.50 ug/mL FEU, this assay has been documented to exclude PE with a sensitivity and negative predictive value of 97 to 99%.  At this time, this assay has not been approved by the FDA to exclude DVT/VTE. Results should be correlated with clinical presentation. Performed at [facility], [place] [address] [place], [place] [address]   Troponin I (High Sensitivity)     Status: None   Collection Time: 05/11/19  4:31 AM  Result Value Ref Range    Troponin I (High Sensitivity) 9 <18 ng/L    Comment: (  NOTE) Elevated high sensitivity troponin I (hsTnI) values and significant  changes across serial measurements may suggest ACS but many other  chronic and acute conditions are known to elevate hsTnI results.  Refer to the "Links" section for chest pain algorithms and additional  guidance. Performed at [facility], [place] [address] [place], [place] [address]   POC SARS Coronavirus 2 Ag-ED - Nasal Swab (BD Veritor Kit)     Status: None   Collection Time: 05/11/19  6:03 AM  Result Value Ref Range   SARS Coronavirus 2 Ag NEGATIVE NEGATIVE    Comment: (NOTE) SARS-CoV-2 antigen NOT DETECTED.  Negative results are presumptive.  Negative results do not preclude SARS-CoV-2 infection and should not be used as the sole basis for treatment or other patient management decisions, including infection  control decisions, particularly in the presence of clinical signs and  symptoms consistent with COVID-19, or in those who have been in contact with the virus.  Negative results must be combined with clinical observations, patient history, and epidemiological information. The expected result is Negative. Fact Sheet for Patients: PodPark.tn Fact Sheet for Healthcare Providers: GiftContent.is This test is not yet approved or cleared by the [place] FDA and  has been authorized for detection and/or diagnosis of SARS-CoV-2 by FDA under an Emergency Use Authorization (EUA).  This EUA will remain in effect (meaning this test can be used) for the duration of  the COVID-19 de claration under Section 564(b)(1) of the Act, 21 U.S.C. section 360bbb-3(b)(1), unless the authorization is terminated or revoked sooner.   Troponin I (High Sensitivity)     Status: None   Collection Time: 05/11/19  6:31 AM  Result Value Ref Range   Troponin I (High Sensitivity) 4 <18 ng/L    Comment:  (NOTE) Elevated high sensitivity troponin I (hsTnI) values and significant  changes across serial measurements may suggest ACS but many other  chronic and acute conditions are known to elevate hsTnI results.  Refer to the "Links" section for chest pain algorithms and additional  guidance. Performed at [facility], [place] [address], [place],  [address]    CT Angio Chest PE W and/or Wo Contrast  Result Date: 05/11/2019 CLINICAL DATA:  Dyspnea.  Upper back pain for 1 week. EXAM: CT ANGIOGRAPHY CHEST, ABDOMEN AND PELVIS TECHNIQUE: Multidetector CT imaging through the chest, abdomen and pelvis was performed using the standard protocol during bolus administration of intravenous contrast. Multiplanar reconstructed images and MIPs were obtained and reviewed to evaluate the vascular anatomy. CONTRAST:  154m OMNIPAQUE IOHEXOL 350 MG/ML SOLN COMPARISON:  Chest radiograph from earlier today. 06/12/2017 screening chest CT. 02/28/2013 CT abdomen/pelvis. FINDINGS: CTA CHEST FINDINGS Cardiovascular: Normal heart size. No significant pericardial effusion/thickening. Three-vessel coronary atherosclerosis. Atherosclerotic thoracic aorta. There is a thick-walled very irregularly contoured aneurysm involving an approximately 5 cm in length segment of proximal to mid descending thoracic aorta, measuring 5.0 cm in maximum AP diameter (series 21/image 70) and 5.5 cm in transverse diameter (series 11/image 58), new since 03/20 5019 unenhanced chest CT study. The thoracic aortic aneurysm wall is hyperdense, suggesting acute hemorrhage into the aneurysm wall. No discrete aortic dissection flap. Normal caliber pulmonary arteries. No pulmonary emboli. Mediastinum/Nodes: No discrete thyroid nodules. Unremarkable esophagus. No pathologically enlarged axillary, mediastinal or hilar lymph nodes. Lungs/Pleura: No pneumothorax. No pleural effusion. No acute consolidative airspace disease, lung masses or significant pulmonary  nodules. Mild paraseptal emphysema. Musculoskeletal: No aggressive appearing focal osseous lesions. Healing subacute nondisplaced lower sternal  fracture. Mild thoracic spondylosis. Review of the MIP images confirms the above findings. CTA ABDOMEN AND PELVIS FINDINGS VASCULAR Aorta: Atherosclerotic abdominal aorta with ectatic 2.5 cm infrarenal abdominal aorta, increased from 2.0 cm on 02/28/2013. No abdominal aortic dissection. Celiac: Patent without evidence of aneurysm, dissection, vasculitis or significant stenosis. SMA: Patent without evidence of aneurysm, dissection, vasculitis or significant stenosis. Renals: Both renal arteries are patent without evidence of aneurysm, dissection, vasculitis, fibromuscular dysplasia or significant stenosis. IMA: Patent without evidence of aneurysm, dissection, vasculitis or significant stenosis. Inflow: Moderate stenosis of the left common iliac artery near the bifurcation (series 11/image 190). Otherwise no significant stenoses. Veins: No obvious venous abnormality within the limitations of this arterial phase study. Review of the MIP images confirms the above findings. NON-VASCULAR Hepatobiliary: Normal liver with no liver mass. Cholecystectomy. Bile ducts are stable and within normal post cholecystectomy limits with CBD diameter 6 mm. Pancreas: Normal, with no mass or duct dilation. Spleen: Normal size. No mass. Adrenals/Urinary Tract: Normal adrenals. No hydronephrosis. No contour deforming renal masses. Normal bladder. Stomach/Bowel: Normal non-distended stomach. Normal caliber small bowel with no small bowel wall thickening. Candidate normal appendix. Moderate sigmoid diverticulosis. No large bowel wall thickening or acute pericolonic fat stranding. Vascular/Lymphatic: No pathologically enlarged lymph nodes in the abdomen or pelvis. Reproductive: Status post hysterectomy, with no abnormal findings at the vaginal cuff. No adnexal mass. Other: No pneumoperitoneum, ascites  or focal fluid collection. Small fat containing periumbilical hernia. Musculoskeletal: No aggressive appearing focal osseous lesions. Review of the MIP images confirms the above findings. IMPRESSION: 1. Thick-walled thoracic aortic aneurysm with very irregular contour in the proximal to the mid descending thoracic aorta measuring 5.5 cm transverse by 5.0 cm AP x 5.0 cm length, new since 06/12/2017 chest CT. High density within the aneurysm wall suggests acute hemorrhage into the wall/unstable aneurysm. Emergent thoracic surgical consultation advised. Differential includes mycotic aneurysm versus unstable penetrating atherosclerotic ulcer. 2. Three-vessel coronary atherosclerosis. 3. Infrarenal 2.5 cm ectatic abdominal aorta, at risk for aneurysm development. Recommend follow-up aortic ultrasound in 5 years. This recommendation follows ACR consensus guidelines: White Paper of the ACR Incidental Findings Committee II on Vascular Findings. J Am Coll Radiol 2013; 50:354-656. 4.  Aortic Atherosclerosis (ICD10-I70.0). 5. Nondisplaced healing subacute lower sternal fracture. Correlate for injury history. 6. Moderate sigmoid diverticulosis. Critical Value/emergent results were called by telephone at the time of interpretation on 05/11/2019 at 8:56 am to provider [facility] , who verbally acknowledged these results. Electronically Signed   By: Ilona Sorrel M.D.   On: 05/11/2019 09:07   DG Chest Port 1 View  Result Date: 05/11/2019 CLINICAL DATA:  Fever EXAM: PORTABLE CHEST 1 VIEW COMPARISON:  01/05/2019 chest radiograph. FINDINGS: Stable cardiomediastinal silhouette with normal heart size. No pneumothorax. No pleural effusion. Lungs appear clear, with no acute consolidative airspace disease and no pulmonary edema. IMPRESSION: No active disease. Electronically Signed   By: Ilona Sorrel M.D.   On: 05/11/2019 04:44   CT Angio Chest/Abd/Pel for Dissection W and/or W/WO  Result Date: 05/11/2019 CLINICAL DATA:  Dyspnea.   Upper back pain for 1 week. EXAM: CT ANGIOGRAPHY CHEST, ABDOMEN AND PELVIS TECHNIQUE: Multidetector CT imaging through the chest, abdomen and pelvis was performed using the standard protocol during bolus administration of intravenous contrast. Multiplanar reconstructed images and MIPs were obtained and reviewed to evaluate the vascular anatomy. CONTRAST:  158m OMNIPAQUE IOHEXOL 350 MG/ML SOLN COMPARISON:  Chest radiograph from earlier today. 06/12/2017 screening chest CT. 02/28/2013 CT abdomen/pelvis. FINDINGS: CTA  CHEST FINDINGS Cardiovascular: Normal heart size. No significant pericardial effusion/thickening. Three-vessel coronary atherosclerosis. Atherosclerotic thoracic aorta. There is a thick-walled very irregularly contoured aneurysm involving an approximately 5 cm in length segment of proximal to mid descending thoracic aorta, measuring 5.0 cm in maximum AP diameter (series 21/image 70) and 5.5 cm in transverse diameter (series 11/image 58), new since 03/20 5019 unenhanced chest CT study. The thoracic aortic aneurysm wall is hyperdense, suggesting acute hemorrhage into the aneurysm wall. No discrete aortic dissection flap. Normal caliber pulmonary arteries. No pulmonary emboli. Mediastinum/Nodes: No discrete thyroid nodules. Unremarkable esophagus. No pathologically enlarged axillary, mediastinal or hilar lymph nodes. Lungs/Pleura: No pneumothorax. No pleural effusion. No acute consolidative airspace disease, lung masses or significant pulmonary nodules. Mild paraseptal emphysema. Musculoskeletal: No aggressive appearing focal osseous lesions. Healing subacute nondisplaced lower sternal fracture. Mild thoracic spondylosis. Review of the MIP images confirms the above findings. CTA ABDOMEN AND PELVIS FINDINGS VASCULAR Aorta: Atherosclerotic abdominal aorta with ectatic 2.5 cm infrarenal abdominal aorta, increased from 2.0 cm on 02/28/2013. No abdominal aortic dissection. Celiac: Patent without evidence of  aneurysm, dissection, vasculitis or significant stenosis. SMA: Patent without evidence of aneurysm, dissection, vasculitis or significant stenosis. Renals: Both renal arteries are patent without evidence of aneurysm, dissection, vasculitis, fibromuscular dysplasia or significant stenosis. IMA: Patent without evidence of aneurysm, dissection, vasculitis or significant stenosis. Inflow: Moderate stenosis of the left common iliac artery near the bifurcation (series 11/image 190). Otherwise no significant stenoses. Veins: No obvious venous abnormality within the limitations of this arterial phase study. Review of the MIP images confirms the above findings. NON-VASCULAR Hepatobiliary: Normal liver with no liver mass. Cholecystectomy. Bile ducts are stable and within normal post cholecystectomy limits with CBD diameter 6 mm. Pancreas: Normal, with no mass or duct dilation. Spleen: Normal size. No mass. Adrenals/Urinary Tract: Normal adrenals. No hydronephrosis. No contour deforming renal masses. Normal bladder. Stomach/Bowel: Normal non-distended stomach. Normal caliber small bowel with no small bowel wall thickening. Candidate normal appendix. Moderate sigmoid diverticulosis. No large bowel wall thickening or acute pericolonic fat stranding. Vascular/Lymphatic: No pathologically enlarged lymph nodes in the abdomen or pelvis. Reproductive: Status post hysterectomy, with no abnormal findings at the vaginal cuff. No adnexal mass. Other: No pneumoperitoneum, ascites or focal fluid collection. Small fat containing periumbilical hernia. Musculoskeletal: No aggressive appearing focal osseous lesions. Review of the MIP images confirms the above findings. IMPRESSION: 1. Thick-walled thoracic aortic aneurysm with very irregular contour in the proximal to the mid descending thoracic aorta measuring 5.5 cm transverse by 5.0 cm AP x 5.0 cm length, new since 06/12/2017 chest CT. High density within the aneurysm wall suggests acute  hemorrhage into the wall/unstable aneurysm. Emergent thoracic surgical consultation advised. Differential includes mycotic aneurysm versus unstable penetrating atherosclerotic ulcer. 2. Three-vessel coronary atherosclerosis. 3. Infrarenal 2.5 cm ectatic abdominal aorta, at risk for aneurysm development. Recommend follow-up aortic ultrasound in 5 years. This recommendation follows ACR consensus guidelines: White Paper of the ACR Incidental Findings Committee II on Vascular Findings. J Am Coll Radiol 2013; 50:932-671. 4.  Aortic Atherosclerosis (ICD10-I70.0). 5. Nondisplaced healing subacute lower sternal fracture. Correlate for injury history. 6. Moderate sigmoid diverticulosis. Critical Value/emergent results were called by telephone at the time of interpretation on 05/11/2019 at 8:56 am to provider [facility] , who verbally acknowledged these results. Electronically Signed   By: Ilona Sorrel M.D.   On: 05/11/2019 09:07    Review Of Systems Constitutional: No fever, chills, chronicweight loss. Eyes: No vision change, wears glasses. No discharge or  pain. Ears: No hearing loss, No tinnitus. Respiratory: No asthma, positive COPD, pneumonias, shortness of breath. No hemoptysis. Cardiovascular: Positive chest pain, palpitation, no leg edema. Gastrointestinal: Positive nausea, vomiting, diarrhea, constipation. No GI bleed. No hepatitis. Genitourinary: No dysuria, hematuria, kidney stone. No incontinance. Neurological: Positive headache, stroke, no seizures.  Psychiatry: No psych facility admission for anxiety, depression, suicide. No detox. Skin: No rash. Musculoskeletal: Positive joint pain, fibromyalgia, neck pain, back pain. Lymphadenopathy: No lymphadenopathy. Hematology: No anemia or easy bruising.   Blood pressure 122/82, pulse 84, temperature (!) 100.4 F (38 C), temperature source Oral, resp. rate 18, SpO2 98 %. There is no height or weight on file to calculate BMI. General appearance:  alert, cooperative, appears stated age and no distress Head: Normocephalic, atraumatic. Eyes: Blue eyes, pink conjunctiva, corneas clear. PERRL, EOM's intact. Neck: No adenopathy, no carotid bruit, no JVD, supple, symmetrical, trachea midline and thyroid not enlarged. Resp: Clear to auscultation bilaterally. Cardio: Regular rate and rhythm, S1, S2 normal, II/VI systolic murmur, no click, rub or gallop GI: Soft, mildly-tender epigastric area; bowel sounds normal; no organomegaly. Back: Upper and mid back tenderness on palpation. Extremities: No edema, cyanosis or clubbing. Skin: Warm and dry.  Neurologic: Alert and oriented X 3, normal strength. Normal coordination.  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 Hypertension Chronic anxiety Anemia Moderate protein calorie malnutrition Mild hyperglycemia  Follow with vascular surgery.  Time spent: Review of old records, Lab, x-rays, EKG, other cardiac tests, examination, discussion with patient, nurse, consultants over 70 minutes.  Birdie Riddle, MD  05/11/2019, 10:06 AM

## 2019-05-11 NOTE — Anesthesia Postprocedure Evaluation (Signed)
Anesthesia Post Note  Patient: Brittany [name]  Procedure(s) Performed: THORACIC AORTIC ENDOVASCULAR STENT GRAFT, RETROPERITONEAL CONDUIT RIGHT ILIAC ARTERY (N/A )     Patient location during evaluation: PACU Anesthesia Type: General Level of consciousness: awake and alert Pain management: pain level controlled Vital Signs Assessment: post-procedure vital signs reviewed and stable Respiratory status: spontaneous breathing, nonlabored ventilation, respiratory function stable and patient connected to nasal cannula oxygen Cardiovascular status: blood pressure returned to baseline and stable Postop Assessment: no apparent nausea or vomiting Anesthetic complications: no    Last Vitals:  Vitals:   05/11/19 1637 05/11/19 1655  BP:  124/68  Pulse: 84 86  Resp: 14 13  Temp:    SpO2: 97% 97%    Last Pain:  Vitals:   05/11/19 1630  TempSrc:   PainSc: Asleep                 Jeff Mccallum DAVID

## 2019-05-11 NOTE — ED Notes (Signed)
Vascular surgeon at bedside.

## 2019-05-11 NOTE — Anesthesia Procedure Notes (Signed)
Procedure Name: Intubation Date/Time: 05/11/2019 12:54 PM Performed by: Elliot Dally, CRNA Pre-anesthesia Checklist: Patient identified, Emergency Drugs available, Suction available and Patient being monitored Patient Re-evaluated:Patient Re-evaluated prior to induction Oxygen Delivery Method: Circle System Utilized Preoxygenation: Pre-oxygenation with 100% oxygen Induction Type: IV induction Ventilation: Mask ventilation without difficulty Laryngoscope Size: Miller and 2 Grade View: Grade I Tube type: Oral Tube size: 7.0 mm Number of attempts: 1 Airway Equipment and Method: Stylet and Oral airway Placement Confirmation: ETT inserted through vocal cords under direct vision,  positive ETCO2 and breath sounds checked- equal and bilateral Secured at: 19 cm Tube secured with: Tape Dental Injury: Teeth and Oropharynx as per pre-operative assessment

## 2019-05-11 NOTE — Progress Notes (Signed)
Patient placed on monitor on arrival to Short Stay with esmolol drip. NSR rate 70's. SpO2 98% RA, BP 122/64.

## 2019-05-11 NOTE — ED Provider Notes (Signed)
Patient care assumed pending CT PE study for one week of back pain. Call to scanner by radiology for CTA PE protocol concerning for aortic aneurysm. Will check CTA dissection study to further evaluate aneurysm detail. Patient is well perfused on examination with symmetric radial and DP pulses. She complains of one week of thoracic back pain. Will treat with morphine for pain control.  Discussed with radiologist results of CTA. 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 Patient does have a heart rate in the 80s to 90s, will start esmolol for rate control. CT surgery consulted. Discussed with Dr. Dorris Fetch with CT surgery, recommends vascular surgery consult. Discussed with Dr. Durwin Nora with vascular surgery, who will see the patient and consult.  Discussed the case with Dr. Algie Coffer, will see the patient in consult.  Patient updated of findings of studies and recommendation for admission and she is in agreement with treatment plan.   CRITICAL CARE Performed by: Tilden Fossa   Total critical care time: 45 minutes  Critical care time was exclusive of separately billable procedures and treating other patients.  Critical care was necessary to treat or prevent imminent or life-threatening deterioration.  Critical care was time spent personally by me on the following activities: development of treatment plan with patient and/or surrogate as well as nursing, discussions with consultants, evaluation of patient's response to treatment, examination of patient, obtaining history from patient or surrogate, ordering and performing treatments and interventions, ordering and review of laboratory studies, ordering and review of radiographic studies, pulse oximetry and re-evaluation of patient's condition.    Tilden Fossa, MD 05/11/19 1122

## 2019-05-11 NOTE — ED Provider Notes (Addendum)
  Physical Exam  BP (!) 180/92   Pulse (!) 106   Temp (!) 100.4 F (38 C) (Oral)   Resp (!) 28   SpO2 100%   Physical Exam Vitals and nursing note reviewed.  Constitutional:      Appearance: Normal appearance. She is normal weight. She is not toxic-appearing or diaphoretic.     Comments: Appears uncomfortable  HENT:     Head: Normocephalic.  Eyes:     Conjunctiva/sclera: Conjunctivae normal.  Pulmonary:     Effort: Pulmonary effort is normal.  Skin:    General: Skin is dry.  Neurological:     Mental Status: She is alert.  Psychiatric:        Mood and Affect: Mood normal.     ED Course/Procedures   Clinical Course as of May 11 1311  Sat May 11, 2019  2536 Notified of patient with rupturing thoracic aneurism on CTA. Dr. Madilyn Hook aware and evaluating patient. Vitals are stable at this time.    [KM]  1313 Patient admitted to ICU for treatment and vascular surgery later today.   [KM]    Clinical Course User Index [KM] Arlyn Dunning, PA-C    Procedures  MDM  Patient care assumed from [facility] PA due to change of shift. See his note for full HPI and workup. This is a 64 y/o F presenting with back pain. Recently diagnosed with muscle spasm of the back at PMD and give muscle relaxants. Reports no better so came to the ER. Patient with tachycardia on exam. Further workup was ordered including labs which revelealed an elevated Ddimer. Pending CTA at this time.         Arlyn Dunning, PA-C 05/11/19 1314    Jeral Pinch 05/13/19 0911    Tilden Fossa, MD 05/13/19 [phone]

## 2019-05-11 NOTE — Anesthesia Procedure Notes (Signed)
Central Venous Catheter Insertion Performed by: Arta Bruce, MD, anesthesiologist Start/End2/20/2021 12:14 PM, 05/11/2019 12:24 PM Patient location: Pre-op. Preanesthetic checklist: patient identified, IV checked, risks and benefits discussed, surgical consent, monitors and equipment checked, pre-op evaluation, timeout performed and anesthesia consent Position: Trendelenburg Lidocaine 1% used for infiltration and patient sedated Hand hygiene performed  and maximum sterile barriers used  Catheter size: 8.5 Fr Central line was placed.Sheath introducer Procedure performed using ultrasound guided technique. Ultrasound Notes:anatomy identified, needle tip was noted to be adjacent to the nerve/plexus identified, no ultrasound evidence of intravascular and/or intraneural injection and image(s) printed for medical record Attempts: 1 Following insertion, line sutured, dressing applied and Biopatch. Post procedure assessment: blood return through all ports, free fluid flow and no air  Patient tolerated the procedure well with no immediate complications.

## 2019-05-11 NOTE — ED Notes (Addendum)
Cardiothoracic surgeon at bedside 

## 2019-05-11 NOTE — Anesthesia Preprocedure Evaluation (Signed)
Anesthesia Evaluation  Patient identified by MRN, date of birth, ID band Patient awake    Reviewed: Allergy & Precautions, NPO status , Patient's Chart, lab work & pertinent test results  Airway Mallampati: I  TM Distance: >3 FB Neck ROM: Full    Dental   Pulmonary COPD, Current Smoker,    Pulmonary exam normal        Cardiovascular hypertension, Pt. on medications + Peripheral Vascular Disease  Normal cardiovascular exam     Neuro/Psych Anxiety Depression Dementia CVA    GI/Hepatic GERD  Medicated and Controlled,(+) Hepatitis -, C  Endo/Other    Renal/GU Renal disease     Musculoskeletal   Abdominal   Peds  Hematology   Anesthesia Other Findings   Reproductive/Obstetrics                             Anesthesia Physical Anesthesia Plan  ASA: III and emergent  Anesthesia Plan: General   Post-op Pain Management:    Induction: Intravenous  PONV Risk Score and Plan: 2 and Ondansetron and Midazolam  Airway Management Planned: Oral ETT  Additional Equipment: Arterial line, CVP and Ultrasound Guidance Line Placement  Intra-op Plan:   Post-operative Plan: Possible Post-op intubation/ventilation  Informed Consent: I have reviewed the patients History and Physical, chart, labs and discussed the procedure including the risks, benefits and alternatives for the proposed anesthesia with the patient or authorized representative who has indicated his/her understanding and acceptance.       Plan Discussed with: CRNA and Surgeon  Anesthesia Plan Comments:         Anesthesia Quick Evaluation

## 2019-05-11 NOTE — ED Provider Notes (Signed)
[facility] EMERGENCY DEPARTMENT Provider Note   CSN: [id number] Arrival date & time: 05/10/19  2251     History Chief Complaint  Patient presents with  . Back Pain    Brittany [name] is a 64 y.o. female.  Patient presents to the emergency department with a chief complaint of back pain x1 week.  She states that the symptoms started after falling asleep on the hard floor.  She states that her air mattress deflated.  She states that she then took out the garbage and had worsening back pain.  She states that it feels like spasms.  She reports some shortness of breath, and some chest pain, but states that the chest pain is baseline for her, and that her shortness of breath seems like her typical COPD.  She was seen by her regular doctor and was prescribed a muscle relaxer.  She denies any improvement.  She denies any fevers or chills, but is noted to be febrile to 100.4 degrees in triage.  She denies any cough.  Denies any abdominal pain.  Denies any other associated symptoms.  The history is provided by the patient. No language interpreter was used.       Past Medical History:  Diagnosis Date  . Alzheimer disease (HCC)   . Anxiety   . Bilateral pneumonia 01/2017   per new patient packet   . COPD (chronic obstructive pulmonary disease) (HCC) 01/2017   per new patient packet   . Depression   . Fatty liver   . Gallbladder disease 2014   per new patient packet   . Hernia, abdominal   . Hypertension   . Liver damage 01/2017   per new patient packet, Dr.Kadakia  . Normal cardiac stress test 2007  . Ovarian cyst 1977   8 1/2 lb left ovarian cyst, Dr.Cox, per new patient packet   . Pneumonia   . Right ovarian cyst    1992, 1993, 1994, 1995, and 1996 Dr.Neil, per new patient packet   . Stroke (HCC)    4 strokes.     Patient Active Problem List   Diagnosis Date Noted  . Cerebrovascular accident (CVA) (HCC) 02/19/2019  . Lacunar infarct, acute (HCC) 01/05/2019    . Altered mental status associated with intoxication (HCC) 01/05/2019  . Hypotension 01/05/2019  . Left wrist fracture 01/05/2019  . AKI (acute kidney injury) (HCC) 01/05/2019  . Restless leg syndrome 07/10/2018  . Peripheral neuropathy 07/10/2018  . COPD (chronic obstructive pulmonary disease) (HCC) 10/05/2017  . GERD (gastroesophageal reflux disease) 10/05/2017  . Depression 10/05/2017  . Facial cellulitis-left 10/05/2017  . Diarrhea 10/05/2017  . Chronic viral hepatitis C (HCC) 06/20/2017  . Idiopathic peripheral neuropathy 05/19/2017  . Chronic low back pain without sciatica 05/19/2017  . Hyperglycemia 05/19/2017  . Chronic bronchitis (HCC) 03/08/2017  . Elevated LFTs 03/08/2017  . 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 GAD (generalized anxiety disorder) 03/08/2017  . Hepatomegaly 03/08/2017  . Acute exacerbation of chronic obstructive pulmonary disease (COPD) (HCC) 02/16/2017  . Acute respiratory failure with hypoxia (HCC)   . COPD with acute exacerbation (HCC) 01/27/2017  . CAP (community acquired pneumonia) 01/27/2017  . Tachycardia 01/27/2017  . COPD exacerbation (HCC) 01/27/2017  . Hypokalemia 01/27/2017  . Left arm numbness 11/28/2016    Class: Chronic  . Nondisplaced transverse fracture of left patella, initial encounter for closed fracture 06/10/2016  . Malnutrition of moderate degree (HCC) 07/12/2013  . Syncope  07/11/2013  . GASTROENTERITIS 05/07/2010  . TOBACCO ABUSE 07/01/2009  . Benign essential HTN 06/24/2009  . BRONCHITIS, ACUTE 06/24/2009    Past Surgical History:  Procedure Laterality Date  . ABDOMINAL HYSTERECTOMY Bilateral    Total  . CESAREAN SECTION    . CHOLECYSTECTOMY    . INGUINAL HERNIA REPAIR Right   . LEFT OOPHORECTOMY  1977   with cyst removal, age 62  . MINOR EXCISION OF ORAL LESION N/A 10/07/2017   Procedure: IRRIGATION AND DEBRIDEMENT OF ORAL INFECTION, REMOVAL OF REMAINING 8  TEETH AND PLACEMENT OF PENROSE DRAINS;  Surgeon: Vivia Ewing, DMD;  Location: [facility] ORS;  Service: Oral Surgery;  Laterality: N/A;  . OVARIAN CYST REMOVAL Right 93, 94,95, 96  . UMBILICAL HERNIA REPAIR       OB History   No obstetric history on file.     Family History  Problem Relation Age of Onset  . Stroke Mother   . COPD Mother   . Dementia Mother   . Breast cancer Mother   . Diabetes Father   . Heart attack Father   . Diabetes Sister   . Stroke Sister        x2  . Hepatitis C Daughter   . Diabetes Paternal Grandmother     Social History   Tobacco Use  . Smoking status: Current Every Day Smoker    Packs/day: 0.50    Years: 46.00    Pack years: 23.00    Types: Cigarettes  . Smokeless tobacco: Never Used  Substance Use Topics  . Alcohol use: Yes    Alcohol/week: 1.0 standard drinks    Types: 1 Cans of beer per week    Comment: once or twice a year.   . 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 Authorizing Provider  albuterol (PROVENTIL HFA;VENTOLIN HFA) 108 (90 Base) MCG/ACT inhaler Inhale 2 puffs into the lungs every 6 (six) hours as needed for wheezing or shortness of breath.    [provider]  alendronate (FOSAMAX) 70 MG tablet Take with a full glass of water on an empty stomach. 05/06/19   Ngetich, Dinah C, NP  aspirin EC 81 MG EC tablet Take 1 tablet (81 mg total) by mouth daily. 01/07/19   Jae Dire, MD  atorvastatin (LIPITOR) 20 MG tablet Take 1 tablet (20 mg total) by mouth daily at 6 PM. 01/06/19   Jae Dire, MD  budesonide-formoterol [facility]) 160-4.5 MCG/ACT inhaler Inhale 2 puffs into the lungs 2 (two) times daily as needed (for flares).     [provider]  citalopram (CELEXA) 40 MG tablet Take 0.5 tablets (20 mg total) by mouth daily for 30 days. 05/04/18 01/06/19  Ngetich, Dinah C, NP  clopidogrel  (PLAVIX) 75 MG tablet Take 1 tablet (75 mg total) by mouth daily. 01/07/19   Jae Dire, MD  gabapentin (NEURONTIN) 300 MG capsule Take 2 capsules (600 mg total) by mouth 2 (two) times daily. Take 600 mg by mouth in the morning and 600 mg at bedtime 02/19/19   Levert Feinstein, MD  losartan (COZAAR) 50 MG tablet Take 1 tablet (50 mg total) daily by mouth. 02/02/17   Vassie Loll, MD  methocarbamol (ROBAXIN) 500 MG tablet Take 1 tablet (500 mg total) by mouth every 8 (eight) hours as needed for up to 7 days for muscle  spasms. 05/06/19 05/13/19  Ngetich, Dinah C, NP  nitroGLYCERIN (NITROSTAT) 0.4 MG SL tablet Place 1 tablet (0.4 mg total) under the tongue every 5 (five) minutes x 3 doses as needed for chest pain (chest pain). 02/18/17   Orpah Cobb, MD    Allergies    Amoxicillin, Clindamycin/lincomycin, Ropinirole, Wellbutrin [bupropion], Sulfamethoxazole, and Sulfonamide derivatives  Review of Systems   Review of Systems  All other systems reviewed and are negative.   Physical Exam Updated Vital Signs BP (!) 176/98   Pulse (!) 109   Temp (!) 100.4 F (38 C) (Oral)   Resp 15   SpO2 100%   Physical Exam Vitals and nursing note reviewed.  Constitutional:      General: She is not in acute distress.    Appearance: She is well-developed.  HENT:     Head: Normocephalic and atraumatic.  Eyes:     Conjunctiva/sclera: Conjunctivae normal.  Cardiovascular:     Rate and Rhythm: Normal rate and regular rhythm.     Heart sounds: No murmur.     Comments: Tachycardic Pulmonary:     Effort: Pulmonary effort is normal. No respiratory distress.     Breath sounds: Normal breath sounds.  Abdominal:     Palpations: Abdomen is soft.     Tenderness: There is no abdominal tenderness.  Musculoskeletal:     Cervical back: Neck supple.     Comments: No bony step-off or deformity of the CTLS spine, back musculature is soft, and without evidence of spasm, pain is out of proportion to exam Moves all  extremities  Skin:    General: Skin is warm and dry.  Neurological:     Mental Status: She is alert and oriented to person, place, and time.  Psychiatric:     Comments: Anxious     ED Results / Procedures / Treatments   Labs (all labs ordered are listed, but only abnormal results are displayed) Labs Reviewed  CBC WITH DIFFERENTIAL/PLATELET  BASIC METABOLIC PANEL  D-DIMER, QUANTITATIVE (NOT AT [facility])  TROPONIN I (HIGH SENSITIVITY)    EKG None  Radiology No results found.  Procedures Procedures (including critical care time)  Medications Ordered in ED Medications  morphine 4 MG/ML injection 4 mg (has no administration in time range)  ondansetron (ZOFRAN) injection 4 mg (has no administration in time range)    ED Course  I have reviewed the triage vital signs and the nursing notes.  Pertinent labs & imaging results that were available during my care of the patient were reviewed by me and considered in my medical decision making (see chart for details).    MDM Rules/Calculators/A&P                      Patient with severe back pain.  She also reports shortness of breath.  Her pain is out of proportion to exam.  She did not have any evidence of muscle spasm on my exam, nor does she have muscular tenderness.  She is noted to be tachycardic and tachypneic with a mild fever of 100.4.  We will check Covid.  Also consider PE given shortness of breath.  D-dimer is elevated at 1.61.  We will proceed with CT PE study.  POC Covid negative.  CT PE pending.  Patient signed out to [facility], who will continue care.   Final Clinical Impression(s) / ED Diagnoses Final diagnoses:  None    Rx / DC Orders ED Discharge Orders  None       Montine Circle, PA-C 05/11/19 5170    Ripley Fraise, MD 05/15/19 [phone]

## 2019-05-11 NOTE — Consult Note (Addendum)
ASSESSMENT & PLAN   SYMPTOMATIC 5.5 CM THORACIC AORTIC ANEURYSM: This patient has a symptomatic thoracic aortic aneurysm.  She appears to have a penetrating ulcer with intramural hematoma.  Her symptoms are better now that her blood pressure is better controlled on labetalol, however she is continuing to have back pain.  This reason I have recommended urgent repair today given the risk of rupture.  Based on my review of the films I think she would be a candidate for TEVAR.  I have discussed the indications for the procedure with the patient, that is to prevent rupture.  I have also discussed the potential complications of surgery, including but not limited to paraplegia, renal failure, bowel ischemia, MI, left upper extremity ischemia, bleeding, and death.  All of her questions were answered and she is agreeable to proceed.  In addition, I am very concerned about the size of her iliofemoral vessels.  Given the size of the stent that will be required I do not think she is a good candidate for femoral access and therefore I have explained to her that we will likely have to do a retroperitoneal exposure so that we can introduce the device through the common iliac artery.  Given the small size of her arteries she is at increased risk for complications related to access.  AORTIC ATHEROSCLEROSIS: I do not get any clear-cut history of claudication or rest pain.  COPD: She is on inhalers.  Currently she has no significant wheezing.  HYPERTENSION: Her blood pressure is under better control on esmolol.  REASON FOR CONSULT:    Symptomatic thoracic aneurysm.  The consult is requested by the emergency department.  HPI:   Brittany [name] is a 64 y.o. female who has a history of some chronic low back pain.  However a week ago she developed the gradual onset of fairly severe upper back pain which is persisted for the last week.  She sleeps on an air mattress and apparently the air mattress had a hole and so she  has been sleeping on a hard floor and had attributed the pain to this.  She did receive some muscle relaxants which did not really help her back pain.  Given her persistent pain she presented to the emergency department.  She underwent a CT scan to rule out a pulmonary embolus and also to evaluate her aorta and was found to have a large thoracic aortic aneurysm with intramural hematoma and a penetrating ulcer.  This reason vascular surgery was consulted.  The patient does have a history of COPD.  She continues to smoke a pack per day of cigarettes and has been smoking for as long as she can remember.  She also has a history of hypertension.  To the best of her knowledge this has been under good control although when she began having pain her blood pressure was significantly higher.  She denies any history of myocardial infarction or history of congestive heart failure.  She does admit to some dyspnea on exertion.  Her risk factors for peripheral vascular disease include hypertension, a family history of premature cardiovascular disease, and tobacco use.  She denies any history of diabetes or hypercholesterolemia.  She has had a history of multiple strokes in the past and has reportedly some underlying dementia.  She has not had anything to eat or drink today.  Her renal function is normal.  Past Medical History:  Diagnosis Date  . Alzheimer disease (HCC)   . Anxiety   .  Bilateral pneumonia 01/2017   per new patient packet   . COPD (chronic obstructive pulmonary disease) (HCC) 01/2017   per new patient packet   . Depression   . Fatty liver   . Gallbladder disease 2014   per new patient packet   . Hernia, abdominal   . Hypertension   . Liver damage 01/2017   per new patient packet, Dr.Kadakia  . Normal cardiac stress test 2007  . Ovarian cyst 1977   8 1/2 lb left ovarian cyst, Dr.Cox, per new patient packet   . Pneumonia   . Right ovarian cyst    1992, 1993, 1994, 1995, and 1996  Dr.Neil, per new patient packet   . Stroke (HCC)    4 strokes.     Family History  Problem Relation Age of Onset  . Stroke Mother   . COPD Mother   . Dementia Mother   . Breast cancer Mother   . Diabetes Father   . Heart attack Father   . Diabetes Sister   . Stroke Sister        x2  . Hepatitis C Daughter   . Diabetes Paternal Grandmother     SOCIAL HISTORY: Social History   Tobacco Use  . Smoking status: Current Every Day Smoker    Packs/day: 0.50    Years: 46.00    Pack years: 23.00    Types: Cigarettes  . Smokeless tobacco: Never Used  Substance Use Topics  . Alcohol use: Yes    Alcohol/week: 1.0 standard drinks    Types: 1 Cans of beer per week    Comment: once or twice a year.     Allergies  Allergen Reactions  . Amoxicillin Hives and Rash     Has patient had a PCN reaction causing immediate rash, facial/tongue/throat swelling, SOB or lightheadedness with hypotension: Yes, hives Has patient had a PCN reaction causing severe rash involving mucus membranes or skin necrosis: No Has patient had a PCN reaction that required hospitalization No Has patient had a PCN reaction occurring within the last 10 years: Yes If all of the above answers are "NO", then may proceed with Cephalosporin use.   . Clindamycin/Lincomycin Diarrhea and Other (See Comments)    Patient states that clindamycin causes diarrhea   . Ropinirole Other (See Comments)    "Made me insane" (per the patient)  . Wellbutrin [Bupropion] Diarrhea and Other (See Comments)    Lightheadedness, also  . Sulfamethoxazole Nausea And Vomiting and Rash  . Sulfonamide Derivatives Nausea And Vomiting and Rash    Current Facility-Administered Medications  Medication Dose Route Frequency Provider Last Rate Last Admin  . esmolol (BREVIBLOC) 2000 mg / 100 mL (20 mg/mL) infusion  25-300 mcg/kg/min Intravenous Titrated Tilden Fossa, MD 10.64 mL/hr at 05/11/19 0952 75 mcg/kg/min at 05/11/19 0952  . sodium  chloride flush (NS) 0.9 % injection 10-40 mL  10-40 mL Intracatheter Q12H Zadie Rhine, MD      . sodium chloride flush (NS) 0.9 % injection 10-40 mL  10-40 mL Intracatheter PRN Zadie Rhine, MD       Current Outpatient Medications  Medication Sig Dispense Refill  . albuterol (PROVENTIL HFA;VENTOLIN HFA) 108 (90 Base) MCG/ACT inhaler Inhale 2 puffs into the lungs every 6 (six) hours as needed for wheezing or shortness of breath.    Marland Kitchen alendronate (FOSAMAX) 70 MG tablet Take with a full glass of water on an empty stomach. 12 tablet 0  . aspirin EC 81 MG EC tablet Take  1 tablet (81 mg total) by mouth daily. 90 tablet 0  . atorvastatin (LIPITOR) 20 MG tablet Take 1 tablet (20 mg total) by mouth daily at 6 PM. 90 tablet 0  . budesonide-formoterol (SYMBICORT) 160-4.5 MCG/ACT inhaler Inhale 2 puffs into the lungs 2 (two) times daily as needed (for flares).     . citalopram (CELEXA) 40 MG tablet Take 0.5 tablets (20 mg total) by mouth daily for 30 days. 30 tablet 3  . clopidogrel (PLAVIX) 75 MG tablet Take 1 tablet (75 mg total) by mouth daily. 21 tablet 0  . gabapentin (NEURONTIN) 300 MG capsule Take 2 capsules (600 mg total) by mouth 2 (two) times daily. Take 600 mg by mouth in the morning and 600 mg at bedtime 120 capsule 11  . losartan (COZAAR) 50 MG tablet Take 1 tablet (50 mg total) daily by mouth. 30 tablet 1  . methocarbamol (ROBAXIN) 500 MG tablet Take 1 tablet (500 mg total) by mouth every 8 (eight) hours as needed for up to 7 days for muscle spasms. 21 tablet 0  . nitroGLYCERIN (NITROSTAT) 0.4 MG SL tablet Place 1 tablet (0.4 mg total) under the tongue every 5 (five) minutes x 3 doses as needed for chest pain (chest pain). 25 tablet 1    REVIEW OF SYSTEMS:  [X]  denotes positive finding, [ ]  denotes negative finding Cardiac  Comments:  Chest pain or chest pressure: x   Shortness of breath upon exertion: x   Short of breath when lying flat:    Irregular heart rhythm:          Vascular    Pain in calf, thigh, or hip brought on by ambulation:    Pain in feet at night that wakes you up from your sleep:     Blood clot in your veins:    Leg swelling:         Pulmonary    Oxygen at home:    Productive cough:     Wheezing:         Neurologic    Sudden weakness in arms or legs:     Sudden numbness in arms or legs:     Sudden onset of difficulty speaking or slurred speech:    Temporary loss of vision in one eye:     Problems with dizziness:         Gastrointestinal    Blood in stool:     Vomited blood:         Genitourinary    Burning when urinating:     Blood in urine:        Psychiatric    Major depression:         Hematologic    Bleeding problems:    Problems with blood clotting too easily:        Skin    Rashes or ulcers:        Constitutional    Fever or chills:    -  PHYSICAL EXAM:   Vitals:   05/11/19 0900 05/11/19 0915 05/11/19 0925 05/11/19 0930  BP: (!) 147/84 135/73 114/75 122/82  Pulse: 92 86 80 84  Resp: 16 18 18    Temp:      TempSrc:      SpO2: 100% 98% 98% 98%   There is no height or weight on file to calculate BMI. GENERAL: The patient is a well-nourished female, in no acute distress. The vital signs are documented above. CARDIAC: There is a regular rate  and rhythm.  VASCULAR: I do not detect carotid bruits. She has palpable femoral, popliteal, dorsalis pedis, and posterior tibial pulses bilaterally. She has no significant lower extremity swelling. PULMONARY: There is good air exchange bilaterally without wheezing or rales. ABDOMEN: Soft and non-tender with normal pitched bowel sounds.  I do not palpate an abdominal aortic aneurysm. MUSCULOSKELETAL: There are no major deformities. NEUROLOGIC: No focal weakness or paresthesias are detected. SKIN: There are no ulcers or rashes noted. PSYCHIATRIC: The patient has a normal affect.  DATA:    CT ANGIOGRAM CHEST ABDOMEN: I reviewed the CT angiogram of the chest and  abdomen.  She does have a large 5.5 cm thoracic aneurysm in the descending thoracic aorta.  There appears to be a penetrating ulcer with intramural hematoma.  Lab Results  Component Value Date   WBC 10.4 05/11/2019   HGB 9.5 (L) 05/11/2019   HCT 31.2 (L) 05/11/2019   MCV 80.8 05/11/2019   PLT 457 (H) 05/11/2019   Lab Results  Component Value Date   NA 139 05/11/2019   K 4.0 05/11/2019   CL 101 05/11/2019   CO2 27 05/11/2019   Lab Results  Component Value Date   CREATININE 0.67 05/11/2019    Lab Results  Component Value Date   HGBA1C 5.8 (H) 01/06/2019    Deitra Mayo [facility] [place]: [phone] Office: [phone]

## 2019-05-11 NOTE — ED Notes (Signed)
Pt reports back pain started a week ago when she slept on the floor.  She went to her PCP who gave her muscle relaxers but they did not work.  Was referred to a "back specialist."

## 2019-05-11 NOTE — Op Note (Signed)
NAME: Brittany [name]    MRN: [id number] DOB: [birth year]    DATE OF OPERATION: 05/11/2019  PREOP DIAGNOSIS:    Symptomatic 5.5 cm thoracic aortic aneurysm  POSTOP DIAGNOSIS:    Same  PROCEDURE:    1.  Retroperitoneal exposure of the right common iliac artery 2.  Placement of iliac conduit (10 mm Dacron graft) [32992] 3.  Thoracic endovascular aneurysm repair using 2 components [33881]  SURGEON: Di Kindle. Edilia Bo, MD  ASSIST: Wendi Maya PA  ANESTHESIA: General  EBL: Minimal  INDICATIONS:    Brittany [name] is a 64 y.o. female who presented with a 1 week history of severe chest pain.  She underwent a CT angiogram which showed a 5.5 cm thoracic aortic aneurysm.  She had a penetrating ulcer and significant intramural hematoma.  It was felt that her symptoms were related to her thoracic aneurysm and emergent repair was recommended.  She had very small common femoral arteries and external iliac arteries and for this reason I did not think she was a candidate for femoral access given that we would require a 20 French sheath to place the appropriate devices.  FINDINGS:   The aneurysm was treated without covering the left subclavian artery.  Completion arteriogram showed an excellent result with no evidence of endoleak.  TECHNIQUE:   The patient was taken to the operating room and received a general anesthetic.  Arterial line and central venous catheter had been placed by anesthesia.  The abdomen and both groins were prepped and draped in usual sterile fashion.  A transplant incision was made on the right side and the dissection carried down to the external oblique aponeurosis.  This was divided.  Subsequently the internal oblique and transversus abdominis muscles were divided and the dissection carried down to the transversalis fascia.  The transversalis fascia was opened with a 15 blade.  I started the retroperitoneal dissection and a small opening in the peritoneum was repaired  with a 3-0 Vicryl.  I dissected into the retroperitoneal space down to the psoas and then medially onto the external iliac artery on the right which was very small and diseased.  The dissection was then continued proximally up onto the common iliac artery all the way up to the aortic bifurcation.  The external iliac artery, internal iliac artery, and common iliac artery were all controlled and the patient was heparinized.  There was some plaque in the common iliac artery but I was able to clamp just at the aortic bifurcation on the right and then controlled the external iliac artery and hypogastric artery.  A longitudinal arteriotomy was made in the common iliac artery.  A 10 mm Dacron graft was slightly spatulated and sewn end-to-side to the right common iliac artery using continuous 5-0 Prolene suture.  Prior to completing this anastomosis the artery was backbled and flushed and the anastomosis completed.  The graft was clamped.  This limb was then positioned for cannulation.  The patient was heparinized and ACT was monitored throughout the procedure.  The iliac conduit was cannulated and a Bentson wire introduced into the infrarenal aorta.  Catheter was used to advance this into the aortic arch and then the Bentson wire exchanged for a Lunderquist wire.  The wire was positioned against the aortic valve.  Next the 20 French dry seal sheath was advanced over the Lunderquist wire and positioned in the descending thoracic aorta.  Using a buddy wire technique a Bentson wire was advanced up into the aortic arch  and a pigtail catheter positioned in the ascending aortic arch.  At a 55 degree LAO projection arch aortogram was obtained to demonstrate the takeoff of the left subclavian artery.  We initially selected a 26 mm x 21 mm x 10 cm device.  This was positioned right at the takeoff of the left subclavian artery and deployed without difficulty.  There was good apposition at the top and bottom of the graft.  Next the  pigtail catheter was removed over a wire.  The second device was positioned with approximately 5 cm of overlap.  The second device was a 26 mm x 21 mm x 10 cm device.  The pigtail catheter was positioned within the graft and arteriogram was obtained to show the position of the aneurysm.  The second graft was deployed with approximately 5 cm of overlap in good seal distally.  Completion arteriogram showed no evidence of endoleak.  Given the pathology we did not think that ballooning the graft was indicated.  Hemostasis was obtained in the wound.  The iliac limb was clamped just above the anastomosis and then oversewn with a 5-0 Prolene suture.  To prevent this small outpouching a large clip was then placed below the suture line above the anastomosis to close some of this dead space.  An additional medial clip was placed proximally.  There was a good pulse distal to the repair.  The deep muscle layer was closed with running 2-0 Vicryl.  The fascial layer was closed with #1 PDS.  The subcutaneous layer was closed with 2 deep layers of 3-0 Vicryl and the skin closed with 4-0 Vicryl.  Dermabond was applied.  The patient tolerated the procedure well was transferred to the recovery room in stable condition.  She had palpable femoral pulses at the completion of the procedure and warm well perfused feet.    Deitra Mayo, MD, FACS [facility]  DATE OF DICTATION:   05/11/2019

## 2019-05-11 NOTE — Plan of Care (Signed)

## 2019-05-12 ENCOUNTER — Other Ambulatory Visit: Payer: Self-pay

## 2019-05-12 LAB — BASIC METABOLIC PANEL
Anion gap: 12 (ref 5–15)
BUN: 10 mg/dL (ref 8–23)
CO2: 22 mmol/L (ref 22–32)
Calcium: 8.1 mg/dL — ABNORMAL LOW (ref 8.9–10.3)
Chloride: 105 mmol/L (ref 98–111)
Creatinine, Ser: 0.82 mg/dL (ref 0.44–1.00)
GFR calc Af Amer: >60 mL/min (ref >60)
GFR calc non Af Amer: >60 mL/min (ref >60)
Glucose, Bld: 164 mg/dL — ABNORMAL HIGH (ref 70–99)
Potassium: 4.1 mmol/L (ref 3.5–5.1)
Sodium: 139 mmol/L (ref 135–145)

## 2019-05-12 LAB — CBC
HCT: 25.7 % — ABNORMAL LOW (ref 36.0–46.0)
Hemoglobin: 7.7 g/dL — ABNORMAL LOW (ref 12.0–15.0)
MCH: 24.4 pg — ABNORMAL LOW (ref 26.0–34.0)
MCHC: 30 g/dL (ref 30.0–36.0)
MCV: 81.6 fL (ref 80.0–100.0)
Platelets: 356 10*3/uL (ref 150–400)
RBC: 3.15 MIL/uL — ABNORMAL LOW (ref 3.87–5.11)
RDW: 14.8 % (ref 11.5–15.5)
WBC: 11.2 10*3/uL — ABNORMAL HIGH (ref 4.0–10.5)
nRBC: 0 % (ref 0.0–0.2)

## 2019-05-12 LAB — RPR: RPR Ser Ql: NONREACTIVE

## 2019-05-12 MED ORDER — MOMETASONE FURO-FORMOTEROL FUM 200-5 MCG/ACT IN AERO
2.0000 | INHALATION_SPRAY | Freq: Two times a day (BID) | RESPIRATORY_TRACT | Status: DC
  Administered 2019-05-12 – 2019-05-15 (×6): 2 via RESPIRATORY_TRACT
  Filled 2019-05-12: qty 8.8

## 2019-05-12 MED ORDER — DIPHENHYDRAMINE HCL 25 MG PO CAPS
25.0000 mg | ORAL_CAPSULE | Freq: Four times a day (QID) | ORAL | Status: DC | PRN
  Administered 2019-05-12 – 2019-05-15 (×5): 25 mg via ORAL
  Filled 2019-05-12 (×6): qty 1

## 2019-05-12 MED ORDER — CITALOPRAM HYDROBROMIDE 20 MG PO TABS
20.0000 mg | ORAL_TABLET | Freq: Every day | ORAL | Status: DC
  Administered 2019-05-12 – 2019-05-15 (×4): 20 mg via ORAL
  Filled 2019-05-12 (×4): qty 1

## 2019-05-12 MED ORDER — ASPIRIN 81 MG PO TBEC: 81.0000 mg | DELAYED_RELEASE_TABLET | Freq: Every day | ORAL | Status: DC

## 2019-05-12 MED ORDER — LOSARTAN POTASSIUM 50 MG PO TABS
50.0000 mg | ORAL_TABLET | Freq: Every day | ORAL | Status: DC
  Administered 2019-05-12 – 2019-05-15 (×4): 50 mg via ORAL
  Filled 2019-05-12 (×4): qty 1

## 2019-05-12 MED ORDER — GABAPENTIN 300 MG PO CAPS
900.0000 mg | ORAL_CAPSULE | Freq: Two times a day (BID) | ORAL | Status: DC
  Administered 2019-05-12 – 2019-05-15 (×7): 900 mg via ORAL
  Filled 2019-05-12 (×7): qty 3

## 2019-05-12 MED ORDER — ALBUTEROL SULFATE (2.5 MG/3ML) 0.083% IN NEBU: 3.0000 mL | INHALATION_SOLUTION | Freq: Four times a day (QID) | RESPIRATORY_TRACT | Status: DC | PRN

## 2019-05-12 MED ORDER — NITROGLYCERIN 0.4 MG SL SUBL: 0.4000 mg | SUBLINGUAL_TABLET | SUBLINGUAL | Status: DC | PRN

## 2019-05-12 MED ORDER — ALENDRONATE SODIUM 70 MG PO TABS: 70.0000 mg | ORAL_TABLET | ORAL | Status: DC

## 2019-05-12 MED ORDER — METOPROLOL TARTRATE 25 MG PO TABS
25.0000 mg | ORAL_TABLET | Freq: Two times a day (BID) | ORAL | Status: DC
  Administered 2019-05-12 – 2019-05-15 (×7): 25 mg via ORAL
  Filled 2019-05-12 (×5): qty 1
  Filled 2019-05-12: qty 2
  Filled 2019-05-12: qty 1

## 2019-05-12 MED ORDER — NICOTINE 21 MG/24HR TD PT24
21.0000 mg | MEDICATED_PATCH | Freq: Every day | TRANSDERMAL | Status: DC
  Administered 2019-05-12 – 2019-05-15 (×4): 21 mg via TRANSDERMAL
  Filled 2019-05-12 (×4): qty 1

## 2019-05-12 MED ORDER — TIZANIDINE HCL 4 MG PO TABS
2.0000 mg | ORAL_TABLET | Freq: Two times a day (BID) | ORAL | Status: DC
  Administered 2019-05-12 – 2019-05-13 (×3): 2 mg via ORAL
  Filled 2019-05-12 (×3): qty 1

## 2019-05-12 MED ORDER — ALPRAZOLAM 0.25 MG PO TABS
0.2500 mg | ORAL_TABLET | Freq: Two times a day (BID) | ORAL | Status: DC
  Administered 2019-05-12 – 2019-05-15 (×7): 0.25 mg via ORAL
  Filled 2019-05-12 (×7): qty 1

## 2019-05-12 MED ORDER — DOCUSATE SODIUM 100 MG PO CAPS: 100.0000 mg | ORAL_CAPSULE | Freq: Two times a day (BID) | ORAL | Status: DC | PRN

## 2019-05-12 MED ORDER — METHOCARBAMOL 500 MG PO TABS
500.0000 mg | ORAL_TABLET | Freq: Three times a day (TID) | ORAL | Status: DC | PRN
  Administered 2019-05-12 – 2019-05-13 (×2): 500 mg via ORAL
  Filled 2019-05-12 (×3): qty 1

## 2019-05-12 MED ORDER — FERROUS SULFATE 325 (65 FE) MG PO TABS
325.0000 mg | ORAL_TABLET | Freq: Three times a day (TID) | ORAL | Status: DC
  Filled 2019-05-12 (×2): qty 1

## 2019-05-12 NOTE — Progress Notes (Signed)
Patient now calmer and feeling better.  Ready for transfer.

## 2019-05-12 NOTE — Progress Notes (Addendum)
   VASCULAR SURGERY ASSESSMENT & PLAN:   POD 1 - TEVAR/ RIGHT CIA CONDUIT: Patient is doing well and has no chest pain.  She has palpable pedal pulses.  RETROPERITONEAL EXPOSURE: She is not passed any flatus or moved her bowels however she is hungry and denies nausea.  We will start her on full liquids and advance as tolerated.  VASCULAR QUALITY INITIATIVE: She is on aspirin and a statin  DVT PROPHYLAXIS: She is on subcu heparin.  ACUTE BLOOD LOSS ANEMIA: Her hemoglobin is 7.7.  I suspect this is from where she bled into her thoracic aorta preoperatively.  She is asymptomatic.  She is not tachycardic.  I have ordered a follow-up hemoglobin for the morning.  I have ordered iron and Colace.  Transfer to 4 E.  Anticipate discharge tomorrow if she is able to ambulate and is tolerating a regular diet.  She will need a follow-up CT angiogram of the chest abdomen and pelvis in 1 month and a follow-up visit with me.    SUBJECTIVE:   No complaints this morning.  No BM or flatus.  She is hungry.  She denies nausea.  Her incisional pain is under good control.  PHYSICAL EXAM:   Vitals:   05/12/19 0400 05/12/19 0500 05/12/19 0600 05/12/19 0700  BP:    (!) 121/102  Pulse: 78 75 84 80  Resp: 14 16 15  (!) 24  Temp:      TempSrc:      SpO2: 97% 97% 100% 100%  Height:       Palpable pedal pulses. Her right flank incision looks fine. Lungs are clear.  LABS:   Lab Results  Component Value Date   WBC 11.2 (H) 05/12/2019   HGB 7.7 (L) 05/12/2019   HCT 25.7 (L) 05/12/2019   MCV 81.6 05/12/2019   PLT 356 05/12/2019   Lab Results  Component Value Date   CREATININE 0.82 05/12/2019    PROBLEM LIST:    Active Problems:   Thoracic aortic aneurysm, ruptured (HCC)   CURRENT MEDS:   . aspirin EC  81 mg Oral Q0600  . atorvastatin  10 mg Oral q1800  . Chlorhexidine Gluconate Cloth  6 each Topical Daily  . docusate sodium  100 mg Oral Daily  . mouth rinse  15 mL Mouth Rinse BID  .  nicotine  14 mg Transdermal Daily  . pantoprazole  40 mg Oral Daily  . sodium chloride flush  10-40 mL Intracatheter Q12H    05/14/2019 Office: [phone] 05/12/2019

## 2019-05-12 NOTE — Progress Notes (Signed)
PHARMACIST - PHYSICIAN COMMUNICATION  CONCERNING: P&T Medication Policy Regarding Oral Bisphosphonates  RECOMMENDATION: Your order for alendronate (Fosamax), ibandronate (Boniva), or risedronate (Actonel) has been discontinued at this time.  If the patient's post-hospital medical condition warrants safe use of this class of drugs, please resume the pre-hospital regimen upon discharge.  DESCRIPTION:  Alendronate (Fosamax), ibandronate (Boniva), and risedronate (Actonel) can cause severe esophageal erosions in patients who are unable to remain upright at least 30 minutes after taking this medication.   Since brief interruptions in therapy are thought to have minimal impact on bone mineral density, the Pharmacy & Therapeutics Committee has established that bisphosphonate orders should be routinely discontinued during hospitalization.   To override this safety policy and permit administration of Boniva, Fosamax, or Actonel in the hospital, prescribers must write "DO NOT HOLD" in the comments section when placing the order for this class of medications.  Thank you for involving pharmacy in this patient's care.  Loura Back, PharmD, BCPS Clinical Pharmacist Clinical phone for 05/12/2019 until 3p is [phone] 05/12/2019 12:30 PM  **Pharmacist phone directory can be found on [email or web address] listed under [facility]**

## 2019-05-12 NOTE — Progress Notes (Signed)
1630 Pt experiencing 10/10 incisional pain and muscle spasms. Pt tearful and very tense, holding abdomen. Morphine 2mg  and robaxan given. BP 170/75 (101). Abdomen assessment unremarkable- incision, clean, dry and intact. Pt instructed on nonpharmacologic pain management and given support.  1800 Pt experiencing less pain 5/10 after pain medication and relaxation. BP 103/56 (72). Will continue to monitor.  7/10, RN

## 2019-05-12 NOTE — Progress Notes (Signed)
Pt received from 2H. VSS. CHG complete. Pt oriented to room and unit. Call light in reach.  Daurice Ovando, RN  

## 2019-05-12 NOTE — Progress Notes (Addendum)
Ref: Ngetich, Donalee Citrin, NP   Subjective:  Increased pain post cough. Abdominal pain post procedure yesterday. Also c/o back spasms. Increased anxiety as she is off smoking. Mild tachycardia and systolic hypertension.  Objective:  Vital Signs in the last 24 hours: Temp:  [97.7 F (36.5 C)-98.2 F (36.8 C)] 97.7 F (36.5 C) (02/21 0750) Pulse Rate:  [75-93] 83 (02/21 0800) Cardiac Rhythm: Normal sinus rhythm (02/21 0800) Resp:  [13-25] 14 (02/21 0800) BP: (102-163)/(62-106) 137/62 (02/21 0800) SpO2:  [92 %-100 %] 96 % (02/21 0800) Arterial Line BP: (66-174)/(60-103) 66/60 (02/20 2045)  Physical Exam: BP Readings from Last 1 Encounters:  05/12/19 137/62     Wt Readings from Last 1 Encounters:  05/06/19 47.3 kg    Weight change:  Body mass index is 19.06 kg/m. HEENT: [place]/AT, Eyes-Blue, Conjunctiva-Pale, Sclera-Non-icteric Neck: No JVD, No bruit, Trachea midline. Lungs:  Clear, Bilateral. Cardiac:  Regular rhythm, normal S1 and S2, no S3. II/VI systolic murmur. Abdomen:  Soft, non-tender. BS present. Scar of surgery with tenderness around the scar. Extremities:  No edema present. No cyanosis. No clubbing. CNS: AxOx3, Cranial nerves grossly intact, moves all 4 extremities.  Skin: Warm and dry.   Intake/Output from previous day: 02/20 0701 - 02/21 0700 In: 2754.8 [P.O.:50; I.V.:2494.8; IV Piggyback:200] Out: 1340 [Urine:1290; Blood:50]    Lab Results: BMET    Component Value Date/Time   NA 139 05/12/2019 0426   NA 138 05/11/2019 1613   NA 139 05/11/2019 0431   K 4.1 05/12/2019 0426   K 4.2 05/11/2019 1613   K 4.0 05/11/2019 0431   CL 105 05/12/2019 0426   CL 107 05/11/2019 1613   CL 101 05/11/2019 0431   CO2 22 05/12/2019 0426   CO2 21 (L) 05/11/2019 1613   CO2 27 05/11/2019 0431   GLUCOSE 164 (H) 05/12/2019 0426   GLUCOSE 164 (H) 05/11/2019 1613   GLUCOSE 125 (H) 05/11/2019 0431   BUN 10 05/12/2019 0426   BUN 10 05/11/2019 1613   BUN 7 (L) 05/11/2019 0431   CREATININE 0.82 05/12/2019 0426   CREATININE 0.70 05/11/2019 1613   CREATININE 0.67 05/11/2019 0431   CREATININE 0.68 09/20/2017 1030   CREATININE 0.73 07/18/2017 1414   CREATININE 0.60 03/08/2017 1231   CALCIUM 8.1 (L) 05/12/2019 0426   CALCIUM 7.9 (L) 05/11/2019 1613   CALCIUM 9.0 05/11/2019 0431   GFRNONAA >60 05/12/2019 0426   GFRNONAA >60 05/11/2019 1613   GFRNONAA >60 05/11/2019 0431   GFRNONAA 96 09/20/2017 1030   GFRNONAA 100 03/08/2017 1231   GFRNONAA >90 01/18/2011 1053   GFRAA >60 05/12/2019 0426   GFRAA >60 05/11/2019 1613   GFRAA >60 05/11/2019 0431   GFRAA 111 09/20/2017 1030   GFRAA 116 03/08/2017 1231   GFRAA >90 01/18/2011 1053   CBC    Component Value Date/Time   WBC 11.2 (H) 05/12/2019 0426   RBC 3.15 (L) 05/12/2019 0426   HGB 7.7 (L) 05/12/2019 0426   HCT 25.7 (L) 05/12/2019 0426   PLT 356 05/12/2019 0426   MCV 81.6 05/12/2019 0426   MCH 24.4 (L) 05/12/2019 0426   MCHC 30.0 05/12/2019 0426   RDW 14.8 05/12/2019 0426   LYMPHSABS 2.3 05/11/2019 0431   MONOABS 0.8 05/11/2019 0431   EOSABS 0.2 05/11/2019 0431   BASOSABS 0.1 05/11/2019 0431   HEPATIC Function Panel Recent Labs    01/05/19 2318  PROT 5.2*   HEMOGLOBIN A1C No components found for: HGA1C,  MPG CARDIAC ENZYMES Lab  Results  Component Value Date   CKTOTAL 38 09/20/2017   TROPONINI <0.03 11/28/2016   TROPONINI <0.30 07/11/2013   TROPONINI <0.30 02/28/2013   BNP No results for input(s): PROBNP in the last 8760 hours. TSH No results for input(s): TSH in the last 8760 hours. CHOLESTEROL Recent Labs    01/06/19 0628  CHOL 127    Scheduled Meds: . ALPRAZolam  0.25 mg Oral BID  . aspirin EC  81 mg Oral Q0600  . atorvastatin  10 mg Oral q1800  . Chlorhexidine Gluconate Cloth  6 each Topical Daily  . docusate sodium  100 mg Oral Daily  . ferrous sulfate  325 mg Oral TID WC  . mouth rinse  15 mL Mouth Rinse BID  . metoprolol tartrate  25 mg Oral BID  . nicotine  21 mg  Transdermal Daily  . pantoprazole  40 mg Oral Daily  . sodium chloride flush  10-40 mL Intracatheter Q12H  . tiZANidine  2 mg Oral BID   Continuous Infusions: . sodium chloride    . sodium chloride Stopped (05/12/19 0646)  . magnesium sulfate bolus IVPB     PRN Meds:.sodium chloride, acetaminophen **OR** acetaminophen, alum & mag hydroxide-simeth, bisacodyl, diphenhydrAMINE, docusate sodium, guaiFENesin-dextromethorphan, hydrALAZINE, labetalol, magnesium sulfate bolus IVPB, metoprolol tartrate, morphine injection, ondansetron, oxyCODONE-acetaminophen, phenol, potassium chloride, sodium chloride flush  Assessment/Plan: Descending aortic aneurysm with penitrating ulcer and possible contained rupture S/P TEVAR/Right CIA conduit Acute blood loss anemia Hypertension Tobacco use disorder COPD Moderate protein calorie malnutrition Back pain Abdominal pain  Add Tizanidine, Xanax and analgesics. Add Metoprolol by mouth.   LOS: 1 day   Time spent including chart review, lab review, examination, discussion with patient and nurse : 30  min   Dixie Dials  MD  05/12/2019, 11:21 AM

## 2019-05-12 NOTE — Progress Notes (Signed)
Patient tearful and having pain and spasms.  Percocet given.  Dr. Algie Coffer here and ordered muscle relaxer and xanax for patient.  Patient given both per MD order.

## 2019-05-13 ENCOUNTER — Encounter: Payer: Self-pay | Admitting: *Deleted

## 2019-05-13 LAB — BASIC METABOLIC PANEL
Anion gap: 9 (ref 5–15)
BUN: 10 mg/dL (ref 8–23)
CO2: 23 mmol/L (ref 22–32)
Calcium: 8.4 mg/dL — ABNORMAL LOW (ref 8.9–10.3)
Chloride: 107 mmol/L (ref 98–111)
Creatinine, Ser: 0.7 mg/dL (ref 0.44–1.00)
GFR calc Af Amer: >60 mL/min (ref >60)
GFR calc non Af Amer: >60 mL/min (ref >60)
Glucose, Bld: 124 mg/dL — ABNORMAL HIGH (ref 70–99)
Potassium: 4.6 mmol/L (ref 3.5–5.1)
Sodium: 139 mmol/L (ref 135–145)

## 2019-05-13 LAB — CBC
HCT: 23.9 % — ABNORMAL LOW (ref 36.0–46.0)
Hemoglobin: 7.2 g/dL — ABNORMAL LOW (ref 12.0–15.0)
MCH: 24.6 pg — ABNORMAL LOW (ref 26.0–34.0)
MCHC: 30.1 g/dL (ref 30.0–36.0)
MCV: 81.6 fL (ref 80.0–100.0)
Platelets: 353 10*3/uL (ref 150–400)
RBC: 2.93 MIL/uL — ABNORMAL LOW (ref 3.87–5.11)
RDW: 14.8 % (ref 11.5–15.5)
WBC: 10.9 10*3/uL — ABNORMAL HIGH (ref 4.0–10.5)
nRBC: 0 % (ref 0.0–0.2)

## 2019-05-13 LAB — PREPARE RBC (CROSSMATCH)

## 2019-05-13 MED ORDER — SODIUM CHLORIDE 0.9 % IV BOLUS
250.0000 mL | Freq: Once | INTRAVENOUS | Status: AC
  Administered 2019-05-13: 250 mL via INTRAVENOUS

## 2019-05-13 MED ORDER — SODIUM CHLORIDE 0.9 % IV BOLUS
250.0000 mL | Freq: Once | INTRAVENOUS | Status: AC
  Administered 2019-05-13: 15:00:00 250 mL via INTRAVENOUS

## 2019-05-13 MED ORDER — SODIUM CHLORIDE 0.9% IV SOLUTION: Freq: Once | INTRAVENOUS | Status: AC

## 2019-05-13 MED ORDER — BISACODYL 10 MG RE SUPP: 10.0000 mg | Freq: Every day | RECTAL | Status: DC | PRN

## 2019-05-13 NOTE — Progress Notes (Addendum)
   VASCULAR SURGERY ASSESSMENT & PLAN:   POD 2 - TEVAR/ RIGHT CIA CONDUIT: Patient is doing well with palpable pedal pulses and no chest pain.  She is having some incisional pain.  She has required intravenous morphine.  She is also on muscle relaxant (Robaxin).  I think she will need another day to get her pain under control. I appreciate Dr. Roseanne [name] help with this patient as he is familiar with her.   RETROPERITONEAL EXPOSURE: She tolerated her diet yesterday.  We will advance to a regular diet.  She has been passing flatus but no BM yet.  We will give a Dulcolax.  VASCULAR QUALITY INITIATIVE: She is on aspirin and a statin  DVT PROPHYLAXIS: She is on subcu heparin.  ACUTE BLOOD LOSS ANEMIA: Her hemoglobin is 7.2 today.    Given that she is slightly tachycardic and her hemoglobin has dropped some I discussed transfusion with her today and have ordered 2 units.  I will stop her iron and Colace.  We will get a follow-up CBC tomorrow morning  Anticipate discharge tomorrow if her pain is under good control.  She will need a follow-up CT angiogram of the chest abdomen and pelvis in 1 month and a follow-up visit with me.    SUBJECTIVE:   Hungry.  Requesting regular food.  Her only complaint is incisional pain.  She has no chest pain.  PHYSICAL EXAM:   Vitals:   05/12/19 1949 05/12/19 2052 05/12/19 2302 05/13/19 0504  BP: 124/79  98/63 110/67  Pulse: 86  85 97  Resp: 19  15 (!) 21  Temp: 98.1 F (36.7 C)  98.3 F (36.8 C) 98 F (36.7 C)  TempSrc: Oral  Oral Oral  SpO2: 100% 98% 97% 97%  Height:       Palpable pedal pulses. Her incision looks fine. Abdomen with normal pitched bowel sounds. Lungs are clear.  LABS:   Lab Results  Component Value Date   WBC 10.9 (H) 05/13/2019   HGB 7.2 (L) 05/13/2019   HCT 23.9 (L) 05/13/2019   MCV 81.6 05/13/2019   PLT 353 05/13/2019   Lab Results  Component Value Date   CREATININE 0.70 05/13/2019   Lab Results  Component Value  Date   INR 1.3 (H) 05/11/2019    PROBLEM LIST:    Active Problems:   Thoracic aortic aneurysm, ruptured (HCC)   CURRENT MEDS:   . ALPRAZolam  0.25 mg Oral BID  . aspirin EC  81 mg Oral Q0600  . atorvastatin  10 mg Oral q1800  . Chlorhexidine Gluconate Cloth  6 each Topical Daily  . citalopram  20 mg Oral Daily  . docusate sodium  100 mg Oral Daily  . ferrous sulfate  325 mg Oral TID WC  . gabapentin  900 mg Oral BID  . losartan  50 mg Oral Daily  . mouth rinse  15 mL Mouth Rinse BID  . metoprolol tartrate  25 mg Oral BID  . mometasone-formoterol  2 puff Inhalation BID  . nicotine  21 mg Transdermal Daily  . pantoprazole  40 mg Oral Daily  . sodium chloride flush  10-40 mL Intracatheter Q12H  . tiZANidine  2 mg Oral BID    Brittany [name] Office: [phone] 05/13/2019

## 2019-05-13 NOTE — Progress Notes (Signed)
Ref: Ngetich, Dinah C, NP   Subjective:  Feeling better. Abdominal discomfort continues. Agrees to reduce smoking when asked to stop smoking.  VS stable with mild tachycardia from anemia of blood loss. She is getting blood transfusion.  Objective:  Vital Signs in the last 24 hours: Temp:  [97.5 F (36.4 C)-98.3 F (36.8 C)] 98.1 F (36.7 C) (02/22 1109) Pulse Rate:  [78-102] 101 (02/22 1109) Cardiac Rhythm: Normal sinus rhythm (02/22 0700) Resp:  [14-35] 19 (02/22 1109) BP: (98-170)/(54-95) 127/61 (02/22 1109) SpO2:  [94 %-100 %] 100 % (02/22 1109)  Physical Exam: BP Readings from Last 1 Encounters:  05/13/19 127/61     Wt Readings from Last 1 Encounters:  05/06/19 47.3 kg    Weight change:  Body mass index is 19.06 kg/m. HEENT: [place]/AT, Eyes-Blue, PERL, EOMI, Conjunctiva-Pale, Sclera-Non-icteric Neck: No JVD, No bruit, Trachea midline. Lungs:  Clear, Bilateral. Cardiac:  Regular rhythm, normal S1 and S2, no S3. II/VI systolic murmur. Abdomen:  Soft, Right sided scar area tender. BS present. Extremities:  No edema present. No cyanosis. No clubbing. CNS: AxOx3, Cranial nerves grossly intact, moves all 4 extremities.  Skin: Warm and dry.   Intake/Output from previous day: 02/21 0701 - 02/22 0700 In: 1020 [P.O.:1020] Out: 1500 [Urine:1500]    Lab Results: BMET    Component Value Date/Time   NA 139 05/13/2019 0105   NA 139 05/12/2019 0426   NA 138 05/11/2019 1613   K 4.6 05/13/2019 0105   K 4.1 05/12/2019 0426   K 4.2 05/11/2019 1613   CL 107 05/13/2019 0105   CL 105 05/12/2019 0426   CL 107 05/11/2019 1613   CO2 23 05/13/2019 0105   CO2 22 05/12/2019 0426   CO2 21 (L) 05/11/2019 1613   GLUCOSE 124 (H) 05/13/2019 0105   GLUCOSE 164 (H) 05/12/2019 0426   GLUCOSE 164 (H) 05/11/2019 1613   BUN 10 05/13/2019 0105   BUN 10 05/12/2019 0426   BUN 10 05/11/2019 1613   CREATININE 0.70 05/13/2019 0105   CREATININE 0.82 05/12/2019 0426   CREATININE 0.70 05/11/2019  1613   CREATININE 0.68 09/20/2017 1030   CREATININE 0.73 07/18/2017 1414   CREATININE 0.60 03/08/2017 1231   CALCIUM 8.4 (L) 05/13/2019 0105   CALCIUM 8.1 (L) 05/12/2019 0426   CALCIUM 7.9 (L) 05/11/2019 1613   GFRNONAA >60 05/13/2019 0105   GFRNONAA >60 05/12/2019 0426   GFRNONAA >60 05/11/2019 1613   GFRNONAA 96 09/20/2017 1030   GFRNONAA 100 03/08/2017 1231   GFRNONAA >90 01/18/2011 1053   GFRAA >60 05/13/2019 0105   GFRAA >60 05/12/2019 0426   GFRAA >60 05/11/2019 1613   GFRAA 111 09/20/2017 1030   GFRAA 116 03/08/2017 1231   GFRAA >90 01/18/2011 1053   CBC    Component Value Date/Time   WBC 10.9 (H) 05/13/2019 0105   RBC 2.93 (L) 05/13/2019 0105   HGB 7.2 (L) 05/13/2019 0105   HCT 23.9 (L) 05/13/2019 0105   PLT 353 05/13/2019 0105   MCV 81.6 05/13/2019 0105   MCH 24.6 (L) 05/13/2019 0105   MCHC 30.1 05/13/2019 0105   RDW 14.8 05/13/2019 0105   LYMPHSABS 2.3 05/11/2019 0431   MONOABS 0.8 05/11/2019 0431   EOSABS 0.2 05/11/2019 0431   BASOSABS 0.1 05/11/2019 0431   HEPATIC Function Panel Recent Labs    01/05/19 2318  PROT 5.2*   HEMOGLOBIN A1C No components found for: HGA1C,  MPG CARDIAC ENZYMES Lab Results  Component Value Date  CKTOTAL 38 09/20/2017   TROPONINI <0.03 11/28/2016   TROPONINI <0.30 07/11/2013   TROPONINI <0.30 02/28/2013   BNP No results for input(s): PROBNP in the last 8760 hours. TSH No results for input(s): TSH in the last 8760 hours. CHOLESTEROL Recent Labs    01/06/19 0628  CHOL 127    Scheduled Meds: . sodium chloride   Intravenous Once  . ALPRAZolam  0.25 mg Oral BID  . aspirin EC  81 mg Oral Q0600  . atorvastatin  10 mg Oral q1800  . Chlorhexidine Gluconate Cloth  6 each Topical Daily  . citalopram  20 mg Oral Daily  . gabapentin  900 mg Oral BID  . losartan  50 mg Oral Daily  . mouth rinse  15 mL Mouth Rinse BID  . metoprolol tartrate  25 mg Oral BID  . mometasone-formoterol  2 puff Inhalation BID  . nicotine  21  mg Transdermal Daily  . pantoprazole  40 mg Oral Daily  . sodium chloride flush  10-40 mL Intracatheter Q12H  . tiZANidine  2 mg Oral BID   Continuous Infusions: . sodium chloride    . sodium chloride Stopped (05/12/19 0646)  . magnesium sulfate bolus IVPB     PRN Meds:.sodium chloride, acetaminophen **OR** acetaminophen, albuterol, alum & mag hydroxide-simeth, bisacodyl, diphenhydrAMINE, docusate sodium, guaiFENesin-dextromethorphan, hydrALAZINE, labetalol, magnesium sulfate bolus IVPB, methocarbamol, metoprolol tartrate, morphine injection, nitroGLYCERIN, ondansetron, oxyCODONE-acetaminophen, phenol, potassium chloride, sodium chloride flush  Assessment/Plan: Descending aortic anerysm with penetrating ulcer and possible contained rupture with hematoma S/P TEVAR via right CIA conduit Acute blood loss anemia Tobacco use disorder COPD Moderate protein calorie malnutrition Back pain Abdominal pain  Add diltiazem for heart rate control. Increase activity. DME-bedside commode and quad cane.   LOS: 2 days   Time spent including chart review, lab review, examination, discussion with patient and nurse : 30 min   Dixie Dials  MD  05/13/2019, 11:13 AM

## 2019-05-13 NOTE — Plan of Care (Signed)
Poc progressing.  

## 2019-05-13 NOTE — Progress Notes (Signed)
Pt's BP 81/56 (65). Pt drowsy and complaining of dizziness. MD notified and 250cc NS bolus ordered. Pt complained of chest pain 10 out 10 that felt like someone was standing on her chest. EKG done. MD arrived to bedside to evaluate pt. BP up to 94/63 (73). MD ordered additional 250cc NS bolus and adjusted pt's meds. EKG reviewed by MD. Pt to get 2nd unit of blood after bolus is complete.

## 2019-05-14 LAB — CBC
HCT: 34.4 % — ABNORMAL LOW (ref 36.0–46.0)
Hemoglobin: 11.2 g/dL — ABNORMAL LOW (ref 12.0–15.0)
MCH: 26 pg (ref 26.0–34.0)
MCHC: 32.6 g/dL (ref 30.0–36.0)
MCV: 79.8 fL — ABNORMAL LOW (ref 80.0–100.0)
Platelets: 395 10*3/uL (ref 150–400)
RBC: 4.31 MIL/uL (ref 3.87–5.11)
RDW: 15.3 % (ref 11.5–15.5)
WBC: 10 10*3/uL (ref 4.0–10.5)
nRBC: 0 % (ref 0.0–0.2)

## 2019-05-14 LAB — TYPE AND SCREEN
ABO/RH(D): A POS
Antibody Screen: NEGATIVE
Unit division: 0
Unit division: 0

## 2019-05-14 LAB — BPAM RBC
Blood Product Expiration Date: 202103032359
Blood Product Expiration Date: 202103032359
ISSUE DATE / TIME: 202102221043
ISSUE DATE / TIME: 202102221630
Unit Type and Rh: 6200
Unit Type and Rh: 6200

## 2019-05-14 LAB — POCT ACTIVATED CLOTTING TIME
Activated Clotting Time: 153 seconds
Activated Clotting Time: 186 seconds
Activated Clotting Time: 224 seconds

## 2019-05-14 NOTE — Progress Notes (Signed)
   VASCULAR SURGERY ASSESSMENT & PLAN:   POD 3 - TEVAR/ RIGHT CIA CONDUIT:Patient is doing well with palpable pedal pulses and no chest pain.  She is having some incisional pain.  Her pain has been controlled with p.o. pain medication.  She is also on Robaxin.  RETROPERITONEAL EXPOSURE:  She is tolerating a regular diet.  She is passing flatus but has not moved her bowels.  She had a duplex yesterday.  We will wait to be sure she can move her bowels before discharge.  VASCULAR QUALITY INITIATIVE: She is on aspirin and a statin  DVT PROPHYLAXIS: She is on subcu heparin.  ACUTE BLOOD LOSS ANEMIA: She received 2 units of blood yesterday.  She had anemia likely secondary to her bleed in her thoracic aorta.  Her hemoglobin today is 11.2.  She is no longer tachycardic.  Anticipate discharge later today if she is able to ambulate and is able to move her bowels.  She will need a follow-up CT angiogram of the chest abdomen and pelvis in 1 month and a follow-up visit with me.    SUBJECTIVE:   She complains of incisional pain.  She has not moved her bowels.  She is passing flatus.  She is tolerating a regular diet.  PHYSICAL EXAM:   Vitals:   05/13/19 2145 05/13/19 2354 05/14/19 0250 05/14/19 0418  BP: (!) 145/81 (!) 154/81 (!) 144/80 128/78  Pulse: 91 93 83 85  Resp: 20 15 15  (!) 22  Temp:  98.1 F (36.7 C) 98 F (36.7 C) 97.6 F (36.4 C)  TempSrc:  Oral Oral Oral  SpO2: 98% 96% 99% 99%  Height:       Her incision looks fine without erythema or drainage. She has normal pitched bowel sounds. Lungs are clear. She has palpable pedal pulses.  LABS:   Lab Results  Component Value Date   WBC 10.0 05/14/2019   HGB 11.2 (L) 05/14/2019   HCT 34.4 (L) 05/14/2019   MCV 79.8 (L) 05/14/2019   PLT 395 05/14/2019   Lab Results  Component Value Date   CREATININE 0.70 05/13/2019   Lab Results  Component Value Date   INR 1.3 (H) 05/11/2019    PROBLEM LIST:    Active  Problems:   Thoracic aortic aneurysm, ruptured (HCC)  CURRENT MEDS:   . ALPRAZolam  0.25 mg Oral BID  . aspirin EC  81 mg Oral Q0600  . atorvastatin  10 mg Oral q1800  . Chlorhexidine Gluconate Cloth  6 each Topical Daily  . citalopram  20 mg Oral Daily  . gabapentin  900 mg Oral BID  . losartan  50 mg Oral Daily  . mouth rinse  15 mL Mouth Rinse BID  . metoprolol tartrate  25 mg Oral BID  . mometasone-formoterol  2 puff Inhalation BID  . nicotine  21 mg Transdermal Daily  . pantoprazole  40 mg Oral Daily  . sodium chloride flush  10-40 mL Intracatheter Q12H   05/13/2019 Office: [phone] 05/14/2019

## 2019-05-14 NOTE — Discharge Instructions (Signed)
  [facility]   Discharge Instructions  Endovascular Aortic Aneurysm Repair  Please refer to the following instructions for your post-procedure care. Your surgeon or Physician Assistant will discuss any changes with you.  Activity  You are encouraged to walk as much as you can. You can slowly return to normal activities but must avoid strenuous activity and heavy lifting until your doctor tells you it's OK. Avoid activities such as vacuuming or swinging a gold club. It is normal to feel tired for several weeks after your surgery. Do not drive until your doctor gives the OK and you are no longer taking prescription pain medications. It is also normal to have difficulty with sleep habits, eating, and bowel movements after surgery. These will go away with time.  Bathing/Showering  Shower daily after you go home.  Do not soak in a bathtub, hot tub, or swim until the incision heals completely.  If you have incisions in your groin, wash the groin wounds with soap and water daily and pat dry. (No tub bath-only shower)  Then put a dry gauze or washcloth there to keep this area dry to help prevent wound infection daily and as needed.  Do not use Vaseline or neosporin on your incisions.  Only use soap and water on your incisions and then protect and keep dry.  Incision Care  Shower every day. Clean your incision with mild soap and water. Pat the area dry with a clean towel. You do not need a bandage unless otherwise instructed. Do not apply any ointments or creams to your incision. If you clothing is irritating, you may cover your incision with a dry gauze pad.  Diet  Resume your normal diet. There are no special food restrictions following this procedure. A low fat/low cholesterol diet is recommended for all patients with vascular disease. In order to heal from your surgery, it is CRITICAL to get adequate nutrition. Your body requires vitamins, minerals, and protein.  Vegetables are the best source of vitamins and minerals. Vegetables also provide the perfect balance of protein. Processed food has little nutritional value, so try to avoid this.  Medications  Resume taking all of your medications unless your doctor or nurse practitioner tells you not to. If your incision is causing pain, you may take over-the-counter pain relievers such as acetaminophen (Tylenol). If you were prescribed a stronger pain medication, please be aware these medications can cause nausea and constipation. Prevent nausea by taking the medication with a snack or meal. Avoid constipation by drinking plenty of fluids and eating foods with a high amount of fiber, such as fruits, vegetables, and grains.  Do not take Tylenol if you are taking prescription pain medications.   Follow up  Our office will schedule a follow-up appointment with a CT scan 3-4 weeks after your surgery.  Please call us immediately for any of the following conditions  Severe or worsening pain in your legs or feet or in your abdomen back or chest. Increased pain, redness, drainage (pus) from your incision site. Increased abdominal pain, bloating, nausea, vomiting or persistent diarrhea. Fever of 101 degrees or higher. Swelling in your leg (s),  Reduce your risk of vascular disease  Stop smoking. If you would like help call [facility] at 1-800-QUIT-NOW (1-800-784-8669) or [place] at [phone]. Manage your cholesterol Maintain a desired weight Control your diabetes Keep your blood pressure down  If you have questions, please call the office at [phone].  

## 2019-05-14 NOTE — Progress Notes (Signed)
Pt ambulated 300 ft in hallway using rolling walker. Pt tolerated well. Pt reported last bowel movement on 2/18. Pt refusing PRN dulcolax suppository and colace. Pt has active bowel sounds.

## 2019-05-15 ENCOUNTER — Other Ambulatory Visit: Payer: Self-pay

## 2019-05-15 DIAGNOSIS — I712 Thoracic aortic aneurysm, without rupture, unspecified: Secondary | ICD-10-CM

## 2019-05-15 LAB — POCT ACTIVATED CLOTTING TIME
Activated Clotting Time: 219 seconds
Activated Clotting Time: 186 seconds

## 2019-05-15 MED ORDER — GUAIFENESIN-DM 100-10 MG/5ML PO SYRP: 15.0000 mL | ORAL_SOLUTION | ORAL | 0 refills | Status: DC | PRN

## 2019-05-15 MED ORDER — ATORVASTATIN CALCIUM 10 MG PO TABS: 10.0000 mg | ORAL_TABLET | Freq: Every day | ORAL | 3 refills | Status: DC

## 2019-05-15 MED ORDER — NICOTINE 21 MG/24HR TD PT24: 21.0000 mg | MEDICATED_PATCH | Freq: Every day | TRANSDERMAL | 0 refills | Status: DC

## 2019-05-15 MED ORDER — POTASSIUM CHLORIDE CRYS ER 10 MEQ PO TBCR: 10.0000 meq | EXTENDED_RELEASE_TABLET | Freq: Every day | ORAL | 3 refills | Status: DC

## 2019-05-15 MED ORDER — METOPROLOL TARTRATE 25 MG PO TABS: 25.0000 mg | ORAL_TABLET | Freq: Two times a day (BID) | ORAL | 3 refills | Status: DC

## 2019-05-15 MED ORDER — DOCUSATE SODIUM 100 MG PO CAPS: 100.0000 mg | ORAL_CAPSULE | Freq: Every day | ORAL | 1 refills | Status: DC

## 2019-05-15 MED ORDER — PANTOPRAZOLE SODIUM 40 MG PO TBEC: 40.0000 mg | DELAYED_RELEASE_TABLET | Freq: Every day | ORAL | 1 refills | Status: DC

## 2019-05-15 MED ORDER — HYDROCODONE-ACETAMINOPHEN 5-325 MG PO TABS: 1.0000 | ORAL_TABLET | ORAL | 0 refills | Status: DC | PRN

## 2019-05-15 NOTE — Progress Notes (Signed)
Order received to discharge patient.  Telemetry monitor removed and CCMD notified.  PIV access removed.  Discharge instructions, follow up, medications and instructions for their use discussed with patient. 

## 2019-05-15 NOTE — Care Management Important Message (Signed)
Important Message  Patient Details  Name: Brittany [name] MRN: [id number] Date of Birth: [birth year]   Medicare Important Message Given:  Yes     Renie Ora 05/15/2019, 10:45 AM

## 2019-05-15 NOTE — Discharge Summary (Signed)
Physician Discharge Summary  Patient ID: Brittany [name] MRN: [id number] DOB/AGE: 1955-12-30 64 y.o.  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 Hypertension Chronic anxiety Anemia Moderate protein calori malnutrition Mild hyperglycemia  Discharge Diagnoses:  Principle Problem: Symptomatic thoracic aortic aneurysm with rupture and hematoma Active Problems: S/P TEVAR via right CIA conduit Acute blood loss anemia Tobacco use disorder COPD CAD Moderate protein calorie malnutrition Back pain Neck pain Abdominal pain Chronic anxiety and depression  Discharged Condition: fair  Hospital Course: 64 years old female with PMH of hypertension, stroke, depression, COPD and alzheimer disease had back pain x 1 week not responding to oral medications and came to ER for evaluation. CT angio chest showed 5.5x 5 cm proximal descending aortic aneurysm with ulcer and hematoma with possible contained rupture.  Emergent Vascular surgery was done by Dr. Jaynie Collins involving TEVAR via right ICA conduit. Patient required 2 units of PRBC for blood loss anemia. She needed antianxiety and analgesic.  Gradually she was able to ambulate and was discharged home in stable condition with f/u by me in 1 week and by vascular surgery in 1 month.  She agrees to use nicotine patch and quit smoking or significantly reduce smoking.   Consults: cardiology and vascular surgery  Significant Diagnostic Studies: labs: Hgb 9.5 g on admission, down to 7.2 G post surgery and 11.2 post 2 units PRBC. BMET near normal with mild hyperglycemia. Lipid panel and Hgb A1C were unremarkable 4 months ago.   CXR: No active disease.  CT angio chest/abd/pelvis : Thick walled thoracic descending aortic aneurysm, 5.5 x 5 cm, 3 V CAD, aortic atherosclerosis and moderate sigmoid diverticulosis.  Treatments: surgery:  TEVAR via right CIA conduit.   Discharge Exam: Blood pressure (!) 142/86, pulse 74, temperature 97.9 F (36.6 C), temperature source Oral, resp. rate 18, height 5\' 2"  (1.575 m), weight 47.3 kg, SpO2 96 %. General appearance: alert, cooperative and appears stated age. Head: Normocephalic, atraumatic. Eyes: Blue eyes, pink conjunctiva, corneas clear. PERRL, EOM's intact.  Neck: No adenopathy, no carotid bruit, no JVD, supple, symmetrical, trachea midline and thyroid not enlarged. Resp: Clear to auscultation bilaterally. Cardio: Regular rate and rhythm, S1, S2 normal, II/VI systolic murmur, no click, rub or gallop. GI: Soft, scar of surgery tender; bowel sounds normal; no organomegaly. Extremities: No edema, cyanosis or clubbing. Skin: Warm and dry.  Neurologic: Alert and oriented X 3, normal strength and tone. Normal coordination and slow gait.  Disposition: Discharge disposition: 01-Home or Self Care        Allergies as of 05/15/2019      Reactions   Amoxicillin Hives, Rash   Has patient had a PCN reaction causing immediate rash, facial/tongue/throat swelling, SOB or lightheadedness with hypotension: Yes, hives Has patient had a PCN reaction causing severe rash involving mucus membranes or skin necrosis: No Has patient had a PCN reaction that required hospitalization No Has patient had a PCN reaction occurring within the last 10 years: Yes If all of the above answers are "NO", then may proceed with Cephalosporin use.   Clindamycin/lincomycin Diarrhea, Other (See Comments)   Patient states that clindamycin causes diarrhea    Ropinirole Other (See Comments)   "Made me insane" (per the patient)   Wellbutrin [bupropion] Diarrhea, Other (See Comments)   Lightheadedness, also   Sulfamethoxazole Nausea And Vomiting, Rash   Sulfonamide Derivatives Nausea And Vomiting, Rash      Medication List  STOP taking these medications   clopidogrel 75 MG tablet Commonly known as: PLAVIX    methocarbamol 500 MG tablet Commonly known as: Robaxin     TAKE these medications   albuterol 108 (90 Base) MCG/ACT inhaler Commonly known as: VENTOLIN HFA Inhale 2 puffs into the lungs every 6 (six) hours as needed for wheezing or shortness of breath.   alendronate 70 MG tablet Commonly known as: FOSAMAX Take with a full glass of water on an empty stomach. What changed:   how much to take  how to take this  when to take this  additional instructions   aspirin 81 MG EC tablet Take 1 tablet (81 mg total) by mouth daily.   atorvastatin 10 MG tablet Commonly known as: LIPITOR Take 1 tablet (10 mg total) by mouth daily at 6 PM. What changed:   medication strength  how much to take   budesonide-formoterol 160-4.5 MCG/ACT inhaler Commonly known as: SYMBICORT Inhale 2 puffs into the lungs 2 (two) times daily as needed (for flares).   citalopram 40 MG tablet Commonly known as: CELEXA Take 0.5 tablets (20 mg total) by mouth daily for 30 days.   docusate sodium 100 MG capsule Commonly known as: COLACE Take 1 capsule (100 mg total) by mouth daily.   gabapentin 300 MG capsule Commonly known as: NEURONTIN Take 2 capsules (600 mg total) by mouth 2 (two) times daily. Take 600 mg by mouth in the morning and 600 mg at bedtime What changed:   how much to take  when to take this  additional instructions   guaiFENesin-dextromethorphan 100-10 MG/5ML syrup Commonly known as: ROBITUSSIN DM Take 15 mLs by mouth every 4 (four) hours as needed for cough.   losartan 50 MG tablet Commonly known as: COZAAR Take 1 tablet (50 mg total) daily by mouth.   metoprolol tartrate 25 MG tablet Commonly known as: LOPRESSOR Take 1 tablet (25 mg total) by mouth 2 (two) times daily.   nicotine 21 mg/24hr patch Commonly known as: NICODERM CQ - dosed in mg/24 hours Place 1 patch (21 mg total) onto the skin daily. Start taking on: May 16, 2019   nitroGLYCERIN 0.4 MG SL  tablet Commonly known as: NITROSTAT Place 1 tablet (0.4 mg total) under the tongue every 5 (five) minutes x 3 doses as needed for chest pain (chest pain).   pantoprazole 40 MG tablet Commonly known as: PROTONIX Take 1 tablet (40 mg total) by mouth daily. Start taking on: May 16, 2019   potassium chloride 10 MEQ tablet Commonly known as: KLOR-CON Take 1 tablet (10 mEq total) by mouth daily.            Durable Medical Equipment  (From admission, onward)         Start     Ordered   05/14/19 0943  For home use only DME Walker rolling  Once    Question Answer Comment  Walker: With [employer]   Patient needs a walker to treat with the following condition Weakness      05/14/19 0942   05/13/19 1124  For home use only DME Bedside commode  Once    Question:  Patient needs a bedside commode to treat with the following condition  Answer:  Weakness of both legs   05/13/19 1124   05/13/19 1123  For home use only DME Cane  Once    Comments: Quad cane for weakness and gait abnormality   05/13/19 1123  Follow-up Information    [facility] Follow up.   Why: BSC, and quad cane arranged- to be delivered to room prior to discharge       Chuck Hint, MD In 4 weeks.   Specialties: Vascular Surgery, Cardiology Why: Office will call you to arrange your appt (sent) Contact information: [address] [place] [address] [phone]        Orpah Cobb, MD. Schedule an appointment as soon as possible for a visit in 1 week(s).   Specialty: Cardiology Contact information: [address] [place] [address] [phone]           Time spent: Review of old chart, current chart, lab, x-ray, cardiac tests and discussion with patient over 60 minutes.  Signed: Ricki Rodriguez 05/15/2019, 10:17 AM

## 2019-05-15 NOTE — Progress Notes (Signed)
Ref: Ngetich, Dinah C, NP   Subjective:  Abdominal discomfort continues. Tachycardia improves post blood transfusion. Afebrile. VS stable.   Objective:  Vital Signs in the last 24 hours: Temp:  [97.9 F (36.6 C)-98.4 F (36.9 C)] 97.9 F (36.6 C) (02/24 0810) Pulse Rate:  [74-91] 74 (02/24 0810) Cardiac Rhythm: Normal sinus rhythm (02/24 0810) Resp:  [15-20] 18 (02/24 0810) BP: (118-150)/(65-86) 142/86 (02/24 0810) SpO2:  [94 %-99 %] 96 % (02/24 0810) Weight:  [47.3 kg] 47.3 kg (02/24 0745)  Physical Exam: BP Readings from Last 1 Encounters:  05/15/19 (!) 142/86     Wt Readings from Last 1 Encounters:  05/15/19 47.3 kg    Weight change:  Body mass index is 19.06 kg/m. HEENT: [place]/AT, Eyes-Blue, PERL, EOMI, Conjunctiva-Pink, Sclera-Non-icteric Neck: No JVD, No bruit, Trachea midline. Lungs:  Clear, Bilateral. Cardiac:  Regular rhythm, normal S1 and S2, no S3. II/VI systolic murmur. Abdomen:  Soft, Right sided lower abdominal surgical wound tender on mild palpation. BS present. Extremities:  No edema present. No cyanosis. No clubbing. CNS: AxOx3, Cranial nerves grossly intact, moves all 4 extremities.  Skin: Warm and dry.   Intake/Output from previous day: 02/23 0701 - 02/24 0700 In: 10 [I.V.:10] Out: -     Lab Results: BMET    Component Value Date/Time   NA 139 05/13/2019 0105   NA 139 05/12/2019 0426   NA 138 05/11/2019 1613   K 4.6 05/13/2019 0105   K 4.1 05/12/2019 0426   K 4.2 05/11/2019 1613   CL 107 05/13/2019 0105   CL 105 05/12/2019 0426   CL 107 05/11/2019 1613   CO2 23 05/13/2019 0105   CO2 22 05/12/2019 0426   CO2 21 (L) 05/11/2019 1613   GLUCOSE 124 (H) 05/13/2019 0105   GLUCOSE 164 (H) 05/12/2019 0426   GLUCOSE 164 (H) 05/11/2019 1613   BUN 10 05/13/2019 0105   BUN 10 05/12/2019 0426   BUN 10 05/11/2019 1613   CREATININE 0.70 05/13/2019 0105   CREATININE 0.82 05/12/2019 0426   CREATININE 0.70 05/11/2019 1613   CREATININE 0.68 09/20/2017  1030   CREATININE 0.73 07/18/2017 1414   CREATININE 0.60 03/08/2017 1231   CALCIUM 8.4 (L) 05/13/2019 0105   CALCIUM 8.1 (L) 05/12/2019 0426   CALCIUM 7.9 (L) 05/11/2019 1613   GFRNONAA >60 05/13/2019 0105   GFRNONAA >60 05/12/2019 0426   GFRNONAA >60 05/11/2019 1613   GFRNONAA 96 09/20/2017 1030   GFRNONAA 100 03/08/2017 1231   GFRNONAA >90 01/18/2011 1053   GFRAA >60 05/13/2019 0105   GFRAA >60 05/12/2019 0426   GFRAA >60 05/11/2019 1613   GFRAA 111 09/20/2017 1030   GFRAA 116 03/08/2017 1231   GFRAA >90 01/18/2011 1053   CBC    Component Value Date/Time   WBC 10.0 05/14/2019 0247   RBC 4.31 05/14/2019 0247   HGB 11.2 (L) 05/14/2019 0247   HCT 34.4 (L) 05/14/2019 0247   PLT 395 05/14/2019 0247   MCV 79.8 (L) 05/14/2019 0247   MCH 26.0 05/14/2019 0247   MCHC 32.6 05/14/2019 0247   RDW 15.3 05/14/2019 0247   LYMPHSABS 2.3 05/11/2019 0431   MONOABS 0.8 05/11/2019 0431   EOSABS 0.2 05/11/2019 0431   BASOSABS 0.1 05/11/2019 0431   HEPATIC Function Panel Recent Labs    01/05/19 2318  PROT 5.2*   HEMOGLOBIN A1C No components found for: HGA1C,  MPG CARDIAC ENZYMES Lab Results  Component Value Date   CKTOTAL 38 09/20/2017   TROPONINI <  0.03 11/28/2016   TROPONINI <0.30 07/11/2013   TROPONINI <0.30 02/28/2013   BNP No results for input(s): PROBNP in the last 8760 hours. TSH No results for input(s): TSH in the last 8760 hours. CHOLESTEROL Recent Labs    01/06/19 0628  CHOL 127    Scheduled Meds:  ALPRAZolam  0.25 mg Oral BID   aspirin EC  81 mg Oral Q0600   atorvastatin  10 mg Oral q1800   Chlorhexidine Gluconate Cloth  6 each Topical Daily   citalopram  20 mg Oral Daily   gabapentin  900 mg Oral BID   losartan  50 mg Oral Daily   mouth rinse  15 mL Mouth Rinse BID   metoprolol tartrate  25 mg Oral BID   mometasone-formoterol  2 puff Inhalation BID   nicotine  21 mg Transdermal Daily   pantoprazole  40 mg Oral Daily   sodium chloride flush  10-40 mL  Intracatheter Q12H   Continuous Infusions:  sodium chloride     sodium chloride Stopped (05/12/19 0646)   magnesium sulfate bolus IVPB     PRN Meds:.sodium chloride, acetaminophen **OR** acetaminophen, albuterol, alum & mag hydroxide-simeth, bisacodyl, diphenhydrAMINE, docusate sodium, guaiFENesin-dextromethorphan, hydrALAZINE, labetalol, magnesium sulfate bolus IVPB, metoprolol tartrate, ondansetron, oxyCODONE-acetaminophen, phenol, potassium chloride, sodium chloride flush  Assessment/Plan: Proximal thoracic aortic aneurysm with dissection and hematoma S/P TEVAR via right CIA conduit COPD Tobacco use disorder Back pain Neck pain Abdominal pain Moderate protein calorie malnutrition  Increase activity. Home soon. Reminded patient to continue nicotine patch and continue smoking cessation.   LOS: 3 days   Time spent including chart review, lab review, examination, discussion with patient and nurse : 30 min   Dixie Dials  MD  05/15/2019, 11:16 AM

## 2019-05-15 NOTE — Progress Notes (Signed)
   VASCULAR SURGERY ASSESSMENT & PLAN:   POD4- TEVAR/ RIGHT CIA CONDUIT:Patient is doing well with palpable pedal pulses and no chest pain.  Doing well.  Home today.  RETROPERITONEAL EXPOSURE: She is tolerating a regular diet. She has moved her bowels.  VASCULAR QUALITY INITIATIVE: She is on aspirin and a statin  ACUTE BLOOD LOSS ANEMIA: Resolved  She will need a follow-up CT angiogram of the chest abdomen and pelvis in 1 month and a follow-up visit with me.    SUBJECTIVE:   Tolerating a regular diet.  Moving her bowels.  Her pain is well controlled on p.o. pain medication  PHYSICAL EXAM:   Vitals:   05/14/19 1935 05/14/19 2033 05/14/19 2349 05/15/19 0415  BP: 128/72  129/73 (!) 150/84  Pulse: 91  85 82  Resp: 15  19 18   Temp: 97.9 F (36.6 C)  98 F (36.7 C) 98.3 F (36.8 C)  TempSrc: Oral  Oral Oral  SpO2: 99% 99% 94% 94%  Height:       Lungs clear. Abdomen with normal pitched bowel sounds. Palpable pedal pulses. Incision looks fine.  LABS:   Lab Results  Component Value Date   WBC 10.0 05/14/2019   HGB 11.2 (L) 05/14/2019   HCT 34.4 (L) 05/14/2019   MCV 79.8 (L) 05/14/2019   PLT 395 05/14/2019    PROBLEM LIST:    Active Problems:   Thoracic aortic aneurysm, ruptured (HCC)   CURRENT MEDS:   . ALPRAZolam  0.25 mg Oral BID  . aspirin EC  81 mg Oral Q0600  . atorvastatin  10 mg Oral q1800  . Chlorhexidine Gluconate Cloth  6 each Topical Daily  . citalopram  20 mg Oral Daily  . gabapentin  900 mg Oral BID  . losartan  50 mg Oral Daily  . mouth rinse  15 mL Mouth Rinse BID  . metoprolol tartrate  25 mg Oral BID  . mometasone-formoterol  2 puff Inhalation BID  . nicotine  21 mg Transdermal Daily  . pantoprazole  40 mg Oral Daily  . sodium chloride flush  10-40 mL Intracatheter Q12H    05/16/2019 Office: [phone] 05/15/2019

## 2019-05-16 LAB — CULTURE, BLOOD (ROUTINE X 2)
Culture: NO GROWTH
Culture: NO GROWTH

## 2019-05-17 ENCOUNTER — Telehealth: Payer: Self-pay | Admitting: *Deleted

## 2019-05-17 NOTE — Telephone Encounter (Signed)
Patient called back and stated that she does not need to make an appointment with [place] for a Hospital Follow up. Stated that she is following up with her Cardiologist and Surgeon and doesn't think it is necessary for her to come here also. Patient refused TOC Appointment.

## 2019-05-17 NOTE — Telephone Encounter (Signed)
I have made the 1st attempt to contact the patient or family member in charge, in order to follow up from recently being discharged from the hospital. I left a message on voicemail but I will make another attempt at a different time.  

## 2019-05-20 ENCOUNTER — Encounter: Payer: Medicare HMO | Admitting: Family

## 2019-05-20 NOTE — Progress Notes (Signed)
Patient ID: Brittany [name], female   DOB: Dec 05, 1955, 64 y.o.   MRN: [id number] This service is provided via telemedicine  No vital signs collected/recorded due to the encounter was a telemedicine visit.   Location of patient (ex: home, work):  HOME  Patient consents to a telephone visit:  YES  Location of the provider (ex: office, home):  OFFICE  Name of any referring provider:  [facility] Colen Eltzroth, NP  Names of all persons participating in the telemedicine service and their role in the encounter:  PATIENT, [name], CMA, [facility], NP  Time spent on call:   This encounter was created in error - please disregard.Patient did not return LVM or answer her phone.

## 2019-05-28 ENCOUNTER — Inpatient Hospital Stay (HOSPITAL_COMMUNITY)
Admission: EM | Admit: 2019-05-28 | Discharge: 2019-06-02 | DRG: 313 | Disposition: A | Discharge status: Home / Self Care | Payer: Medicare HMO | Attending: Cardiology | Admitting: Cardiology

## 2019-05-28 ENCOUNTER — Emergency Department (HOSPITAL_COMMUNITY): Payer: Medicare HMO

## 2019-05-28 DIAGNOSIS — T465X5A Adverse effect of other antihypertensive drugs, initial encounter: Secondary | ICD-10-CM | POA: Diagnosis not present

## 2019-05-28 DIAGNOSIS — I2510 Atherosclerotic heart disease of native coronary artery without angina pectoris: Secondary | ICD-10-CM | POA: Diagnosis present

## 2019-05-28 DIAGNOSIS — T8089XA Other complications following infusion, transfusion and therapeutic injection, initial encounter: Secondary | ICD-10-CM | POA: Diagnosis not present

## 2019-05-28 DIAGNOSIS — R0789 Other chest pain: Secondary | ICD-10-CM | POA: Diagnosis present

## 2019-05-28 DIAGNOSIS — Z823 Family history of stroke: Secondary | ICD-10-CM

## 2019-05-28 DIAGNOSIS — J449 Chronic obstructive pulmonary disease, unspecified: Secondary | ICD-10-CM | POA: Diagnosis present

## 2019-05-28 DIAGNOSIS — I249 Acute ischemic heart disease, unspecified: Secondary | ICD-10-CM | POA: Diagnosis present

## 2019-05-28 DIAGNOSIS — Z8679 Personal history of other diseases of the circulatory system: Secondary | ICD-10-CM

## 2019-05-28 DIAGNOSIS — E440 Moderate protein-calorie malnutrition: Secondary | ICD-10-CM | POA: Diagnosis present

## 2019-05-28 DIAGNOSIS — T447X5A Adverse effect of beta-adrenoreceptor antagonists, initial encounter: Secondary | ICD-10-CM | POA: Diagnosis not present

## 2019-05-28 DIAGNOSIS — I10 Essential (primary) hypertension: Secondary | ICD-10-CM | POA: Diagnosis present

## 2019-05-28 DIAGNOSIS — Z888 Allergy status to other drugs, medicaments and biological substances status: Secondary | ICD-10-CM

## 2019-05-28 DIAGNOSIS — Z880 Allergy status to penicillin: Secondary | ICD-10-CM

## 2019-05-28 DIAGNOSIS — K760 Fatty (change of) liver, not elsewhere classified: Secondary | ICD-10-CM | POA: Diagnosis present

## 2019-05-28 DIAGNOSIS — D500 Iron deficiency anemia secondary to blood loss (chronic): Secondary | ICD-10-CM | POA: Diagnosis present

## 2019-05-28 DIAGNOSIS — Z681 Body mass index (BMI) 19 or less, adult: Secondary | ICD-10-CM | POA: Diagnosis not present

## 2019-05-28 DIAGNOSIS — F419 Anxiety disorder, unspecified: Secondary | ICD-10-CM | POA: Diagnosis present

## 2019-05-28 DIAGNOSIS — F0280 Dementia in other diseases classified elsewhere without behavioral disturbance: Secondary | ICD-10-CM | POA: Diagnosis present

## 2019-05-28 DIAGNOSIS — Z90710 Acquired absence of both cervix and uterus: Secondary | ICD-10-CM

## 2019-05-28 DIAGNOSIS — Y848 Other medical procedures as the cause of abnormal reaction of the patient, or of later complication, without mention of misadventure at the time of the procedure: Secondary | ICD-10-CM | POA: Diagnosis not present

## 2019-05-28 DIAGNOSIS — Z7982 Long term (current) use of aspirin: Secondary | ICD-10-CM

## 2019-05-28 DIAGNOSIS — Z825 Family history of asthma and other chronic lower respiratory diseases: Secondary | ICD-10-CM

## 2019-05-28 DIAGNOSIS — Z8249 Family history of ischemic heart disease and other diseases of the circulatory system: Secondary | ICD-10-CM

## 2019-05-28 DIAGNOSIS — I952 Hypotension due to drugs: Secondary | ICD-10-CM | POA: Diagnosis not present

## 2019-05-28 DIAGNOSIS — F17210 Nicotine dependence, cigarettes, uncomplicated: Secondary | ICD-10-CM | POA: Diagnosis present

## 2019-05-28 DIAGNOSIS — L03113 Cellulitis of right upper limb: Secondary | ICD-10-CM | POA: Diagnosis not present

## 2019-05-28 DIAGNOSIS — R000 Tachycardia, unspecified: Secondary | ICD-10-CM | POA: Diagnosis present

## 2019-05-28 DIAGNOSIS — Z79899 Other long term (current) drug therapy: Secondary | ICD-10-CM

## 2019-05-28 DIAGNOSIS — Z90721 Acquired absence of ovaries, unilateral: Secondary | ICD-10-CM

## 2019-05-28 DIAGNOSIS — Z833 Family history of diabetes mellitus: Secondary | ICD-10-CM

## 2019-05-28 DIAGNOSIS — R072 Precordial pain: Secondary | ICD-10-CM

## 2019-05-28 DIAGNOSIS — Z881 Allergy status to other antibiotic agents status: Secondary | ICD-10-CM

## 2019-05-28 DIAGNOSIS — G309 Alzheimer's disease, unspecified: Secondary | ICD-10-CM | POA: Diagnosis present

## 2019-05-28 DIAGNOSIS — F329 Major depressive disorder, single episode, unspecified: Secondary | ICD-10-CM | POA: Diagnosis present

## 2019-05-28 DIAGNOSIS — E1165 Type 2 diabetes mellitus with hyperglycemia: Secondary | ICD-10-CM | POA: Diagnosis present

## 2019-05-28 DIAGNOSIS — Z20822 Contact with and (suspected) exposure to covid-19: Secondary | ICD-10-CM | POA: Diagnosis present

## 2019-05-28 DIAGNOSIS — M79631 Pain in right forearm: Secondary | ICD-10-CM | POA: Diagnosis not present

## 2019-05-28 DIAGNOSIS — Z8673 Personal history of transient ischemic attack (TIA), and cerebral infarction without residual deficits: Secondary | ICD-10-CM

## 2019-05-28 DIAGNOSIS — D638 Anemia in other chronic diseases classified elsewhere: Secondary | ICD-10-CM | POA: Diagnosis present

## 2019-05-28 DIAGNOSIS — R079 Chest pain, unspecified: Secondary | ICD-10-CM | POA: Diagnosis not present

## 2019-05-28 DIAGNOSIS — Z882 Allergy status to sulfonamides status: Secondary | ICD-10-CM

## 2019-05-28 LAB — CBC WITH DIFFERENTIAL/PLATELET
Abs Immature Granulocytes: 0.05 10*3/uL (ref 0.00–0.07)
Basophils Absolute: 0.1 10*3/uL (ref 0.0–0.1)
Basophils Relative: 1 %
Eosinophils Absolute: 0.1 10*3/uL (ref 0.0–0.5)
Eosinophils Relative: 1 %
HCT: 30.1 % — ABNORMAL LOW (ref 36.0–46.0)
Hemoglobin: 9.4 g/dL — ABNORMAL LOW (ref 12.0–15.0)
Immature Granulocytes: 0 %
Lymphocytes Relative: 11 %
Lymphs Abs: 1.5 10*3/uL (ref 0.7–4.0)
MCH: 25.5 pg — ABNORMAL LOW (ref 26.0–34.0)
MCHC: 31.2 g/dL (ref 30.0–36.0)
MCV: 81.6 fL (ref 80.0–100.0)
Monocytes Absolute: 1.3 10*3/uL — ABNORMAL HIGH (ref 0.1–1.0)
Monocytes Relative: 10 %
Neutro Abs: 10 10*3/uL — ABNORMAL HIGH (ref 1.7–7.7)
Neutrophils Relative %: 77 %
Platelets: 492 10*3/uL — ABNORMAL HIGH (ref 150–400)
RBC: 3.69 MIL/uL — ABNORMAL LOW (ref 3.87–5.11)
RDW: 17.7 % — ABNORMAL HIGH (ref 11.5–15.5)
WBC: 12.9 10*3/uL — ABNORMAL HIGH (ref 4.0–10.5)
nRBC: 0 % (ref 0.0–0.2)

## 2019-05-28 LAB — COMPREHENSIVE METABOLIC PANEL
ALT: 8 U/L (ref 0–44)
AST: 12 U/L — ABNORMAL LOW (ref 15–41)
Albumin: 2.8 g/dL — ABNORMAL LOW (ref 3.5–5.0)
Alkaline Phosphatase: 67 U/L (ref 38–126)
Anion gap: 10 (ref 5–15)
BUN: 11 mg/dL (ref 8–23)
CO2: 23 mmol/L (ref 22–32)
Calcium: 8.6 mg/dL — ABNORMAL LOW (ref 8.9–10.3)
Chloride: 98 mmol/L (ref 98–111)
Creatinine, Ser: 0.75 mg/dL (ref 0.44–1.00)
GFR calc Af Amer: >60 mL/min (ref >60)
GFR calc non Af Amer: >60 mL/min (ref >60)
Glucose, Bld: 135 mg/dL — ABNORMAL HIGH (ref 70–99)
Potassium: 3.8 mmol/L (ref 3.5–5.1)
Sodium: 131 mmol/L — ABNORMAL LOW (ref 135–145)
Total Bilirubin: 0.8 mg/dL (ref 0.3–1.2)
Total Protein: 6.9 g/dL (ref 6.5–8.1)

## 2019-05-28 LAB — I-STAT CHEM 8, ED
BUN: 12 mg/dL (ref 8–23)
Calcium, Ion: 1.07 mmol/L — ABNORMAL LOW (ref 1.15–1.40)
Chloride: 96 mmol/L — ABNORMAL LOW (ref 98–111)
Creatinine, Ser: 0.6 mg/dL (ref 0.44–1.00)
Glucose, Bld: 132 mg/dL — ABNORMAL HIGH (ref 70–99)
HCT: 29 % — ABNORMAL LOW (ref 36.0–46.0)
Hemoglobin: 9.9 g/dL — ABNORMAL LOW (ref 12.0–15.0)
Potassium: 3.6 mmol/L (ref 3.5–5.1)
Sodium: 131 mmol/L — ABNORMAL LOW (ref 135–145)
TCO2: 27 mmol/L (ref 22–32)

## 2019-05-28 LAB — TROPONIN I (HIGH SENSITIVITY): Troponin I (High Sensitivity): 3 ng/L (ref <18)

## 2019-05-28 MED ORDER — MORPHINE SULFATE (PF) 4 MG/ML IV SOLN
4.0000 mg | Freq: Once | INTRAVENOUS | Status: AC
  Administered 2019-05-28: 4 mg via INTRAVENOUS
  Filled 2019-05-28: qty 1

## 2019-05-28 MED ORDER — NITROGLYCERIN 0.4 MG SL SUBL
0.4000 mg | SUBLINGUAL_TABLET | SUBLINGUAL | Status: DC | PRN
  Administered 2019-05-29 (×3): 0.4 mg via SUBLINGUAL
  Filled 2019-05-28: qty 1

## 2019-05-28 MED ORDER — IOHEXOL 350 MG/ML SOLN
100.0000 mL | Freq: Once | INTRAVENOUS | Status: AC | PRN
  Administered 2019-05-28: 100 mL via INTRAVENOUS

## 2019-05-28 MED ORDER — ONDANSETRON HCL 4 MG/2ML IJ SOLN: 4.0000 mg | Freq: Four times a day (QID) | INTRAMUSCULAR | Status: DC | PRN

## 2019-05-28 MED ORDER — ASPIRIN EC 81 MG PO TBEC
81.0000 mg | DELAYED_RELEASE_TABLET | Freq: Every day | ORAL | Status: DC
  Administered 2019-05-29 – 2019-06-02 (×5): 81 mg via ORAL
  Filled 2019-05-28 (×5): qty 1

## 2019-05-28 MED ORDER — ESMOLOL HCL-SODIUM CHLORIDE 2000 MG/100ML IV SOLN
25.0000 ug/kg/min | INTRAVENOUS | Status: DC
  Administered 2019-05-28 – 2019-05-29 (×2): 50 ug/kg/min via INTRAVENOUS
  Filled 2019-05-28 (×2): qty 100

## 2019-05-28 MED ORDER — DIPHENHYDRAMINE HCL 50 MG/ML IJ SOLN
12.5000 mg | Freq: Once | INTRAMUSCULAR | Status: AC
  Administered 2019-05-28: 12.5 mg via INTRAVENOUS
  Filled 2019-05-28: qty 1

## 2019-05-28 MED ORDER — ACETAMINOPHEN 325 MG PO TABS
650.0000 mg | ORAL_TABLET | ORAL | Status: DC | PRN
  Administered 2019-05-31 – 2019-06-01 (×2): 650 mg via ORAL
  Filled 2019-05-28 (×2): qty 2

## 2019-05-28 NOTE — ED Provider Notes (Signed)
[facility] EMERGENCY DEPARTMENT Provider Note   CSN: [id number] Arrival date & time: 05/28/19  2126     History Chief Complaint  Patient presents with  . Chest Pain    Chrislynn Mosely is a 64 y.o. female.  The history is provided by the patient and medical records. No language interpreter was used.  Chest Pain  Nobuko Gsell is a 64 y.o. female who presents to the Emergency Department complaining of chest pain. She complains of severe left sided chest pain and left arm pain that began around 3 PM this afternoon. The pain began after she was startled thinking that would there was someone in her home. Pain is severe and constant in nature. She also feels that she is having difficulty getting her words out at times. She denies any fevers, nausea, vomiting, abdominal pain. She has been compliant with her medications since her recent hospital admission for aortic aneurysm repair. She was treated with aspirin 324, three nitroglycerin's prior to ED arrival. She had no significant change in her pain following nitroglycerin administration.    Past Medical History:  Diagnosis Date  . Alzheimer disease (HCC)   . Anxiety   . Bilateral pneumonia 01/2017   per new patient packet   . COPD (chronic obstructive pulmonary disease) (HCC) 01/2017   per new patient packet   . Depression   . Fatty liver   . Gallbladder disease 2014   per new patient packet   . Hernia, abdominal   . Hypertension   . Liver damage 01/2017   per new patient packet, Dr.Kadakia  . Normal cardiac stress test 2007  . Ovarian cyst 1977   8 1/2 lb left ovarian cyst, Dr.Cox, per new patient packet   . Pneumonia   . Right ovarian cyst    1992, 1993, 1994, 1995, and 1996 Dr.Neil, per new patient packet   . Stroke (HCC)    4 strokes.     Patient Active Problem List   Diagnosis Date Noted  . Thoracic aortic aneurysm, ruptured (HCC) 05/11/2019  . Cerebrovascular accident (CVA) (HCC) 02/19/2019  .  Lacunar infarct, acute (HCC) 01/05/2019  . Altered mental status associated with intoxication (HCC) 01/05/2019  . Hypotension 01/05/2019  . Left wrist fracture 01/05/2019  . AKI (acute kidney injury) (HCC) 01/05/2019  . Restless leg syndrome 07/10/2018  . Peripheral neuropathy 07/10/2018  . COPD (chronic obstructive pulmonary disease) (HCC) 10/05/2017  . GERD (gastroesophageal reflux disease) 10/05/2017  . Depression 10/05/2017  . Facial cellulitis-left 10/05/2017  . Diarrhea 10/05/2017  . Chronic viral hepatitis C (HCC) 06/20/2017  . Idiopathic peripheral neuropathy 05/19/2017  . Chronic low back pain without sciatica 05/19/2017  . Hyperglycemia 05/19/2017  . Chronic bronchitis (HCC) 03/08/2017  . Elevated LFTs 03/08/2017  . 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 GAD (generalized anxiety disorder) 03/08/2017  . Hepatomegaly 03/08/2017  . Acute exacerbation of chronic obstructive pulmonary disease (COPD) (HCC) 02/16/2017  . Acute respiratory failure with hypoxia (HCC)   . COPD with acute exacerbation (HCC) 01/27/2017  . CAP (community acquired pneumonia) 01/27/2017  . Tachycardia 01/27/2017  . COPD exacerbation (HCC) 01/27/2017  . Hypokalemia 01/27/2017  . Left arm numbness 11/28/2016    Class: Chronic  . Nondisplaced transverse fracture of left patella, initial encounter for closed fracture 06/10/2016  . Malnutrition of moderate degree (HCC) 07/12/2013  . Syncope 07/11/2013  . GASTROENTERITIS 05/07/2010  . TOBACCO ABUSE 07/01/2009  . Benign essential HTN  06/24/2009  . BRONCHITIS, ACUTE 06/24/2009    Past Surgical History:  Procedure Laterality Date  . ABDOMINAL HYSTERECTOMY Bilateral    Total  . CESAREAN SECTION    . CHOLECYSTECTOMY    . INGUINAL HERNIA REPAIR Right   . LEFT OOPHORECTOMY  1977   with cyst removal, age 32  . MINOR EXCISION OF ORAL LESION N/A 10/07/2017   Procedure: IRRIGATION AND DEBRIDEMENT  OF ORAL INFECTION, REMOVAL OF REMAINING 8 TEETH AND PLACEMENT OF PENROSE DRAINS;  Surgeon: Vivia Ewing, DMD;  Location: [facility] ORS;  Service: Oral Surgery;  Laterality: N/A;  . OVARIAN CYST REMOVAL Right 93, 94,95, 96  . THORACIC AORTIC ENDOVASCULAR STENT GRAFT N/A 05/11/2019   Procedure: THORACIC AORTIC ENDOVASCULAR STENT GRAFT, RETROPERITONEAL CONDUIT RIGHT ILIAC ARTERY;  Surgeon: Chuck Hint, MD;  Location: [facility];  Service: Vascular;  Laterality: N/A;  . UMBILICAL HERNIA REPAIR       OB History   No obstetric history on file.     Family History  Problem Relation Age of Onset  . Stroke Mother   . COPD Mother   . Dementia Mother   . Breast cancer Mother   . Diabetes Father   . Heart attack Father   . Diabetes Sister   . Stroke Sister        x2  . Hepatitis C Daughter   . Diabetes Paternal Grandmother     Social History   Tobacco Use  . Smoking status: Current Every Day Smoker    Packs/day: 1.00    Years: 46.00    Pack years: 46.00    Types: Cigarettes  . Smokeless tobacco: Never Used  Substance Use Topics  . Alcohol use: Yes    Alcohol/week: 1.0 standard drinks    Types: 1 Cans of beer per week    Comment: once or twice a year.   . 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 Authorizing Provider  nitroGLYCERIN (NITROSTAT) 0.4 MG SL tablet Place 1 tablet (0.4 mg total) under the tongue every 5 (five) minutes x 3 doses as needed for chest pain (chest pain). 02/18/17  Yes Orpah Cobb, MD  albuterol (PROVENTIL HFA;VENTOLIN HFA) 108 (90 Base) MCG/ACT inhaler Inhale 2 puffs into the lungs every 6 (six) hours as needed for wheezing or shortness of breath.    [provider]  alendronate (FOSAMAX) 70 MG tablet Take with a full glass of water on an empty stomach. Patient taking differently: Take 70 mg by mouth once a week. Take with a full  glass of water on an empty stomach. Wednesday 05/06/19   Ngetich, Dinah C, NP  aspirin EC 81 MG EC tablet Take 1 tablet (81 mg total) by mouth daily. 01/07/19   Jae Dire, MD  atorvastatin (LIPITOR) 10 MG tablet Take 1 tablet (10 mg total) by mouth daily at 6 PM. 05/15/19   Orpah Cobb, MD  budesonide-formoterol [facility]) 160-4.5 MCG/ACT inhaler Inhale 2 puffs into the lungs 2 (two) times daily as needed (for flares).     [provider]  citalopram (CELEXA) 40 MG tablet Take 0.5 tablets (20 mg total) by mouth daily for 30 days. 05/04/18 05/28/19  Ngetich, Dinah C, NP  docusate sodium (COLACE) 100 MG capsule Take 1 capsule (100 mg total) by mouth daily. 05/15/19   Orpah Cobb, MD  gabapentin (NEURONTIN) 300  MG capsule Take 2 capsules (600 mg total) by mouth 2 (two) times daily. Take 600 mg by mouth in the morning and 600 mg at bedtime Patient taking differently: Take 900 mg by mouth See admin instructions. Take 900 mg by mouth in the morning and 900 mg at bedtime 02/19/19   Levert Feinstein, MD  guaiFENesin-dextromethorphan [facility] DM) 100-10 MG/5ML syrup Take 15 mLs by mouth every 4 (four) hours as needed for cough. 05/15/19   Orpah Cobb, MD  HYDROcodone-acetaminophen (NORCO/VICODIN) 5-325 MG tablet Take 1 tablet by mouth every 4 (four) hours as needed for moderate pain. 05/15/19 05/14/20  Setzer, Lynnell Jude, PA-C  losartan (COZAAR) 50 MG tablet Take 1 tablet (50 mg total) daily by mouth. 02/02/17   Vassie Loll, MD  metoprolol tartrate (LOPRESSOR) 25 MG tablet Take 1 tablet (25 mg total) by mouth 2 (two) times daily. 05/15/19   Orpah Cobb, MD  nicotine (NICODERM CQ - DOSED IN MG/24 HOURS) 21 mg/24hr patch Place 1 patch (21 mg total) onto the skin daily. 05/16/19   Orpah Cobb, MD  ondansetron (ZOFRAN) 4 MG tablet  05/20/19   [provider]  pantoprazole (PROTONIX) 40 MG tablet Take 1 tablet (40 mg total) by mouth daily. 05/16/19   Orpah Cobb, MD  potassium chloride SA  (KLOR-CON) 10 MEQ tablet Take 1 tablet (10 mEq total) by mouth daily. 05/15/19   Orpah Cobb, MD    Allergies    Amoxicillin, Clindamycin/lincomycin, Ropinirole, Wellbutrin [bupropion], Sulfamethoxazole, and Sulfonamide derivatives  Review of Systems   Review of Systems  Cardiovascular: Positive for chest pain.  All other systems reviewed and are negative.   Physical Exam Updated Vital Signs BP (!) 102/57   Pulse 94   Temp 100.3 F (37.9 C) (Oral)   Resp 17   SpO2 94%   Physical Exam Vitals and nursing note reviewed.  Constitutional:      General: She is in acute distress.     Appearance: She is well-developed. She is ill-appearing.  HENT:     Head: Normocephalic and atraumatic.  Cardiovascular:     Rate and Rhythm: Normal rate and regular rhythm.     Heart sounds: No murmur.  Pulmonary:     Effort: Pulmonary effort is normal. No respiratory distress.     Breath sounds: Normal breath sounds.  Abdominal:     Palpations: Abdomen is soft.     Tenderness: There is no abdominal tenderness. There is no guarding or rebound.  Musculoskeletal:        General: No tenderness.     Comments: 2+ radial and DP pulses  Skin:    General: Skin is warm and dry.  Neurological:     Mental Status: She is alert and oriented to person, place, and time.  Psychiatric:        Behavior: Behavior normal.     ED Results / Procedures / Treatments   Labs (all labs ordered are listed, but only abnormal results are displayed) Labs Reviewed  COMPREHENSIVE METABOLIC PANEL - Abnormal; Notable for the following components:      Result Value   Sodium 131 (*)    Glucose, Bld 135 (*)    Calcium 8.6 (*)    Albumin 2.8 (*)    AST 12 (*)    All other components within normal limits  CBC WITH DIFFERENTIAL/PLATELET - Abnormal; Notable for the following components:   WBC 12.9 (*)    RBC 3.69 (*)    Hemoglobin 9.4 (*)  HCT 30.1 (*)    MCH 25.5 (*)    RDW 17.7 (*)    Platelets 492 (*)     Neutro Abs 10.0 (*)    Monocytes Absolute 1.3 (*)    All other components within normal limits  I-STAT CHEM 8, ED - Abnormal; Notable for the following components:   Sodium 131 (*)    Chloride 96 (*)    Glucose, Bld 132 (*)    Calcium, Ion 1.07 (*)    Hemoglobin 9.9 (*)    HCT 29.0 (*)    All other components within normal limits  SARS CORONAVIRUS 2 (TAT 6-24 HRS)  TROPONIN I (HIGH SENSITIVITY)  TROPONIN I (HIGH SENSITIVITY)    EKG EKG Interpretation  Date/Time:  Tuesday May 28 2019 21:30:58 EST Ventricular Rate:  131 PR Interval:    QRS Duration: 76 QT Interval:  293 QTC Calculation: 433 R Axis:   84 Text Interpretation: Sinus tachycardia Borderline right axis deviation Minimal ST depression, lateral leads Confirmed by Quintella Reichert [phone]) on 05/28/2019 10:05:48 PM   Radiology DG Chest Port 1 View  Result Date: 05/28/2019 CLINICAL DATA:  Chest pain EXAM: PORTABLE CHEST 1 VIEW COMPARISON:  05/11/2019 FINDINGS: Cardiac shadow is stable. Aortic stent graft is noted and stable. Lungs are well aerated bilaterally. No acute bony abnormality is seen. IMPRESSION: No acute abnormality noted.  Stable aortic stent graft is seen. Electronically Signed   By: Inez Catalina M.D.   On: 05/28/2019 22:05   CT Angio Chest/Abd/Pel for Dissection W and/or W/WO  Result Date: 05/28/2019 CLINICAL DATA:  Chest pain and back pain. Concern for aortic dissection. EXAM: CT ANGIOGRAPHY CHEST, ABDOMEN AND PELVIS TECHNIQUE: Multidetector CT imaging through the chest, abdomen and pelvis was performed using the standard protocol during bolus administration of intravenous contrast. Multiplanar reconstructed images and MIPs were obtained and reviewed to evaluate the vascular anatomy. CONTRAST:  155mL OMNIPAQUE IOHEXOL 350 MG/ML SOLN COMPARISON:  None. FINDINGS: CTA CHEST FINDINGS Cardiovascular: The patient is status post prior thoracic aortic endograft placement within the descending thoracic aorta. The graft is  patent and well opposed to the arterial walls. The excluded aneurysmal sac has decreased in size and measures approximately 4.8 x 4.2 cm (previously measuring approximately 4.6 by 5 cm. There is no obvious endoleak, however evaluation is limited by lack of additional sequences. There is no ascending thoracic aortic aneurysm. Coronary artery calcifications are noted. There is no large centrally located pulmonary embolism. The heart size is normal. The arch vessels are grossly patent. There is no significant pericardial effusion. Mediastinum/Nodes: --No mediastinal or hilar lymphadenopathy. --No axillary lymphadenopathy. --No supraclavicular lymphadenopathy. --Normal thyroid gland. --The esophagus is unremarkable Lungs/Pleura: Evaluation of the lung fields is limited by respiratory motion artifact. There is atelectasis at the left lung base. There is no significant pleural effusion. No pneumothorax. The trachea is unremarkable. Musculoskeletal: There is a healing fracture of the sternal body. Review of the MIP images confirms the above findings. CTA ABDOMEN AND PELVIS FINDINGS VASCULAR Aorta: Atherosclerotic changes are noted throughout the abdominal aorta. There is no evidence for abdominal aortic aneurysm. Evaluation is degraded by extensive motion artifact. Celiac: Patent without evidence of aneurysm, dissection, vasculitis or significant stenosis. SMA: Patent without evidence of aneurysm, dissection, vasculitis or significant stenosis. Renals: The renal arteries are poorly evaluated secondary to extensive motion artifact. The visualized portions appear to be patent. IMA: Patent without evidence of aneurysm, dissection, vasculitis or significant stenosis. Inflow: Patent without evidence of aneurysm, dissection, vasculitis  or significant stenosis. Veins: No obvious venous abnormality within the limitations of this arterial phase study. Review of the MIP images confirms the above findings. NON-VASCULAR Hepatobiliary:  The liver is normal. Status post cholecystectomy.There is no biliary ductal dilation. Pancreas: Normal contours without ductal dilatation. No peripancreatic fluid collection. Spleen: No splenic laceration or hematoma. Adrenals/Urinary Tract: --Adrenal glands: No adrenal hemorrhage. --Right kidney/ureter: No hydronephrosis or perinephric hematoma. --Left kidney/ureter: No hydronephrosis or perinephric hematoma. --Urinary bladder: Unremarkable. Stomach/Bowel: --Stomach/Duodenum: No hiatal hernia or other gastric abnormality. Normal duodenal course and caliber. --Small bowel: No dilatation or inflammation. --Colon: Rectosigmoid diverticulosis without acute inflammation. --Appendix: Not visualized. No right lower quadrant inflammation or free fluid. Lymphatic: --No retroperitoneal lymphadenopathy. --No mesenteric lymphadenopathy. --No pelvic or inguinal lymphadenopathy. Reproductive: Status post hysterectomy. No adnexal mass. Other: No ascites or free air. The abdominal wall is normal. Musculoskeletal. No acute displaced fractures. Review of the MIP images confirms the above findings. IMPRESSION: 1. Study is severely degraded by motion artifact. 2. No definite acute vascular abnormality detected. There is a patent thoracic aortic endograft without evidence for thoracic aortic dissection or aneurysm. 3. Diverticulosis without CT evidence for diverticulitis. 4.  Aortic Atherosclerosis (ICD10-I70.0). Electronically Signed   By: Katherine Mantlehristopher  Green M.D.   On: 05/28/2019 22:55    Procedures Procedures (including critical care time) CRITICAL CARE Performed by: Tilden FossaElizabeth Takita Riecke   Total critical care time: 35 minutes  Critical care time was exclusive of separately billable procedures and treating other patients.  Critical care was necessary to treat or prevent imminent or life-threatening deterioration.  Critical care was time spent personally by me on the following activities: development of treatment plan with  patient and/or surrogate as well as nursing, discussions with consultants, evaluation of patient's response to treatment, examination of patient, obtaining history from patient or surrogate, ordering and performing treatments and interventions, ordering and review of laboratory studies, ordering and review of radiographic studies, pulse oximetry and re-evaluation of patient's condition.  Medications Ordered in ED Medications  esmolol (BREVIBLOC) 2000 mg / 100 mL (20 mg/mL) infusion (50 mcg/kg/min  47.3 kg Intravenous New Bag/Given 05/28/19 2200)  morphine 4 MG/ML injection 4 mg (4 mg Intravenous Given 05/28/19 2151)  iohexol (OMNIPAQUE) 350 MG/ML injection 100 mL (100 mLs Intravenous Contrast Given 05/28/19 2233)  morphine 4 MG/ML injection 4 mg (4 mg Intravenous Given 05/28/19 2310)  diphenhydrAMINE (BENADRYL) injection 12.5 mg (12.5 mg Intravenous Given 05/28/19 2338)    ED Course  I have reviewed the triage vital signs and the nursing notes.  Pertinent labs & imaging results that were available during my care of the patient were reviewed by me and considered in my medical decision making (see chart for details).    MDM Rules/Calculators/A&P                     Patient with history of COPD, recently thoracic aortic aneurysm repair here for evaluation of severe left sided chest pain and arm pain. Distress on evaluation with tachycardia, writhing on the stretcher. She is well perfused. Given her recent surgery there was concern for possible dissection. She is tachycardic and esmolol was started for rate control. Her pain did improve with esmolol and morphine administration. CTA is negative for dissection. Discussed with Dr. Algie CofferKadakia with cardiology, who will see the patient and consult for chest pain.  Final Clinical Impression(s) / ED Diagnoses Final diagnoses:  Precordial pain    Rx / DC Orders ED Discharge Orders    None  Tilden Fossa, MD 05/28/19 2355

## 2019-05-28 NOTE — ED Triage Notes (Signed)
Pt from home via EMS for chest pain that began around 1800 this evening radiating to left arm. Pt recently here and admitted for NSTEMI and had 3 stents placed at that time. States she is compliant with all meds.

## 2019-05-28 NOTE — H&P (Signed)
Referring Physician:  [name] is an 64 y.o. female.                       Chief Complaint: Chest pain  HPI: 64 year old white female presented with chest pain, left arm pain and numbness and visual and speech disturbance lasting for few hours. 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 She has PMH of CAD, COPD, neck and back pain, stroke, depression, anemia and protein calorie malnutrition. Currently she is chest pain free. HS-Troponin I is normal. EKG shows sinus tachycardia. Chest x-ray is stable. CT chest was severely degraded by motion artifact otherwise with stable and patent thoracic aortic endograft.  Past Medical History:  Diagnosis Date  . Alzheimer disease (HCC)   . Anxiety   . Bilateral pneumonia 01/2017   per new patient packet   . COPD (chronic obstructive pulmonary disease) (HCC) 01/2017   per new patient packet   . Depression   . Fatty liver   . Gallbladder disease 2014   per new patient packet   . Hernia, abdominal   . Hypertension   . Liver damage 01/2017   per new patient packet, Dr.Claron Rosencrans  . Normal cardiac stress test 2007  . Ovarian cyst 1977   8 1/2 lb left ovarian cyst, Dr.Cox, per new patient packet   . Pneumonia   . Right ovarian cyst    1992, 1993, 1994, 1995, and 1996 Dr.Neil, per new patient packet   . Stroke (HCC)    4 strokes.       Past Surgical History:  Procedure Laterality Date  . ABDOMINAL HYSTERECTOMY Bilateral    Total  . CESAREAN SECTION    . CHOLECYSTECTOMY    . INGUINAL HERNIA REPAIR Right   . LEFT OOPHORECTOMY  1977   with cyst removal, age 46  . MINOR EXCISION OF ORAL LESION N/A 10/07/2017   Procedure: IRRIGATION AND DEBRIDEMENT OF ORAL INFECTION, REMOVAL OF REMAINING 8 TEETH AND PLACEMENT OF PENROSE DRAINS;  Surgeon: Vivia Ewing, DMD;  Location: [facility];  Service: Oral Surgery;  Laterality: N/A;  . OVARIAN CYST REMOVAL Right 93, 94,95, 96  . THORACIC AORTIC ENDOVASCULAR STENT GRAFT N/A 05/11/2019    Procedure: THORACIC AORTIC ENDOVASCULAR STENT GRAFT, RETROPERITONEAL CONDUIT RIGHT ILIAC ARTERY;  Surgeon: Chuck Hint, MD;  Location: [facility] OR;  Service: Vascular;  Laterality: N/A;  . UMBILICAL HERNIA REPAIR      Family History  Problem Relation Age of Onset  . Stroke Mother   . COPD Mother   . Dementia Mother   . Breast cancer Mother   . Diabetes Father   . Heart attack Father   . Diabetes Sister   . Stroke Sister        x2  . Hepatitis C Daughter   . Diabetes Paternal Grandmother    Social History:  reports that she has been smoking cigarettes. She has a 46.00 pack-year smoking history. She has never used smokeless tobacco. She reports current alcohol use of about 1.0 standard drinks of alcohol per week. She reports previous drug use.  Allergies:  Allergies  Allergen Reactions  . Amoxicillin Hives and Rash     Has patient had a PCN reaction causing immediate rash, facial/tongue/throat swelling, SOB or lightheadedness with hypotension: Yes, hives Has patient had a PCN reaction causing severe rash involving mucus membranes or skin necrosis: No Has patient had a PCN reaction that required hospitalization No Has patient  had a PCN reaction occurring within the last 10 years: Yes If all of the above answers are "NO", then may proceed with Cephalosporin use.   . Clindamycin/Lincomycin Diarrhea and Other (See Comments)    Patient states that clindamycin causes diarrhea   . Ropinirole Other (See Comments)    "Made me insane" (per the patient)  . Wellbutrin [Bupropion] Diarrhea and Other (See Comments)    Lightheadedness, also  . Sulfamethoxazole Nausea And Vomiting and Rash  . Sulfonamide Derivatives Nausea And Vomiting and Rash    (Not in a hospital admission)   Results for orders placed or performed during the hospital encounter of 05/28/19 (from the past 48 hour(s))  Comprehensive metabolic panel     Status: Abnormal   Collection Time: 05/28/19  9:43 PM  Result  Value Ref Range   Sodium 131 (L) 135 - 145 mmol/L   Potassium 3.8 3.5 - 5.1 mmol/L   Chloride 98 98 - 111 mmol/L   CO2 23 22 - 32 mmol/L   Glucose, Bld 135 (H) 70 - 99 mg/dL    Comment: Glucose reference range applies only to samples taken after fasting for at least 8 hours.   BUN 11 8 - 23 mg/dL   Creatinine, Ser 4.49 0.44 - 1.00 mg/dL   Calcium 8.6 (L) 8.9 - 10.3 mg/dL   Total Protein 6.9 6.5 - 8.1 g/dL   Albumin 2.8 (L) 3.5 - 5.0 g/dL   AST 12 (L) 15 - 41 U/L   ALT 8 0 - 44 U/L   Alkaline Phosphatase 67 38 - 126 U/L   Total Bilirubin 0.8 0.3 - 1.2 mg/dL   GFR calc non Af Amer >60 >60 mL/min   GFR calc Af Amer >60 >60 mL/min   Anion gap 10 5 - 15    Comment: Performed at [facility], [address], [place], [place] [address]  CBC with Differential     Status: Abnormal   Collection Time: 05/28/19  9:43 PM  Result Value Ref Range   WBC 12.9 (H) 4.0 - 10.5 K/uL   RBC 3.69 (L) 3.87 - 5.11 MIL/uL   Hemoglobin 9.4 (L) 12.0 - 15.0 g/dL   HCT 71.2 (L) 19.7 - 58.8 %   MCV 81.6 80.0 - 100.0 fL   MCH 25.5 (L) 26.0 - 34.0 pg   MCHC 31.2 30.0 - 36.0 g/dL   RDW 32.5 (H) 49.8 - 26.4 %   Platelets 492 (H) 150 - 400 K/uL   nRBC 0.0 0.0 - 0.2 %   Neutrophils Relative % 77 %   Neutro Abs 10.0 (H) 1.7 - 7.7 K/uL   Lymphocytes Relative 11 %   Lymphs Abs 1.5 0.7 - 4.0 K/uL   Monocytes Relative 10 %   Monocytes Absolute 1.3 (H) 0.1 - 1.0 K/uL   Eosinophils Relative 1 %   Eosinophils Absolute 0.1 0.0 - 0.5 K/uL   Basophils Relative 1 %   Basophils Absolute 0.1 0.0 - 0.1 K/uL   Immature Granulocytes 0 %   Abs Immature Granulocytes 0.05 0.00 - 0.07 K/uL    Comment: Performed at [facility], [address], [place], [place] [address]  Troponin I (High Sensitivity)     Status: None   Collection Time: 05/28/19  9:43 PM  Result Value Ref Range   Troponin I (High Sensitivity) 3 <18 ng/L    Comment: (NOTE) Elevated high sensitivity troponin I (hsTnI) values and significant   changes across serial measurements  may suggest ACS but many other  chronic and acute conditions are known to elevate hsTnI results.  Refer to the "Links" section for chest pain algorithms and additional  guidance. Performed at [facility], [address] [place], [place] [address]   I-stat chem 8, ED (not at [facility] or [facility])     Status: Abnormal   Collection Time: 05/28/19 10:02 PM  Result Value Ref Range   Sodium 131 (L) 135 - 145 mmol/L   Potassium 3.6 3.5 - 5.1 mmol/L   Chloride 96 (L) 98 - 111 mmol/L   BUN 12 8 - 23 mg/dL   Creatinine, Ser 6.44 0.44 - 1.00 mg/dL   Glucose, Bld 034 (H) 70 - 99 mg/dL    Comment: Glucose reference range applies only to samples taken after fasting for at least 8 hours.   Calcium, Ion 1.07 (L) 1.15 - 1.40 mmol/L   TCO2 27 22 - 32 mmol/L   Hemoglobin 9.9 (L) 12.0 - 15.0 g/dL   HCT 74.2 (L) 59.5 - 63.8 %   DG Chest Port 1 View  Result Date: 05/28/2019 CLINICAL DATA:  Chest pain EXAM: PORTABLE CHEST 1 VIEW COMPARISON:  05/11/2019 FINDINGS: Cardiac shadow is stable. Aortic stent graft is noted and stable. Lungs are well aerated bilaterally. No acute bony abnormality is seen. IMPRESSION: No acute abnormality noted.  Stable aortic stent graft is seen. Electronically Signed   By: Alcide Clever M.D.   On: 05/28/2019 22:05   CT Angio Chest/Abd/Pel for Dissection W and/or W/WO  Result Date: 05/28/2019 CLINICAL DATA:  Chest pain and back pain. Concern for aortic dissection. EXAM: CT ANGIOGRAPHY CHEST, ABDOMEN AND PELVIS TECHNIQUE: Multidetector CT imaging through the chest, abdomen and pelvis was performed using the standard protocol during bolus administration of intravenous contrast. Multiplanar reconstructed images and MIPs were obtained and reviewed to evaluate the vascular anatomy. CONTRAST:  OMNIPAQUE IOHEXOL 350 MG/ML SOLN COMPARISON:  None. FINDINGS: CTA CHEST FINDINGS Cardiovascular: The patient is status post prior thoracic aortic endograft  placement within the descending thoracic aorta. The graft is patent and well opposed to the arterial walls. The excluded aneurysmal sac has decreased in size and measures approximately 4.8 x 4.2 cm (previously measuring approximately 4.6 by 5 cm. There is no obvious endoleak, however evaluation is limited by lack of additional sequences. There is no ascending thoracic aortic aneurysm. Coronary artery calcifications are noted. There is no large centrally located pulmonary embolism. The heart size is normal. The arch vessels are grossly patent. There is no significant pericardial effusion. Mediastinum/Nodes: --No mediastinal or hilar lymphadenopathy. --No axillary lymphadenopathy. --No supraclavicular lymphadenopathy. --Normal thyroid gland. --The esophagus is unremarkable Lungs/Pleura: Evaluation of the lung fields is limited by respiratory motion artifact. There is atelectasis at the left lung base. There is no significant pleural effusion. No pneumothorax. The trachea is unremarkable. Musculoskeletal: There is a healing fracture of the sternal body. Review of the MIP images confirms the above findings. CTA ABDOMEN AND PELVIS FINDINGS VASCULAR Aorta: Atherosclerotic changes are noted throughout the abdominal aorta. There is no evidence for abdominal aortic aneurysm. Evaluation is degraded by extensive motion artifact. Celiac: Patent without evidence of aneurysm, dissection, vasculitis or significant stenosis. SMA: Patent without evidence of aneurysm, dissection, vasculitis or significant stenosis. Renals: The renal arteries are poorly evaluated secondary to extensive motion artifact. The visualized portions appear to be patent. IMA: Patent without evidence of aneurysm, dissection, vasculitis or significant stenosis. Inflow: Patent without evidence of aneurysm, dissection, vasculitis or  significant stenosis. Veins: No obvious venous abnormality within the limitations of this arterial phase study. Review of the MIP  images confirms the above findings. NON-VASCULAR Hepatobiliary: The liver is normal. Status post cholecystectomy.There is no biliary ductal dilation. Pancreas: Normal contours without ductal dilatation. No peripancreatic fluid collection. Spleen: No splenic laceration or hematoma. Adrenals/Urinary Tract: --Adrenal glands: No adrenal hemorrhage. --Right kidney/ureter: No hydronephrosis or perinephric hematoma. --Left kidney/ureter: No hydronephrosis or perinephric hematoma. --Urinary bladder: Unremarkable. Stomach/Bowel: --Stomach/Duodenum: No hiatal hernia or other gastric abnormality. Normal duodenal course and caliber. --Small bowel: No dilatation or inflammation. --Colon: Rectosigmoid diverticulosis without acute inflammation. --Appendix: Not visualized. No right lower quadrant inflammation or free fluid. Lymphatic: --No retroperitoneal lymphadenopathy. --No mesenteric lymphadenopathy. --No pelvic or inguinal lymphadenopathy. Reproductive: Status post hysterectomy. No adnexal mass. Other: No ascites or free air. The abdominal wall is normal. Musculoskeletal. No acute displaced fractures. Review of the MIP images confirms the above findings. IMPRESSION: 1. Study is severely degraded by motion artifact. 2. No definite acute vascular abnormality detected. There is a patent thoracic aortic endograft without evidence for thoracic aortic dissection or aneurysm. 3. Diverticulosis without CT evidence for diverticulitis. 4.  Aortic Atherosclerosis (ICD10-I70.0). Electronically Signed   By: Constance Holster M.D.   On: 05/28/2019 22:55    Review Of Systems Constitutional: No fever, chills, postive chronic weight loss. Eyes: No vision change, wears glasses. No discharge or pain. Ears: No hearing loss, No tinnitus. Respiratory: No asthma, positive COPD, pneumonias, shortness of breath. No hemoptysis. Cardiovascular: Positive chest pain, palpitation, no leg edema. Gastrointestinal: Positive nausea, vomiting,  diarrhea, constipation. No GI bleed. No hepatitis. Genitourinary: No dysuria, hematuria, kidney stone. No incontinance. Neurological: Positive headache, stroke, no seizures.  Psychiatry: No psych facility admission for anxiety, depression, suicide. No detox. Skin: No rash. Musculoskeletal: Positive joint pain, fibromyalgia, neck pain, back pain. Lymphadenopathy: No lymphadenopathy. Hematology: Positive anemia. No easy bruising.   Blood pressure 105/67, pulse 98, temperature 100.3 F (37.9 C), temperature source Oral, resp. rate (!) 31, SpO2 95 %. There is no height or weight on file to calculate BMI. General appearance: alert, cooperative, appears stated age and mild respiratory distress Head: Normocephalic, atraumatic. Eyes: Blue eyes, pink conjunctiva, corneas clear. PERRL, EOM's intact. Neck: No adenopathy, no carotid bruit, no JVD, supple, symmetrical, trachea midline and thyroid not enlarged. Resp: Clear to auscultation bilaterally. Cardio: Regular rate and rhythm, S1, S2 normal, II/VI systolic murmur, no click, rub or gallop GI: Soft, mild epigastric tenderness; bowel sounds normal; no organomegaly. Extremities: No edema, cyanosis or clubbing. Skin: Warm and dry.  Neurologic: Alert and oriented X 3, normal strength. Normal coordination.   Assessment/Plan Acute coronary syndrome Sinus tachycardia Tobacco use disorder Hypertension Recent descending aortic dissection with endograft stents placement Anemia Moderate protein calorie malnutrition TIA H/O stroke Mild hyperglycemia  Admit IV heparin. R/O MI Home medications including Beta blockers.  Time spent: Review of old records, Lab, x-rays, EKG, other cardiac tests, examination, discussion with patient over 70 minutes.  Birdie Riddle, MD  05/28/2019, 11:50 PM

## 2019-05-29 ENCOUNTER — Other Ambulatory Visit: Payer: Self-pay

## 2019-05-29 ENCOUNTER — Encounter (HOSPITAL_COMMUNITY): Payer: Self-pay | Admitting: Cardiovascular Disease

## 2019-05-29 LAB — LIPID PANEL
Cholesterol: 102 mg/dL (ref 0–200)
HDL: 23 mg/dL — ABNORMAL LOW (ref >40)
LDL Cholesterol: 58 mg/dL (ref 0–99)
Total CHOL/HDL Ratio: 4.4 RATIO
Triglycerides: 103 mg/dL (ref <150)
VLDL: 21 mg/dL (ref 0–40)

## 2019-05-29 LAB — CBC
HCT: 29.6 % — ABNORMAL LOW (ref 36.0–46.0)
HCT: 32.7 % — ABNORMAL LOW (ref 36.0–46.0)
Hemoglobin: 9.5 g/dL — ABNORMAL LOW (ref 12.0–15.0)
Hemoglobin: 9.7 g/dL — ABNORMAL LOW (ref 12.0–15.0)
MCH: 25.3 pg — ABNORMAL LOW (ref 26.0–34.0)
MCH: 25.8 pg — ABNORMAL LOW (ref 26.0–34.0)
MCHC: 29.7 g/dL — ABNORMAL LOW (ref 30.0–36.0)
MCHC: 32.1 g/dL (ref 30.0–36.0)
MCV: 80.4 fL (ref 80.0–100.0)
MCV: 85.4 fL (ref 80.0–100.0)
Platelets: 472 10*3/uL — ABNORMAL HIGH (ref 150–400)
Platelets: 482 10*3/uL — ABNORMAL HIGH (ref 150–400)
RBC: 3.68 MIL/uL — ABNORMAL LOW (ref 3.87–5.11)
RBC: 3.83 MIL/uL — ABNORMAL LOW (ref 3.87–5.11)
RDW: 17.9 % — ABNORMAL HIGH (ref 11.5–15.5)
RDW: 18.1 % — ABNORMAL HIGH (ref 11.5–15.5)
WBC: 12.4 10*3/uL — ABNORMAL HIGH (ref 4.0–10.5)
WBC: 13.4 10*3/uL — ABNORMAL HIGH (ref 4.0–10.5)
nRBC: 0 % (ref 0.0–0.2)
nRBC: 0 % (ref 0.0–0.2)

## 2019-05-29 LAB — BASIC METABOLIC PANEL
Anion gap: 14 (ref 5–15)
BUN: 10 mg/dL (ref 8–23)
CO2: 20 mmol/L — ABNORMAL LOW (ref 22–32)
Calcium: 8.8 mg/dL — ABNORMAL LOW (ref 8.9–10.3)
Chloride: 99 mmol/L (ref 98–111)
Creatinine, Ser: 0.85 mg/dL (ref 0.44–1.00)
GFR calc Af Amer: >60 mL/min (ref >60)
GFR calc non Af Amer: >60 mL/min (ref >60)
Glucose, Bld: 113 mg/dL — ABNORMAL HIGH (ref 70–99)
Potassium: 3.8 mmol/L (ref 3.5–5.1)
Sodium: 133 mmol/L — ABNORMAL LOW (ref 135–145)

## 2019-05-29 LAB — HEPARIN LEVEL (UNFRACTIONATED)
Heparin Unfractionated: <0.1 IU/mL — ABNORMAL LOW (ref 0.30–0.70)
Heparin Unfractionated: <0.1 IU/mL — ABNORMAL LOW (ref 0.30–0.70)

## 2019-05-29 LAB — PROTIME-INR
INR: 1.3 — ABNORMAL HIGH (ref 0.8–1.2)
Prothrombin Time: 15.9 seconds — ABNORMAL HIGH (ref 11.4–15.2)

## 2019-05-29 LAB — SARS CORONAVIRUS 2 (TAT 6-24 HRS): SARS Coronavirus 2: NEGATIVE

## 2019-05-29 LAB — HEMOGLOBIN A1C
Hgb A1c MFr Bld: 6.7 % — ABNORMAL HIGH (ref 4.8–5.6)
Mean Plasma Glucose: 145.59 mg/dL

## 2019-05-29 LAB — TROPONIN I (HIGH SENSITIVITY): Troponin I (High Sensitivity): 4 ng/L (ref <18)

## 2019-05-29 MED ORDER — ATORVASTATIN CALCIUM 10 MG PO TABS
10.0000 mg | ORAL_TABLET | Freq: Every day | ORAL | Status: DC
  Administered 2019-05-29 – 2019-06-01 (×3): 10 mg via ORAL
  Filled 2019-05-29 (×4): qty 1

## 2019-05-29 MED ORDER — CARVEDILOL 12.5 MG PO TABS
12.5000 mg | ORAL_TABLET | Freq: Two times a day (BID) | ORAL | Status: DC
  Administered 2019-05-29 – 2019-05-31 (×4): 12.5 mg via ORAL
  Filled 2019-05-29 (×4): qty 1

## 2019-05-29 MED ORDER — HEPARIN BOLUS VIA INFUSION
1500.0000 [IU] | Freq: Once | INTRAVENOUS | Status: AC
  Administered 2019-05-29: 1500 [IU] via INTRAVENOUS
  Filled 2019-05-29: qty 1500

## 2019-05-29 MED ORDER — ALBUTEROL SULFATE (2.5 MG/3ML) 0.083% IN NEBU: 3.0000 mL | INHALATION_SOLUTION | Freq: Four times a day (QID) | RESPIRATORY_TRACT | Status: DC | PRN

## 2019-05-29 MED ORDER — MOMETASONE FURO-FORMOTEROL FUM 200-5 MCG/ACT IN AERO
2.0000 | INHALATION_SPRAY | Freq: Two times a day (BID) | RESPIRATORY_TRACT | Status: DC
  Administered 2019-05-29 – 2019-06-02 (×7): 2 via RESPIRATORY_TRACT
  Filled 2019-05-29 (×2): qty 8.8

## 2019-05-29 MED ORDER — TIZANIDINE HCL 2 MG PO TABS
1.0000 mg | ORAL_TABLET | Freq: Every day | ORAL | Status: DC
  Administered 2019-05-29 – 2019-05-30 (×2): 1 mg via ORAL
  Filled 2019-05-29 (×4): qty 0.5

## 2019-05-29 MED ORDER — GABAPENTIN 300 MG PO CAPS
600.0000 mg | ORAL_CAPSULE | Freq: Two times a day (BID) | ORAL | Status: DC
  Administered 2019-05-29 – 2019-06-02 (×10): 600 mg via ORAL
  Filled 2019-05-29 (×10): qty 2

## 2019-05-29 MED ORDER — LOSARTAN POTASSIUM 50 MG PO TABS
50.0000 mg | ORAL_TABLET | Freq: Every day | ORAL | Status: DC
  Administered 2019-05-29 – 2019-05-31 (×3): 50 mg via ORAL
  Filled 2019-05-29 (×3): qty 1

## 2019-05-29 MED ORDER — HYDROCODONE-ACETAMINOPHEN 5-325 MG PO TABS
1.0000 | ORAL_TABLET | ORAL | Status: DC | PRN
  Administered 2019-05-29 – 2019-06-01 (×13): 1 via ORAL
  Filled 2019-05-29 (×13): qty 1

## 2019-05-29 MED ORDER — HEPARIN BOLUS VIA INFUSION
2500.0000 [IU] | Freq: Once | INTRAVENOUS | Status: AC
  Administered 2019-05-29: 2500 [IU] via INTRAVENOUS
  Filled 2019-05-29: qty 2500

## 2019-05-29 MED ORDER — NICOTINE 21 MG/24HR TD PT24
21.0000 mg | MEDICATED_PATCH | Freq: Every day | TRANSDERMAL | Status: DC
  Administered 2019-05-29 – 2019-06-02 (×5): 21 mg via TRANSDERMAL
  Filled 2019-05-29 (×5): qty 1

## 2019-05-29 MED ORDER — PANTOPRAZOLE SODIUM 40 MG PO TBEC
40.0000 mg | DELAYED_RELEASE_TABLET | Freq: Every day | ORAL | Status: DC
  Administered 2019-05-29 – 2019-06-02 (×5): 40 mg via ORAL
  Filled 2019-05-29 (×5): qty 1

## 2019-05-29 MED ORDER — HEPARIN (PORCINE) 25000 UT/250ML-% IV SOLN
1000.0000 [IU]/h | INTRAVENOUS | Status: DC
  Administered 2019-05-29: 550 [IU]/h via INTRAVENOUS
  Administered 2019-05-30: 1000 [IU]/h via INTRAVENOUS
  Filled 2019-05-29 (×2): qty 250

## 2019-05-29 MED ORDER — DOCUSATE SODIUM 100 MG PO CAPS
100.0000 mg | ORAL_CAPSULE | Freq: Every day | ORAL | Status: DC
  Administered 2019-05-29 – 2019-06-02 (×5): 100 mg via ORAL
  Filled 2019-05-29 (×5): qty 1

## 2019-05-29 MED ORDER — CITALOPRAM HYDROBROMIDE 20 MG PO TABS
20.0000 mg | ORAL_TABLET | Freq: Every day | ORAL | Status: DC
  Administered 2019-05-29 – 2019-06-02 (×5): 20 mg via ORAL
  Filled 2019-05-29 (×2): qty 1
  Filled 2019-05-29: qty 2
  Filled 2019-05-29 (×2): qty 1

## 2019-05-29 MED ORDER — GUAIFENESIN-DM 100-10 MG/5ML PO SYRP: 15.0000 mL | ORAL_SOLUTION | ORAL | Status: DC | PRN

## 2019-05-29 MED ORDER — POTASSIUM CHLORIDE CRYS ER 10 MEQ PO TBCR
10.0000 meq | EXTENDED_RELEASE_TABLET | Freq: Every day | ORAL | Status: DC
  Administered 2019-05-29 – 2019-06-02 (×5): 10 meq via ORAL
  Filled 2019-05-29 (×5): qty 1

## 2019-05-29 MED ORDER — ALENDRONATE SODIUM 70 MG PO TABS: 70.0000 mg | ORAL_TABLET | ORAL | Status: DC

## 2019-05-29 NOTE — Plan of Care (Signed)

## 2019-05-29 NOTE — ED Notes (Signed)
After 0.4 mg SL nitro, pt's BP 97/56, held second nitro dose. Pt still endorses 8/10 chest pain.

## 2019-05-29 NOTE — ED Notes (Signed)
Breakfast ordered 

## 2019-05-29 NOTE — Progress Notes (Addendum)
[place] for heparin Indication: chest pain/ACS  Allergies  Allergen Reactions  . Amoxicillin Hives and Rash     Has patient had a PCN reaction causing immediate rash, facial/tongue/throat swelling, SOB or lightheadedness with hypotension: Yes, hives Has patient had a PCN reaction causing severe rash involving mucus membranes or skin necrosis: No Has patient had a PCN reaction that required hospitalization No Has patient had a PCN reaction occurring within the last 10 years: Yes If all of the above answers are "NO", then may proceed with Cephalosporin use.   . Clindamycin/Lincomycin Diarrhea and Other (See Comments)    Patient states that clindamycin causes diarrhea   . Ropinirole Other (See Comments)    "Made me insane" (per the patient)  . Wellbutrin [Bupropion] Diarrhea and Other (See Comments)    Lightheadedness, also  . Sulfamethoxazole Nausea And Vomiting and Rash  . Sulfonamide Derivatives Nausea And Vomiting and Rash    Patient Measurements: Height: 5\' 2"  (157.5 cm) IBW/kg (Calculated) : 50.1 Heparin Dosing Weight: 47.3 kg  Vital Signs: Temp: 98.9 F (37.2 C) (03/10 1423) Temp Source: Oral (03/10 1423) BP: 124/71 (03/10 1423) Pulse Rate: 101 (03/10 1423)  Labs: Recent Labs    05/28/19 2143 05/28/19 2143 05/28/19 2202 05/28/19 2345 05/29/19 0440 05/29/19 0706 05/29/19 1427  HGB 9.4*   < > 9.9*  --  9.7*  --   --   HCT 30.1*  --  29.0*  --  32.7*  --   --   PLT 492*  --   --   --  482*  --   --   LABPROT  --   --   --   --  15.9*  --   --   INR  --   --   --   --  1.3*  --   --   HEPARINUNFRC  --   --   --   --   --  <0.10* <0.10*  CREATININE 0.75  --  0.60  --  0.85  --   --   TROPONINIHS 3  --   --  4  --   --   --    < > = values in this interval not displayed.    CrCl cannot be calculated (Unknown ideal weight.).   Medical History: Past Medical History:  Diagnosis Date  . Alzheimer disease ([place])   .  Anxiety   . Bilateral pneumonia 01/2017   per new patient packet   . COPD (chronic obstructive pulmonary disease) ([place]) 01/2017   per new patient packet   . Depression   . Fatty liver   . Gallbladder disease 2014   per new patient packet   . Hernia, abdominal   . Hypertension   . Liver damage 01/2017   per new patient packet, Dr.Kadakia  . Normal cardiac stress test 2007  . Ovarian cyst 1977   8 1/2 lb left ovarian cyst, Dr.Cox, per new patient packet   . Pneumonia   . Right ovarian cyst    1992, 1993, 1994, 1995, and 1996 Dr.Neil, per new patient packet   . Stroke ([place])    4 strokes.    Assessment: 64 yo lady to start heparin for CP.  She was not on anticoagulation PTA -heparin level remains undetectable after increase to 700 units/hr   Goal of Therapy:  Heparin level 0.3-0.7 units/ml Monitor platelets by anticoagulation protocol: Yes   Plan:  -heparin bolus 1500  units x1 then increase to 850 units/hr -Heparin level in 6 hours and daily wth CBC daily  Harland German, PharmD Clinical Pharmacist **Pharmacist phone directory can now be found on [email or web address] ([email or web address]).  Listed under [facility].

## 2019-05-29 NOTE — ED Notes (Signed)
Called DR Algie Coffer to RN [place]--Michaelle Bottomley

## 2019-05-29 NOTE — ED Notes (Signed)
Admitting paged in reference to changing bed request to ICU if needing to remain on esmolol infusion. Algie Coffer, MD advised he would review chart and address disposition.

## 2019-05-29 NOTE — Progress Notes (Signed)
ANTICOAGULATION CONSULT NOTE   Pharmacy Consult for heparin Indication: chest pain/ACS  Allergies  Allergen Reactions  . Amoxicillin Hives and Rash     Has patient had a PCN reaction causing immediate rash, facial/tongue/throat swelling, SOB or lightheadedness with hypotension: Yes, hives Has patient had a PCN reaction causing severe rash involving mucus membranes or skin necrosis: No Has patient had a PCN reaction that required hospitalization No Has patient had a PCN reaction occurring within the last 10 years: Yes If all of the above answers are "NO", then may proceed with Cephalosporin use.   . Clindamycin/Lincomycin Diarrhea and Other (See Comments)    Patient states that clindamycin causes diarrhea   . Ropinirole Other (See Comments)    "Made me insane" (per the patient)  . Wellbutrin [Bupropion] Diarrhea and Other (See Comments)    Lightheadedness, also  . Sulfamethoxazole Nausea And Vomiting and Rash  . Sulfonamide Derivatives Nausea And Vomiting and Rash    Patient Measurements:   Heparin Dosing Weight: 47.3 kg  Vital Signs: Temp: 100.3 F (37.9 C) (03/09 2134) Temp Source: Oral (03/09 2134) BP: 105/64 (03/10 0730) Pulse Rate: 83 (03/10 0730)  Labs: Recent Labs    05/28/19 2143 05/28/19 2143 05/28/19 2202 05/28/19 2345 05/29/19 0440 05/29/19 0706  HGB 9.4*   < > 9.9*  --  9.7*  --   HCT 30.1*  --  29.0*  --  32.7*  --   PLT 492*  --   --   --  482*  --   LABPROT  --   --   --   --  15.9*  --   INR  --   --   --   --  1.3*  --   HEPARINUNFRC  --   --   --   --   --  <0.10*  CREATININE 0.75  --  0.60  --  0.85  --   TROPONINIHS 3  --   --  4  --   --    < > = values in this interval not displayed.    CrCl cannot be calculated (Unknown ideal weight.).   Medical History: Past Medical History:  Diagnosis Date  . Alzheimer disease (HCC)   . Anxiety   . Bilateral pneumonia 01/2017   per new patient packet   . COPD (chronic obstructive pulmonary  disease) (HCC) 01/2017   per new patient packet   . Depression   . Fatty liver   . Gallbladder disease 2014   per new patient packet   . Hernia, abdominal   . Hypertension   . Liver damage 01/2017   per new patient packet, Dr.Kadakia  . Normal cardiac stress test 2007  . Ovarian cyst 1977   8 1/2 lb left ovarian cyst, Dr.Cox, per new patient packet   . Pneumonia   . Right ovarian cyst    1992, 1993, 1994, 1995, and 1996 Dr.Neil, per new patient packet   . Stroke (HCC)    4 strokes.    Assessment: 64 yo lady to start heparin for CP.  She was not on anticoagulation PTA  Initial heparin level undetectable, confirmed rate, no issues with infusion.  Goal of Therapy:  Heparin level 0.3-0.7 units/ml Monitor platelets by anticoagulation protocol: Yes   Plan:  Increase heparin gtt to 700 units/hr F/u 6 hour heparin level  Daylene Posey, PharmD Clinical Pharmacist ED Pharmacist Phone # [phone] 05/29/2019 7:59 AM

## 2019-05-29 NOTE — Progress Notes (Signed)
Ref: Ngetich, Nelda Bucks, NP   Subjective:  Awake. Holding left side of chest for pain on palpation. Improved heart rate.   Objective:  Vital Signs in the last 24 hours: Temp:  [100.3 F (37.9 C)] 100.3 F (37.9 C) (03/09 2134) Pulse Rate:  [76-146] 82 (03/10 1000) Cardiac Rhythm: Normal sinus rhythm (03/10 0308) Resp:  [13-31] 18 (03/10 1000) BP: (90-128)/(52-83) 112/67 (03/10 1000) SpO2:  [80 %-99 %] 96 % (03/10 1000)  Physical Exam: BP Readings from Last 1 Encounters:  05/29/19 112/67     Wt Readings from Last 1 Encounters:  05/15/19 47.3 kg    Weight change:  There is no height or weight on file to calculate BMI. HEENT: [place]/AT, Eyes-Blue, PERL, EOMI, Conjunctiva-Pink, Sclera-Non-icteric Neck: No JVD, No bruit, Trachea midline. Back of left sided neck tender on palpation. Lungs:  Clear, Bilateral. Left sided chest wall tender on palpation. Cardiac:  Regular rhythm, normal S1 and S2, no S3. II/VI systolic murmur. Abdomen:  Soft, non-tender. BS present. Extremities:  No edema present. No cyanosis. No clubbing. CNS: AxOx3, Cranial nerves grossly intact, moves all 4 extremities.  Skin: Warm and dry.   Intake/Output from previous day: No intake/output data recorded.    Lab Results: BMET    Component Value Date/Time   NA 133 (L) 05/29/2019 0440   NA 131 (L) 05/28/2019 2202   NA 131 (L) 05/28/2019 2143   K 3.8 05/29/2019 0440   K 3.6 05/28/2019 2202   K 3.8 05/28/2019 2143   CL 99 05/29/2019 0440   CL 96 (L) 05/28/2019 2202   CL 98 05/28/2019 2143   CO2 20 (L) 05/29/2019 0440   CO2 23 05/28/2019 2143   CO2 23 05/13/2019 0105   GLUCOSE 113 (H) 05/29/2019 0440   GLUCOSE 132 (H) 05/28/2019 2202   GLUCOSE 135 (H) 05/28/2019 2143   BUN 10 05/29/2019 0440   BUN 12 05/28/2019 2202   BUN 11 05/28/2019 2143   CREATININE 0.85 05/29/2019 0440   CREATININE 0.60 05/28/2019 2202   CREATININE 0.75 05/28/2019 2143   CREATININE 0.68 09/20/2017 1030   CREATININE 0.73 07/18/2017  1414   CREATININE 0.60 03/08/2017 1231   CALCIUM 8.8 (L) 05/29/2019 0440   CALCIUM 8.6 (L) 05/28/2019 2143   CALCIUM 8.4 (L) 05/13/2019 0105   GFRNONAA >60 05/29/2019 0440   GFRNONAA >60 05/28/2019 2143   GFRNONAA >60 05/13/2019 0105   GFRNONAA 96 09/20/2017 1030   GFRNONAA 100 03/08/2017 1231   GFRNONAA >90 01/18/2011 1053   GFRAA >60 05/29/2019 0440   GFRAA >60 05/28/2019 2143   GFRAA >60 05/13/2019 0105   GFRAA 111 09/20/2017 1030   GFRAA 116 03/08/2017 1231   GFRAA >90 01/18/2011 1053   CBC    Component Value Date/Time   WBC 13.4 (H) 05/29/2019 0440   RBC 3.83 (L) 05/29/2019 0440   HGB 9.7 (L) 05/29/2019 0440   HCT 32.7 (L) 05/29/2019 0440   PLT 482 (H) 05/29/2019 0440   MCV 85.4 05/29/2019 0440   MCH 25.3 (L) 05/29/2019 0440   MCHC 29.7 (L) 05/29/2019 0440   RDW 18.1 (H) 05/29/2019 0440   LYMPHSABS 1.5 05/28/2019 2143   MONOABS 1.3 (H) 05/28/2019 2143   EOSABS 0.1 05/28/2019 2143   BASOSABS 0.1 05/28/2019 2143   HEPATIC Function Panel Recent Labs    01/05/19 2318 05/28/19 2143  PROT 5.2* 6.9   HEMOGLOBIN A1C No components found for: HGA1C,  MPG CARDIAC ENZYMES Lab Results  Component Value Date  CKTOTAL 38 09/20/2017   TROPONINI <0.03 11/28/2016   TROPONINI <0.30 07/11/2013   TROPONINI <0.30 02/28/2013   BNP No results for input(s): PROBNP in the last 8760 hours. TSH No results for input(s): TSH in the last 8760 hours. CHOLESTEROL Recent Labs    01/06/19 0628 05/29/19 0440  CHOL 127 102    Scheduled Meds: . aspirin EC  81 mg Oral Daily  . atorvastatin  10 mg Oral q1800  . carvedilol  12.5 mg Oral BID WC  . citalopram  20 mg Oral Daily  . docusate sodium  100 mg Oral Daily  . gabapentin  600 mg Oral BID  . losartan  50 mg Oral Daily  . mometasone-formoterol  2 puff Inhalation BID  . nicotine  21 mg Transdermal Daily  . pantoprazole  40 mg Oral Daily  . potassium chloride  10 mEq Oral Daily   Continuous Infusions: . 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 bid. Increase activity. Add Tizanidine small dose. Patient agrees to treat muscular chest pain with medications for now.      LOS: 1 day   Time spent including chart review, lab review, examination, discussion with patient :  min   Orpah Cobb  MD  05/29/2019, 10:26 AM

## 2019-05-29 NOTE — ED Notes (Signed)
Pt c/o 8/10 chest pain radiating to back.

## 2019-05-29 NOTE — Progress Notes (Signed)
ANTICOAGULATION CONSULT NOTE - Initial Consult  Pharmacy Consult for heparin Indication: chest pain/ACS  Allergies  Allergen Reactions  . Amoxicillin Hives and Rash     Has patient had a PCN reaction causing immediate rash, facial/tongue/throat swelling, SOB or lightheadedness with hypotension: Yes, hives Has patient had a PCN reaction causing severe rash involving mucus membranes or skin necrosis: No Has patient had a PCN reaction that required hospitalization No Has patient had a PCN reaction occurring within the last 10 years: Yes If all of the above answers are "NO", then may proceed with Cephalosporin use.   . Clindamycin/Lincomycin Diarrhea and Other (See Comments)    Patient states that clindamycin causes diarrhea   . Ropinirole Other (See Comments)    "Made me insane" (per the patient)  . Wellbutrin [Bupropion] Diarrhea and Other (See Comments)    Lightheadedness, also  . Sulfamethoxazole Nausea And Vomiting and Rash  . Sulfonamide Derivatives Nausea And Vomiting and Rash    Patient Measurements:   Heparin Dosing Weight: 47.3 kg  Vital Signs: Temp: 100.3 F (37.9 C) (03/09 2134) Temp Source: Oral (03/09 2134) BP: 111/55 (03/10 0000) Pulse Rate: 99 (03/10 0000)  Labs: Recent Labs    05/28/19 2143 05/28/19 2202  HGB 9.4* 9.9*  HCT 30.1* 29.0*  PLT 492*  --   CREATININE 0.75 0.60  TROPONINIHS 3  --     Estimated Creatinine Clearance: 55.1 mL/min (by C-G formula based on SCr of 0.6 mg/dL).   Medical History: Past Medical History:  Diagnosis Date  . Alzheimer disease (HCC)   . Anxiety   . Bilateral pneumonia 01/2017   per new patient packet   . COPD (chronic obstructive pulmonary disease) (HCC) 01/2017   per new patient packet   . Depression   . Fatty liver   . Gallbladder disease 2014   per new patient packet   . Hernia, abdominal   . Hypertension   . Liver damage 01/2017   per new patient packet, Dr.Kadakia  . Normal cardiac stress test 2007   . Ovarian cyst 1977   8 1/2 lb left ovarian cyst, Dr.Cox, per new patient packet   . Pneumonia   . Right ovarian cyst    1992, 1993, 1994, 1995, and 1996 Dr.Neil, per new patient packet   . Stroke (HCC)    4 strokes.     Medications:  See medication history  Assessment: 64 yo lady to start heparin for CP.  She was not on anticoagulation PTA Goal of Therapy:  Heparin level 0.3-0.7 units/ml Monitor platelets by anticoagulation protocol: Yes   Plan:  Heparin bolus 2500 units and drip at  550 units/hr Check heparin level 6 hours after start Daily HL and CBC Monitor for bleeding complications  Brittany [name] 05/29/2019,12:32 AM

## 2019-05-30 LAB — GLUCOSE, CAPILLARY
Glucose-Capillary: 109 mg/dL — ABNORMAL HIGH (ref 70–99)
Glucose-Capillary: 110 mg/dL — ABNORMAL HIGH (ref 70–99)
Glucose-Capillary: 132 mg/dL — ABNORMAL HIGH (ref 70–99)

## 2019-05-30 LAB — HEPARIN LEVEL (UNFRACTIONATED)
Heparin Unfractionated: <0.1 IU/mL — ABNORMAL LOW (ref 0.30–0.70)
Heparin Unfractionated: <0.1 IU/mL — ABNORMAL LOW (ref 0.30–0.70)

## 2019-05-30 MED ORDER — HEPARIN SODIUM (PORCINE) 5000 UNIT/ML IJ SOLN
5000.0000 [IU] | Freq: Three times a day (TID) | INTRAMUSCULAR | Status: DC
  Administered 2019-05-30 – 2019-06-02 (×8): 5000 [IU] via SUBCUTANEOUS
  Filled 2019-05-30 (×8): qty 1

## 2019-05-30 MED ORDER — DOXYCYCLINE HYCLATE 100 MG PO TABS
100.0000 mg | ORAL_TABLET | Freq: Two times a day (BID) | ORAL | Status: DC
  Administered 2019-05-30 – 2019-06-02 (×6): 100 mg via ORAL
  Filled 2019-05-30 (×6): qty 1

## 2019-05-30 MED ORDER — METFORMIN HCL 500 MG PO TABS
250.0000 mg | ORAL_TABLET | Freq: Every day | ORAL | Status: DC
  Administered 2019-05-30 – 2019-06-02 (×4): 250 mg via ORAL
  Filled 2019-05-30 (×4): qty 1

## 2019-05-30 MED ORDER — TIZANIDINE HCL 2 MG PO TABS
2.0000 mg | ORAL_TABLET | Freq: Three times a day (TID) | ORAL | Status: DC
  Administered 2019-05-30 – 2019-05-31 (×4): 2 mg via ORAL
  Filled 2019-05-30 (×6): qty 1

## 2019-05-30 MED ORDER — INSULIN ASPART 100 UNIT/ML ~~LOC~~ SOLN: 0.0000 [IU] | Freq: Three times a day (TID) | SUBCUTANEOUS | Status: DC

## 2019-05-30 MED ORDER — TIZANIDINE HCL 2 MG PO TABS: 2.0000 mg | ORAL_TABLET | Freq: Every day | ORAL | Status: DC

## 2019-05-30 MED ORDER — LIVING WELL WITH DIABETES BOOK
Freq: Once | Status: AC
  Filled 2019-05-30: qty 1

## 2019-05-30 NOTE — Plan of Care (Signed)
  Problem: Clinical Measurements: Goal: Respiratory complications will improve Outcome: Progressing Goal: Cardiovascular complication will be avoided Outcome: Progressing   

## 2019-05-30 NOTE — Plan of Care (Signed)

## 2019-05-30 NOTE — Progress Notes (Signed)
ANTICOAGULATION CONSULT NOTE   Pharmacy Consult for Heparin Indication: chest pain/ACS  Allergies  Allergen Reactions  . Amoxicillin Hives and Rash     Has patient had a PCN reaction causing immediate rash, facial/tongue/throat swelling, SOB or lightheadedness with hypotension: Yes, hives Has patient had a PCN reaction causing severe rash involving mucus membranes or skin necrosis: No Has patient had a PCN reaction that required hospitalization No Has patient had a PCN reaction occurring within the last 10 years: Yes If all of the above answers are "NO", then may proceed with Cephalosporin use.   . Clindamycin/Lincomycin Diarrhea and Other (See Comments)    Patient states that clindamycin causes diarrhea   . Ropinirole Other (See Comments)    "Made me insane" (per the patient)  . Wellbutrin [Bupropion] Diarrhea and Other (See Comments)    Lightheadedness, also  . Sulfamethoxazole Nausea And Vomiting and Rash  . Sulfonamide Derivatives Nausea And Vomiting and Rash    Patient Measurements: Height: 5\' 2"  (157.5 cm) IBW/kg (Calculated) : 50.1 Heparin Dosing Weight: 47.3 kg  Vital Signs: Temp: 100.2 F (37.9 C) (03/10 2120) Temp Source: Oral (03/10 2120) BP: 98/52 (03/10 2120) Pulse Rate: 90 (03/10 2120)  Labs: Recent Labs    05/28/19 2143 05/28/19 2143 05/28/19 2202 05/28/19 2202 05/28/19 2345 05/29/19 0440 05/29/19 0706 05/29/19 1427 05/29/19 2344  HGB 9.4*   < > 9.9*   < >  --  9.7*  --   --  9.5*  HCT 30.1*   < > 29.0*  --   --  32.7*  --   --  29.6*  PLT 492*  --   --   --   --  482*  --   --  472*  LABPROT  --   --   --   --   --  15.9*  --   --   --   INR  --   --   --   --   --  1.3*  --   --   --   HEPARINUNFRC  --   --   --   --   --   --  <0.10* <0.10* <0.10*  CREATININE 0.75  --  0.60  --   --  0.85  --   --   --   TROPONINIHS 3  --   --   --  4  --   --   --   --    < > = values in this interval not displayed.    CrCl cannot be calculated (Unknown  ideal weight.).   Medical History: Past Medical History:  Diagnosis Date  . Alzheimer disease (HCC)   . Anxiety   . Bilateral pneumonia 01/2017   per new patient packet   . COPD (chronic obstructive pulmonary disease) (HCC) 01/2017   per new patient packet   . Depression   . Fatty liver   . Gallbladder disease 2014   per new patient packet   . Hernia, abdominal   . Hypertension   . Liver damage 01/2017   per new patient packet, Dr.Kadakia  . Normal cardiac stress test 2007  . Ovarian cyst 1977   8 1/2 lb left ovarian cyst, Dr.Cox, per new patient packet   . Pneumonia   . Right ovarian cyst    1992, 1993, 1994, 1995, and 1996 Dr.Neil, per new patient packet   . Stroke (HCC)    4 strokes.    Assessment: 63  yo lady to start heparin for CP.  She was not on anticoagulation PTA -heparin level remains undetectable after increase to 700 units/hr  3/11 AM update:  Heparin level still undetectable No issues per RN   Goal of Therapy:  Heparin level 0.3-0.7 units/ml Monitor platelets by anticoagulation protocol: Yes   Plan:  Inc heparin to 1000 units/hr 1000 heparin level ?DC heparin-MD note mentions treating as musculoskeletal pain for now  Narda Bonds, PharmD, [place] Pharmacist Phone: [phone]

## 2019-05-30 NOTE — Progress Notes (Addendum)
Spoke to patient at the bedside about the new onset of diabetes. States that she was aware of the new diagnosis and had tried to see her PCP about it, but was unable to get an appointment.   Discussed HgbA1C of 6.7% up from 5.8% in October and the significance of the A1C and her blood sugars. States that she does drink sweet drinks-tea, sodas, juice. Eats a fair amount of carbohydrates. Discussed cutting back on the sweet drinks and carbs. States that her roommate does the cooking. Did not want to talk with a dietician. 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 Will provide patient with Living Well with Diabetes booklet, diabetes exit notes, and will need to follow up with a PCP after discharge.   Will need to get a home blood glucose meter, strips, and lancets. Physician can provide patient with a general prescription for home blood glucose meter kit (order # [id number]) and she can get one at her local pharmacy, perhaps one that is covered by her insurance.   Will continue to monitor blood sugars while in the hospital.  Harvel Ricks RN BSN CDE Diabetes Coordinator Pager: [phone]  8am-5pm

## 2019-05-30 NOTE — Progress Notes (Signed)
Ref: Brittany [name], Brittany C, NP   Subjective:  Sharp chest pain increased with breathing and moving continues otherwise resting most of the time.  Hgb A1C is increasing.   Objective:  Vital Signs in the last 24 hours: Temp:  [98.9 F (37.2 [name])-100.2 F (37.9 [name])] 99.1 F (37.3 [name]) (03/11 0343) Pulse Rate:  [90-101] 93 (03/11 0343) Cardiac Rhythm: Normal sinus rhythm (03/11 0820) Resp:  [20-25] 20 (03/11 0343) BP: (98-134)/(52-71) 134/62 (03/11 0343) SpO2:  [96 %-100 %] 98 % (03/11 0731) Weight:  [46.3 kg] 46.3 kg (03/11 0343)  Physical Exam: BP Readings from Last 1 Encounters:  05/30/19 134/62     Wt Readings from Last 1 Encounters:  05/30/19 46.3 kg    Weight change:  Body mass index is 18.66 kg/m. HEENT: [place]/AT, Eyes-Blue, PERL, EOMI, Conjunctiva-Pale pink, Sclera-Non-icteric Neck: No JVD, No bruit, Trachea midline. Lungs:  Clear, Bilateral. Left Chest wall and left sided mid back area tender on palpation. Cardiac:  Regular rhythm, normal S1 and S2, no S3. II/VI systolic murmur. Abdomen:  Soft, non-tender. BS present. Extremities:  No edema present. No cyanosis. No clubbing. Mild right arm swelling near IV site. CNS: AxOx3, Cranial nerves grossly intact, moves all 4 extremities.  Skin: Warm and dry.   Intake/Output from previous day: 03/10 0701 - 03/11 0700 In: 164.7 [I.V.:164.7] Out: -     Lab Results: BMET    Component Value Date/Time   NA 133 (L) 05/29/2019 0440   NA 131 (L) 05/28/2019 2202   NA 131 (L) 05/28/2019 2143   K 3.8 05/29/2019 0440   K 3.6 05/28/2019 2202   K 3.8 05/28/2019 2143   CL 99 05/29/2019 0440   CL 96 (L) 05/28/2019 2202   CL 98 05/28/2019 2143   CO2 20 (L) 05/29/2019 0440   CO2 23 05/28/2019 2143   CO2 23 05/13/2019 0105   GLUCOSE 113 (H) 05/29/2019 0440   GLUCOSE 132 (H) 05/28/2019 2202   GLUCOSE 135 (H) 05/28/2019 2143   BUN 10 05/29/2019 0440   BUN 12 05/28/2019 2202   BUN 11 05/28/2019 2143   CREATININE 0.85 05/29/2019 0440   CREATININE 0.60 05/28/2019 2202   CREATININE 0.75 05/28/2019 2143   CREATININE 0.68 09/20/2017 1030   CREATININE 0.73 07/18/2017 1414   CREATININE 0.60 03/08/2017 1231   CALCIUM 8.8 (L) 05/29/2019 0440   CALCIUM 8.6 (L) 05/28/2019 2143   CALCIUM 8.4 (L) 05/13/2019 0105   GFRNONAA >60 05/29/2019 0440   GFRNONAA >60 05/28/2019 2143   GFRNONAA >60 05/13/2019 0105   GFRNONAA 96 09/20/2017 1030   GFRNONAA 100 03/08/2017 1231   GFRNONAA >90 01/18/2011 1053   GFRAA >60 05/29/2019 0440   GFRAA >60 05/28/2019 2143   GFRAA >60 05/13/2019 0105   GFRAA 111 09/20/2017 1030   GFRAA 116 03/08/2017 1231   GFRAA >90 01/18/2011 1053   CBC    Component Value Date/Time   WBC 12.4 (H) 05/29/2019 2344   RBC 3.68 (L) 05/29/2019 2344   HGB 9.5 (L) 05/29/2019 2344   HCT 29.6 (L) 05/29/2019 2344   PLT 472 (H) 05/29/2019 2344   MCV 80.4 05/29/2019 2344   MCH 25.8 (L) 05/29/2019 2344   MCHC 32.1 05/29/2019 2344   RDW 17.9 (H) 05/29/2019 2344   LYMPHSABS 1.5 05/28/2019 2143   MONOABS 1.3 (H) 05/28/2019 2143   EOSABS 0.1 05/28/2019 2143   BASOSABS 0.1 05/28/2019 2143   HEPATIC Function Panel Recent Labs    01/05/19 2318 05/28/19 2143  PROT 5.2* 6.9   HEMOGLOBIN A1C No components found for: HGA1C,  MPG CARDIAC ENZYMES Lab Results  Component Value Date   CKTOTAL 38 09/20/2017   TROPONINI <0.03 11/28/2016   TROPONINI <0.30 07/11/2013   TROPONINI <0.30 02/28/2013   BNP No results for input(s): PROBNP in the last 8760 hours. TSH No results for input(s): TSH in the last 8760 hours. CHOLESTEROL Recent Labs    01/06/19 0628 05/29/19 0440  CHOL 127 102    Scheduled Meds: . aspirin EC  81 mg Oral Daily  . atorvastatin  10 mg Oral q1800  . carvedilol  12.5 mg Oral BID WC  . citalopram  20 mg Oral Daily  . docusate sodium  100 mg Oral Daily  . gabapentin  600 mg Oral BID  . heparin  5,000 Units Subcutaneous Q8H  . insulin aspart  0-9 Units Subcutaneous TID WC  . losartan  50 mg Oral  Daily  . metFORMIN  250 mg Oral Q breakfast  . mometasone-formoterol  2 puff Inhalation BID  . nicotine  21 mg Transdermal Daily  . pantoprazole  40 mg Oral Daily  . potassium chloride  10 mEq Oral Daily  . tiZANidine  2 mg Oral TID   Continuous Infusions: PRN Meds:.acetaminophen, albuterol, guaiFENesin-dextromethorphan, HYDROcodone-acetaminophen, nitroGLYCERIN, ondansetron (ZOFRAN) IV  Assessment/Plan: Chest pain, musculoskeletal HTN Type 2 DM, new Anemia of blood loss Moderate protein calorie malnutrition TIA H/O stroke Tobacco use disorder  Increase Tizanidine dose. Diabetic teaching. Start with very low metformin dose. Increase Activity. Discontinue IV heaprin   LOS: 2 days   Time spent including chart review, lab review, examination, discussion with patient : 30 min   Dixie Dials  MD  05/30/2019, 11:06 AM

## 2019-05-31 LAB — GLUCOSE, CAPILLARY
Glucose-Capillary: 126 mg/dL — ABNORMAL HIGH (ref 70–99)
Glucose-Capillary: 123 mg/dL — ABNORMAL HIGH (ref 70–99)
Glucose-Capillary: 152 mg/dL — ABNORMAL HIGH (ref 70–99)
Glucose-Capillary: 106 mg/dL — ABNORMAL HIGH (ref 70–99)

## 2019-05-31 MED ORDER — TIZANIDINE HCL 2 MG PO TABS
2.0000 mg | ORAL_TABLET | Freq: Two times a day (BID) | ORAL | Status: DC
  Administered 2019-05-31 – 2019-06-02 (×4): 2 mg via ORAL
  Filled 2019-05-31 (×5): qty 1

## 2019-05-31 NOTE — Progress Notes (Signed)
Ref: Ngetich, Brittany Bucks, NP   Subjective:  Awake. Sharp chest pain and right arm swelling with IV infiltration giving severe pain. Patient living home alone and can not do daily living acts with one hand. She does not have any family member to live with for 1 week.  Objective:  Vital Signs in the last 24 hours: Temp:  [99.3 F (37.4 C)-99.7 F (37.6 C)] 99.3 F (37.4 C) (03/12 0503) Pulse Rate:  [80-96] 96 (03/12 0503) Cardiac Rhythm: Normal sinus rhythm (03/12 0850) Resp:  [20] 20 (03/12 0503) BP: (92-151)/(56-70) 151/70 (03/12 0503) SpO2:  [97 %-98 %] 98 % (03/12 0503) Weight:  [46.4 kg] 46.4 kg (03/12 0519)  Physical Exam: BP Readings from Last 1 Encounters:  05/31/19 (!) 151/70     Wt Readings from Last 1 Encounters:  05/31/19 46.4 kg    Weight change: 0.133 kg Body mass index is 18.71 kg/m. HEENT: [place]/AT, Eyes-Blue, PERL, EOMI, Conjunctiva-Pink, Sclera-Non-icteric Neck: No JVD, No bruit, Trachea midline. Lungs:  Clear, Bilateral. Left chest wall tender. Cardiac:  Regular rhythm, normal S1 and S2, no S3. II/VI systolic murmur. Abdomen:  Soft, non-tender. BS present. Extremities:  No edema present. No cyanosis. No clubbing. Right arm swelling covering 1/4th of forearm, more on medial aspect. Radial pulse is 2 + and moving fingers individually is painless except ring finger motion. CNS: AxOx3, Cranial nerves grossly intact, moves all 4 extremities but decreased extension of right arm by 50 %..  Skin: Warm and dry.   Intake/Output from previous day: 03/11 0701 - 03/12 0700 In: 240 [P.O.:240] Out: -     Lab Results: BMET    Component Value Date/Time   NA 133 (L) 05/29/2019 0440   NA 131 (L) 05/28/2019 2202   NA 131 (L) 05/28/2019 2143   K 3.8 05/29/2019 0440   K 3.6 05/28/2019 2202   K 3.8 05/28/2019 2143   CL 99 05/29/2019 0440   CL 96 (L) 05/28/2019 2202   CL 98 05/28/2019 2143   CO2 20 (L) 05/29/2019 0440   CO2 23 05/28/2019 2143   CO2 23 05/13/2019 0105    GLUCOSE 113 (H) 05/29/2019 0440   GLUCOSE 132 (H) 05/28/2019 2202   GLUCOSE 135 (H) 05/28/2019 2143   BUN 10 05/29/2019 0440   BUN 12 05/28/2019 2202   BUN 11 05/28/2019 2143   CREATININE 0.85 05/29/2019 0440   CREATININE 0.60 05/28/2019 2202   CREATININE 0.75 05/28/2019 2143   CREATININE 0.68 09/20/2017 1030   CREATININE 0.73 07/18/2017 1414   CREATININE 0.60 03/08/2017 1231   CALCIUM 8.8 (L) 05/29/2019 0440   CALCIUM 8.6 (L) 05/28/2019 2143   CALCIUM 8.4 (L) 05/13/2019 0105   GFRNONAA >60 05/29/2019 0440   GFRNONAA >60 05/28/2019 2143   GFRNONAA >60 05/13/2019 0105   GFRNONAA 96 09/20/2017 1030   GFRNONAA 100 03/08/2017 1231   GFRNONAA >90 01/18/2011 1053   GFRAA >60 05/29/2019 0440   GFRAA >60 05/28/2019 2143   GFRAA >60 05/13/2019 0105   GFRAA 111 09/20/2017 1030   GFRAA 116 03/08/2017 1231   GFRAA >90 01/18/2011 1053   CBC    Component Value Date/Time   WBC 12.4 (H) 05/29/2019 2344   RBC 3.68 (L) 05/29/2019 2344   HGB 9.5 (L) 05/29/2019 2344   HCT 29.6 (L) 05/29/2019 2344   PLT 472 (H) 05/29/2019 2344   MCV 80.4 05/29/2019 2344   MCH 25.8 (L) 05/29/2019 2344   MCHC 32.1 05/29/2019 2344   RDW 17.9 (H)  05/29/2019 2344   LYMPHSABS 1.5 05/28/2019 2143   MONOABS 1.3 (H) 05/28/2019 2143   EOSABS 0.1 05/28/2019 2143   BASOSABS 0.1 05/28/2019 2143   HEPATIC Function Panel Recent Labs    01/05/19 2318 05/28/19 2143  PROT 5.2* 6.9   HEMOGLOBIN A1C No components found for: HGA1C,  MPG CARDIAC ENZYMES Lab Results  Component Value Date   CKTOTAL 38 09/20/2017   TROPONINI <0.03 11/28/2016   TROPONINI <0.30 07/11/2013   TROPONINI <0.30 02/28/2013   BNP No results for input(s): PROBNP in the last 8760 hours. TSH No results for input(s): TSH in the last 8760 hours. CHOLESTEROL Recent Labs    01/06/19 0628 05/29/19 0440  CHOL 127 102    Scheduled Meds: . aspirin EC  81 mg Oral Daily  . atorvastatin  10 mg Oral q1800  . carvedilol  12.5 mg Oral BID WC   . citalopram  20 mg Oral Daily  . docusate sodium  100 mg Oral Daily  . doxycycline  100 mg Oral BID  . gabapentin  600 mg Oral BID  . heparin  5,000 Units Subcutaneous Q8H  . insulin aspart  0-9 Units Subcutaneous TID WC  . losartan  50 mg Oral Daily  . metFORMIN  250 mg Oral Q breakfast  . mometasone-formoterol  2 puff Inhalation BID  . nicotine  21 mg Transdermal Daily  . pantoprazole  40 mg Oral Daily  . potassium chloride  10 mEq Oral Daily  . tiZANidine  2 mg Oral TID   Continuous Infusions: PRN Meds:.acetaminophen, albuterol, guaiFENesin-dextromethorphan, HYDROcodone-acetaminophen, nitroGLYCERIN, ondansetron (ZOFRAN) IV  Assessment/Plan: Chest pain, musculoskeletal Right arm swelling and pain HTN Type 2 DM Anemia of blood loss Moderate protein calorie malnutrition TIA H/O stroke Tobacco use disorder  Continue pain management. Increase activity. Ice pack to right arm and heating pad to back as needed. Dr. Sharyn Lull covering for weekend.   LOS: 3 days   Time spent including chart review, lab review, examination, discussion with patient : 30 min   Orpah Cobb  MD  05/31/2019, 4:25 PM

## 2019-05-31 NOTE — Plan of Care (Signed)

## 2019-05-31 NOTE — Significant Event (Addendum)
Rapid Response Event Note  Overview: Time Called: 1721 Arrival Time: 1725 Event Type: Hypotension 1700, pt BP noted to be 61/40 (48). RN notified attending MD, due to limited IV access and infiltrate, MD recommended increasing pt's oral fluid intake and discontinued pt's Coreg and Cozaar.   Initial Focused Assessment: Pt lying in bed. Awake, alert & oriented. Pt able to follow commands and purposefully moves all extremities. PERRLA, 5mm. Pt has had no change in mentation, per primary RN. Primary RN verified BP with manual check. Pt color is pink & she is warm/dry to touch. Denies lightheadedness, dizziness or headache. Pulses 2+ palpable in all extremities. Lung sounds are clear. No adventitious heart sounds. Pt denies pain. Abdomen is soft.  Interventions: -PIV insertion without success, pt refused additional attempts -BP cuff moved to pt's left leg and BP at this location reads 95/52 (64)  Plan of Care (if not transferred): -Trend BP q15 minutes x1 hour - if no improvement in BP or if pt becomes symptomatic (dizziness, nausea, lightheadedness, decreased LOC, or AMS) notify RR RN -Encourage oral fluid intake -Ensure pt is voiding and document output -Bedrest until BP is back to pt's baseline -Recommend removal of right hand IV d/t infiltrate site being above IV- consult IV team if unable to get IV access, pt should have at least one IV  Call rapid response for further needs.  Event Summary: Name of Physician Notified: Dr. Algie Coffer at [facility] notified and spoke with MD prior to my arrival) Outcome: Stayed in room and stabalized Event End Time: 1800  Jennye Moccasin

## 2019-05-31 NOTE — TOC Initial Note (Signed)
Transition of Care [facility]) - Initial/Assessment Note    Patient Details  Name: Brittany [name] MRN: [id number] Date of Birth: October 11, 1955  Transition of [facility]) CM/SW Contact:    Bethena Roys, RN Phone Number: 05/31/2019, 12:45 PM  Clinical Narrative:  Patient presented for Chest pain- prior to arrival patient was independent. Patient states she uses [facility] for medications- no problems affording medications. Patient states her sister takes her to all appointments. Roommate picks up all the groceries. Patient has been ambulating in the room without any problems. No home health needs identified at this time. Case Manager will follow for additional transition of care needs.             Expected Discharge Plan: Home/Self Care Barriers to Discharge: No Barriers Identified   Patient Goals and CMS Choice Patient states their goals for this hospitalization and ongoing recovery are:: "to return home"   Choice offered to / list presented to : NA  Expected Discharge Plan and Services Expected Discharge Plan: Home/Self Care In-house Referral: NA Discharge Planning Services: CM Consult Post Acute Care Choice: NA Living arrangements for the past 2 months: Single Family Home                           HH Arranged: NA [place] Agency: NA        Prior Living Arrangements/Services Living arrangements for the past 2 months: Single Family Home Lives with:: Roommate Patient language and need for interpreter reviewed:: Yes Do you feel safe going back to the place where you live?: Yes      Need for Family Participation in Patient Care: Yes (Comment) Care giver support system in place?: Yes (comment) Current home services: DME(Patient has a rolling walker) Criminal Activity/Legal Involvement Pertinent to Current Situation/Hospitalization: No - Comment as needed  Activities of Daily Living Home Assistive Devices/Equipment: Dentures (specify type) ADL Screening (condition at  time of admission) Patient's cognitive ability adequate to safely complete daily activities?: Yes Is the patient deaf or have difficulty hearing?: No Does the patient have difficulty seeing, even when wearing glasses/contacts?: No Does the patient have difficulty concentrating, remembering, or making decisions?: No Patient able to express need for assistance with ADLs?: No Does the patient have difficulty dressing or bathing?: No Independently performs ADLs?: No Communication: Independent Dressing (OT): Independent Grooming: Independent Feeding: Independent Bathing: Independent Toileting: Independent In/Out Bed: Independent Walks in Home: Independent Does the patient have difficulty walking or climbing stairs?: No Weakness of Legs: None Weakness of Arms/Hands: None  Permission Sought/Granted Permission sought to share information with : Family Supports(Sister and roommate supports the patient.)                Emotional Assessment Appearance:: Appears stated age Attitude/Demeanor/Rapport: Engaged Affect (typically observed): Pleasant Orientation: : Oriented to Place, Oriented to  Time, Oriented to Situation Alcohol / Substance Use: Not Applicable Psych Involvement: No (comment)  Admission diagnosis:  Precordial pain [R07.2] Acute coronary syndrome [facility]) [I24.9] Patient Active Problem List   Diagnosis Date Noted  . Acute coronary syndrome ([place]) 05/28/2019  . Thoracic aortic aneurysm, ruptured ([place]) 05/11/2019  . Cerebrovascular accident (CVA) ([place]) 02/19/2019  . Lacunar infarct, acute ([place]) 01/05/2019  . Altered mental status associated with intoxication ([place]) 01/05/2019  . Hypotension 01/05/2019  . Left wrist fracture 01/05/2019  . AKI (acute kidney injury) ([place]) 01/05/2019  . Restless leg syndrome 07/10/2018  . Peripheral neuropathy 07/10/2018  . COPD (chronic obstructive  pulmonary disease) (HCC) 10/05/2017  . GERD (gastroesophageal reflux disease) 10/05/2017  .  Depression 10/05/2017  . Facial cellulitis-left 10/05/2017  . Diarrhea 10/05/2017  . Chronic viral hepatitis C (HCC) 06/20/2017  . Idiopathic peripheral neuropathy 05/19/2017  . Chronic low back pain without sciatica 05/19/2017  . Hyperglycemia 05/19/2017  . Chronic bronchitis (HCC) 03/08/2017  . Elevated LFTs 03/08/2017  . 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 GAD (generalized anxiety disorder) 03/08/2017  . Hepatomegaly 03/08/2017  . Acute exacerbation of chronic obstructive pulmonary disease (COPD) (HCC) 02/16/2017  . Acute respiratory failure with hypoxia (HCC)   . COPD with acute exacerbation (HCC) 01/27/2017  . CAP (community acquired pneumonia) 01/27/2017  . Tachycardia 01/27/2017  . COPD exacerbation (HCC) 01/27/2017  . Hypokalemia 01/27/2017  . Left arm numbness 11/28/2016    Class: Chronic  . Nondisplaced transverse fracture of left patella, initial encounter for closed fracture 06/10/2016  . Malnutrition of moderate degree (HCC) 07/12/2013  . Syncope 07/11/2013  . GASTROENTERITIS 05/07/2010  . TOBACCO ABUSE 07/01/2009  . Benign essential HTN 06/24/2009  . BRONCHITIS, ACUTE 06/24/2009   PCP:  Caesar Bookman, NP Pharmacy:   [id number] [place], [place] - [phone] [place] [address] Phone: [phone] Fax: [phone]     Social Determinants of Health (SDOH) Interventions    Readmission Risk Interventions Readmission Risk Prevention Plan 05/31/2019  Transportation Screening Complete  PCP or Specialist Appt within 3-5 Days Complete  HRI or Home Care Consult Complete  Social Work Consult for Recovery Care Planning/Counseling Complete  Palliative Care Screening Not Applicable  Medication Review [employer]) Complete  Some recent data might be hidden

## 2019-06-01 LAB — GLUCOSE, CAPILLARY
Glucose-Capillary: 113 mg/dL — ABNORMAL HIGH (ref 70–99)
Glucose-Capillary: 93 mg/dL (ref 70–99)
Glucose-Capillary: 94 mg/dL (ref 70–99)
Glucose-Capillary: 91 mg/dL (ref 70–99)

## 2019-06-01 LAB — CBC
HCT: 30.3 % — ABNORMAL LOW (ref 36.0–46.0)
Hemoglobin: 9.5 g/dL — ABNORMAL LOW (ref 12.0–15.0)
MCH: 25.7 pg — ABNORMAL LOW (ref 26.0–34.0)
MCHC: 31.4 g/dL (ref 30.0–36.0)
MCV: 81.9 fL (ref 80.0–100.0)
Platelets: 456 10*3/uL — ABNORMAL HIGH (ref 150–400)
RBC: 3.7 MIL/uL — ABNORMAL LOW (ref 3.87–5.11)
RDW: 18.4 % — ABNORMAL HIGH (ref 11.5–15.5)
WBC: 11.2 10*3/uL — ABNORMAL HIGH (ref 4.0–10.5)
nRBC: 0 % (ref 0.0–0.2)

## 2019-06-01 LAB — BASIC METABOLIC PANEL
Anion gap: 13 (ref 5–15)
BUN: 19 mg/dL (ref 8–23)
CO2: 21 mmol/L — ABNORMAL LOW (ref 22–32)
Calcium: 8.7 mg/dL — ABNORMAL LOW (ref 8.9–10.3)
Chloride: 100 mmol/L (ref 98–111)
Creatinine, Ser: 0.85 mg/dL (ref 0.44–1.00)
GFR calc Af Amer: >60 mL/min (ref >60)
GFR calc non Af Amer: >60 mL/min (ref >60)
Glucose, Bld: 105 mg/dL — ABNORMAL HIGH (ref 70–99)
Potassium: 4 mmol/L (ref 3.5–5.1)
Sodium: 134 mmol/L — ABNORMAL LOW (ref 135–145)

## 2019-06-01 MED ORDER — OXYCODONE-ACETAMINOPHEN 5-325 MG PO TABS
1.0000 | ORAL_TABLET | ORAL | Status: DC | PRN
  Administered 2019-06-01 – 2019-06-02 (×5): 2 via ORAL
  Filled 2019-06-01 (×5): qty 2

## 2019-06-01 MED ORDER — OXYCODONE-ACETAMINOPHEN 5-325 MG PO TABS
1.0000 | ORAL_TABLET | ORAL | Status: DC | PRN
  Administered 2019-06-01 (×2): 1 via ORAL
  Filled 2019-06-01 (×2): qty 1

## 2019-06-01 NOTE — Progress Notes (Signed)
Subjective:  Patient denies any chest pain or shortness of breath.  Complains of right forearm swelling due to infiltration of the IV.  No further episodes of hypotension.  Objective:  Vital Signs in the last 24 hours: Temp:  [98.3 F (36.8 C)-101.3 F (38.5 C)] 98.5 F (36.9 C) (03/13 0757) Pulse Rate:  [76-98] 80 (03/13 0757) Resp:  [18-20] 18 (03/13 0757) BP: (93-125)/(45-84) 125/69 (03/13 0755) SpO2:  [96 %-100 %] 99 % (03/13 0757) Weight:  [46.5 kg] 46.5 kg (03/13 0406)  Intake/Output from previous day: 03/12 0701 - 03/13 0700 In: 360 [P.O.:360] Out: -  Intake/Output from this shift: No intake/output data recorded.  Physical Exam: Neck: no adenopathy, no carotid bruit, no JVD and supple, symmetrical, trachea midline Lungs: clear to auscultation bilaterally Heart: regular rate and rhythm, S1, S2 normal and 2/6 systolic murmur noted Abdomen: soft, non-tender; bowel sounds normal; no masses,  no organomegaly and Surgical incision and right lower quadrant healing well Extremities: extremities normal, atraumatic, no cyanosis or edema  Lab Results: Recent Labs    05/29/19 2344 06/01/19 0218  WBC 12.4* 11.2*  HGB 9.5* 9.5*  PLT 472* 456*   Recent Labs    06/01/19 0218  NA 134*  K 4.0  CL 100  CO2 21*  GLUCOSE 105*  BUN 19  CREATININE 0.85   No results for input(s): TROPONINI in the last 72 hours.  Invalid input(s): CK, MB Hepatic Function Panel No results for input(s): PROT, ALBUMIN, AST, ALT, ALKPHOS, BILITOT, BILIDIR, IBILI in the last 72 hours. No results for input(s): CHOL in the last 72 hours. No results for input(s): PROTIME in the last 72 hours.  Imaging: Imaging results have been reviewed and No results found.  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

## 2019-06-01 NOTE — Progress Notes (Signed)
Patient continues to c/o severe pain to right forearm. Right FA circumference is 24.8 cm, left FA circumference is 19 cm.  Dr. Sharyn Lull assessed arm earlier this morning. I received order from Dr. Algie Coffer for mildly stronger pain medication; will assess response.

## 2019-06-02 LAB — GLUCOSE, CAPILLARY: Glucose-Capillary: 101 mg/dL — ABNORMAL HIGH (ref 70–99)

## 2019-06-02 MED ORDER — DOXYCYCLINE HYCLATE 100 MG PO TABS: 100.0000 mg | ORAL_TABLET | Freq: Two times a day (BID) | ORAL | 0 refills | Status: DC

## 2019-06-02 MED ORDER — METOPROLOL TARTRATE 25 MG PO TABS: 12.5000 mg | ORAL_TABLET | Freq: Two times a day (BID) | ORAL | 3 refills | Status: DC

## 2019-06-02 MED ORDER — METFORMIN HCL 500 MG PO TABS: 250.0000 mg | ORAL_TABLET | Freq: Every day | ORAL | 3 refills | Status: DC

## 2019-06-02 NOTE — Discharge Instructions (Signed)
Angina  Angina is extreme discomfort in the chest, neck, arm, jaw, or back. The discomfort is caused by a lack of blood in the middle layer of the heart wall (myocardium). There are four types of angina:  Stable angina. This is triggered by vigorous activity or exercise. It goes away when you rest or take angina medicine.  Unstable angina. This is a warning sign and can lead to a heart attack (acute coronary syndrome). This is a medical emergency. Symptoms come at rest and last a long time.  Microvascular angina. This affects the small coronary arteries. Symptoms include feeling tired and being short of breath.  Prinzmetal or variant angina. This is caused by a tightening (spasm) of the arteries that go to your heart. What are the causes? This condition is caused by atherosclerosis. This is the buildup of fat and cholesterol (plaque) in your arteries. The plaque may narrow or block the artery. Other causes of angina include:  Sudden tightening of the muscles of the arteries in the heart (coronary spasm).  Small artery disease (microvascular dysfunction).  Problems with any of your heart valves (heart valve disease).  A tear in an artery in your heart (coronary artery dissection).  Diseases of the heart muscle (cardiomyopathy), or other heart diseases. What increases the risk? You are more likely to develop this condition if you have:  High cholesterol.  High blood pressure (hypertension).  Diabetes.  A family history of heart disease.  An inactive (sedentary) lifestyle, or you do not exercise enough.  Depression.  Had radiation treatment to the left side of your chest. Other risk factors include:  Using tobacco.  Being obese.  Eating a diet high in saturated fats.  Being exposed to high stress or triggers of stress.  Using drugs, such as cocaine. Women have a greater risk for angina if:  They are older than 55.  They have gone through menopause (are  postmenopausal). What are the signs or symptoms? Common symptoms of this condition in both men and women may include:  Chest pain, which may: ? Feel like a crushing or squeezing in the chest, or like a tightness, pressure, fullness, or heaviness in the chest. ? Last for more than a few minutes at a time, or it may stop and come back (recur) over the course of a few minutes.  Pain in the neck, arm, jaw, or back.  Unexplained heartburn or indigestion.  Shortness of breath.  Nausea.  Sudden cold sweats. Women and people with diabetes may have unusual (atypical) symptoms, such as:  Fatigue.  Unexplained feelings of nervousness or anxiety.  Unexplained weakness.  Dizziness or fainting. How is this diagnosed? This condition may be diagnosed based on:  Your symptoms and medical history.  Electrocardiogram (ECG) to measure the electrical activity in your heart.  Blood tests.  Stress test to look for signs of blockage when your heart is stressed.  CT angiogram to examine your heart and the blood flow to it.  Coronary angiogram to check your coronary arteries for blockage. How is this treated? Angina may be treated with:  Medicines to: ? Prevent blood clots and heart attack. ? Relax blood vessels and improve blood flow to the heart (nitrates). ? Reduce blood pressure, improve the pumping action of the heart, and relax blood vessels that are spasming. ? Reduce cholesterol and help treat atherosclerosis.  A procedure to widen a narrowed or blocked coronary artery (angioplasty). A mesh tube may be placed in a coronary artery to   keep it open (coronary stenting).  Surgery to allow blood to go around a blocked artery (coronary artery bypass surgery). Follow these instructions at home: Medicines  Take over-the-counter and prescription medicines only as told by your health care provider.  Do not take the following medicines unless your health care provider approves: ? NSAIDs,  such as ibuprofen or naproxen. ? Vitamin supplements that contain vitamin A, vitamin E, or both. ? Hormone replacement therapy that contains estrogen with or without progestin. Eating and drinking   Eat a heart-healthy diet. This includes plenty of fresh fruits and vegetables, whole grains, low-fat (lean) protein, and low-fat dairy products.  Follow instructions from your health care provider about eating or drinking restrictions. Activity  Follow an exercise program approved by your health care provider.  Consider joining a cardiac rehabilitation program.  Take a break when you feel fatigued. Plan rest periods in your daily activities. Lifestyle   Do not use any products that contain nicotine or tobacco, such as cigarettes, e-cigarettes, and chewing tobacco. If you need help quitting, ask your health care provider.  If your health care provider says you can drink alcohol: ? Limit how much you use to:  0-1 drink a day for nonpregnant women.  0-2 drinks a day for men. ? Be aware of how much alcohol is in your drink. In the [place] one drink equals one 12 oz bottle of beer (355 mL), one 5 oz glass of wine (148 mL), or one 1 oz glass of hard liquor (44 mL). General instructions  Maintain a healthy weight.  Learn to manage stress.  Keep your vaccinations up to date. Get the flu (influenza) vaccine every year.  Talk to your health care provider if you feel depressed. Take a depression screening test to see if you are at risk for depression.  Work with your health care provider to manage other health conditions, such as hypertension or diabetes.  Keep all follow-up visits as told by your health care provider. This is important. Get help right away if:  You have pain in your chest, neck, arm, jaw, or back, and the pain: ? Lasts more than a few minutes. ? Is recurring. ? Is not relieved by taking medicines under the tongue (sublingual nitroglycerin). ? Increases in intensity or  frequency.  You have a lot of sweating without cause.  You have unexplained: ? Heartburn or indigestion. ? Shortness of breath or difficulty breathing. ? Nausea or vomiting. ? Fatigue. ? Feelings of nervousness or anxiety. ? Weakness.  You have sudden light-headedness or dizziness.  You faint. These symptoms may represent a serious problem that is an emergency. Do not wait to see if the symptoms will go away. Get medical help right away. Call your local emergency services (911 in the [place] Do not drive yourself to the hospital. Summary  Angina is extreme discomfort in the chest, neck, arm, jaw, or back that is caused by a lack of blood in the heart wall.  There are many symptoms of angina. They include chest pain, unexplained heartburn or indigestion, sudden cold sweats, and fatigue.  Angina may be treated with behavioral changes, medicine, or surgery.  Symptoms of angina may represent an emergency. Get medical help right away. Call your local emergency services (911 in the [place] Do not drive yourself to the hospital. This information is not intended to replace advice given to you by your health care provider. Make sure you discuss any questions you have with your health care provider.   Document Revised: 10/23/2017 Document Reviewed: 10/23/2017 Elsevier Patient Education  2020 Elsevier Inc.  

## 2019-06-02 NOTE — Discharge Summary (Signed)
Discharge summary dictated on 06/02/2019 dictation number is [phone]

## 2019-06-03 ENCOUNTER — Telehealth: Payer: Self-pay | Admitting: *Deleted

## 2019-06-03 NOTE — Discharge Summary (Signed)
Brittany [name], [place] MEDICAL RECORD [id number] ACCOUNT [id number] DATE OF BIRTH:04/12/1955 FACILITY: [facility] LOCATION: [facility] PHYSICIAN:Kenneith Stief Jeoffrey Massed, MD  DISCHARGE SUMMARY  DATE OF DISCHARGE:  06/02/2019  ADMITTING DIAGNOSES: 1.  Acute coronary syndrome. 2.  Sinus tachycardia. 3.  Hypertension. 4.  New onset diabetes mellitus. 5.  Tobacco use disorder. 6.  Recent aortic dissection with endograft stent placement. 7.  Anemia. 8.  Moderate protein-calorie malnutrition. 9.  History of transient ischemic attack. 10.  History of stroke.  FINAL DIAGNOSES: 1.  Status post atypical chest pain, myocardial infarction ruled out. 2.  Right forearm pain and swelling secondary to IV infiltration with evidence of mild cellulitis, improved. 3.  Status post hypotension secondary to medications. 4.  Tobacco use disorder. 5.  Hypertension. 6.  New onset diabetes mellitus. 7.  Recent descending aortic aneurysm with dissection and endograft stent placement.  CT of the chest was negative for endograft leak or dissection. 8.  Anemia of blood loss and chronic disease. 9.  Protein-calorie malnutrition. 10.  History of transient ischemic attack and history of stroke.  DISCHARGE HOME MEDICATIONS: 1.  Doxycycline 100 mg twice daily for 10 days. 2.  Metformin 500 mg half tablet daily. 3.  Albuterol inhaler 2 puffs every 6 hours as needed. 4.  Aspirin 81 mg 1 tablet daily. 5.  Atorvastatin 10 mg 1 tablet daily. 6.  Symbicort 2 puffs twice daily. 7.  Celexa 20 mg daily. 8.  Colace 100 mg daily. 9.  Robitussin-DM 15 mL 4 times daily as needed. 10.  Hydrocodone/acetaminophen 1 tablet every 4 hours as needed for moderate pain. 11.  Losartan 50 mg daily. 12.  Metoprolol 25 mg half tablet twice daily. 13.  Nicotine 21 mg per 24 hours, 1 patch daily. 14.  Nitrostat 0.4 mg sublingual p.r.n. 15.  Pantoprazole 40 mg daily. 16.  Potassium chloride 10 mEq one tablet daily. 17.  Gabapentin 600 mg twice  daily.  DIET:  Low salt, low cholesterol, 1800-calorie ADA diet.    DISCHARGE INSTRUCTIONS:   1.  Monitor blood pressure and blood sugar regularly and chart.   2.  Follow with Dr. Algie Coffer in 1 week. 3.  The patient advised to keep the right arm elevated; if she notices progressive worsening swelling or fever, should call 911 and go to the ER.  CONDITION AT DISCHARGE:  Stable.  PHYSICAL EXAMINATION: GENERAL:  She was alert, awake, oriented x3. VITAL SIGNS:  Blood pressure was 105/67, pulse 98.  She had low grade temperature of 100.3. HEENT:  Normocephalic, atraumatic.  Eyes are blue.  Conjunctivae are pink.  Corneas clear.  PERRLA. NECK:  No adenopathy, no carotid bruit, no JVD. LUNGS:  Clear to auscultation bilaterally. CARDIOVASCULAR:  S1, S2 was normal.  There was II/VI systolic murmur.  No click, rub or gallop. ABDOMEN:  Soft, mild epigastric tenderness.  Bowel sounds are normal.  No organomegaly. EXTREMITIES:  There is no clubbing, cyanosis or edema. NEUROLOGIC:  She was alert, awake, oriented x3.  Grossly intact.  LABORATORY DATA:  Sodium was 138, potassium 4.2, glucose was 164, BUN 10, creatinine 0.70.  Lactic acid venous was 1.1.  Hemoglobin was 8.3, hematocrit 27.6, white count of 15.7.  Her last Sodium 133, potassium 3.8, BUN 10, creatinine 0.85, glucose was  113.  Cholesterol was 102, HDL was low at 23, LDL 58, triglycerides 103.  Last hemoglobin was 9.7, hematocrit 32.7, white count of 13.4, which has been stable.  Hemoglobin A1c was 6.7.  EKG showed sinus tachycardia  with minimal nonspecific ST-T wave  changes.  BRIEF HISTORY AND HOSPITAL COURSE:  The patient is a 64 year old female who presented with chest pain, left arm pain and numbness and visual and speech disturbance lasting few hours as she recently had a descending aortic dissection and had 2 stents  placed.  She had past medical history significant for coronary artery disease, COPD, neck and back pain, stroke,  depression, anemia and protein-calorie malnutrition.  Currently, she is chest pain free.  High sensitivity troponin I is normal.  EKG showed  sinus tachycardia.  Chest x-ray Stable.  CT chest was severely degraded by motion artifact; otherwise, been stable and patent thoracic aortic endograft with no endograft leaks.  The patient was admitted to telemetry unit.  MI was ruled out by serial enzymes and EKG.  The patient did not have any further episodes of chest pain during the hospital stay.  The patient had infiltration of the IV in the right arm with significant  swelling and mild redness, which has improved with a cold pack and was started on p.o. doxycycline.  The patient's forearm swelling has markedly improved.  The patient has remained afebrile during the hospital stay.  The patient will be discharged home  on above medications and will be followed up by Dr. Doylene Canard in 1 week.  JN/NUANCE D:06/02/2019 T:06/03/2019 JOB:[id number]

## 2019-06-03 NOTE — Telephone Encounter (Signed)
I have made the 1st attempt to contact the patient or family member in charge, in order to follow up from recently being discharged from the hospital. I left a message on voicemail but I will make another attempt at a different time.  

## 2019-06-04 NOTE — Telephone Encounter (Signed)
I have made the 2nd attempt to contact the patient or family member in charge, in order to follow up from recently being discharged from the hospital. Cannot leave message on voicemail. Will try again later.

## 2019-06-05 NOTE — Telephone Encounter (Signed)
I have made the 3rd attempt to contact the patient or family member in charge, in order to follow up from recently being discharged from the hospital. Phone voicemail stated cannot leave message and start ringing. No Answer.

## 2019-06-10 ENCOUNTER — Other Ambulatory Visit: Payer: Medicare HMO

## 2019-06-10 ENCOUNTER — Inpatient Hospital Stay: Admit: 2019-06-10 | Payer: Medicare HMO

## 2019-06-12 ENCOUNTER — Encounter: Payer: Medicare HMO | Admitting: Vascular Surgery

## 2019-07-05 ENCOUNTER — Other Ambulatory Visit: Payer: Self-pay | Admitting: Neurology

## 2019-07-05 DIAGNOSIS — G609 Hereditary and idiopathic neuropathy, unspecified: Secondary | ICD-10-CM

## 2019-07-05 DIAGNOSIS — G2581 Restless legs syndrome: Secondary | ICD-10-CM

## 2019-07-11 ENCOUNTER — Other Ambulatory Visit: Payer: Self-pay

## 2019-07-11 ENCOUNTER — Emergency Department (HOSPITAL_COMMUNITY): Payer: Medicare HMO

## 2019-07-11 ENCOUNTER — Inpatient Hospital Stay (HOSPITAL_COMMUNITY)
Admission: EM | Admit: 2019-07-11 | Discharge: 2019-07-24 | DRG: 220 | Disposition: A | Discharge status: Home Health | Payer: Medicare HMO | Attending: Cardiothoracic Surgery | Admitting: Cardiothoracic Surgery

## 2019-07-11 ENCOUNTER — Encounter (HOSPITAL_COMMUNITY): Payer: Self-pay | Admitting: Emergency Medicine

## 2019-07-11 DIAGNOSIS — F411 Generalized anxiety disorder: Secondary | ICD-10-CM | POA: Diagnosis present

## 2019-07-11 DIAGNOSIS — B182 Chronic viral hepatitis C: Secondary | ICD-10-CM | POA: Diagnosis present

## 2019-07-11 DIAGNOSIS — Z419 Encounter for procedure for purposes other than remedying health state, unspecified: Secondary | ICD-10-CM

## 2019-07-11 DIAGNOSIS — Z4682 Encounter for fitting and adjustment of non-vascular catheter: Secondary | ICD-10-CM

## 2019-07-11 DIAGNOSIS — Z9689 Presence of other specified functional implants: Secondary | ICD-10-CM

## 2019-07-11 DIAGNOSIS — F329 Major depressive disorder, single episode, unspecified: Secondary | ICD-10-CM | POA: Diagnosis present

## 2019-07-11 DIAGNOSIS — G2581 Restless legs syndrome: Secondary | ICD-10-CM | POA: Diagnosis present

## 2019-07-11 DIAGNOSIS — K219 Gastro-esophageal reflux disease without esophagitis: Secondary | ICD-10-CM | POA: Diagnosis present

## 2019-07-11 DIAGNOSIS — E1165 Type 2 diabetes mellitus with hyperglycemia: Secondary | ICD-10-CM | POA: Diagnosis present

## 2019-07-11 DIAGNOSIS — F0280 Dementia in other diseases classified elsewhere without behavioral disturbance: Secondary | ICD-10-CM | POA: Diagnosis present

## 2019-07-11 DIAGNOSIS — Z7983 Long term (current) use of bisphosphonates: Secondary | ICD-10-CM | POA: Diagnosis not present

## 2019-07-11 DIAGNOSIS — Z7984 Long term (current) use of oral hypoglycemic drugs: Secondary | ICD-10-CM

## 2019-07-11 DIAGNOSIS — R079 Chest pain, unspecified: Secondary | ICD-10-CM | POA: Diagnosis present

## 2019-07-11 DIAGNOSIS — G309 Alzheimer's disease, unspecified: Secondary | ICD-10-CM | POA: Diagnosis present

## 2019-07-11 DIAGNOSIS — F17213 Nicotine dependence, cigarettes, with withdrawal: Secondary | ICD-10-CM | POA: Diagnosis not present

## 2019-07-11 DIAGNOSIS — Z823 Family history of stroke: Secondary | ICD-10-CM

## 2019-07-11 DIAGNOSIS — J449 Chronic obstructive pulmonary disease, unspecified: Secondary | ICD-10-CM | POA: Diagnosis present

## 2019-07-11 DIAGNOSIS — Z79899 Other long term (current) drug therapy: Secondary | ICD-10-CM

## 2019-07-11 DIAGNOSIS — Z954 Presence of other heart-valve replacement: Secondary | ICD-10-CM

## 2019-07-11 DIAGNOSIS — K760 Fatty (change of) liver, not elsewhere classified: Secondary | ICD-10-CM | POA: Diagnosis present

## 2019-07-11 DIAGNOSIS — K30 Functional dyspepsia: Secondary | ICD-10-CM | POA: Diagnosis not present

## 2019-07-11 DIAGNOSIS — E1142 Type 2 diabetes mellitus with diabetic polyneuropathy: Secondary | ICD-10-CM | POA: Diagnosis present

## 2019-07-11 DIAGNOSIS — Z7951 Long term (current) use of inhaled steroids: Secondary | ICD-10-CM

## 2019-07-11 DIAGNOSIS — I712 Thoracic aortic aneurysm, without rupture, unspecified: Secondary | ICD-10-CM | POA: Diagnosis present

## 2019-07-11 DIAGNOSIS — D62 Acute posthemorrhagic anemia: Secondary | ICD-10-CM | POA: Diagnosis not present

## 2019-07-11 DIAGNOSIS — Z8249 Family history of ischemic heart disease and other diseases of the circulatory system: Secondary | ICD-10-CM

## 2019-07-11 DIAGNOSIS — Z882 Allergy status to sulfonamides status: Secondary | ICD-10-CM

## 2019-07-11 DIAGNOSIS — Z9889 Other specified postprocedural states: Secondary | ICD-10-CM

## 2019-07-11 DIAGNOSIS — Z7982 Long term (current) use of aspirin: Secondary | ICD-10-CM

## 2019-07-11 DIAGNOSIS — E785 Hyperlipidemia, unspecified: Secondary | ICD-10-CM | POA: Diagnosis present

## 2019-07-11 DIAGNOSIS — J90 Pleural effusion, not elsewhere classified: Secondary | ICD-10-CM

## 2019-07-11 DIAGNOSIS — Z825 Family history of asthma and other chronic lower respiratory diseases: Secondary | ICD-10-CM

## 2019-07-11 DIAGNOSIS — Z888 Allergy status to other drugs, medicaments and biological substances status: Secondary | ICD-10-CM

## 2019-07-11 DIAGNOSIS — I161 Hypertensive emergency: Secondary | ICD-10-CM | POA: Insufficient documentation

## 2019-07-11 DIAGNOSIS — I719 Aortic aneurysm of unspecified site, without rupture: Secondary | ICD-10-CM

## 2019-07-11 DIAGNOSIS — I160 Hypertensive urgency: Secondary | ICD-10-CM | POA: Diagnosis not present

## 2019-07-11 DIAGNOSIS — I2510 Atherosclerotic heart disease of native coronary artery without angina pectoris: Secondary | ICD-10-CM | POA: Diagnosis present

## 2019-07-11 DIAGNOSIS — E440 Moderate protein-calorie malnutrition: Secondary | ICD-10-CM | POA: Diagnosis present

## 2019-07-11 DIAGNOSIS — Z681 Body mass index (BMI) 19 or less, adult: Secondary | ICD-10-CM

## 2019-07-11 DIAGNOSIS — Z20822 Contact with and (suspected) exposure to covid-19: Secondary | ICD-10-CM | POA: Diagnosis present

## 2019-07-11 DIAGNOSIS — Z803 Family history of malignant neoplasm of breast: Secondary | ICD-10-CM

## 2019-07-11 DIAGNOSIS — Z01810 Encounter for preprocedural cardiovascular examination: Secondary | ICD-10-CM | POA: Diagnosis not present

## 2019-07-11 DIAGNOSIS — Z8673 Personal history of transient ischemic attack (TIA), and cerebral infarction without residual deficits: Secondary | ICD-10-CM

## 2019-07-11 DIAGNOSIS — Z881 Allergy status to other antibiotic agents status: Secondary | ICD-10-CM

## 2019-07-11 DIAGNOSIS — Z833 Family history of diabetes mellitus: Secondary | ICD-10-CM

## 2019-07-11 DIAGNOSIS — Z880 Allergy status to penicillin: Secondary | ICD-10-CM

## 2019-07-11 HISTORY — DX: Atherosclerotic heart disease of native coronary artery without angina pectoris: I25.10

## 2019-07-11 LAB — BASIC METABOLIC PANEL
Anion gap: 12 (ref 5–15)
BUN: 11 mg/dL (ref 8–23)
CO2: 21 mmol/L — ABNORMAL LOW (ref 22–32)
Calcium: 8.5 mg/dL — ABNORMAL LOW (ref 8.9–10.3)
Chloride: 102 mmol/L (ref 98–111)
Creatinine, Ser: 0.54 mg/dL (ref 0.44–1.00)
GFR calc Af Amer: >60 mL/min (ref >60)
GFR calc non Af Amer: >60 mL/min (ref >60)
Glucose, Bld: 194 mg/dL — ABNORMAL HIGH (ref 70–99)
Potassium: 4.6 mmol/L (ref 3.5–5.1)
Sodium: 135 mmol/L (ref 135–145)

## 2019-07-11 LAB — CBG MONITORING, ED
Glucose-Capillary: 111 mg/dL — ABNORMAL HIGH (ref 70–99)
Glucose-Capillary: 116 mg/dL — ABNORMAL HIGH (ref 70–99)
Glucose-Capillary: 123 mg/dL — ABNORMAL HIGH (ref 70–99)
Glucose-Capillary: 124 mg/dL — ABNORMAL HIGH (ref 70–99)

## 2019-07-11 LAB — TROPONIN I (HIGH SENSITIVITY)
Troponin I (High Sensitivity): 4 ng/L (ref <18)
Troponin I (High Sensitivity): 5 ng/L (ref <18)

## 2019-07-11 LAB — CBC
HCT: 29.2 % — ABNORMAL LOW (ref 36.0–46.0)
Hemoglobin: 8.4 g/dL — ABNORMAL LOW (ref 12.0–15.0)
MCH: 24.3 pg — ABNORMAL LOW (ref 26.0–34.0)
MCHC: 28.8 g/dL — ABNORMAL LOW (ref 30.0–36.0)
MCV: 84.6 fL (ref 80.0–100.0)
Platelets: 476 10*3/uL — ABNORMAL HIGH (ref 150–400)
RBC: 3.45 MIL/uL — ABNORMAL LOW (ref 3.87–5.11)
RDW: 19.8 % — ABNORMAL HIGH (ref 11.5–15.5)
WBC: 12.1 10*3/uL — ABNORMAL HIGH (ref 4.0–10.5)
nRBC: 0 % (ref 0.0–0.2)

## 2019-07-11 LAB — RESPIRATORY PANEL BY RT PCR (FLU A&B, COVID)
Influenza A by PCR: NEGATIVE
Influenza B by PCR: NEGATIVE
SARS Coronavirus 2 by RT PCR: NEGATIVE

## 2019-07-11 LAB — PROTIME-INR
INR: 1.1 (ref 0.8–1.2)
Prothrombin Time: 14.1 seconds (ref 11.4–15.2)

## 2019-07-11 MED ORDER — POLYETHYLENE GLYCOL 3350 17 G PO PACK: 17.0000 g | PACK | Freq: Every day | ORAL | Status: DC | PRN

## 2019-07-11 MED ORDER — MORPHINE SULFATE (PF) 2 MG/ML IV SOLN
2.0000 mg | INTRAVENOUS | Status: DC | PRN
  Administered 2019-07-11 – 2019-07-12 (×2): 4 mg via INTRAVENOUS
  Filled 2019-07-11 (×2): qty 2

## 2019-07-11 MED ORDER — FENTANYL CITRATE (PF) 100 MCG/2ML IJ SOLN
100.0000 ug | Freq: Once | INTRAMUSCULAR | Status: AC
  Administered 2019-07-11: 12:00:00 100 ug via INTRAVENOUS
  Filled 2019-07-11: qty 2

## 2019-07-11 MED ORDER — ALBUTEROL SULFATE (2.5 MG/3ML) 0.083% IN NEBU: 3.0000 mL | INHALATION_SOLUTION | Freq: Four times a day (QID) | RESPIRATORY_TRACT | Status: DC | PRN

## 2019-07-11 MED ORDER — OXYCODONE-ACETAMINOPHEN 5-325 MG PO TABS
1.0000 | ORAL_TABLET | Freq: Once | ORAL | Status: AC
  Administered 2019-07-11: 1 via ORAL
  Filled 2019-07-11: qty 1

## 2019-07-11 MED ORDER — INSULIN ASPART 100 UNIT/ML ~~LOC~~ SOLN
1.0000 [IU] | SUBCUTANEOUS | Status: DC
  Administered 2019-07-13: 1 [IU] via SUBCUTANEOUS
  Administered 2019-07-13: 2 [IU] via SUBCUTANEOUS
  Administered 2019-07-13 – 2019-07-14 (×3): 1 [IU] via SUBCUTANEOUS

## 2019-07-11 MED ORDER — ONDANSETRON HCL 4 MG/2ML IJ SOLN
4.0000 mg | Freq: Once | INTRAMUSCULAR | Status: AC
  Administered 2019-07-11: 4 mg via INTRAVENOUS
  Filled 2019-07-11: qty 2

## 2019-07-11 MED ORDER — FENTANYL CITRATE (PF) 100 MCG/2ML IJ SOLN
100.0000 ug | Freq: Once | INTRAMUSCULAR | Status: AC
  Administered 2019-07-11: 100 ug via INTRAVENOUS
  Filled 2019-07-11: qty 2

## 2019-07-11 MED ORDER — METRONIDAZOLE IN NACL 5-0.79 MG/ML-% IV SOLN
500.0000 mg | Freq: Three times a day (TID) | INTRAVENOUS | Status: DC
  Administered 2019-07-11 – 2019-07-12 (×3): 500 mg via INTRAVENOUS
  Filled 2019-07-11 (×3): qty 100

## 2019-07-11 MED ORDER — VANCOMYCIN HCL 1250 MG/250ML IV SOLN
1250.0000 mg | INTRAVENOUS | Status: AC
  Administered 2019-07-11 – 2019-07-14 (×4): 1250 mg via INTRAVENOUS
  Filled 2019-07-11 (×5): qty 250

## 2019-07-11 MED ORDER — CITALOPRAM HYDROBROMIDE 20 MG PO TABS
20.0000 mg | ORAL_TABLET | Freq: Every day | ORAL | Status: DC
  Administered 2019-07-11 – 2019-07-24 (×13): 20 mg via ORAL
  Filled 2019-07-11 (×4): qty 1
  Filled 2019-07-11: qty 2
  Filled 2019-07-11 (×8): qty 1

## 2019-07-11 MED ORDER — ESMOLOL HCL-SODIUM CHLORIDE 2000 MG/100ML IV SOLN
25.0000 ug/kg/min | INTRAVENOUS | Status: DC
  Administered 2019-07-11: 150 ug/kg/min via INTRAVENOUS
  Administered 2019-07-11: 25 ug/kg/min via INTRAVENOUS
  Administered 2019-07-11: 175 ug/kg/min via INTRAVENOUS
  Administered 2019-07-12: 100 ug/kg/min via INTRAVENOUS
  Administered 2019-07-12: 150 ug/kg/min via INTRAVENOUS
  Administered 2019-07-13: 25 ug/kg/min via INTRAVENOUS
  Filled 2019-07-11 (×8): qty 100

## 2019-07-11 MED ORDER — SODIUM CHLORIDE 0.9% FLUSH
3.0000 mL | Freq: Once | INTRAVENOUS | Status: AC
  Administered 2019-07-11: 3 mL via INTRAVENOUS

## 2019-07-11 MED ORDER — ATORVASTATIN CALCIUM 10 MG PO TABS
10.0000 mg | ORAL_TABLET | Freq: Every day | ORAL | Status: DC
  Administered 2019-07-11 – 2019-07-23 (×12): 10 mg via ORAL
  Filled 2019-07-11 (×12): qty 1

## 2019-07-11 MED ORDER — MORPHINE SULFATE (PF) 4 MG/ML IV SOLN
4.0000 mg | Freq: Once | INTRAVENOUS | Status: AC
  Administered 2019-07-11: 4 mg via INTRAVENOUS
  Filled 2019-07-11: qty 1

## 2019-07-11 MED ORDER — MORPHINE SULFATE (PF) 4 MG/ML IV SOLN: 4.0000 mg | INTRAVENOUS | Status: DC | PRN

## 2019-07-11 MED ORDER — DOCUSATE SODIUM 100 MG PO CAPS
100.0000 mg | ORAL_CAPSULE | Freq: Every day | ORAL | Status: DC
  Administered 2019-07-13 – 2019-07-14 (×2): 100 mg via ORAL
  Filled 2019-07-11 (×4): qty 1

## 2019-07-11 MED ORDER — PANTOPRAZOLE SODIUM 40 MG IV SOLR
40.0000 mg | Freq: Every day | INTRAVENOUS | Status: DC
  Administered 2019-07-11: 40 mg via INTRAVENOUS
  Filled 2019-07-11: qty 40

## 2019-07-11 MED ORDER — IPRATROPIUM-ALBUTEROL 0.5-2.5 (3) MG/3ML IN SOLN
3.0000 mL | Freq: Two times a day (BID) | RESPIRATORY_TRACT | Status: DC
  Administered 2019-07-11 – 2019-07-12 (×3): 3 mL via RESPIRATORY_TRACT
  Filled 2019-07-11 (×3): qty 3

## 2019-07-11 MED ORDER — IOHEXOL 350 MG/ML SOLN
80.0000 mL | Freq: Once | INTRAVENOUS | Status: AC | PRN
  Administered 2019-07-11: 80 mL via INTRAVENOUS

## 2019-07-11 MED ORDER — DOCUSATE SODIUM 100 MG PO CAPS
100.0000 mg | ORAL_CAPSULE | Freq: Two times a day (BID) | ORAL | Status: DC | PRN
  Administered 2019-07-14: 100 mg via ORAL

## 2019-07-11 MED ORDER — NICOTINE 14 MG/24HR TD PT24
14.0000 mg | MEDICATED_PATCH | Freq: Once | TRANSDERMAL | Status: AC
  Administered 2019-07-11: 14 mg via TRANSDERMAL
  Filled 2019-07-11: qty 1

## 2019-07-11 MED ORDER — SODIUM CHLORIDE 0.9 % IV SOLN
2.0000 g | Freq: Three times a day (TID) | INTRAVENOUS | Status: DC
  Administered 2019-07-11 – 2019-07-17 (×15): 2 g via INTRAVENOUS
  Filled 2019-07-11 (×20): qty 2

## 2019-07-11 NOTE — ED Notes (Signed)
Ventura Sellers sister [phone] looking for an update on pt

## 2019-07-11 NOTE — ED Notes (Signed)
Pt transported to CT ?

## 2019-07-11 NOTE — ED Provider Notes (Signed)
Care assumed from previous team including Dr. Eudelia Bunch and Ivar Drape, PA-C.  At time of transfer of care, patient is awaiting disposition recommendations from the consulting services of vascular surgery and CT surgery.  10:31 AM Nursing team spoke to Dr. Arbie Cookey who reports that they will see the patient in consultation but they feel CT surgery needs to be involved and they will not admit.  Also Joe spoke to CT surgery team member who reports that they also will consult but feel she needs admitted to critical care and they will see to make recommendations.  Critical care team will be called for further management and admission in regards to the either contained rupture versus new pseudoaneurysms on the aorta causing chest pain.  10:36 AM Just spoke with critical care who will come see the patient to admit.  As I was discussing, pressures increased up to the 160s systolic and patient is nearly tachycardic with rates in the 90s.  Critical care recommended starting esmolol drip and they will touch base with the consultants to determine the most appropriate blood pressure and heart rate goals.  They will come to admit.  11:16 AM Joe spoke again with vascular surgery who recommended a goal of less than 80 for heart rate in less than 120 for blood pressure.  They agree with esmolol.  They also feel that after blood cultures were obtained, they would recommend starting broad-spectrum antibiotics with Vanco and Zosyn.  I verbally communicated with the admitting critical care team who was at the bedside these recommendations.  I will modify my blood pressure management goals in the order.  Patient will be admitted to critical care with vascular and CT consulting.  Clinical Impression: 1. Chest pain, unspecified type   2. Pseudoaneurysm of aorta (HCC)     Disposition: Admit  This note was prepared with assistance of Dragon voice recognition software. Occasional wrong-word or sound-a-like substitutions  may have occurred due to the inherent limitations of voice recognition software.    CRITICAL CARE Performed by: Canary Brim Isiaah Cuervo Total critical care time: 35 minutes Critical care time was exclusive of separately billable procedures and treating other patients. Critical care was necessary to treat or prevent imminent or life-threatening deterioration. Critical care was time spent personally by me on the following activities: development of treatment plan with patient and/or surrogate as well as nursing, discussions with consultants, evaluation of patient's response to treatment, examination of patient, obtaining history from patient or surrogate, ordering and performing treatments and interventions, ordering and review of laboratory studies, ordering and review of radiographic studies, pulse oximetry and re-evaluation of patient's condition.    Katasha Riga, Canary Brim, MD 07/11/19 [phone]

## 2019-07-11 NOTE — ED Notes (Signed)
Pt reports that her pain was unchanged with her pain medication. Rob PA notified. New order to be placed.

## 2019-07-11 NOTE — ED Provider Notes (Signed)
[facility]   CSN: [id number] Arrival date & time: 07/11/19  0149     History Chief Complaint  Patient presents with  . Chest Pain    Brittany [name] is a 64 y.o. female.  Patient presents to the emergency department with a chief complaint of chest pain.  She reports having had chest pain for the past 3 days, but states that the symptoms significantly worsened today.  She has history of recently repaired thoracic aortic aneurysm rupture.  She states that she has been feeling well up until 3 days ago.  She denies any shortness of breath, fever, chills, cough.  Denies any back pain.  She states that she tried taking aspirin and nitroglycerin with no relief.  She denies any other associated symptoms.  The history is provided by the patient. No language interpreter was used.       Past Medical History:  Diagnosis Date  . Alzheimer disease (HCC)   . Anxiety   . Bilateral pneumonia 01/2017   per new patient packet   . COPD (chronic obstructive pulmonary disease) (HCC) 01/2017   per new patient packet   . Coronary artery disease   . Depression   . Fatty liver   . Gallbladder disease 2014   per new patient packet   . Hernia, abdominal   . Hypertension   . Liver damage 01/2017   per new patient packet, Dr.Kadakia  . Normal cardiac stress test 2007  . Ovarian cyst 1977   8 1/2 lb left ovarian cyst, Dr.Cox, per new patient packet   . Pneumonia   . Right ovarian cyst    1992, 1993, 1994, 1995, and 1996 Dr.Neil, per new patient packet   . Stroke (HCC)    4 strokes.     Patient Active Problem List   Diagnosis Date Noted  . Acute coronary syndrome (HCC) 05/28/2019  . Thoracic aortic aneurysm, ruptured (HCC) 05/11/2019  . Cerebrovascular accident (CVA) (HCC) 02/19/2019  . Lacunar infarct, acute (HCC) 01/05/2019  . Altered mental status associated with intoxication (HCC) 01/05/2019  . Hypotension 01/05/2019  . Left wrist  fracture 01/05/2019  . AKI (acute kidney injury) (HCC) 01/05/2019  . Restless leg syndrome 07/10/2018  . Peripheral neuropathy 07/10/2018  . COPD (chronic obstructive pulmonary disease) (HCC) 10/05/2017  . GERD (gastroesophageal reflux disease) 10/05/2017  . Depression 10/05/2017  . Facial cellulitis-left 10/05/2017  . Diarrhea 10/05/2017  . Chronic viral hepatitis C (HCC) 06/20/2017  . Idiopathic peripheral neuropathy 05/19/2017  . Chronic low back pain without sciatica 05/19/2017  . Hyperglycemia 05/19/2017  . Chronic bronchitis (HCC) 03/08/2017  . Elevated LFTs 03/08/2017  . 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 GAD (generalized anxiety disorder) 03/08/2017  . Hepatomegaly 03/08/2017  . Acute exacerbation of chronic obstructive pulmonary disease (COPD) (HCC) 02/16/2017  . Acute respiratory failure with hypoxia (HCC)   . COPD with acute exacerbation (HCC) 01/27/2017  . CAP (community acquired pneumonia) 01/27/2017  . Tachycardia 01/27/2017  . COPD exacerbation (HCC) 01/27/2017  . Hypokalemia 01/27/2017  . Left arm numbness 11/28/2016    Class: Chronic  . Nondisplaced transverse fracture of left patella, initial encounter for closed fracture 06/10/2016  . Malnutrition of moderate degree (HCC) 07/12/2013  . Syncope 07/11/2013  . GASTROENTERITIS 05/07/2010  . TOBACCO ABUSE 07/01/2009  . Benign essential HTN 06/24/2009  . BRONCHITIS, ACUTE 06/24/2009    Past Surgical History:  Procedure Laterality Date  .  ABDOMINAL HYSTERECTOMY Bilateral    Total  . CESAREAN SECTION    . CHOLECYSTECTOMY    . INGUINAL HERNIA REPAIR Right   . LEFT OOPHORECTOMY  1977   with cyst removal, age 48  . MINOR EXCISION OF ORAL LESION N/A 10/07/2017   Procedure: IRRIGATION AND DEBRIDEMENT OF ORAL INFECTION, REMOVAL OF REMAINING 8 TEETH AND PLACEMENT OF PENROSE DRAINS;  Surgeon: Vivia Ewing, DMD;  Location: [facility] ORS;  Service: Oral Surgery;   Laterality: N/A;  . OVARIAN CYST REMOVAL Right 93, 94,95, 96  . THORACIC AORTIC ENDOVASCULAR STENT GRAFT N/A 05/11/2019   Procedure: THORACIC AORTIC ENDOVASCULAR STENT GRAFT, RETROPERITONEAL CONDUIT RIGHT ILIAC ARTERY;  Surgeon: Chuck Hint, MD;  Location: [facility];  Service: Vascular;  Laterality: N/A;  . UMBILICAL HERNIA REPAIR       OB History   No obstetric history on file.     Family History  Problem Relation Age of Onset  . Stroke Mother   . COPD Mother   . Dementia Mother   . Breast cancer Mother   . Diabetes Father   . Heart attack Father   . Diabetes Sister   . Stroke Sister        x2  . Hepatitis C Daughter   . Diabetes Paternal Grandmother     Social History   Tobacco Use  . Smoking status: Current Every Day Smoker    Packs/day: 1.00    Years: 46.00    Pack years: 46.00    Types: Cigarettes  . Smokeless tobacco: Never Used  Substance Use Topics  . Alcohol use: Yes    Alcohol/week: 1.0 standard drinks    Types: 1 Cans of beer per week    Comment: once or twice a year.   . 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 Authorizing Provider  albuterol (PROVENTIL HFA;VENTOLIN HFA) 108 (90 Base) MCG/ACT inhaler Inhale 2 puffs into the lungs every 6 (six) hours as needed for wheezing or shortness of breath.   Yes [provider]  alendronate (FOSAMAX) 70 MG tablet Take with a full glass of water on an empty stomach. Patient taking differently: Take 70 mg by mouth every Wednesday.  05/06/19  Yes Ngetich, Dinah C, NP  aspirin EC 81 MG EC tablet Take 1 tablet (81 mg total) by mouth daily. 01/07/19  Yes Jae Dire, MD  atorvastatin (LIPITOR) 10 MG tablet Take 1 tablet (10 mg total) by mouth daily at 6 PM. 05/15/19  Yes Orpah Cobb, MD  citalopram (CELEXA) 40 MG tablet Take 0.5 tablets (20 mg total) by mouth daily for 30 days.  05/04/18 05/28/28 Yes Ngetich, Dinah C, NP  losartan (COZAAR) 50 MG tablet Take 1 tablet (50 mg total) daily by mouth. 02/02/17  Yes Vassie Loll, MD  metFORMIN (GLUCOPHAGE) 500 MG tablet Take 0.5 tablets (250 mg total) by mouth daily with breakfast. 06/02/19  Yes Rinaldo Cloud, MD  nitroGLYCERIN (NITROSTAT) 0.4 MG SL tablet Place 1 tablet (0.4 mg total) under the tongue every 5 (five) minutes x 3 doses as needed for chest pain (chest pain). 02/18/17  Yes Orpah Cobb, MD  pantoprazole (PROTONIX) 40 MG tablet Take 1 tablet (40 mg total) by mouth daily. 05/16/19  Yes Orpah Cobb, MD  potassium chloride SA (KLOR-CON) 10 MEQ tablet Take 1 tablet (10 mEq total) by mouth daily. 05/15/19  Yes Orpah Cobb, MD  budesonide-formoterol (SYMBICORT) 160-4.5 MCG/ACT inhaler Inhale 2 puffs into the lungs daily as needed (for flares).     [provider]  docusate sodium (COLACE) 100 MG capsule Take 1 capsule (100 mg total) by mouth daily. Patient not taking: Reported on 07/11/2019 05/15/19   Orpah Cobb, MD  doxycycline (VIBRA-TABS) 100 MG tablet Take 1 tablet (100 mg total) by mouth 2 (two) times daily. Patient not taking: Reported on 07/11/2019 06/02/19   Rinaldo Cloud, MD  gabapentin (NEURONTIN) 300 MG capsule Take 2 capsules (600 mg total) by mouth 2 (two) times daily. Take 600 mg by mouth in the morning and 600 mg at bedtime Patient taking differently: Take 900 mg by mouth in the morning and at bedtime.  02/19/19   Levert Feinstein, MD  guaiFENesin-dextromethorphan (ROBITUSSIN DM) 100-10 MG/5ML syrup Take 15 mLs by mouth every 4 (four) hours as needed for cough. Patient not taking: Reported on 07/11/2019 05/15/19   Orpah Cobb, MD  HYDROcodone-acetaminophen (NORCO/VICODIN) 5-325 MG tablet Take 1 tablet by mouth every 4 (four) hours as needed for moderate pain. Patient not taking: Reported on 07/11/2019 05/15/19 05/14/20  Milinda Antis, PA-C  metoprolol tartrate (LOPRESSOR) 25 MG tablet Take 0.5 tablets  (12.5 mg total) by mouth 2 (two) times daily. 06/02/19   Rinaldo Cloud, MD  nicotine (NICODERM CQ - DOSED IN MG/24 HOURS) 21 mg/24hr patch Place 1 patch (21 mg total) onto the skin daily. Patient not taking: Reported on 07/11/2019 05/16/19   Orpah Cobb, MD    Allergies    Amoxicillin, Clindamycin/lincomycin, Ropinirole, Wellbutrin [bupropion], Sulfamethoxazole, and Sulfonamide derivatives  Review of Systems   Review of Systems  All other systems reviewed and are negative.   Physical Exam Updated Vital Signs BP (!) 146/103   Pulse 99   Temp (!) 97.5 F (36.4 C) (Oral)   Resp (!) 21   Ht 5\' 2"  (1.575 m)   Wt 55 kg   SpO2 100%   BMI 22.18 kg/m   Physical Exam Vitals and nursing note reviewed.  Constitutional:      General: She is not in acute distress.    Appearance: She is well-developed.  HENT:     Head: Normocephalic and atraumatic.  Eyes:     Conjunctiva/sclera: Conjunctivae normal.  Cardiovascular:     Rate and Rhythm: Normal rate and regular rhythm.     Heart sounds: No murmur.  Pulmonary:     Effort: Pulmonary effort is normal. No respiratory distress.     Breath sounds: Normal breath sounds.  Abdominal:     Palpations: Abdomen is soft.     Tenderness: There is no abdominal tenderness.  Musculoskeletal:        General: Normal range of motion.     Cervical back: Neck supple.  Skin:    General: Skin is warm and dry.  Neurological:     Mental Status: She is alert and oriented to person, place, and time.  Psychiatric:        Mood and Affect: Mood normal.        Behavior: Behavior normal.     ED Results / Procedures / Treatments   Labs (all labs ordered are listed, but only abnormal results are displayed) Labs Reviewed  BASIC METABOLIC PANEL - Abnormal; Notable for the following components:      Result Value   CO2 21 (*)    Glucose, Bld 194 (*)    Calcium 8.5 (*)    All  other components within normal limits  CBC - Abnormal; Notable for the following  components:   WBC 12.1 (*)    RBC 3.45 (*)    Hemoglobin 8.4 (*)    HCT 29.2 (*)    MCH 24.3 (*)    MCHC 28.8 (*)    RDW 19.8 (*)    Platelets 476 (*)    All other components within normal limits  PROTIME-INR  TROPONIN I (HIGH SENSITIVITY)  TROPONIN I (HIGH SENSITIVITY)    EKG EKG Interpretation  Date/Time:  Thursday July 11 2019 03:43:01 EDT Ventricular Rate:  106 PR Interval:    QRS Duration: 73 QT Interval:  365 QTC Calculation: 485 R Axis:   64 Text Interpretation: Sinus tachycardia Anteroseptal infarct, age indeterminate No significant change since last tracing Confirmed by Drema Pry [phone]) on 07/11/2019 5:36:25 AM   Radiology DG Chest 2 View  Result Date: 07/11/2019 CLINICAL DATA:  Chest pain EXAM: CHEST - 2 VIEW COMPARISON:  05/28/2019 FINDINGS: Heart is normal size. Aortic stent remains in place, unchanged. Lungs clear. No effusions or acute bony abnormality. IMPRESSION: Prior stent graft of the descending thoracic aorta. No acute cardiopulmonary disease. Electronically Signed   By: Charlett Nose M.D.   On: 07/11/2019 02:56   CT Angio Chest/Abd/Pel for Dissection W and/or Wo Contrast  Result Date: 07/11/2019 CLINICAL DATA:  Shortness of breath for 3 days. Follow-up thoracic aortic aneurysm. History of stent graft. EXAM: CT ANGIOGRAPHY CHEST, ABDOMEN AND PELVIS TECHNIQUE: Non-contrast CT of the chest was initially obtained. Multidetector CT imaging through the chest, abdomen and pelvis was performed using the standard protocol during bolus administration of intravenous contrast. Multiplanar reconstructed images and MIPs were obtained and reviewed to evaluate the vascular anatomy. CONTRAST:  18mL OMNIPAQUE IOHEXOL 350 MG/ML SOLN COMPARISON:  05/28/2019 FINDINGS: CTA CHEST FINDINGS Cardiovascular: The heart is normal in size. No pericardial effusion. The ascending thoracic aorta is normal in caliber. No dissection. There is a new pseudoaneurysm or contained rupture at the  aortic arch just proximal to the stent graft. This measures a maximum of 2.7 cm and is projecting off the inferior wall of the aortic arch. The stent graft is intact. The chronic surrounding hematoma is unchanged in size. No evidence of graft leakage. Just below the stent graft in the descending thoracic aorta is a second area of contained rupture or acute pseudoaneurysm formation which appear to be and 2 separate but adjacent area is along the left side of the aorta. The directly lateral outpouching measures a maximum of 17 mm and the more posterior outpouching measures 16 mm. No dissection. Mediastinum/Nodes: No mediastinal or hilar mass or adenopathy. The esophagus is grossly normal. Lungs/Pleura: Stable emphysematous changes but no infiltrates, edema or effusions. A few tiny scattered pulmonary nodules are stable. Minimal dependent subpleural atelectasis. Musculoskeletal: No breast masses, chest wall mass, supraclavicular or axillary adenopathy. The thyroid gland is unremarkable. The bony thorax is intact. Review of the MIP images confirms the above findings. CTA ABDOMEN AND PELVIS FINDINGS VASCULAR Aorta: Stable atherosclerotic changes. Stable small pseudoaneurysms or small ulcerated plaques distally. No dissection. Celiac: Mild ostial calcifications. No stenosis. SMA: Normal Renals: Mild left ostial atherosclerotic change but no stenosis. IMA: Patent Inflow: Advanced atherosclerotic calcifications but no significant stenosis. Veins: Grossly normal. Review of the MIP images confirms the above findings. NON-VASCULAR Hepatobiliary: No focal hepatic lesions or intrahepatic biliary dilatation. The gallbladder is surgically absent. Mild associated common bile duct dilatation, stable. Pancreas: Moderate pancreatic atrophy but no mass  or acute inflammation. Spleen: Normal size. No focal lesions. Adrenals/Urinary Tract: The adrenal glands and kidneys are unremarkable. No renal lesions or hydronephrosis. The bladder  appears normal. Stomach/Bowel: The stomach, duodenum, small bowel and colon are grossly normal without oral contrast. No inflammatory changes, mass lesions or obstructive findings. Lymphatic: No mesenteric or retroperitoneal mass or adenopathy. Reproductive: Surgically absent. Other: Small anterior abdominal wall hernia containing fat. Musculoskeletal: No significant bony findings. Review of the MIP images confirms the above findings. IMPRESSION: 1. New pseudoaneurysms or contained ruptures just above and just below the thoracic aortic stent graft. No mediastinal hematoma. 2. Stable small pseudoaneurysms or small ulcerated plaques in the distal abdominal aorta. No dissection. Abdominal aortic branch vessels are patent. 3. Stable emphysematous changes and pulmonary scarring. 4. No acute pulmonary findings. 5. No acute abdominal/pelvic findings, mass lesions or lymphadenopathy. 6. Emphysema and aortic atherosclerosis. These results were called by telephone at the time of interpretation on 07/11/2019 at 5:42 am to provider Roxy Horseman , who verbally acknowledged these results. Aortic Atherosclerosis (ICD10-I70.0) and Emphysema (ICD10-J43.9). Electronically Signed   By: Rudie Meyer M.D.   On: 07/11/2019 05:45    Procedures .Critical Care Performed by: Roxy Horseman, PA-C Authorized by: Roxy Horseman, PA-C   Critical care provider statement:    Critical care time (minutes):  40   Critical care was necessary to treat or prevent imminent or life-threatening deterioration of the following conditions:  Circulatory failure   Critical care was time spent personally by me on the following activities:  Discussions with consultants, evaluation of patient's response to treatment, examination of patient, ordering and performing treatments and interventions, ordering and review of laboratory studies, ordering and review of radiographic studies, pulse oximetry, re-evaluation of patient's condition, obtaining  history from patient or surrogate and review of old charts   (including critical care time)  Medications Ordered in ED Medications  sodium chloride flush (NS) 0.9 % injection 3 mL (has no administration in time range)  oxyCODONE-acetaminophen (PERCOCET/ROXICET) 5-325 MG per tablet 1 tablet (1 tablet Oral Given 07/11/19 0201)  morphine 4 MG/ML injection 4 mg (4 mg Intravenous Given 07/11/19 0453)  ondansetron (ZOFRAN) injection 4 mg (4 mg Intravenous Given 07/11/19 0452)    ED Course  I have reviewed the triage vital signs and the nursing notes.  Pertinent labs & imaging results that were available during my care of the patient were reviewed by me and considered in my medical decision making (see chart for details).    MDM Rules/Calculators/A&P                      This patient complains of chest pain x3 days, this involves an extensive number of treatment options, and is a complaint that carries with it a high risk of complications and morbidity.  The differential diagnosis includes ACS, PE, infectious process, complication from recent aortic aneurysm repair.  I ordered, reviewed, and interpreted labs, which included CBC, notable for hemoglobin of 8.4, which is decreased from most recent of 9.6.  Mild nonspecific leukocytosis to 12.1.  Troponin is 4.  No significant electrolyte derangement. I ordered medication morphine and Zofran for chest pain.  She has tried sublingual nitroglycerin along with aspirin already without any relief. I ordered imaging studies which included CT dissection study and chest x-ray and I independently visualized and interpreted imaging which showed previous stent on chest x-ray, no other acute abnormality.  CT dissection study shows concern for contained rupture or pseudoaneursym. Previous  records obtained and reviewed recent admission for thoracic aortic aneurysm repair.  5:38 AM I received a telephone call from radiology, who reports critical findings of  pseudoaneurysm developing on either end of the recently placed graft.  Recommending emergent consultation with vascular surgery. 5:39 AM I personally consulted and spoke with Dr. Donnetta Hutching, with vascular surgery, who will evaluate the patient.   Anticipate admission.  Oncoming team notified of plan.  Final Clinical Impression(s) / ED Diagnoses Final diagnoses:  Chest pain, unspecified type  Pseudoaneurysm of aorta [facility])    Rx / DC Orders ED Discharge Orders    None       Montine Circle, PA-C 07/11/19 0272    Fatima Blank, MD 07/11/19 2238

## 2019-07-11 NOTE — ED Notes (Signed)
Paged vascular team for update after no further orders on pt. Vascular MD Ready reports pt is to be admitted by ER doctor and that CT surgery is reviewing pt's results. ER MD Tegeler notified that no admit order placed by vascular.

## 2019-07-11 NOTE — H&P (Signed)
NAME:  Brittany [name], MRN:  [id number], DOB:  05-28-1955, LOS: 0 ADMISSION DATE:  07/11/2019, CONSULTATION DATE:  07/11/2019 REFERRING MD:  Theda Belfast, CHIEF COMPLAINT:  Chest pain.   Brief History   Brittany [name] is a 64 yo F with Hx of Thoracic Aortic Aneurysm (s/p repair/stenting Feb 2021), HTN, Substance Use, CVA, GERD, COPD, Anxiety/Depresion, Hep C, and Neuropathy who presents with 3 days of worsening chest pain. Reports cocaine use once last week.  CTA in ED showed pseudoaneurysms at distal and proximal ends of graft. Vascular surgery was consulted who will discuss case with CT surgery. 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 PCCM asked to admit to ICU on infusion for BP control.  History of present illness   Brittany [name] is a 64 yo F with Hx of Thoracic Aortic Aneurysm (s/p repair/stenting Feb 2021), HTN, Substance Use, CVA, GERD, COPD, Anxiety/Depresion, Hep C, and Neuropathy who presents with 3 days of worsening chest pain. Pain began 3 days ago when pushing her garbage can to the curb. It has been constant since that time and became severe 910/10) one day ago. The pain is in her central chest and, at times, radiates to her back. She reports 5 days of dark stools, nausea and subjective fever about 2 weeks ago. Denies current fevers, chills, SOB, N/V, Diarrhea, H/A, vision changes.  In ED CTA was performed, found to have pseudoaneurysms at distal and proximal ends of graft. Vascular surgery was consulted who will discuss case with CT surgery. PCCM asked to admit to ICU on infusion for BP control.  Past Medical History  Thoracic Aortic Aneurysm (s/p repair/stenting Feb 2021) HTN Substance Use CVA GERD COPD Anxiety/Depresion Hep C (cleared per chart) Neuropathy  Significant Hospital Events   4/22: Admit  Consults:  VVS  Procedures:  None  Significant Diagnostic Tests:  4/22 CTA C/A/P: IMPRESSION: 1. New pseudoaneurysms or contained  ruptures just above and just below the thoracic aortic stent graft. No mediastinal hematoma. 2. Stable small pseudoaneurysms or small ulcerated plaques in the distal abdominal aorta. No dissection. Abdominal aortic branch vessels are patent. 3. Stable emphysematous changes and pulmonary scarring. 4. No acute pulmonary findings. 5. No acute abdominal/pelvic findings, mass lesions or lymphadenopathy. 6. Emphysema and aortic atherosclerosis.  Micro Data:  4/22 Blood Cultures: Pending  Antimicrobials:  Vancomycin: 4/22 > Zosyn: 4/22 >  Interim history/subjective:  In pain in ED, BP gtt not yet started.  Objective   Blood pressure (!) 160/76, pulse (!) 101, temperature (!) 97.5 F (36.4 C), temperature source Oral, resp. rate 19, height 5\' 2"  (1.575 m), weight 55 kg, SpO2 94 %.       No intake or output data in the 24 hours ending 07/11/19 1125 Filed Weights   07/11/19 0152  Weight: 55 kg   Examination: General: Thin, Female, In significant pain HENT: Moist oral mucosa, [place]/AT Lungs: CTAB Cardiovascular: RRR, Murmur, Distal pulses intact bilaterally Abdomen: Soft, non-tender, small periumbilical hernia, faint flow murmur Extremities: Warm Neuro: Alert and oriented, no focal deficit  Resolved Hospital Problem list   None  Assessment & Plan:   HTN Thoracic Aortic Aneurysm (s/p repair and stenting in Feb 2021) P/W chest pain. CTA showed pseudoaneurysms and proximal and distal ends of graft. BP elevated in ED to 160s. Reports monthly cocaine use (when her daughter visits), last use last week. 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 - VVS following and discussing case with  TCTS - Esmolol gtt, Goal BP <120, Goal HR <80 - Morphine 2-4mg  q4h - Vanc and Zosyn per pharmacy - NPO  Substance Use: Reports intermittent Marijuana and Cocaine use (last use of cocaine was last week), was unsure about amphetamines. Prior UDS (01/05/2019) + for  Opiates (prescribed), Cocaine, Amphetamines, THC.  - Counseled cessation as cocaine and amphetamines could cause death in the setting of her Aneurysm. - UDS  COPD: Lungs Clear - Dulera inhaler BUD - Albuterol PRN  Anxiety and Depression: Holding Celexa while NPO Hx CVA: Holding ASA, Holding Statin while NPO Neuropathy: Holding Gabapentin while NPO GERD: PPI  Best practice:  Diet: NPO Pain/Anxiety/Delirium protocol (if indicated): Morphine VAP protocol (if indicated): N/A DVT prophylaxis: SCDs GI prophylaxis: PPI Glucose control: SSI Mobility: BR Code Status: Full Family Communication: Discussed with patient Disposition: ICU  Labs   CBC: Recent Labs  Lab 07/11/19 0234  WBC 12.1*  HGB 8.4*  HCT 29.2*  MCV 84.6  PLT 476*    Basic Metabolic Panel: Recent Labs  Lab 07/11/19 0234  NA 135  K 4.6  CL 102  CO2 21*  GLUCOSE 194*  BUN 11  CREATININE 0.54  CALCIUM 8.5*   GFR: Estimated Creatinine Clearance: 58.4 mL/min (by C-G formula based on SCr of 0.54 mg/dL). Recent Labs  Lab 07/11/19 0234  WBC 12.1*    Liver Function Tests: No results for input(s): AST, ALT, ALKPHOS, BILITOT, PROT, ALBUMIN in the last 168 hours. No results for input(s): LIPASE, AMYLASE in the last 168 hours. No results for input(s): AMMONIA in the last 168 hours.  ABG    Component Value Date/Time   HCO3 22.6 01/05/2019 1509   TCO2 27 05/28/2019 2202   ACIDBASEDEF 6.0 (H) 01/05/2019 1509   O2SAT 87.0 01/05/2019 1509     Coagulation Profile: Recent Labs  Lab 07/11/19 0234  INR 1.1    Cardiac Enzymes: No results for input(s): CKTOTAL, CKMB, CKMBINDEX, TROPONINI in the last 168 hours.  HbA1C: Hgb A1c MFr Bld  Date/Time Value Ref Range Status  05/29/2019 04:40 AM 6.7 (H) 4.8 - 5.6 % Final    Comment:    (NOTE) Pre diabetes:          5.7%-6.4% Diabetes:              >6.4% Glycemic control for   <7.0% adults with diabetes   01/06/2019 06:28 AM 5.8 (H) 4.8 - 5.6 % Final     Comment:    (NOTE)         Prediabetes: 5.7 - 6.4         Diabetes: >6.4         Glycemic control for adults with diabetes: <7.0     CBG: No results for input(s): GLUCAP in the last 168 hours.  Review of Systems:   Performed and negative except as listed in HPI.  Past Medical History  She,  has a past medical history of Alzheimer disease (HCC), Anxiety, Bilateral pneumonia (01/2017), COPD (chronic obstructive pulmonary disease) (HCC) (01/2017), Coronary artery disease, Depression, Fatty liver, Gallbladder disease (2014), Hernia, abdominal, Hypertension, Liver damage (01/2017), Normal cardiac stress test (2007), Ovarian cyst (1977), Pneumonia, Right ovarian cyst, and Stroke (HCC).   Surgical History    Past Surgical History:  Procedure Laterality Date  . ABDOMINAL HYSTERECTOMY Bilateral    Total  . CESAREAN SECTION    . CHOLECYSTECTOMY    . INGUINAL HERNIA REPAIR Right   . LEFT OOPHORECTOMY  1977   with  cyst removal, age 40  . MINOR EXCISION OF ORAL LESION N/A 10/07/2017   Procedure: IRRIGATION AND DEBRIDEMENT OF ORAL INFECTION, REMOVAL OF REMAINING 8 TEETH AND PLACEMENT OF PENROSE DRAINS;  Surgeon: Vivia Ewing, DMD;  Location: [facility] ORS;  Service: Oral Surgery;  Laterality: N/A;  . OVARIAN CYST REMOVAL Right 93, 94,95, 96  . THORACIC AORTIC ENDOVASCULAR STENT GRAFT N/A 05/11/2019   Procedure: THORACIC AORTIC ENDOVASCULAR STENT GRAFT, RETROPERITONEAL CONDUIT RIGHT ILIAC ARTERY;  Surgeon: Chuck Hint, MD;  Location: [facility] OR;  Service: Vascular;  Laterality: N/A;  . UMBILICAL HERNIA REPAIR       Social History   reports that she has been smoking cigarettes. She has a 46.00 pack-year smoking history. She has never used smokeless tobacco. She reports current alcohol use of about 1.0 standard drinks of alcohol per week. She reports previous drug use.   Family History   Her family history includes Breast cancer in her mother; COPD in her mother; Dementia in her mother; Diabetes  in her father, paternal grandmother, and sister; Heart attack in her father; Hepatitis C in her daughter; Stroke in her mother and sister.   Allergies Allergies  Allergen Reactions  . Amoxicillin Hives and Rash     Has patient had a PCN reaction causing immediate rash, facial/tongue/throat swelling, SOB or lightheadedness with hypotension: Yes, hives Has patient had a PCN reaction causing severe rash involving mucus membranes or skin necrosis: No Has patient had a PCN reaction that required hospitalization No Has patient had a PCN reaction occurring within the last 10 years: Yes If all of the above answers are "NO", then may proceed with Cephalosporin use.   . Clindamycin/Lincomycin Diarrhea and Other (See Comments)    Patient states that clindamycin causes diarrhea   . Ropinirole Other (See Comments)    "Made me insane" (per the patient)  . Wellbutrin [Bupropion] Diarrhea and Other (See Comments)    Lightheadedness, also  . Sulfamethoxazole Nausea And Vomiting and Rash  . Sulfonamide Derivatives Nausea And Vomiting and Rash     Home Medications  Prior to Admission medications   Medication Sig Start Date End Date Taking? Authorizing Provider  albuterol (PROVENTIL HFA;VENTOLIN HFA) 108 (90 Base) MCG/ACT inhaler Inhale 2 puffs into the lungs every 6 (six) hours as needed for wheezing or shortness of breath.   Yes [provider]  alendronate (FOSAMAX) 70 MG tablet Take with a full glass of water on an empty stomach. Patient taking differently: Take 70 mg by mouth every Wednesday.  05/06/19  Yes Ngetich, Dinah C, NP  aspirin EC 81 MG EC tablet Take 1 tablet (81 mg total) by mouth daily. 01/07/19  Yes Jae Dire, MD  atorvastatin (LIPITOR) 10 MG tablet Take 1 tablet (10 mg total) by mouth daily at 6 PM. 05/15/19  Yes Orpah Cobb, MD  citalopram (CELEXA) 40 MG tablet Take 0.5 tablets (20 mg total) by mouth daily for 30 days. 05/04/18 05/28/28 Yes Ngetich, Dinah C, NP  losartan  (COZAAR) 50 MG tablet Take 1 tablet (50 mg total) daily by mouth. 02/02/17  Yes Vassie Loll, MD  metFORMIN (GLUCOPHAGE) 500 MG tablet Take 0.5 tablets (250 mg total) by mouth daily with breakfast. 06/02/19  Yes Rinaldo Cloud, MD  nitroGLYCERIN (NITROSTAT) 0.4 MG SL tablet Place 1 tablet (0.4 mg total) under the tongue every 5 (five) minutes x 3 doses as needed for chest pain (chest pain). 02/18/17  Yes Orpah Cobb, MD  pantoprazole (PROTONIX) 40 MG tablet  Take 1 tablet (40 mg total) by mouth daily. 05/16/19  Yes Dixie Dials, MD  potassium chloride SA (KLOR-CON) 10 MEQ tablet Take 1 tablet (10 mEq total) by mouth daily. 05/15/19  Yes Dixie Dials, MD  budesonide-formoterol (SYMBICORT) 160-4.5 MCG/ACT inhaler Inhale 2 puffs into the lungs daily as needed (for flares).     [provider]  docusate sodium (COLACE) 100 MG capsule Take 1 capsule (100 mg total) by mouth daily. Patient not taking: Reported on 07/11/2019 05/15/19   Dixie Dials, MD  doxycycline (VIBRA-TABS) 100 MG tablet Take 1 tablet (100 mg total) by mouth 2 (two) times daily. Patient not taking: Reported on 07/11/2019 06/02/19   Charolette Forward, MD  gabapentin (NEURONTIN) 300 MG capsule Take 2 capsules (600 mg total) by mouth 2 (two) times daily. Take 600 mg by mouth in the morning and 600 mg at bedtime Patient taking differently: Take 900 mg by mouth in the morning and at bedtime.  02/19/19   Marcial Pacas, MD  guaiFENesin-dextromethorphan (ROBITUSSIN DM) 100-10 MG/5ML syrup Take 15 mLs by mouth every 4 (four) hours as needed for cough. Patient not taking: Reported on 07/11/2019 05/15/19   Dixie Dials, MD  HYDROcodone-acetaminophen (NORCO/VICODIN) 5-325 MG tablet Take 1 tablet by mouth every 4 (four) hours as needed for moderate pain. Patient not taking: Reported on 07/11/2019 05/15/19 05/14/20  Barbie Banner, PA-C  metoprolol tartrate (LOPRESSOR) 25 MG tablet Take 0.5 tablets (12.5 mg total) by mouth 2 (two) times daily. 06/02/19    Charolette Forward, MD  nicotine (NICODERM CQ - DOSED IN MG/24 HOURS) 21 mg/24hr patch Place 1 patch (21 mg total) onto the skin daily. Patient not taking: Reported on 07/11/2019 05/16/19   Dixie Dials, MD     Critical care time:    Pearson Grippe, DO IM PGY-3

## 2019-07-11 NOTE — Consult Note (Signed)
[address]       [place] [address]             [phone]                    Brittany [name] [facility] Medical Record #[id number] Date of Birth: [birth year]  Referring: No ref. provider found Primary Care: Ngetich, Donalee Citrin, NP Primary Cardiologist: No primary care provider on file.  Chief Complaint:    Chief Complaint  Patient presents with  . Chest Pain    History of Present Illness:    Brittany [name] 64 y.o. female presents to the emergency department with acute onset chest and back pain past 3 days.  She has a history of stent graft repair of a thoracic aneurysm in February 2021.  She represented in March with similar symptoms.  Cross-sectional imaging did not reveal any significant vascular pathology.  On today's scan she does have a proximal and distal pseudoaneurysm that is in close proximity to the stent.  Vascular surgery was initially consulted due to its proximity to the aortic arch, CT surgery was consulted to assist with management.  On review of the toxicology report from October 2020, she was positive for opioids, cocaine, marijuana, and amphetamines.  Approximately 2 weeks ago she had an episode of diarrhea and dark stools but has not had subjective fevers or chills since that point.    Past Medical History:  Diagnosis Date  . Alzheimer disease (HCC)   . Anxiety   . Bilateral pneumonia 01/2017   per new patient packet   . COPD (chronic obstructive pulmonary disease) (HCC) 01/2017   per new patient packet   . Coronary artery disease   . Depression   . Fatty liver   . Gallbladder disease 2014   per new patient packet   . Hernia, abdominal   . Hypertension   . Liver damage 01/2017   per new patient packet, Dr.Kadakia  . Normal cardiac stress test 2007  . Ovarian cyst 1977   8 1/2 lb left ovarian cyst, Dr.Cox, per new patient packet   . Pneumonia   . Right ovarian cyst    1992, 1993, 1994, 1995, and 1996 Dr.Neil, per new patient  packet   . Stroke (HCC)    4 strokes.     Past Surgical History:  Procedure Laterality Date  . ABDOMINAL HYSTERECTOMY Bilateral    Total  . CESAREAN SECTION    . CHOLECYSTECTOMY    . INGUINAL HERNIA REPAIR Right   . LEFT OOPHORECTOMY  1977   with cyst removal, age 89  . MINOR EXCISION OF ORAL LESION N/A 10/07/2017   Procedure: IRRIGATION AND DEBRIDEMENT OF ORAL INFECTION, REMOVAL OF REMAINING 8 TEETH AND PLACEMENT OF PENROSE DRAINS;  Surgeon: Brittany [name], DMD;  Location: [facility] ORS;  Service: Oral Surgery;  Laterality: N/A;  . OVARIAN CYST REMOVAL Right 93, 94,95, 96  . THORACIC AORTIC ENDOVASCULAR STENT GRAFT N/A 05/11/2019   Procedure: THORACIC AORTIC ENDOVASCULAR STENT GRAFT, RETROPERITONEAL CONDUIT RIGHT ILIAC ARTERY;  Surgeon: Brittany Hint, MD;  Location: [facility] OR;  Service: Vascular;  Laterality: N/A;  . UMBILICAL HERNIA REPAIR      Family History  Problem Relation Age of Onset  . Stroke Mother   . COPD Mother   . Dementia Mother   . Breast cancer Mother   . Diabetes Father   . Heart attack Father   . Diabetes Sister   .  Stroke Sister        x2  . Hepatitis C Daughter   . Diabetes Paternal Grandmother      Social History   Tobacco Use  Smoking Status Current Every Day Smoker  . Packs/day: 1.00  . Years: 46.00  . Pack years: 46.00  . Types: Cigarettes  Smokeless Tobacco Never Used    Social History   Substance and Sexual Activity  Alcohol Use Yes  . Alcohol/week: 1.0 standard drinks  . Types: 1 Cans of beer per week   Comment: once or twice a year.      Allergies  Allergen Reactions  . Amoxicillin Hives and Rash     Has patient had a PCN reaction causing immediate rash, facial/tongue/throat swelling, SOB or lightheadedness with hypotension: Yes, hives Has patient had a PCN reaction causing severe rash involving mucus membranes or skin necrosis: No Has patient had a PCN reaction that required hospitalization No Has patient had a PCN reaction  occurring within the last 10 years: Yes If all of the above answers are "NO", then may proceed with Cephalosporin use.   . Clindamycin/Lincomycin Diarrhea and Other (See Comments)    Patient states that clindamycin causes diarrhea   . Ropinirole Other (See Comments)    "Made me insane" (per the patient)  . Wellbutrin [Bupropion] Diarrhea and Other (See Comments)    Lightheadedness, also  . Sulfamethoxazole Nausea And Vomiting and Rash  . Sulfonamide Derivatives Nausea And Vomiting and Rash    Current Facility-Administered Medications  Medication Dose Route Frequency Provider Last Rate Last Admin  . albuterol (VENTOLIN HFA) 108 (90 Base) MCG/ACT inhaler 2 puff  2 puff Inhalation Q6H PRN Darrick Meigs, Rylee, MD      . atorvastatin (LIPITOR) tablet 10 mg  10 mg Oral q1800 Christian, Rylee, MD      . citalopram (CELEXA) tablet 20 mg  20 mg Oral Daily Christian, Rylee, MD      . docusate sodium (COLACE) capsule 100 mg  100 mg Oral Daily Christian, Rylee, MD      . docusate sodium (COLACE) capsule 100 mg  100 mg Oral BID PRN Christian, Rylee, MD      . esmolol (BREVIBLOC) 2000 mg / 100 mL (20 mg/mL) infusion  25-300 mcg/kg/min Intravenous Continuous Christian, Rylee, MD 8.25 mL/hr at 07/11/19 1202 50 mcg/kg/min at 07/11/19 1202  . insulin aspart (novoLOG) injection 1-3 Units  1-3 Units Subcutaneous Q4H Christian, Rylee, MD      . mometasone-formoterol (DULERA) 200-5 MCG/ACT inhaler 2 puff  2 puff Inhalation BID Christian, Rylee, MD      . morphine 4 MG/ML injection 4 mg  4 mg Intravenous Q4H PRN Christian, Rylee, MD      . nicotine (NICODERM CQ - dosed in mg/24 hours) patch 14 mg  14 mg Transdermal Once Christian, Rylee, MD      . pantoprazole (PROTONIX) injection 40 mg  40 mg Intravenous QHS Christian, Rylee, MD      . polyethylene glycol (MIRALAX / GLYCOLAX) packet 17 g  17 g Oral Daily PRN Brittany Hansen, MD       Current Outpatient Medications  Medication Sig Dispense Refill  . albuterol  (PROVENTIL HFA;VENTOLIN HFA) 108 (90 Base) MCG/ACT inhaler Inhale 2 puffs into the lungs every 6 (six) hours as needed for wheezing or shortness of breath.    Brittany [name] alendronate (FOSAMAX) 70 MG tablet Take with a full glass of water on an empty stomach. (Patient taking differently: Take 70 mg  by mouth every Wednesday. ) 12 tablet 0  . aspirin EC 81 MG EC tablet Take 1 tablet (81 mg total) by mouth daily. 90 tablet 0  . atorvastatin (LIPITOR) 10 MG tablet Take 1 tablet (10 mg total) by mouth daily at 6 PM. 30 tablet 3  . citalopram (CELEXA) 40 MG tablet Take 0.5 tablets (20 mg total) by mouth daily for 30 days. 30 tablet 3  . losartan (COZAAR) 50 MG tablet Take 1 tablet (50 mg total) daily by mouth. 30 tablet 1  . metFORMIN (GLUCOPHAGE) 500 MG tablet Take 0.5 tablets (250 mg total) by mouth daily with breakfast. 30 tablet 3  . nitroGLYCERIN (NITROSTAT) 0.4 MG SL tablet Place 1 tablet (0.4 mg total) under the tongue every 5 (five) minutes x 3 doses as needed for chest pain (chest pain). 25 tablet 1  . pantoprazole (PROTONIX) 40 MG tablet Take 1 tablet (40 mg total) by mouth daily. 30 tablet 1  . potassium chloride SA (KLOR-CON) 10 MEQ tablet Take 1 tablet (10 mEq total) by mouth daily. 30 tablet 3  . budesonide-formoterol (SYMBICORT) 160-4.5 MCG/ACT inhaler Inhale 2 puffs into the lungs daily as needed (for flares).     . docusate sodium (COLACE) 100 MG capsule Take 1 capsule (100 mg total) by mouth daily. (Patient not taking: Reported on 07/11/2019) 30 capsule 1  . doxycycline (VIBRA-TABS) 100 MG tablet Take 1 tablet (100 mg total) by mouth 2 (two) times daily. (Patient not taking: Reported on 07/11/2019) 20 tablet 0  . gabapentin (NEURONTIN) 300 MG capsule Take 2 capsules (600 mg total) by mouth 2 (two) times daily. Take 600 mg by mouth in the morning and 600 mg at bedtime (Patient taking differently: Take 900 mg by mouth in the morning and at bedtime. ) 120 capsule 11  . guaiFENesin-dextromethorphan  (ROBITUSSIN DM) 100-10 MG/5ML syrup Take 15 mLs by mouth every 4 (four) hours as needed for cough. (Patient not taking: Reported on 07/11/2019) 118 mL 0  . HYDROcodone-acetaminophen (NORCO/VICODIN) 5-325 MG tablet Take 1 tablet by mouth every 4 (four) hours as needed for moderate pain. (Patient not taking: Reported on 07/11/2019) 15 tablet 0  . metoprolol tartrate (LOPRESSOR) 25 MG tablet Take 0.5 tablets (12.5 mg total) by mouth 2 (two) times daily. 60 tablet 3  . nicotine (NICODERM CQ - DOSED IN MG/24 HOURS) 21 mg/24hr patch Place 1 patch (21 mg total) onto the skin daily. (Patient not taking: Reported on 07/11/2019) 28 patch 0    Review of Systems  Constitutional: Positive for malaise/fatigue. Negative for chills and fever.  Cardiovascular: Positive for chest pain.  Gastrointestinal: Positive for abdominal pain, diarrhea and vomiting.  Musculoskeletal: Positive for back pain.  Neurological: Negative.     PHYSICAL EXAMINATION: BP 138/71   Pulse 94   Temp (!) 97.5 F (36.4 C) (Oral)   Resp (!) 21   Ht 5\' 2"  (1.575 m)   Wt 55 kg   SpO2 100%   BMI 22.18 kg/m   Physical Exam  Constitutional: She is oriented to person, place, and time.  Disheveled   HENT:  Head: Normocephalic and atraumatic.  Eyes: Conjunctivae and EOM are normal.  Neck: No tracheal deviation present.  Cardiovascular: Normal rate and regular rhythm.  Pulmonary/Chest: Effort normal. No respiratory distress.  Abdominal: She exhibits no distension.  Musculoskeletal:        General: Normal range of motion.     Cervical back: Normal range of motion.  Neurological: She is alert  and oriented to person, place, and time.  Skin: Skin is warm and dry. She is not diaphoretic.     Diagnostic Studies & Laboratory data:     Recent Radiology Findings:   DG Chest 2 View  Result Date: 07/11/2019 CLINICAL DATA:  Chest pain EXAM: CHEST - 2 VIEW COMPARISON:  05/28/2019 FINDINGS: Heart is normal size. Aortic stent remains in  place, unchanged. Lungs clear. No effusions or acute bony abnormality. IMPRESSION: Prior stent graft of the descending thoracic aorta. No acute cardiopulmonary disease. Electronically Signed   By: Charlett Nose M.D.   On: 07/11/2019 02:56   CT Angio Chest/Abd/Pel for Dissection W and/or Wo Contrast  Result Date: 07/11/2019 CLINICAL DATA:  Shortness of breath for 3 days. Follow-up thoracic aortic aneurysm. History of stent graft. EXAM: CT ANGIOGRAPHY CHEST, ABDOMEN AND PELVIS TECHNIQUE: Non-contrast CT of the chest was initially obtained. Multidetector CT imaging through the chest, abdomen and pelvis was performed using the standard protocol during bolus administration of intravenous contrast. Multiplanar reconstructed images and MIPs were obtained and reviewed to evaluate the vascular anatomy. CONTRAST:  50mL OMNIPAQUE IOHEXOL 350 MG/ML SOLN COMPARISON:  05/28/2019 FINDINGS: CTA CHEST FINDINGS Cardiovascular: The heart is normal in size. No pericardial effusion. The ascending thoracic aorta is normal in caliber. No dissection. There is a new pseudoaneurysm or contained rupture at the aortic arch just proximal to the stent graft. This measures a maximum of 2.7 cm and is projecting off the inferior wall of the aortic arch. The stent graft is intact. The chronic surrounding hematoma is unchanged in size. No evidence of graft leakage. Just below the stent graft in the descending thoracic aorta is a second area of contained rupture or acute pseudoaneurysm formation which appear to be and 2 separate but adjacent area is along the left side of the aorta. The directly lateral outpouching measures a maximum of 17 mm and the more posterior outpouching measures 16 mm. No dissection. Mediastinum/Nodes: No mediastinal or hilar mass or adenopathy. The esophagus is grossly normal. Lungs/Pleura: Stable emphysematous changes but no infiltrates, edema or effusions. A few tiny scattered pulmonary nodules are stable. Minimal  dependent subpleural atelectasis. Musculoskeletal: No breast masses, chest wall mass, supraclavicular or axillary adenopathy. The thyroid gland is unremarkable. The bony thorax is intact. Review of the MIP images confirms the above findings. CTA ABDOMEN AND PELVIS FINDINGS VASCULAR Aorta: Stable atherosclerotic changes. Stable small pseudoaneurysms or small ulcerated plaques distally. No dissection. Celiac: Mild ostial calcifications. No stenosis. SMA: Normal Renals: Mild left ostial atherosclerotic change but no stenosis. IMA: Patent Inflow: Advanced atherosclerotic calcifications but no significant stenosis. Veins: Grossly normal. Review of the MIP images confirms the above findings. NON-VASCULAR Hepatobiliary: No focal hepatic lesions or intrahepatic biliary dilatation. The gallbladder is surgically absent. Mild associated common bile duct dilatation, stable. Pancreas: Moderate pancreatic atrophy but no mass or acute inflammation. Spleen: Normal size. No focal lesions. Adrenals/Urinary Tract: The adrenal glands and kidneys are unremarkable. No renal lesions or hydronephrosis. The bladder appears normal. Stomach/Bowel: The stomach, duodenum, small bowel and colon are grossly normal without oral contrast. No inflammatory changes, mass lesions or obstructive findings. Lymphatic: No mesenteric or retroperitoneal mass or adenopathy. Reproductive: Surgically absent. Other: Small anterior abdominal wall hernia containing fat. Musculoskeletal: No significant bony findings. Review of the MIP images confirms the above findings. IMPRESSION: 1. New pseudoaneurysms or contained ruptures just above and just below the thoracic aortic stent graft. No mediastinal hematoma. 2. Stable small pseudoaneurysms or small ulcerated plaques in  the distal abdominal aorta. No dissection. Abdominal aortic branch vessels are patent. 3. Stable emphysematous changes and pulmonary scarring. 4. No acute pulmonary findings. 5. No acute  abdominal/pelvic findings, mass lesions or lymphadenopathy. 6. Emphysema and aortic atherosclerosis. These results were called by telephone at the time of interpretation on 07/11/2019 at 5:42 am to provider Roxy Horseman , who verbally acknowledged these results. Aortic Atherosclerosis (ICD10-I70.0) and Emphysema (ICD10-J43.9). Electronically Signed   By: Rudie Meyer M.D.   On: 07/11/2019 05:45       I have independently reviewed the above radiology studies  and reviewed the findings with the patient.   Recent Lab Findings: Lab Results  Component Value Date   WBC 12.1 (H) 07/11/2019   HGB 8.4 (L) 07/11/2019   HCT 29.2 (L) 07/11/2019   PLT 476 (H) 07/11/2019   GLUCOSE 194 (H) 07/11/2019   CHOL 102 05/29/2019   TRIG 103 05/29/2019   HDL 23 (L) 05/29/2019   LDLCALC 58 05/29/2019   ALT 8 05/28/2019   AST 12 (L) 05/28/2019   NA 135 07/11/2019   K 4.6 07/11/2019   CL 102 07/11/2019   CREATININE 0.54 07/11/2019   BUN 11 07/11/2019   CO2 21 (L) 07/11/2019   TSH 1.840 07/12/2013   INR 1.1 07/11/2019   HGBA1C 6.7 (H) 05/29/2019         Assessment / Plan:   64 year old female status post stent graft repair a 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  The case was discussed with multiple members of the vascular team as well as my partner Dr. Vickey Sages, and the main determining factor for surgical repair is whether or not stent graft is truly infected.  Blood cultures are currently pending.  She will require total arch replacement potentially with homograft to address the proximal pseudoaneurysm.  Would need to delineate a plan in regards to coverage of the distal pseudoaneurysm.  Options include antegrade stent graft placement at the time of surgery versus total aortic replacement.  She will be admitted to the ICU for blood pressure control while we  await cultures and order the appropriate supplies for surgery.      Corliss Skains 07/11/2019 12:08 PM

## 2019-07-11 NOTE — Consult Note (Signed)
[facility]  Patient name: Brittany [name] MRN: [id number] DOB: [birth year] Sex: female    HPI: Brittany [name] is a 64 y.o. female presenting to the emergency room with recurrent chest pain.  She had presented on May 11, 2019 with chest pain and a CT scan showed descending thoracic aortic aneurysm.  She was seen by Dr.Dickson and was taken urgently to the operating room for stent graft repair of her thoracic aneurysm and intramural hematoma.  She is extremely small weighing 55 kg and she had a right retroperitoneal exposure and right iliac artery conduit for access.  She was discharged home on postop day 4.  She had presented back to the emergency room on May 28, 2019.  She had had chest pain and underwent CT scan.  There was a great deal of motion artifact but she did not appear to have any evidence of endoleak at that time.  We were not consulted.  She now presents with a several day history of worsening chest pain.  It has been relieved by narcotics but was not relieved with Tylenol at home.  In questioning her regarding fevers or chills she reports a episode 1 to 2 weeks ago where she had diarrhea and dark stools and chills.  Past Medical History:  Diagnosis Date  . Alzheimer disease (HCC)   . Anxiety   . Bilateral pneumonia 01/2017   per new patient packet   . COPD (chronic obstructive pulmonary disease) (HCC) 01/2017   per new patient packet   . Coronary artery disease   . Depression   . Fatty liver   . Gallbladder disease 2014   per new patient packet   . Hernia, abdominal   . Hypertension   . Liver damage 01/2017   per new patient packet, Dr.Kadakia  . Normal cardiac stress test 2007  . Ovarian cyst 1977   8 1/2 lb left ovarian cyst, Dr.Cox, per new patient packet   . Pneumonia   . Right ovarian cyst    1992, 1993, 1994, 1995, and 1996 Dr.Neil, per new patient packet   . Stroke (HCC)    4 strokes.      Family History  Problem Relation Age of Onset  . Stroke Mother   . COPD Mother   . Dementia Mother   . Breast cancer Mother   . Diabetes Father   . Heart attack Father   . Diabetes Sister   . Stroke Sister        x2  . Hepatitis C Daughter   . Diabetes Paternal Grandmother     SOCIAL HISTORY: Social History   Tobacco Use  . Smoking status: Current Every Day Smoker    Packs/day: 1.00    Years: 46.00    Pack years: 46.00    Types: Cigarettes  . Smokeless tobacco: Never Used  Substance Use Topics  . Alcohol use: Yes    Alcohol/week: 1.0 standard drinks    Types: 1 Cans of beer per week    Comment: once or twice a year.     Allergies  Allergen Reactions  . Amoxicillin Hives and Rash     Has patient had a PCN reaction causing immediate rash, facial/tongue/throat swelling, SOB or lightheadedness with hypotension: Yes, hives Has  patient had a PCN reaction causing severe rash involving mucus membranes or skin necrosis: No Has patient had a PCN reaction that required hospitalization No Has patient had a PCN reaction occurring within the last 10 years: Yes If all of the above answers are "NO", then may proceed with Cephalosporin use.   . Clindamycin/Lincomycin Diarrhea and Other (See Comments)    Patient states that clindamycin causes diarrhea   . Ropinirole Other (See Comments)    "Made me insane" (per the patient)  . Wellbutrin [Bupropion] Diarrhea and Other (See Comments)    Lightheadedness, also  . Sulfamethoxazole Nausea And Vomiting and Rash  . Sulfonamide Derivatives Nausea And Vomiting and Rash    No current facility-administered medications for this encounter.   Current Outpatient Medications  Medication Sig Dispense Refill  . albuterol (PROVENTIL HFA;VENTOLIN HFA) 108 (90 Base) MCG/ACT inhaler Inhale 2 puffs into the lungs every 6 (six) hours as needed for wheezing or shortness of breath.    Marland Kitchen alendronate (FOSAMAX) 70 MG tablet Take with a full glass of  water on an empty stomach. (Patient taking differently: Take 70 mg by mouth every Wednesday. ) 12 tablet 0  . aspirin EC 81 MG EC tablet Take 1 tablet (81 mg total) by mouth daily. 90 tablet 0  . atorvastatin (LIPITOR) 10 MG tablet Take 1 tablet (10 mg total) by mouth daily at 6 PM. 30 tablet 3  . citalopram (CELEXA) 40 MG tablet Take 0.5 tablets (20 mg total) by mouth daily for 30 days. 30 tablet 3  . losartan (COZAAR) 50 MG tablet Take 1 tablet (50 mg total) daily by mouth. 30 tablet 1  . metFORMIN (GLUCOPHAGE) 500 MG tablet Take 0.5 tablets (250 mg total) by mouth daily with breakfast. 30 tablet 3  . nitroGLYCERIN (NITROSTAT) 0.4 MG SL tablet Place 1 tablet (0.4 mg total) under the tongue every 5 (five) minutes x 3 doses as needed for chest pain (chest pain). 25 tablet 1  . pantoprazole (PROTONIX) 40 MG tablet Take 1 tablet (40 mg total) by mouth daily. 30 tablet 1  . potassium chloride SA (KLOR-CON) 10 MEQ tablet Take 1 tablet (10 mEq total) by mouth daily. 30 tablet 3  . budesonide-formoterol (SYMBICORT) 160-4.5 MCG/ACT inhaler Inhale 2 puffs into the lungs daily as needed (for flares).     . docusate sodium (COLACE) 100 MG capsule Take 1 capsule (100 mg total) by mouth daily. (Patient not taking: Reported on 07/11/2019) 30 capsule 1  . doxycycline (VIBRA-TABS) 100 MG tablet Take 1 tablet (100 mg total) by mouth 2 (two) times daily. (Patient not taking: Reported on 07/11/2019) 20 tablet 0  . gabapentin (NEURONTIN) 300 MG capsule Take 2 capsules (600 mg total) by mouth 2 (two) times daily. Take 600 mg by mouth in the morning and 600 mg at bedtime (Patient taking differently: Take 900 mg by mouth in the morning and at bedtime. ) 120 capsule 11  . guaiFENesin-dextromethorphan (ROBITUSSIN DM) 100-10 MG/5ML syrup Take 15 mLs by mouth every 4 (four) hours as needed for cough. (Patient not taking: Reported on 07/11/2019) 118 mL 0  . HYDROcodone-acetaminophen (NORCO/VICODIN) 5-325 MG tablet Take 1 tablet by  mouth every 4 (four) hours as needed for moderate pain. (Patient not taking: Reported on 07/11/2019) 15 tablet 0  . metoprolol tartrate (LOPRESSOR) 25 MG tablet Take 0.5 tablets (12.5 mg total) by mouth 2 (two) times daily. 60 tablet 3  . nicotine (NICODERM CQ - DOSED IN MG/24 HOURS) 21 mg/24hr  patch Place 1 patch (21 mg total) onto the skin daily. (Patient not taking: Reported on 07/11/2019) 28 patch 0    REVIEW OF SYSTEMS:  [X]  denotes positive finding, [ ]  denotes negative finding Cardiac  Comments:  Chest pain or chest pressure: x   Shortness of breath upon exertion:    Short of breath when lying flat:    Irregular heart rhythm:        Vascular    Pain in calf, thigh, or hip brought on by ambulation:    Pain in feet at night that wakes you up from your sleep:     Blood clot in your veins:    Leg swelling:           PHYSICAL EXAM: Vitals:   07/11/19 0615 07/11/19 0630 07/11/19 0645 07/11/19 0700  BP: (!) 125/110 125/81 (!) 138/94 130/77  Pulse: (!) 106 100 (!) 109 (!) 104  Resp: (!) 25 16 (!) 22 16  Temp:      TempSrc:      SpO2: 100%  99% 95%  Weight:      Height:        GENERAL: The patient is a well-nourished female, in no acute distress. The vital signs are documented above. CARDIOVASCULAR: 2+ radial and 2+ dorsalis pedis pulses bilaterally.  Abdomen soft.  Right lower quadrant access site well-healed PULMONARY: There is good air exchange  MUSCULOSKELETAL: There are no major deformities or cyanosis. NEUROLOGIC: No focal weakness or paresthesias are detected. SKIN: There are no ulcers or rashes noted. PSYCHIATRIC: The patient has a normal affect.  DATA:  CT scan from today reveals false aneurysm with degeneration proximal in the arch and distal to the stent graft.  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  Her film on 05/11/2019 showed a very irregular mid descending thoracic aneurysm.  Will review films with my partners and also discussed with CT surgery  regarding options.  Discussed the gravity of the situation with the patient.    Rosetta Posner, MD FACS [facility] Tel [phone] Pager [phone]

## 2019-07-11 NOTE — Progress Notes (Signed)
Pharmacy Antibiotic Note  Brittany [name] is a 64 y.o. 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  Pharmacy has been consulted for Cefepime and vancomycin dosing. Pt is also being started on Flagyl. WBC elevated at 12.1. SCr wnl.   Plan: -Cefepime 2 gm IV Q 8 hours -Vancomycin 1250 mg IV Q 24 hours Goal AUC 400-550. Expected AUC: 484 SCr used: 0.7 -Flagyl 500 mg IV Q 8 hours -Monitor CBC, renal fx, cultures and clinical progress -Vanc levels as indicated    Height: 5\' 2"  (157.5 cm) Weight: 55 kg (121 lb 4.1 oz) IBW/kg (Calculated) : 50.1  Temp (24hrs), Avg:97.5 F (36.4 C), Min:97.5 F (36.4 C), Max:97.5 F (36.4 C)  Recent Labs  Lab 07/11/19 0234  WBC 12.1*  CREATININE 0.54    Estimated Creatinine Clearance: 58.4 mL/min (by C-G formula based on SCr of 0.54 mg/dL).    Allergies  Allergen Reactions  . Amoxicillin Hives and Rash     Has patient had a PCN reaction causing immediate rash, facial/tongue/throat swelling, SOB or lightheadedness with hypotension: Yes, hives Has patient had a PCN reaction causing severe rash involving mucus membranes or skin necrosis: No Has patient had a PCN reaction that required hospitalization No Has patient had a PCN reaction occurring within the last 10 years: Yes If all of the above answers are "NO", then may proceed with Cephalosporin use.   . Clindamycin/Lincomycin Diarrhea and Other (See Comments)    Patient states that clindamycin causes diarrhea   . Ropinirole Other (See Comments)    "Made me insane" (per the patient)  . Wellbutrin [Bupropion] Diarrhea and Other (See Comments)    Lightheadedness, also  . Sulfamethoxazole Nausea And Vomiting and Rash  . Sulfonamide Derivatives Nausea And Vomiting and Rash    Antimicrobials this admission: Cefepime 4/22 >>  Vanc 4/22 >>  Metronidazole 4/22>>  Dose adjustments this admission:   Microbiology results:   Thank you for allowing pharmacy to be a part of this  patient's care.  5/22, PharmD., BCPS, BCCCP Clinical Pharmacist Clinical phone for 07/11/19 until 3:30pm: [phone] If after 3:30pm, please refer to [facility] for unit-specific pharmacist

## 2019-07-11 NOTE — ED Notes (Signed)
New EKG captured, Dr. Nicanor Alcon signed, RN placed EKG on Dr. Harland Dingwall desk.

## 2019-07-11 NOTE — ED Triage Notes (Signed)
Patient arrived with EMS from home reports central chest pain with SOB for 3 days , denies emesis or diaphoresis , her cardiologist is Dr. Algie Coffer , no cough or fever . She received 3 NTG sl and ASA 324 mg prior to arrival with no relief .

## 2019-07-11 NOTE — ED Notes (Signed)
Pt placed on hospital bed

## 2019-07-11 NOTE — ED Provider Notes (Signed)
Attestation: Medical screening examination/treatment/procedure(s) were conducted as a shared visit with non-physician practitioner(s) and myself.  I personally evaluated the patient during the encounter.   Briefly, the patient is a 64 y.o. 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   Vitals:   07/11/19 2210 07/11/19 2220  BP: 120/60 114/73  Pulse: 77 73  Resp: (!) 23 20  Temp:    SpO2: 100% 100%    CONSTITUTIONAL: Chronically ill but nontoxic-appearing, NAD NEURO:  Alert and oriented x 3, no focal deficits EYES:  pupils equal and reactive ENT/NECK:  trachea midline, no JVD CARDIO: Regular rate, regular rhythm, well-perfused PULM: None labored breathing GI/GU:  Abdomin non-distended MSK/SPINE:  No gross deformities, no edema SKIN:  no rash, atraumatic PSYCH:  Appropriate speech and behavior   EKG Interpretation  Date/Time:  Thursday July 11 2019 03:43:01 EDT Ventricular Rate:  106 PR Interval:    QRS Duration: 73 QT Interval:  365 QTC Calculation: 485 R Axis:   64 Text Interpretation: Sinus tachycardia Anteroseptal infarct, age indeterminate No significant change since last tracing Confirmed by Drema Pry [phone]) on 07/11/2019 5:36:25 AM       Work up notable for pseudoaneurysms of the ascending aorta above and below the graft.  Cardiothoracic surgery consulted who evaluate the patient in the emergency department.  Plan is to take patient for operative management.  Currently blood pressure and heart rate controlled.   CRITICAL CARE Performed by: Amadeo Garnet Renaye Janicki Total critical care time: 15 minutes Critical care time was exclusive of separately billable procedures and treating other patients. Critical care was necessary to treat or prevent imminent or life-threatening deterioration. Critical care was time spent personally by me on the following  activities: development of treatment plan with patient and/or surrogate as well as nursing, discussions with consultants, evaluation of patient's response to treatment, examination of patient, obtaining history from patient or surrogate, ordering and performing treatments and interventions, ordering and review of laboratory studies, ordering and review of radiographic studies, pulse oximetry and re-evaluation of patient's condition.       Nira Conn, MD 07/11/19 2238

## 2019-07-11 NOTE — ED Notes (Addendum)
Pt requesting pain medication & nicotine patch. Vascular notified.

## 2019-07-12 ENCOUNTER — Encounter (HOSPITAL_COMMUNITY): Payer: Self-pay | Admitting: Pulmonary Disease

## 2019-07-12 DIAGNOSIS — I160 Hypertensive urgency: Secondary | ICD-10-CM

## 2019-07-12 DIAGNOSIS — I719 Aortic aneurysm of unspecified site, without rupture: Secondary | ICD-10-CM

## 2019-07-12 LAB — PHOSPHORUS: Phosphorus: 4.5 mg/dL (ref 2.5–4.6)

## 2019-07-12 LAB — RAPID URINE DRUG SCREEN, HOSP PERFORMED
Amphetamines: NOT DETECTED
Barbiturates: NOT DETECTED
Benzodiazepines: NOT DETECTED
Cocaine: POSITIVE — AB
Opiates: POSITIVE — AB
Tetrahydrocannabinol: NOT DETECTED

## 2019-07-12 LAB — GLUCOSE, CAPILLARY
Glucose-Capillary: 103 mg/dL — ABNORMAL HIGH (ref 70–99)
Glucose-Capillary: 110 mg/dL — ABNORMAL HIGH (ref 70–99)
Glucose-Capillary: 120 mg/dL — ABNORMAL HIGH (ref 70–99)
Glucose-Capillary: 128 mg/dL — ABNORMAL HIGH (ref 70–99)
Glucose-Capillary: 97 mg/dL (ref 70–99)
Glucose-Capillary: 97 mg/dL (ref 70–99)

## 2019-07-12 LAB — CBC
HCT: 27.4 % — ABNORMAL LOW (ref 36.0–46.0)
Hemoglobin: 8.1 g/dL — ABNORMAL LOW (ref 12.0–15.0)
MCH: 24.5 pg — ABNORMAL LOW (ref 26.0–34.0)
MCHC: 29.6 g/dL — ABNORMAL LOW (ref 30.0–36.0)
MCV: 83 fL (ref 80.0–100.0)
Platelets: 473 10*3/uL — ABNORMAL HIGH (ref 150–400)
RBC: 3.3 MIL/uL — ABNORMAL LOW (ref 3.87–5.11)
RDW: 19.4 % — ABNORMAL HIGH (ref 11.5–15.5)
WBC: 10.6 10*3/uL — ABNORMAL HIGH (ref 4.0–10.5)
nRBC: 0 % (ref 0.0–0.2)

## 2019-07-12 LAB — BASIC METABOLIC PANEL
Anion gap: 10 (ref 5–15)
BUN: 12 mg/dL (ref 8–23)
CO2: 20 mmol/L — ABNORMAL LOW (ref 22–32)
Calcium: 8.6 mg/dL — ABNORMAL LOW (ref 8.9–10.3)
Chloride: 106 mmol/L (ref 98–111)
Creatinine, Ser: 0.64 mg/dL (ref 0.44–1.00)
GFR calc Af Amer: >60 mL/min (ref >60)
GFR calc non Af Amer: >60 mL/min (ref >60)
Glucose, Bld: 112 mg/dL — ABNORMAL HIGH (ref 70–99)
Potassium: 4.9 mmol/L (ref 3.5–5.1)
Sodium: 136 mmol/L (ref 135–145)

## 2019-07-12 LAB — MAGNESIUM: Magnesium: 1.9 mg/dL (ref 1.7–2.4)

## 2019-07-12 MED ORDER — NICOTINE 14 MG/24HR TD PT24
14.0000 mg | MEDICATED_PATCH | Freq: Every day | TRANSDERMAL | Status: DC
  Administered 2019-07-12 – 2019-07-24 (×12): 14 mg via TRANSDERMAL
  Filled 2019-07-12 (×12): qty 1

## 2019-07-12 MED ORDER — CHLORHEXIDINE GLUCONATE CLOTH 2 % EX PADS
6.0000 | MEDICATED_PAD | Freq: Every day | CUTANEOUS | Status: DC
  Administered 2019-07-12 – 2019-07-13 (×2): 6 via TOPICAL

## 2019-07-12 MED ORDER — PANTOPRAZOLE SODIUM 40 MG PO TBEC
40.0000 mg | DELAYED_RELEASE_TABLET | Freq: Every day | ORAL | Status: DC
  Administered 2019-07-12 – 2019-07-14 (×3): 40 mg via ORAL
  Filled 2019-07-12 (×3): qty 1

## 2019-07-12 MED ORDER — MORPHINE SULFATE (PF) 2 MG/ML IV SOLN
1.0000 mg | INTRAVENOUS | Status: DC | PRN
  Administered 2019-07-12 – 2019-07-15 (×22): 2 mg via INTRAVENOUS
  Filled 2019-07-12 (×24): qty 1

## 2019-07-12 MED ORDER — METOPROLOL TARTRATE 12.5 MG HALF TABLET
12.5000 mg | ORAL_TABLET | Freq: Two times a day (BID) | ORAL | Status: DC
  Administered 2019-07-12 – 2019-07-14 (×6): 12.5 mg via ORAL
  Filled 2019-07-12 (×6): qty 1

## 2019-07-12 MED ORDER — SODIUM CHLORIDE 0.9 % IV SOLN
INTRAVENOUS | Status: DC | PRN
  Administered 2019-07-13: 1000 mL via INTRAVENOUS

## 2019-07-12 MED ORDER — LOSARTAN POTASSIUM 50 MG PO TABS
50.0000 mg | ORAL_TABLET | Freq: Every day | ORAL | Status: DC
  Administered 2019-07-12 – 2019-07-14 (×3): 50 mg via ORAL
  Filled 2019-07-12 (×3): qty 1

## 2019-07-12 MED ORDER — IPRATROPIUM-ALBUTEROL 0.5-2.5 (3) MG/3ML IN SOLN: 3.0000 mL | Freq: Four times a day (QID) | RESPIRATORY_TRACT | Status: DC | PRN

## 2019-07-12 NOTE — Progress Notes (Signed)
NAME:  Brittany [name], MRN:  [id number], DOB:  Jan 31, 1956, LOS: 1 ADMISSION DATE:  07/11/2019, CONSULTATION DATE:  07/11/2019 REFERRING MD:  Theda Belfast, CHIEF COMPLAINT:  Chest pain.   Brief History   Mrs Brittany [name] is a 64 yo F with Hx of Thoracic Aortic Aneurysm (s/p repair/stenting Feb 2021), HTN, Substance Use, CVA, GERD, COPD, Anxiety/Depresion, Hep C, and Neuropathy who presents with 3 days of worsening chest pain. Reports cocaine use once last week. CTA in ED showed pseudoaneurysms at distal and proximal ends of graft. Vascular surgery was consulted who will discuss case with CT surgery. 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 PCCM asked to admit to ICU on infusion for BP control.  History of present illness   Mrs Brittany [name] is a 64 yo F with Hx of Thoracic Aortic Aneurysm (s/p repair/stenting Feb 2021), HTN, Substance Use, CVA, GERD, COPD, Anxiety/Depresion, Hep C, and Neuropathy who presents with 3 days of worsening chest pain. Pain began 3 days ago when pushing her garbage can to the curb. It has been constant since that time and became severe 910/10) one day ago. The pain is in her central chest and, at times, radiates to her back. She reports 5 days of dark stools, nausea and subjective fever about 2 weeks ago. Denies current fevers, chills, SOB, N/V, Diarrhea, H/A, vision changes.  In ED CTA was performed, found to have pseudoaneurysms at distal and proximal ends of graft. Vascular surgery was consulted who will discuss case with CT surgery. PCCM asked to admit to ICU on infusion for BP control.  Past Medical History  Thoracic Aortic Aneurysm (s/p repair/stenting Feb 2021) HTN Substance Use - opiates, cocaine, THC  CVA GERD COPD Anxiety/Depresion Hep C (cleared per chart) Neuropathy  Significant Hospital Events   4/22- Admit  Consults:  VSS, TCTS   Procedures:  None  Significant Diagnostic Tests:  4/22 CTA C/A/P: IMPRESSION: 1. New  pseudoaneurysms or contained ruptures just above and just below the thoracic aortic stent graft. No mediastinal hematoma. 2. Stable small pseudoaneurysms or small ulcerated plaques in the distal abdominal aorta. No dissection. Abdominal aortic branch vessels are patent. 3. Stable emphysematous changes and pulmonary scarring. 4. No acute pulmonary findings. 5. No acute abdominal/pelvic findings, mass lesions or lymphadenopathy. 6. Emphysema and aortic atherosclerosis.  Micro Data:  4/22 RVP negative  4/22 Blood Cultures: Pending  Antimicrobials:  Vancomycin 4/22 > Cefepime 4/22 > Flagyl 4/22  Interim history/subjective:  Pain poorly controlled overnight and morphine frequency increased to q2h.   Objective   Blood pressure (!) 98/59, pulse 80, temperature 98.3 F (36.8 C), temperature source Oral, resp. rate 19, height 5\' 2"  (1.575 m), weight 55 kg, SpO2 97 %.        Intake/Output Summary (Last 24 hours) at 07/12/2019 07/14/2019 Last data filed at 07/12/2019 0600 Gross per 24 hour  Intake 1290.98 ml  Output 200 ml  Net 1090.98 ml   Filed Weights   07/11/19 0152  Weight: 55 kg   Examination General: elderly female, sleeping in bed in no acute distress  HENT: NCAT, PERRL, OP clear, MMM Lungs: CTAB, no wheezes or crackles  Cardiovascular: RRR, no mrg Abdomen: soft, NTND, normoactive bowel sounds  Extremities: warm and well perfused  Neuro: arouses easily to voice, fully oriented, moving all 4 extremities   Resolved Hospital Problem list   None  Assessment & Plan:   # Thoracic Aortic Aneurysm (s/p repair and stenting in Feb 2021) with new pseudoaneurysms  at proximal and distal ends of graft concerning for mycotic aneurysm  # HTN  There is concern these pseudoaneurysms are mycotic due to elevated WBC and rapid progression. Blood cultures have been collected and are pending. She has also been started on broad spectrum antibiotics which we will continue pending culture results.  However, antifungal was not started, unclear why. In addition, her leukocytosis seems to be chronic. TCTS and VVS have been consulted. We are awaiting recommendations. In the meantime we will continue to control BP.  -- TCTS and VVS following  -- Esmolol gtt with goal sBP 105-120 and HR < 80 -- Will resume home metoprolol 12.5 mg BID and losartan 50 mg QD and plan to wean off esmolol drip  -- Follow up blood cultures -- Continue broad spectrum antibiotics with vancomycin and cefepime -- Discontinue Flagyl  -- Continue  Morphine 2-4mg  q2h for pain control  -- Switch from NPO to [place]   # COPD:  -- PFTs from 2019, FEV1 72, FEV1/FVC 92 -- She does not use any inhalers at home -- On duonebs PRN   # Substance Use -- Reports intermittent THC and cocaine use (last use of cocaine was last week), was unsure about amphetamines. Prior UDS (01/05/2019) + for opiates (prescribed), cocaine, THC,  amphetamines -- Counseled cessation as cocaine and amphetamines could cause death in the setting of her Aneurysm.  Anxiety and Depression: Continue home Celexa Hx CVA: Continue home atorvastatin, holding ASA  Neuropathy: Managing pain with opiates as above  GERD: Switched IV protonix to PO   Best practice:  Diet: NPO Pain/Anxiety/Delirium protocol (if indicated): Morphine PRN VAP protocol (if indicated): N/A DVT prophylaxis: SCDs GI prophylaxis: PO PPI Glucose control: SSI Mobility: BR Code Status: Full Family Communication: Discussed with patient Disposition: ICU  Labs   CBC: Recent Labs  Lab 07/11/19 0234 07/12/19 0234  WBC 12.1* 10.6*  HGB 8.4* 8.1*  HCT 29.2* 27.4*  MCV 84.6 83.0  PLT 476* 473*    Basic Metabolic Panel: Recent Labs  Lab 07/11/19 0234 07/12/19 0234  NA 135 136  K 4.6 4.9  CL 102 106  CO2 21* 20*  GLUCOSE 194* 112*  BUN 11 12  CREATININE 0.54 0.64  CALCIUM 8.5* 8.6*  MG  --  1.9  PHOS  --  4.5   GFR: Estimated Creatinine Clearance: 58.4 mL/min (by C-G  formula based on SCr of 0.64 mg/dL). Recent Labs  Lab 07/11/19 0234 07/12/19 0234  WBC 12.1* 10.6*    Liver Function Tests: No results for input(s): AST, ALT, ALKPHOS, BILITOT, PROT, ALBUMIN in the last 168 hours. No results for input(s): LIPASE, AMYLASE in the last 168 hours. No results for input(s): AMMONIA in the last 168 hours.  ABG    Component Value Date/Time   HCO3 22.6 01/05/2019 1509   TCO2 27 05/28/2019 2202   ACIDBASEDEF 6.0 (H) 01/05/2019 1509   O2SAT 87.0 01/05/2019 1509     Coagulation Profile: Recent Labs  Lab 07/11/19 0234  INR 1.1    Cardiac Enzymes: No results for input(s): CKTOTAL, CKMB, CKMBINDEX, TROPONINI in the last 168 hours.  HbA1C: Hgb A1c MFr Bld  Date/Time Value Ref Range Status  05/29/2019 04:40 AM 6.7 (H) 4.8 - 5.6 % Final    Comment:    (NOTE) Pre diabetes:          5.7%-6.4% Diabetes:              >6.4% Glycemic control for   <7.0%  adults with diabetes   01/06/2019 06:28 AM 5.8 (H) 4.8 - 5.6 % Final    Comment:    (NOTE)         Prediabetes: 5.7 - 6.4         Diabetes: >6.4         Glycemic control for adults with diabetes: <7.0     CBG: Recent Labs  Lab 07/11/19 1228 07/11/19 1617 07/11/19 1940 07/11/19 2351 07/12/19 0338  GLUCAP 111* 116* 124* 123* 110*    Review of Systems:   Performed and negative except as listed in HPI and interim/subjective.   Past Medical History  She,  has a past medical history of Alzheimer disease (HCC), Anxiety, Bilateral pneumonia (01/2017), COPD (chronic obstructive pulmonary disease) (HCC) (01/2017), Coronary artery disease, Depression, Fatty liver, Gallbladder disease (2014), Hernia, abdominal, Hypertension, Liver damage (01/2017), Normal cardiac stress test (2007), Ovarian cyst (1977), Pneumonia, Right ovarian cyst, and Stroke (HCC).   Surgical History    Past Surgical History:  Procedure Laterality Date  . ABDOMINAL HYSTERECTOMY Bilateral    Total  . CESAREAN SECTION    .  CHOLECYSTECTOMY    . INGUINAL HERNIA REPAIR Right   . LEFT OOPHORECTOMY  1977   with cyst removal, age 5  . MINOR EXCISION OF ORAL LESION N/A 10/07/2017   Procedure: IRRIGATION AND DEBRIDEMENT OF ORAL INFECTION, REMOVAL OF REMAINING 8 TEETH AND PLACEMENT OF PENROSE DRAINS;  Surgeon: Vivia Ewing, DMD;  Location: [facility] ORS;  Service: Oral Surgery;  Laterality: N/A;  . OVARIAN CYST REMOVAL Right 93, 94,95, 96  . THORACIC AORTIC ENDOVASCULAR STENT GRAFT N/A 05/11/2019   Procedure: THORACIC AORTIC ENDOVASCULAR STENT GRAFT, RETROPERITONEAL CONDUIT RIGHT ILIAC ARTERY;  Surgeon: Chuck Hint, MD;  Location: [facility] OR;  Service: Vascular;  Laterality: N/A;  . UMBILICAL HERNIA REPAIR       Social History   reports that she has been smoking cigarettes. She has a 46.00 pack-year smoking history. She has never used smokeless tobacco. She reports current alcohol use of about 1.0 standard drinks of alcohol per week. She reports previous drug use.   Family History   Her family history includes Breast cancer in her mother; COPD in her mother; Dementia in her mother; Diabetes in her father, paternal grandmother, and sister; Heart attack in her father; Hepatitis C in her daughter; Stroke in her mother and sister.   Allergies Allergies  Allergen Reactions  . Amoxicillin Hives and Rash     Has patient had a PCN reaction causing immediate rash, facial/tongue/throat swelling, SOB or lightheadedness with hypotension: Yes, hives Has patient had a PCN reaction causing severe rash involving mucus membranes or skin necrosis: No Has patient had a PCN reaction that required hospitalization No Has patient had a PCN reaction occurring within the last 10 years: Yes If all of the above answers are "NO", then may proceed with Cephalosporin use.   . Clindamycin/Lincomycin Diarrhea and Other (See Comments)    Patient states that clindamycin causes diarrhea   . Ropinirole Other (See Comments)    "Made me insane" (per  the patient)  . Wellbutrin [Bupropion] Diarrhea and Other (See Comments)    Lightheadedness, also  . Sulfamethoxazole Nausea And Vomiting and Rash  . Sulfonamide Derivatives Nausea And Vomiting and Rash     Home Medications  Prior to Admission medications   Medication Sig Start Date End Date Taking? Authorizing Provider  albuterol (PROVENTIL HFA;VENTOLIN HFA) 108 (90 Base) MCG/ACT inhaler Inhale 2 puffs into the  lungs every 6 (six) hours as needed for wheezing or shortness of breath.   Yes [provider]  alendronate (FOSAMAX) 70 MG tablet Take with a full glass of water on an empty stomach. Patient taking differently: Take 70 mg by mouth every Wednesday.  05/06/19  Yes Ngetich, Dinah C, NP  aspirin EC 81 MG EC tablet Take 1 tablet (81 mg total) by mouth daily. 01/07/19  Yes Jae Dire, MD  atorvastatin (LIPITOR) 10 MG tablet Take 1 tablet (10 mg total) by mouth daily at 6 PM. 05/15/19  Yes Orpah Cobb, MD  citalopram (CELEXA) 40 MG tablet Take 0.5 tablets (20 mg total) by mouth daily for 30 days. 05/04/18 05/28/28 Yes Ngetich, Dinah C, NP  losartan (COZAAR) 50 MG tablet Take 1 tablet (50 mg total) daily by mouth. 02/02/17  Yes Vassie Loll, MD  metFORMIN (GLUCOPHAGE) 500 MG tablet Take 0.5 tablets (250 mg total) by mouth daily with breakfast. 06/02/19  Yes Rinaldo Cloud, MD  nitroGLYCERIN (NITROSTAT) 0.4 MG SL tablet Place 1 tablet (0.4 mg total) under the tongue every 5 (five) minutes x 3 doses as needed for chest pain (chest pain). 02/18/17  Yes Orpah Cobb, MD  pantoprazole (PROTONIX) 40 MG tablet Take 1 tablet (40 mg total) by mouth daily. 05/16/19  Yes Orpah Cobb, MD  potassium chloride SA (KLOR-CON) 10 MEQ tablet Take 1 tablet (10 mEq total) by mouth daily. 05/15/19  Yes Orpah Cobb, MD  budesonide-formoterol (SYMBICORT) 160-4.5 MCG/ACT inhaler Inhale 2 puffs into the lungs daily as needed (for flares).     [provider]  docusate sodium (COLACE) 100 MG  capsule Take 1 capsule (100 mg total) by mouth daily. Patient not taking: Reported on 07/11/2019 05/15/19   Orpah Cobb, MD  doxycycline (VIBRA-TABS) 100 MG tablet Take 1 tablet (100 mg total) by mouth 2 (two) times daily. Patient not taking: Reported on 07/11/2019 06/02/19   Rinaldo Cloud, MD  gabapentin (NEURONTIN) 300 MG capsule Take 2 capsules (600 mg total) by mouth 2 (two) times daily. Take 600 mg by mouth in the morning and 600 mg at bedtime Patient taking differently: Take 900 mg by mouth in the morning and at bedtime.  02/19/19   Levert Feinstein, MD  guaiFENesin-dextromethorphan (ROBITUSSIN DM) 100-10 MG/5ML syrup Take 15 mLs by mouth every 4 (four) hours as needed for cough. Patient not taking: Reported on 07/11/2019 05/15/19   Orpah Cobb, MD  HYDROcodone-acetaminophen (NORCO/VICODIN) 5-325 MG tablet Take 1 tablet by mouth every 4 (four) hours as needed for moderate pain. Patient not taking: Reported on 07/11/2019 05/15/19 05/14/20  Milinda Antis, PA-C  metoprolol tartrate (LOPRESSOR) 25 MG tablet Take 0.5 tablets (12.5 mg total) by mouth 2 (two) times daily. 06/02/19   Rinaldo Cloud, MD  nicotine (NICODERM CQ - DOSED IN MG/24 HOURS) 21 mg/24hr patch Place 1 patch (21 mg total) onto the skin daily. Patient not taking: Reported on 07/11/2019 05/16/19   Orpah Cobb, MD     Please refer to attending attestation for further details.

## 2019-07-12 NOTE — Progress Notes (Addendum)
eLink Physician-Brief Progress Note Patient Name: Brittany [name] DOB: [birth year] MRN: [id number]   Date of Service  07/12/2019  HPI/Events of Note  PT with inadequate pain control with a Q 4 hour PRN Morphine schedule.  eICU Interventions  PRN schedule changed to Morphine 1-2 mg Q 2 hours, will check CMET  and if no liver dysfunction, start Tylenol as well for multi-modal analgesia.        Thomasene Lot [name] 07/12/2019, 4:02 AM

## 2019-07-12 NOTE — Plan of Care (Signed)

## 2019-07-12 NOTE — Progress Notes (Signed)
eLink Physician-Brief Progress Note Patient Name: Brittany [name] DOB: [birth year] MRN: [id number]   Date of Service  07/12/2019  HPI/Events of Note  Pt needs order for a.m. labs  eICU Interventions   a.m. labs ordered.        Eluzer Howdeshell U Antwian Santaana 07/12/2019, 5:00 AM

## 2019-07-12 NOTE — ED Notes (Signed)
Attempted report 

## 2019-07-12 NOTE — Progress Notes (Signed)
eLink Physician-Brief Progress Note Patient Name: Brittany [name] DOB: Sep 18, 1955 MRN: [id number]   Date of Service  07/12/2019  HPI/Events of Note  Pt admitted yesterday with chest pain, she has a recent history of stenting of a thoracic aneurysm. Pt has pseudo-aneurysmal degeneration at both ends of the graft. She was admitted to the ICU for blood pressure control pending a decision regarding definitive surgery.  eICU Interventions  New Patient Evaluation completed.        Thomasene Lot Ogan 07/12/2019, 2:21 AM

## 2019-07-12 NOTE — Progress Notes (Signed)
  Progress Note    07/12/2019 8:47 AM * No surgery found *  Subjective:  Awake, alert complaining of upper anterior CP and mild chronic back pain. Denies abd pain   Vitals:   07/12/19 0823 07/12/19 0835  BP:    Pulse:    Resp:    Temp: 98.1 F (36.7 C)   SpO2:  95%    Physical Exam: Cardiac:  RRR Lungs:  CTA bil Incisions:  Previous abd incision healing nicely Extremities:  Normal AROM of both feet with palpable pulses Abdomen:  Soft , nd, + BS  CBC    Component Value Date/Time   WBC 10.6 (H) 07/12/2019 0234   RBC 3.30 (L) 07/12/2019 0234   HGB 8.1 (L) 07/12/2019 0234   HCT 27.4 (L) 07/12/2019 0234   PLT 473 (H) 07/12/2019 0234   MCV 83.0 07/12/2019 0234   MCH 24.5 (L) 07/12/2019 0234   MCHC 29.6 (L) 07/12/2019 0234   RDW 19.4 (H) 07/12/2019 0234   LYMPHSABS 1.5 05/28/2019 2143   MONOABS 1.3 (H) 05/28/2019 2143   EOSABS 0.1 05/28/2019 2143   BASOSABS 0.1 05/28/2019 2143    BMET    Component Value Date/Time   NA 136 07/12/2019 0234   K 4.9 07/12/2019 0234   CL 106 07/12/2019 0234   CO2 20 (L) 07/12/2019 0234   GLUCOSE 112 (H) 07/12/2019 0234   BUN 12 07/12/2019 0234   CREATININE 0.64 07/12/2019 0234   CREATININE 0.68 09/20/2017 1030   CALCIUM 8.6 (L) 07/12/2019 0234   GFRNONAA >60 07/12/2019 0234   GFRNONAA 96 09/20/2017 1030   GFRAA >60 07/12/2019 0234   GFRAA 111 09/20/2017 1030     Intake/Output Summary (Last 24 hours) at 07/12/2019 0847 Last data filed at 07/12/2019 0825 Gross per 24 hour  Intake 1290.98 ml  Output 400 ml  Net 890.98 ml    HOSPITAL MEDICATIONS Scheduled Meds: . atorvastatin  10 mg Oral q1800  . Chlorhexidine Gluconate Cloth  6 each Topical Daily  . citalopram  20 mg Oral Daily  . docusate sodium  100 mg Oral Daily  . insulin aspart  1-3 Units Subcutaneous Q4H  . ipratropium-albuterol  3 mL Inhalation BID  . nicotine  14 mg Transdermal Once  . pantoprazole (PROTONIX) IV  40 mg Intravenous QHS   Continuous  Infusions: . ceFEPime (MAXIPIME) IV 2 g (07/12/19 0423)  . esmolol 150 mcg/kg/min (07/12/19 0453)  . metronidazole 500 mg (07/12/19 0456)  . vancomycin Stopped (07/11/19 1516)   PRN Meds:.albuterol, docusate sodium, morphine injection, polyethylene glycol  Assessment:  64 y.o. female is s/p: TVAR 05/11/2019 now with Proximal and distal psuedoaneurysmal degeneration of descending thoracic aorta aneurysm.  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 CT surgery assessment noted * No surgery found *  Plan: -Braod spectrum abx and esmolo continue. As per CT surgery/CCM    Wendi Maya, PA-C [facility] [phone] 07/12/2019  8:47 AM

## 2019-07-13 ENCOUNTER — Inpatient Hospital Stay (HOSPITAL_COMMUNITY): Payer: Medicare HMO

## 2019-07-13 LAB — GLUCOSE, CAPILLARY
Glucose-Capillary: 117 mg/dL — ABNORMAL HIGH (ref 70–99)
Glucose-Capillary: 120 mg/dL — ABNORMAL HIGH (ref 70–99)
Glucose-Capillary: 134 mg/dL — ABNORMAL HIGH (ref 70–99)
Glucose-Capillary: 139 mg/dL — ABNORMAL HIGH (ref 70–99)
Glucose-Capillary: 148 mg/dL — ABNORMAL HIGH (ref 70–99)
Glucose-Capillary: 181 mg/dL — ABNORMAL HIGH (ref 70–99)

## 2019-07-13 MED ORDER — NOREPINEPHRINE 4 MG/250ML-% IV SOLN
0.0000 ug/min | INTRAVENOUS | Status: DC
  Filled 2019-07-13: qty 250

## 2019-07-13 MED ORDER — SODIUM CHLORIDE 0.9 % IV SOLN
1.5000 g | INTRAVENOUS | Status: AC
  Administered 2019-07-15: 09:00:00 1.5 g via INTRAVENOUS
  Filled 2019-07-13: qty 1.5

## 2019-07-13 MED ORDER — LABETALOL HCL 5 MG/ML IV SOLN
20.0000 mg | INTRAVENOUS | Status: DC | PRN
  Administered 2019-07-13: 20 mg via INTRAVENOUS
  Filled 2019-07-13: qty 4

## 2019-07-13 MED ORDER — VANCOMYCIN HCL IN DEXTROSE 1-5 GM/200ML-% IV SOLN
1000.0000 mg | INTRAVENOUS | Status: AC
  Administered 2019-07-15: 1000 mg via INTRAVENOUS
  Filled 2019-07-13: qty 200

## 2019-07-13 MED ORDER — MANNITOL 20 % IV SOLN
Freq: Once | INTRAVENOUS | Status: DC
  Filled 2019-07-13: qty 13

## 2019-07-13 MED ORDER — TRANEXAMIC ACID 1000 MG/10ML IV SOLN
1.5000 mg/kg/h | INTRAVENOUS | Status: AC
  Administered 2019-07-15: 1.5 mg/kg/h via INTRAVENOUS
  Filled 2019-07-13: qty 25

## 2019-07-13 MED ORDER — PHENYLEPHRINE HCL-NACL 20-0.9 MG/250ML-% IV SOLN
30.0000 ug/min | INTRAVENOUS | Status: AC
  Administered 2019-07-15: 25 ug/min via INTRAVENOUS
  Filled 2019-07-13: qty 250

## 2019-07-13 MED ORDER — SODIUM CHLORIDE 0.9 % IV SOLN
INTRAVENOUS | Status: DC
  Filled 2019-07-13: qty 30

## 2019-07-13 MED ORDER — SODIUM CHLORIDE 0.9 % IV SOLN
750.0000 mg | INTRAVENOUS | Status: AC
  Administered 2019-07-15: 14:00:00 750 mg via INTRAVENOUS
  Filled 2019-07-13: qty 750

## 2019-07-13 MED ORDER — MILRINONE LACTATE IN DEXTROSE 20-5 MG/100ML-% IV SOLN
0.3000 ug/kg/min | INTRAVENOUS | Status: DC
  Filled 2019-07-13: qty 100

## 2019-07-13 MED ORDER — PLASMA-LYTE 148 IV SOLN
INTRAVENOUS | Status: DC
  Filled 2019-07-13: qty 2.5

## 2019-07-13 MED ORDER — INSULIN REGULAR(HUMAN) IN NACL 100-0.9 UT/100ML-% IV SOLN
INTRAVENOUS | Status: AC
  Administered 2019-07-15: 1.4 [IU]/h via INTRAVENOUS
  Filled 2019-07-13: qty 100

## 2019-07-13 MED ORDER — GABAPENTIN 600 MG PO TABS
600.0000 mg | ORAL_TABLET | Freq: Two times a day (BID) | ORAL | Status: DC
  Administered 2019-07-13 – 2019-07-16 (×5): 600 mg via ORAL
  Filled 2019-07-13 (×5): qty 1

## 2019-07-13 MED ORDER — TRANEXAMIC ACID (OHS) PUMP PRIME SOLUTION
2.0000 mg/kg | INTRAVENOUS | Status: DC
  Filled 2019-07-13: qty 1.08

## 2019-07-13 MED ORDER — NITROGLYCERIN IN D5W 200-5 MCG/ML-% IV SOLN
2.0000 ug/min | INTRAVENOUS | Status: DC
  Filled 2019-07-13: qty 250

## 2019-07-13 MED ORDER — POTASSIUM CHLORIDE 2 MEQ/ML IV SOLN
80.0000 meq | INTRAVENOUS | Status: DC
  Filled 2019-07-13: qty 40

## 2019-07-13 MED ORDER — EPINEPHRINE HCL 5 MG/250ML IV SOLN IN NS
0.0000 ug/min | INTRAVENOUS | Status: DC
  Filled 2019-07-13: qty 250

## 2019-07-13 MED ORDER — TRANEXAMIC ACID (OHS) BOLUS VIA INFUSION
15.0000 mg/kg | INTRAVENOUS | Status: AC
  Administered 2019-07-15: 808.5 mg via INTRAVENOUS
  Filled 2019-07-13: qty 809

## 2019-07-13 MED ORDER — DEXMEDETOMIDINE HCL IN NACL 400 MCG/100ML IV SOLN
0.1000 ug/kg/h | INTRAVENOUS | Status: DC
  Filled 2019-07-13: qty 100

## 2019-07-13 NOTE — Progress Notes (Signed)
    Subjective  -   Still with chest pain this morning however it is improved from yesterday.   Physical Exam:  Both extremities are warm and well perfused Abdomen is soft and nontender. Neurologically intact.       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  Possibilities include a mycotic process or degenerative progression secondary to hypertensive crisis.  I discussed with the patient that our plan would be exploration via sternotomy in the operating room on Monday.  If this appears to be a mycotic process she would require attempted explant of her stent grafts and replacement of her aortic arch and great vessels with homograft.  She would then require a staged repair for the distal pseudoaneurysm with additional homograft.  If this does not appear to be an infectious process, she would still require sternotomy with arch reconstruction and replacement however the distal pseudoaneurysm could be treated with a stent graft.  Wells Brabham 07/13/2019 8:17 AM --  Vitals:   07/13/19 0500 07/13/19 0751  BP: 129/70   Pulse:    Resp: 16   Temp:  98 F (36.7 C)  SpO2: 97%     Intake/Output Summary (Last 24 hours) at 07/13/2019 0817 Last data filed at 07/13/2019 0459 Gross per 24 hour  Intake 781.41 ml  Output 200 ml  Net 581.41 ml     Laboratory CBC    Component Value Date/Time   WBC 10.6 (H) 07/12/2019 0234   HGB 8.1 (L) 07/12/2019 0234   HCT 27.4 (L) 07/12/2019 0234   PLT 473 (H) 07/12/2019 0234    BMET    Component Value Date/Time   NA 136 07/12/2019 0234   K 4.9 07/12/2019 0234   CL 106 07/12/2019 0234   CO2 20 (L) 07/12/2019 0234   GLUCOSE 112 (H) 07/12/2019 0234   BUN 12 07/12/2019 0234   CREATININE 0.64 07/12/2019 0234   CREATININE 0.68 09/20/2017 1030   CALCIUM 8.6 (L) 07/12/2019 0234   GFRNONAA >60 07/12/2019 0234   GFRNONAA 96 09/20/2017 1030   GFRAA >60  07/12/2019 0234   GFRAA 111 09/20/2017 1030    COAG Lab Results  Component Value Date   INR 1.1 07/11/2019   INR 1.3 (H) 05/29/2019   INR 1.3 (H) 05/11/2019   No results found for: PTT  Antibiotics Anti-infectives (From admission, onward)   Start     Dose/Rate Route Frequency Ordered Stop   07/11/19 1300  vancomycin (VANCOREADY) IVPB 1250 mg/250 mL     1,250 mg 166.7 mL/hr over 90 Minutes Intravenous Every 24 hours 07/11/19 1233     07/11/19 1300  ceFEPIme (MAXIPIME) 2 g in sodium chloride 0.9 % 100 mL IVPB     2 g 200 mL/hr over 30 Minutes Intravenous Every 8 hours 07/11/19 1233     07/11/19 1300  metroNIDAZOLE (FLAGYL) IVPB 500 mg  Status:  Discontinued     500 mg 100 mL/hr over 60 Minutes Intravenous Every 8 hours 07/11/19 1233 07/12/19 0932       V. Charlena Cross, M.D., [facility] [place] Office: [phone] Pager:  [phone]

## 2019-07-13 NOTE — Progress Notes (Signed)
[facility] PCCM  System wide shortage of ICU beds. Stable off IV drips Will move to the floor, TRH service  Heber [place], MD [place] P[facility] Pager: [phone] Cell: [phone] If no response, call [phone]

## 2019-07-13 NOTE — Progress Notes (Signed)
Patient started back on esmolol drip at 1740. Dr. Kendrick Fries notified and gave orders for labetalol push to avoid use of esmolol in order to facilitate patient transfer.

## 2019-07-13 NOTE — Progress Notes (Addendum)
NAME:  Brittany [name], MRN:  [id number], DOB:  09-26-1955, LOS: 2 ADMISSION DATE:  07/11/2019, CONSULTATION DATE:  07/11/2019 REFERRING MD:  Theda Belfast, CHIEF COMPLAINT:  Chest pain.   Brief History   Brittany [name] is a 64 yo F with Hx of Thoracic Aortic Aneurysm (s/p repair/stenting Feb 2021), HTN, Substance Use, CVA, GERD, COPD, Anxiety/Depresion, Hep C, and Neuropathy who presents with 3 days of worsening chest pain. Reports cocaine use once last week. CTA in ED showed pseudoaneurysms at distal and proximal ends of graft. Vascular surgery was consulted who will discuss case with CT surgery. 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 PCCM asked to admit to ICU on infusion for BP control.  History of present illness   Brittany [name] is a 64 yo F with Hx of Thoracic Aortic Aneurysm (s/p repair/stenting Feb 2021), HTN, Substance Use, CVA, GERD, COPD, Anxiety/Depresion, Hep C, and Neuropathy who presents with 3 days of worsening chest pain. Pain began 3 days ago when pushing her garbage can to the curb. It has been constant since that time and became severe 910/10) one day ago. The pain is in her central chest and, at times, radiates to her back. She reports 5 days of dark stools, nausea and subjective fever about 2 weeks ago. Denies current fevers, chills, SOB, N/V, Diarrhea, H/A, vision changes.  In ED CTA was performed, found to have pseudoaneurysms at distal and proximal ends of graft. Vascular surgery was consulted who will discuss case with CT surgery. PCCM asked to admit to ICU on infusion for BP control.  Past Medical History  Thoracic Aortic Aneurysm (s/p repair/stenting Feb 2021) HTN Substance Use - opiates, cocaine, THC  CVA GERD COPD Anxiety/Depresion Hep C (cleared per chart) Neuropathy  Significant Hospital Events   4/22- Admit  Consults:  VSS, TCTS   Procedures:  None  Significant Diagnostic Tests:  4/22 CTA C/A/P: IMPRESSION: 1. New  pseudoaneurysms or contained ruptures just above and just below the thoracic aortic stent graft. No mediastinal hematoma. 2. Stable small pseudoaneurysms or small ulcerated plaques in the distal abdominal aorta. No dissection. Abdominal aortic branch vessels are patent. 3. Stable emphysematous changes and pulmonary scarring. 4. No acute pulmonary findings. 5. No acute abdominal/pelvic findings, mass lesions or lymphadenopathy. 6. Emphysema and aortic atherosclerosis.  Micro Data:  4/22 RVP negative  4/22 Blood Cultures: NGTD  Antimicrobials:  Vancomycin 4/22 > Cefepime 4/22 > Flagyl 4/22 X1  Interim history/subjective:  Pt reports good pain control overnight. No complaints this morning. Is in high spirits.  Objective   Blood pressure 112/65, pulse 94, temperature 98 F (36.7 C), resp. rate 17, height 5\' 2"  (1.575 m), weight 53.9 kg, SpO2 97 %.        Intake/Output Summary (Last 24 hours) at 07/13/2019 1046 Last data filed at 07/13/2019 1000 Gross per 24 hour  Intake 1174.17 ml  Output --  Net 1174.17 ml   Filed Weights   07/11/19 0152 07/13/19 0458  Weight: 55 kg 53.9 kg   Examination General: chronically ill appearing female in NAD HENT: [place] Lungs: lungs clear. Breathing comfortably on [place] Cardiovascular: RRR. 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 #Substance abuse with cocaine #  HTN  Vascular surgery on board and planning to take to OR on Monday for repair. Possible concern for infection however blood cultures remain without growth and she remains afebrile. Esmolol drip d/c 4/23 Blood pressures stable overnight Plan: --  metoprolol 12.5 mg BID and losartan 50 mg QD  --  Follow blood cultures -- Continue broad spectrum antibiotics with vancomycin and cefepime -- Continue  Morphine 2-4mg  q2h for pain control    COPD: continue duonebs Anxiety and Depression: Continue home Celexa Hx CVA: Continue home atorvastatin, holding ASA  Neuropathy: Managing pain with opiates as above  GERD: Switched IV protonix to PO   Best practice:  Diet: cardiac Pain/Anxiety/Delirium protocol (if indicated): Morphine PRN VAP protocol (if indicated): N/A DVT prophylaxis: SCDs GI prophylaxis: PO PPI Glucose control: SSI Mobility: BR Code Status: Full Family Communication: Discussed with patient Disposition: ICU  Labs   CBC: Recent Labs  Lab 07/11/19 0234 07/12/19 0234  WBC 12.1* 10.6*  HGB 8.4* 8.1*  HCT 29.2* 27.4*  MCV 84.6 83.0  PLT 476* 473*    Basic Metabolic Panel: Recent Labs  Lab 07/11/19 0234 07/12/19 0234  NA 135 136  K 4.6 4.9  CL 102 106  CO2 21* 20*  GLUCOSE 194* 112*  BUN 11 12  CREATININE 0.54 0.64  CALCIUM 8.5* 8.6*  MG  --  1.9  PHOS  --  4.5   GFR: Estimated Creatinine Clearance: 58.4 mL/min (by C-G formula based on SCr of 0.64 mg/dL). Recent Labs  Lab 07/11/19 0234 07/12/19 0234  WBC 12.1* 10.6*    Liver Function Tests: No results for input(s): AST, ALT, ALKPHOS, BILITOT, PROT, ALBUMIN in the last 168 hours. No results for input(s): LIPASE, AMYLASE in the last 168 hours. No results for input(s): AMMONIA in the last 168 hours.  ABG    Component Value Date/Time   HCO3 22.6 01/05/2019 1509   TCO2 27 05/28/2019 2202   ACIDBASEDEF 6.0 (H) 01/05/2019 1509   O2SAT 87.0 01/05/2019 1509     Coagulation Profile: Recent Labs  Lab 07/11/19 0234  INR 1.1    Cardiac Enzymes: No results for input(s): CKTOTAL, CKMB, CKMBINDEX, TROPONINI in the last 168 hours.  HbA1C: Hgb A1c MFr Bld  Date/Time Value Ref Range Status  05/29/2019 04:40 AM 6.7 (H) 4.8 - 5.6 % Final    Comment:    (NOTE) Pre diabetes:           5.7%-6.4% Diabetes:              >6.4% Glycemic control for   <7.0% adults with diabetes   01/06/2019 06:28 AM 5.8 (H) 4.8 - 5.6 % Final    Comment:    (NOTE)         Prediabetes: 5.7 - 6.4         Diabetes: >6.4         Glycemic control for adults with diabetes: <7.0     CBG: Recent Labs  Lab 07/12/19 1530 07/12/19 1959 07/12/19 2352 07/13/19 0353 07/13/19 [place], MD Internal Medicine Resident PGY1 Pager [phone] 07/13/19  10:53 AM

## 2019-07-14 ENCOUNTER — Inpatient Hospital Stay (HOSPITAL_COMMUNITY): Payer: Medicare HMO

## 2019-07-14 ENCOUNTER — Inpatient Hospital Stay: Payer: Self-pay

## 2019-07-14 DIAGNOSIS — Z01810 Encounter for preprocedural cardiovascular examination: Secondary | ICD-10-CM

## 2019-07-14 LAB — BASIC METABOLIC PANEL
Anion gap: 9 (ref 5–15)
BUN: 11 mg/dL (ref 8–23)
CO2: 23 mmol/L (ref 22–32)
Calcium: 8.4 mg/dL — ABNORMAL LOW (ref 8.9–10.3)
Chloride: 104 mmol/L (ref 98–111)
Creatinine, Ser: 0.65 mg/dL (ref 0.44–1.00)
GFR calc Af Amer: >60 mL/min (ref >60)
GFR calc non Af Amer: >60 mL/min (ref >60)
Glucose, Bld: 147 mg/dL — ABNORMAL HIGH (ref 70–99)
Potassium: 3.7 mmol/L (ref 3.5–5.1)
Sodium: 136 mmol/L (ref 135–145)

## 2019-07-14 LAB — GLUCOSE, CAPILLARY
Glucose-Capillary: 133 mg/dL — ABNORMAL HIGH (ref 70–99)
Glucose-Capillary: 84 mg/dL (ref 70–99)
Glucose-Capillary: 110 mg/dL — ABNORMAL HIGH (ref 70–99)
Glucose-Capillary: 114 mg/dL — ABNORMAL HIGH (ref 70–99)
Glucose-Capillary: 116 mg/dL — ABNORMAL HIGH (ref 70–99)

## 2019-07-14 LAB — CBC
HCT: 27.3 % — ABNORMAL LOW (ref 36.0–46.0)
Hemoglobin: 8.2 g/dL — ABNORMAL LOW (ref 12.0–15.0)
MCH: 24.8 pg — ABNORMAL LOW (ref 26.0–34.0)
MCHC: 30 g/dL (ref 30.0–36.0)
MCV: 82.5 fL (ref 80.0–100.0)
Platelets: 461 10*3/uL — ABNORMAL HIGH (ref 150–400)
RBC: 3.31 MIL/uL — ABNORMAL LOW (ref 3.87–5.11)
RDW: 19.2 % — ABNORMAL HIGH (ref 11.5–15.5)
WBC: 7 10*3/uL (ref 4.0–10.5)
nRBC: 0 % (ref 0.0–0.2)

## 2019-07-14 LAB — MAGNESIUM: Magnesium: 1.8 mg/dL (ref 1.7–2.4)

## 2019-07-14 LAB — PHOSPHORUS: Phosphorus: 3.1 mg/dL (ref 2.5–4.6)

## 2019-07-14 LAB — MRSA PCR SCREENING: MRSA by PCR: NEGATIVE

## 2019-07-14 MED ORDER — LORAZEPAM 1 MG PO TABS
2.0000 mg | ORAL_TABLET | Freq: Once | ORAL | Status: AC
  Administered 2019-07-14: 2 mg via ORAL
  Filled 2019-07-14: qty 2

## 2019-07-14 MED ORDER — ACETAMINOPHEN 500 MG PO TABS: 1000.0000 mg | ORAL_TABLET | Freq: Once | ORAL | Status: DC

## 2019-07-14 MED ORDER — CHLORHEXIDINE GLUCONATE CLOTH 2 % EX PADS
6.0000 | MEDICATED_PAD | Freq: Once | CUTANEOUS | Status: AC
  Administered 2019-07-14: 6 via TOPICAL

## 2019-07-14 MED ORDER — CHLORHEXIDINE GLUCONATE 0.12 % MT SOLN
15.0000 mL | Freq: Once | OROMUCOSAL | Status: AC
  Administered 2019-07-15: 15 mL via OROMUCOSAL
  Filled 2019-07-14: qty 15

## 2019-07-14 MED ORDER — BISACODYL 5 MG PO TBEC
5.0000 mg | DELAYED_RELEASE_TABLET | Freq: Once | ORAL | Status: AC
  Administered 2019-07-14: 5 mg via ORAL
  Filled 2019-07-14: qty 1

## 2019-07-14 MED ORDER — TEMAZEPAM 15 MG PO CAPS
15.0000 mg | ORAL_CAPSULE | Freq: Once | ORAL | Status: AC | PRN
  Administered 2019-07-14: 15 mg via ORAL
  Filled 2019-07-14: qty 1

## 2019-07-14 MED ORDER — IOHEXOL 350 MG/ML SOLN
80.0000 mL | Freq: Once | INTRAVENOUS | Status: AC | PRN
  Administered 2019-07-14: 80 mL via INTRAVENOUS

## 2019-07-14 MED ORDER — CHLORHEXIDINE GLUCONATE CLOTH 2 % EX PADS: 6.0000 | MEDICATED_PAD | Freq: Once | CUTANEOUS | Status: DC

## 2019-07-14 MED ORDER — SODIUM CHLORIDE 0.9% FLUSH: 10.0000 mL | INTRAVENOUS | Status: DC | PRN

## 2019-07-14 MED ORDER — LORAZEPAM 1 MG PO TABS: 1.0000 mg | ORAL_TABLET | Freq: Once | ORAL | Status: DC

## 2019-07-14 MED ORDER — SODIUM CHLORIDE 0.9% FLUSH
10.0000 mL | Freq: Two times a day (BID) | INTRAVENOUS | Status: DC
  Administered 2019-07-14: 10 mL

## 2019-07-14 MED ORDER — METOPROLOL TARTRATE 12.5 MG HALF TABLET
12.5000 mg | ORAL_TABLET | Freq: Once | ORAL | Status: AC
  Administered 2019-07-15: 12.5 mg via ORAL
  Filled 2019-07-14: qty 1

## 2019-07-14 NOTE — Progress Notes (Signed)
PROGRESS NOTE  Brittany [name] FXT:[id number] DOB: [birth year] DOA: 07/11/2019 PCP: Sandrea Hughs, NP  HPI/Recap of past 2 hours:  64 year old woman with a history of thoracic aortic aneurysm that was repaired/stented in February 2021.  She also has a history of polysubstance use, hypertension, COPD, hepatitis C.  She was admitted with 3 days of progressive midsternal chest pain.  UDS positive for cocaine and opiates.  A CT of the chest showed pseudoaneurysmal formation deformity at the distal and proximal ends of her aortic graft with concern for possible mycotic aneurysm.  She was noted to be severely hypertensive 231/110.  Esmolol infusion was initiated and she was admitted to the ICU for further care.  Broad-spectrum antibiotics were started given the concern for possible infection mediating her aortic abnormalities.  Transferred to [facility] service on 07/14/19.  07/14/19: Seen and examined.  Reports persistent chest pain with no improving factors.  Chest pain is currently 8 out of 10 and constant despite IV pain medications.   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 Continue IV antibiotics empirically Blood cultures x 2 negative to date, continue to follow cultures Currently on broad-spectrum IV antibiotic IV cefepime and IV vancomycin Obtain MRSA screen Maintain BP normotensive NPO after midnight for possible aortic arch reconstruction with homograft and possible stent graft placement in her descending thoracic aorta tomorrow Care directed by vascular surgery  Uncontrolled hypertension Maintain BP normotensive Off esmolol drip since yesterday Currently on metoprolol 12.5 mg twice daily and losartan 50 mg daily Closely monitor BP and treat as indicated to maintain  normotensive  Type 2 diabetes with hyperglycemia Hemoglobin A1c 6.7 Continue to hold off oral hypoglycemics Continue insulin sliding scale  Chronic normocytic anemia Hemoglobin 8.2 No overt bleeding Continue to monitor H&H  Polysubstance abuse UDS positive for cocaine and opiates Cessation counseling at bedside  Chronic anxiety/depression Continue citalopram  Hyperlipidemia Continue statin  GERD Continue Protonix  Tobacco use disorder Continue nicotine patch  COPD O2 saturation 99% on room air DuoNeb as needed    DVT prophylaxis: SCDs GI prophylaxis: PO PPI  Code Status: Full  Family Communication: We will call family if okay with the patient. Disposition: Patient is from home.  Anticipate discharge to rehab post surgery.  Barrier to discharge:  Surgical intervention tomorrow.  Objective: Vitals:   07/14/19 0455 07/14/19 0500 07/14/19 0640 07/14/19 0740  BP:  (!) 113/59 137/80 (!) 143/67  Pulse:   84   Resp:  16 20   Temp: 98.2 F (36.8 C)  98.2 F (36.8 C)   TempSrc: Oral  Oral   SpO2:  98% 99%   Weight:   44.9 kg   Height:        Intake/Output Summary (Last 24 hours) at 07/14/2019 0858 Last data filed at 07/14/2019 0744 Gross per 24 hour  Intake 2387.77 ml  Output 600 ml  Net 1787.77 ml   Filed Weights   07/11/19 0152 07/13/19 0458 07/14/19 0640  Weight: 55 kg 53.9 kg 44.9 kg    Exam:  . General: 64 y.o. year-old female well developed well nourished in no acute distress.  Alert and oriented x3. . Cardiovascular: Regular rate and rhythm with no rubs or gallops.  No thyromegaly or JVD noted.   Marland Kitchen Respiratory: Clear to auscultation with no wheezes or  rales. Good inspiratory effort. . Abdomen: Soft nontender nondistended with normal bowel sounds x4 quadrants. . Musculoskeletal: No lower extremity edema. 2/4 pulses in all 4 extremities. Marland Kitchen Psychiatry: Mood is appropriate for condition and setting   Data Reviewed: CBC: Recent Labs  Lab  07/11/19 0234 07/12/19 0234 07/14/19 0330  WBC 12.1* 10.6* 7.0  HGB 8.4* 8.1* 8.2*  HCT 29.2* 27.4* 27.3*  MCV 84.6 83.0 82.5  PLT 476* 473* 461*   Basic Metabolic Panel: Recent Labs  Lab 07/11/19 0234 07/12/19 0234 07/14/19 0330  NA 135 136 136  K 4.6 4.9 3.7  CL 102 106 104  CO2 21* 20* 23  GLUCOSE 194* 112* 147*  BUN 11 12 11   CREATININE 0.54 0.64 0.65  CALCIUM 8.5* 8.6* 8.4*  MG  --  1.9  --   PHOS  --  4.5  --    GFR: Estimated Creatinine Clearance: 52.3 mL/min (by C-G formula based on SCr of 0.65 mg/dL). Liver Function Tests: No results for input(s): AST, ALT, ALKPHOS, BILITOT, PROT, ALBUMIN in the last 168 hours. No results for input(s): LIPASE, AMYLASE in the last 168 hours. No results for input(s): AMMONIA in the last 168 hours. Coagulation Profile: Recent Labs  Lab 07/11/19 0234  INR 1.1   Cardiac Enzymes: No results for input(s): CKTOTAL, CKMB, CKMBINDEX, TROPONINI in the last 168 hours. BNP (last 3 results) No results for input(s): PROBNP in the last 8760 hours. HbA1C: No results for input(s): HGBA1C in the last 72 hours. CBG: Recent Labs  Lab 07/13/19 1555 07/13/19 2014 07/13/19 2335 07/14/19 0453 07/14/19 0650  GLUCAP 120* 139* 148* 133* 84   Lipid Profile: No results for input(s): CHOL, HDL, LDLCALC, TRIG, CHOLHDL, LDLDIRECT in the last 72 hours. Thyroid Function Tests: No results for input(s): TSH, T4TOTAL, FREET4, T3FREE, THYROIDAB in the last 72 hours. Anemia Panel: No results for input(s): VITAMINB12, FOLATE, FERRITIN, TIBC, IRON, RETICCTPCT in the last 72 hours. Urine analysis:    Component Value Date/Time   COLORURINE YELLOW 01/05/2019 1607   APPEARANCEUR HAZY (A) 01/05/2019 1607   LABSPEC 1.017 01/05/2019 1607   PHURINE 5.0 01/05/2019 1607   GLUCOSEU NEGATIVE 01/05/2019 1607   HGBUR SMALL (A) 01/05/2019 1607   BILIRUBINUR NEGATIVE 01/05/2019 1607   KETONESUR 5 (A) 01/05/2019 1607   PROTEINUR 30 (A) 01/05/2019 1607    UROBILINOGEN 0.2 07/11/2013 1933   NITRITE NEGATIVE 01/05/2019 1607   LEUKOCYTESUR NEGATIVE 01/05/2019 1607   Sepsis Labs: @LABRCNTIP (procalcitonin:4,lacticidven:4)  ) Recent Results (from the past 240 hour(s))  Respiratory Panel by RT PCR (Flu A&B, Covid) - Nasopharyngeal Swab     Status: None   Collection Time: 07/11/19  6:56 AM   Specimen: Nasopharyngeal Swab  Result Value Ref Range Status   SARS Coronavirus 2 by RT PCR NEGATIVE NEGATIVE Final    Comment: (NOTE) SARS-CoV-2 target nucleic acids are NOT DETECTED. The SARS-CoV-2 RNA is generally detectable in upper respiratoy specimens during the acute phase of infection. The lowest concentration of SARS-CoV-2 viral copies this assay can detect is 131 copies/mL. A negative result does not preclude SARS-Cov-2 infection and should not be used as the sole basis for treatment or other patient management decisions. A negative result may occur with  improper specimen collection/handling, submission of specimen other than nasopharyngeal swab, presence of viral mutation(s) within the areas targeted by this assay, and inadequate number of viral copies (<131 copies/mL). A negative result must be combined with clinical observations, patient history, and epidemiological information. The expected result  is Negative. Fact Sheet for Patients:  https://www.moore.com/ Fact Sheet for Healthcare Providers:  https://www.young.biz/ This test is not yet ap proved or cleared by the [place] FDA and  has been authorized for detection and/or diagnosis of SARS-CoV-2 by FDA under an Emergency Use Authorization (EUA). This EUA will remain  in effect (meaning this test can be used) for the duration of the COVID-19 declaration under Section 564(b)(1) of the Act, 21 U.S.C. section 360bbb-3(b)(1), unless the authorization is terminated or revoked sooner.    Influenza A by PCR NEGATIVE NEGATIVE Final   Influenza B  by PCR NEGATIVE NEGATIVE Final    Comment: (NOTE) The Xpert Xpress SARS-CoV-2/FLU/RSV assay is intended as an aid in  the diagnosis of influenza from Nasopharyngeal swab specimens and  should not be used as a sole basis for treatment. Nasal washings and  aspirates are unacceptable for Xpert Xpress SARS-CoV-2/FLU/RSV  testing. Fact Sheet for Patients: https://www.moore.com/ Fact Sheet for Healthcare Providers: https://www.young.biz/ This test is not yet approved or cleared by the [place] FDA and  has been authorized for detection and/or diagnosis of SARS-CoV-2 by  FDA under an Emergency Use Authorization (EUA). This EUA will remain  in effect (meaning this test can be used) for the duration of the  Covid-19 declaration under Section 564(b)(1) of the Act, 21  U.S.C. section 360bbb-3(b)(1), unless the authorization is  terminated or revoked. Performed at [facility], [address], [place], [place] [address]   Blood culture (routine x 2)     Status: None (Preliminary result)   Collection Time: 07/11/19 11:43 AM   Specimen: BLOOD  Result Value Ref Range Status   Specimen Description BLOOD LEFT ANTECUBITAL  Final   Special Requests   Final    BOTTLES DRAWN AEROBIC AND ANAEROBIC Blood Culture adequate volume   Culture   Final    NO GROWTH 2 DAYS Performed at [facility], [address] [place], [place] [address]    Report Status PENDING  Incomplete  Blood culture (routine x 2)     Status: None (Preliminary result)   Collection Time: 07/11/19 11:49 AM   Specimen: BLOOD RIGHT ARM  Result Value Ref Range Status   Specimen Description BLOOD RIGHT ARM  Final   Special Requests   Final    BOTTLES DRAWN AEROBIC AND ANAEROBIC Blood Culture adequate volume   Culture   Final    NO GROWTH 2 DAYS Performed at [facility], [address], [place], [place] [address]    Report Status PENDING  Incomplete      Studies: No  results found.  Scheduled Meds: . atorvastatin  10 mg Oral q1800  . Chlorhexidine Gluconate Cloth  6 each Topical Daily  . citalopram  20 mg Oral Daily  . docusate sodium  100 mg Oral Daily  . [START ON 07/15/2019] epinephrine  0-10 mcg/min Intravenous To OR  . gabapentin  600 mg Oral BID  . [START ON 07/15/2019] heparin-papaverine-plasmalyte irrigation   Irrigation To OR  . insulin aspart  1-3 Units Subcutaneous Q4H  . [START ON 07/15/2019] insulin   Intravenous To OR  . [START ON 07/15/2019] [place] Blood Cardioplegia vial (lidocaine/magnesium/mannitol 0.26g-4g-6.4g)   Intracoronary Once  . losartan  50 mg Oral Daily  . metoprolol tartrate  12.5 mg Oral BID  . nicotine  14 mg Transdermal Daily  . pantoprazole  40 mg Oral Daily  . [START ON 07/15/2019] phenylephrine  30-200 mcg/min Intravenous To  OR  . [START ON 07/15/2019] potassium chloride  80 mEq Other To OR  . [START ON 07/15/2019] tranexamic acid  15 mg/kg Intravenous To OR  . [START ON 07/15/2019] tranexamic acid  2 mg/kg Intracatheter To OR    Continuous Infusions: . sodium chloride 10 mL/hr at 07/14/19 0400  . ceFEPime (MAXIPIME) IV 2 g (07/14/19 0532)  . [START ON 07/15/2019] cefUROXime (ZINACEF)  IV    . [START ON 07/15/2019] cefUROXime (ZINACEF)  IV    . [START ON 07/15/2019] dexmedetomidine    . esmolol Stopped (07/13/19 1841)  . [START ON 07/15/2019] heparin 30,000 units/NS 1000 mL solution for CELLSAVER    . [START ON 07/15/2019] milrinone    . [START ON 07/15/2019] nitroGLYCERIN    . [START ON 07/15/2019] norepinephrine    . [START ON 07/15/2019] tranexamic acid (CYKLOKAPRON) infusion ([facility])    . [START ON 07/15/2019] vancomycin    . vancomycin Stopped (07/13/19 1451)     LOS: 3 days     Darlin Drop, MD [facility] Pager [phone]  If 7PM-7AM, please contact night-coverage [email or web address] Password [facility] 07/14/2019, 8:58 AM

## 2019-07-14 NOTE — Progress Notes (Signed)
    Subjective  -  Transferred to the floor earlier today. Still with mild chest pain.   Physical Exam:  Extremities warm and well-perfused. Nonlabored breathing Abdomen is soft and nontender       Assessment/Plan:    Aortic pseudoaneurysm: I discussed with the patient that we would be available tomorrow should she require additional stent graft placement in her descending thoracic aorta.  Dr. Renaldo Fiddler has discussed aortic arch reconstruction with homograft.  She will be n.p.o. after midnight.  Brittany [name] 07/14/2019 10:31 AM --  Vitals:   07/14/19 0640 07/14/19 0740  BP: 137/80 (!) 143/67  Pulse: 84   Resp: 20   Temp: 98.2 F (36.8 C)   SpO2: 99%     Intake/Output Summary (Last 24 hours) at 07/14/2019 1031 Last data filed at 07/14/2019 0744 Gross per 24 hour  Intake 2247.79 ml  Output 600 ml  Net 1647.79 ml     Laboratory CBC    Component Value Date/Time   WBC 7.0 07/14/2019 0330   HGB 8.2 (L) 07/14/2019 0330   HCT 27.3 (L) 07/14/2019 0330   PLT 461 (H) 07/14/2019 0330    BMET    Component Value Date/Time   NA 136 07/14/2019 0330   K 3.7 07/14/2019 0330   CL 104 07/14/2019 0330   CO2 23 07/14/2019 0330   GLUCOSE 147 (H) 07/14/2019 0330   BUN 11 07/14/2019 0330   CREATININE 0.65 07/14/2019 0330   CREATININE 0.68 09/20/2017 1030   CALCIUM 8.4 (L) 07/14/2019 0330   GFRNONAA >60 07/14/2019 0330   GFRNONAA 96 09/20/2017 1030   GFRAA >60 07/14/2019 0330   GFRAA 111 09/20/2017 1030    COAG Lab Results  Component Value Date   INR 1.1 07/11/2019   INR 1.3 (H) 05/29/2019   INR 1.3 (H) 05/11/2019   No results found for: PTT  Antibiotics Anti-infectives (From admission, onward)   Start     Dose/Rate Route Frequency Ordered Stop   07/15/19 0400  vancomycin (VANCOCIN) IVPB 1000 mg/200 mL premix     1,000 mg 200 mL/hr over 60 Minutes Intravenous To Surgery 07/13/19 1127 07/16/19 0400   07/15/19 0400  cefUROXime (ZINACEF) 1.5 g in sodium  chloride 0.9 % 100 mL IVPB     1.5 g 200 mL/hr over 30 Minutes Intravenous To Surgery 07/13/19 1127 07/16/19 0400   07/15/19 0400  cefUROXime (ZINACEF) 750 mg in sodium chloride 0.9 % 100 mL IVPB     750 mg 200 mL/hr over 30 Minutes Intravenous To Surgery 07/13/19 1127 07/16/19 0400   07/11/19 1300  vancomycin (VANCOREADY) IVPB 1250 mg/250 mL     1,250 mg 166.7 mL/hr over 90 Minutes Intravenous Every 24 hours 07/11/19 1233     07/11/19 1300  ceFEPIme (MAXIPIME) 2 g in sodium chloride 0.9 % 100 mL IVPB     2 g 200 mL/hr over 30 Minutes Intravenous Every 8 hours 07/11/19 1233     07/11/19 1300  metroNIDAZOLE (FLAGYL) IVPB 500 mg  Status:  Discontinued     500 mg 100 mL/hr over 60 Minutes Intravenous Every 8 hours 07/11/19 1233 07/12/19 0932       V. Charlena Cross, M.D., [facility] [place] Office: [phone] Pager:  [phone]

## 2019-07-14 NOTE — Progress Notes (Signed)
Multiple calls made by this RN re PICC Order.  Have spoken to Marylene Land, 2H CN, Dr Myra Gianotti and Dr Margo Aye re PICC line order.  Due to surgery scheduled for 4/26 am, was requesting clarification re lumens preferred for PICC placement.  Per Dr Myra Gianotti,  Surgery will place CVC etc for needs and does not want to have a PICC in the SVC in OR.  Dr Margo Aye notified and recommended to attempt midline placement at this time for current needs.  Also aware that Vanc is only approved for midlines up to 6 days max.  Dr Margo Aye to cancel PICC order and place midline order as per conversation.  Rosanne Ashing RN also notified of conversations.  Rosanne Ashing states pt says she may go AMA if this IV access is unsuccessful.

## 2019-07-14 NOTE — Progress Notes (Signed)
Patient extremely anxious.  Complained of pain when peripheral IV was used in left forearm.  Site removed, IV team placed new site in right forearm. Patient sent to CT but procedure aborted when she  Complained of pain to new IV site.  Stated she wanted to leave AMA. Dr. Margo Aye called and came to see patient, who is willing to stay for one more IV placement attempt.  Currently waiting for IV team to place midline.  IV antibiotics still pending, CT angio still pending.

## 2019-07-14 NOTE — Anesthesia Preprocedure Evaluation (Addendum)
Anesthesia Evaluation  Patient identified by MRN, date of birth, ID band Patient awake    Reviewed: Allergy & Precautions, H&P , NPO status , Patient's Chart, lab work & pertinent test results, reviewed documented beta blocker date and time   Airway Mallampati: I  TM Distance: >3 FB Neck ROM: Full    Dental no notable dental hx. (+) Edentulous Upper, Edentulous Lower, Dental Advisory Given   Pulmonary COPD,  COPD inhaler, Current Smoker and Patient abstained from smoking.,    Pulmonary exam normal breath sounds clear to auscultation       Cardiovascular Exercise Tolerance: Good hypertension, Pt. on medications and Pt. on home beta blockers + CAD and + Peripheral Vascular Disease   Rhythm:Regular Rate:Normal     Neuro/Psych Anxiety Depression Dementia negative neurological ROS     GI/Hepatic Neg liver ROS, GERD  Medicated and Controlled,  Endo/Other  negative endocrine ROS  Renal/GU negative Renal ROS  negative genitourinary   Musculoskeletal   Abdominal   Peds  Hematology negative hematology ROS (+)   Anesthesia Other Findings   Reproductive/Obstetrics negative OB ROS                            Anesthesia Physical Anesthesia Plan  ASA: IV  Anesthesia Plan: General   Post-op Pain Management:    Induction: Intravenous  PONV Risk Score and Plan: 2 and Ondansetron and Midazolam  Airway Management Planned: Oral ETT  Additional Equipment: Arterial line, CVP, PA Cath, TEE and Ultrasound Guidance Line Placement  Intra-op Plan:   Post-operative Plan: Post-operative intubation/ventilation  Informed Consent: I have reviewed the patients History and Physical, chart, labs and discussed the procedure including the risks, benefits and alternatives for the proposed anesthesia with the patient or authorized representative who has indicated his/her understanding and acceptance.     Dental  advisory given  Plan Discussed with: CRNA  Anesthesia Plan Comments:         Anesthesia Quick Evaluation

## 2019-07-14 NOTE — Progress Notes (Signed)
VASCULAR LAB PRELIMINARY  PRELIMINARY  PRELIMINARY  PRELIMINARY  Pre CABG Dopplers completed.    Preliminary report:  See CV proc for preliminary results.  Dezerae Freiberger, RVT 07/14/2019, 12:34 PM

## 2019-07-14 NOTE — Progress Notes (Signed)
Pharmacy Antibiotic Note  Brittany [name] is a 64 y.o. 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    Pt is now on D4 abx with vanc/cefepime for mycotic aneurysm. Her WBC is now normal and remains afebrile. Blood cultures are neg so far. Renal function stable. She will go to OR in AM for repair.   Scr 0.65  Plan: -Cefepime 2 gm IV Q 8 hours -Vancomycin 1250 mg IV Q 24 hours Goal AUC 400-550. Expected AUC: 484 SCr used: 0.7 -Vanc levels as indicated    Height: 5\' 2"  (157.5 cm) Weight: 44.9 kg (99 lb) IBW/kg (Calculated) : 50.1  Temp (24hrs), Avg:98.5 F (36.9 C), Min:98.2 F (36.8 C), Max:98.8 F (37.1 C)  Recent Labs  Lab 07/11/19 0234 07/12/19 0234 07/14/19 0330  WBC 12.1* 10.6* 7.0  CREATININE 0.54 0.64 0.65    Estimated Creatinine Clearance: 52.3 mL/min (by C-G formula based on SCr of 0.65 mg/dL).    Allergies  Allergen Reactions  . Amoxicillin Hives and Rash     Has patient had a PCN reaction causing immediate rash, facial/tongue/throat swelling, SOB or lightheadedness with hypotension: Yes, hives Has patient had a PCN reaction causing severe rash involving mucus membranes or skin necrosis: No Has patient had a PCN reaction that required hospitalization No Has patient had a PCN reaction occurring within the last 10 years: Yes If all of the above answers are "NO", then may proceed with Cephalosporin use.   . Clindamycin/Lincomycin Diarrhea and Other (See Comments)    Patient states that clindamycin causes diarrhea   . Ropinirole Other (See Comments)    "Made me insane" (per the patient)  . Wellbutrin [Bupropion] Diarrhea and Other (See Comments)    Lightheadedness, also  . Sulfamethoxazole Nausea And Vomiting and Rash  . Sulfonamide Derivatives Nausea And Vomiting and Rash    Antimicrobials this admission: Cefepime 4/22 >>  Vanc 4/22 >>  Metronidazole 4/22>>4/23  Dose adjustments this admission:   Microbiology results:  09-[birth year]1989,  PharmD, BCIDP, AAHIVP, CPP Infectious Disease Pharmacist 07/14/2019 11:19 AM

## 2019-07-15 ENCOUNTER — Inpatient Hospital Stay (HOSPITAL_COMMUNITY): Payer: Medicare HMO

## 2019-07-15 ENCOUNTER — Inpatient Hospital Stay (HOSPITAL_COMMUNITY): Payer: Medicare HMO | Admitting: Anesthesiology

## 2019-07-15 ENCOUNTER — Encounter (HOSPITAL_COMMUNITY)
Admission: EM | Disposition: A | Discharge status: Home Health | Payer: Self-pay | Source: Home / Self Care | Attending: Cardiothoracic Surgery

## 2019-07-15 DIAGNOSIS — I712 Thoracic aortic aneurysm, without rupture: Secondary | ICD-10-CM

## 2019-07-15 HISTORY — PX: REPLACEMENT ASCENDING AORTA: SHX6068

## 2019-07-15 HISTORY — PX: TEE WITHOUT CARDIOVERSION: SHX5443

## 2019-07-15 HISTORY — PX: THORACIC AORTIC ENDOVASCULAR STENT GRAFT: SHX6112

## 2019-07-15 LAB — CBC
HCT: 30.6 % — ABNORMAL LOW (ref 36.0–46.0)
Hemoglobin: 9.9 g/dL — ABNORMAL LOW (ref 12.0–15.0)
MCH: 26.3 pg (ref 26.0–34.0)
MCHC: 32.4 g/dL (ref 30.0–36.0)
MCV: 81.2 fL (ref 80.0–100.0)
Platelets: 253 10*3/uL (ref 150–400)
RBC: 3.77 MIL/uL — ABNORMAL LOW (ref 3.87–5.11)
RDW: 19.4 % — ABNORMAL HIGH (ref 11.5–15.5)
WBC: 14.2 10*3/uL — ABNORMAL HIGH (ref 4.0–10.5)
nRBC: 0 % (ref 0.0–0.2)

## 2019-07-15 LAB — POCT I-STAT 7, (LYTES, BLD GAS, ICA,H+H)
Acid-Base Excess: 0 mmol/L (ref 0.0–2.0)
Acid-Base Excess: 1 mmol/L (ref 0.0–2.0)
Acid-base deficit: 2 mmol/L (ref 0.0–2.0)
Acid-base deficit: 3 mmol/L — ABNORMAL HIGH (ref 0.0–2.0)
Acid-base deficit: 5 mmol/L — ABNORMAL HIGH (ref 0.0–2.0)
Acid-base deficit: 1 mmol/L (ref 0.0–2.0)
Acid-base deficit: 1 mmol/L (ref 0.0–2.0)
Acid-base deficit: 1 mmol/L (ref 0.0–2.0)
Bicarbonate: 23.3 mmol/L (ref 20.0–28.0)
Bicarbonate: 24.5 mmol/L (ref 20.0–28.0)
Bicarbonate: 19 mmol/L — ABNORMAL LOW (ref 20.0–28.0)
Bicarbonate: 23.8 mmol/L (ref 20.0–28.0)
Bicarbonate: 24.2 mmol/L (ref 20.0–28.0)
Bicarbonate: 25 mmol/L (ref 20.0–28.0)
Bicarbonate: 23.2 mmol/L (ref 20.0–28.0)
Bicarbonate: 22.7 mmol/L (ref 20.0–28.0)
Calcium, Ion: 1 mmol/L — ABNORMAL LOW (ref 1.15–1.40)
Calcium, Ion: 1.23 mmol/L (ref 1.15–1.40)
Calcium, Ion: 0.89 mmol/L — CL (ref 1.15–1.40)
Calcium, Ion: 0.81 mmol/L — CL (ref 1.15–1.40)
Calcium, Ion: 1.23 mmol/L (ref 1.15–1.40)
Calcium, Ion: 1.06 mmol/L — ABNORMAL LOW (ref 1.15–1.40)
Calcium, Ion: 1.15 mmol/L (ref 1.15–1.40)
Calcium, Ion: 1.13 mmol/L — ABNORMAL LOW (ref 1.15–1.40)
HCT: 19 % — ABNORMAL LOW (ref 36.0–46.0)
HCT: 22 % — ABNORMAL LOW (ref 36.0–46.0)
HCT: 24 % — ABNORMAL LOW (ref 36.0–46.0)
HCT: 24 % — ABNORMAL LOW (ref 36.0–46.0)
HCT: 23 % — ABNORMAL LOW (ref 36.0–46.0)
HCT: 28 % — ABNORMAL LOW (ref 36.0–46.0)
HCT: 28 % — ABNORMAL LOW (ref 36.0–46.0)
HCT: 28 % — ABNORMAL LOW (ref 36.0–46.0)
Hemoglobin: 6.5 g/dL — CL (ref 12.0–15.0)
Hemoglobin: 7.5 g/dL — ABNORMAL LOW (ref 12.0–15.0)
Hemoglobin: 8.2 g/dL — ABNORMAL LOW (ref 12.0–15.0)
Hemoglobin: 8.2 g/dL — ABNORMAL LOW (ref 12.0–15.0)
Hemoglobin: 7.8 g/dL — ABNORMAL LOW (ref 12.0–15.0)
Hemoglobin: 9.5 g/dL — ABNORMAL LOW (ref 12.0–15.0)
Hemoglobin: 9.5 g/dL — ABNORMAL LOW (ref 12.0–15.0)
Hemoglobin: 9.5 g/dL — ABNORMAL LOW (ref 12.0–15.0)
O2 Saturation: 100 %
O2 Saturation: 100 %
O2 Saturation: 100 %
O2 Saturation: 100 %
O2 Saturation: 100 %
O2 Saturation: 95 %
O2 Saturation: 95 %
O2 Saturation: 95 %
Patient temperature: 35.8
Patient temperature: 98.6
Patient temperature: 36.4
Potassium: 3.5 mmol/L (ref 3.5–5.1)
Potassium: 4 mmol/L (ref 3.5–5.1)
Potassium: 4.8 mmol/L (ref 3.5–5.1)
Potassium: 4.5 mmol/L (ref 3.5–5.1)
Potassium: 4.4 mmol/L (ref 3.5–5.1)
Potassium: 4.1 mmol/L (ref 3.5–5.1)
Potassium: 4.3 mmol/L (ref 3.5–5.1)
Potassium: 4.1 mmol/L (ref 3.5–5.1)
Sodium: 137 mmol/L (ref 135–145)
Sodium: 137 mmol/L (ref 135–145)
Sodium: 136 mmol/L (ref 135–145)
Sodium: 138 mmol/L (ref 135–145)
Sodium: 137 mmol/L (ref 135–145)
Sodium: 140 mmol/L (ref 135–145)
Sodium: 138 mmol/L (ref 135–145)
Sodium: 138 mmol/L (ref 135–145)
TCO2: 25 mmol/L (ref 22–32)
TCO2: 26 mmol/L (ref 22–32)
TCO2: 20 mmol/L — ABNORMAL LOW (ref 22–32)
TCO2: 25 mmol/L (ref 22–32)
TCO2: 25 mmol/L (ref 22–32)
TCO2: 26 mmol/L (ref 22–32)
TCO2: 24 mmol/L (ref 22–32)
TCO2: 24 mmol/L (ref 22–32)
pCO2 arterial: 45 mmHg (ref 32.0–48.0)
pCO2 arterial: 50.5 mmHg — ABNORMAL HIGH (ref 32.0–48.0)
pCO2 arterial: 30.9 mmHg — ABNORMAL LOW (ref 32.0–48.0)
pCO2 arterial: 32.5 mmHg (ref 32.0–48.0)
pCO2 arterial: 32.3 mmHg (ref 32.0–48.0)
pCO2 arterial: 43.7 mmHg (ref 32.0–48.0)
pCO2 arterial: 35.4 mmHg (ref 32.0–48.0)
pCO2 arterial: 34.2 mmHg (ref 32.0–48.0)
pH, Arterial: 7.294 — ABNORMAL LOW (ref 7.350–7.450)
pH, Arterial: 7.322 — ABNORMAL LOW (ref 7.350–7.450)
pH, Arterial: 7.398 (ref 7.350–7.450)
pH, Arterial: 7.473 — ABNORMAL HIGH (ref 7.350–7.450)
pH, Arterial: 7.482 — ABNORMAL HIGH (ref 7.350–7.450)
pH, Arterial: 7.36 (ref 7.350–7.450)
pH, Arterial: 7.424 (ref 7.350–7.450)
pH, Arterial: 7.428 (ref 7.350–7.450)
pO2, Arterial: 228 mmHg — ABNORMAL HIGH (ref 83.0–108.0)
pO2, Arterial: 446 mmHg — ABNORMAL HIGH (ref 83.0–108.0)
pO2, Arterial: 261 mmHg — ABNORMAL HIGH (ref 83.0–108.0)
pO2, Arterial: 260 mmHg — ABNORMAL HIGH (ref 83.0–108.0)
pO2, Arterial: 320 mmHg — ABNORMAL HIGH (ref 83.0–108.0)
pO2, Arterial: 74 mmHg — ABNORMAL LOW (ref 83.0–108.0)
pO2, Arterial: 73 mmHg — ABNORMAL LOW (ref 83.0–108.0)
pO2, Arterial: 73 mmHg — ABNORMAL LOW (ref 83.0–108.0)

## 2019-07-15 LAB — CBC WITH DIFFERENTIAL/PLATELET
Abs Immature Granulocytes: 0.03 10*3/uL (ref 0.00–0.07)
Abs Immature Granulocytes: 0.07 10*3/uL (ref 0.00–0.07)
Basophils Absolute: 0.1 10*3/uL (ref 0.0–0.1)
Basophils Absolute: 0 10*3/uL (ref 0.0–0.1)
Basophils Relative: 1 %
Basophils Relative: 0 %
Eosinophils Absolute: 0.1 10*3/uL (ref 0.0–0.5)
Eosinophils Absolute: 0 10*3/uL (ref 0.0–0.5)
Eosinophils Relative: 2 %
Eosinophils Relative: 0 %
HCT: 26 % — ABNORMAL LOW (ref 36.0–46.0)
HCT: 29.6 % — ABNORMAL LOW (ref 36.0–46.0)
Hemoglobin: 7.9 g/dL — ABNORMAL LOW (ref 12.0–15.0)
Hemoglobin: 9.7 g/dL — ABNORMAL LOW (ref 12.0–15.0)
Immature Granulocytes: 0 %
Immature Granulocytes: 1 %
Lymphocytes Relative: 20 %
Lymphocytes Relative: 3 %
Lymphs Abs: 1.3 10*3/uL (ref 0.7–4.0)
Lymphs Abs: 0.4 10*3/uL — ABNORMAL LOW (ref 0.7–4.0)
MCH: 24.9 pg — ABNORMAL LOW (ref 26.0–34.0)
MCH: 25.9 pg — ABNORMAL LOW (ref 26.0–34.0)
MCHC: 30.4 g/dL (ref 30.0–36.0)
MCHC: 32.8 g/dL (ref 30.0–36.0)
MCV: 82 fL (ref 80.0–100.0)
MCV: 78.9 fL — ABNORMAL LOW (ref 80.0–100.0)
Monocytes Absolute: 0.5 10*3/uL (ref 0.1–1.0)
Monocytes Absolute: 0.5 10*3/uL (ref 0.1–1.0)
Monocytes Relative: 8 %
Monocytes Relative: 3 %
Neutro Abs: 4.6 10*3/uL (ref 1.7–7.7)
Neutro Abs: 13.1 10*3/uL — ABNORMAL HIGH (ref 1.7–7.7)
Neutrophils Relative %: 69 %
Neutrophils Relative %: 93 %
Platelets: 459 10*3/uL — ABNORMAL HIGH (ref 150–400)
Platelets: 276 10*3/uL (ref 150–400)
RBC: 3.17 MIL/uL — ABNORMAL LOW (ref 3.87–5.11)
RBC: 3.75 MIL/uL — ABNORMAL LOW (ref 3.87–5.11)
RDW: 19.2 % — ABNORMAL HIGH (ref 11.5–15.5)
RDW: 19.6 % — ABNORMAL HIGH (ref 11.5–15.5)
WBC: 6.7 10*3/uL (ref 4.0–10.5)
WBC: 14.1 10*3/uL — ABNORMAL HIGH (ref 4.0–10.5)
nRBC: 0 % (ref 0.0–0.2)
nRBC: 0 % (ref 0.0–0.2)

## 2019-07-15 LAB — POCT I-STAT, CHEM 8
BUN: 10 mg/dL (ref 8–23)
BUN: 10 mg/dL (ref 8–23)
BUN: 9 mg/dL (ref 8–23)
BUN: 10 mg/dL (ref 8–23)
BUN: 9 mg/dL (ref 8–23)
Calcium, Ion: 1.22 mmol/L (ref 1.15–1.40)
Calcium, Ion: 1.22 mmol/L (ref 1.15–1.40)
Calcium, Ion: 0.96 mmol/L — ABNORMAL LOW (ref 1.15–1.40)
Calcium, Ion: 0.83 mmol/L — CL (ref 1.15–1.40)
Calcium, Ion: 1.23 mmol/L (ref 1.15–1.40)
Chloride: 102 mmol/L (ref 98–111)
Chloride: 102 mmol/L (ref 98–111)
Chloride: 100 mmol/L (ref 98–111)
Chloride: 100 mmol/L (ref 98–111)
Chloride: 101 mmol/L (ref 98–111)
Creatinine, Ser: 0.4 mg/dL — ABNORMAL LOW (ref 0.44–1.00)
Creatinine, Ser: 0.3 mg/dL — ABNORMAL LOW (ref 0.44–1.00)
Creatinine, Ser: 0.2 mg/dL — ABNORMAL LOW (ref 0.44–1.00)
Creatinine, Ser: 0.3 mg/dL — ABNORMAL LOW (ref 0.44–1.00)
Creatinine, Ser: 0.2 mg/dL — ABNORMAL LOW (ref 0.44–1.00)
Glucose, Bld: 169 mg/dL — ABNORMAL HIGH (ref 70–99)
Glucose, Bld: 114 mg/dL — ABNORMAL HIGH (ref 70–99)
Glucose, Bld: 114 mg/dL — ABNORMAL HIGH (ref 70–99)
Glucose, Bld: 148 mg/dL — ABNORMAL HIGH (ref 70–99)
Glucose, Bld: 100 mg/dL — ABNORMAL HIGH (ref 70–99)
HCT: 23 % — ABNORMAL LOW (ref 36.0–46.0)
HCT: 22 % — ABNORMAL LOW (ref 36.0–46.0)
HCT: 22 % — ABNORMAL LOW (ref 36.0–46.0)
HCT: 26 % — ABNORMAL LOW (ref 36.0–46.0)
HCT: 27 % — ABNORMAL LOW (ref 36.0–46.0)
Hemoglobin: 7.8 g/dL — ABNORMAL LOW (ref 12.0–15.0)
Hemoglobin: 7.5 g/dL — ABNORMAL LOW (ref 12.0–15.0)
Hemoglobin: 7.5 g/dL — ABNORMAL LOW (ref 12.0–15.0)
Hemoglobin: 8.8 g/dL — ABNORMAL LOW (ref 12.0–15.0)
Hemoglobin: 9.2 g/dL — ABNORMAL LOW (ref 12.0–15.0)
Potassium: 3.6 mmol/L (ref 3.5–5.1)
Potassium: 3.5 mmol/L (ref 3.5–5.1)
Potassium: 4.2 mmol/L (ref 3.5–5.1)
Potassium: 4.3 mmol/L (ref 3.5–5.1)
Potassium: 4.4 mmol/L (ref 3.5–5.1)
Sodium: 136 mmol/L (ref 135–145)
Sodium: 138 mmol/L (ref 135–145)
Sodium: 133 mmol/L — ABNORMAL LOW (ref 135–145)
Sodium: 137 mmol/L (ref 135–145)
Sodium: 137 mmol/L (ref 135–145)
TCO2: 25 mmol/L (ref 22–32)
TCO2: 22 mmol/L (ref 22–32)
TCO2: 23 mmol/L (ref 22–32)
TCO2: 23 mmol/L (ref 22–32)
TCO2: 25 mmol/L (ref 22–32)

## 2019-07-15 LAB — COMPREHENSIVE METABOLIC PANEL
ALT: 17 U/L (ref 0–44)
AST: 41 U/L (ref 15–41)
Albumin: 2.9 g/dL — ABNORMAL LOW (ref 3.5–5.0)
Alkaline Phosphatase: 35 U/L — ABNORMAL LOW (ref 38–126)
Anion gap: 9 (ref 5–15)
BUN: 13 mg/dL (ref 8–23)
CO2: 22 mmol/L (ref 22–32)
Calcium: 7.5 mg/dL — ABNORMAL LOW (ref 8.9–10.3)
Chloride: 107 mmol/L (ref 98–111)
Creatinine, Ser: 0.54 mg/dL (ref 0.44–1.00)
GFR calc Af Amer: >60 mL/min (ref >60)
GFR calc non Af Amer: >60 mL/min (ref >60)
Glucose, Bld: 170 mg/dL — ABNORMAL HIGH (ref 70–99)
Potassium: 4.3 mmol/L (ref 3.5–5.1)
Sodium: 138 mmol/L (ref 135–145)
Total Bilirubin: 1.2 mg/dL (ref 0.3–1.2)
Total Protein: 4.9 g/dL — ABNORMAL LOW (ref 6.5–8.1)

## 2019-07-15 LAB — PHOSPHORUS
Phosphorus: 3.1 mg/dL (ref 2.5–4.6)
Phosphorus: 3.3 mg/dL (ref 2.5–4.6)

## 2019-07-15 LAB — GLUCOSE, CAPILLARY
Glucose-Capillary: 124 mg/dL — ABNORMAL HIGH (ref 70–99)
Glucose-Capillary: 131 mg/dL — ABNORMAL HIGH (ref 70–99)
Glucose-Capillary: 134 mg/dL — ABNORMAL HIGH (ref 70–99)
Glucose-Capillary: 134 mg/dL — ABNORMAL HIGH (ref 70–99)
Glucose-Capillary: 136 mg/dL — ABNORMAL HIGH (ref 70–99)
Glucose-Capillary: 150 mg/dL — ABNORMAL HIGH (ref 70–99)
Glucose-Capillary: 155 mg/dL — ABNORMAL HIGH (ref 70–99)
Glucose-Capillary: 162 mg/dL — ABNORMAL HIGH (ref 70–99)
Glucose-Capillary: 171 mg/dL — ABNORMAL HIGH (ref 70–99)
Glucose-Capillary: 92 mg/dL (ref 70–99)

## 2019-07-15 LAB — PROTIME-INR
INR: 1.2 (ref 0.8–1.2)
INR: 1.5 — ABNORMAL HIGH (ref 0.8–1.2)
Prothrombin Time: 14.6 seconds (ref 11.4–15.2)
Prothrombin Time: 18.3 seconds — ABNORMAL HIGH (ref 11.4–15.2)

## 2019-07-15 LAB — GRAM STAIN

## 2019-07-15 LAB — PREPARE RBC (CROSSMATCH)

## 2019-07-15 LAB — BASIC METABOLIC PANEL
Anion gap: 7 (ref 5–15)
BUN: 12 mg/dL (ref 8–23)
CO2: 22 mmol/L (ref 22–32)
Calcium: 8.3 mg/dL — ABNORMAL LOW (ref 8.9–10.3)
Chloride: 106 mmol/L (ref 98–111)
Creatinine, Ser: 0.58 mg/dL (ref 0.44–1.00)
GFR calc Af Amer: >60 mL/min (ref >60)
GFR calc non Af Amer: >60 mL/min (ref >60)
Glucose, Bld: 128 mg/dL — ABNORMAL HIGH (ref 70–99)
Potassium: 3.6 mmol/L (ref 3.5–5.1)
Sodium: 135 mmol/L (ref 135–145)

## 2019-07-15 LAB — HEMOGLOBIN AND HEMATOCRIT, BLOOD
HCT: 25.9 % — ABNORMAL LOW (ref 36.0–46.0)
Hemoglobin: 8 g/dL — ABNORMAL LOW (ref 12.0–15.0)

## 2019-07-15 LAB — PLATELET COUNT: Platelets: 34 10*3/uL — ABNORMAL LOW (ref 150–400)

## 2019-07-15 LAB — ECHO INTRAOPERATIVE TEE
Height: 62 in
Weight: 1583.78 oz

## 2019-07-15 LAB — APTT: aPTT: 41 seconds — ABNORMAL HIGH (ref 24–36)

## 2019-07-15 LAB — MAGNESIUM
Magnesium: 1.9 mg/dL (ref 1.7–2.4)
Magnesium: 3.2 mg/dL — ABNORMAL HIGH (ref 1.7–2.4)

## 2019-07-15 LAB — FIBRINOGEN: Fibrinogen: 312 mg/dL (ref 210–475)

## 2019-07-15 SURGERY — REPLACEMENT, AORTA, ASCENDING
Anesthesia: General | Site: Chest

## 2019-07-15 MED ORDER — NITROGLYCERIN IN D5W 200-5 MCG/ML-% IV SOLN
0.0000 ug/min | INTRAVENOUS | Status: DC
  Administered 2019-07-15: 3 ug/min via INTRAVENOUS

## 2019-07-15 MED ORDER — PROTAMINE SULFATE 10 MG/ML IV SOLN
INTRAVENOUS | Status: DC | PRN
  Administered 2019-07-15: 130 mg via INTRAVENOUS

## 2019-07-15 MED ORDER — METOPROLOL TARTRATE 5 MG/5ML IV SOLN: 2.5000 mg | INTRAVENOUS | Status: DC | PRN

## 2019-07-15 MED ORDER — HEPARIN SODIUM (PORCINE) 1000 UNIT/ML IJ SOLN
INTRAMUSCULAR | Status: DC | PRN
  Administered 2019-07-15: 2000 [IU] via INTRAVENOUS
  Administered 2019-07-15: 3000 [IU] via INTRAVENOUS
  Administered 2019-07-15: 7000 [IU] via INTRAVENOUS

## 2019-07-15 MED ORDER — ROCURONIUM BROMIDE 10 MG/ML (PF) SYRINGE
PREFILLED_SYRINGE | INTRAVENOUS | Status: DC | PRN
  Administered 2019-07-15: 70 mg via INTRAVENOUS
  Administered 2019-07-15: 50 mg via INTRAVENOUS
  Administered 2019-07-15: 30 mg via INTRAVENOUS

## 2019-07-15 MED ORDER — DOCUSATE SODIUM 100 MG PO CAPS
200.0000 mg | ORAL_CAPSULE | Freq: Every day | ORAL | Status: DC
  Administered 2019-07-17 – 2019-07-19 (×3): 200 mg via ORAL
  Filled 2019-07-15 (×6): qty 2

## 2019-07-15 MED ORDER — LACTATED RINGERS IV SOLN: INTRAVENOUS | Status: DC | PRN

## 2019-07-15 MED ORDER — HEMOSTATIC AGENTS (NO CHARGE) OPTIME
TOPICAL | Status: DC | PRN
  Administered 2019-07-15: 1 via TOPICAL

## 2019-07-15 MED ORDER — SODIUM CHLORIDE 0.9% FLUSH: 3.0000 mL | INTRAVENOUS | Status: DC | PRN

## 2019-07-15 MED ORDER — POTASSIUM CHLORIDE 10 MEQ/50ML IV SOLN: 10.0000 meq | INTRAVENOUS | Status: AC

## 2019-07-15 MED ORDER — SODIUM CHLORIDE (PF) 0.9 % IJ SOLN
OROMUCOSAL | Status: DC | PRN
  Administered 2019-07-15 (×2): 4 mL via TOPICAL

## 2019-07-15 MED ORDER — MIDAZOLAM HCL 5 MG/5ML IJ SOLN
INTRAMUSCULAR | Status: DC | PRN
  Administered 2019-07-15 (×2): 2 mg via INTRAVENOUS
  Administered 2019-07-15: 1 mg via INTRAVENOUS
  Administered 2019-07-15: 2 mg via INTRAVENOUS

## 2019-07-15 MED ORDER — PROPOFOL 10 MG/ML IV BOLUS
INTRAVENOUS | Status: AC
  Filled 2019-07-15: qty 20

## 2019-07-15 MED ORDER — ACETAMINOPHEN 650 MG RE SUPP
650.0000 mg | Freq: Once | RECTAL | Status: AC
  Administered 2019-07-15: 650 mg via RECTAL

## 2019-07-15 MED ORDER — MIDAZOLAM HCL 2 MG/2ML IJ SOLN: 2.0000 mg | INTRAMUSCULAR | Status: DC | PRN

## 2019-07-15 MED ORDER — LACTATED RINGERS IV SOLN: INTRAVENOUS | Status: DC

## 2019-07-15 MED ORDER — MORPHINE SULFATE (PF) 2 MG/ML IV SOLN
1.0000 mg | INTRAVENOUS | Status: DC | PRN
  Administered 2019-07-15: 2 mg via INTRAVENOUS
  Administered 2019-07-16 (×2): 4 mg via INTRAVENOUS
  Administered 2019-07-16: 2 mg via INTRAVENOUS
  Filled 2019-07-15: qty 2
  Filled 2019-07-15: qty 1
  Filled 2019-07-15: qty 2
  Filled 2019-07-15: qty 1
  Filled 2019-07-15: qty 2

## 2019-07-15 MED ORDER — PHENYLEPHRINE 40 MCG/ML (10ML) SYRINGE FOR IV PUSH (FOR BLOOD PRESSURE SUPPORT)
PREFILLED_SYRINGE | INTRAVENOUS | Status: AC
  Filled 2019-07-15: qty 20

## 2019-07-15 MED ORDER — LACTATED RINGERS IV SOLN: 500.0000 mL | Freq: Once | INTRAVENOUS | Status: DC | PRN

## 2019-07-15 MED ORDER — METOPROLOL TARTRATE 25 MG/10 ML ORAL SUSPENSION
12.5000 mg | Freq: Two times a day (BID) | ORAL | Status: DC
  Filled 2019-07-15 (×5): qty 5

## 2019-07-15 MED ORDER — PROPOFOL 10 MG/ML IV BOLUS
INTRAVENOUS | Status: DC | PRN
  Administered 2019-07-15: 50 mg via INTRAVENOUS
  Administered 2019-07-15: 30 mg via INTRAVENOUS
  Administered 2019-07-15: 50 mg via INTRAVENOUS
  Administered 2019-07-15: 20 mg via INTRAVENOUS

## 2019-07-15 MED ORDER — VANCOMYCIN HCL IN DEXTROSE 1-5 GM/200ML-% IV SOLN
1000.0000 mg | Freq: Once | INTRAVENOUS | Status: AC
  Administered 2019-07-15: 1000 mg via INTRAVENOUS
  Filled 2019-07-15: qty 200

## 2019-07-15 MED ORDER — BISACODYL 5 MG PO TBEC
10.0000 mg | DELAYED_RELEASE_TABLET | Freq: Every day | ORAL | Status: DC
  Administered 2019-07-16 – 2019-07-19 (×4): 10 mg via ORAL
  Filled 2019-07-15 (×6): qty 2

## 2019-07-15 MED ORDER — PROTAMINE SULFATE 10 MG/ML IV SOLN
INTRAVENOUS | Status: AC
  Filled 2019-07-15: qty 25

## 2019-07-15 MED ORDER — ARTIFICIAL TEARS OPHTHALMIC OINT
TOPICAL_OINTMENT | OPHTHALMIC | Status: DC | PRN
  Administered 2019-07-15: 1 via OPHTHALMIC

## 2019-07-15 MED ORDER — HEMOSTATIC AGENTS (NO CHARGE) OPTIME
TOPICAL | Status: DC | PRN
  Administered 2019-07-15 (×2): 1 via TOPICAL

## 2019-07-15 MED ORDER — VANCOMYCIN HCL 1000 MG IV SOLR
INTRAVENOUS | Status: DC | PRN
Start: 2019-07-15 — End: 2019-07-15
  Administered 2019-07-15: 1000 mg

## 2019-07-15 MED ORDER — METHYLPREDNISOLONE SODIUM SUCC 125 MG IJ SOLR
125.0000 mg | Freq: Once | INTRAMUSCULAR | Status: AC
  Administered 2019-07-15: 125 mg via INTRAVENOUS

## 2019-07-15 MED ORDER — SODIUM CHLORIDE 0.9 % IV SOLN: 250.0000 mL | INTRAVENOUS | Status: DC

## 2019-07-15 MED ORDER — ALBUMIN HUMAN 5 % IV SOLN
250.0000 mL | INTRAVENOUS | Status: AC | PRN
  Administered 2019-07-15 (×2): 12.5 g via INTRAVENOUS

## 2019-07-15 MED ORDER — ARTIFICIAL TEARS OPHTHALMIC OINT
TOPICAL_OINTMENT | OPHTHALMIC | Status: AC
  Filled 2019-07-15: qty 3.5

## 2019-07-15 MED ORDER — SODIUM CHLORIDE 0.9% IV SOLUTION: Freq: Once | INTRAVENOUS | Status: DC

## 2019-07-15 MED ORDER — FENTANYL CITRATE (PF) 250 MCG/5ML IJ SOLN
INTRAMUSCULAR | Status: AC
  Filled 2019-07-15: qty 25

## 2019-07-15 MED ORDER — SODIUM CHLORIDE 0.45 % IV SOLN: INTRAVENOUS | Status: DC | PRN

## 2019-07-15 MED ORDER — DEXTROSE 50 % IV SOLN: 0.0000 mL | INTRAVENOUS | Status: DC | PRN

## 2019-07-15 MED ORDER — DEXMEDETOMIDINE HCL IN NACL 400 MCG/100ML IV SOLN: 0.0000 ug/kg/h | INTRAVENOUS | Status: DC

## 2019-07-15 MED ORDER — ACETAMINOPHEN 160 MG/5ML PO SOLN: 650.0000 mg | Freq: Once | ORAL | Status: AC

## 2019-07-15 MED ORDER — MAGNESIUM SULFATE 4 GM/100ML IV SOLN
4.0000 g | Freq: Once | INTRAVENOUS | Status: AC
  Administered 2019-07-15: 4 g via INTRAVENOUS
  Filled 2019-07-15: qty 100

## 2019-07-15 MED ORDER — ACETAMINOPHEN 160 MG/5ML PO SOLN
1000.0000 mg | Freq: Four times a day (QID) | ORAL | Status: AC
  Administered 2019-07-16: 1000 mg
  Filled 2019-07-15: qty 40.6

## 2019-07-15 MED ORDER — VANCOMYCIN HCL 1000 MG IV SOLR
INTRAVENOUS | Status: AC
  Filled 2019-07-15: qty 3000

## 2019-07-15 MED ORDER — METOPROLOL TARTRATE 12.5 MG HALF TABLET
12.5000 mg | ORAL_TABLET | Freq: Two times a day (BID) | ORAL | Status: DC
  Administered 2019-07-15 – 2019-07-24 (×18): 12.5 mg via ORAL
  Filled 2019-07-15 (×19): qty 1

## 2019-07-15 MED ORDER — CHLORHEXIDINE GLUCONATE CLOTH 2 % EX PADS
6.0000 | MEDICATED_PAD | Freq: Every day | CUTANEOUS | Status: DC
  Administered 2019-07-15 – 2019-07-24 (×9): 6 via TOPICAL

## 2019-07-15 MED ORDER — ROCURONIUM BROMIDE 10 MG/ML (PF) SYRINGE
PREFILLED_SYRINGE | INTRAVENOUS | Status: AC
  Filled 2019-07-15: qty 20

## 2019-07-15 MED ORDER — FAMOTIDINE IN NACL 20-0.9 MG/50ML-% IV SOLN
20.0000 mg | Freq: Two times a day (BID) | INTRAVENOUS | Status: AC
  Administered 2019-07-15: 20 mg via INTRAVENOUS

## 2019-07-15 MED ORDER — ASPIRIN 81 MG PO CHEW: 324.0000 mg | CHEWABLE_TABLET | Freq: Every day | ORAL | Status: DC

## 2019-07-15 MED ORDER — SODIUM CHLORIDE 0.9% FLUSH
3.0000 mL | Freq: Two times a day (BID) | INTRAVENOUS | Status: DC
  Administered 2019-07-16 – 2019-07-17 (×3): 3 mL via INTRAVENOUS

## 2019-07-15 MED ORDER — SODIUM CHLORIDE 0.9 % IV SOLN: INTRAVENOUS | Status: DC | PRN

## 2019-07-15 MED ORDER — ASPIRIN EC 325 MG PO TBEC
325.0000 mg | DELAYED_RELEASE_TABLET | Freq: Every day | ORAL | Status: DC
  Administered 2019-07-16: 325 mg via ORAL
  Filled 2019-07-15: qty 1

## 2019-07-15 MED ORDER — HEPARIN SODIUM (PORCINE) 1000 UNIT/ML IJ SOLN
INTRAMUSCULAR | Status: AC
  Filled 2019-07-15: qty 1

## 2019-07-15 MED ORDER — PANTOPRAZOLE SODIUM 40 MG PO TBEC
40.0000 mg | DELAYED_RELEASE_TABLET | Freq: Every day | ORAL | Status: DC
  Administered 2019-07-17 – 2019-07-24 (×8): 40 mg via ORAL
  Filled 2019-07-15 (×8): qty 1

## 2019-07-15 MED ORDER — PHENYLEPHRINE HCL-NACL 20-0.9 MG/250ML-% IV SOLN: 0.0000 ug/min | INTRAVENOUS | Status: DC

## 2019-07-15 MED ORDER — SODIUM CHLORIDE 0.9 % IV SOLN: INTRAVENOUS | Status: DC

## 2019-07-15 MED ORDER — INSULIN REGULAR(HUMAN) IN NACL 100-0.9 UT/100ML-% IV SOLN: INTRAVENOUS | Status: DC

## 2019-07-15 MED ORDER — CHLORHEXIDINE GLUCONATE 0.12 % MT SOLN
15.0000 mL | OROMUCOSAL | Status: AC
  Administered 2019-07-15: 15 mL via OROMUCOSAL
  Filled 2019-07-15: qty 15

## 2019-07-15 MED ORDER — BISACODYL 10 MG RE SUPP
10.0000 mg | Freq: Every day | RECTAL | Status: DC
  Filled 2019-07-15: qty 1

## 2019-07-15 MED ORDER — 0.9 % SODIUM CHLORIDE (POUR BTL) OPTIME
TOPICAL | Status: DC | PRN
  Administered 2019-07-15: 07:00:00 5000 mL

## 2019-07-15 MED ORDER — TRAMADOL HCL 50 MG PO TABS
50.0000 mg | ORAL_TABLET | ORAL | Status: DC | PRN
  Administered 2019-07-16 – 2019-07-17 (×5): 100 mg via ORAL
  Administered 2019-07-20: 50 mg via ORAL
  Administered 2019-07-20: 100 mg via ORAL
  Administered 2019-07-20: 50 mg via ORAL
  Administered 2019-07-21 – 2019-07-22 (×3): 100 mg via ORAL
  Administered 2019-07-23: 50 mg via ORAL
  Administered 2019-07-23 (×3): 100 mg via ORAL
  Filled 2019-07-15 (×3): qty 2
  Filled 2019-07-15: qty 1
  Filled 2019-07-15 (×3): qty 2
  Filled 2019-07-15: qty 1
  Filled 2019-07-15 (×3): qty 2
  Filled 2019-07-15: qty 1
  Filled 2019-07-15 (×3): qty 2

## 2019-07-15 MED ORDER — OXYCODONE HCL 5 MG PO TABS
5.0000 mg | ORAL_TABLET | ORAL | Status: DC | PRN
  Administered 2019-07-15 – 2019-07-22 (×23): 10 mg via ORAL
  Administered 2019-07-23: 5 mg via ORAL
  Administered 2019-07-24 (×2): 10 mg via ORAL
  Filled 2019-07-15 (×19): qty 2
  Filled 2019-07-15: qty 1
  Filled 2019-07-15 (×7): qty 2

## 2019-07-15 MED ORDER — ONDANSETRON HCL 4 MG/2ML IJ SOLN
4.0000 mg | Freq: Four times a day (QID) | INTRAMUSCULAR | Status: DC | PRN
  Administered 2019-07-16 – 2019-07-21 (×2): 4 mg via INTRAVENOUS
  Filled 2019-07-15 (×2): qty 2

## 2019-07-15 MED ORDER — PHENYLEPHRINE 40 MCG/ML (10ML) SYRINGE FOR IV PUSH (FOR BLOOD PRESSURE SUPPORT)
PREFILLED_SYRINGE | INTRAVENOUS | Status: DC | PRN
  Administered 2019-07-15: 80 ug via INTRAVENOUS
  Administered 2019-07-15: 120 ug via INTRAVENOUS
  Administered 2019-07-15: 80 ug via INTRAVENOUS

## 2019-07-15 MED ORDER — SODIUM CHLORIDE 0.9% FLUSH
10.0000 mL | INTRAVENOUS | Status: DC | PRN
  Administered 2019-07-19: 10 mL

## 2019-07-15 MED ORDER — FENTANYL CITRATE (PF) 250 MCG/5ML IJ SOLN
INTRAMUSCULAR | Status: DC | PRN
  Administered 2019-07-15: 50 ug via INTRAVENOUS
  Administered 2019-07-15: 100 ug via INTRAVENOUS
  Administered 2019-07-15: 50 ug via INTRAVENOUS
  Administered 2019-07-15: 100 ug via INTRAVENOUS
  Administered 2019-07-15: 350 ug via INTRAVENOUS
  Administered 2019-07-15 (×2): 100 ug via INTRAVENOUS
  Administered 2019-07-15 (×2): 50 ug via INTRAVENOUS

## 2019-07-15 MED ORDER — ACETAMINOPHEN 500 MG PO TABS
1000.0000 mg | ORAL_TABLET | Freq: Four times a day (QID) | ORAL | Status: AC
  Administered 2019-07-16 – 2019-07-20 (×18): 1000 mg via ORAL
  Filled 2019-07-15 (×19): qty 2

## 2019-07-15 MED ORDER — MIDAZOLAM HCL (PF) 10 MG/2ML IJ SOLN
INTRAMUSCULAR | Status: AC
  Filled 2019-07-15: qty 2

## 2019-07-15 SURGICAL SUPPLY — 135 items
ADAPTER CARDIO PERF ANTE/RETRO (ADAPTER) ×3 IMPLANT
ADAPTER MULTI PERFUSION 15 (ADAPTER) ×1 IMPLANT
ADH SKN CLS LQ APL DERMABOND (GAUZE/BANDAGES/DRESSINGS) ×6
ADPR PRFSN 84XANTGRD RTRGD (ADAPTER) ×2
APL SRG 7X2 LUM MLBL SLNT (VASCULAR PRODUCTS) ×2
APPLICATOR TIP COSEAL (VASCULAR PRODUCTS) ×1 IMPLANT
BAG DECANTER FOR FLEXI CONT (MISCELLANEOUS) ×3 IMPLANT
BLADE CLIPPER SURG (BLADE) ×3 IMPLANT
BLADE STERNUM SYSTEM 6 (BLADE) ×3 IMPLANT
BLADE SURG 15 STRL LF DISP TIS (BLADE) ×2 IMPLANT
BLADE SURG 15 STRL SS (BLADE) ×3
CANISTER SUCT 3000ML PPV (MISCELLANEOUS) ×3 IMPLANT
CANNULA AORTIC ROOT 9FR (CANNULA) ×1 IMPLANT
CANNULA MC2 2 STG 32/40 NON-V (CANNULA) IMPLANT
CANNULA SUMP PERICARDIAL (CANNULA) ×1 IMPLANT
CANNULA VENOUS 2 STG 32/40 (CANNULA) ×3
CATH CPB KIT HENDRICKSON (MISCELLANEOUS) ×3 IMPLANT
CATH HEART VENT LEFT (CATHETERS) ×2 IMPLANT
CATH RETROPLEGIA CORONARY 14FR (CATHETERS) ×3 IMPLANT
CATH ROBINSON RED A/P 18FR (CATHETERS) ×9 IMPLANT
CAUTERY HIGH TEMP VAS (MISCELLANEOUS) ×3 IMPLANT
CLIP VESOCCLUDE LG 6/CT (CLIP) ×1 IMPLANT
CNTNR URN SCR LID CUP LEK RST (MISCELLANEOUS) ×2 IMPLANT
CONN ST 1/4X3/8  BEN (MISCELLANEOUS) ×3
CONN ST 1/4X3/8 BEN (MISCELLANEOUS) IMPLANT
CONN Y 3/8X3/8X3/8  BEN (MISCELLANEOUS) ×3
CONN Y 3/8X3/8X3/8 BEN (MISCELLANEOUS) IMPLANT
CONT SPEC 4OZ STRL OR WHT (MISCELLANEOUS) ×6
COVER MAYO STAND STRL (DRAPES) ×1 IMPLANT
DERMABOND ADHESIVE PROPEN (GAUZE/BANDAGES/DRESSINGS) ×3
DERMABOND ADVANCED .7 DNX6 (GAUZE/BANDAGES/DRESSINGS) IMPLANT
DRAIN CHANNEL 28F RND 3/8 FF (WOUND CARE) ×2 IMPLANT
DRAPE C-ARM 42X72 X-RAY (DRAPES) ×1 IMPLANT
DRAPE CV SPLIT W-CLR ANES SCRN (DRAPES) ×1 IMPLANT
DRAPE INCISE IOBAN 66X45 STRL (DRAPES) ×2 IMPLANT
DRAPE PERI GROIN 82X75IN TIB (DRAPES) ×1 IMPLANT
DRAPE SLUSH/WARMER DISC (DRAPES) IMPLANT
DRSG AQUACEL AG ADV 3.5X 6 (GAUZE/BANDAGES/DRESSINGS) ×1 IMPLANT
DRSG AQUACEL AG ADV 3.5X14 (GAUZE/BANDAGES/DRESSINGS) ×1 IMPLANT
DRSG COVADERM 4X14 (GAUZE/BANDAGES/DRESSINGS) ×3 IMPLANT
DURAGUARD 06CMX08CM IMPLANT
ELECT BLADE 4.0 EZ CLEAN MEGAD (MISCELLANEOUS) ×3
ELECT CAUTERY BLADE 6.4 (BLADE) ×3 IMPLANT
ELECT REM PT RETURN 9FT ADLT (ELECTROSURGICAL) ×6
ELECTRODE BLDE 4.0 EZ CLN MEGD (MISCELLANEOUS) IMPLANT
ELECTRODE REM PT RTRN 9FT ADLT (ELECTROSURGICAL) ×4 IMPLANT
FELT TEFLON 1X6 (MISCELLANEOUS) ×3 IMPLANT
GAUZE SPONGE 4X4 12PLY STRL (GAUZE/BANDAGES/DRESSINGS) ×3 IMPLANT
GAUZE SPONGE 4X4 12PLY STRL LF (GAUZE/BANDAGES/DRESSINGS) ×1 IMPLANT
GLOVE BIO SURGEON STRL SZ 6 (GLOVE) ×2 IMPLANT
GLOVE BIO SURGEON STRL SZ 6.5 (GLOVE) ×3 IMPLANT
GLOVE BIO SURGEON STRL SZ7 (GLOVE) ×1 IMPLANT
GLOVE BIO SURGEON STRL SZ7.5 (GLOVE) ×1 IMPLANT
GLOVE BIOGEL PI IND STRL 6.5 (GLOVE) IMPLANT
GLOVE BIOGEL PI IND STRL 7.0 (GLOVE) IMPLANT
GLOVE BIOGEL PI IND STRL 8 (GLOVE) IMPLANT
GLOVE BIOGEL PI INDICATOR 6.5 (GLOVE) ×2
GLOVE BIOGEL PI INDICATOR 7.0 (GLOVE) ×1
GLOVE BIOGEL PI INDICATOR 8 (GLOVE) ×1
GLOVE ECLIPSE 7.5 STRL STRAW (GLOVE) ×1 IMPLANT
GLOVE NEODERM STRL 7.5  LF PF (GLOVE) ×4
GLOVE NEODERM STRL 7.5 LF PF (GLOVE) ×4 IMPLANT
GLOVE SURG NEODERM 7.5  LF PF (GLOVE) ×2
GOWN STRL REUS W/ TWL LRG LVL3 (GOWN DISPOSABLE) ×8 IMPLANT
GOWN STRL REUS W/TWL LRG LVL3 (GOWN DISPOSABLE) ×24
GRAFT 4 BRANCH 28X50 (Prosthesis & Implant Heart) ×1 IMPLANT
GRAFT CV 30X8WVN NDL (Graft) IMPLANT
GRAFT HEMASHIELD 8MM (Graft) ×3 IMPLANT
HEMOSTAT POWDER SURGIFOAM 1G (HEMOSTASIS) ×8 IMPLANT
HEMOSTAT SURGICEL 2X14 (HEMOSTASIS) ×3 IMPLANT
INSERT FOGARTY SM (MISCELLANEOUS) ×2 IMPLANT
INSERT FOGARTY XLG (MISCELLANEOUS) ×1 IMPLANT
KIT BASIN OR (CUSTOM PROCEDURE TRAY) ×3 IMPLANT
KIT DRAINAGE VACCUM ASSIST (KITS) ×1 IMPLANT
KIT SUCTION CATH 14FR (SUCTIONS) ×3 IMPLANT
KIT TURNOVER KIT B (KITS) ×3 IMPLANT
LINE VENT (MISCELLANEOUS) ×1 IMPLANT
LOOP VESSEL MAXI BLUE (MISCELLANEOUS) ×1 IMPLANT
NS IRRIG 1000ML POUR BTL (IV SOLUTION) ×15 IMPLANT
PACK E OPEN HEART (SUTURE) ×3 IMPLANT
PACK OPEN HEART (CUSTOM PROCEDURE TRAY) ×3 IMPLANT
PAD ARMBOARD 7.5X6 YLW CONV (MISCELLANEOUS) ×6 IMPLANT
PENCIL BUTTON HOLSTER BLD 10FT (ELECTRODE) ×1 IMPLANT
POSITIONER HEAD DONUT 9IN (MISCELLANEOUS) ×3 IMPLANT
POWDER SURGICEL 3.0 GRAM (HEMOSTASIS) ×1 IMPLANT
SEALANT SURG COSEAL 8ML (VASCULAR PRODUCTS) ×1 IMPLANT
SET CARDIOPLEGIA MPS 5001102 (MISCELLANEOUS) ×1 IMPLANT
SPONGE LAP 4X18 RFD (DISPOSABLE) ×2 IMPLANT
STENT GRFT THORAC ACS 26X26X10 (Endovascular Graft) ×1 IMPLANT
STOPCOCK 3 WAY HIGH PRESSURE (MISCELLANEOUS) ×3
STOPCOCK 3WAY HIGH PRESSURE (MISCELLANEOUS) IMPLANT
SUT BONE WAX W31G (SUTURE) ×3 IMPLANT
SUT ETHIBON 2 0 V 52N 30 (SUTURE) ×6 IMPLANT
SUT ETHIBON EXCEL 2-0 V-5 (SUTURE) IMPLANT
SUT ETHIBOND V-5 VALVE (SUTURE) IMPLANT
SUT ETHIBOND X763 2 0 SH 1 (SUTURE) ×1 IMPLANT
SUT MNCRL AB 3-0 PS2 18 (SUTURE) ×1 IMPLANT
SUT PROLENE 3 0 SH 1 (SUTURE) ×3 IMPLANT
SUT PROLENE 3 0 SH 48 (SUTURE) ×9 IMPLANT
SUT PROLENE 3 0 SH DA (SUTURE) ×4 IMPLANT
SUT PROLENE 4 0 RB 1 (SUTURE) ×27
SUT PROLENE 4-0 RB1 .5 CRCL 36 (SUTURE) ×8 IMPLANT
SUT PROLENE 5 0 C 1 36 (SUTURE) ×2 IMPLANT
SUT SILK  1 MH (SUTURE) ×3
SUT SILK 1 MH (SUTURE) IMPLANT
SUT SILK 3 0 (SUTURE) ×3
SUT SILK 3-0 18XBRD TIE 12 (SUTURE) IMPLANT
SUT STEEL 6MS V (SUTURE) ×2 IMPLANT
SUT STEEL STERNAL CCS#1 18IN (SUTURE) IMPLANT
SUT STEEL SZ 6 DBL 3X14 BALL (SUTURE) IMPLANT
SUT VIC AB 1 CTX 36 (SUTURE) ×6
SUT VIC AB 1 CTX36XBRD ANBCTR (SUTURE) ×4 IMPLANT
SUT VIC AB 2-0 CT1 27 (SUTURE) ×3
SUT VIC AB 2-0 CT1 TAPERPNT 27 (SUTURE) IMPLANT
SUT VIC AB 3-0 SH 27 (SUTURE) ×3
SUT VIC AB 3-0 SH 27X BRD (SUTURE) IMPLANT
SUT VIC AB 3-0 X1 27 (SUTURE) IMPLANT
SWAB COLLECTION DEVICE MRSA (MISCELLANEOUS) ×1 IMPLANT
SWAB CULTURE ESWAB REG 1ML (MISCELLANEOUS) ×1 IMPLANT
SYR 20ML ECCENTRIC (SYRINGE) ×1 IMPLANT
SYR 5ML LL (SYRINGE) ×1 IMPLANT
SYSTEM SAHARA CHEST DRAIN ATS (WOUND CARE) ×3 IMPLANT
TAPE CLOTH SURG 4X10 WHT LF (GAUZE/BANDAGES/DRESSINGS) ×1 IMPLANT
TAPE PAPER 2X10 WHT MICROPORE (GAUZE/BANDAGES/DRESSINGS) ×1 IMPLANT
TAPE UMBILICAL COTTON 1/8X30 (MISCELLANEOUS) ×1 IMPLANT
TOWEL GREEN STERILE (TOWEL DISPOSABLE) ×3 IMPLANT
TOWEL GREEN STERILE FF (TOWEL DISPOSABLE) ×3 IMPLANT
TRAY FOLEY SLVR 16FR TEMP STAT (SET/KITS/TRAYS/PACK) ×1 IMPLANT
TUBE SUCT INTRACARD DLP 20F (MISCELLANEOUS) ×1 IMPLANT
TUBING ART PRESS 48 MALE/FEM (TUBING) ×2 IMPLANT
UNDERPAD 30X30 (UNDERPADS AND DIAPERS) ×3 IMPLANT
VENT LEFT HEART 12002 (CATHETERS) ×3
WATER STERILE IRR 1000ML POUR (IV SOLUTION) ×6 IMPLANT
WIRE ROSEN-J .035X260CM (WIRE) ×1 IMPLANT
YANKAUER SUCT BULB TIP NO VENT (SUCTIONS) ×1 IMPLANT

## 2019-07-15 NOTE — Anesthesia Procedure Notes (Signed)
Central Venous Catheter Insertion Performed by: Gaynelle Adu, MD, anesthesiologist Start/End4/26/2021 6:35 AM, 07/15/2019 6:50 AM Patient location: Pre-op. Preanesthetic checklist: patient identified, IV checked, site marked, risks and benefits discussed, surgical consent, monitors and equipment checked, pre-op evaluation, timeout performed and anesthesia consent Hand hygiene performed  and maximum sterile barriers used  PA cath was placed.Swan type:thermodilution PA Cath depth:50 Procedure performed without using ultrasound guided technique. Attempts: 1 Patient tolerated the procedure well with no immediate complications.

## 2019-07-15 NOTE — Op Note (Signed)
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  Catheter selection of the descending thoracic aorta from antegrade approach through sternotomy 2.  Endovascular repair of distal descending thoracic aortic pseudoaneurysm with thoracic stent graft and extension of previous TEVAR  Surgeon: Dr. Cephus Shelling, MD  Assistant: Nathanial Rancher, PA  Indications: Patient is a 64 year old female who previous underwent thoracic stent graft repair of a symptomatic 5.5 cm thoracic aortic aneurysm on 05/11/2019 by Dr. Edilia Bo.  Ultimately she presented last week with ongoing chest pain and was found to have pseudoaneurysmal degeneration of her thoracic aorta proximal and distal to the existing stent.  Certainly concern for mycotic process but her blood cultures have been negative to date.  She is here today for planned reconstruction of her aortic arch with CT surgery as well as likely distal treatment of the pseudoaneurysm with additional stent graft.  Intraoperative cultures were negative on Gram stain today.  Findings: Sternotomy was performed by Dr. Vickey Sages with initiation of hypothermic circulatory arrest.  Initial cultures were negative for any organisms in the OR today.  Ultimately after the aorta was transected just distal to the left subclavian artery we could visualize the previous stent graft in the proximal descending thoracic aorta.  Ultimately the aorta was directly cannulated with a Rosen wire into the distal descending thoracic aorta under fluoroscopy and a 26 mm x 26 mm x 10 cm thoracic stent was deployed for distal coverage of the pseudoaneurysm.  Anesthesia: General  Details: Please see Dr. Vickey Sages dictation for the initial portion of the case.  Ultimately he performed a sternotomy and ultimately had the patient on hypothermic  circulatory arrest.  Ultimately I scrubbed into operating room OR 15 and the proximal descending thoracic aorta had been transected through the sternotomy.  He could directly visualize the previous proximal extent of her thoracic stent graft in the descending thoracic aorta.  Ultimately under direct visualization placed a Rosen wire into the distal descending thoracic aorta past her previous stent graft.  Over this wire we then advanced a 26 mm x 26 mm x 10 cm Gore cTAG device.  Ultimately this was subsequently deployed with a 5 cm of overlap in the previous stent and 5 cm of overlap distally for treatment of her pseudoaneurysm.  We had looked carefully at cross sectional imaging prior to the procedure to ensure that this would allow adequate coverage of the pseudoaneurysm without coverage of her mesenteric arteries.  The stent was then deployed appropriately through all four steps of deployment without any issues.  The wire and delivery system were then directly removed.  Case was turned over to Dr. Vickey Sages.  Complications: None  Condition: Stable  Cephus Shelling, MD [facility] Office: [phone]   Cephus Shelling

## 2019-07-15 NOTE — Progress Notes (Signed)
Rapid wean protocol started at this time 

## 2019-07-15 NOTE — Progress Notes (Signed)
  Echocardiogram Echocardiogram Transesophageal has been performed.  Gerda Diss 07/15/2019, 8:29 AM

## 2019-07-15 NOTE — Anesthesia Postprocedure Evaluation (Signed)
Anesthesia Post Note  Patient: Brittany [name]  Procedure(s) Performed: TOTAL AORTIC ARCH REPLACEMENT USING HEMASHIELD PLATINUM GRAFT SIZE (N/A Chest) TRANSESOPHAGEAL ECHOCARDIOGRAM (TEE) (N/A ) Thoracic Aortic Endovascular Repair of Distal Thoracic Pseudoaneurysm ( Stent Graft) - Using GORE TAG Conformable Thoracic Stent Graft - Size (N/A Chest)     Patient location during evaluation: SICU Anesthesia Type: General Level of consciousness: sedated Pain management: pain level controlled Vital Signs Assessment: post-procedure vital signs reviewed and stable Respiratory status: patient remains intubated per anesthesia plan Cardiovascular status: stable Postop Assessment: no apparent nausea or vomiting Anesthetic complications: no    Last Vitals:  Vitals:   07/15/19 1455 07/15/19 1500  BP: (!) 116/54 113/62  Pulse:  79  Resp: 14 17  Temp:    SpO2: 100% 99%    Last Pain:  Vitals:   07/15/19 0335  TempSrc: Oral  PainSc:                  Santos Sollenberger,W. EDMOND

## 2019-07-15 NOTE — Anesthesia Procedure Notes (Signed)
Central Venous Catheter Insertion Performed by: Roderic Palau, MD, anesthesiologist Start/End4/26/2021 6:35 AM, 07/15/2019 6:50 AM Patient location: Pre-op. Preanesthetic checklist: patient identified, IV checked, site marked, risks and benefits discussed, surgical consent, monitors and equipment checked, pre-op evaluation, timeout performed and anesthesia consent Position: Trendelenburg Lidocaine 1% used for infiltration and patient sedated Hand hygiene performed , maximum sterile barriers used  and Seldinger technique used Catheter size: 9 Fr Total catheter length 10. Central line was placed.MAC introducer Procedure performed using ultrasound guided technique. Ultrasound Notes:anatomy identified, needle tip was noted to be adjacent to the nerve/plexus identified, no ultrasound evidence of intravascular and/or intraneural injection and image(s) printed for medical record Attempts: 1 Following insertion, line sutured, dressing applied and Biopatch. Post procedure assessment: blood return through all ports, free fluid flow and no air  Patient tolerated the procedure well with no immediate complications.

## 2019-07-15 NOTE — Progress Notes (Signed)
Palpable PT pulses BLE after antegrade thoracic stent earlier today.  Remains intubated in ICU.  Cephus Shelling, MD [facility] Office: [phone]   Cephus Shelling

## 2019-07-15 NOTE — Brief Op Note (Signed)
07/15/2019  3:00 PM  PATIENT:  Brittany [name]  64 y.o. female  PRE-OPERATIVE DIAGNOSIS:  AORTIC PSEUDOANEURYSM, s/p TEVAR  POST-OPERATIVE DIAGNOSIS:  SAME  PROCEDURE:  Procedure(s): TOTAL AORTIC ARCH REPLACEMENT USING HEMASHIELD PLATINUM GRAFT SIZE (N/A) TRANSESOPHAGEAL ECHOCARDIOGRAM (TEE) (N/A) Thoracic Aortic Endovascular Repair of Distal Thoracic Pseudoaneurysm ( Stent Graft) - Using GORE TAG Conformable Thoracic Stent Graft - Size (N/A)  SURGEON:  Surgeon(s) and Role:    * Linden Dolin, MD - Primary    * Cephus Shelling, MD  PHYSICIAN ASSISTANT: Barrett, E PA-C Baglia C PA-C  ASSISTANTS: staff   ANESTHESIA:   general  EBL:  1826 mL   BLOOD ADMINISTERED: per anes record  DRAINS: 3 Chest Tube(s) in the mediastinum and bilateral pleural spaces   LOCAL MEDICATIONS USED:  NONE  SPECIMEN:  No Specimen  DISPOSITION OF SPECIMEN:  N/A  COUNTS:  YES  TOURNIQUET:  * No tourniquets in log *  DICTATION: .Note written in EPIC  PLAN OF CARE: Admit to inpatient   PATIENT DISPOSITION:  ICU - intubated and critically ill.   Delay start of Pharmacological VTE agent (>24hrs) due to surgical blood loss or risk of bleeding: yes

## 2019-07-15 NOTE — Anesthesia Procedure Notes (Signed)
Arterial Line Insertion Start/End4/26/2021 6:50 AM, 07/15/2019 7:00 AM Performed by: Gaynelle Adu, MD  Patient location: Pre-op. Preanesthetic checklist: patient identified, IV checked, site marked, risks and benefits discussed, surgical consent, monitors and equipment checked, pre-op evaluation, timeout performed and anesthesia consent Lidocaine 1% used for infiltration Left, brachial was placed Catheter size: 20 Fr Hand hygiene performed  and maximum sterile barriers used   Attempts: 1 Procedure performed using ultrasound guided technique. Ultrasound Notes:anatomy identified, needle tip was noted to be adjacent to the nerve/plexus identified, no ultrasound evidence of intravascular and/or intraneural injection and image(s) printed for medical record Following insertion, dressing applied and Biopatch. Post procedure assessment: normal and unchanged  Patient tolerated the procedure well with no immediate complications.

## 2019-07-15 NOTE — Anesthesia Procedure Notes (Signed)
Arterial Line Insertion Start/End4/26/2021 7:20 AM, 07/15/2019 7:28 AM Performed by: Elliot Dally, CRNA, CRNA  Preanesthetic checklist: patient identified, IV checked, site marked, risks and benefits discussed, surgical consent, monitors and equipment checked, pre-op evaluation, timeout performed and anesthesia consent Right, radial was placed Catheter size: 20 G Hand hygiene performed , maximum sterile barriers used  and Seldinger technique used Allen's test indicative of satisfactory collateral circulation Attempts: 1 Procedure performed without using ultrasound guided technique. Following insertion, dressing applied and Biopatch. Patient tolerated the procedure well with no immediate complications.

## 2019-07-15 NOTE — H&P (Signed)
History and Physical Interval Note:  07/15/2019 7:28 AM  Brittany [name]  has presented today for surgery, with the diagnosis of AORTIC PSEUDOANEURYSM.  The various methods of treatment have been discussed with the patient and family. After consideration of risks, benefits and other options for treatment, the patient has consented to  Procedure(s): TOTAL AORTIC ARCH REPLACEMENT WITH HOMOGRAFT (N/A) TRANSESOPHAGEAL ECHOCARDIOGRAM (TEE) (N/A) as a surgical intervention.  The patient's history has been reviewed, patient examined, no change in status, stable for surgery.  I have reviewed the patient's chart and labs.  Questions were answered to the patient's satisfaction.     Linden Dolin

## 2019-07-15 NOTE — Procedures (Signed)
Extubation Procedure Note  Patient Details:   Name: Brittany [name] DOB: 05-24-1955 MRN: [id number]   Airway Documentation:    Vent end date: 07/15/19 Vent end time: 2100   Evaluation  O2 sats: stable throughout Complications: No apparent complications Patient did tolerate procedure well. Bilateral Breath Sounds: Diminished   Yes, pt able to hoarsely vocalize name and cough to clear secretions after extubation. Pt had NIF of -30 and VC of 600, pt positive for cuff leak prior to extubation. Pt placed on 5l humidified nasal cannula and tolerating well at this time.   Tacy Learn 07/15/2019, 9:13 PM

## 2019-07-15 NOTE — Transfer of Care (Signed)
Immediate Anesthesia Transfer of Care Note  Patient: Brittany [name]  Procedure(s) Performed: TOTAL AORTIC ARCH REPLACEMENT USING HEMASHIELD PLATINUM GRAFT SIZE (N/A Chest) TRANSESOPHAGEAL ECHOCARDIOGRAM (TEE) (N/A ) Thoracic Aortic Endovascular Repair of Distal Thoracic Pseudoaneurysm ( Stent Graft) - Using GORE TAG Conformable Thoracic Stent Graft - Size (N/A Chest)  Patient Location: ICU  Anesthesia Type:General  Level of Consciousness: sedated and Patient remains intubated per anesthesia plan  Airway & Oxygen Therapy: Patient remains intubated per anesthesia plan and Patient placed on Ventilator (see vital sign flow sheet for setting)  Post-op Assessment: Report given to RN and Post -op Vital signs reviewed and stable  Post vital signs: Reviewed and stable  Last Vitals:  Vitals Value Taken Time  BP 113/62 07/15/19 1459  Temp    Pulse 78 07/15/19 1501  Resp 12 07/15/19 1501  SpO2 98 % 07/15/19 1501  Vitals shown include unvalidated device data.  Last Pain:  Vitals:   07/15/19 0335  TempSrc: Oral  PainSc:       Patients Stated Pain Goal: 0 (07/14/19 1400)  Complications: No apparent anesthesia complications

## 2019-07-15 NOTE — Brief Op Note (Signed)
07/11/2019 - 07/15/2019  12:44 PM  PATIENT:  Brittany [name]  64 y.o. female  PRE-OPERATIVE DIAGNOSIS:  AORTIC PSEUDOANEURYSM  POST-OPERATIVE DIAGNOSIS:  AORTIC PSEUDOANEURYSM  PROCEDURE:  Procedure(s): TOTAL AORTIC ARCH REPLACEMENT USING HEMASHIELD PLATINUM GRAFT SIZE (N/A) TRANSESOPHAGEAL ECHOCARDIOGRAM (TEE) (N/A) Thoracic Aortic Endovascular Repair of Distal Thoracic Pseudoaneurysm ( Stent Graft) - Using GORE TAG Conformable Thoracic Stent Graft - Size (N/A)  SURGEON:  Surgeon(s) and Role:    * Linden Dolin, MD - Primary    * Cephus Shelling, MD  PHYSICIAN ASSISTANT: Mathayus Stanbery PA-C, Corina Baglia PA-C  ANESTHESIA:   general  EBL:  1826 mL   BLOOD ADMINISTERED:4 units PRBC and  CELLSAVER  DRAINS: Left Pleural Chest Tubes, Mediastinal Chest Drains   LOCAL MEDICATIONS USED:  BUPIVICAINE   SPECIMEN:  Source of Specimen:  Ascending aorta  DISPOSITION OF SPECIMEN:  PATHOLOGY  COUNTS:  YES  TOURNIQUET:  * No tourniquets in log *  DICTATION: .Dragon Dictation  PLAN OF CARE: Admit to inpatient   PATIENT DISPOSITION:  ICU - intubated and hemodynamically stable.   Delay start of Pharmacological VTE agent (>24hrs) due to surgical blood loss or risk of bleeding: yes

## 2019-07-15 NOTE — Progress Notes (Signed)
[facility]  Plan for aortic arch replacement today with Dr. Vickey Sages and possible antegrade TEVAR   Objective 138/74 (!) 103 98.2 F (36.8 C) (Oral) 20 100%  Intake/Output Summary (Last 24 hours) at 07/15/2019 0730 Last data filed at 07/14/2019 2255 Gross per 24 hour  Intake 250 ml  Output --  Net 250 ml      Laboratory Lab Results: Recent Labs    07/14/19 0330 07/15/19 0352  WBC 7.0 6.7  HGB 8.2* 7.9*  HCT 27.3* 26.0*  PLT 461* 459*   BMET Recent Labs    07/14/19 0330 07/15/19 0352  NA 136 135  K 3.7 3.6  CL 104 106  CO2 23 22  GLUCOSE 147* 128*  BUN 11 12  CREATININE 0.65 0.58  CALCIUM 8.4* 8.3*    COAG Lab Results  Component Value Date   INR 1.2 07/15/2019   INR 1.1 07/11/2019   INR 1.3 (H) 05/29/2019   No results found for: PTT  Assessment/Planning:  64 year old female that presents with proximal and distal pseudoaneurysmal degeneration in the setting of previous TEVAR.  Discussed with Dr. Vickey Sages this morning plan is arch replacement and while on pump potentially antegrade stent of the descending thoracic aorta to treat the distal pseudoaneurysm.  Certainly if initial exposure indicates infectious field will likely not perform antegrade TEVAR.  Consent signed, risk, discussed.  Brittany [name] 07/15/2019 7:30 AM --

## 2019-07-15 NOTE — Anesthesia Procedure Notes (Signed)
Procedure Name: Intubation Date/Time: 07/15/2019 7:58 AM Performed by: Elliot Dally, CRNA Pre-anesthesia Checklist: Patient identified, Emergency Drugs available, Suction available and Patient being monitored Patient Re-evaluated:Patient Re-evaluated prior to induction Oxygen Delivery Method: Circle System Utilized Preoxygenation: Pre-oxygenation with 100% oxygen Induction Type: IV induction Ventilation: Mask ventilation without difficulty Laryngoscope Size: Miller and 2 Grade View: Grade I Tube type: Oral Tube size: 7.5 mm Number of attempts: 1 Airway Equipment and Method: Stylet and Oral airway Placement Confirmation: ETT inserted through vocal cords under direct vision,  positive ETCO2 and breath sounds checked- equal and bilateral Secured at: 20 cm Tube secured with: Tape Dental Injury: Teeth and Oropharynx as per pre-operative assessment

## 2019-07-16 ENCOUNTER — Encounter: Payer: Self-pay | Admitting: *Deleted

## 2019-07-16 ENCOUNTER — Inpatient Hospital Stay (HOSPITAL_COMMUNITY): Payer: Medicare HMO

## 2019-07-16 LAB — BASIC METABOLIC PANEL
Anion gap: 6 (ref 5–15)
Anion gap: 8 (ref 5–15)
BUN: 15 mg/dL (ref 8–23)
BUN: 22 mg/dL (ref 8–23)
CO2: 23 mmol/L (ref 22–32)
CO2: 22 mmol/L (ref 22–32)
Calcium: 7.9 mg/dL — ABNORMAL LOW (ref 8.9–10.3)
Calcium: 8 mg/dL — ABNORMAL LOW (ref 8.9–10.3)
Chloride: 109 mmol/L (ref 98–111)
Chloride: 108 mmol/L (ref 98–111)
Creatinine, Ser: 0.56 mg/dL (ref 0.44–1.00)
Creatinine, Ser: 0.7 mg/dL (ref 0.44–1.00)
GFR calc Af Amer: >60 mL/min (ref >60)
GFR calc Af Amer: >60 mL/min (ref >60)
GFR calc non Af Amer: >60 mL/min (ref >60)
GFR calc non Af Amer: >60 mL/min (ref >60)
Glucose, Bld: 108 mg/dL — ABNORMAL HIGH (ref 70–99)
Glucose, Bld: 143 mg/dL — ABNORMAL HIGH (ref 70–99)
Potassium: 4.1 mmol/L (ref 3.5–5.1)
Potassium: 4.4 mmol/L (ref 3.5–5.1)
Sodium: 138 mmol/L (ref 135–145)
Sodium: 138 mmol/L (ref 135–145)

## 2019-07-16 LAB — PREPARE CRYOPRECIPITATE: Unit division: 0

## 2019-07-16 LAB — PREPARE FRESH FROZEN PLASMA: Unit division: 0

## 2019-07-16 LAB — PREPARE PLATELET PHERESIS
Unit division: 0
Unit division: 0

## 2019-07-16 LAB — MAGNESIUM
Magnesium: 2.8 mg/dL — ABNORMAL HIGH (ref 1.7–2.4)
Magnesium: 2.6 mg/dL — ABNORMAL HIGH (ref 1.7–2.4)

## 2019-07-16 LAB — CBC
HCT: 26.7 % — ABNORMAL LOW (ref 36.0–46.0)
HCT: 28.3 % — ABNORMAL LOW (ref 36.0–46.0)
Hemoglobin: 8.6 g/dL — ABNORMAL LOW (ref 12.0–15.0)
Hemoglobin: 8.7 g/dL — ABNORMAL LOW (ref 12.0–15.0)
MCH: 25.7 pg — ABNORMAL LOW (ref 26.0–34.0)
MCH: 25.5 pg — ABNORMAL LOW (ref 26.0–34.0)
MCHC: 32.2 g/dL (ref 30.0–36.0)
MCHC: 30.7 g/dL (ref 30.0–36.0)
MCV: 79.9 fL — ABNORMAL LOW (ref 80.0–100.0)
MCV: 83 fL (ref 80.0–100.0)
Platelets: 265 10*3/uL (ref 150–400)
Platelets: 268 10*3/uL (ref 150–400)
RBC: 3.34 MIL/uL — ABNORMAL LOW (ref 3.87–5.11)
RBC: 3.41 MIL/uL — ABNORMAL LOW (ref 3.87–5.11)
RDW: 19.7 % — ABNORMAL HIGH (ref 11.5–15.5)
RDW: 20.2 % — ABNORMAL HIGH (ref 11.5–15.5)
WBC: 15 10*3/uL — ABNORMAL HIGH (ref 4.0–10.5)
WBC: 14.5 10*3/uL — ABNORMAL HIGH (ref 4.0–10.5)
nRBC: 0 % (ref 0.0–0.2)
nRBC: 0 % (ref 0.0–0.2)

## 2019-07-16 LAB — GLUCOSE, CAPILLARY
Glucose-Capillary: 141 mg/dL — ABNORMAL HIGH (ref 70–99)
Glucose-Capillary: 119 mg/dL — ABNORMAL HIGH (ref 70–99)
Glucose-Capillary: 122 mg/dL — ABNORMAL HIGH (ref 70–99)
Glucose-Capillary: 112 mg/dL — ABNORMAL HIGH (ref 70–99)
Glucose-Capillary: 108 mg/dL — ABNORMAL HIGH (ref 70–99)
Glucose-Capillary: 118 mg/dL — ABNORMAL HIGH (ref 70–99)
Glucose-Capillary: 131 mg/dL — ABNORMAL HIGH (ref 70–99)
Glucose-Capillary: 107 mg/dL — ABNORMAL HIGH (ref 70–99)

## 2019-07-16 LAB — CULTURE, BLOOD (ROUTINE X 2)
Culture: NO GROWTH
Culture: NO GROWTH
Special Requests: ADEQUATE
Special Requests: ADEQUATE

## 2019-07-16 LAB — SURGICAL PATHOLOGY

## 2019-07-16 LAB — BPAM PLATELET PHERESIS
Blood Product Expiration Date: 202104272359
Blood Product Expiration Date: 202104272359
ISSUE DATE / TIME: 202104261238
ISSUE DATE / TIME: 202104261248
Unit Type and Rh: 6200
Unit Type and Rh: 6200

## 2019-07-16 LAB — BPAM FFP
Blood Product Expiration Date: 202104302359
Blood Product Expiration Date: 202105012359
ISSUE DATE / TIME: 202104261240
ISSUE DATE / TIME: 202104261240
Unit Type and Rh: 600
Unit Type and Rh: 6200

## 2019-07-16 LAB — BPAM CRYOPRECIPITATE
Blood Product Expiration Date: 202104261730
ISSUE DATE / TIME: 202104261253
Unit Type and Rh: 6200

## 2019-07-16 MED ORDER — RESOURCE THICKENUP CLEAR PO POWD
ORAL | Status: DC | PRN
  Administered 2019-07-17: 1.4 g via ORAL
  Filled 2019-07-16: qty 125

## 2019-07-16 MED ORDER — DEXMEDETOMIDINE HCL IN NACL 400 MCG/100ML IV SOLN: 0.0000 ug/kg/h | INTRAVENOUS | Status: DC

## 2019-07-16 MED ORDER — KETOROLAC TROMETHAMINE 15 MG/ML IJ SOLN
7.5000 mg | Freq: Three times a day (TID) | INTRAMUSCULAR | Status: AC
  Administered 2019-07-16 – 2019-07-17 (×4): 7.5 mg via INTRAVENOUS
  Filled 2019-07-16 (×4): qty 1

## 2019-07-16 MED ORDER — GABAPENTIN 600 MG PO TABS
600.0000 mg | ORAL_TABLET | Freq: Three times a day (TID) | ORAL | Status: DC
  Administered 2019-07-16 – 2019-07-24 (×23): 600 mg via ORAL
  Filled 2019-07-16 (×23): qty 1

## 2019-07-16 MED ORDER — SODIUM CHLORIDE 0.9% FLUSH
10.0000 mL | Freq: Two times a day (BID) | INTRAVENOUS | Status: DC
  Administered 2019-07-16 – 2019-07-24 (×17): 10 mL

## 2019-07-16 MED ORDER — ASPIRIN EC 81 MG PO TBEC
81.0000 mg | DELAYED_RELEASE_TABLET | Freq: Every day | ORAL | Status: DC
  Administered 2019-07-17 – 2019-07-24 (×8): 81 mg via ORAL
  Filled 2019-07-16 (×8): qty 1

## 2019-07-16 MED ORDER — ALBUTEROL SULFATE (2.5 MG/3ML) 0.083% IN NEBU
2.5000 mg | INHALATION_SOLUTION | Freq: Four times a day (QID) | RESPIRATORY_TRACT | Status: DC
  Administered 2019-07-16: 2.5 mg via RESPIRATORY_TRACT
  Filled 2019-07-16: qty 3

## 2019-07-16 MED ORDER — THIAMINE HCL 100 MG/ML IJ SOLN
Freq: Once | INTRAVENOUS | Status: AC
  Filled 2019-07-16: qty 1000

## 2019-07-16 MED ORDER — LEVALBUTEROL TARTRATE 45 MCG/ACT IN AERO: 1.0000 | INHALATION_SPRAY | Freq: Four times a day (QID) | RESPIRATORY_TRACT | Status: DC

## 2019-07-16 MED ORDER — SODIUM CHLORIDE 0.9% FLUSH: 10.0000 mL | INTRAVENOUS | Status: DC | PRN

## 2019-07-16 MED ORDER — ALBUTEROL SULFATE (2.5 MG/3ML) 0.083% IN NEBU
2.5000 mg | INHALATION_SOLUTION | Freq: Four times a day (QID) | RESPIRATORY_TRACT | Status: DC
  Administered 2019-07-17 (×2): 2.5 mg via RESPIRATORY_TRACT
  Filled 2019-07-16 (×2): qty 3

## 2019-07-16 NOTE — Progress Notes (Signed)
[facility] [place]  Subjective  - extubated.   Objective 108/64 82 (!) 97.3 F (36.3 C) 16 100%  Intake/Output Summary (Last 24 hours) at 07/16/2019 0756 Last data filed at 07/16/2019 0700 Gross per 24 hour  Intake 4976.36 ml  Output 4281 ml  Net 695.36 ml    Palpable pedal pulses BLE, feet warm  Laboratory Lab Results: Recent Labs    07/15/19 2149 07/15/19 2149 07/15/19 2212 07/16/19 0534  WBC 14.1*  --   --  15.0*  HGB 9.7*   < > 9.5* 8.6*  HCT 29.6*   < > 28.0* 26.7*  PLT 276  --   --  265   < > = values in this interval not displayed.   BMET Recent Labs    07/15/19 2149 07/15/19 2149 07/15/19 2212 07/16/19 0534  NA 138   < > 138 138  K 4.3   < > 4.1 4.1  CL 107  --   --  109  CO2 22  --   --  23  GLUCOSE 170*  --   --  108*  BUN 13  --   --  15  CREATININE 0.54  --   --  0.56  CALCIUM 7.5*  --   --  7.9*   < > = values in this interval not displayed.    COAG Lab Results  Component Value Date   INR 1.5 (H) 07/15/2019   INR 1.2 07/15/2019   INR 1.1 07/11/2019   No results found for: PTT  Assessment/Planning:  Postop day 1 status post antegrade thoracic stent graft in descending thoracic aorta for treatment of distal pseudoaneurysm in conjunction with CT surgery for total aortic arch replacement.  Palpable femoral and pedal pulses.  Overall looks good since extubated.  Vascular will continue to follow.  Cephus Shelling 07/16/2019 7:56 AM --

## 2019-07-16 NOTE — Addendum Note (Signed)
Addendum  created 07/16/19 1552 by Adair Laundry, CRNA   Order list changed

## 2019-07-16 NOTE — Evaluation (Signed)
Clinical/Bedside Swallow Evaluation Patient Details  Name: Brittany [name] MRN: [id number] Date of Birth: September 21, 1955  Today's Date: 07/16/2019 Time: SLP Start Time (ACUTE ONLY): 6226 SLP Stop Time (ACUTE ONLY): 0830 SLP Time Calculation (min) (ACUTE ONLY): 11 min  Past Medical History:  Past Medical History:  Diagnosis Date  . Alzheimer disease (HCC)   . Anxiety   . Bilateral pneumonia 01/2017   per new patient packet   . COPD (chronic obstructive pulmonary disease) (HCC) 01/2017   per new patient packet   . Coronary artery disease   . Depression   . Fatty liver   . Gallbladder disease 2014   per new patient packet   . Hernia, abdominal   . Hypertension   . Liver damage 01/2017   per new patient packet, Dr.Kadakia  . Normal cardiac stress test 2007  . Ovarian cyst 1977   8 1/2 lb left ovarian cyst, Dr.Cox, per new patient packet   . Pneumonia   . Right ovarian cyst    1992, 1993, 1994, 1995, and 1996 Dr.Neil, per new patient packet   . Stroke (HCC)    4 strokes.    Past Surgical History:  Past Surgical History:  Procedure Laterality Date  . ABDOMINAL HYSTERECTOMY Bilateral    Total  . CESAREAN SECTION    . CHOLECYSTECTOMY    . INGUINAL HERNIA REPAIR Right   . LEFT OOPHORECTOMY  1977   with cyst removal, age 15  . MINOR EXCISION OF ORAL LESION N/A 10/07/2017   Procedure: IRRIGATION AND DEBRIDEMENT OF ORAL INFECTION, REMOVAL OF REMAINING 8 TEETH AND PLACEMENT OF PENROSE DRAINS;  Surgeon: Vivia Ewing, DMD;  Location: [facility];  Service: Oral Surgery;  Laterality: N/A;  . OVARIAN CYST REMOVAL Right 93, 94,95, 96  . THORACIC AORTIC ENDOVASCULAR STENT GRAFT N/A 05/11/2019   Procedure: THORACIC AORTIC ENDOVASCULAR STENT GRAFT, RETROPERITONEAL CONDUIT RIGHT ILIAC ARTERY;  Surgeon: Chuck Hint, MD;  Location: [facility] OR;  Service: Vascular;  Laterality: N/A;  . UMBILICAL HERNIA REPAIR     HPI:  Brittany [name]  64 y.o. female who underwent TOTAL AORTIC ARCH REPLACEMENT  secondary to AORTIC PSEUDOANEURYSM, s/p TEVAR. Risk of recurrent laryngeal nerven injury.    Assessment / Plan / Recommendation Clinical Impression  Pt demonstrates no overt CN impairment but does exhibit dysphonia and failed 3 oz water swallow with immediate ineffective coughing. There is also intermittent weak coughing with teaspoons of thin liquids. To fully evaluate pt for potential RLN injury, instrumental assessment with direct visualization is needed. Will plan on FEES today.  SLP Visit Diagnosis: Dysphagia, unspecified (R13.10)    Aspiration Risk  Moderate aspiration risk    Diet Recommendation NPO except meds   Medication Administration: Crushed with puree    Other  Recommendations     Follow up Recommendations        Frequency and Duration            Prognosis        Swallow Study   General HPI: Brittany [name]  64 y.o. female who underwent TOTAL AORTIC ARCH REPLACEMENT secondary to AORTIC PSEUDOANEURYSM, s/p TEVAR. Risk of recurrent laryngeal nerven injury.  Type of Study: Bedside Swallow Evaluation Diet Prior to this Study: Thin liquids Temperature Spikes Noted: No Respiratory Status: Nasal cannula History of Recent Intubation: Yes Length of Intubations (days): 1 days Date extubated: 07/15/19 Behavior/Cognition: Alert;Cooperative Oral Cavity Assessment: Within Functional Limits Oral Care Completed by SLP: No Oral Cavity - Dentition: Edentulous Vision: Functional for  self-feeding Self-Feeding Abilities: Able to feed self Patient Positioning: Upright in bed Baseline Vocal Quality: Hoarse;Breathy Volitional Cough: Weak Volitional Swallow: Able to elicit    Oral/Motor/Sensory Function Overall Oral Motor/Sensory Function: Within functional limits   Ice Chips Ice chips: Not tested   Thin Liquid Thin Liquid: Impaired Presentation: Spoon;Straw Pharyngeal  Phase Impairments: Cough - Immediate    Nectar Thick Nectar Thick Liquid: Not tested   Honey Thick Honey  Thick Liquid: Not tested   Puree Puree: Not tested   Solid     Solid: Not tested      Brittany [name], Brittany [name] 07/16/2019,8:39 AM

## 2019-07-16 NOTE — Hospital Course (Signed)
Brittany [name] is a 64 yo female with history drug abuse, HTN, COPD, H/O stroke, alzheimers disease, CAD, and anxiety depression.   She presented in February of this year with complaints of chest pain.  During that time a CT scan of her chest was obtained and showed a thoracic aortic aneurysm.  She was evaluated by Dr. Edilia Bo with vascular surgery whom performed and emergent stent graft repair.  She again presented to the ED in March with repeat symptoms of chest pain.  CT scan at that time did not show any evidence of endovascular leak.  She presented to the ED on 07/11/2019 with complaints of chest and back pain that had been occurring for the past 3 days.   CT scan was obtained and showed a proximal and distal pseudoaneurysm.  Vascular surgery was consulted however, due to proximal location of the aneurysm CT surgery was consulted for assistance.  She was admitted for blood pressure management and further workup for possible stent graft infection.     Hospital Course:  The patient was evaluated by Dr. Cliffton Asters who felt surgical intervention would likely be required.  He discussed the case with Vasular surgery and Dr. Vickey Sages who was in agreement with surgical intervention and was agreeable to perform her surgery.  The risks and benefits of the procedure were explained to the patient and she was agreeable to proceed.  She was taken to the operating room on 07/15/2019.  She underwent Total Aortic Arch Replacement and placement of thoracic endovascular repair of distal thoracic pseudoaneurysm.  She tolerated the procedure without difficulty and was taken to the SICU in stable condition.  She was extubated the evening of surgery.  During her stay in the SICU the patient was evaluated by SLP for possible laryngeal nerve injury due to the location of her pseudoaneurysm.  The performed a bedside evaluation which the patient exhibited evidence of aspiration and dysphonia.  It was felt a FEES would be indicated.  This  showed evidence of dysphagia with increased risk of aspiration.  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  She was placed on nectar thick liquids and pureed diet.

## 2019-07-16 NOTE — Progress Notes (Signed)
1 Day Post-Op Procedure(s) (LRB): TOTAL AORTIC ARCH REPLACEMENT USING HEMASHIELD PLATINUM GRAFT SIZE (N/A) TRANSESOPHAGEAL ECHOCARDIOGRAM (TEE) (N/A) Thoracic Aortic Endovascular Repair of Distal Thoracic Pseudoaneurysm ( Stent Graft) - Using GORE TAG Conformable Thoracic Stent Graft - Size (N/A) Subjective: No complaints  Objective: Vital signs in last 24 hours: Temp:  [96.1 F (35.6 C)-98.1 F (36.7 C)] 97.3 F (36.3 C) (04/27 0700) Pulse Rate:  [45-88] 82 (04/27 0700) Cardiac Rhythm: Normal sinus rhythm (04/26 1951) Resp:  [12-35] 16 (04/27 0700) BP: (98-153)/(54-88) 108/64 (04/27 0700) SpO2:  [85 %-100 %] 100 % (04/27 0700) Arterial Line BP: (100-255)/(49-97) 255/54 (04/27 0700) FiO2 (%):  [40 %-50 %] 40 % (04/26 2033) Weight:  [54 kg] 54 kg (04/27 0600)  Hemodynamic parameters for last 24 hours: PAP: (16-49)/(5-25) 20/9 CO:  [2.2 L/min-4.3 L/min] 3.2 L/min CI:  [1.6 L/min/m2-3 L/min/m2] 2.2 L/min/m2  Intake/Output from previous day: 04/26 0701 - 04/27 0700 In: 4976.4 [I.V.:2852.8; Blood:1191; IV Piggyback:932.6] Out: 4281 [Urine:1725; Emesis/NG output:150; Blood:1826; Chest Tube:580] Intake/Output this shift: No intake/output data recorded.  General appearance: alert and cooperative Neurologic: intact Heart: regular rate and rhythm, S1, S2 normal, no murmur, click, rub or gallop Lungs: diminished breath sounds bilaterally Abdomen: soft, non-tender; bowel sounds normal; no masses,  no organomegaly Extremities: edema 2+ Wound: dressed  Lab Results: Recent Labs    07/15/19 2149 07/15/19 2149 07/15/19 2212 07/16/19 0534  WBC 14.1*  --   --  15.0*  HGB 9.7*   < > 9.5* 8.6*  HCT 29.6*   < > 28.0* 26.7*  PLT 276  --   --  265   < > = values in this interval not displayed.   BMET:  Recent Labs    07/15/19 2149 07/15/19 2149 07/15/19 2212 07/16/19 0534  NA 138   < > 138 138  K 4.3   < > 4.1 4.1  CL 107  --   --  109  CO2 22  --   --  23  GLUCOSE  170*  --   --  108*  BUN 13  --   --  15  CREATININE 0.54  --   --  0.56  CALCIUM 7.5*  --   --  7.9*   < > = values in this interval not displayed.    PT/INR:  Recent Labs    07/15/19 1518  LABPROT 18.3*  INR 1.5*   ABG    Component Value Date/Time   PHART 7.428 07/15/2019 2212   HCO3 22.7 07/15/2019 2212   TCO2 24 07/15/2019 2212   ACIDBASEDEF 1.0 07/15/2019 2212   O2SAT 95.0 07/15/2019 2212   CBG (last 3)  Recent Labs    07/16/19 0500 07/16/19 0609 07/16/19 0659  GLUCAP 108* 131* 118*    Assessment/Plan: S/P Procedure(s) (LRB): TOTAL AORTIC ARCH REPLACEMENT USING HEMASHIELD PLATINUM GRAFT SIZE (N/A) TRANSESOPHAGEAL ECHOCARDIOGRAM (TEE) (N/A) Thoracic Aortic Endovascular Repair of Distal Thoracic Pseudoaneurysm ( Stent Graft) - Using GORE TAG Conformable Thoracic Stent Graft - Size (N/A) Mobilize SLP assessment--high risk for left RLN injry due to inflammatory nature of pseudoaneurysm   LOS: 5 days    Brittany [name] 07/16/2019

## 2019-07-16 NOTE — Consult Note (Signed)
Ref: Ngetich, Donalee Citrin, NP   Subjective:  Awake. Positive cough with h/o smoking. Afebrile. Monitor shows sinus rhythm. BMET near normal. Hgb down to 8.6gm. with recent vascular surgery.  Objective:  Vital Signs in the last 24 hours: Temp:  [96.1 F (35.6 C)-98.1 F (36.7 C)] 97.5 F (36.4 C) (04/27 0800) Pulse Rate:  [45-88] 80 (04/27 0800) Cardiac Rhythm: Normal sinus rhythm (04/26 1951) Resp:  [12-35] 21 (04/27 0800) BP: (98-153)/(54-88) 118/67 (04/27 0800) SpO2:  [85 %-100 %] 100 % (04/27 0800) Arterial Line BP: (100-255)/(49-97) 120/60 (04/27 0800) FiO2 (%):  [40 %-50 %] 40 % (04/26 2033) Weight:  [54 kg] 54 kg (04/27 0600)  Physical Exam: BP Readings from Last 1 Encounters:  07/16/19 118/67     Wt Readings from Last 1 Encounters:  07/16/19 54 kg    Weight change: 9.1 kg Body mass index is 21.77 kg/m. HEENT: [place]/AT, Eyes-Blue, PERL, EOMI, Conjunctiva-Pale, Sclera-Non-icteric Neck: No JVD, No bruit, Trachea midline. Lungs:  Clearing, Bilateral. Mid line scar of surgery with dressing. Cardiac:  Regular rhythm, normal S1 and S2, no S3. II/VI systolic murmur. Abdomen:  Soft, non-tender. BS present. Extremities:  No edema present. No cyanosis. No clubbing. CNS: AxOx3, Cranial nerves grossly intact, moves all 4 extremities.  Skin: Warm and dry.   Intake/Output from previous day: 04/26 0701 - 04/27 0700 In: 4976.4 [I.V.:2852.8; Blood:1191; IV Piggyback:932.6] Out: 4301 [Urine:1745; Emesis/NG output:150; Blood:1826; Chest Tube:580]    Lab Results: BMET    Component Value Date/Time   NA 138 07/16/2019 0534   NA 138 07/15/2019 2212   NA 138 07/15/2019 2149   K 4.1 07/16/2019 0534   K 4.1 07/15/2019 2212   K 4.3 07/15/2019 2149   CL 109 07/16/2019 0534   CL 107 07/15/2019 2149   CL 101 07/15/2019 1351   CO2 23 07/16/2019 0534   CO2 22 07/15/2019 2149   CO2 22 07/15/2019 0352   GLUCOSE 108 (H) 07/16/2019 0534   GLUCOSE 170 (H) 07/15/2019 2149   GLUCOSE 100 (H)  07/15/2019 1351   BUN 15 07/16/2019 0534   BUN 13 07/15/2019 2149   BUN 9 07/15/2019 1351   CREATININE 0.56 07/16/2019 0534   CREATININE 0.54 07/15/2019 2149   CREATININE 0.20 (L) 07/15/2019 1351   CREATININE 0.68 09/20/2017 1030   CREATININE 0.73 07/18/2017 1414   CREATININE 0.60 03/08/2017 1231   CALCIUM 7.9 (L) 07/16/2019 0534   CALCIUM 7.5 (L) 07/15/2019 2149   CALCIUM 8.3 (L) 07/15/2019 0352   GFRNONAA >60 07/16/2019 0534   GFRNONAA >60 07/15/2019 2149   GFRNONAA >60 07/15/2019 0352   GFRNONAA 96 09/20/2017 1030   GFRNONAA 100 03/08/2017 1231   GFRNONAA >90 01/18/2011 1053   GFRAA >60 07/16/2019 0534   GFRAA >60 07/15/2019 2149   GFRAA >60 07/15/2019 0352   GFRAA 111 09/20/2017 1030   GFRAA 116 03/08/2017 1231   GFRAA >90 01/18/2011 1053   CBC    Component Value Date/Time   WBC 15.0 (H) 07/16/2019 0534   RBC 3.34 (L) 07/16/2019 0534   HGB 8.6 (L) 07/16/2019 0534   HCT 26.7 (L) 07/16/2019 0534   PLT 265 07/16/2019 0534   MCV 79.9 (L) 07/16/2019 0534   MCH 25.7 (L) 07/16/2019 0534   MCHC 32.2 07/16/2019 0534   RDW 19.7 (H) 07/16/2019 0534   LYMPHSABS 0.4 (L) 07/15/2019 2149   MONOABS 0.5 07/15/2019 2149   EOSABS 0.0 07/15/2019 2149   BASOSABS 0.0 07/15/2019 2149   HEPATIC Function  Panel Recent Labs    01/05/19 2318 05/28/19 2143 07/15/19 2149  PROT 5.2* 6.9 4.9*   HEMOGLOBIN A1C No components found for: HGA1C,  MPG CARDIAC ENZYMES Lab Results  Component Value Date   CKTOTAL 38 09/20/2017   TROPONINI <0.03 11/28/2016   TROPONINI <0.30 07/11/2013   TROPONINI <0.30 02/28/2013   BNP No results for input(s): PROBNP in the last 8760 hours. TSH No results for input(s): TSH in the last 8760 hours. CHOLESTEROL Recent Labs    01/06/19 0628 05/29/19 0440  CHOL 127 102    Scheduled Meds: . sodium chloride   Intravenous Once  . acetaminophen  1,000 mg Oral Q6H   Or  . acetaminophen (TYLENOL) oral liquid 160 mg/5 mL  1,000 mg Per Tube Q6H  . aspirin  EC  325 mg Oral Daily   Or  . aspirin  324 mg Per Tube Daily  . atorvastatin  10 mg Oral q1800  . bisacodyl  10 mg Oral Daily   Or  . bisacodyl  10 mg Rectal Daily  . Chlorhexidine Gluconate Cloth  6 each Topical Daily  . citalopram  20 mg Oral Daily  . docusate sodium  200 mg Oral Daily  . gabapentin  600 mg Oral BID  . metoprolol tartrate  12.5 mg Oral BID   Or  . metoprolol tartrate  12.5 mg Per Tube BID  . nicotine  14 mg Transdermal Daily  . [START ON 07/17/2019] pantoprazole  40 mg Oral Daily  . sodium chloride flush  10-40 mL Intracatheter Q12H  . sodium chloride flush  3 mL Intravenous Q12H   Continuous Infusions: . sodium chloride 10 mL/hr at 07/16/19 0700  . sodium chloride    . sodium chloride 10 mL/hr at 07/15/19 1518  . albumin human 12.5 g (07/15/19 1657)  . ceFEPime (MAXIPIME) IV 2 g (07/16/19 0529)  . dexmedetomidine (PRECEDEX) IV infusion Stopped (07/16/19 0524)  . famotidine (PEPCID) IV Stopped (07/15/19 1535)  . insulin 0.6 mL/hr at 07/16/19 0700  . lactated ringers    . lactated ringers 20 mL/hr at 07/16/19 0700  . lactated ringers    . nitroGLYCERIN Stopped (07/15/19 1812)  . phenylephrine (NEO-SYNEPHRINE) Adult infusion 0 mcg/min (07/16/19 0524)  . banana bag IV 1000 mL     PRN Meds:.sodium chloride, albumin human, albuterol, dextrose, ipratropium-albuterol, lactated ringers, metoprolol tartrate, midazolam, morphine injection, ondansetron (ZOFRAN) IV, oxyCODONE, sodium chloride flush, sodium chloride flush, sodium chloride flush, traMADol  Assessment/Plan: Antegrade thoracic stent graft in descending thoracic aorta Thoracic aorta pseudoaneurysm Aortic arch replacement Hypertension Type 2 DM Anemia of blood loss Moderate protein calorie malnutrition S/P stroke Tobacco use disorder Chronic anxiety  Continue medical treatment.   LOS: 5 days   Time spent including chart review, lab review, examination, discussion with patient : 30 min   Dixie Dials  MD  07/16/2019, 9:08 AM

## 2019-07-16 NOTE — Plan of Care (Signed)

## 2019-07-16 NOTE — Procedures (Signed)
Objective Swallowing Evaluation: Type of Study: FEES-Fiberoptic Endoscopic Evaluation of Swallow   Patient Details  Name: Brittany [name] MRN: [id number] Date of Birth: June 05, 1955  Today's Date: 07/16/2019 Time: SLP Start Time (ACUTE ONLY): 4696 -SLP Stop Time (ACUTE ONLY): 0955  SLP Time Calculation (min) (ACUTE ONLY): 16 min   Past Medical History:  Past Medical History:  Diagnosis Date  . Alzheimer disease ([place])   . Anxiety   . Bilateral pneumonia 01/2017   per new patient packet   . COPD (chronic obstructive pulmonary disease) ([place]) 01/2017   per new patient packet   . Coronary artery disease   . Depression   . Fatty liver   . Gallbladder disease 2014   per new patient packet   . Hernia, abdominal   . Hypertension   . Liver damage 01/2017   per new patient packet, Dr.Kadakia  . Normal cardiac stress test 2007  . Ovarian cyst 1977   8 1/2 lb left ovarian cyst, Dr.Cox, per new patient packet   . Pneumonia   . Right ovarian cyst    1992, 1993, 1994, 1995, and 1996 Dr.Neil, per new patient packet   . Stroke ([place])    4 strokes.    Past Surgical History:  Past Surgical History:  Procedure Laterality Date  . ABDOMINAL HYSTERECTOMY Bilateral    Total  . CESAREAN SECTION    . CHOLECYSTECTOMY    . INGUINAL HERNIA REPAIR Right   . LEFT OOPHORECTOMY  1977   with cyst removal, age 54  . MINOR EXCISION OF ORAL LESION N/A 10/07/2017   Procedure: IRRIGATION AND DEBRIDEMENT OF ORAL INFECTION, REMOVAL OF REMAINING 8 TEETH AND PLACEMENT OF PENROSE DRAINS;  Surgeon: Michael Litter, DMD;  Location: [facility];  Service: Oral Surgery;  Laterality: N/A;  . OVARIAN CYST REMOVAL Right 93, 94,95, 96  . REPLACEMENT ASCENDING AORTA N/A 07/15/2019   Procedure: TOTAL AORTIC ARCH REPLACEMENT USING HEMASHIELD PLATINUM GRAFT SIZE 28MM;  Surgeon: Wonda Olds, MD;  Location: [place];  Service: Open Heart Surgery;  Laterality: N/A;  . TEE WITHOUT CARDIOVERSION N/A 07/15/2019   Procedure:  TRANSESOPHAGEAL ECHOCARDIOGRAM (TEE);  Surgeon: Wonda Olds, MD;  Location: [place];  Service: Open Heart Surgery;  Laterality: N/A;  . THORACIC AORTIC ENDOVASCULAR STENT GRAFT N/A 05/11/2019   Procedure: THORACIC AORTIC ENDOVASCULAR STENT GRAFT, RETROPERITONEAL CONDUIT RIGHT ILIAC ARTERY;  Surgeon: Angelia Mould, MD;  Location: [facility] OR;  Service: Vascular;  Laterality: N/A;  . THORACIC AORTIC ENDOVASCULAR STENT GRAFT N/A 07/15/2019   Procedure: Thoracic Aortic Endovascular Repair of Distal Thoracic Pseudoaneurysm ( Stent Graft) - Using GORE TAG Conformable Thoracic Stent Graft - Size 26MM;  Surgeon: Wonda Olds, MD;  Location: [place];  Service: Open Heart Surgery;  Laterality: N/A;  . UMBILICAL HERNIA REPAIR     HPI: Illianna Paschal  64 y.o. female who underwent TOTAL AORTIC ARCH REPLACEMENT secondary to AORTIC PSEUDOANEURYSM, s/p TEVAR. Risk of recurrent laryngeal nerven injury.    No data recorded   Assessment / Plan / Recommendation  CHL IP CLINICAL IMPRESSIONS 07/16/2019  Clinical Impression Pt demonstrates dysphagia and increased risk of aspiration due to impaired timing of swallow mechanism as well as left unilateral paralysis of the arytenoid impacting airway closure and cough efficacy. 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 With thin liquids there is consistent spill of liquids to the pyriforms. Given incomplete laryngeal closure and late epiglottic deflection pt does  have instances of aspiration, even with a chin tuck. Pt was constantly coughing throughout session, expectorating clear secretions with max effort and so sensation of each aspraition event could not be confirmed. Best strategy to reduce risk of aspiraiton at this time is thickening liquids to nectar with single straw sips in chin tuck position which improves epiglottic protection of airway and slows bolus. Purees are recommended at this  time given lack of dentition and no MD clearance of diet advancement yet. Will follow for activities to facilitate laryngeal closure, though ENT consult may ultimately be needed if there is no spontaneous improvement in phonation subjectively.   SLP Visit Diagnosis Dysphagia, pharyngeal phase (R13.13)  Attention and concentration deficit following --  Frontal lobe and executive function deficit following --  Impact on safety and function Moderate aspiration risk      CHL IP TREATMENT RECOMMENDATION 07/16/2019  Treatment Recommendations Therapy as outlined in treatment plan below     Prognosis 07/16/2019  Prognosis for Safe Diet Advancement Good  Barriers to Reach Goals --  Barriers/Prognosis Comment --    CHL IP DIET RECOMMENDATION 07/16/2019  SLP Diet Recommendations Nectar thick liquid;Dysphagia 1 (Puree) solids  Liquid Administration via Cup;Straw  Medication Administration Crushed with puree  Compensations Chin tuck;Slow rate  Postural Changes --      CHL IP OTHER RECOMMENDATIONS 07/16/2019  Recommended Consults Consider ENT evaluation  Oral Care Recommendations --  Other Recommendations --      CHL IP FOLLOW UP RECOMMENDATIONS 07/16/2019  Follow up Recommendations Skilled Nursing facility      [facility] IP FREQUENCY AND DURATION 07/16/2019  Speech Therapy Frequency (ACUTE ONLY) min 2x/week  Treatment Duration 2 weeks           CHL IP ORAL PHASE 07/16/2019  Oral Phase WFL  Oral - Pudding Teaspoon --  Oral - Pudding Cup --  Oral - Honey Teaspoon --  Oral - Honey Cup --  Oral - Nectar Teaspoon --  Oral - Nectar Cup --  Oral - Nectar Straw --  Oral - Thin Teaspoon --  Oral - Thin Cup --  Oral - Thin Straw --  Oral - Puree --  Oral - Mech Soft --  Oral - Regular --  Oral - Multi-Consistency --  Oral - Pill --  Oral Phase - Comment --    CHL IP PHARYNGEAL PHASE 07/16/2019  Pharyngeal Phase Impaired  Pharyngeal- Pudding Teaspoon --  Pharyngeal --  Pharyngeal- Pudding  Cup --  Pharyngeal --  Pharyngeal- Honey Teaspoon --  Pharyngeal --  Pharyngeal- Honey Cup --  Pharyngeal --  Pharyngeal- Nectar Teaspoon --  Pharyngeal --  Pharyngeal- Nectar Cup Delayed swallow initiation-pyriform sinuses;Reduced airway/laryngeal closure  Pharyngeal --  Pharyngeal- Nectar Straw Delayed swallow initiation-pyriform sinuses;Reduced airway/laryngeal closure;Delayed swallow initiation-vallecula;Compensatory strategies attempted (with notebox)  Pharyngeal --  Pharyngeal- Thin Teaspoon Delayed swallow initiation-pyriform sinuses;Penetration/Aspiration before swallow;Reduced airway/laryngeal closure  Pharyngeal Material enters airway, CONTACTS cords and then ejected out  Pharyngeal- Thin Cup Penetration/Aspiration before swallow;Reduced airway/laryngeal closure  Pharyngeal Material enters airway, passes BELOW cords then ejected out  Pharyngeal- Thin Straw Delayed swallow initiation-pyriform sinuses;Penetration/Aspiration before swallow;Compensatory strategies attempted (with notebox)  Pharyngeal Material enters airway, passes BELOW cords then ejected out  Pharyngeal- Puree --  Pharyngeal --  Pharyngeal- Mechanical Soft --  Pharyngeal --  Pharyngeal- Regular --  Pharyngeal --  Pharyngeal- Multi-consistency --  Pharyngeal --  Pharyngeal- Pill --  Pharyngeal --  Pharyngeal Comment --     No flowsheet data  found.  Harlon Ditty, MA CCC-SLP  Acute Rehabilitation Services Pager [phone] Office [phone]  Claudine Mouton 07/16/2019, 11:17 AM

## 2019-07-16 NOTE — Progress Notes (Signed)
TCTS BRIEF SICU PROGRESS NOTE  1 Day Post-Op  S/P Procedure(s) (LRB): TOTAL AORTIC ARCH REPLACEMENT USING HEMASHIELD PLATINUM GRAFT SIZE (N/A) TRANSESOPHAGEAL ECHOCARDIOGRAM (TEE) (N/A) Thoracic Aortic Endovascular Repair of Distal Thoracic Pseudoaneurysm ( Stent Graft) - Using GORE TAG Conformable Thoracic Stent Graft - Size (N/A)   Stable day NSR w/ stable BP Breathing comfortably w/ O2 sats 100% UOP adequate Labs okay  Plan: Continue current plan  Purcell Nails, MD 07/16/2019 6:41 PM

## 2019-07-17 ENCOUNTER — Inpatient Hospital Stay (HOSPITAL_COMMUNITY): Payer: Medicare HMO

## 2019-07-17 LAB — BASIC METABOLIC PANEL
Anion gap: 10 (ref 5–15)
BUN: 24 mg/dL — ABNORMAL HIGH (ref 8–23)
CO2: 22 mmol/L (ref 22–32)
Calcium: 7.9 mg/dL — ABNORMAL LOW (ref 8.9–10.3)
Chloride: 107 mmol/L (ref 98–111)
Creatinine, Ser: 0.7 mg/dL (ref 0.44–1.00)
GFR calc Af Amer: >60 mL/min (ref >60)
GFR calc non Af Amer: >60 mL/min (ref >60)
Glucose, Bld: 157 mg/dL — ABNORMAL HIGH (ref 70–99)
Potassium: 4.1 mmol/L (ref 3.5–5.1)
Sodium: 139 mmol/L (ref 135–145)

## 2019-07-17 LAB — CBC
HCT: 27.5 % — ABNORMAL LOW (ref 36.0–46.0)
Hemoglobin: 8.4 g/dL — ABNORMAL LOW (ref 12.0–15.0)
MCH: 25.6 pg — ABNORMAL LOW (ref 26.0–34.0)
MCHC: 30.5 g/dL (ref 30.0–36.0)
MCV: 83.8 fL (ref 80.0–100.0)
Platelets: 253 10*3/uL (ref 150–400)
RBC: 3.28 MIL/uL — ABNORMAL LOW (ref 3.87–5.11)
RDW: 20.4 % — ABNORMAL HIGH (ref 11.5–15.5)
WBC: 10.9 10*3/uL — ABNORMAL HIGH (ref 4.0–10.5)
nRBC: 0 % (ref 0.0–0.2)

## 2019-07-17 MED ORDER — FUROSEMIDE 10 MG/ML IJ SOLN
40.0000 mg | Freq: Once | INTRAMUSCULAR | Status: AC
  Administered 2019-07-17: 40 mg via INTRAVENOUS
  Filled 2019-07-17: qty 4

## 2019-07-17 MED ORDER — ORAL CARE MOUTH RINSE
15.0000 mL | Freq: Two times a day (BID) | OROMUCOSAL | Status: DC
  Administered 2019-07-16 – 2019-07-20 (×8): 15 mL via OROMUCOSAL

## 2019-07-17 MED FILL — Mannitol IV Soln 20%: INTRAVENOUS | Qty: 500 | Status: AC

## 2019-07-17 MED FILL — Calcium Chloride Inj 10%: INTRAVENOUS | Qty: 10 | Status: AC

## 2019-07-17 MED FILL — Potassium Chloride Inj 2 mEq/ML: INTRAVENOUS | Qty: 40 | Status: AC

## 2019-07-17 MED FILL — Sodium Bicarbonate IV Soln 8.4%: INTRAVENOUS | Qty: 100 | Status: AC

## 2019-07-17 MED FILL — Electrolyte-R (PH 7.4) Solution: INTRAVENOUS | Qty: 6000 | Status: AC

## 2019-07-17 MED FILL — Heparin Sodium (Porcine) Inj 1000 Unit/ML: INTRAMUSCULAR | Qty: 30 | Status: AC

## 2019-07-17 MED FILL — Heparin Sodium (Porcine) Inj 1000 Unit/ML: INTRAMUSCULAR | Qty: 2500 | Status: AC

## 2019-07-17 MED FILL — Sodium Chloride IV Soln 0.9%: INTRAVENOUS | Qty: 3000 | Status: AC

## 2019-07-17 MED FILL — Heparin Sodium (Porcine) Inj 1000 Unit/ML: INTRAMUSCULAR | Qty: 10 | Status: AC

## 2019-07-17 MED FILL — Lidocaine HCl Local Preservative Free (PF) Inj 2%: INTRAMUSCULAR | Qty: 15 | Status: AC

## 2019-07-17 NOTE — Progress Notes (Signed)
Patient ID: Brittany [name], female   DOB: Mar 01, 1956, 64 y.o.   MRN: [id number] TCTS Evening Rounds:  Hemodynamically stable in sinus rhythm.  sats 100%.  Urine output ok  Chest tube 460 cc today, thin.

## 2019-07-17 NOTE — Consult Note (Signed)
Ref: Ngetich, Donalee Citrin, NP   Subjective:  Awake. She has anxiety over oxygen use as she has long history of smoking for 40 years and has nicotine withdrawal. VS stable with 100 % oxygen saturation.  Over 1/2 litre chest tube output.  Objective:  Vital Signs in the last 24 hours: Temp:  [97.4 F (36.3 C)-97.8 F (36.6 C)] 97.4 F (36.3 C) (04/28 0700) Pulse Rate:  [60-82] 76 (04/28 0700) Cardiac Rhythm: Normal sinus rhythm (04/28 0400) Resp:  [13-44] 20 (04/28 0700) BP: (85-144)/(49-69) 110/55 (04/28 0700) SpO2:  [92 %-100 %] 99 % (04/28 0740) Arterial Line BP: (138-147)/(68) 147/68 (04/27 0845) Weight:  [54.9 kg] 54.9 kg (04/28 0610)  Physical Exam: BP Readings from Last 1 Encounters:  07/17/19 (!) 110/55     Wt Readings from Last 1 Encounters:  07/17/19 54.9 kg    Weight change: 0.9 kg Body mass index is 22.14 kg/m. HEENT: [place]/AT, Eyes-Blue, PERL, EOMI, Conjunctiva-Pale, Sclera-Non-icteric Neck: No JVD, No bruit, Trachea midline. Lungs:  Clear, Bilateral. Cardiac:  Regular rhythm, normal S1 and S2, no S3. II/VI systolic murmur. Abdomen:  Soft, non-tender. BS present. Extremities:  No edema present. No cyanosis. No clubbing. CNS: AxOx3, Cranial nerves grossly intact, moves all 4 extremities.  Skin: Warm and dry.   Intake/Output from previous day: 04/27 0701 - 04/28 0700 In: 1882 [P.O.:520; I.V.:1152; IV Piggyback:200] Out: 1045 [Urine:455; Chest Tube:590]    Lab Results: BMET    Component Value Date/Time   NA 139 07/17/2019 0327   NA 138 07/16/2019 1702   NA 138 07/16/2019 0534   K 4.1 07/17/2019 0327   K 4.4 07/16/2019 1702   K 4.1 07/16/2019 0534   CL 107 07/17/2019 0327   CL 108 07/16/2019 1702   CL 109 07/16/2019 0534   CO2 22 07/17/2019 0327   CO2 22 07/16/2019 1702   CO2 23 07/16/2019 0534   GLUCOSE 157 (H) 07/17/2019 0327   GLUCOSE 143 (H) 07/16/2019 1702   GLUCOSE 108 (H) 07/16/2019 0534   BUN 24 (H) 07/17/2019 0327   BUN 22 07/16/2019 1702   BUN 15 07/16/2019 0534   CREATININE 0.70 07/17/2019 0327   CREATININE 0.70 07/16/2019 1702   CREATININE 0.56 07/16/2019 0534   CREATININE 0.68 09/20/2017 1030   CREATININE 0.73 07/18/2017 1414   CREATININE 0.60 03/08/2017 1231   CALCIUM 7.9 (L) 07/17/2019 0327   CALCIUM 8.0 (L) 07/16/2019 1702   CALCIUM 7.9 (L) 07/16/2019 0534   GFRNONAA >60 07/17/2019 0327   GFRNONAA >60 07/16/2019 1702   GFRNONAA >60 07/16/2019 0534   GFRNONAA 96 09/20/2017 1030   GFRNONAA 100 03/08/2017 1231   GFRNONAA >90 01/18/2011 1053   GFRAA >60 07/17/2019 0327   GFRAA >60 07/16/2019 1702   GFRAA >60 07/16/2019 0534   GFRAA 111 09/20/2017 1030   GFRAA 116 03/08/2017 1231   GFRAA >90 01/18/2011 1053   CBC    Component Value Date/Time   WBC 10.9 (H) 07/17/2019 0327   RBC 3.28 (L) 07/17/2019 0327   HGB 8.4 (L) 07/17/2019 0327   HCT 27.5 (L) 07/17/2019 0327   PLT 253 07/17/2019 0327   MCV 83.8 07/17/2019 0327   MCH 25.6 (L) 07/17/2019 0327   MCHC 30.5 07/17/2019 0327   RDW 20.4 (H) 07/17/2019 0327   LYMPHSABS 0.4 (L) 07/15/2019 2149   MONOABS 0.5 07/15/2019 2149   EOSABS 0.0 07/15/2019 2149   BASOSABS 0.0 07/15/2019 2149   HEPATIC Function Panel Recent Labs    01/05/19 2318  05/28/19 2143 07/15/19 2149  PROT 5.2* 6.9 4.9*   HEMOGLOBIN A1C No components found for: HGA1C,  MPG CARDIAC ENZYMES Lab Results  Component Value Date   CKTOTAL 38 09/20/2017   TROPONINI <0.03 11/28/2016   TROPONINI <0.30 07/11/2013   TROPONINI <0.30 02/28/2013   BNP No results for input(s): PROBNP in the last 8760 hours. TSH No results for input(s): TSH in the last 8760 hours. CHOLESTEROL Recent Labs    01/06/19 0628 05/29/19 0440  CHOL 127 102    Scheduled Meds: . sodium chloride   Intravenous Once  . acetaminophen  1,000 mg Oral Q6H   Or  . acetaminophen (TYLENOL) oral liquid 160 mg/5 mL  1,000 mg Per Tube Q6H  . albuterol  2.5 mg Inhalation Q6H  . aspirin EC  81 mg Oral Daily  . atorvastatin  10  mg Oral q1800  . bisacodyl  10 mg Oral Daily   Or  . bisacodyl  10 mg Rectal Daily  . Chlorhexidine Gluconate Cloth  6 each Topical Daily  . citalopram  20 mg Oral Daily  . docusate sodium  200 mg Oral Daily  . gabapentin  600 mg Oral TID  . ketorolac  7.5 mg Intravenous Q8H  . mouth rinse  15 mL Mouth Rinse BID  . metoprolol tartrate  12.5 mg Oral BID   Or  . metoprolol tartrate  12.5 mg Per Tube BID  . nicotine  14 mg Transdermal Daily  . pantoprazole  40 mg Oral Daily  . sodium chloride flush  10-40 mL Intracatheter Q12H  . sodium chloride flush  3 mL Intravenous Q12H   Continuous Infusions: . sodium chloride Stopped (07/16/19 0756)  . sodium chloride    . sodium chloride 10 mL/hr at 07/15/19 1518  . ceFEPime (MAXIPIME) IV 2 g (07/17/19 0714)  . insulin Stopped (07/16/19 0805)  . lactated ringers    . lactated ringers Stopped (07/16/19 1436)  . lactated ringers    . 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   LOS: 6 days   Time spent including chart review, lab review, examination, discussion with patient and nurse : 30 min   Dixie Dials  MD  07/17/2019, 8:15 AM

## 2019-07-17 NOTE — Progress Notes (Addendum)
Patient transferred via bed from 2 heart. Assisted to bed in room while using sternal precautions. CCMD called for telemetry admit to room. Patient alert and oriented x 4. Mediastinal and 2 pleural CTs noted. Marked at 1700. Hooked to -20 suction. Skin intact to buttocks and sacrum. Resp even and unlabored.  Foley intact and draining clear yellow urine. Dressing to chest dry and intact. Patient on nectar thickened liquids. Takes meds whole without difficulty. Call bed within reach. Possessions placed close to patient. Oriented to room and staff. Education done on incentive spirometry. Patient states understanding and teach back.  2100- Patient's b/p and resp fired yellow for mews scoring. Per transport RN from 2 heart, this patient's baseline is soft b/p. Patient alert and oriented. No distress at this time. C/o pain to surgical chest incision but was given oxy prior to transport from unit. Will continue to monitor per unit routine.   2200- b/p checked prior to administration of metoprolol.   2300- mews fired yellow because of patient's systolic pressure. Patient is currently resting and her baseline b/ps are soft as charted earlier. Q 4 hours routine.

## 2019-07-17 NOTE — Progress Notes (Addendum)
  Progress Note    07/17/2019 8:08 AM 2 Days Post-Op  Subjective:  Denies rest pain BLE   Vitals:   07/17/19 0700 07/17/19 0740  BP: (!) 110/55   Pulse: 76   Resp: 20   Temp: (!) 97.4 F (36.3 C)   SpO2: 99% 99%   Physical Exam: Lungs:  Non labored Extremities:  Palpable DP pulses bilaterally Abdomen:  Soft, NT to deep palpation Neurologic: A&O  CBC    Component Value Date/Time   WBC 10.9 (H) 07/17/2019 0327   RBC 3.28 (L) 07/17/2019 0327   HGB 8.4 (L) 07/17/2019 0327   HCT 27.5 (L) 07/17/2019 0327   PLT 253 07/17/2019 0327   MCV 83.8 07/17/2019 0327   MCH 25.6 (L) 07/17/2019 0327   MCHC 30.5 07/17/2019 0327   RDW 20.4 (H) 07/17/2019 0327   LYMPHSABS 0.4 (L) 07/15/2019 2149   MONOABS 0.5 07/15/2019 2149   EOSABS 0.0 07/15/2019 2149   BASOSABS 0.0 07/15/2019 2149    BMET    Component Value Date/Time   NA 139 07/17/2019 0327   K 4.1 07/17/2019 0327   CL 107 07/17/2019 0327   CO2 22 07/17/2019 0327   GLUCOSE 157 (H) 07/17/2019 0327   BUN 24 (H) 07/17/2019 0327   CREATININE 0.70 07/17/2019 0327   CREATININE 0.68 09/20/2017 1030   CALCIUM 7.9 (L) 07/17/2019 0327   GFRNONAA >60 07/17/2019 0327   GFRNONAA 96 09/20/2017 1030   GFRAA >60 07/17/2019 0327   GFRAA 111 09/20/2017 1030    INR    Component Value Date/Time   INR 1.5 (H) 07/15/2019 1518     Intake/Output Summary (Last 24 hours) at 07/17/2019 5852 Last data filed at 07/17/2019 0756 Gross per 24 hour  Intake 2221.97 ml  Output 1045 ml  Net 1176.97 ml     Assessment/Plan:  64 y.o. female is s/p antegrade thoracic stent graft in descending thoracic aorta for treatment of distal pseudoaneurysm in conjunction with CT surgery for total aortic arch replacement 2 Days Post-Op   BLE well perfused with palpable and symmetrical DP pulses Vascular will continue to follow   Emilie Rutter, PA-C [facility] [phone] 07/17/2019 8:08 AM  I have seen and evaluated the patient.  I agree with the PA note as documented above. POD#2 s/p antegrade TEVAR for treatment of distal pseudoaneurysm at distal margin of existing thoracic stent.  OOB this am.  Right PT and left DP palpable.  Feet warm.  Seems to be doing well.   Cephus Shelling, MD [facility] [place] Office: [phone]

## 2019-07-17 NOTE — Progress Notes (Addendum)
  Speech Language Pathology Treatment: Dysphagia  Patient Details Name: Brittany [name] MRN: [id number] DOB: April 15, 1955 Today's Date: 07/17/2019 Time: 2671-2458 SLP Time Calculation (min) (ACUTE ONLY): 20 min  Assessment / Plan / Recommendation Clinical Impression  Pt seen at bedside for skilled ST intervention targeting goals for diet tolerance and education. Pt was awake, and cooperative with unfamiliar therapist. She indicated chest discomfort as a 12/10 (RN aware). Pt was observed drinking nectar thick liquids via straw, and self feeding puree pineapple. No obvious oral issues were noted, and no overt s/s aspiration on either consistency. Pt was receptive to education regarding use of an unbent straw to faciitate chin tuck position when drinking nectar thick liquids. Pt indicated she felt her voice is stronger today. Breathy, but not aphonic.  Shortly after taking a few bites, pt indicated a sudden increase in chest pain. RN was summoned to the room to assess pt. Pt was left in bed with RN present.  Safe swallow precautions posted at [facility]. SLP will continue to follow to assess diet tolerance, readiness to advance, and appropriateness for laryngeal closure exercises when ok with surgeon.   HPI HPI: Brittany [name]  64 y.o. female who underwent TOTAL AORTIC ARCH REPLACEMENT secondary to AORTIC PSEUDOANEURYSM, s/p TEVAR. Risk of recurrent laryngeal nerven injury.       SLP Plan  Continue with current plan of care       Recommendations  Diet recommendations: Dysphagia 1 (puree);Nectar-thick liquid Liquids provided via: Straw Medication Administration: Whole meds with puree Supervision: Patient able to self feed;Intermittent supervision to cue for compensatory strategies Compensations: Chin tuck;Slow rate Postural Changes and/or Swallow Maneuvers: Seated upright 90 degrees                Oral Care Recommendations: Oral care BID Follow up Recommendations: Skilled Nursing facility SLP  Visit Diagnosis: Dysphagia, pharyngeal phase (R13.13) Plan: Continue with current plan of care       GO              Brittany [name] B. Murvin Natal, [facility], CCC-SLP Speech Language Pathologist Office: [phone] Pager: [phone]  Brittany [name] 07/17/2019, 12:45 PM

## 2019-07-17 NOTE — Evaluation (Signed)
Occupational Therapy Evaluation Patient Details Name: Brittany [name] MRN: [id number] DOB: Jun 19, 1955 Today's Date: 07/17/2019    History of Present Illness Brittany [name] is a 64 yo F with Hx of Thoracic Aortic Aneurysm (s/p repair/stenting Feb 2021), HTN, Substance Use, CVA, GERD, COPD, Anxiety/Depresion, Hep C, and Neuropathy who presents with 3 days of worsening chest pain.  CTA was performed, found to have pseudoaneurysms at distal and proximal ends of graft. She is now s/p TOTAL AORTIC ARCH REPLACEMENT and Thoracic Aortic Endovascular Repair of Distal Thoracic Pseudoaneurysm.   Clinical Impression   PTA pt living with roommate, independent for BADL. At time of eval, pt presenting with chest pain, decreased activity tolerance and difficulty maintaining sternal precautions. Pt completed bed mobility at mod A to support trunk, and sit <> stands at min A to maintain sternal precautions. Pt then completed functional mobility to sink, where she washed her hair with a shampoo cap and brushed it. Max verbal and tactile cues needed to maintain sternal precautions throughout activity. Pt limited by chest pain this date, wanting to return to bed for pain medicine to reach effectiveness that RN administered during session. Recommend HHOT at d/c to continue to progress safe BADL and sternal precautions in home environment. Will continue to follow per POC listed below.     Follow Up Recommendations  Home health OT;Supervision/Assistance - 24 hour    Equipment Recommendations  None recommended by OT    Recommendations for Other Services       Precautions / Restrictions Precautions Precautions: Fall;Sternal Precaution Comments: reviewed in context of BADL, continuing to need max VC's to follow      Mobility Bed Mobility Overal bed mobility: Needs Assistance Bed Mobility: Sit to Supine       Sit to supine: Mod assist   General bed mobility comments: cues for keeping pillow at her chest and  assist for lines, lifting feet  Transfers Overall transfer level: Needs assistance Equipment used: None Transfers: Sit to/from Stand Sit to Stand: Min assist         General transfer comment: cues for technique and assist for up from edge of chair    Balance Overall balance assessment: Needs assistance Sitting-balance support: Feet supported Sitting balance-Leahy Scale: Fair     Standing balance support: No upper extremity supported Standing balance-Leahy Scale: Fair                             ADL either performed or assessed with clinical judgement   ADL Overall ADL's : Needs assistance/impaired Eating/Feeding: Set up;Sitting   Grooming: Set up;Sitting;Adhering to UE precautions;Cueing for UE precautions;Brushing hair Grooming Details (indicate cue type and reason): cues to adhere to sternal precautions while washing and brushing hair Upper Body Bathing: Minimal assistance;Sitting;Adhering to UE precautions;Cueing for UE precautions;Cueing for safety   Lower Body Bathing: Minimal assistance;Sit to/from stand;Sitting/lateral leans;Cueing for safety   Upper Body Dressing : Minimal assistance;Cueing for UE precautions;Adhering to UE precautions;Cueing for safety   Lower Body Dressing: Minimal assistance;Sit to/from stand;Sitting/lateral leans;Cueing for safety   Toilet Transfer: Min guard;Ambulation;BSC   Toileting- Clothing Manipulation and Hygiene: Minimal assistance Toileting - Clothing Manipulation Details (indicate cue type and reason): cues for sternal precautions Tub/ Shower Transfer: Min guard;Ambulation;Shower seat;Cueing for safety   Functional mobility during ADLs: Min guard;Cueing for safety       Vision Patient Visual Report: No change from baseline       Perception  Praxis      Pertinent Vitals/Pain Pain Assessment: Faces Faces Pain Scale: Hurts worst Pain Location: chest- informed RN Pain Descriptors / Indicators:  Constant;Spasm Pain Intervention(s): Limited activity within patient's tolerance;Monitored during session;RN gave pain meds during session;Repositioned     Hand Dominance     Extremity/Trunk Assessment Upper Extremity Assessment Upper Extremity Assessment: (WFL, not able to fully assess due to sternal prec)   Lower Extremity Assessment Lower Extremity Assessment: Defer to PT evaluation       Communication Communication Communication: No difficulties   Cognition Arousal/Alertness: Awake/alert Behavior During Therapy: Anxious Overall Cognitive Status: No family/caregiver present to determine baseline cognitive functioning Area of Impairment: Memory;Safety/judgement                     Memory: Decreased short-term memory;Decreased recall of precautions   Safety/Judgement: Decreased awareness of safety;Decreased awareness of deficits     General Comments: difficulty recalling precautions multiple times throughout session   General Comments       Exercises     Shoulder Instructions      Home Living Family/patient expects to be discharged to:: Private residence Living Arrangements: Non-relatives/Friends Available Help at Discharge: Available PRN/intermittently Type of Home: House Home Access: Stairs to enter [employer] of Steps: 10 Entrance Stairs-Rails: Left Home Layout: Two level;Able to live on main level with bedroom/bathroom     Bathroom Shower/Tub: Teacher, early years/pre: Standard     Home Equipment: Environmental consultant - 2 wheels;Bedside commode   Additional Comments: reports L anke fx and R ankle sprain within past 6 months, has a dog she walks in the yard with lots of holes      Prior Functioning/Environment Level of Independence: Independent                 OT Problem List: Decreased knowledge of use of DME or AE;Decreased knowledge of precautions;Decreased activity tolerance;Cardiopulmonary status limiting activity;Impaired  UE functional use;Impaired balance (sitting and/or standing);Pain      OT Treatment/Interventions: Self-care/ADL training;Therapeutic exercise;Patient/family education;Balance training;Energy conservation;Therapeutic activities;DME and/or AE instruction    OT Goals(Current goals can be found in the care plan section) Acute Rehab OT Goals Patient Stated Goal: to go home, walk the dog OT Goal Formulation: With patient Time For Goal Achievement: 07/31/19 Potential to Achieve Goals: Good  OT Frequency: Min 2X/week   Barriers to D/C:            Co-evaluation              AM-PAC OT "6 Clicks" Daily Activity     Outcome Measure Help from another person eating meals?: A Little Help from another person taking care of personal grooming?: A Little Help from another person toileting, which includes using toliet, bedpan, or urinal?: A Little Help from another person bathing (including washing, rinsing, drying)?: A Little Help from another person to put on and taking off regular upper body clothing?: A Little Help from another person to put on and taking off regular lower body clothing?: A Little 6 Click Score: 18   End of Session Nurse Communication: Mobility status  Activity Tolerance: Patient tolerated treatment well Patient left: in bed  OT Visit Diagnosis: Unsteadiness on feet (R26.81);Other abnormalities of gait and mobility (R26.89);Pain Pain - part of body: (chest)                Time: 6010-9323 OT Time Calculation (min): 40 min Charges:  OT General Charges $OT Visit: 1 Visit OT  Evaluation $OT Eval Moderate Complexity: 1 Mod OT Treatments $Self Care/Home Management : 23-37 mins  Dalphine Handing, MSOT, OTR/L Acute Rehabilitation Services [facility] Office Number: [phone] Pager: [phone]  Dalphine Handing 07/17/2019, 5:46 PM

## 2019-07-17 NOTE — Plan of Care (Signed)
  Problem: Education: Goal: Knowledge of General Education information will improve Description: Including pain rating scale, medication(s)/side effects and non-pharmacologic comfort measures Outcome: Progressing   Problem: Clinical Measurements: Goal: Ability to maintain clinical measurements within normal limits will improve Outcome: Progressing Goal: Will remain free from infection Outcome: Progressing Goal: Respiratory complications will improve Outcome: Progressing Goal: Cardiovascular complication will be avoided Outcome: Progressing   Problem: Nutrition: Goal: Adequate nutrition will be maintained Outcome: Progressing   Problem: Coping: Goal: Level of anxiety will decrease Outcome: Progressing   Problem: Pain Managment: Goal: General experience of comfort will improve Outcome: Progressing   Problem: Safety: Goal: Ability to remain free from injury will improve Outcome: Progressing   Problem: Skin Integrity: Goal: Risk for impaired skin integrity will decrease Outcome: Progressing   Problem: Cardiac: Goal: Will achieve and/or maintain hemodynamic stability Outcome: Progressing   Problem: Respiratory: Goal: Respiratory status will improve Outcome: Progressing

## 2019-07-17 NOTE — Evaluation (Signed)
Physical Therapy Evaluation Patient Details Name: Brittany [name] MRN: [id number] DOB: Nov 07, 1955 Today's Date: 07/17/2019   History of Present Illness  Brittany [name] is a 64 yo F with Hx of Thoracic Aortic Aneurysm (s/p repair/stenting Feb 2021), HTN, Substance Use, CVA, GERD, COPD, Anxiety/Depresion, Hep C, and Neuropathy who presents with 3 days of worsening chest pain.  CTA was performed, found to have pseudoaneurysms at distal and proximal ends of graft. She is now s/p TOTAL AORTIC ARCH REPLACEMENT and Thoracic Aortic Endovascular Repair of Distal Thoracic Pseudoaneurysm.  Clinical Impression  Patient presents with decreased mobility due to generalized weakness, decreased balance, decreased activity tolerance with pain in surgical site, decreased knowledge of precautions and poor safety awareness.  Given the mechanism of injury and the fact that she could not remember not to reach back during session despite cues and pain when she did I am concerned that she will overdo it at home.  However, she will likely be too high level for CIR and is refusing SNF placement.  PT to follow acutely.  Right now recommend HHPT at d/c.     Follow Up Recommendations Home health PT;Supervision - Intermittent    Equipment Recommendations  Cane;Other (comment)(shower seat)    Recommendations for Other Services       Precautions / Restrictions Precautions Precautions: Fall;Sternal Precaution Comments: educated in sternal precautions which she needs frequent reminders to follow      Mobility  Bed Mobility Overal bed mobility: Needs Assistance Bed Mobility: Sit to Supine       Sit to supine: Mod assist   General bed mobility comments: cues for keeping pillow at her chest and assist for lines, lifting feet  Transfers Overall transfer level: Needs assistance Equipment used: None Transfers: Sit to/from Stand Sit to Stand: Min assist         General transfer comment: cues for technique and assist  for up from edge of chair  Ambulation/Gait Ambulation/Gait assistance: Min assist Gait Distance (Feet): 150 Feet Assistive device: 4-wheeled walker Gait Pattern/deviations: Step-through pattern;Trunk flexed     General Gait Details: Brittany [name] walker with assist for guidance and cues for posture, stopping to rest in middle of the hall  Stairs            Wheelchair Mobility    Modified Rankin (Stroke Patients Only)       Balance Overall balance assessment: Needs assistance Sitting-balance support: Feet supported Sitting balance-Leahy Scale: Fair     Standing balance support: No upper extremity supported Standing balance-Leahy Scale: Fair Standing balance comment: S standing at bedside waiting for me to move lines and tubes prior to sitting on EOB after ambulation                             Pertinent Vitals/Pain Pain Assessment: Faces Pain Score: 8  Faces Pain Scale: Hurts worst Pain Location: chest - 12/10, then increased to 15/10 - RN called to room Pain Descriptors / Indicators: Constant;Discomfort;Operative site guarding Pain Intervention(s): Limited activity within patient's tolerance;Monitored during session;Patient requesting pain meds-RN notified;Repositioned    Home Living Family/patient expects to be discharged to:: Private residence Living Arrangements: Non-relatives/Friends(roomate)   Type of Home: House Home Access: Stairs to enter Entrance Stairs-Rails: Left Entrance Stairs-Number of Steps: 10 Home Layout: Two level;Able to live on main level with bedroom/bathroom Home Equipment: Dan Humphreys - 2 wheels;Bedside commode Additional Comments: reports L anke fx and R ankle sprain within past 6 months, has  a dog she walks in the yard with lots of holes    Prior Function                 Hand Dominance        Extremity/Trunk Assessment   Upper Extremity Assessment Upper Extremity Assessment: RUE deficits/detail;LUE deficits/detail RUE  Deficits / Details: moves WFL but educated on sternal precautions LUE Deficits / Details: moves WFL but educated on sternal precautions    Lower Extremity Assessment Lower Extremity Assessment: RLE deficits/detail;LLE deficits/detail RLE Deficits / Details: generalized weakness, reports recent R ankle sprain which still swells and is numb RLE Sensation: decreased light touch LLE Deficits / Details: generalized weakness    Cervical / Trunk Assessment Cervical / Trunk Assessment: Kyphotic  Communication   Communication: No difficulties  Cognition Arousal/Alertness: Awake/alert Behavior During Therapy: WFL for tasks assessed/performed Overall Cognitive Status: No family/caregiver present to determine baseline cognitive functioning Area of Impairment: Memory;Safety/judgement                     Memory: Decreased short-term memory;Decreased recall of precautions   Safety/Judgement: Decreased awareness of safety;Decreased awareness of deficits     General Comments: difficult to remember precautions reaching back even after I just counselled her not to      General Comments General comments (skin integrity, edema, etc.): VSS with ambulation on 2L O2    Exercises     Assessment/Plan    PT Assessment Patient needs continued PT services  PT Problem List Decreased strength;Decreased activity tolerance;Decreased mobility;Cardiopulmonary status limiting activity;Pain;Decreased knowledge of use of DME;Decreased balance       PT Treatment Interventions DME instruction;Stair training;Therapeutic activities;Balance training;Therapeutic exercise;Functional mobility training;Gait training;Patient/family education    PT Goals (Current goals can be found in the Care Plan section)  Acute Rehab PT Goals Patient Stated Goal: to go home, walk the dog PT Goal Formulation: With patient Time For Goal Achievement: 07/31/19 Potential to Achieve Goals: Good    Frequency Min 3X/week    Barriers to discharge        Co-evaluation               AM-PAC PT "6 Clicks" Mobility  Outcome Measure Help needed turning from your back to your side while in a flat bed without using bedrails?: A Little Help needed moving from lying on your back to sitting on the side of a flat bed without using bedrails?: A Little Help needed moving to and from a bed to a chair (including a wheelchair)?: A Little Help needed standing up from a chair using your arms (e.g., wheelchair or bedside chair)?: A Little Help needed to walk in hospital room?: A Little Help needed climbing 3-5 steps with a railing? : A Lot 6 Click Score: 17    End of Session Equipment Utilized During Treatment: Oxygen Activity Tolerance: Patient limited by fatigue Patient left: in bed;with call bell/phone within reach Nurse Communication: Mobility status PT Visit Diagnosis: Other abnormalities of gait and mobility (R26.89);Difficulty in walking, not elsewhere classified (R26.2)    Time: 7681-1572 PT Time Calculation (min) (ACUTE ONLY): 37 min   Charges:   PT Evaluation $PT Eval Moderate Complexity: 1 Mod PT Treatments $Gait Training: 8-22 mins        Magda Kiel, [place] [facility] [phone] 07/17/2019   Reginia Naas 07/17/2019, 12:29 PM

## 2019-07-17 NOTE — Progress Notes (Signed)
Approx [id number]  RN weaned n/c to room air at 0110 and pt has remained on RA except for approx 10 during breathing treatment. Sats remained stable, no shortness of breath or anxiety during this time. Pt awake at 0400, calm, interactive, asked if she was still on her oxygen, when told that it had been weaned off she had an anxiety attack, inr RR, tearful, and requesting oxygen be turned back on despite stable O2 sats. Attempted to educate pt but she become inconsolable, O2 turned back on, continued emotional support, guided imagery and encouragement. Pt calm and agrees to try RA in the morning.

## 2019-07-18 LAB — BPAM RBC
Blood Product Expiration Date: 202105082359
Blood Product Expiration Date: 202105242359
Blood Product Expiration Date: 202105242359
Blood Product Expiration Date: 202105242359
Blood Product Expiration Date: 202105242359
Blood Product Expiration Date: 202105242359
Blood Product Expiration Date: 202105242359
Blood Product Expiration Date: 202105252359
Blood Product Expiration Date: 202105252359
Blood Product Expiration Date: 202105252359
ISSUE DATE / TIME: 202104260824
ISSUE DATE / TIME: 202104260824
ISSUE DATE / TIME: 202104260824
ISSUE DATE / TIME: 202104260824
ISSUE DATE / TIME: 202104261151
ISSUE DATE / TIME: 202104261151
ISSUE DATE / TIME: 202104261151
ISSUE DATE / TIME: 202104261151
ISSUE DATE / TIME: 202104271301
ISSUE DATE / TIME: 202104271334
Unit Type and Rh: 6200
Unit Type and Rh: 6200
Unit Type and Rh: 6200
Unit Type and Rh: 6200
Unit Type and Rh: 6200
Unit Type and Rh: 6200
Unit Type and Rh: 6200
Unit Type and Rh: 6200
Unit Type and Rh: 6200
Unit Type and Rh: 6200

## 2019-07-18 LAB — TYPE AND SCREEN
ABO/RH(D): A POS
Antibody Screen: NEGATIVE
Unit division: 0
Unit division: 0
Unit division: 0
Unit division: 0
Unit division: 0
Unit division: 0
Unit division: 0
Unit division: 0
Unit division: 0
Unit division: 0

## 2019-07-18 MED ORDER — MORPHINE SULFATE (PF) 2 MG/ML IV SOLN
2.0000 mg | INTRAVENOUS | Status: DC | PRN
  Administered 2019-07-18 – 2019-07-19 (×5): 2 mg via INTRAVENOUS
  Filled 2019-07-18 (×5): qty 1

## 2019-07-18 MED ORDER — GUAIFENESIN ER 600 MG PO TB12
600.0000 mg | ORAL_TABLET | Freq: Two times a day (BID) | ORAL | Status: DC
  Administered 2019-07-18 – 2019-07-24 (×13): 600 mg via ORAL
  Filled 2019-07-18 (×13): qty 1

## 2019-07-18 MED ORDER — FUROSEMIDE 10 MG/ML IJ SOLN
40.0000 mg | Freq: Once | INTRAMUSCULAR | Status: AC
  Administered 2019-07-18: 40 mg via INTRAVENOUS
  Filled 2019-07-18: qty 4

## 2019-07-18 MED ORDER — POTASSIUM CHLORIDE CRYS ER 20 MEQ PO TBCR
20.0000 meq | EXTENDED_RELEASE_TABLET | Freq: Every day | ORAL | Status: DC
  Administered 2019-07-18 – 2019-07-22 (×5): 20 meq via ORAL
  Filled 2019-07-18 (×5): qty 1

## 2019-07-18 NOTE — Op Note (Addendum)
CARDIOTHORACIC SURGERY OPERATIVE NOTE  Date of Procedure: 07/15/19 Preoperative Diagnosis: Pseudo-aneurysm at proximal and distal ends of previously placed TEVAR with chest pain  Postoperative Diagnosis: Same  Procedure:   Aortic arch replacement with 28 mm Hemashield Platinum graft under [facility] and reimplantation of head vessels as an Corporate treasurer; Antegrade stent deployment under [employer] Right axillary artery cannulation  Surgeon: B. Lorayne Marek, MD  Assistant: Jaclyn Prime PA-C  Anesthesia: General  Operative Findings:  Large pseudoaneurysm at the proximal aspect of the prior TEVAR prosthesis creating extensive inflammatory process in the area of the left main pulmonary artery and proximal descending aorta  Successful coverage of distal TEVAR pseudoaneurysm with antegrade stent deployment    BRIEF CLINICAL NOTE AND INDICATIONS FOR SURGERY  64 year old lady originally presented about 2 months ago with chest pain and back pain.  She was found to have a acute aneurysmal process of the mid descending aorta which was treated with TEVAR.  She is readmitted with pain and angiographic evidence of pseudoaneurysm at the proximal and distal aspect of the TEVAR.  After thorough work-up she is taken to the operating room for repair.  DETAILS OF THE OPERATIVE PROCEDURE  Preparation:  The patient is brought to the operating room on the above mentioned date and central monitoring was established by the anesthesia team including placement of Swan-Ganz catheter and radial arterial line. The patient is placed in the supine position on the operating table.  Intravenous antibiotics are administered. General endotracheal anesthesia is induced uneventfully. A Foley catheter is placed.  Baseline transesophageal echocardiogram was performed.  Findings were notable for preserved LV function and no valvular abnormalities  The patient's chest, abdomen, both groins, and both lower extremities are prepared and  draped in a sterile manner. A time out procedure is performed.   Surgical Approach:  Attention is first turned to the right infraclavicular fossa where an incision is made overlying the deltopectoral groove.  The dissection was carried down through the subcutaneous tissue with electrocautery and the pectoralis major and minor muscles are divided.  The right axillary was encountered and encircled.  5000 units of heparin were given intravenously.  An 8 mm dacryon graft was sewn end-to-side to the open axillary artery with a running suture of 5-0 Prolene.  This is de-aired and attached to the arterial limb of the cardiopulmonary bypass instrument.  Median sternotomy incision was then undertaken.  The chest was entered safely without intranonionic structures.  The pericardium is opened. The ascending aorta is mildly enlarged.  Full dose heparin is delivered intravenously and the  right atrium is cannulated for cardiopulmonary bypass.  Adequate heparinization is verified.     The entire pre-bypass portion of the operation was notable for stable hemodynamics.  Cardiopulmonary bypass was begun and immediate cooling was undertaken with a goal of 20 C.  A left ventricular vent was inserted the right superior pulmonary vein.  The aorta is circumferentially dissected and the head vessels are exposed.  The patient had been cooling for approximately 45 minutes the heart was arrested.  This was done by clamping the aorta and delivered cardioplegia down the aortic root.  When this completed circulatory arrest was performed.  Through the open proximal descending aorta antegrade deployment of a 26 mm core tag conformable stent was performed by Dr. Chestine Spore from vascular surgery.  Please see his note for additional details.  Once is completed a 28 mm dacryon graft was brought onto the field and sewn into into the residual portion  of the proximal descending aorta and incorporated the existing TEVAR prosthesis.  Once this is  completed I the head vessels were reattached to the dacryon serving as a new aortic arch with a running suture of 4-0 Prolene.  The new arch was then clamped and blood flow was restored to the body.  Should be noted that while circulatory arrest was undertaken antegrade cerebral perfusion was provided and cerebral saturations were monitored.  The proximal aspect of the dacryon graft was then sewn to the native aorta at the level of the sinotubular junction having excised the remainder of the ascending aorta.  This was done with a running suture with 4-0 Prolene.  Once flow to the body had been reestablished aggressive rewarming was undertaken.  Hotshot dose of cardioplegia was given and the aortic cross-clamp was removed.   The patient is rewarmed to 37C temperature. The patient is weaned and disconnected from cardiopulmonary bypass.  The patient's rhythm at separation from bypass was sinus tachycardia.  The patient was weaned from cardiopulmonary bypass  without any inotropic support.  Followup transesophageal echocardiogram performed after separation from bypass revealed  no changes from the preoperative exam.  The aortic and venous cannula were removed uneventfully. Protamine was administered to reverse the anticoagulation. The mediastinum and pleural space were inspected for hemostasis and irrigated with saline solution. The mediastinum and lateral pleural space were drained using fluted chest tubes placed through separate stab incisions inferiorly.  The soft tissues anterior to the aorta were reapproximated loosely. The sternum is closed with double strength sternal wire. The soft tissues anterior to the sternum were closed in multiple layers and the skin is closed with a running subcuticular skin closure.  The post-bypass portion of the operation was notable for stable rhythm and hemodynamics.     Disposition:  The patient tolerated the procedure well and is transported to the surgical intensive  care in stable condition. There are no intraoperative complications. All sponge instrument and needle counts are verified correct at completion of the operation.    Jayme Cloud, MD 07/18/2019 3:56 PM  The date of service should read 07/15/19.  Sun Wilensky Z. Orvan Seen, [place]

## 2019-07-18 NOTE — Progress Notes (Signed)
Patient started having severe chest pain when she got up to walk around with PT. The patient was bought back to the bed immediately. Patient stated she was having a heart attack and have pain radiating towards her right arm from her incision site. VS were WNL. PA was contacted immediately. EKG was obtained which showed sinus rhythm. Patient had an output of 310 ml from the chest tube during the walk. PA ordered morphine IV and was given to the patient. Patient states her pain is getting better. Patient's [place] was changed.

## 2019-07-18 NOTE — Consult Note (Signed)
Ref: Ngetich, Donalee Citrin, NP   Subjective:  Awake. Decreasing chest pain. 510 cc chest tube output.  Objective:  Vital Signs in the last 24 hours: Temp:  [97.2 F (36.2 C)-97.9 F (36.6 C)] 97.8 F (36.6 C) (04/29 0716) Pulse Rate:  [73-149] 77 (04/29 0716) Cardiac Rhythm: Normal sinus rhythm (04/29 0716) Resp:  [10-30] 19 (04/29 0716) BP: (89-135)/(46-75) 129/74 (04/29 0716) SpO2:  [98 %-100 %] 100 % (04/28 2300) Weight:  [53.7 kg] 53.7 kg (04/29 0444)  Physical Exam: BP Readings from Last 1 Encounters:  07/18/19 129/74     Wt Readings from Last 1 Encounters:  07/18/19 53.7 kg    Weight change: -1.2 kg Body mass index is 21.65 kg/m. HEENT: /AT, Eyes-Blue, PERL, EOMI, Conjunctiva-Pale, Sclera-Non-icteric Neck: No JVD, No bruit, Trachea midline. Lungs:  Clear, Bilateral. Midline scar is healing well. Cardiac:  Regular rhythm, normal S1 and S2, no S3. II/VI systolic murmur. Abdomen:  Soft, non-tender. BS present. Extremities:  No edema present. No cyanosis. No clubbing. CNS: AxOx3, Cranial nerves grossly intact, moves all 4 extremities.  Skin: Warm and dry.   Intake/Output from previous day: 04/28 0701 - 04/29 0700 In: 940 [P.O.:720; I.V.:120; IV Piggyback:100] Out: 1575 [Urine:1065; Chest Tube:510]    Lab Results: BMET    Component Value Date/Time   NA 139 07/17/2019 0327   NA 138 07/16/2019 1702   NA 138 07/16/2019 0534   K 4.1 07/17/2019 0327   K 4.4 07/16/2019 1702   K 4.1 07/16/2019 0534   CL 107 07/17/2019 0327   CL 108 07/16/2019 1702   CL 109 07/16/2019 0534   CO2 22 07/17/2019 0327   CO2 22 07/16/2019 1702   CO2 23 07/16/2019 0534   GLUCOSE 157 (H) 07/17/2019 0327   GLUCOSE 143 (H) 07/16/2019 1702   GLUCOSE 108 (H) 07/16/2019 0534   BUN 24 (H) 07/17/2019 0327   BUN 22 07/16/2019 1702   BUN 15 07/16/2019 0534   CREATININE 0.70 07/17/2019 0327   CREATININE 0.70 07/16/2019 1702   CREATININE 0.56 07/16/2019 0534   CREATININE 0.68 09/20/2017 1030    CREATININE 0.73 07/18/2017 1414   CREATININE 0.60 03/08/2017 1231   CALCIUM 7.9 (L) 07/17/2019 0327   CALCIUM 8.0 (L) 07/16/2019 1702   CALCIUM 7.9 (L) 07/16/2019 0534   GFRNONAA >60 07/17/2019 0327   GFRNONAA >60 07/16/2019 1702   GFRNONAA >60 07/16/2019 0534   GFRNONAA 96 09/20/2017 1030   GFRNONAA 100 03/08/2017 1231   GFRNONAA >90 01/18/2011 1053   GFRAA >60 07/17/2019 0327   GFRAA >60 07/16/2019 1702   GFRAA >60 07/16/2019 0534   GFRAA 111 09/20/2017 1030   GFRAA 116 03/08/2017 1231   GFRAA >90 01/18/2011 1053   CBC    Component Value Date/Time   WBC 10.9 (H) 07/17/2019 0327   RBC 3.28 (L) 07/17/2019 0327   HGB 8.4 (L) 07/17/2019 0327   HCT 27.5 (L) 07/17/2019 0327   PLT 253 07/17/2019 0327   MCV 83.8 07/17/2019 0327   MCH 25.6 (L) 07/17/2019 0327   MCHC 30.5 07/17/2019 0327   RDW 20.4 (H) 07/17/2019 0327   LYMPHSABS 0.4 (L) 07/15/2019 2149   MONOABS 0.5 07/15/2019 2149   EOSABS 0.0 07/15/2019 2149   BASOSABS 0.0 07/15/2019 2149   HEPATIC Function Panel Recent Labs    01/05/19 2318 05/28/19 2143 07/15/19 2149  PROT 5.2* 6.9 4.9*   HEMOGLOBIN A1C No components found for: HGA1C,  MPG CARDIAC ENZYMES Lab Results  Component Value Date  CKTOTAL 38 09/20/2017   TROPONINI <0.03 11/28/2016   TROPONINI <0.30 07/11/2013   TROPONINI <0.30 02/28/2013   BNP No results for input(s): PROBNP in the last 8760 hours. TSH No results for input(s): TSH in the last 8760 hours. CHOLESTEROL Recent Labs    01/06/19 0628 05/29/19 0440  CHOL 127 102    Scheduled Meds: . sodium chloride   Intravenous Once  . acetaminophen  1,000 mg Oral Q6H   Or  . acetaminophen (TYLENOL) oral liquid 160 mg/5 mL  1,000 mg Per Tube Q6H  . aspirin EC  81 mg Oral Daily  . atorvastatin  10 mg Oral q1800  . bisacodyl  10 mg Oral Daily   Or  . bisacodyl  10 mg Rectal Daily  . Chlorhexidine Gluconate Cloth  6 each Topical Daily  . citalopram  20 mg Oral Daily  . docusate sodium  200  mg Oral Daily  . furosemide  40 mg Intravenous Once  . gabapentin  600 mg Oral TID  . guaiFENesin  600 mg Oral BID  . mouth rinse  15 mL Mouth Rinse BID  . metoprolol tartrate  12.5 mg Oral BID   Or  . metoprolol tartrate  12.5 mg Per Tube BID  . nicotine  14 mg Transdermal Daily  . pantoprazole  40 mg Oral Daily  . potassium chloride  20 mEq Oral Daily  . sodium chloride flush  10-40 mL Intracatheter Q12H   Continuous Infusions: . sodium chloride Stopped (07/16/19 0756)  . insulin Stopped (07/16/19 0805)  . lactated ringers    . lactated ringers Stopped (07/16/19 1436)   PRN Meds:.sodium chloride, dextrose, ipratropium-albuterol, lactated ringers, metoprolol tartrate, ondansetron (ZOFRAN) IV, oxyCODONE, Resource ThickenUp Clear, sodium chloride flush, sodium chloride flush, traMADol  Assessment/Plan: Descending thoracic aorta stent graft Thoracic aorta pseudoaneurysm Aortic arch replacement HTN Type 2 DM Anemia of blood loss Moderate protein calorie malnutrition S/P stroke Tobacco use disorder Nicotine withdrawal Generalized anxiety disorder  Increase activity as tolerated.   LOS: 7 days   Time spent including chart review, lab review, examination, discussion with patient : 25 min   Dixie Dials  MD  07/18/2019, 8:47 AM

## 2019-07-18 NOTE — Progress Notes (Signed)
Central line in right IJ was removed per order. The patient was encouraged to stay in a neutral position without moving for 30 minutes. The catheter and the transducer were intact upon removal. Pressure was applied to the site for 10 minutes. There were no signs of bleeding. Petroleum gauge with 4*4 and tegaderm was placed on the site.

## 2019-07-18 NOTE — Plan of Care (Signed)
  Problem: Education: Goal: Knowledge of General Education information will improve Description: Including pain rating scale, medication(s)/side effects and non-pharmacologic comfort measures Outcome: Progressing   Problem: Health Behavior/Discharge Planning: Goal: Ability to manage health-related needs will improve Outcome: Progressing   Problem: Clinical Measurements: Goal: Ability to maintain clinical measurements within normal limits will improve Outcome: Progressing Goal: Will remain free from infection Outcome: Progressing Goal: Diagnostic test results will improve Outcome: Progressing Goal: Respiratory complications will improve Outcome: Progressing Goal: Cardiovascular complication will be avoided Outcome: Progressing   Problem: Activity: Goal: Risk for activity intolerance will decrease Outcome: Progressing   Problem: Nutrition: Goal: Adequate nutrition will be maintained Outcome: Progressing   Problem: Coping: Goal: Level of anxiety will decrease Outcome: Progressing   Problem: Elimination: Goal: Will not experience complications related to bowel motility Outcome: Progressing Goal: Will not experience complications related to urinary retention Outcome: Progressing   Problem: Pain Managment: Goal: General experience of comfort will improve Outcome: Progressing   Problem: Safety: Goal: Ability to remain free from injury will improve Outcome: Progressing   Problem: Skin Integrity: Goal: Risk for impaired skin integrity will decrease Outcome: Progressing   Problem: Cardiac: Goal: Will achieve and/or maintain hemodynamic stability Outcome: Progressing   Problem: Respiratory: Goal: Respiratory status will improve Outcome: Progressing   Problem: Urinary Elimination: Goal: Ability to achieve and maintain adequate renal perfusion and functioning will improve Outcome: Progressing   

## 2019-07-18 NOTE — Progress Notes (Signed)
      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 Tolerating current diet. Trying to cough up mucous.  No BM yet  Objective: Vital signs in last 24 hours: Temp:  [97.2 F (36.2 C)-97.9 F (36.6 C)] 97.2 F (36.2 C) (04/29 0352) Pulse Rate:  [73-149] 74 (04/28 2300) Cardiac Rhythm: Normal sinus rhythm (04/29 0352) Resp:  [10-30] 14 (04/29 0105) BP: (89-135)/(46-75) 90/46 (04/28 2300) SpO2:  [98 %-100 %] 100 % (04/28 2300) Weight:  [53.7 kg] 53.7 kg (04/29 0444)  Intake/Output from previous day: 04/28 0701 - 04/29 0700 In: 940 [P.O.:720; I.V.:120; IV Piggyback:100] Out: 1575 [Urine:1065; Chest Tube:510]  General appearance: alert, cooperative and no distress Heart: regular rate and rhythm Lungs: clear to auscultation bilaterally Abdomen: soft, non-tender; bowel sounds normal; no masses,  no organomegaly Extremities: extremities normal, atraumatic, no cyanosis or edema Wound: clean and dry  Lab Results: Recent Labs    07/16/19 1702 07/17/19 0327  WBC 14.5* 10.9*  HGB 8.7* 8.4*  HCT 28.3* 27.5*  PLT 268 253   BMET:  Recent Labs    07/16/19 1702 07/17/19 0327  NA 138 139  K 4.4 4.1  CL 108 107  CO2 22 22  GLUCOSE 143* 157*  BUN 22 24*  CREATININE 0.70 0.70  CALCIUM 8.0* 7.9*    PT/INR:  Recent Labs    07/15/19 1518  LABPROT 18.3*  INR 1.5*   ABG    Component Value Date/Time   PHART 7.428 07/15/2019 2212   HCO3 22.7 07/15/2019 2212   TCO2 24 07/15/2019 2212   ACIDBASEDEF 1.0 07/15/2019 2212   O2SAT 95.0 07/15/2019 2212   CBG (last 3)  Recent Labs    07/16/19  0609 07/16/19 0659 07/16/19 0808  GLUCAP 131* 118* 107*    Assessment/Plan: S/P Procedure(s) (LRB): TOTAL AORTIC ARCH REPLACEMENT USING HEMASHIELD PLATINUM GRAFT SIZE (N/A) TRANSESOPHAGEAL ECHOCARDIOGRAM (TEE) (N/A) Thoracic 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 CV- NSR, BP is low at 90- on Lopressor at 12.5 mg BID 2. Pulm- CXR looks good, no pleural effusion, no pneumothorax....output >500 ( level currently at 1750) will leave in place today, add Mucinex to aid with cough 3. Renal-creatinine stable, weight is elevated, IV lasix today will transition to oral regimen 4. Expected post operative blood loss anemia, hgb stable at 8.4 5. D/C Foley 6. D/C Central line 7. Dispo- patient stable, leave chest tubes in today, can remove EPW in AM if rhythm remains stable, d/c foley, central line   LOS: 7 days    Lowella Dandy, PA-C  07/18/2019

## 2019-07-18 NOTE — Progress Notes (Signed)
Physical Therapy Treatment Patient Details Name: Brittany [name] MRN: [id number] DOB: March 20, 1956 Today's Date: 07/18/2019    History of Present Illness Brittany [name] is a 64 yo F with Hx of Thoracic Aortic Aneurysm (s/p repair/stenting Feb 2021), HTN, Substance Use, CVA, GERD, COPD, Anxiety/Depresion, Hep C, and Neuropathy who presents with 3 days of worsening chest pain.  CTA was performed, found to have pseudoaneurysms at distal and proximal ends of graft. She is now s/p TOTAL AORTIC ARCH REPLACEMENT and Thoracic Aortic Endovascular Repair of Distal Thoracic Pseudoaneurysm.    PT Comments    Pt received in bed, agreeable to participation in therapy. Foley removed just prior to mobilizing. She required mod assist supine to sit and min assist sit to stand. Pt ambulated to bathroom with RW min assist. Pt then progressed to hallway ambulation. After ambulating into the hall, pt with c/o severe chest/rib pain. Pt assisted back into room after ambulating approx 30' with RW and min assist. Initially had pt sit in recliner. Pt very frantic, reporting 15/10 pain, stating she couldn't stay in recliner. Relaxation techniques utilized in attempt to calm pt. Pt then assisted back to bed, min assist. VSS. Pt on 3L O2 throughout session with SpO2 > 95%. RN notified and present in room at end of session.    Follow Up Recommendations  Home health PT;Supervision - Intermittent     Equipment Recommendations  Cane;Other (comment)(shower seat)    Recommendations for Other Services       Precautions / Restrictions Precautions Precautions: Sternal;Other (comment)(chest tube) Precaution Comments: reviewed sternal precautions. Pt more compliant as compared to eval. Only requiring verbal cues 50% of time.    Mobility  Bed Mobility Overal bed mobility: Needs Assistance Bed Mobility: Supine to Sit;Sit to Supine     Supine to sit: Mod assist Sit to supine: Min assist   General bed mobility comments: cues for  sequencing and precautions  Transfers Overall transfer level: Needs assistance Equipment used: Ambulation equipment used Transfers: Sit to/from Stand Sit to Stand: Min assist         General transfer comment: cues for techniques, assist to power up  Ambulation/Gait Ambulation/Gait assistance: Min assist Gait Distance (Feet): 30 Feet Assistive device: Rolling walker (2 wheeled) Gait Pattern/deviations: Step-through pattern;Decreased stride length;Trunk flexed Gait velocity: decreased Gait velocity interpretation: <1.31 ft/sec, indicative of household ambulator General Gait Details: cues for RW management and posture. Distance limited by pain.   Stairs             Wheelchair Mobility    Modified Rankin (Stroke Patients Only)       Balance Overall balance assessment: Needs assistance Sitting-balance support: Feet supported;No upper extremity supported Sitting balance-Leahy Scale: Fair     Standing balance support: During functional activity;Bilateral upper extremity supported;No upper extremity supported Standing balance-Leahy Scale: Fair Standing balance comment: static stand without AD. RW for ambulation.                            Cognition Arousal/Alertness: Awake/alert Behavior During Therapy: Anxious Overall Cognitive Status: No family/caregiver present to determine baseline cognitive functioning Area of Impairment: Memory;Safety/judgement                     Memory: Decreased short-term memory;Decreased recall of precautions   Safety/Judgement: Decreased awareness of safety;Decreased awareness of deficits            Exercises      General Comments General  comments (skin integrity, edema, etc.): Mobilized on 3L O2. SpO2 > 95%. Max HR 91.      Pertinent Vitals/Pain Pain Assessment: 0-10 Pain Score: 10-Worst pain ever Pain Location: chest/ribs- informed RN Pain Descriptors / Indicators:  Sharp;Shooting;Crying;Moaning;Grimacing;Guarding;Stabbing Pain Intervention(s): Limited activity within patient's tolerance;Repositioned;Patient requesting pain meds-RN notified;Monitored during session;Utilized relaxation techniques    Home Living                      Prior Function            PT Goals (current goals can now be found in the care plan section) Acute Rehab PT Goals Patient Stated Goal: home Progress towards PT goals: Not progressing toward goals - comment(pain)    Frequency    Min 3X/week      PT Plan Current plan remains appropriate    Co-evaluation              AM-PAC PT "6 Clicks" Mobility   Outcome Measure  Help needed turning from your back to your side while in a flat bed without using bedrails?: A Little Help needed moving from lying on your back to sitting on the side of a flat bed without using bedrails?: A Little Help needed moving to and from a bed to a chair (including a wheelchair)?: A Little Help needed standing up from a chair using your arms (e.g., wheelchair or bedside chair)?: A Little Help needed to walk in hospital room?: A Little Help needed climbing 3-5 steps with a railing? : A Lot 6 Click Score: 17    End of Session Equipment Utilized During Treatment: Oxygen;Gait belt Activity Tolerance: Patient limited by pain Patient left: in bed;with call bell/phone within reach;with nursing/sitter in room Nurse Communication: Mobility status;Patient requests pain meds PT Visit Diagnosis: Other abnormalities of gait and mobility (R26.89);Difficulty in walking, not elsewhere classified (R26.2);Pain     Time: 8676-1950 PT Time Calculation (min) (ACUTE ONLY): 28 min  Charges:  $Gait Training: 23-37 mins                     Aida Raider, [place]  Office # [phone] Pager [phone]    Ilda Foil 07/18/2019, 9:44 AM

## 2019-07-18 NOTE — Progress Notes (Addendum)
  Progress Note    07/18/2019 7:25 AM 3 Days Post-Op  Subjective:  No lower extremity pain. Still having some chest discomfort. Eating breakfast   Vitals:   07/18/19 0105 07/18/19 0352  BP:    Pulse:    Resp: 14   Temp: 97.6 F (36.4 C) (!) 97.2 F (36.2 C)  SpO2:     Physical Exam: General: appears comfortable, not in any distress Cardiac: regular Lungs: non labored Extremities: bilateral lower extremities well perfused and warm. 2+ femoral pulses bilaterally. 2+ right DP/PT, Left 2+ DP Abdomen:  Soft, non tender Neurologic: alert and oriented  CBC    Component Value Date/Time   WBC 10.9 (H) 07/17/2019 0327   RBC 3.28 (L) 07/17/2019 0327   HGB 8.4 (L) 07/17/2019 0327   HCT 27.5 (L) 07/17/2019 0327   PLT 253 07/17/2019 0327   MCV 83.8 07/17/2019 0327   MCH 25.6 (L) 07/17/2019 0327   MCHC 30.5 07/17/2019 0327   RDW 20.4 (H) 07/17/2019 0327   LYMPHSABS 0.4 (L) 07/15/2019 2149   MONOABS 0.5 07/15/2019 2149   EOSABS 0.0 07/15/2019 2149   BASOSABS 0.0 07/15/2019 2149    BMET    Component Value Date/Time   NA 139 07/17/2019 0327   K 4.1 07/17/2019 0327   CL 107 07/17/2019 0327   CO2 22 07/17/2019 0327   GLUCOSE 157 (H) 07/17/2019 0327   BUN 24 (H) 07/17/2019 0327   CREATININE 0.70 07/17/2019 0327   CREATININE 0.68 09/20/2017 1030   CALCIUM 7.9 (L) 07/17/2019 0327   GFRNONAA >60 07/17/2019 0327   GFRNONAA 96 09/20/2017 1030   GFRAA >60 07/17/2019 0327   GFRAA 111 09/20/2017 1030    INR    Component Value Date/Time   INR 1.5 (H) 07/15/2019 1518     Intake/Output Summary (Last 24 hours) at 07/18/2019 0725 Last data filed at 07/18/2019 0448 Gross per 24 hour  Intake 700 ml  Output 1575 ml  Net -875 ml     Assessment/Plan:  64 y.o. female is s/p  Antegrade thoracic stent graft in descending thoracic aorta for pseudoaneurysm in conjunction with CT surgery for total aortic arch replacement with hemashield graft 3 Days Post-Op. Overall she looks good  since extubation. Bp remains soft. Hemodynamically stable. Lower extremities well perfused with palpable right DP/PT and left DP pulses. Feet warm and without pain. Continue to mobilize.    Graceann Congress, PA-C [facility] [phone] 07/18/2019 7:25 AM   I have seen and evaluated the patient. I agree with the PA note as documented above.  Postop day 3 status post antegrade thoracic stent in the descending thoracic aorta for pseudoaneurysm.  She is doing well.  Moved to stepdown.  Has palpable pedal pulses.  Feet are warm motor sensory intact.  Overall seems to be doing well.  Cephus Shelling, MD [facility] [place] Office: [phone]

## 2019-07-19 ENCOUNTER — Inpatient Hospital Stay (HOSPITAL_COMMUNITY): Payer: Medicare HMO

## 2019-07-19 DIAGNOSIS — Z9889 Other specified postprocedural states: Secondary | ICD-10-CM

## 2019-07-19 MED ORDER — FUROSEMIDE 40 MG PO TABS
40.0000 mg | ORAL_TABLET | Freq: Every day | ORAL | Status: DC
  Administered 2019-07-19 – 2019-07-24 (×6): 40 mg via ORAL
  Filled 2019-07-19 (×6): qty 1

## 2019-07-19 NOTE — Progress Notes (Signed)
Physical Therapy Treatment Patient Details Name: Brittany [name] MRN: [id number] DOB: [birth year] Today's Date: 07/19/2019    History of Present Illness Brittany [name] is a 64 yo F with Hx of Thoracic Aortic Aneurysm (s/p repair/stenting Feb 2021), HTN, Substance Use, CVA, GERD, COPD, Anxiety/Depresion, Hep C, and Neuropathy who presents with 3 days of worsening chest pain.  CTA was performed, found to have pseudoaneurysms at distal and proximal ends of graft. She is now s/p TOTAL AORTIC ARCH REPLACEMENT and Thoracic Aortic Endovascular Repair of Distal Thoracic Pseudoaneurysm.    PT Comments    Pt with slow progress. Able to amb 8' in hallway with encouragement and reassurance.    Follow Up Recommendations  Home health PT;Supervision - Intermittent     Equipment Recommendations  Other (comment)(possible rollator)    Recommendations for Other Services       Precautions / Restrictions Precautions Precautions: Fall;Sternal Precaution Comments: reviewed sternal precautions. Pt more compliant as compared to eval. Only requiring verbal cues 50% of time. Restrictions Weight Bearing Restrictions: No    Mobility  Bed Mobility Overal bed mobility: Needs Assistance Bed Mobility: Supine to Sit;Sit to Supine     Supine to sit: Min assist;HOB elevated Sit to supine: Min guard   General bed mobility comments: Assist to elevate trunk into sitting.   Transfers Overall transfer level: Needs assistance Equipment used: 4-wheeled walker Transfers: Sit to/from Stand Sit to Stand: Min assist         General transfer comment: Assist for support. Verbal cues to use hands to knees to rise and follow sternal precautions  Ambulation/Gait Ambulation/Gait assistance: Min guard Gait Distance (Feet): 75 Feet Assistive device: 4-wheeled walker Gait Pattern/deviations: Step-through pattern;Decreased stride length;Trunk flexed Gait velocity: decreased Gait velocity interpretation: <1.31 ft/sec,  indicative of household ambulator General Gait Details: Assist for lines/safety   Stairs             Wheelchair Mobility    Modified Rankin (Stroke Patients Only)       Balance Overall balance assessment: Needs assistance Sitting-balance support: Feet supported;No upper extremity supported Sitting balance-Leahy Scale: Fair     Standing balance support: During functional activity;No upper extremity supported Standing balance-Leahy Scale: Fair Standing balance comment: static stand without AD. RW for mobility.                            Cognition Arousal/Alertness: Awake/alert Behavior During Therapy: Anxious Overall Cognitive Status: No family/caregiver present to determine baseline cognitive functioning Area of Impairment: Memory;Safety/judgement                     Memory: Decreased short-term memory;Decreased recall of precautions   Safety/Judgement: Decreased awareness of safety;Decreased awareness of deficits     General Comments: continues to have difficulty consistently following precautions. Very anxious and exacerbated reaction to pain that impacts safety      Exercises      General Comments General comments (skin integrity, edema, etc.): Amb on RA with SpO2 >94%      Pertinent Vitals/Pain Pain Assessment: Faces Pain Score: 10-Worst pain ever Faces Pain Scale: Hurts even more Pain Location: chest/ribs Pain Descriptors / Indicators: Sharp;Shooting;Grimacing;Guarding;Stabbing Pain Intervention(s): Limited activity within patient's tolerance;Monitored during session;Repositioned;Patient requesting pain meds-RN notified    Home Living                      Prior Function  PT Goals (current goals can now be found in the care plan section) Acute Rehab PT Goals Patient Stated Goal: home Progress towards PT goals: Progressing toward goals    Frequency    Min 3X/week      PT Plan Current plan remains  appropriate    Co-evaluation              AM-PAC PT "6 Clicks" Mobility   Outcome Measure  Help needed turning from your back to your side while in a flat bed without using bedrails?: A Little Help needed moving from lying on your back to sitting on the side of a flat bed without using bedrails?: A Little Help needed moving to and from a bed to a chair (including a wheelchair)?: A Little Help needed standing up from a chair using your arms (e.g., wheelchair or bedside chair)?: A Little Help needed to walk in hospital room?: A Little Help needed climbing 3-5 steps with a railing? : A Lot 6 Click Score: 17    End of Session Equipment Utilized During Treatment: Oxygen Activity Tolerance: Patient tolerated treatment well Patient left: in bed;with call bell/phone within reach Nurse Communication: Mobility status;Patient requests pain meds PT Visit Diagnosis: Other abnormalities of gait and mobility (R26.89);Difficulty in walking, not elsewhere classified (R26.2);Pain Pain - part of body: (chest, ribs)     Time: 1530-1600 PT Time Calculation (min) (ACUTE ONLY): 30 min  Charges:  $Gait Training: 23-37 mins                     [place] Pager [phone] Office [place] 07/19/2019, 5:16 PM

## 2019-07-19 NOTE — Discharge Summary (Signed)
Physician Discharge Summary  Patient ID: Brittany [name] MRN: [id number] DOB/AGE: Jan 15, 1956 64 y.o.  Admit date: 07/11/2019 Discharge date: 07/24/2019  Admission Diagnoses:  Patient Active Problem List   Diagnosis Date Noted  . H/O aortic arch repair 07/19/2019  . Pseudoaneurysm of aorta (HCC)   . Hypertensive urgency   . Aortic aneurysm (HCC) 07/11/2019  . Hypertensive emergency   . Acute coronary syndrome (HCC) 05/28/2019  . Thoracic aortic aneurysm, ruptured (HCC) 05/11/2019  . Cerebrovascular accident (CVA) (HCC) 02/19/2019  . Lacunar infarct, acute (HCC) 01/05/2019  . Altered mental status associated with intoxication (HCC) 01/05/2019  . Hypotension 01/05/2019  . Left wrist fracture 01/05/2019  . AKI (acute kidney injury) (HCC) 01/05/2019  . Restless leg syndrome 07/10/2018  . Peripheral neuropathy 07/10/2018  . COPD (chronic obstructive pulmonary disease) (HCC) 10/05/2017  . GERD (gastroesophageal reflux disease) 10/05/2017  . Depression 10/05/2017  . Facial cellulitis-left 10/05/2017  . Diarrhea 10/05/2017  . Chronic viral hepatitis C (HCC) 06/20/2017  . Idiopathic peripheral neuropathy 05/19/2017  . Chronic low back pain without sciatica 05/19/2017  . Hyperglycemia 05/19/2017  . Chronic bronchitis (HCC) 03/08/2017  . Elevated LFTs 03/08/2017  . 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 GAD (generalized anxiety disorder) 03/08/2017  . Hepatomegaly 03/08/2017  . Acute exacerbation of chronic obstructive pulmonary disease (COPD) (HCC) 02/16/2017  . Acute respiratory failure with hypoxia (HCC)   . COPD with acute exacerbation (HCC) 01/27/2017  . CAP (community acquired pneumonia) 01/27/2017  . Tachycardia 01/27/2017  . COPD exacerbation (HCC) 01/27/2017  . Hypokalemia 01/27/2017  . Left arm numbness 11/28/2016    Class: Chronic  . Nondisplaced transverse fracture of left patella, initial encounter for closed  fracture 06/10/2016  . Malnutrition of moderate degree (HCC) 07/12/2013  . Syncope 07/11/2013  . GASTROENTERITIS 05/07/2010  . TOBACCO ABUSE 07/01/2009  . Benign essential HTN 06/24/2009  . BRONCHITIS, ACUTE 06/24/2009   Discharge Diagnoses:   Patient Active Problem List   Diagnosis Date Noted  . H/O aortic arch repair 07/19/2019  . Pseudoaneurysm of aorta (HCC)   . Hypertensive urgency   . Aortic aneurysm (HCC) 07/11/2019  . Hypertensive emergency   . Acute coronary syndrome (HCC) 05/28/2019  . Thoracic aortic aneurysm, ruptured (HCC) 05/11/2019  . Cerebrovascular accident (CVA) (HCC) 02/19/2019  . Lacunar infarct, acute (HCC) 01/05/2019  . Altered mental status associated with intoxication (HCC) 01/05/2019  . Hypotension 01/05/2019  . Left wrist fracture 01/05/2019  . AKI (acute kidney injury) (HCC) 01/05/2019  . Restless leg syndrome 07/10/2018  . Peripheral neuropathy 07/10/2018  . COPD (chronic obstructive pulmonary disease) (HCC) 10/05/2017  . GERD (gastroesophageal reflux disease) 10/05/2017  . Depression 10/05/2017  . Facial cellulitis-left 10/05/2017  . Diarrhea 10/05/2017  . Chronic viral hepatitis C (HCC) 06/20/2017  . Idiopathic peripheral neuropathy 05/19/2017  . Chronic low back pain without sciatica 05/19/2017  . Hyperglycemia 05/19/2017  . Chronic bronchitis (HCC) 03/08/2017  . Elevated LFTs 03/08/2017  . 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 GAD (generalized anxiety disorder) 03/08/2017  . Hepatomegaly 03/08/2017  . Acute exacerbation of chronic obstructive pulmonary disease (COPD) (HCC) 02/16/2017  . Acute respiratory failure with hypoxia (HCC)   . COPD with acute exacerbation (HCC) 01/27/2017  . CAP (community acquired pneumonia) 01/27/2017  . Tachycardia 01/27/2017  . COPD exacerbation (HCC) 01/27/2017  . Hypokalemia 01/27/2017  . Left arm numbness 11/28/2016    Class: Chronic  .  Nondisplaced transverse fracture of left patella, initial encounter for closed fracture 06/10/2016  . Malnutrition of moderate degree (HCC) 07/12/2013  . Syncope 07/11/2013  . GASTROENTERITIS 05/07/2010  . TOBACCO ABUSE 07/01/2009  . Benign essential HTN 06/24/2009  . BRONCHITIS, ACUTE 06/24/2009   Discharged Condition: good  History of present Illness:  Brittany [name] is a 64 yo female with history drug abuse, HTN, COPD, H/O stroke, alzheimers disease, CAD, and anxiety depression.   She presented in February of this year with complaints of chest pain.  During that time a CT scan of her chest was obtained and showed a thoracic aortic aneurysm.  She was evaluated by Dr. Edilia Boickson with vascular surgery whom performed and emergent stent graft repair.  She again presented to the ED in March with repeat symptoms of chest pain.  CT scan at that time did not show any evidence of endovascular leak.  She presented to the ED on 07/11/2019 with complaints of chest and back pain that had been occurring for the past 3 days.   CT scan was obtained and showed a proximal and distal pseudoaneurysm.  Vascular surgery was consulted however, due to proximal location of the aneurysm CT surgery was consulted for assistance.  She was admitted for blood pressure management and further workup for possible stent graft infection.     Hospital Course:  The patient was evaluated by Dr. Cliffton AstersLightfoot who felt surgical intervention would likely be required.  He discussed the case with Vasular surgery and Dr. Vickey SagesAtkins who was in agreement with surgical intervention and was agreeable to perform her surgery.  The risks and benefits of the procedure were explained to the patient and she was agreeable to proceed.  She was taken to the operating room on 07/15/2019.  She underwent Total Aortic Arch Replacement and placement of thoracic endovascular repair of distal thoracic pseudoaneurysm.  She tolerated the procedure without difficulty and was  taken to the SICU in stable condition.  She was extubated the evening of surgery.  During her stay in the SICU the patient was evaluated by SLP for possible laryngeal nerve injury due to the location of her pseudoaneurysm.  The performed a bedside evaluation which the patient exhibited evidence of aspiration and dysphonia.  It was felt a FEES would be indicated.  This showed evidence of dysphagia with increased risk of aspiration.  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  She was placed on nectar thick liquids and pureed diet.  Repeat evaluation continued to show evidence of glottic incompetence.  She progressed to regular consistency liquids.  However SLP recommended follow up with ENT.  This has been arranged.  The patient's chest tube output decreased and her chest tubes were removed on 07/21/2019.  The patient had issues with pain control which limited her mobility.  She required a lot of encouragement to get up and ambulate.  She developed increase in her blood pressure and her lopressor dose was adjusted.  She has been weaned off oxygen as tolerated.  She participates with PT/OT and home health orders have been placed.  Her incisions are healing without evidence of infection.   She is medically stable for discharge home today.     Significant Diagnostic Studies: radiology:   CT scan: 4/22  1. New pseudoaneurysms or contained ruptures just above and just below the thoracic aortic stent graft. No mediastinal hematoma. 2. Stable small pseudoaneurysms or small ulcerated plaques in the distal abdominal aorta. No dissection.  Abdominal aortic branch vessels are patent. 3. Stable emphysematous changes and pulmonary scarring. 4. No acute pulmonary findings. 5. No acute abdominal/pelvic findings, mass lesions or lymphadenopathy. 6. Emphysema and aortic atherosclerosis.  4/25:  1. Stable stenting of the distal aspect of the aortic arch and proximal to mid  portions of the descending thoracic aorta, with chronic, stable surrounding hematoma. 2. Stable, approximately 2.9 cm x 2.4 cm x 4.3 cm aneurysmal dilatation of the mid aortic arch. 3. Stable, approximately 3.5 cm x 2.6 cm x 1.6 cm aneurysmal dilatation of the descending thoracic aorta, just distal to the previously noted aortic stent. 4. Small left pleural effusion, increased in size when compared to the prior study. 5. Noninflamed diverticula within the ascending and sigmoid colon  Treatments: surgery:   Aortic arch replacement with 28 mm Hemashield Platinum graft under [employer] and reimplantation of head vessels as an [employer]; Antegrade stent deployment under [employer] Right axillary artery cannulation  Discharge Exam: Blood pressure 117/65, pulse 92, temperature 98.2 F (36.8 C), temperature source Oral, resp. rate 18, height 5\' 2"  (1.575 m), weight 48.2 kg, SpO2 93 %.  General appearance: alert, cooperative and no distress Heart: regular rate and rhythm Lungs: clear to auscultation bilaterally Abdomen: soft, non-tender; bowel sounds normal; no masses,  no organomegaly Extremities: extremities normal, atraumatic, no cyanosis or edema Wound: clean and dry   Discharge medications:   The patient has been discharged on:   1.Beta Blocker:  Yes [ X  ]                              No   [   ]                              If No, reason:  2.Ace Inhibitor/ARB: Yes [   ]                                     No  [  x  ]                                     If No, reason: labile BP  3.Statin:   Yes [ X  ]                  No  [   ]                  If No, reason:  4.Ecasa:  Yes  [ X  ]                  No   [   ]                  If No, reason:     Allergies as of 07/24/2019      Reactions   Amoxicillin Hives, Rash   Has patient had a PCN reaction causing immediate rash, facial/tongue/throat swelling, SOB or lightheadedness with hypotension: Yes, hives Has patient had a PCN  reaction causing severe rash involving mucus membranes or skin necrosis: No Has patient had a PCN reaction that required hospitalization No Has patient had a PCN reaction occurring within the last 10 years: Yes  If all of the above answers are "NO", then may proceed with Cephalosporin use.   Clindamycin/lincomycin Diarrhea, Other (See Comments)   Patient states that clindamycin causes diarrhea    Ropinirole Other (See Comments)   "Made me insane" (per the patient)   Wellbutrin [bupropion] Diarrhea, Other (See Comments)   Lightheadedness, also   Sulfamethoxazole Nausea And Vomiting, Rash   Sulfonamide Derivatives Nausea And Vomiting, Rash      Medication List    STOP taking these medications   doxycycline 100 MG tablet Commonly known as: VIBRA-TABS   HYDROcodone-acetaminophen 5-325 MG tablet Commonly known as: NORCO/VICODIN   losartan 50 MG tablet Commonly known as: COZAAR     TAKE these medications   albuterol 108 (90 Base) MCG/ACT inhaler Commonly known as: VENTOLIN HFA Inhale 2 puffs into the lungs every 6 (six) hours as needed for wheezing or shortness of breath.   alendronate 70 MG tablet Commonly known as: FOSAMAX Take with a full glass of water on an empty stomach. What changed:   how much to take  how to take this  when to take this  additional instructions   aspirin 81 MG EC tablet Take 1 tablet (81 mg total) by mouth daily.   atorvastatin 10 MG tablet Commonly known as: LIPITOR Take 1 tablet (10 mg total) by mouth daily at 6 PM.   budesonide-formoterol 160-4.5 MCG/ACT inhaler Commonly known as: SYMBICORT Inhale 2 puffs into the lungs daily as needed (for flares).   citalopram 40 MG tablet Commonly known as: CELEXA Take 0.5 tablets (20 mg total) by mouth daily for 30 days.   docusate sodium 100 MG capsule Commonly known as: COLACE Take 1 capsule (100 mg total) by mouth daily.   furosemide 40 MG tablet Commonly known as: LASIX Take 1 tablet (40  mg total) by mouth daily.   gabapentin 300 MG capsule Commonly known as: NEURONTIN Take 2 capsules (600 mg total) by mouth 2 (two) times daily. Take 600 mg by mouth in the morning and 600 mg at bedtime What changed:   how much to take  when to take this  additional instructions   guaiFENesin-dextromethorphan 100-10 MG/5ML syrup Commonly known as: ROBITUSSIN DM Take 15 mLs by mouth every 4 (four) hours as needed for cough.   metFORMIN 500 MG tablet Commonly known as: GLUCOPHAGE Take 0.5 tablets (250 mg total) by mouth daily with breakfast.   metoprolol tartrate 25 MG tablet Commonly known as: LOPRESSOR Take 0.5 tablets (12.5 mg total) by mouth 2 (two) times daily.   nicotine 21 mg/24hr patch Commonly known as: NICODERM CQ - dosed in mg/24 hours Place 1 patch (21 mg total) onto the skin daily.   nitroGLYCERIN 0.4 MG SL tablet Commonly known as: NITROSTAT Place 1 tablet (0.4 mg total) under the tongue every 5 (five) minutes x 3 doses as needed for chest pain (chest pain).   oxyCODONE 5 MG immediate release tablet Commonly known as: Oxy IR/ROXICODONE Take 1-2 tablets (5-10 mg total) by mouth every 4 (four) hours as needed for severe pain.   pantoprazole 40 MG tablet Commonly known as: PROTONIX Take 1 tablet (40 mg total) by mouth daily.   potassium chloride 10 MEQ tablet Commonly known as: KLOR-CON Take 1 tablet (10 mEq total) by mouth daily.            Durable Medical Equipment  (From admission, onward)         Start     Ordered   07/22/19 [phone]  For home use only DME Walker rolling  Once    Question Answer Comment  Walker: With 5 Inch Wheels   Patient needs a walker to treat with the following condition Physical deconditioning      07/22/19 0751   07/22/19 0752  For home use only DME Cane  Once     07/22/19 0751         Follow-up Information    Linden Dolin, MD On 08/05/2019.   Specialty: Cardiothoracic Surgery Why: Appointment is at  10:30 Contact information: [address] [place] [address] [place] [address] [phone]        Cephus Shelling, MD In 4 weeks.   Specialty: Vascular Surgery Why: Office will call you to arrange your appt (sent) Contact information: [address] [place] [address] [phone]        [facility] Follow up.   Specialty: Home Health Services Why: HHRN,HHPT, HHST Contact information: [address] [place] [address] [phone]        Christia Reading, MD Follow up on 07/30/2019.   Specialty: Otolaryngology Why: Appointment is at 4:20, please arrive about 10 minutes early for your follow up Contact information: [address] [place] [address] [phone]           Signed:  Lowella Dandy, PA-C  07/24/2019, 7:57 AM

## 2019-07-19 NOTE — Progress Notes (Signed)
Ref: Ngetich, Donalee Citrin, NP   Subjective:  Awake. Increased pain with motion. Chest tube output remains over 1/2 litre. VS stable. Monitor: Sinus rhythm.  Objective:  Vital Signs in the last 24 hours: Temp:  [97.6 F (36.4 C)-98.7 F (37.1 C)] 97.6 F (36.4 C) (04/30 0348) Pulse Rate:  [81-84] 81 (04/29 2126) Cardiac Rhythm: Normal sinus rhythm (04/30 0700) Resp:  [16-20] 16 (04/29 1609) BP: (117-146)/(68-73) 124/68 (04/29 2327) SpO2:  [99 %-100 %] 99 % (04/29 1943) Weight:  [50.4 kg] 50.4 kg (04/30 0348)  Physical Exam: BP Readings from Last 1 Encounters:  07/18/19 124/68     Wt Readings from Last 1 Encounters:  07/19/19 50.4 kg    Weight change: -3.3 kg Body mass index is 20.32 kg/m. HEENT: [place]/AT, Eyes-Blue, PERL, EOMI, Conjunctiva-Pale, Sclera-Non-icteric Neck: No JVD, No bruit, Trachea midline. Lungs:  Clear, Bilateral. Cardiac:  Regular rhythm, normal S1 and S2, no S3. II/VI systolic murmur. Abdomen:  Soft, non-tender. BS present. Extremities:  No edema present. No cyanosis. No clubbing. CNS: AxOx3, Cranial nerves grossly intact, moves all 4 extremities.  Skin: Warm and dry.   Intake/Output from previous day: 04/29 0701 - 04/30 0700 In: 240 [P.O.:240] Out: 2210 [Urine:1550; Chest Tube:660]    Lab Results: BMET    Component Value Date/Time   NA 139 07/17/2019 0327   NA 138 07/16/2019 1702   NA 138 07/16/2019 0534   K 4.1 07/17/2019 0327   K 4.4 07/16/2019 1702   K 4.1 07/16/2019 0534   CL 107 07/17/2019 0327   CL 108 07/16/2019 1702   CL 109 07/16/2019 0534   CO2 22 07/17/2019 0327   CO2 22 07/16/2019 1702   CO2 23 07/16/2019 0534   GLUCOSE 157 (H) 07/17/2019 0327   GLUCOSE 143 (H) 07/16/2019 1702   GLUCOSE 108 (H) 07/16/2019 0534   BUN 24 (H) 07/17/2019 0327   BUN 22 07/16/2019 1702   BUN 15 07/16/2019 0534   CREATININE 0.70 07/17/2019 0327   CREATININE 0.70 07/16/2019 1702   CREATININE 0.56 07/16/2019 0534   CREATININE 0.68 09/20/2017 1030   CREATININE 0.73 07/18/2017 1414   CREATININE 0.60 03/08/2017 1231   CALCIUM 7.9 (L) 07/17/2019 0327   CALCIUM 8.0 (L) 07/16/2019 1702   CALCIUM 7.9 (L) 07/16/2019 0534   GFRNONAA >60 07/17/2019 0327   GFRNONAA >60 07/16/2019 1702   GFRNONAA >60 07/16/2019 0534   GFRNONAA 96 09/20/2017 1030   GFRNONAA 100 03/08/2017 1231   GFRNONAA >90 01/18/2011 1053   GFRAA >60 07/17/2019 0327   GFRAA >60 07/16/2019 1702   GFRAA >60 07/16/2019 0534   GFRAA 111 09/20/2017 1030   GFRAA 116 03/08/2017 1231   GFRAA >90 01/18/2011 1053   CBC    Component Value Date/Time   WBC 10.9 (H) 07/17/2019 0327   RBC 3.28 (L) 07/17/2019 0327   HGB 8.4 (L) 07/17/2019 0327   HCT 27.5 (L) 07/17/2019 0327   PLT 253 07/17/2019 0327   MCV 83.8 07/17/2019 0327   MCH 25.6 (L) 07/17/2019 0327   MCHC 30.5 07/17/2019 0327   RDW 20.4 (H) 07/17/2019 0327   LYMPHSABS 0.4 (L) 07/15/2019 2149   MONOABS 0.5 07/15/2019 2149   EOSABS 0.0 07/15/2019 2149   BASOSABS 0.0 07/15/2019 2149   HEPATIC Function Panel Recent Labs    01/05/19 2318 05/28/19 2143 07/15/19 2149  PROT 5.2* 6.9 4.9*   HEMOGLOBIN A1C No components found for: HGA1C,  MPG CARDIAC ENZYMES Lab Results  Component Value Date  CKTOTAL 38 09/20/2017   TROPONINI <0.03 11/28/2016   TROPONINI <0.30 07/11/2013   TROPONINI <0.30 02/28/2013   BNP No results for input(s): PROBNP in the last 8760 hours. TSH No results for input(s): TSH in the last 8760 hours. CHOLESTEROL Recent Labs    01/06/19 0628 05/29/19 0440  CHOL 127 102    Scheduled Meds: . sodium chloride   Intravenous Once  . acetaminophen  1,000 mg Oral Q6H   Or  . acetaminophen (TYLENOL) oral liquid 160 mg/5 mL  1,000 mg Per Tube Q6H  . aspirin EC  81 mg Oral Daily  . atorvastatin  10 mg Oral q1800  . bisacodyl  10 mg Oral Daily   Or  . bisacodyl  10 mg Rectal Daily  . Chlorhexidine Gluconate Cloth  6 each Topical Daily  . citalopram  20 mg Oral Daily  . docusate sodium  200 mg  Oral Daily  . furosemide  40 mg Oral Daily  . gabapentin  600 mg Oral TID  . guaiFENesin  600 mg Oral BID  . mouth rinse  15 mL Mouth Rinse BID  . metoprolol tartrate  12.5 mg Oral BID   Or  . metoprolol tartrate  12.5 mg Per Tube BID  . nicotine  14 mg Transdermal Daily  . pantoprazole  40 mg Oral Daily  . potassium chloride  20 mEq Oral Daily  . sodium chloride flush  10-40 mL Intracatheter Q12H   Continuous Infusions: . sodium chloride Stopped (07/16/19 0756)  . insulin Stopped (07/16/19 0805)  . lactated ringers    . lactated ringers Stopped (07/16/19 1436)   PRN Meds:.sodium chloride, dextrose, ipratropium-albuterol, lactated ringers, metoprolol tartrate, ondansetron (ZOFRAN) IV, oxyCODONE, Resource ThickenUp Clear, sodium chloride flush, sodium chloride flush, traMADol  Assessment/Plan: Descending thoracic aorta stent graft Thoracic descending aorta pseudoaneurysm Aortic arch replacement Pleuritic chest pain from chest tube. Hypertension Type 2 DM Anemia of blood loss Moderate protein calorie malnutrition S/P stroke Tobacco use disorder Nicotine withdrawal Generalized anxiety disorder  Continue medical treatment.   LOS: 8 days   Time spent including chart review, lab review, examination, discussion with patient and nurse : 30 min   Dixie Dials  MD  07/19/2019, 9:00 AM

## 2019-07-19 NOTE — Progress Notes (Signed)
Patient was up with the OT and went upto the sink to brush her teeth. Patient complained of severe pain at her incision site and was brought back to the bed. BP was elevated due to the pain. OT informed the nurse. Upon arrival to the patient's room, the patient was lying on the bed comfortably with mild pain. Vitals were taken and scheduled tylenol was given. Patient was asked if she wanted to try to get up again but the patient refused.

## 2019-07-19 NOTE — Progress Notes (Signed)
[facility]  Subjective  - Alert and moving all 4 extremities.     Objective 124/68 81 97.6 F (36.4 C) (Oral) 16 99%  Intake/Output Summary (Last 24 hours) at 07/19/2019 6226 Last data filed at 07/19/2019 0400 Gross per 24 hour  Intake 240 ml  Output 2140 ml  Net -1900 ml    Palpable DP/PT B LE Right subclavian and chest incisions healing well Heart RRR Lungs clear to auscultation  Assessment/Planning: POD # 4 64 y.o. female is s/p  Antegrade thoracic stent graft in descending thoracic aorta for pseudoaneurysm in conjunction with CT surgery for total aortic arch replacement with hemashield graft  HGB 8.4 asymptomatic Moving all 4 ext. With palpable pedal pulses Mobilization encouraged.  Mosetta Pigeon 07/19/2019 7:28 AM --  Laboratory Lab Results: Recent Labs    07/16/19 1702 07/17/19 0327  WBC 14.5* 10.9*  HGB 8.7* 8.4*  HCT 28.3* 27.5*  PLT 268 253   BMET Recent Labs    07/16/19 1702 07/17/19 0327  NA 138 139  K 4.4 4.1  CL 108 107  CO2 22 22  GLUCOSE 143* 157*  BUN 22 24*  CREATININE 0.70 0.70  CALCIUM 8.0* 7.9*    COAG Lab Results  Component Value Date   INR 1.5 (H) 07/15/2019   INR 1.2 07/15/2019   INR 1.1 07/11/2019   No results found for: PTT

## 2019-07-19 NOTE — Progress Notes (Signed)
Occupational Therapy Treatment Patient Details Name: Brittany [name] MRN: [id number] DOB: 1955/09/04 Today's Date: 07/19/2019    History of present illness Mrs Madril is a 64 yo F with Hx of Thoracic Aortic Aneurysm (s/p repair/stenting Feb 2021), HTN, Substance Use, CVA, GERD, COPD, Anxiety/Depresion, Hep C, and Neuropathy who presents with 3 days of worsening chest pain.  CTA was performed, found to have pseudoaneurysms at distal and proximal ends of graft. She is now s/p TOTAL AORTIC ARCH REPLACEMENT and Thoracic Aortic Endovascular Repair of Distal Thoracic Pseudoaneurysm.   OT comments  Pt seen for OT follow up with focus on BADL mobility progression. Chest pain continues to be limiting factor in BADL advancement. Pt completed bed mobility at mod A this date to assist with trunk to maintain precautions. Pt then completed sit <> stand with min guard assist. Functional mobility completed to sink with min guard with plans to brush teeth. Pt had onset of severe chest pain, needing to sit down. BP up to 169/77. Pt immediately assisted back to bed and comfortable in supine position with min A. BP then 171/82. RN informed on current status and pt request for pain meds. D/c recs remain appropriate. Will continue to follow.   Follow Up Recommendations  Home health OT;Supervision/Assistance - 24 hour    Equipment Recommendations  None recommended by OT    Recommendations for Other Services      Precautions / Restrictions Precautions Precautions: Fall;Sternal Precaution Comments: reviewed sternal precautions. Pt more compliant as compared to eval. Only requiring verbal cues 50% of time. Restrictions Weight Bearing Restrictions: No       Mobility Bed Mobility Overal bed mobility: Needs Assistance Bed Mobility: Supine to Sit;Sit to Supine     Supine to sit: Mod assist Sit to supine: Min assist   General bed mobility comments: assist at trunk to maintain precautions  Transfers Overall  transfer level: Needs assistance Equipment used: Rolling walker (2 wheeled) Transfers: Sit to/from Stand Sit to Stand: Min guard         General transfer comment: hands on knees with momentum    Balance Overall balance assessment: Needs assistance Sitting-balance support: Feet supported;No upper extremity supported Sitting balance-Leahy Scale: Fair     Standing balance support: During functional activity;Bilateral upper extremity supported;No upper extremity supported Standing balance-Leahy Scale: Fair Standing balance comment: static stand without AD. RW for mobility.                           ADL either performed or assessed with clinical judgement   ADL Overall ADL's : Needs assistance/impaired     Grooming: Min guard;Cueing for UE precautions;Adhering to UE precautions;Sitting;Standing Grooming Details (indicate cue type and reason): pt was standing at sink, having extreme chest pain. Pt prompted to sit down and recover, was then unable to complete task due to pain limiting                 Toilet Transfer: Min guard;Ambulation;BSC           Functional mobility during ADLs: Min guard;Cueing for safety General ADL Comments: continues to be limited by severe chest pain with mobility     Vision Patient Visual Report: No change from baseline     Perception     Praxis      Cognition Arousal/Alertness: Awake/alert Behavior During Therapy: Anxious Overall Cognitive Status: No family/caregiver present to determine baseline cognitive functioning Area of Impairment: Memory;Safety/judgement  Memory: Decreased short-term memory;Decreased recall of precautions   Safety/Judgement: Decreased awareness of safety;Decreased awareness of deficits     General Comments: continues to have difficulty consistently following precautions. Very anxious and exacerbated reaction to pain that impacts safety        Exercises      Shoulder Instructions       General Comments      Pertinent Vitals/ Pain       Pain Assessment: 0-10 Pain Score: 10-Worst pain ever Pain Location: chest/ribs- informed RN Pain Descriptors / Indicators: Lambert Mody;Shooting;Crying;Moaning;Grimacing;Guarding;Stabbing Pain Intervention(s): Limited activity within patient's tolerance;Monitored during session;Repositioned;Utilized relaxation techniques;Patient requesting pain meds-RN notified  Home Living                                          Prior Functioning/Environment              Frequency  Min 2X/week        Progress Toward Goals  OT Goals(current goals can now be found in the care plan section)  Progress towards OT goals: Progressing toward goals  Acute Rehab OT Goals Patient Stated Goal: reduce chest pain OT Goal Formulation: With patient Time For Goal Achievement: 07/31/19 Potential to Achieve Goals: Good  Plan Discharge plan remains appropriate    Co-evaluation                 AM-PAC OT "6 Clicks" Daily Activity     Outcome Measure   Help from another person eating meals?: A Little Help from another person taking care of personal grooming?: A Little Help from another person toileting, which includes using toliet, bedpan, or urinal?: A Little Help from another person bathing (including washing, rinsing, drying)?: A Little Help from another person to put on and taking off regular upper body clothing?: A Little Help from another person to put on and taking off regular lower body clothing?: A Little 6 Click Score: 18    End of Session Equipment Utilized During Treatment: Rolling walker;Oxygen  OT Visit Diagnosis: Unsteadiness on feet (R26.81);Other abnormalities of gait and mobility (R26.89);Pain Pain - part of body: (chest)   Activity Tolerance Patient tolerated treatment well   Patient Left in bed;with call bell/phone within reach   Nurse Communication Mobility status         Time: 4315-4008 OT Time Calculation (min): 23 min  Charges: OT General Charges $OT Visit: 1 Visit OT Treatments $Self Care/Home Management : 23-37 mins  Dalphine Handing, MSOT, OTR/L Acute Rehabilitation Services [facility] Office Number: [phone] Pager: [phone]  Dalphine Handing 07/19/2019, 4:35 PM

## 2019-07-19 NOTE — Plan of Care (Signed)
  Problem: Clinical Measurements: Goal: Will remain free from infection Outcome: Progressing Goal: Respiratory complications will improve Outcome: Progressing Goal: Cardiovascular complication will be avoided Outcome: Progressing   Problem: Clinical Measurements: Goal: Respiratory complications will improve Outcome: Progressing   Problem: Nutrition: Goal: Adequate nutrition will be maintained Outcome: Progressing   Problem: Pain Managment: Goal: General experience of comfort will improve Outcome: Progressing

## 2019-07-19 NOTE — Progress Notes (Signed)
      [address]       [place] [id number]             [phone]      4 Days Post-Op Procedure(s) (LRB): TOTAL AORTIC ARCH REPLACEMENT USING HEMASHIELD PLATINUM GRAFT SIZE (N/A) TRANSESOPHAGEAL ECHOCARDIOGRAM (TEE) (N/A) Thoracic Aortic Endovascular Repair of Distal Thoracic Pseudoaneurysm ( Stent Graft) - Using GORE TAG Conformable Thoracic Stent Graft - Size (N/A)   Subjective:  Patient with chest discomfort when up moving around.  Explained that she will likely have pain for a few weeks, which should improve once chest tubes are removed.   Objective: Vital signs in last 24 hours: Temp:  [97.6 F (36.4 C)-98.7 F (37.1 C)] 97.6 F (36.4 C) (04/30 0348) Pulse Rate:  [81-84] 81 (04/29 2126) Cardiac Rhythm: Normal sinus rhythm (04/30 0400) Resp:  [16-20] 16 (04/29 1609) BP: (117-146)/(68-73) 124/68 (04/29 2327) SpO2:  [99 %-100 %] 99 % (04/29 1943) Weight:  [50.4 kg] 50.4 kg (04/30 0348)  Intake/Output from previous day: 04/29 0701 - 04/30 0700 In: 240 [P.O.:240] Out: 2210 [Urine:1550; Chest Tube:660]  General appearance: alert, cooperative and no distress Heart: regular rate and rhythm Lungs: clear to auscultation bilaterally Abdomen: soft, non-tender; bowel sounds normal; no masses,  no organomegaly Extremities: extremities normal, atraumatic, no cyanosis or edema Wound: clean and dry  Lab Results: Recent Labs    07/16/19 1702 07/17/19 0327  WBC 14.5* 10.9*  HGB 8.7* 8.4*  HCT 28.3* 27.5*  PLT 268 253   BMET:  Recent Labs    07/16/19 1702 07/17/19 0327  NA 138 139  K 4.4 4.1  CL 108 107  CO2 22 22  GLUCOSE 143* 157*  BUN 22 24*  CREATININE 0.70 0.70  CALCIUM 8.0* 7.9*    PT/INR: No results for input(s): LABPROT, INR in the last 72 hours. ABG    Component Value Date/Time   PHART 7.428 07/15/2019 2212   HCO3 22.7 07/15/2019 2212   TCO2 24 07/15/2019 2212   ACIDBASEDEF 1.0 07/15/2019 2212   O2SAT 95.0 07/15/2019  2212   CBG (last 3)  Recent Labs    07/16/19 0808  GLUCAP 107*    Assessment/Plan: S/P Procedure(s) (LRB): TOTAL AORTIC ARCH REPLACEMENT USING HEMASHIELD PLATINUM GRAFT SIZE (N/A) TRANSESOPHAGEAL ECHOCARDIOGRAM (TEE) (N/A) Thoracic 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 CV- NSR, BP is elevated at times- will increase Lopressor to 25 mg BID 2. Pulm- COPD, weaning oxygen as able, CT output was 600 yesterday (level at 350 on pleurovac) CXR remains stable, can remove chest tubes once output is 200 or less 3. Renal- creatinine stable, continue diuretics will transition to oral lasix today 4. Deconditioning- patient needs to ambulate more, she is working with PT/OT 5. Dispo- patient stable, EPW removed this morning, titrate BB, continue diuretics, CT remain in place today   LOS: 8 days    Lowella Dandy, PA-C  07/19/2019

## 2019-07-19 NOTE — Progress Notes (Signed)
The pacer wires were removed by the PA. Patient tolerated it well. Patient was advised to lay flat on the bed for the next 1 hour. Vitals were WNL.

## 2019-07-20 ENCOUNTER — Inpatient Hospital Stay (HOSPITAL_COMMUNITY): Payer: Medicare HMO

## 2019-07-20 NOTE — Progress Notes (Addendum)
      [address]       [place] [address]             [phone]      5 Days Post-Op Procedure(s) (LRB): TOTAL AORTIC ARCH REPLACEMENT USING HEMASHIELD PLATINUM GRAFT SIZE (N/A) TRANSESOPHAGEAL ECHOCARDIOGRAM (TEE) (N/A) Thoracic Aortic Endovascular Repair of Distal Thoracic Pseudoaneurysm ( Stent Graft) - Using GORE TAG Conformable Thoracic Stent Graft - Size (N/A) Subjective: Sharp pains in the chest occasionally. Remains on pain medication which helps.   Objective: Vital signs in last 24 hours: Temp:  [97.7 F (36.5 C)-98.1 F (36.7 C)] 98.1 F (36.7 C) (05/01 0800) Pulse Rate:  [32-93] 87 (05/01 0800) Cardiac Rhythm: Normal sinus rhythm (05/01 0943) Resp:  [12-24] 20 (05/01 0943) BP: (91-140)/(53-95) 125/57 (05/01 0943) SpO2:  [72 %-100 %] 90 % (05/01 0800)     Intake/Output from previous day: 04/30 0701 - 05/01 0700 In: 150 [P.O.:150] Out: 2850 [Urine:2500; Chest Tube:350] Intake/Output this shift: No intake/output data recorded.  General appearance: alert, cooperative and no distress Heart: regular rate and rhythm, S1, S2 normal, no murmur, click, rub or gallop Lungs: CTA bilaterally Abdomen: soft, non-tender; bowel sounds normal; no masses,  no organomegaly Extremities: extremities normal, atraumatic, no cyanosis or edema Wound: clean and dry  Lab Results: No results for input(s): WBC, HGB, HCT, PLT in the last 72 hours. BMET: No results for input(s): NA, K, CL, CO2, GLUCOSE, BUN, CREATININE, CALCIUM in the last 72 hours.  PT/INR: No results for input(s): LABPROT, INR in the last 72 hours. ABG    Component Value Date/Time   PHART 7.428 07/15/2019 2212   HCO3 22.7 07/15/2019 2212   TCO2 24 07/15/2019 2212   ACIDBASEDEF 1.0 07/15/2019 2212   O2SAT 95.0 07/15/2019 2212   CBG (last 3)  No results for input(s): GLUCAP in the last 72 hours.  Assessment/Plan: S/P Procedure(s) (LRB): TOTAL AORTIC ARCH REPLACEMENT USING  HEMASHIELD PLATINUM GRAFT SIZE (N/A) TRANSESOPHAGEAL ECHOCARDIOGRAM (TEE) (N/A) Thoracic 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 CV- NSR, BP is low at times- continue Lopressor 12.5 mg BID, EPW removed.  2. Pulm- COPD, weaning oxygen as able, CT removed. CXR stable with tiny left pleural effusion.  3. Renal- creatinine stable, continue diuretics  Continue oral lasix today 4. Deconditioning- patient needs to ambulate more, she is working with PT/OT  Plan: Chest tubes remain in place. Output is about 350cc/24 hours-50cc overnight. Will discuss with Dr. Cliffton Asters about d/c one or all chest tubes. Sharp pains in back and chest. Will try heat therapy, also continue pain medication.      LOS: 9 days    Sharlene Dory 07/20/2019  Doing well Continue ambulation and pulm toilet Will start removing tube tomorrow.  Garrie Elenes Keane Scrape

## 2019-07-20 NOTE — Progress Notes (Signed)
Subjective:  Complains of musculoskeletal and incisional chest pain.  States overall feels well.  Objective:  Vital Signs in the last 24 hours: Temp:  [97.7 F (36.5 C)-98.1 F (36.7 C)] 98.1 F (36.7 C) (05/01 0800) Pulse Rate:  [32-93] 87 (05/01 0800) Resp:  [12-24] 20 (05/01 0943) BP: (91-140)/(53-95) 125/57 (05/01 0943) SpO2:  [72 %-100 %] 90 % (05/01 0800)  Intake/Output from previous day: 04/30 0701 - 05/01 0700 In: 150 [P.O.:150] Out: 2850 [Urine:2500; Chest Tube:350] Intake/Output from this shift: No intake/output data recorded.  Physical Exam: Neck: no adenopathy, no carotid bruit, no JVD and supple, symmetrical, trachea midline Lungs: Decreased breath sounds at left base with occasional rhonchi Heart: regular rate and rhythm, S1, S2 normal and Soft systolic murmur noted no pericardial rub Abdomen: soft, non-tender; bowel sounds normal; no masses,  no organomegaly Extremities: extremities normal, atraumatic, no cyanosis or edema  Lab Results: No results for input(s): WBC, HGB, PLT in the last 72 hours. No results for input(s): NA, K, CL, CO2, GLUCOSE, BUN, CREATININE in the last 72 hours. No results for input(s): TROPONINI in the last 72 hours.  Invalid input(s): CK, MB Hepatic Function Panel No results for input(s): PROT, ALBUMIN, AST, ALT, ALKPHOS, BILITOT, BILIDIR, IBILI in the last 72 hours. No results for input(s): CHOL in the last 72 hours. No results for input(s): PROTIME in the last 72 hours.  Imaging: Imaging results have been reviewed and DG CHEST PORT 1 VIEW  Result Date: 07/19/2019 CLINICAL DATA:  Weakness, shortness of breath, history of aortic arch repair EXAM: PORTABLE CHEST 1 VIEW COMPARISON:  07/17/2019 FINDINGS: Interval removal of right-sided chest tube. Left-sided and mediastinal drainage tubes remain in position. No significant pneumothorax. Small layering left pleural effusion. Cardiomegaly with stent endograft repair of the descending thoracic  aorta. IMPRESSION: 1. Interval removal of right-sided chest tube. Left-sided and mediastinal drainage tubes remain in position. No significant pneumothorax. 2.  Small layering left pleural effusion. 3. Cardiomegaly with stent endograft repair of the descending thoracic aorta. Electronically Signed   By: Lauralyn Primes M.D.   On: 07/19/2019 09:07    Cardiac Studies:  Assessment/Plan:  Descending thoracic aorta stent graft Thoracic descending aorta pseudoaneurysm Aortic arch replacement Pleuritic chest pain from chest tube. Hypertension Type 2 DM Anemia of blood loss Moderate protein calorie malnutrition S/P stroke Tobacco use disorder Nicotine withdrawal Generalized anxiety disorder Plan Continue present management per CVTS Out of bed to chair as tolerated Increase ambulation as tolerated  LOS: 9 days    Rinaldo Cloud 07/20/2019, 9:52 AM

## 2019-07-20 NOTE — Progress Notes (Signed)
CARDIAC REHAB PHASE I   PRE:  Rate/Rhythm: 88 SR    BP: sitting 114/56    SaO2: 97 2L, 92 RA  MODE:  Ambulation: 280 ft   POST:  Rate/Rhythm: 98 SR    BP: sitting 154/75     SaO2: 96 2L  Pt moving fairly well. Able to move to EOB with min assist and walk with rollator. Steady, increased distance. Used 2L O2. To recliner after walk. Asked for pain meds but no other c/o. Encouraged more walking. She is practicing IS. [id number]  Brittany [name] CES, ACSM 07/20/2019 3:14 PM

## 2019-07-21 ENCOUNTER — Inpatient Hospital Stay (HOSPITAL_COMMUNITY): Payer: Medicare HMO

## 2019-07-21 MED ORDER — KETOROLAC TROMETHAMINE 15 MG/ML IJ SOLN
15.0000 mg | Freq: Three times a day (TID) | INTRAMUSCULAR | Status: AC | PRN
  Administered 2019-07-21 – 2019-07-22 (×3): 15 mg via INTRAVENOUS
  Filled 2019-07-21 (×3): qty 1

## 2019-07-21 MED ORDER — SUCRALFATE 1 GM/10ML PO SUSP
1.0000 g | Freq: Three times a day (TID) | ORAL | Status: DC
  Administered 2019-07-21 – 2019-07-24 (×12): 1 g via ORAL
  Filled 2019-07-21 (×12): qty 10

## 2019-07-21 NOTE — Progress Notes (Addendum)
      [address]       [place] [address]             [phone]      6 Days Post-Op Procedure(s) (LRB): TOTAL AORTIC ARCH REPLACEMENT USING HEMASHIELD PLATINUM GRAFT SIZE (N/A) TRANSESOPHAGEAL ECHOCARDIOGRAM (TEE) (N/A) Thoracic Aortic Endovascular Repair of Distal Thoracic Pseudoaneurysm ( Stent Graft) - Using GORE TAG Conformable Thoracic Stent Graft - Size (N/A) Subjective: Indigestion this morning. Still having some sharp pains.   Objective: Vital signs in last 24 hours: Temp:  [97.9 F (36.6 C)-98 F (36.7 C)] 97.9 F (36.6 C) (05/02 0737) Pulse Rate:  [65-98] 80 (05/02 0737) Cardiac Rhythm: Normal sinus rhythm (05/02 0737) Resp:  [14-26] 20 (05/02 0737) BP: (99-159)/(46-74) 103/48 (05/02 0737) SpO2:  [96 %-99 %] 98 % (05/02 0737) Weight:  [48.6 kg] 48.6 kg (05/02 0745)     Intake/Output from previous day: 05/01 0701 - 05/02 0700 In: 590 [P.O.:580; I.V.:10] Out: 2000 [Urine:1800; Chest Tube:200] Intake/Output this shift: Total I/O In: 130 [P.O.:120; I.V.:10] Out: -   General appearance: alert, cooperative and no distress Heart: regular rate and rhythm, S1, S2 normal, no murmur, click, rub or gallop Lungs: clear to auscultation bilaterally Abdomen: soft, non-tender; bowel sounds normal; no masses,  no organomegaly Extremities: extremities normal, atraumatic, no cyanosis or edema Wound: clean and dry  Lab Results: No results for input(s): WBC, HGB, HCT, PLT in the last 72 hours. BMET: No results for input(s): NA, K, CL, CO2, GLUCOSE, BUN, CREATININE, CALCIUM in the last 72 hours.  PT/INR: No results for input(s): LABPROT, INR in the last 72 hours. ABG    Component Value Date/Time   PHART 7.428 07/15/2019 2212   HCO3 22.7 07/15/2019 2212   TCO2 24 07/15/2019 2212   ACIDBASEDEF 1.0 07/15/2019 2212   O2SAT 95.0 07/15/2019 2212   CBG (last 3)  No results for input(s): GLUCAP in the last 72 hours.  Assessment/Plan: S/P  Procedure(s) (LRB): TOTAL AORTIC ARCH REPLACEMENT USING HEMASHIELD PLATINUM GRAFT SIZE (N/A) TRANSESOPHAGEAL ECHOCARDIOGRAM (TEE) (N/A) Thoracic 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 CV- NSR, BP is low at times- continue Lopressor 12.5 mg BID, EPW removed. Continue asa and statin.  2. Pulm- COPD, weaning oxygen as able,  CXR stable with tiny left pleural effusion-stable.   3. Renal- creatinine stable, continue diuretics  Continue oral lasix today 4. Deconditioning- patient needs to ambulate more, she is working with PT/OT 5. Indigestion-on PPI already. Ordered Carafate to try since she didn't want to chew medication.  6. GI- tolerating pureed foods.   Plan: Chest tubes remain in place. Output is about 200cc/24 hours. Maybe can remove mediastinal chest tube? Ordered some PRN Toradol for sharp rib pain. Last creatinine 0.70. Encouraged to ambulate and use incentive spirometer.     LOS: 10 days    Sharlene Dory 07/21/2019   Agree with above CT out today dispo planning  Griselda Tosh O Aiyanah Kalama

## 2019-07-21 NOTE — Progress Notes (Signed)
Subjective:  Denies any anginal chest pain or shortness of breath states overall feeling better today.  Objective:  Vital Signs in the last 24 hours: Temp:  [97.9 F (36.6 C)-98 F (36.7 C)] 97.9 F (36.6 C) (05/02 0737) Pulse Rate:  [65-98] 80 (05/02 0737) Resp:  [14-26] 20 (05/02 0737) BP: (99-159)/(46-74) 103/48 (05/02 0737) SpO2:  [96 %-99 %] 98 % (05/02 0737) Weight:  [48.6 kg] 48.6 kg (05/02 0745)  Intake/Output from previous day: 05/01 0701 - 05/02 0700 In: 590 [P.O.:580; I.V.:10] Out: 2000 [Urine:1800; Chest Tube:200] Intake/Output from this shift: Total I/O In: 130 [P.O.:120; I.V.:10] Out: -   Physical Exam: Neck: no adenopathy, no carotid bruit, no JVD and supple, symmetrical, trachea midline Lungs: Decreased breath sound at bases left more than right Heart: regular rate and rhythm, S1, S2 normal and Soft systolic murmur noted Abdomen: soft, non-tender; bowel sounds normal; no masses,  no organomegaly Extremities: extremities normal, atraumatic, no cyanosis or edema  Lab Results: No results for input(s): WBC, HGB, PLT in the last 72 hours. No results for input(s): NA, K, CL, CO2, GLUCOSE, BUN, CREATININE in the last 72 hours. No results for input(s): TROPONINI in the last 72 hours.  Invalid input(s): CK, MB Hepatic Function Panel No results for input(s): PROT, ALBUMIN, AST, ALT, ALKPHOS, BILITOT, BILIDIR, IBILI in the last 72 hours. No results for input(s): CHOL in the last 72 hours. No results for input(s): PROTIME in the last 72 hours.  Imaging: Imaging results have been reviewed and DG Chest Port 1 View  Result Date: 07/21/2019 CLINICAL DATA:  Chest tube, history COPD, Alzheimer's, coronary artery disease, hypertension, stroke EXAM: PORTABLE CHEST 1 VIEW COMPARISON:  Portable exam 0549 hours compared to 07/20/2019 FINDINGS: Mediastinal drain and BILATERAL thoracostomy tubes present. Upper normal heart size post median sternotomy and aortic stenting. Pulmonary  vascularity normal. LEFT pleural effusion and basilar atelectasis. Tiny LEFT pneumothorax lateral upper LEFT chest. Remaining lungs clear. Bones demineralized. IMPRESSION: Persistent LEFT pleural effusion and basilar atelectasis. Tiny LEFT pneumothorax despite thoracostomy tube. Electronically Signed   By: Ulyses Southward M.D.   On: 07/21/2019 09:05   DG Chest Port 1 View  Result Date: 07/20/2019 CLINICAL DATA:  Pleural effusion. EXAM: PORTABLE CHEST 1 VIEW COMPARISON:  07/19/2019 FINDINGS: Stent graft unchanged over the descending thoracic aorta. Left-sided chest tube as well as mediastinal drain unchanged. Lungs are adequately inflated with stable small amount left pleural fluid/atelectasis. Right lung is clear. No pneumothorax. Cardiomediastinal silhouette and remainder of the exam is unchanged. IMPRESSION: 1. Stable left base opacification likely small effusion with atelectasis. 2.  Tubes and lines as described. Electronically Signed   By: Elberta Fortis M.D.   On: 07/20/2019 16:20    Cardiac Studies:  Assessment/Plan:  Status post descending thoracic aorta stent graft postop day #6 doing well Thoracic descending aorta pseudoaneurysm Aortic arch replacement Pleuritic chest pain from chest tube. Hypertension Type 2 DM Anemia of blood loss Moderate protein calorie malnutrition S/P stroke Tobacco use disorder Nicotine withdrawal Generalized anxiety disorder Plan Continue present management per CVTS Encourage ambulation as tolerated    LOS: 10 days    Rinaldo Cloud 07/21/2019, 9:56 AM

## 2019-07-21 NOTE — Progress Notes (Signed)
      [address]       [place] [address]             [phone]       Procedure: chest tube removal  Consent obtained verbally  Pre: patient stable but nervous  Chest tubes were switched to water seal. Mediastinal chest tube was removed with pleural chest tubes clamped. Sutures were tightened with nursing assisting. Then mediastinal chest tube was clamped and both pleural chest tubes were pulled. Sutures tightened by nursing. All incisions were dressed with sterile 4 x 4 gauze and tape.   Complications: none.  Patient tolerated the procedure well. Given Toradol pre-procedure and oxycodone post procedure.   Jari Favre, PA-C

## 2019-07-21 NOTE — Plan of Care (Signed)
  Problem: Clinical Measurements: Goal: Ability to maintain clinical measurements within normal limits will improve Outcome: Progressing   Problem: Clinical Measurements: Goal: Respiratory complications will improve Outcome: Progressing   Problem: Clinical Measurements: Goal: Cardiovascular complication will be avoided Outcome: Progressing   Problem: Activity: Goal: Risk for activity intolerance will decrease Outcome: Progressing   Problem: Nutrition: Goal: Adequate nutrition will be maintained Outcome: Progressing

## 2019-07-22 ENCOUNTER — Inpatient Hospital Stay (HOSPITAL_COMMUNITY): Payer: Medicare HMO

## 2019-07-22 NOTE — Progress Notes (Signed)
Physical Therapy Treatment Patient Details Name: Brittany [name] MRN: [id number] DOB: 1955/07/27 Today's Date: 07/22/2019    History of Present Illness Brittany [name] is a 64 yo F with Hx of Thoracic Aortic Aneurysm (s/p repair/stenting Feb 2021), HTN, Substance Use, CVA, GERD, COPD, Anxiety/Depresion, Hep C, and Neuropathy who presents with 3 days of worsening chest pain.  CTA was performed, found to have pseudoaneurysms at distal and proximal ends of graft. She is now s/p TOTAL AORTIC ARCH REPLACEMENT and Thoracic Aortic Endovascular Repair of Distal Thoracic Pseudoaneurysm.    PT Comments    Pt pleasant and very willing to mobilize despite reporting she had to return breakfast because she didn't like any of it. Pt educated for all sternal precautions, walking program, progressive activity and HEp.  93/82 (87) pre gait 138/63 (86) post HR 86-92 97% on 1L however periods of monitor reading 77% without accurate pleth    Follow Up Recommendations  Home health PT;Supervision - Intermittent     Equipment Recommendations  Other (comment)(rollator)    Recommendations for Other Services       Precautions / Restrictions Precautions Precautions: Fall;Sternal Precaution Comments: reviewed all precautions with only min cues needed    Mobility  Bed Mobility Overal bed mobility: Needs Assistance Bed Mobility: Supine to Sit     Supine to sit: Min guard     General bed mobility comments: cues for sequence and hand placement without physical assist needed  Transfers Overall transfer level: Needs assistance   Transfers: Sit to/from Stand Sit to Stand: Min guard         General transfer comment: cues for hand placement  Ambulation/Gait Ambulation/Gait assistance: Min guard Gait Distance (Feet): 250 Feet Assistive device: 4-wheeled walker Gait Pattern/deviations: Step-through pattern;Decreased stride length;Trunk flexed   Gait velocity interpretation: 1.31 - 2.62 ft/sec,  indicative of limited community ambulator General Gait Details: cues for posture and direction   Stairs             Wheelchair Mobility    Modified Rankin (Stroke Patients Only)       Balance Overall balance assessment: Needs assistance   Sitting balance-Leahy Scale: Good       Standing balance-Leahy Scale: Fair Standing balance comment: pt able to stand without assist rollator for gait                            Cognition Arousal/Alertness: Awake/alert Behavior During Therapy: WFL for tasks assessed/performed Overall Cognitive Status: Impaired/Different from baseline                       Memory: Decreased recall of precautions                Exercises General Exercises - Lower Extremity Long Arc Quad: AROM;Both;Seated;20 reps Hip Flexion/Marching: AROM;Both;Seated;20 reps    General Comments        Pertinent Vitals/Pain Pain Score: 7  Pain Location: chest/ribs Pain Descriptors / Indicators: Sore Pain Intervention(s): Limited activity within patient's tolerance;Monitored during session;Repositioned    Home Living                      Prior Function            PT Goals (current goals can now be found in the care plan section) Progress towards PT goals: Progressing toward goals    Frequency    Min 3X/week      PT  Plan Current plan remains appropriate    Co-evaluation              AM-PAC PT "6 Clicks" Mobility   Outcome Measure  Help needed turning from your back to your side while in a flat bed without using bedrails?: A Little Help needed moving from lying on your back to sitting on the side of a flat bed without using bedrails?: A Little Help needed moving to and from a bed to a chair (including a wheelchair)?: A Little Help needed standing up from a chair using your arms (e.g., wheelchair or bedside chair)?: A Little Help needed to walk in hospital room?: A Little Help needed climbing 3-5  steps with a railing? : A Little 6 Click Score: 18    End of Session Equipment Utilized During Treatment: Oxygen Activity Tolerance: Patient tolerated treatment well Patient left: in chair;with call bell/phone within reach Nurse Communication: Mobility status PT Visit Diagnosis: Other abnormalities of gait and mobility (R26.89);Difficulty in walking, not elsewhere classified (R26.2);Pain     Time: 0722-0746 PT Time Calculation (min) (ACUTE ONLY): 24 min  Charges:  $Gait Training: 8-22 mins $Therapeutic Exercise: 8-22 mins                     Bronte Sabado P, PT Acute Rehabilitation Services Pager: [phone] Office: [phone]    Enedina Finner Glendell Schlottman 07/22/2019, 12:36 PM

## 2019-07-22 NOTE — TOC Transition Note (Addendum)
Transition of Care [facility] - CM/SW Discharge Note   Patient Details  Name: Brittany [name] MRN: [id number] Date of Birth: [birth year]  Transition of Care [facility]) CM/SW Contact:  Leone Haven, RN Phone Number: 07/22/2019, 1:06 PM   Clinical Narrative:    NCM spoke with patient at the bedside, offered choice ,she states [facility] will be fine, NCM made referral to [place] with [facility], she is able to take referral.  Soc will begin 24 to 48 hrs post dc.  Patient has a rolling walker at home and a bsc. She is for dc on Wed. [place] with [facility] said they will not have ST available, NCM informed patient and she states it is ok to try to see if [facility] take referral.  tried [facility] and they are able to take referral for HHRN,HHPT, HHST with soc beginning 24 to 48 hrs post dc.    Final next level of care: Home w Home Health Services Barriers to Discharge: Continued Medical Work up   Patient Goals and CMS Choice Patient states their goals for this hospitalization and ongoing recovery are:: get better CMS Medicare.gov Compare Post Acute Care list provided to:: Patient Choice offered to / list presented to : Patient  Discharge Placement                       Discharge Plan and Services                  DME Agency: NA       HH Arranged: PT,RN, ST HH Agency: [facility] Date [facility] Agency Contacted: 07/22/19 Time HH Agency Contacted: 1306 Representative spoke with at [facility] Agency: [place]  Social Determinants of Health (SDOH) Interventions     Readmission Risk Interventions Readmission Risk Prevention Plan 05/31/2019  Transportation Screening Complete  PCP or Specialist Appt within 3-5 Days Complete  HRI or Home Care Consult Complete  Social Work Consult for Recovery Care Planning/Counseling Complete  Palliative Care Screening Not Applicable  Medication Review Oceanographer) Complete  Some recent data might be hidden

## 2019-07-22 NOTE — Progress Notes (Signed)
CARDIAC REHAB PHASE I   PRE:  Rate/Rhythm: 87 SR    BP: sitting 101/62    SaO2: 100 4L, 97 1L  MODE:  Ambulation: 640 ft   POST:  Rate/Rhythm: 97 SR    BP: sitting 128/74     SaO2: 96 RA  Pt able to ambulate independently with rollator, standby. Tolerated well as she talked entire walk and only x2 short rests. No O2 needed, left off after walk. To recliner. Progressing well. Second walk today. [id number]   Harriet Masson CES, ACSM 07/22/2019 12:00 PM

## 2019-07-22 NOTE — Progress Notes (Signed)
Ref: Ngetich, Dinah C, NP   Subjective:  Exertional dyspnea without drop in oxygen saturation. VS stable.  ENT referral suggested by Swallow study. Nectar thick liquids and solids are recommended.  Objective:  Vital Signs in the last 24 hours: Temp:  [98 F (36.7 C)-98.3 F (36.8 C)] 98.2 F (36.8 C) (05/03 1652) Pulse Rate:  [83-101] 86 (05/03 1652) Cardiac Rhythm: Normal sinus rhythm (05/03 1200) Resp:  [18-20] 19 (05/03 1652) BP: (101-126)/(49-98) 117/61 (05/03 1652) SpO2:  [93 %-100 %] 97 % (05/03 1652) Weight:  [48.7 kg] 48.7 kg (05/03 0429)  Physical Exam: BP Readings from Last 1 Encounters:  07/22/19 117/61     Wt Readings from Last 1 Encounters:  07/22/19 48.7 kg    Weight change:  Body mass index is 19.64 kg/m. HEENT: [place]/AT, Eyes-Blue, PERL, EOMI, Conjunctiva-Pale, Sclera-Non-icteric Neck: No JVD, No bruit, Trachea midline. Lungs:  Clear, Bilateral. Midline scar. Cardiac:  Regular rhythm, normal S1 and S2, no S3. II/VI systolic murmur. Abdomen:  Soft, non-tender. BS present. Extremities:  No edema present. No cyanosis. No clubbing. CNS: AxOx3, Cranial nerves grossly intact, moves all 4 extremities.  Skin: Warm and dry.   Intake/Output from previous day: 05/02 0701 - 05/03 0700 In: 380 [P.O.:360; I.V.:20] Out: 990 [Urine:950; Chest Tube:40]    Lab Results: BMET    Component Value Date/Time   NA 139 07/17/2019 0327   NA 138 07/16/2019 1702   NA 138 07/16/2019 0534   K 4.1 07/17/2019 0327   K 4.4 07/16/2019 1702   K 4.1 07/16/2019 0534   CL 107 07/17/2019 0327   CL 108 07/16/2019 1702   CL 109 07/16/2019 0534   CO2 22 07/17/2019 0327   CO2 22 07/16/2019 1702   CO2 23 07/16/2019 0534   GLUCOSE 157 (H) 07/17/2019 0327   GLUCOSE 143 (H) 07/16/2019 1702   GLUCOSE 108 (H) 07/16/2019 0534   BUN 24 (H) 07/17/2019 0327   BUN 22 07/16/2019 1702   BUN 15 07/16/2019 0534   CREATININE 0.70 07/17/2019 0327   CREATININE 0.70 07/16/2019 1702   CREATININE  0.56 07/16/2019 0534   CREATININE 0.68 09/20/2017 1030   CREATININE 0.73 07/18/2017 1414   CREATININE 0.60 03/08/2017 1231   CALCIUM 7.9 (L) 07/17/2019 0327   CALCIUM 8.0 (L) 07/16/2019 1702   CALCIUM 7.9 (L) 07/16/2019 0534   GFRNONAA >60 07/17/2019 0327   GFRNONAA >60 07/16/2019 1702   GFRNONAA >60 07/16/2019 0534   GFRNONAA 96 09/20/2017 1030   GFRNONAA 100 03/08/2017 1231   GFRNONAA >90 01/18/2011 1053   GFRAA >60 07/17/2019 0327   GFRAA >60 07/16/2019 1702   GFRAA >60 07/16/2019 0534   GFRAA 111 09/20/2017 1030   GFRAA 116 03/08/2017 1231   GFRAA >90 01/18/2011 1053   CBC    Component Value Date/Time   WBC 10.9 (H) 07/17/2019 0327   RBC 3.28 (L) 07/17/2019 0327   HGB 8.4 (L) 07/17/2019 0327   HCT 27.5 (L) 07/17/2019 0327   PLT 253 07/17/2019 0327   MCV 83.8 07/17/2019 0327   MCH 25.6 (L) 07/17/2019 0327   MCHC 30.5 07/17/2019 0327   RDW 20.4 (H) 07/17/2019 0327   LYMPHSABS 0.4 (L) 07/15/2019 2149   MONOABS 0.5 07/15/2019 2149   EOSABS 0.0 07/15/2019 2149   BASOSABS 0.0 07/15/2019 2149   HEPATIC Function Panel Recent Labs    01/05/19 2318 05/28/19 2143 07/15/19 2149  PROT 5.2* 6.9 4.9*   HEMOGLOBIN A1C No components found for: HGA1C,  MPG  CARDIAC ENZYMES Lab Results  Component Value Date   CKTOTAL 38 09/20/2017   TROPONINI <0.03 11/28/2016   TROPONINI <0.30 07/11/2013   TROPONINI <0.30 02/28/2013   BNP No results for input(s): PROBNP in the last 8760 hours. TSH No results for input(s): TSH in the last 8760 hours. CHOLESTEROL Recent Labs    01/06/19 0628 05/29/19 0440  CHOL 127 102    Scheduled Meds: . sodium chloride   Intravenous Once  . aspirin EC  81 mg Oral Daily  . atorvastatin  10 mg Oral q1800  . bisacodyl  10 mg Oral Daily   Or  . bisacodyl  10 mg Rectal Daily  . Chlorhexidine Gluconate Cloth  6 each Topical Daily  . citalopram  20 mg Oral Daily  . docusate sodium  200 mg Oral Daily  . furosemide  40 mg Oral Daily  . gabapentin   600 mg Oral TID  . guaiFENesin  600 mg Oral BID  . mouth rinse  15 mL Mouth Rinse BID  . metoprolol tartrate  12.5 mg Oral BID   Or  . metoprolol tartrate  12.5 mg Per Tube BID  . nicotine  14 mg Transdermal Daily  . pantoprazole  40 mg Oral Daily  . potassium chloride  20 mEq Oral Daily  . sodium chloride flush  10-40 mL Intracatheter Q12H  . sucralfate  1 g Oral TID WC & HS   Continuous Infusions: . sodium chloride Stopped (07/16/19 0756)  . insulin Stopped (07/16/19 0805)  . lactated ringers    . 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   LOS: 11 days   Time spent including chart review, lab review, examination, discussion with patient : 30 min   Orpah Cobb  MD  07/22/2019, 6:13 PM

## 2019-07-22 NOTE — Plan of Care (Signed)
  Problem: Education: Goal: Knowledge of General Education information will improve Description: Including pain rating scale, medication(s)/side effects and non-pharmacologic comfort measures Outcome: Progressing   Problem: Health Behavior/Discharge Planning: Goal: Ability to manage health-related needs will improve Outcome: Progressing   Problem: Clinical Measurements: Goal: Ability to maintain clinical measurements within normal limits will improve Outcome: Progressing Goal: Will remain free from infection Outcome: Progressing Goal: Diagnostic test results will improve Outcome: Progressing Goal: Respiratory complications will improve Outcome: Progressing Goal: Cardiovascular complication will be avoided Outcome: Progressing   Problem: Activity: Goal: Risk for activity intolerance will decrease Outcome: Progressing   Problem: Nutrition: Goal: Adequate nutrition will be maintained Outcome: Progressing   Problem: Coping: Goal: Level of anxiety will decrease Outcome: Progressing   Problem: Elimination: Goal: Will not experience complications related to bowel motility Outcome: Progressing Goal: Will not experience complications related to urinary retention Outcome: Progressing   Problem: Pain Managment: Goal: General experience of comfort will improve Outcome: Progressing   Problem: Safety: Goal: Ability to remain free from injury will improve Outcome: Progressing   Problem: Skin Integrity: Goal: Risk for impaired skin integrity will decrease Outcome: Progressing   Problem: Cardiac: Goal: Will achieve and/or maintain hemodynamic stability Outcome: Progressing   Problem: Respiratory: Goal: Respiratory status will improve Outcome: Progressing   Problem: Urinary Elimination: Goal: Ability to achieve and maintain adequate renal perfusion and functioning will improve Outcome: Progressing

## 2019-07-22 NOTE — Progress Notes (Signed)
Modified Barium Swallow Progress Note  Patient Details  Name: Brittany [name] MRN: [id number] Date of Birth: [birth year]  Today's Date: 07/22/2019  Modified Barium Swallow completed.  Full report located under Chart Review in the Imaging Section.  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 Pt is easily able to tolerate solids and nectar thick liquids without oral dysphagia (other than missing dentition) and no penetration. However, with thin liquids, pt is able to initiate laryngeal closure prior to swallow initiation, resulting in trace frank penetration events with movement of liquid just below glottis. Pt is able to eject with an occasional throat clear (cued), though this is still dysphonic and weak. When bolus size increased, quantity of penetration increased. Pt is intuitive at this point with a supraglottic swallow; minimal verbal cueing needed, though SLP did reinforce need to constantly attend to swallowing, keep rate slow and bolus small and remember to close her throat before the swallow. 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 Reported to CTCS team.    Swallow Evaluation Recommendations   Recommended Consults: Consider ENT evaluation   SLP Diet Recommendations: Nectar thick liquid;Dysphagia 1 (Puree) solids   Liquid Administration via: Cup;Straw   Medication Administration: Crushed with puree   Supervision: Patient able to self feed   Compensations: Slow rate;Clear throat intermittently;Effortful swallow   Postural Changes: Remain semi-upright after after feeds/meals (Comment);Seated upright at 90 degrees   Oral Care  Recommendations: Oral care BID       Harlon Ditty, MA CCC-SLP  Acute Rehabilitation Services Pager [phone] Office [phone]  Taijon Vink, Riley Nearing 07/22/2019,10:37 AM

## 2019-07-22 NOTE — Progress Notes (Signed)
      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  She is tired and feels a little short of breath.  She states her chest hurts but isn't as sharp as it was prior.  She is agreeable to home health.  Objective: Vital signs in last 24 hours: Temp:  [97.7 F (36.5 C)-98.1 F (36.7 C)] 98.1 F (36.7 C) (05/03 0429) Pulse Rate:  [82-101] 83 (05/03 0429) Cardiac Rhythm: Normal sinus rhythm (05/03 0439) Resp:  [18-20] 18 (05/03 0429) BP: (105-125)/(47-98) 105/49 (05/03 0429) SpO2:  [93 %-98 %] 97 % (05/03 0429) Weight:  [48.7 kg] 48.7 kg (05/03 0429)  Intake/Output from previous day: 05/02 0701 - 05/03 0700 In: 380 [P.O.:360; I.V.:20] Out: 990 [Urine:950; Chest Tube:40]  General appearance: alert, cooperative and no distress Heart: regular rate and rhythm Lungs: clear to auscultation bilaterally Abdomen: soft, non-tender; bowel sounds normal; no masses,  no organomegaly Extremities: edema trace Wound: clean and dry  Lab Results: No results for input(s): WBC, HGB, HCT, PLT in the last 72 hours. BMET: No results for input(s): NA, K, CL, CO2, GLUCOSE, BUN, CREATININE, CALCIUM in the last 72 hours.  PT/INR: No results for input(s): LABPROT, INR in the last 72 hours. ABG    Component Value Date/Time   PHART 7.428 07/15/2019 2212   HCO3 22.7 07/15/2019 2212   TCO2 24 07/15/2019 2212   ACIDBASEDEF 1.0 07/15/2019 2212   O2SAT 95.0 07/15/2019 2212   CBG (last 3)  No results for input(s): GLUCAP in the last 72 hours.  Assessment/Plan: S/P Procedure(s) (LRB): TOTAL AORTIC ARCH  REPLACEMENT USING HEMASHIELD PLATINUM GRAFT SIZE (N/A) TRANSESOPHAGEAL ECHOCARDIOGRAM (TEE) (N/A) Thoracic 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 CV- NSR, BP is controlled- continue Lopressor 2. Pulm- no acute issues, short of breath at times, sats >90%, will wean off 1L as able 3. Renal- weight is below baseline, will stop lasix/potassium 4. Dysphagia- will have SLP re-evaluate for improvement of swallowing, function.. if no improvement will refer to ENT on outpatient basis 5. Deconditioning- PT/OT, will place h/h orders 6. Dispo- patient stable, making slow but steady progress, continue PT/OT.Marland Kitchen explained to patient that she will not be 100% pain free, however her pain will subside the further along in her recovery she gets, she understands this, will plan for d/c home on Wednesday if she remains stable   LOS: 11 days    Brittany Dandy, Brittany [name] 07/22/2019

## 2019-07-22 NOTE — Progress Notes (Signed)
  Progress Note    07/22/2019 7:39 AM 7 Days Post-Op  Subjective:  No complaints. States she is feeling good overall    Vitals:   07/21/19 2348 07/22/19 0429  BP: (!) 118/98 (!) 105/49  Pulse: 84 83  Resp: 20 18  Temp: 98 F (36.7 C) 98.1 F (36.7 C)  SpO2: 97% 97%   Physical Exam: General: well appearing, well nourished, not in any distress Cardiac: regular Lungs: non labored Incisions: incisions clean, dry and intact Extremities:  Moving all extremities. Well perfused. Motor and sensory intact. Palpable pedal pulses. Feet warm Abdomen: soft and non tender Neurologic: alert and oriented  CBC    Component Value Date/Time   WBC 10.9 (H) 07/17/2019 0327   RBC 3.28 (L) 07/17/2019 0327   HGB 8.4 (L) 07/17/2019 0327   HCT 27.5 (L) 07/17/2019 0327   PLT 253 07/17/2019 0327   MCV 83.8 07/17/2019 0327   MCH 25.6 (L) 07/17/2019 0327   MCHC 30.5 07/17/2019 0327   RDW 20.4 (H) 07/17/2019 0327   LYMPHSABS 0.4 (L) 07/15/2019 2149   MONOABS 0.5 07/15/2019 2149   EOSABS 0.0 07/15/2019 2149   BASOSABS 0.0 07/15/2019 2149    BMET    Component Value Date/Time   NA 139 07/17/2019 0327   K 4.1 07/17/2019 0327   CL 107 07/17/2019 0327   CO2 22 07/17/2019 0327   GLUCOSE 157 (H) 07/17/2019 0327   BUN 24 (H) 07/17/2019 0327   CREATININE 0.70 07/17/2019 0327   CREATININE 0.68 09/20/2017 1030   CALCIUM 7.9 (L) 07/17/2019 0327   GFRNONAA >60 07/17/2019 0327   GFRNONAA 96 09/20/2017 1030   GFRAA >60 07/17/2019 0327   GFRAA 111 09/20/2017 1030    INR    Component Value Date/Time   INR 1.5 (H) 07/15/2019 1518     Intake/Output Summary (Last 24 hours) at 07/22/2019 0739 Last data filed at 07/21/2019 2155 Gross per 24 hour  Intake 380 ml  Output 990 ml  Net -610 ml     Assessment/Plan:  64 y.o. female is s/p  s/p Antegrade thoracic stent graft in descending thoracic aorta for pseudoaneurysm in conjunction with CT surgery for total aortic arch replacement with hemashield  graft 7 Days Post-Op. Extremities well perfused with palpable pedal pulses. Remains hemodynamically stable. Mobilize as tolerated.   Graceann Congress, PA-C [facility] [phone] 07/22/2019 7:39 AM

## 2019-07-22 NOTE — Progress Notes (Signed)
Patient has tried going without oxygen support, after about 15 min, she feels like her throat is closing up---patient placed back on 1L O2, will try again tomorrow on room air

## 2019-07-23 ENCOUNTER — Other Ambulatory Visit: Payer: Self-pay

## 2019-07-23 DIAGNOSIS — I712 Thoracic aortic aneurysm, without rupture, unspecified: Secondary | ICD-10-CM

## 2019-07-23 LAB — CBC WITH DIFFERENTIAL/PLATELET
Abs Immature Granulocytes: 0.09 10*3/uL — ABNORMAL HIGH (ref 0.00–0.07)
Basophils Absolute: 0.1 10*3/uL (ref 0.0–0.1)
Basophils Relative: 1 %
Eosinophils Absolute: 0.5 10*3/uL (ref 0.0–0.5)
Eosinophils Relative: 6 %
HCT: 28.4 % — ABNORMAL LOW (ref 36.0–46.0)
Hemoglobin: 8.6 g/dL — ABNORMAL LOW (ref 12.0–15.0)
Immature Granulocytes: 1 %
Lymphocytes Relative: 23 %
Lymphs Abs: 1.8 10*3/uL (ref 0.7–4.0)
MCH: 26 pg (ref 26.0–34.0)
MCHC: 30.3 g/dL (ref 30.0–36.0)
MCV: 85.8 fL (ref 80.0–100.0)
Monocytes Absolute: 0.8 10*3/uL (ref 0.1–1.0)
Monocytes Relative: 10 %
Neutro Abs: 4.9 10*3/uL (ref 1.7–7.7)
Neutrophils Relative %: 59 %
Platelets: 424 10*3/uL — ABNORMAL HIGH (ref 150–400)
RBC: 3.31 MIL/uL — ABNORMAL LOW (ref 3.87–5.11)
RDW: 20.2 % — ABNORMAL HIGH (ref 11.5–15.5)
WBC: 8.1 10*3/uL (ref 4.0–10.5)
nRBC: 0 % (ref 0.0–0.2)

## 2019-07-23 LAB — COMPREHENSIVE METABOLIC PANEL
ALT: 11 U/L (ref 0–44)
AST: 10 U/L — ABNORMAL LOW (ref 15–41)
Albumin: 2.3 g/dL — ABNORMAL LOW (ref 3.5–5.0)
Alkaline Phosphatase: 49 U/L (ref 38–126)
Anion gap: 9 (ref 5–15)
BUN: 18 mg/dL (ref 8–23)
CO2: 32 mmol/L (ref 22–32)
Calcium: 8.6 mg/dL — ABNORMAL LOW (ref 8.9–10.3)
Chloride: 95 mmol/L — ABNORMAL LOW (ref 98–111)
Creatinine, Ser: 0.69 mg/dL (ref 0.44–1.00)
GFR calc Af Amer: >60 mL/min (ref >60)
GFR calc non Af Amer: >60 mL/min (ref >60)
Glucose, Bld: 99 mg/dL (ref 70–99)
Potassium: 5.1 mmol/L (ref 3.5–5.1)
Sodium: 136 mmol/L (ref 135–145)
Total Bilirubin: 0.4 mg/dL (ref 0.3–1.2)
Total Protein: 5.5 g/dL — ABNORMAL LOW (ref 6.5–8.1)

## 2019-07-23 MED ORDER — POTASSIUM CHLORIDE CRYS ER 10 MEQ PO TBCR
10.0000 meq | EXTENDED_RELEASE_TABLET | Freq: Every day | ORAL | Status: DC
  Administered 2019-07-24: 10 meq via ORAL
  Filled 2019-07-23 (×2): qty 1

## 2019-07-23 NOTE — Progress Notes (Signed)
Ref: Ngetich, Nelda Bucks, NP   Subjective:  Awake. Off oxygen but keeps [place] in nostril for placebo effect for fear of hypoxemia. Monitor: Sinus rhythm. Afebrile. CBC reveal Hgb 8.6 gm. Potassium high normal. Calcium level is improving. Albumin remains low at 2.3 g/dL. She claims she ambulates well and wants to go home. She refuses short term SNF placement.  Objective:  Vital Signs in the last 24 hours: Temp:  [97.7 F (36.5 C)-98.3 F (36.8 C)] 98.3 F (36.8 C) (05/04 0738) Pulse Rate:  [84-94] 88 (05/04 0804) Cardiac Rhythm: Normal sinus rhythm (05/04 0700) Resp:  [16-21] 17 (05/04 0804) BP: (100-124)/(49-62) 112/49 (05/04 0738) SpO2:  [93 %-98 %] 93 % (05/04 0804) Weight:  [49 kg] 49 kg (05/04 0500)  Physical Exam: BP Readings from Last 1 Encounters:  07/23/19 (!) 112/49     Wt Readings from Last 1 Encounters:  07/23/19 49 kg    Weight change: 0.42 kg Body mass index is 19.76 kg/m. HEENT: [place]/AT, Eyes-Blue, PERL, EOMI, Conjunctiva-Pale, Sclera-Non-icteric Neck: No JVD, No bruit, Trachea midline. Lungs:  Clear, Bilateral. Cardiac:  Regular rhythm, normal S1 and S2, no S3. II/VI systolic murmur. Abdomen:  Soft, non-tender. BS present. Extremities:  No edema present. No cyanosis. No clubbing. CNS: AxOx3, Cranial nerves grossly intact, moves all 4 extremities.  Skin: Warm and dry.   Intake/Output from previous day: 05/03 0701 - 05/04 0700 In: 250 [P.O.:240; I.V.:10] Out: -     Lab Results: BMET    Component Value Date/Time   NA 136 07/23/2019 0238   NA 139 07/17/2019 0327   NA 138 07/16/2019 1702   K 5.1 07/23/2019 0238   K 4.1 07/17/2019 0327   K 4.4 07/16/2019 1702   CL 95 (L) 07/23/2019 0238   CL 107 07/17/2019 0327   CL 108 07/16/2019 1702   CO2 32 07/23/2019 0238   CO2 22 07/17/2019 0327   CO2 22 07/16/2019 1702   GLUCOSE 99 07/23/2019 0238   GLUCOSE 157 (H) 07/17/2019 0327   GLUCOSE 143 (H) 07/16/2019 1702   BUN 18 07/23/2019 0238   BUN 24 (H)  07/17/2019 0327   BUN 22 07/16/2019 1702   CREATININE 0.69 07/23/2019 0238   CREATININE 0.70 07/17/2019 0327   CREATININE 0.70 07/16/2019 1702   CREATININE 0.68 09/20/2017 1030   CREATININE 0.73 07/18/2017 1414   CREATININE 0.60 03/08/2017 1231   CALCIUM 8.6 (L) 07/23/2019 0238   CALCIUM 7.9 (L) 07/17/2019 0327   CALCIUM 8.0 (L) 07/16/2019 1702   GFRNONAA >60 07/23/2019 0238   GFRNONAA >60 07/17/2019 0327   GFRNONAA >60 07/16/2019 1702   GFRNONAA 96 09/20/2017 1030   GFRNONAA 100 03/08/2017 1231   GFRNONAA >90 01/18/2011 1053   GFRAA >60 07/23/2019 0238   GFRAA >60 07/17/2019 0327   GFRAA >60 07/16/2019 1702   GFRAA 111 09/20/2017 1030   GFRAA 116 03/08/2017 1231   GFRAA >90 01/18/2011 1053   CBC    Component Value Date/Time   WBC 8.1 07/23/2019 0238   RBC 3.31 (L) 07/23/2019 0238   HGB 8.6 (L) 07/23/2019 0238   HCT 28.4 (L) 07/23/2019 0238   PLT 424 (H) 07/23/2019 0238   MCV 85.8 07/23/2019 0238   MCH 26.0 07/23/2019 0238   MCHC 30.3 07/23/2019 0238   RDW 20.2 (H) 07/23/2019 0238   LYMPHSABS 1.8 07/23/2019 0238   MONOABS 0.8 07/23/2019 0238   EOSABS 0.5 07/23/2019 0238   BASOSABS 0.1 07/23/2019 0238   HEPATIC Function Panel Recent  Labs    05/28/19 2143 07/15/19 2149 07/23/19 0238  PROT 6.9 4.9* 5.5*   HEMOGLOBIN A1C No components found for: HGA1C,  MPG CARDIAC ENZYMES Lab Results  Component Value Date   CKTOTAL 38 09/20/2017   TROPONINI <0.03 11/28/2016   TROPONINI <0.30 07/11/2013   TROPONINI <0.30 02/28/2013   BNP No results for input(s): PROBNP in the last 8760 hours. TSH No results for input(s): TSH in the last 8760 hours. CHOLESTEROL Recent Labs    01/06/19 0628 05/29/19 0440  CHOL 127 102    Scheduled Meds: . sodium chloride   Intravenous Once  . aspirin EC  81 mg Oral Daily  . atorvastatin  10 mg Oral q1800  . bisacodyl  10 mg Oral Daily   Or  . bisacodyl  10 mg Rectal Daily  . Chlorhexidine Gluconate Cloth  6 each Topical Daily   . citalopram  20 mg Oral Daily  . docusate sodium  200 mg Oral Daily  . furosemide  40 mg Oral Daily  . gabapentin  600 mg Oral TID  . guaiFENesin  600 mg Oral BID  . mouth rinse  15 mL Mouth Rinse BID  . metoprolol tartrate  12.5 mg Oral BID   Or  . metoprolol tartrate  12.5 mg Per Tube BID  . nicotine  14 mg Transdermal Daily  . pantoprazole  40 mg Oral Daily  . potassium chloride  20 mEq Oral Daily  . sodium chloride flush  10-40 mL Intracatheter Q12H  . sucralfate  1 g Oral TID WC & HS   Continuous Infusions: . sodium chloride Stopped (07/16/19 0756)  . insulin Stopped (07/16/19 0805)  . lactated ringers    . 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 F/U in 1 week. SNF recommended but patient refuses.   LOS: 12 days   Time spent including chart review, lab review, examination, discussion with patient :  min   Orpah Cobb  MD  07/23/2019, 9:00 AM

## 2019-07-23 NOTE — Progress Notes (Signed)
      [address]       [place] [id number]             [phone]      8 Days Post-Op Procedure(s) (LRB): TOTAL AORTIC ARCH REPLACEMENT USING HEMASHIELD PLATINUM GRAFT SIZE (N/A) TRANSESOPHAGEAL ECHOCARDIOGRAM (TEE) (N/A) Thoracic Aortic Endovascular Repair of Distal Thoracic Pseudoaneurysm ( Stent Graft) - Using GORE TAG Conformable Thoracic Stent Graft - Size (N/A)   Subjective:  Patient doing well.  She is happy to be eating more and back on regular liquids.  She states she feels like her throat is closing if she doesn't have oxygen on.  Objective: Vital signs in last 24 hours: Temp:  [97.7 F (36.5 C)-98.3 F (36.8 C)] 97.7 F (36.5 C) (05/03 2351) Pulse Rate:  [84-94] 88 (05/04 0500) Cardiac Rhythm: Normal sinus rhythm (05/03 1921) Resp:  [16-21] 16 (05/04 0500) BP: (101-126)/(52-62) 113/62 (05/03 2105) SpO2:  [97 %-100 %] 97 % (05/04 0500) Weight:  [49 kg] 49 kg (05/04 0500)  Intake/Output from previous day: 05/03 0701 - 05/04 0700 In: 250 [P.O.:240; I.V.:10] Out: -   General appearance: alert, cooperative and no distress Heart: regular rate and rhythm Lungs: clear to auscultation bilaterally Abdomen: soft, non-tender; bowel sounds normal; no masses,  no organomegaly Extremities: extremities normal, atraumatic, no cyanosis or edema Wound: clean and dry  Lab Results: Recent Labs    07/23/19 0238  WBC 8.1  HGB 8.6*  HCT 28.4*  PLT 424*   BMET:  Recent Labs    07/23/19 0238  NA 136  K 5.1  CL 95*  CO2 32  GLUCOSE 99  BUN 18  CREATININE 0.69  CALCIUM 8.6*    PT/INR: No results for input(s): LABPROT, INR in the last 72 hours. ABG    Component Value Date/Time   PHART 7.428 07/15/2019 2212   HCO3 22.7 07/15/2019 2212   TCO2 24 07/15/2019 2212   ACIDBASEDEF 1.0 07/15/2019 2212   O2SAT 95.0 07/15/2019 2212   CBG (last 3)  No results for input(s): GLUCAP in the last 72 hours.  Assessment/Plan: S/P Procedure(s)  (LRB): TOTAL AORTIC ARCH REPLACEMENT USING HEMASHIELD PLATINUM GRAFT SIZE (N/A) TRANSESOPHAGEAL ECHOCARDIOGRAM (TEE) (N/A) Thoracic 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 CV- NSR, BP stable 2. Pulm- patient wants oxygen, sats were 100 % on 1L, will attempt to turn off today, will have nursing do a walk test to see if she needs with activity 3. Dysphagia- SLP advanced diet yesterday, they have recommended ENT follow up, I have made her an appointment on 5/11 4. Dispo- patient stable, will continue current care, attempt to wean oxygen, H/H arrangements have been made, will plan to d/c in AM   LOS: 12 days    Lowella Dandy, PA-C  07/23/2019

## 2019-07-23 NOTE — Progress Notes (Signed)
  Speech Language Pathology Treatment: Dysphagia  Patient Details Name: Brittany [name] MRN: [id number] DOB: [birth year] Today's Date: 07/23/2019 Time: 1120-1140 SLP Time Calculation (min) (ACUTE ONLY): 20 min  Assessment / Plan / Recommendation Clinical Impression  Pt seen at bedside during lunch to assess diet tolerance and continue education. Pt anticipates DC tomorrow, and indicates she has an appointment with ENT. Pt was observed during lunch. No overt s/s aspiration observed or reported. Home health ST recommended once ENT visit results are available.   HPI HPI: Brittany [name]  64 y.o. female who underwent TOTAL AORTIC ARCH REPLACEMENT secondary to AORTIC PSEUDOANEURYSM, s/p TEVAR. Risk of recurrent laryngeal nerve injury.       SLP Plan  Acute goals met       Recommendations  Diet recommendations: Dysphagia 3 (mechanical soft);Thin liquid Liquids provided via: Straw Medication Administration: Whole meds with puree Supervision: Patient able to self feed;Intermittent supervision to cue for compensatory strategies Compensations: Slow rate;Clear throat intermittently;Effortful swallow Postural Changes and/or Swallow Maneuvers: Seated upright 90 degrees                Oral Care Recommendations: Oral care BID Follow up Recommendations: Home health SLP SLP Visit Diagnosis: Dysphagia, pharyngeal phase (R13.13) Plan: f/u next level of care       [place] B. Quentin Ore, [facility], [place] Speech Language Pathologist Office: [phone] Pager: [phone]  Shonna Chock 07/23/2019, 12:49 PM

## 2019-07-23 NOTE — Progress Notes (Signed)
CARDIAC REHAB PHASE I   PRE:  Rate/Rhythm: 88 SR    BP: sitting 114/59    SaO2: 97 RA  MODE:  Ambulation: 350 ft   POST:  Rate/Rhythm: 98 SR    BP: sitting 15/67     SaO2: 98 RA  Pt ambulated hall without AD, standby assist. Fairly steady, no LOB. She did fatigue more easily. Rest after 175 ft (standing). Shorter distance. She has RW at home that will serve her well for longer distances. Practiced IS, 750 mL. Will f/u am for education. [id number]   Harriet Masson CES, ACSM 07/23/2019 1:46 PM

## 2019-07-23 NOTE — Progress Notes (Signed)
Occupational Therapy Treatment Patient Details Name: Brittany [name] MRN: [id number] DOB: 1956/03/21 Today's Date: 07/23/2019    History of present illness Brittany [name] is a 64 yo F with Hx of Thoracic Aortic Aneurysm (s/p repair/stenting Feb 2021), HTN, Substance Use, CVA, GERD, COPD, Anxiety/Depresion, Hep C, and Neuropathy who presents with 3 days of worsening chest pain.  CTA was performed, found to have pseudoaneurysms at distal and proximal ends of graft. She is now s/p TOTAL AORTIC ARCH REPLACEMENT and Thoracic Aortic Endovascular Repair of Distal Thoracic Pseudoaneurysm.   OT comments  Pt progressing OOB ADL and energy conservation. Pt consistently following precautions with log roll and stating sternal precautions. Pt education on energy conservation techniques for OOB ADL and mobility. Pt able to perform log roll and figure 4 technique for LB ADL with no cue to assist to abide by precautions. Pt simulating tub transfer with minguardA. Pt would benefit from continued OT skilled services. OT following acutely. VSS on RA.     Follow Up Recommendations  Home health OT;Supervision - Intermittent    Equipment Recommendations  None recommended by OT    Recommendations for Other Services      Precautions / Restrictions Precautions Precautions: Fall;Sternal Precaution Comments: reviewed sternal precautions; stating "no push no pull and my arms can't go way out here." Restrictions Weight Bearing Restrictions: Yes Other Position/Activity Restrictions: for sternal precautions       Mobility Bed Mobility Overal bed mobility: Needs Assistance Bed Mobility: Supine to Sit;Sit to Supine     Supine to sit: Supervision     General bed mobility comments: Pt using pillow correctly and rolling to side prior to sitting upright  Transfers Overall transfer level: Needs assistance   Transfers: Sit to/from Stand;Stand Pivot Transfers Sit to Stand: Supervision         General transfer  comment: no cues required    Balance Overall balance assessment: Needs assistance   Sitting balance-Leahy Scale: Good       Standing balance-Leahy Scale: Good Standing balance comment: No AD for mobility                           ADL either performed or assessed with clinical judgement   ADL Overall ADL's : Needs assistance/impaired     Grooming: Supervision/safety;Standing       Lower Body Bathing: Supervison/ safety;Sitting/lateral leans;Sit to/from stand Lower Body Bathing Details (indicate cue type and reason): figure 4 technique in sitting or standing for task Upper Body Dressing : Set up;Sitting               Tub/ Shower Transfer: Supervision/safety;Ambulation Tub/Shower Transfer Details (indicate cue type and reason): Pt simulating tub transfer in room with no physical assist. Pt advised to ask landlord for new grab bars in BA Functional mobility during ADLs: Supervision/safety General ADL Comments: Pt education on energy conservation techniques for OOB ADL and mobility. Pt able to perform log roll and figure 4 technique for LB ADL with no cue to assist to abide by precautions.     Vision   Vision Assessment?: No apparent visual deficits   Perception     Praxis      Cognition Arousal/Alertness: Awake/alert Behavior During Therapy: WFL for tasks assessed/performed Overall Cognitive Status: Within Functional Limits for tasks assessed  General Comments: consistently following precautions with log roll and stating sternal precautions        Exercises     Shoulder Instructions       General Comments Pt appears much improved from initial eval; O2>90% on RA.    Pertinent Vitals/ Pain       Pain Assessment: 0-10 Pain Score: 3  Pain Location: upper chest Pain Descriptors / Indicators: Sore Pain Intervention(s): Monitored during session;Premedicated before session  Home Living                                           Prior Functioning/Environment              Frequency  Min 2X/week        Progress Toward Goals  OT Goals(current goals can now be found in the care plan section)  Progress towards OT goals: Progressing toward goals  Acute Rehab OT Goals Patient Stated Goal: home OT Goal Formulation: With patient Time For Goal Achievement: 07/31/19 Potential to Achieve Goals: Good ADL Goals Pt Will Perform Upper Body Bathing: with modified independence;sitting Pt Will Perform Upper Body Dressing: with modified independence;sitting Pt Will Transfer to Toilet: with modified independence;ambulating;regular height toilet Additional ADL Goal #1: Pt will recall and apply sternal precautions to BADL activity independently Additional ADL Goal #2: Pt will recall and apply 3-5 ECS strategies to BADL activity  Plan Discharge plan remains appropriate    Co-evaluation                 AM-PAC OT "6 Clicks" Daily Activity     Outcome Measure   Help from another person eating meals?: None Help from another person taking care of personal grooming?: None Help from another person toileting, which includes using toliet, bedpan, or urinal?: None Help from another person bathing (including washing, rinsing, drying)?: A Little Help from another person to put on and taking off regular upper body clothing?: None Help from another person to put on and taking off regular lower body clothing?: A Little 6 Click Score: 22    End of Session    OT Visit Diagnosis: Unsteadiness on feet (R26.81);Other abnormalities of gait and mobility (R26.89);Pain Pain - part of body: (sternum)   Activity Tolerance Patient tolerated treatment well   Patient Left in chair;with call bell/phone within reach   Nurse Communication Mobility status        Time: 5916-3846 OT Time Calculation (min): 17 min  Charges: OT General Charges $OT Visit: 1 Visit OT Treatments $Self  Care/Home Management : 8-22 mins  Jefferey Pica, OTR/L [place] Pager: [phone] Office: [phone]   Raeanne Deschler C 07/23/2019, 3:30 PM

## 2019-07-23 NOTE — Plan of Care (Signed)
  Problem: Education: Goal: Knowledge of General Education information will improve Description: Including pain rating scale, medication(s)/side effects and non-pharmacologic comfort measures Outcome: Progressing   Problem: Health Behavior/Discharge Planning: Goal: Ability to manage health-related needs will improve Outcome: Progressing   Problem: Clinical Measurements: Goal: Ability to maintain clinical measurements within normal limits will improve Outcome: Progressing Goal: Will remain free from infection Outcome: Progressing Goal: Diagnostic test results will improve Outcome: Progressing Goal: Respiratory complications will improve Outcome: Progressing Goal: Cardiovascular complication will be avoided Outcome: Progressing   Problem: Activity: Goal: Risk for activity intolerance will decrease Outcome: Progressing   Problem: Nutrition: Goal: Adequate nutrition will be maintained Outcome: Progressing   Problem: Coping: Goal: Level of anxiety will decrease Outcome: Progressing   Problem: Elimination: Goal: Will not experience complications related to bowel motility Outcome: Progressing Goal: Will not experience complications related to urinary retention Outcome: Progressing   Problem: Pain Managment: Goal: General experience of comfort will improve Outcome: Progressing   Problem: Safety: Goal: Ability to remain free from injury will improve Outcome: Progressing   Problem: Skin Integrity: Goal: Risk for impaired skin integrity will decrease Outcome: Progressing   Problem: Cardiac: Goal: Will achieve and/or maintain hemodynamic stability Outcome: Progressing   Problem: Respiratory: Goal: Respiratory status will improve Outcome: Progressing   Problem: Urinary Elimination: Goal: Ability to achieve and maintain adequate renal perfusion and functioning will improve Outcome: Progressing   

## 2019-07-23 NOTE — TOC Progression Note (Addendum)
Transition of Care (TOC) - Progression Note    Patient Details  Name: Brittany [name] MRN: [id number] Date of Birth: 03/20/1956  Transition of Care (TOC) CM/SW Contact   M , LCSW Phone Number: 07/23/2019, 2:11 PM  Clinical Narrative:     CSW met with patient at bedside, completed referral paperwork for [facility] to have a bed and bed frame delivered to patient's home. All forms completed at this time, patient will be contacted regarding delivery date and time. CSW working with CSW Jenna with Heart Failure Team to aid in referral process.     Barriers to Discharge: Continued Medical Work up  Expected Discharge Plan and Services                             DME Agency: NA       HH Arranged: PT HH Agency: [facility] Date HH Agency Contacted: 07/22/19 Time HH Agency Contacted: 1306 Representative spoke with at HH Agency: Brittany   Social Determinants of Health (SDOH) Interventions    Readmission Risk Interventions Readmission Risk Prevention Plan 05/31/2019  Transportation Screening Complete  PCP or Specialist Appt within 3-5 Days Complete  HRI or Home Care Consult Complete  Social Work Consult for Recovery Care Planning/Counseling Complete  Palliative Care Screening Not Applicable  Medication Review (RN Care Manager) Complete  Some recent data might be hidden   

## 2019-07-24 ENCOUNTER — Telehealth (HOSPITAL_COMMUNITY): Payer: Self-pay | Admitting: Licensed Clinical Social Worker

## 2019-07-24 ENCOUNTER — Telehealth: Payer: Self-pay | Admitting: *Deleted

## 2019-07-24 MED ORDER — FUROSEMIDE 40 MG PO TABS: 40.0000 mg | ORAL_TABLET | Freq: Every day | ORAL | 0 refills | Status: DC

## 2019-07-24 MED ORDER — OXYCODONE HCL 5 MG PO TABS: 5.0000 mg | ORAL_TABLET | ORAL | 0 refills | Status: DC | PRN

## 2019-07-24 NOTE — Plan of Care (Signed)
  Problem: Education: Goal: Knowledge of General Education information will improve Description: Including pain rating scale, medication(s)/side effects and non-pharmacologic comfort measures Outcome: Adequate for Discharge   Problem: Health Behavior/Discharge Planning: Goal: Ability to manage health-related needs will improve Outcome: Adequate for Discharge   Problem: Clinical Measurements: Goal: Ability to maintain clinical measurements within normal limits will improve Outcome: Adequate for Discharge Goal: Will remain free from infection Outcome: Adequate for Discharge Goal: Diagnostic test results will improve Outcome: Adequate for Discharge Goal: Respiratory complications will improve Outcome: Adequate for Discharge Goal: Cardiovascular complication will be avoided Outcome: Adequate for Discharge   Problem: Activity: Goal: Risk for activity intolerance will decrease Outcome: Adequate for Discharge   Problem: Nutrition: Goal: Adequate nutrition will be maintained Outcome: Adequate for Discharge   Problem: Coping: Goal: Level of anxiety will decrease Outcome: Adequate for Discharge   Problem: Elimination: Goal: Will not experience complications related to bowel motility Outcome: Adequate for Discharge Goal: Will not experience complications related to urinary retention Outcome: Adequate for Discharge   Problem: Pain Managment: Goal: General experience of comfort will improve Outcome: Adequate for Discharge   Problem: Safety: Goal: Ability to remain free from injury will improve Outcome: Adequate for Discharge   Problem: Skin Integrity: Goal: Risk for impaired skin integrity will decrease Outcome: Adequate for Discharge   Problem: Cardiac: Goal: Will achieve and/or maintain hemodynamic stability Outcome: Adequate for Discharge   Problem: Respiratory: Goal: Respiratory status will improve Outcome: Adequate for Discharge   Problem: Urinary  Elimination: Goal: Ability to achieve and maintain adequate renal perfusion and functioning will improve Outcome: Adequate for Discharge   Discharge instructions given to patient. Questions answered. Educated on new medication regimen, signs/symptoms, restrictions, and follow up appointments. Prescriptions [facility]. PIV DC, hemostasis achieved. Vital signs stable. All belongings sent home with patient.   Pt escorted by NT via wheelchair to private vehicle driven by family.

## 2019-07-24 NOTE — Progress Notes (Signed)
Discussed sternal precautions, IS, exercise, diet, smoking cessation, and restrictions. Pt receptive. She is going to try to quit smoking but unfortunately her roommate smokes in the house. N/a for CRPII.  [id number] Brittany [name] CES, ACSM 9:40 AM 07/24/2019

## 2019-07-24 NOTE — TOC Transition Note (Signed)
Transition of Care [facility]) - CM/SW Discharge Note   Patient Details  Name: Brittany [name] MRN: [id number] Date of Birth: 07/22/1955  Transition of Care [facility]) CM/SW Contact:  Leone Haven, RN Phone Number: 07/24/2019, 9:21 AM   Clinical Narrative:    Patient is for dc today, NCM notified Kandee Keen with [facility], patient states she has a walker which she got 3 months ago, she states she would like a cane.  NCM made referral to [facility] with [facility], he states he will call patient because she just recently got the walker so she may have to pay for the cane.   Also NCM informed patient that [facility] will deliver they free hospital bed on 5/25 and she states she is ok with that.     Final next level of care: Home w Home Health Services Barriers to Discharge: No Barriers Identified   Patient Goals and CMS Choice Patient states their goals for this hospitalization and ongoing recovery are:: get better CMS Medicare.gov Compare Post Acute Care list provided to:: Patient Choice offered to / list presented to : Patient  Discharge Placement                       Discharge Plan and Services                DME Arranged: Gilmer Mor DME Agency: [facility] Date DME Agency Contacted: 07/24/19 Time DME Agency Contacted: [phone] Representative spoke with at DME Agency: Ian Malkin HH Arranged: PT, RN, Speech Therapy HH Agency: [facility] Date [facility] Agency Contacted: 07/22/19 Time HH Agency Contacted: 1300 Representative spoke with at [facility] Agency: Kandee Keen  Social Determinants of Health (SDOH) Interventions     Readmission Risk Interventions Readmission Risk Prevention Plan 05/31/2019  Transportation Screening Complete  PCP or Specialist Appt within 3-5 Days Complete  HRI or Home Care Consult Complete  Social Work Consult for Recovery Care Planning/Counseling Complete  Palliative Care Screening Not Applicable  Medication Review Oceanographer) Complete  Some recent data might be hidden

## 2019-07-24 NOTE — Discharge Instructions (Signed)
Discharge Instructions:   DIET- Dysphagia 3 (ground meat, soft foods), regular liquids  1. You may shower, please wash incisions daily with soap and water and keep dry.  If you wish to cover wounds with dressing you may do so but please keep clean and change daily.  No tub baths or swimming until incisions have completely healed.  If your incisions become red or develop any drainage please call our office at [phone]  2. No Driving until cleared by Dr. Vickey Sages office and you are no longer using narcotic pain medications  3. Monitor your weight daily.. Please use the same scale and weigh at same time... If you gain 5-10 lbs in 48 hours with associated lower extremity swelling, please contact our office at [phone]  4. Fever of 101.5 for at least 24 hours with no source, please contact our office at [phone]  5. Activity- up as tolerated, please walk at least 3 times per day.  Avoid strenuous activity, no lifting, pushing, or pulling with your arms over 8-10 lbs for a minimum of 6 weeks  6. If any questions or concerns arise, please do not hesitate to contact our office at [phone]

## 2019-07-24 NOTE — Care Management Important Message (Signed)
Important Message  Patient Details  Name: Earlean Fidalgo MRN: [id number] Date of Birth: 1955-06-03   Medicare Important Message Given:  Yes  Patient left prior to IM delivery .  Will mail to patient home address.    Karielle Davidow 07/24/2019, 2:29 PM

## 2019-07-24 NOTE — Progress Notes (Signed)
Ref: Ngetich, Dinah C, NP   Subjective:  Breathing has improved. She is maintaining 93 % O2 sat on room air. VS are stable. Ambulating well.  Objective:  Vital Signs in the last 24 hours: Temp:  [97.9 F (36.6 C)-98.3 F (36.8 C)] 98.2 F (36.8 C) (05/05 0704) Pulse Rate:  [85-93] 92 (05/05 0704) Cardiac Rhythm: Normal sinus rhythm (05/05 0700) Resp:  [13-20] 18 (05/05 0704) BP: (114-120)/(57-66) 117/65 (05/05 0704) SpO2:  [90 %-98 %] 93 % (05/05 0704) Weight:  [48.2 kg] 48.2 kg (05/05 0600)  Physical Exam: BP Readings from Last 1 Encounters:  07/24/19 117/65     Wt Readings from Last 1 Encounters:  07/24/19 48.2 kg    Weight change: -0.8 kg Body mass index is 19.44 kg/m. HEENT: [place]/AT, Eyes-Blue, PERL, EOMI, Conjunctiva-Pale, Sclera-Non-icteric Neck: No JVD, No bruit, Trachea midline. Lungs:  Clear, Bilateral. Midline scar of surgery. Cardiac:  Regular rhythm, normal S1 and S2, no S3. II/VI systolic murmur. Abdomen:  Soft, non-tender. BS present. Extremities:  No edema present. No cyanosis. No clubbing. CNS: AxOx3, Cranial nerves grossly intact, moves all 4 extremities.  Skin: Warm and dry.   Intake/Output from previous day: 05/04 0701 - 05/05 0700 In: 730 [P.O.:720; I.V.:10] Out: -     Lab Results: BMET    Component Value Date/Time   NA 136 07/23/2019 0238   NA 139 07/17/2019 0327   NA 138 07/16/2019 1702   K 5.1 07/23/2019 0238   K 4.1 07/17/2019 0327   K 4.4 07/16/2019 1702   CL 95 (L) 07/23/2019 0238   CL 107 07/17/2019 0327   CL 108 07/16/2019 1702   CO2 32 07/23/2019 0238   CO2 22 07/17/2019 0327   CO2 22 07/16/2019 1702   GLUCOSE 99 07/23/2019 0238   GLUCOSE 157 (H) 07/17/2019 0327   GLUCOSE 143 (H) 07/16/2019 1702   BUN 18 07/23/2019 0238   BUN 24 (H) 07/17/2019 0327   BUN 22 07/16/2019 1702   CREATININE 0.69 07/23/2019 0238   CREATININE 0.70 07/17/2019 0327   CREATININE 0.70 07/16/2019 1702   CREATININE 0.68 09/20/2017 1030   CREATININE  0.73 07/18/2017 1414   CREATININE 0.60 03/08/2017 1231   CALCIUM 8.6 (L) 07/23/2019 0238   CALCIUM 7.9 (L) 07/17/2019 0327   CALCIUM 8.0 (L) 07/16/2019 1702   GFRNONAA >60 07/23/2019 0238   GFRNONAA >60 07/17/2019 0327   GFRNONAA >60 07/16/2019 1702   GFRNONAA 96 09/20/2017 1030   GFRNONAA 100 03/08/2017 1231   GFRNONAA >90 01/18/2011 1053   GFRAA >60 07/23/2019 0238   GFRAA >60 07/17/2019 0327   GFRAA >60 07/16/2019 1702   GFRAA 111 09/20/2017 1030   GFRAA 116 03/08/2017 1231   GFRAA >90 01/18/2011 1053   CBC    Component Value Date/Time   WBC 8.1 07/23/2019 0238   RBC 3.31 (L) 07/23/2019 0238   HGB 8.6 (L) 07/23/2019 0238   HCT 28.4 (L) 07/23/2019 0238   PLT 424 (H) 07/23/2019 0238   MCV 85.8 07/23/2019 0238   MCH 26.0 07/23/2019 0238   MCHC 30.3 07/23/2019 0238   RDW 20.2 (H) 07/23/2019 0238   LYMPHSABS 1.8 07/23/2019 0238   MONOABS 0.8 07/23/2019 0238   EOSABS 0.5 07/23/2019 0238   BASOSABS 0.1 07/23/2019 0238   HEPATIC Function Panel Recent Labs    05/28/19 2143 07/15/19 2149 07/23/19 0238  PROT 6.9 4.9* 5.5*   HEMOGLOBIN A1C No components found for: HGA1C,  MPG CARDIAC ENZYMES Lab Results  Component  Value Date   CKTOTAL 38 09/20/2017   TROPONINI <0.03 11/28/2016   TROPONINI <0.30 07/11/2013   TROPONINI <0.30 02/28/2013   BNP No results for input(s): PROBNP in the last 8760 hours. TSH No results for input(s): TSH in the last 8760 hours. CHOLESTEROL Recent Labs    01/06/19 0628 05/29/19 0440  CHOL 127 102    Scheduled Meds: . sodium chloride   Intravenous Once  . aspirin EC  81 mg Oral Daily  . atorvastatin  10 mg Oral q1800  . bisacodyl  10 mg Oral Daily   Or  . bisacodyl  10 mg Rectal Daily  . Chlorhexidine Gluconate Cloth  6 each Topical Daily  . citalopram  20 mg Oral Daily  . docusate sodium  200 mg Oral Daily  . furosemide  40 mg Oral Daily  . gabapentin  600 mg Oral TID  . guaiFENesin  600 mg Oral BID  . mouth rinse  15 mL Mouth  Rinse BID  . metoprolol tartrate  12.5 mg Oral BID   Or  . metoprolol tartrate  12.5 mg Per Tube BID  . nicotine  14 mg Transdermal Daily  . pantoprazole  40 mg Oral Daily  . potassium chloride  10 mEq Oral Daily  . sodium chloride flush  10-40 mL Intracatheter Q12H  . sucralfate  1 g Oral TID WC & HS   Continuous Infusions: . sodium chloride Stopped (07/16/19 0756)  . insulin Stopped (07/16/19 0805)  . 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 F/U in 2 weeks. Keep ENT and vascular surgeon appointments.   LOS: 13 days   Time spent including chart review, lab review, examination, discussion with patient : 25 min   Orpah Cobb  MD  07/24/2019, 9:09 AM    Breathing has im

## 2019-07-24 NOTE — Progress Notes (Cosign Needed)
      [address]       [place] [id number]             [phone]      9 Days Post-Op Procedure(s) (LRB): TOTAL AORTIC ARCH REPLACEMENT USING HEMASHIELD PLATINUM GRAFT SIZE (N/A) TRANSESOPHAGEAL ECHOCARDIOGRAM (TEE) (N/A) Thoracic Aortic Endovascular Repair of Distal Thoracic Pseudoaneurysm ( Stent Graft) - Using GORE TAG Conformable Thoracic Stent Graft - Size (N/A)   Subjective:  No new complaints.  She continues to feel better.  She is off her oxygen.   Objective: Vital signs in last 24 hours: Temp:  [97.9 F (36.6 C)-98.3 F (36.8 C)] 98.2 F (36.8 C) (05/05 0704) Pulse Rate:  [85-93] 92 (05/05 0704) Cardiac Rhythm: Normal sinus rhythm (05/05 0700) Resp:  [13-20] 18 (05/05 0704) BP: (114-120)/(57-66) 117/65 (05/05 0704) SpO2:  [90 %-98 %] 93 % (05/05 0704) Weight:  [48.2 kg] 48.2 kg (05/05 0600)  Intake/Output from previous day: 05/04 0701 - 05/05 0700 In: 730 [P.O.:720; I.V.:10] Out: -   General appearance: alert, cooperative and no distress Heart: regular rate and rhythm Lungs: clear to auscultation bilaterally Abdomen: soft, non-tender; bowel sounds normal; no masses,  no organomegaly Extremities: extremities normal, atraumatic, no cyanosis or edema Wound: clean and dry  Lab Results: Recent Labs    07/23/19 0238  WBC 8.1  HGB 8.6*  HCT 28.4*  PLT 424*   BMET:  Recent Labs    07/23/19 0238  NA 136  K 5.1  CL 95*  CO2 32  GLUCOSE 99  BUN 18  CREATININE 0.69  CALCIUM 8.6*    PT/INR: No results for input(s): LABPROT, INR in the last 72 hours. ABG    Component Value Date/Time   PHART 7.428 07/15/2019 2212   HCO3 22.7 07/15/2019 2212   TCO2 24 07/15/2019 2212   ACIDBASEDEF 1.0 07/15/2019 2212   O2SAT 95.0 07/15/2019 2212   CBG (last 3)  No results for input(s): GLUCAP in the last 72 hours.  Assessment/Plan: S/P Procedure(s) (LRB): TOTAL AORTIC ARCH REPLACEMENT USING HEMASHIELD PLATINUM GRAFT SIZE  (N/A) TRANSESOPHAGEAL ECHOCARDIOGRAM (TEE) (N/A) Thoracic 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 CV- NSR, BP controlled- continue Lopressor 2. Pulm- no acute issues, off oxygen, continue IS 3. Renal- weight is below baseline, will give 1 more week of lasix then discontinue 4. Dysphagia/Dysphonia- improving, diet at dysphagia 3, regular liquids.... ENT follow with Dr. Jenne Pane on 5/11 5. Dispo- patient stable, will d/c home today   LOS: 13 days    Lowella Dandy, PA-C  07/24/2019

## 2019-07-24 NOTE — Telephone Encounter (Signed)
CSW contacted by inpatient [facility] [place] CSW regarding concerns with pt home environment.  Pt has been sleeping at home on an air mattress but has now had open heart surgery and care team is worried about her going home and having to get in and out of the low bed with new sternal precautions.  Need assistance getting pt a bed at home but pt unable to afford one.  As pt is a cardiology patient CSW was able to pay for referral to [facility] through the HF Fund to assist pt in getting a bed and bed frame.  Pt will need it delivered, however, and no delivery slots available until 5/25 per [employer].  CSW informed inpatient TOC team- they looked into hospital bed but pt refused at this time and would prefer to wait for bed from [facility].  CSW submitted completed referral and payment to [facility]- they will reach out to pt to schedule "shopping trip" and delivery- once delivery is scheduled they will reach back out to CSW regarding delivery fee payment.  Burna Sis, LCSW Clinical Social Worker [facility] Desk#: [phone] Cell#: [phone]

## 2019-07-24 NOTE — Telephone Encounter (Signed)
I have made the 1st attempt to contact the patient or family member in charge, in order to follow up from recently being discharged from the hospital. I left a message on voicemail but I will make another attempt at a different time.  

## 2019-07-24 NOTE — Progress Notes (Signed)
Removed Midline x 1 and patient received discharge instructions. Pt took her all belonging. HS [employer]

## 2019-07-24 NOTE — Progress Notes (Signed)
Physical Therapy Treatment Patient Details Name: Brittany [name] MRN: [id number] DOB: [birth year] Today's Date: 07/24/2019    History of Present Illness Brittany [name] is a 64 yo F with Hx of Thoracic Aortic Aneurysm (s/p repair/stenting Feb 2021), HTN, Substance Use, CVA, GERD, COPD, Anxiety/Depresion, Hep C, and Neuropathy who presents with 3 days of worsening chest pain.  CTA was performed, found to have pseudoaneurysms at distal and proximal ends of graft. She is now s/p TOTAL AORTIC ARCH REPLACEMENT and Thoracic Aortic Endovascular Repair of Distal Thoracic Pseudoaneurysm.    PT Comments    Patient progressing able to negotiate steps this session appropriate for home entry.  Reviewed safety with sternal precautions and not walking her dog on the leash.  She remains appropriate for HHPT and would benefit form rollator for safety as her yard is full of holes and she has sprained and broken her ankles with walking in the yard.    Follow Up Recommendations  Home health PT;Supervision - Intermittent     Equipment Recommendations  Other (comment)(rollator)    Recommendations for Other Services       Precautions / Restrictions Precautions Precautions: Fall;Sternal Restrictions Weight Bearing Restrictions: Yes(Sternal precaution)    Mobility  Bed Mobility               General bed mobility comments: up in room  Transfers   Equipment used: None Transfers: Sit to/from Stand Sit to Stand: Modified independent (Device/Increase time)         General transfer comment: already up and dressing in the room  Ambulation/Gait   Gait Distance (Feet): 150 Feet Assistive device: 4-wheeled walker Gait Pattern/deviations: Decreased stride length;Step-through pattern     General Gait Details: walking in room no UE support, in hallway with rollator her preference, cues for posture   Stairs Stairs: Yes Stairs assistance: Supervision Stair Management: One rail Right;Step to  pattern;Sideways;Forwards Number of Stairs: 8 General stair comments: first set of steps 1 UE support then demo both hands and side technique due to chest soreness, she performed next set of steps with reports of improved comfort; encouraged to continue step to pattern   Wheelchair Mobility    Modified Rankin (Stroke Patients Only)       Balance     Sitting balance-Leahy Scale: Good     Standing balance support: No upper extremity supported Standing balance-Leahy Scale: Good Standing balance comment: in room no UE support                            Cognition Arousal/Alertness: Awake/alert Behavior During Therapy: WFL for tasks assessed/performed Overall Cognitive Status: Within Functional Limits for tasks assessed                                        Exercises      General Comments General comments (skin integrity, edema, etc.): VSS mild dyspnea, SpO2 97%; reviewed safety with managing her dog on the leash while outside tied to a stable spot without her holding the leash      Pertinent Vitals/Pain Pain Assessment: Faces Faces Pain Scale: Hurts little more Pain Location: upper chest Pain Descriptors / Indicators: Sore Pain Intervention(s): Monitored during session    Home Living                      Prior Function  PT Goals (current goals can now be found in the care plan section) Progress towards PT goals: Progressing toward goals    Frequency           PT Plan Current plan remains appropriate    Co-evaluation              AM-PAC PT "6 Clicks" Mobility   Outcome Measure  Help needed turning from your back to your side while in a flat bed without using bedrails?: None Help needed moving from lying on your back to sitting on the side of a flat bed without using bedrails?: None Help needed moving to and from a bed to a chair (including a wheelchair)?: None Help needed standing up from a chair  using your arms (e.g., wheelchair or bedside chair)?: None Help needed to walk in hospital room?: None Help needed climbing 3-5 steps with a railing? : None 6 Click Score: 24    End of Session   Activity Tolerance: Patient tolerated treatment well Patient left: in bed   PT Visit Diagnosis: Other abnormalities of gait and mobility (R26.89);Difficulty in walking, not elsewhere classified (R26.2);Pain Pain - part of body: (chest)     Time: 0940-1000 PT Time Calculation (min) (ACUTE ONLY): 20 min  Charges:  $Gait Training: 8-22 mins                     Magda Kiel, [place] [phone] 07/24/2019    Brittany [name] 07/24/2019, 10:50 AM

## 2019-07-24 NOTE — Plan of Care (Signed)
  Problem: Education: Goal: Knowledge of General Education information will improve Description: Including pain rating scale, medication(s)/side effects and non-pharmacologic comfort measures Outcome: Progressing   Problem: Health Behavior/Discharge Planning: Goal: Ability to manage health-related needs will improve Outcome: Progressing   Problem: Clinical Measurements: Goal: Ability to maintain clinical measurements within normal limits will improve Outcome: Progressing Goal: Will remain free from infection Outcome: Progressing Goal: Diagnostic test results will improve Outcome: Progressing Goal: Respiratory complications will improve Outcome: Progressing Goal: Cardiovascular complication will be avoided Outcome: Progressing   Problem: Activity: Goal: Risk for activity intolerance will decrease Outcome: Progressing   Problem: Nutrition: Goal: Adequate nutrition will be maintained Outcome: Progressing   Problem: Coping: Goal: Level of anxiety will decrease Outcome: Progressing   Problem: Elimination: Goal: Will not experience complications related to bowel motility Outcome: Progressing Goal: Will not experience complications related to urinary retention Outcome: Progressing   Problem: Pain Managment: Goal: General experience of comfort will improve Outcome: Progressing   Problem: Safety: Goal: Ability to remain free from injury will improve Outcome: Progressing   Problem: Skin Integrity: Goal: Risk for impaired skin integrity will decrease Outcome: Progressing   Problem: Cardiac: Goal: Will achieve and/or maintain hemodynamic stability Outcome: Progressing   Problem: Respiratory: Goal: Respiratory status will improve Outcome: Progressing   Problem: Urinary Elimination: Goal: Ability to achieve and maintain adequate renal perfusion and functioning will improve Outcome: Progressing   

## 2019-07-24 NOTE — Progress Notes (Signed)
[facility]  Subjective  - Doing well and going home today   Objective 117/65 92 98.2 F (36.8 C) (Oral) 18 93%  Intake/Output Summary (Last 24 hours) at 07/24/2019 0729 Last data filed at 07/24/2019 0026 Gross per 24 hour  Intake 730 ml  Output --  Net 730 ml    Palpable pedal pulses B Incisions healing well Moving all 4 extremities Lungs some shortness of breath, no longer O2 dependent  Assessment/Planning: 64 y.o. female is s/p s/p Antegrade thoracic stent graft in descending thoracic aorta for pseudoaneurysm in conjunction with CT surgery for total aortic arch replacement with hemashield graft 8 Days Post-Op.  All 4 extremities well perfused.  Disposition stable for discharge  F/U with Dr. Chestine Spore sent to office for 4 weeks from surgery  Brittany [name] 07/24/2019 7:29 AM --  Laboratory Lab Results: Recent Labs    07/23/19 0238  WBC 8.1  HGB 8.6*  HCT 28.4*  PLT 424*   BMET Recent Labs    07/23/19 0238  NA 136  K 5.1  CL 95*  CO2 32  GLUCOSE 99  BUN 18  CREATININE 0.69  CALCIUM 8.6*    COAG Lab Results  Component Value Date   INR 1.5 (H) 07/15/2019   INR 1.2 07/15/2019   INR 1.1 07/11/2019   No results found for: PTT

## 2019-07-25 DIAGNOSIS — F0280 Dementia in other diseases classified elsewhere without behavioral disturbance: Secondary | ICD-10-CM

## 2019-07-25 DIAGNOSIS — I2510 Atherosclerotic heart disease of native coronary artery without angina pectoris: Secondary | ICD-10-CM

## 2019-07-25 DIAGNOSIS — J449 Chronic obstructive pulmonary disease, unspecified: Secondary | ICD-10-CM

## 2019-07-25 DIAGNOSIS — Z48812 Encounter for surgical aftercare following surgery on the circulatory system: Secondary | ICD-10-CM

## 2019-07-25 DIAGNOSIS — M47812 Spondylosis without myelopathy or radiculopathy, cervical region: Secondary | ICD-10-CM

## 2019-07-25 DIAGNOSIS — G309 Alzheimer's disease, unspecified: Secondary | ICD-10-CM

## 2019-07-25 DIAGNOSIS — I119 Hypertensive heart disease without heart failure: Secondary | ICD-10-CM

## 2019-07-25 DIAGNOSIS — M4312 Spondylolisthesis, cervical region: Secondary | ICD-10-CM

## 2019-07-25 NOTE — Telephone Encounter (Signed)
Transition Care Management Follow-up Telephone Call  Date of discharge and from where: 07/24/19 [place]  How have you been since you were released from the hospital? [facility] for Open Heart Surgery  Any questions or concerns? No   Items Reviewed:  Did the pt receive and understand the discharge instructions provided? Yes   Medications obtained and verified? Yes   Any new allergies since your discharge? No   Dietary orders reviewed? Yes  Do you have support at home? Yes   Other (ie: DME, Home Health, etc) Home Health Coming in 3 x week.   Functional Questionnaire: (I = Independent and D = Dependent) ADL's: I with assistance Cane/walker  Bathing/Dressing- I with assistance Cane/Walker   Meal Prep- D  Eating- I  Maintaining continence- I  Transferring/Ambulation- I with assistance Cane/Walker  Managing Meds- I   Follow up appointments reviewed:    PCP Hospital f/u appt confirmed? Yes  Scheduled to several Dr's Does not want to schedule a TOC at this point, stated she is very overwhelmed with all the appointments. Marland Kitchen  Specialist Hospital f/u appt confirmed? Yes    Are transportation arrangements needed? No   If their condition worsens, is the pt aware to call  their PCP or go to the ED? Yes  Was the patient provided with contact information for the PCP's office or ED? Yes  Was the pt encouraged to call back with questions or concerns? Yes

## 2019-07-25 NOTE — Telephone Encounter (Signed)
I have made the 2nd attempt to contact the patient or family member in charge, in order to follow up from recently being discharged from the hospital. I left a message on voicemail but I will make another attempt at a different time.  

## 2019-07-26 ENCOUNTER — Telehealth: Payer: Self-pay

## 2019-07-26 NOTE — Telephone Encounter (Signed)
Patient will need a face to face visit to order for home health.

## 2019-07-26 NOTE — Telephone Encounter (Signed)
Called Deridia back and informed her of what Dinah had said and she said thank you I will notified the client

## 2019-07-26 NOTE — Telephone Encounter (Signed)
Deridia from [facility] called to get a verbal order for patient to receive speech therapy as follows one time a day for 1 wk, 2 X wk  3  wks, and then 1 time a wk  For 1 wk  Please Advise

## 2019-07-29 ENCOUNTER — Emergency Department (HOSPITAL_COMMUNITY): Payer: Medicare HMO

## 2019-07-29 ENCOUNTER — Telehealth: Payer: Self-pay

## 2019-07-29 ENCOUNTER — Emergency Department (HOSPITAL_COMMUNITY)
Admission: EM | Admit: 2019-07-29 | Discharge: 2019-07-29 | Disposition: A | Discharge status: Home / Self Care | Payer: Medicare HMO | Attending: Emergency Medicine | Admitting: Emergency Medicine

## 2019-07-29 DIAGNOSIS — S0990XA Unspecified injury of head, initial encounter: Secondary | ICD-10-CM | POA: Insufficient documentation

## 2019-07-29 DIAGNOSIS — F17210 Nicotine dependence, cigarettes, uncomplicated: Secondary | ICD-10-CM | POA: Diagnosis not present

## 2019-07-29 DIAGNOSIS — I119 Hypertensive heart disease without heart failure: Secondary | ICD-10-CM | POA: Diagnosis not present

## 2019-07-29 DIAGNOSIS — S2242XA Multiple fractures of ribs, left side, initial encounter for closed fracture: Secondary | ICD-10-CM | POA: Diagnosis not present

## 2019-07-29 DIAGNOSIS — I2510 Atherosclerotic heart disease of native coronary artery without angina pectoris: Secondary | ICD-10-CM | POA: Diagnosis not present

## 2019-07-29 DIAGNOSIS — S52592D Other fractures of lower end of left radius, subsequent encounter for closed fracture with routine healing: Secondary | ICD-10-CM | POA: Insufficient documentation

## 2019-07-29 DIAGNOSIS — Y93E5 Activity, floor mopping and cleaning: Secondary | ICD-10-CM | POA: Diagnosis not present

## 2019-07-29 DIAGNOSIS — W010XXA Fall on same level from slipping, tripping and stumbling without subsequent striking against object, initial encounter: Secondary | ICD-10-CM | POA: Insufficient documentation

## 2019-07-29 DIAGNOSIS — J449 Chronic obstructive pulmonary disease, unspecified: Secondary | ICD-10-CM | POA: Diagnosis not present

## 2019-07-29 DIAGNOSIS — M542 Cervicalgia: Secondary | ICD-10-CM | POA: Diagnosis not present

## 2019-07-29 DIAGNOSIS — S4992XA Unspecified injury of left shoulder and upper arm, initial encounter: Secondary | ICD-10-CM | POA: Diagnosis not present

## 2019-07-29 DIAGNOSIS — R519 Headache, unspecified: Secondary | ICD-10-CM | POA: Diagnosis not present

## 2019-07-29 DIAGNOSIS — S6992XA Unspecified injury of left wrist, hand and finger(s), initial encounter: Secondary | ICD-10-CM | POA: Diagnosis present

## 2019-07-29 DIAGNOSIS — R0789 Other chest pain: Secondary | ICD-10-CM | POA: Diagnosis not present

## 2019-07-29 DIAGNOSIS — Z9889 Other specified postprocedural states: Secondary | ICD-10-CM | POA: Insufficient documentation

## 2019-07-29 DIAGNOSIS — M25512 Pain in left shoulder: Secondary | ICD-10-CM | POA: Diagnosis not present

## 2019-07-29 DIAGNOSIS — Z951 Presence of aortocoronary bypass graft: Secondary | ICD-10-CM | POA: Insufficient documentation

## 2019-07-29 DIAGNOSIS — Y999 Unspecified external cause status: Secondary | ICD-10-CM | POA: Insufficient documentation

## 2019-07-29 DIAGNOSIS — Y929 Unspecified place or not applicable: Secondary | ICD-10-CM | POA: Diagnosis not present

## 2019-07-29 DIAGNOSIS — S52502S Unspecified fracture of the lower end of left radius, sequela: Secondary | ICD-10-CM

## 2019-07-29 MED ORDER — OXYCODONE HCL 5 MG PO TABS: 5.0000 mg | ORAL_TABLET | ORAL | 0 refills | Status: DC | PRN

## 2019-07-29 MED ORDER — OXYCODONE-ACETAMINOPHEN 5-325 MG PO TABS
2.0000 | ORAL_TABLET | Freq: Once | ORAL | Status: AC
  Administered 2019-07-29: 2 via ORAL
  Filled 2019-07-29: qty 2

## 2019-07-29 NOTE — ED Triage Notes (Signed)
Pt bib ems from home after mechanical fall in which she hurt her L wrist. +swelling to wrist. Pt also c/o L shoulder pain. VSS with ems.

## 2019-07-29 NOTE — ED Provider Notes (Signed)
[facility] EMERGENCY DEPARTMENT Provider Note   CSN: [id number] Arrival date & time: 07/29/19  1549     History Chief Complaint  Patient presents with  . Fall    Brittany [name] is a 64 y.o. female hx of COPD, dementia, recent CABG, here presenting with fall, left wrist pain and shoulder pain.  Patient states that she was recently admitted and had a CABG done.  She states that she was cleaning the door handle and had a mechanical fall and fell onto her left shoulder and wrist.  She also hit her head as well.  She states that she had chest pain after her CABG that is constant and as has not changed.  Denies any worsening chest pain prior to the fall.  Denies passing out.  States that she is not currently on any blood thinners.  The history is provided by the patient.       Past Medical History:  Diagnosis Date  . Alzheimer disease (HCC)   . Anxiety   . Bilateral pneumonia 01/2017   per new patient packet   . COPD (chronic obstructive pulmonary disease) (HCC) 01/2017   per new patient packet   . Coronary artery disease   . Depression   . Fatty liver   . Gallbladder disease 2014   per new patient packet   . Hernia, abdominal   . Hypertension   . Liver damage 01/2017   per new patient packet, Dr.Kadakia  . Normal cardiac stress test 2007  . Ovarian cyst 1977   8 1/2 lb left ovarian cyst, Dr.Cox, per new patient packet   . Pneumonia   . Right ovarian cyst    1992, 1993, 1994, 1995, and 1996 Dr.Neil, per new patient packet   . Stroke (HCC)    4 strokes.     Patient Active Problem List   Diagnosis Date Noted  . H/O aortic arch repair 07/19/2019  . Pseudoaneurysm of aorta (HCC)   . Hypertensive urgency   . Aortic aneurysm (HCC) 07/11/2019  . Hypertensive emergency   . Acute coronary syndrome (HCC) 05/28/2019  . Thoracic aortic aneurysm, ruptured (HCC) 05/11/2019  . Cerebrovascular accident (CVA) (HCC) 02/19/2019  . Lacunar infarct, acute (HCC)  01/05/2019  . Altered mental status associated with intoxication (HCC) 01/05/2019  . Hypotension 01/05/2019  . Left wrist fracture 01/05/2019  . AKI (acute kidney injury) (HCC) 01/05/2019  . Restless leg syndrome 07/10/2018  . Peripheral neuropathy 07/10/2018  . COPD (chronic obstructive pulmonary disease) (HCC) 10/05/2017  . GERD (gastroesophageal reflux disease) 10/05/2017  . Depression 10/05/2017  . Facial cellulitis-left 10/05/2017  . Diarrhea 10/05/2017  . Chronic viral hepatitis C (HCC) 06/20/2017  . Idiopathic peripheral neuropathy 05/19/2017  . Chronic low back pain without sciatica 05/19/2017  . Hyperglycemia 05/19/2017  . Chronic bronchitis (HCC) 03/08/2017  . Elevated LFTs 03/08/2017  . 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 GAD (generalized anxiety disorder) 03/08/2017  . Hepatomegaly 03/08/2017  . Acute exacerbation of chronic obstructive pulmonary disease (COPD) (HCC) 02/16/2017  . Acute respiratory failure with hypoxia (HCC)   . COPD with acute exacerbation (HCC) 01/27/2017  . CAP (community acquired pneumonia) 01/27/2017  . Tachycardia 01/27/2017  . COPD exacerbation (HCC) 01/27/2017  . Hypokalemia 01/27/2017  . Left arm numbness 11/28/2016    Class: Chronic  . Nondisplaced transverse fracture of left patella, initial encounter for closed fracture 06/10/2016  . Malnutrition of moderate degree (HCC) 07/12/2013  .  Syncope 07/11/2013  . GASTROENTERITIS 05/07/2010  . TOBACCO ABUSE 07/01/2009  . Benign essential HTN 06/24/2009  . BRONCHITIS, ACUTE 06/24/2009    Past Surgical History:  Procedure Laterality Date  . ABDOMINAL HYSTERECTOMY Bilateral    Total  . CESAREAN SECTION    . CHOLECYSTECTOMY    . INGUINAL HERNIA REPAIR Right   . LEFT OOPHORECTOMY  1977   with cyst removal, age [age]  . MINOR EXCISION OF ORAL LESION N/A 10/07/2017   Procedure: IRRIGATION AND DEBRIDEMENT OF ORAL INFECTION, REMOVAL OF  REMAINING 8 TEETH AND PLACEMENT OF PENROSE DRAINS;  Surgeon: Vivia Ewingrab, Justin, DMD;  Location: [facility] ORS;  Service: Oral Surgery;  Laterality: N/A;  . OVARIAN CYST REMOVAL Right 93, 94,95, 96  . REPLACEMENT ASCENDING AORTA N/A 07/15/2019   Procedure: TOTAL AORTIC ARCH REPLACEMENT USING HEMASHIELD PLATINUM GRAFT SIZE 28MM;  Surgeon: Linden DolinAtkins, Broadus Z, MD;  [facility];  Service: Open Heart Surgery;  Laterality: N/A;  . TEE WITHOUT CARDIOVERSION N/A 07/15/2019   Procedure: TRANSESOPHAGEAL ECHOCARDIOGRAM (TEE);  Surgeon: Linden DolinAtkins, Broadus Z, MD;  [facility];  Service: Open Heart Surgery;  Laterality: N/A;  . THORACIC AORTIC ENDOVASCULAR STENT GRAFT N/A 05/11/2019   Procedure: THORACIC AORTIC ENDOVASCULAR STENT GRAFT, RETROPERITONEAL CONDUIT RIGHT ILIAC ARTERY;  Surgeon: Chuck Hintickson, Christopher S, MD;  [facility];  Service: Vascular;  Laterality: N/A;  . THORACIC AORTIC ENDOVASCULAR STENT GRAFT N/A 07/15/2019   Procedure: Thoracic Aortic Endovascular Repair of Distal Thoracic Pseudoaneurysm ( Stent Graft) - Using GORE TAG Conformable Thoracic Stent Graft - Size 26MM;  Surgeon: Linden DolinAtkins, Broadus Z, MD;  [facility];  Service: Open Heart Surgery;  Laterality: N/A;  . UMBILICAL HERNIA REPAIR       OB History   No obstetric history on file.     Family History  Problem Relation Age of Onset  . Stroke Mother   . COPD Mother   . Dementia Mother   . Breast cancer Mother   . Diabetes Father   . Heart attack Father   . Diabetes Sister   . Stroke Sister        x2  . Hepatitis C Daughter   . Diabetes Paternal Grandmother     Social History   Tobacco Use  . Smoking status: Current Every Day Smoker    Packs/day: 0.75    Years: 46.00    Pack years: 34.50    Types: Cigarettes  . Smokeless tobacco: Never Used  Substance Use Topics  . Alcohol use: Yes    Alcohol/week: 1.0 standard drinks    Types: 1 Cans of beer per week    Comment: once or twice a year.   . Drug use: Yes    Types: Cocaine      Comment: + cocaine on admit    Home Medications Prior to Admission medications   Medication Sig Start Date End Date Taking? Authorizing Provider  albuterol (PROVENTIL HFA;VENTOLIN HFA) 108 (90 Base) MCG/ACT inhaler Inhale 2 puffs into the lungs every 6 (six) hours as needed for wheezing or shortness of breath.    [provider]  alendronate (FOSAMAX) 70 MG tablet Take with a full glass of water on an empty stomach. Patient taking differently: Take 70 mg by mouth every Wednesday.  05/06/19   Ngetich, Dinah C, NP  aspirin EC 81 MG EC tablet Take 1 tablet (81 mg total) by mouth daily. 01/07/19   Jae DireSegal, Jared E, MD  atorvastatin (LIPITOR) 10 MG tablet Take 1 tablet (10  mg total) by mouth daily at 6 PM. 05/15/19   Orpah Cobb, MD  budesonide-formoterol [facility]) 160-4.5 MCG/ACT inhaler Inhale 2 puffs into the lungs daily as needed (for flares).     [provider]  citalopram (CELEXA) 40 MG tablet Take 0.5 tablets (20 mg total) by mouth daily for 30 days. 05/04/18 05/28/28  Ngetich, Dinah C, NP  docusate sodium (COLACE) 100 MG capsule Take 1 capsule (100 mg total) by mouth daily. Patient not taking: Reported on 07/11/2019 05/15/19   Orpah Cobb, MD  furosemide (LASIX) 40 MG tablet Take 1 tablet (40 mg total) by mouth daily. 07/24/19   Barrett, Erin R, PA-C  gabapentin (NEURONTIN) 300 MG capsule Take 2 capsules (600 mg total) by mouth 2 (two) times daily. Take 600 mg by mouth in the morning and 600 mg at bedtime Patient taking differently: Take 900 mg by mouth in the morning and at bedtime.  02/19/19   Levert Feinstein, MD  guaiFENesin-dextromethorphan (ROBITUSSIN DM) 100-10 MG/5ML syrup Take 15 mLs by mouth every 4 (four) hours as needed for cough. Patient not taking: Reported on 07/11/2019 05/15/19   Orpah Cobb, MD  metFORMIN (GLUCOPHAGE) 500 MG tablet Take 0.5 tablets (250 mg total) by mouth daily with breakfast. 06/02/19   Rinaldo Cloud, MD  metoprolol tartrate (LOPRESSOR) 25 MG  tablet Take 0.5 tablets (12.5 mg total) by mouth 2 (two) times daily. 06/02/19   Rinaldo Cloud, MD  nicotine (NICODERM CQ - DOSED IN MG/24 HOURS) 21 mg/24hr patch Place 1 patch (21 mg total) onto the skin daily. Patient not taking: Reported on 07/11/2019 05/16/19   Orpah Cobb, MD  nitroGLYCERIN (NITROSTAT) 0.4 MG SL tablet Place 1 tablet (0.4 mg total) under the tongue every 5 (five) minutes x 3 doses as needed for chest pain (chest pain). 02/18/17   Orpah Cobb, MD  oxyCODONE (OXY IR/ROXICODONE) 5 MG immediate release tablet Take 1-2 tablets (5-10 mg total) by mouth every 4 (four) hours as needed for severe pain. 07/24/19   Barrett, Erin R, PA-C  pantoprazole (PROTONIX) 40 MG tablet Take 1 tablet (40 mg total) by mouth daily. 05/16/19   Orpah Cobb, MD  potassium chloride SA (KLOR-CON) 10 MEQ tablet Take 1 tablet (10 mEq total) by mouth daily. 05/15/19   Orpah Cobb, MD    Allergies    Amoxicillin, Clindamycin/lincomycin, Ropinirole, Wellbutrin [bupropion], Sulfamethoxazole, and Sulfonamide derivatives  Review of Systems   Review of Systems  Musculoskeletal:       L wrist and shoulder pain and neck pain   All other systems reviewed and are negative.   Physical Exam Updated Vital Signs BP (!) 144/73 (BP Location: Right Arm)   Pulse 76   Temp 98.3 F (36.8 C) (Oral)   Resp 14   SpO2 99%   Physical Exam Vitals and nursing note reviewed.  HENT:     Head: Normocephalic.     Comments: Tenderness on the left scalp with no obvious scalp hematoma or laceration.    Right Ear: Tympanic membrane normal.     Left Ear: Tympanic membrane normal.     Mouth/Throat:     Mouth: Mucous membranes are moist.  Eyes:     Extraocular Movements: Extraocular movements intact.     Pupils: Pupils are equal, round, and reactive to light.  Neck:     Comments: L paracervical tenderness, no midline tenderness, nl ROM of the neck  Cardiovascular:     Rate and Rhythm: Normal rate and regular rhythm.  Pulses: Normal pulses.     Heart sounds: Normal heart sounds.  Pulmonary:     Effort: Pulmonary effort is normal.     Breath sounds: Normal breath sounds.  Abdominal:     General: Abdomen is flat.     Palpations: Abdomen is soft.  Musculoskeletal:     Comments: Mild swelling of the left wrist but able to range the wrist.  2+ radial pulses.  No obvious hand deformity.  No forearm or elbow or upper arm deformity.  Mild tenderness of the left shoulder with decreased range of motion.  She has a recent sternotomy scar from CABG that is healing well.   Skin:    General: Skin is warm.     Capillary Refill: Capillary refill takes less than 2 seconds.  Neurological:     General: No focal deficit present.     Mental Status: She is alert and oriented to person, place, and time.     Cranial Nerves: No cranial nerve deficit.     Sensory: No sensory deficit.     Motor: No weakness.     Coordination: Coordination normal.  Psychiatric:        Mood and Affect: Mood normal.        Behavior: Behavior normal.     ED Results / Procedures / Treatments   Labs (all labs ordered are listed, but only abnormal results are displayed) Labs Reviewed - No data to display  EKG EKG Interpretation  Date/Time:  Monday Jul 29 2019 16:46:40 EDT Ventricular Rate:  89 PR Interval:    QRS Duration: 76 QT Interval:  403 QTC Calculation: 491 R Axis:   69 Text Interpretation: Sinus rhythm Probable left atrial enlargement Borderline prolonged QT interval No significant change since last tracing Confirmed by Richardean Canal [phone]) on 07/29/2019 5:25:43 PM   Radiology DG Chest 2 View  Result Date: 07/29/2019 CLINICAL DATA:  Recent fall with left-sided pain, initial encounter EXAM: CHEST - 2 VIEW COMPARISON:  07/22/2019 FINDINGS: Cardiac shadow is stable. Aortic stent graft is again seen. Improvement in previously seen left pleural effusion is noted. No pneumothorax or focal infiltrate is seen. No acute bony injury is  noted. IMPRESSION: No active cardiopulmonary disease. Electronically Signed   By: Alcide Clever M.D.   On: 07/29/2019 17:46   DG Wrist Complete Left  Result Date: 07/29/2019 CLINICAL DATA:  Recent fall 1 day ago with wrist pain, initial encounter EXAM: LEFT WRIST - COMPLETE 3+ VIEW COMPARISON:  None. FINDINGS: There are changes consistent with prior distal radial and ulnar fracture with healing particularly within the distal radius. Posterior angulation remains at the fracture site. No new fracture is seen. IMPRESSION: Changes consistent with prior distal radial and ulnar fractures with healing. Electronically Signed   By: Alcide Clever M.D.   On: 07/29/2019 17:43   CT Head Wo Contrast  Result Date: 07/29/2019 CLINICAL DATA:  Head trauma, headache. Spine fracture, cervical, traumatic. Additional history provided: Mechanical fall. EXAM: CT HEAD WITHOUT CONTRAST CT CERVICAL SPINE WITHOUT CONTRAST TECHNIQUE: Multidetector CT imaging of the head and cervical spine was performed following the standard protocol without intravenous contrast. Multiplanar CT image reconstructions of the cervical spine were also generated. COMPARISON:  Brain MRI 01/05/2019, head CT 01/05/2019, CT cervical spine 01/05/2019 FINDINGS: CT HEAD FINDINGS Brain: Redemonstrated chronic cortically based infarcts within the right frontal, parietal and occipital lobes. Unchanged chronic lacunar infarct within the right cerebellum. Ill-defined hypoattenuation within the cerebral white matter is nonspecific, but consistent  with chronic small vessel ischemic disease. Stable, mild generalized parenchymal atrophy. There is no acute intracranial hemorrhage. No acute demarcated cortical infarct is demonstrated. No extra-axial fluid collection. No evidence of intracranial mass. No midline shift. Vascular: No hyperdense vessel.  Atherosclerotic calcifications. Skull: Normal. Negative for fracture or focal lesion. Sinuses/Orbits: Visualized orbits show no  acute finding. Mild ethmoid sinus mucosal thickening. No significant mastoid effusion. CT CERVICAL SPINE FINDINGS Alignment: Reversal of the expected cervical lordosis. Trace C3-C4 and C4-C5 grade 1 anterolisthesis. Skull base and vertebrae: The basion-dental and atlanto-dental intervals are maintained.No evidence of acute fracture to the cervical spine. Soft tissues and spinal canal: No prevertebral fluid or swelling. No visible canal hematoma. Disc levels: Cervical spondylosis. Most notably at C6-C7, there is advanced disc space narrowing with shallow posterior disc osteophyte complex and uncovertebral hypertrophy. Upper chest: There is an acute, displaced fracture of the posterior left first rib (series 5, image 68). Nondisplaced acute fracture of the posterior left second rib (series 7, images 27 and 28). No consolidation within the imaged lung apices. No visible pneumothorax. IMPRESSION: CT head: 1. No evidence of acute intracranial abnormality. 2. Redemonstrated chronic cortically based infarcts within the right frontal, parietal and occipital lobes. 3. Stable chronic small vessel ischemic changes within the cerebral white matter. Unchanged chronic right cerebellar lacunar infarct. 4. Stable mild generalized parenchymal atrophy. 5. Mild ethmoid sinus mucosal thickening. CT cervical spine: 1. Acute, displaced fracture of the posterior left first rib. Nondisplaced acute fracture of the posterior left second rib. Consider a dedicated chest CT to assess for additional thoracic trauma. 2. No evidence of acute fracture to the cervical spine. 3. Cervical spondylosis, greatest at C6-C7. 4. Minimal C3-C4 and C4-C5 grade 1 anterolisthesis. Electronically Signed   By: Jackey Loge DO   On: 07/29/2019 18:36   CT Cervical Spine Wo Contrast  Result Date: 07/29/2019 CLINICAL DATA:  Head trauma, headache. Spine fracture, cervical, traumatic. Additional history provided: Mechanical fall. EXAM: CT HEAD WITHOUT CONTRAST CT  CERVICAL SPINE WITHOUT CONTRAST TECHNIQUE: Multidetector CT imaging of the head and cervical spine was performed following the standard protocol without intravenous contrast. Multiplanar CT image reconstructions of the cervical spine were also generated. COMPARISON:  Brain MRI 01/05/2019, head CT 01/05/2019, CT cervical spine 01/05/2019 FINDINGS: CT HEAD FINDINGS Brain: Redemonstrated chronic cortically based infarcts within the right frontal, parietal and occipital lobes. Unchanged chronic lacunar infarct within the right cerebellum. Ill-defined hypoattenuation within the cerebral white matter is nonspecific, but consistent with chronic small vessel ischemic disease. Stable, mild generalized parenchymal atrophy. There is no acute intracranial hemorrhage. No acute demarcated cortical infarct is demonstrated. No extra-axial fluid collection. No evidence of intracranial mass. No midline shift. Vascular: No hyperdense vessel.  Atherosclerotic calcifications. Skull: Normal. Negative for fracture or focal lesion. Sinuses/Orbits: Visualized orbits show no acute finding. Mild ethmoid sinus mucosal thickening. No significant mastoid effusion. CT CERVICAL SPINE FINDINGS Alignment: Reversal of the expected cervical lordosis. Trace C3-C4 and C4-C5 grade 1 anterolisthesis. Skull base and vertebrae: The basion-dental and atlanto-dental intervals are maintained.No evidence of acute fracture to the cervical spine. Soft tissues and spinal canal: No prevertebral fluid or swelling. No visible canal hematoma. Disc levels: Cervical spondylosis. Most notably at C6-C7, there is advanced disc space narrowing with shallow posterior disc osteophyte complex and uncovertebral hypertrophy. Upper chest: There is an acute, displaced fracture of the posterior left first rib (series 5, image 68). Nondisplaced acute fracture of the posterior left second rib (series 7, images 27 and 28).  No consolidation within the imaged lung apices. No visible  pneumothorax. IMPRESSION: CT head: 1. No evidence of acute intracranial abnormality. 2. Redemonstrated chronic cortically based infarcts within the right frontal, parietal and occipital lobes. 3. Stable chronic small vessel ischemic changes within the cerebral white matter. Unchanged chronic right cerebellar lacunar infarct. 4. Stable mild generalized parenchymal atrophy. 5. Mild ethmoid sinus mucosal thickening. CT cervical spine: 1. Acute, displaced fracture of the posterior left first rib. Nondisplaced acute fracture of the posterior left second rib. Consider a dedicated chest CT to assess for additional thoracic trauma. 2. No evidence of acute fracture to the cervical spine. 3. Cervical spondylosis, greatest at C6-C7. 4. Minimal C3-C4 and C4-C5 grade 1 anterolisthesis. Electronically Signed   By: Kellie Simmering DO   On: 07/29/2019 18:36   DG Shoulder Left  Result Date: 07/29/2019 CLINICAL DATA:  Recent fall with left shoulder pain, initial encounter EXAM: LEFT SHOULDER - 2+ VIEW COMPARISON:  None. FINDINGS: Degenerative changes of the acromioclavicular joint are noted. No acute fracture or dislocation is seen. Aortic stent graft is noted in the thoracic aorta. Old healed rib fractures on the right are noted as well. No acute fracture or dislocation is noted. IMPRESSION: No acute abnormality noted. Electronically Signed   By: Inez Catalina M.D.   On: 07/29/2019 17:45    Procedures Procedures (including critical care time)  Medications Ordered in ED Medications  oxyCODONE-acetaminophen (PERCOCET/ROXICET) 5-325 MG per tablet 2 tablet (2 tablets Oral Given 07/29/19 1645)    ED Course  I have reviewed the triage vital signs and the nursing notes.  Pertinent labs & imaging results that were available during my care of the patient were reviewed by me and considered in my medical decision making (see chart for details).    MDM Rules/Calculators/A&P                      Brittany [name] is a 64 y.o.  female her presenting with fall with left wrist and shoulder pain.  She also had a head injury as well.  She had recent CABG and had persistent pain since then but adamantly denies passing out or worsening chest pain prior to the fall.  Will get CT head and neck and x-rays of the wrist and shoulder.  Will give oral pain meds.  6:43 PM CT head and neck were unremarkable.  Incidentally, there are 2 rib fractures on the left.  On the chest x-ray there is no pneumothorax. Her pain is controlled with oral oxycodone.  She is also not hypoxic.  She did have old distal radius fracture but no new fractures currently.  Wrist splint placed.  We will have her follow-up with her doctor and prescribe some more oxycodone for pain.  Final Clinical Impression(s) / ED Diagnoses Final diagnoses:  None    Rx / DC Orders ED Discharge Orders    None       Drenda Freeze, MD 07/29/19 1845

## 2019-07-29 NOTE — Telephone Encounter (Signed)
Deirdre from [facility] was doing her home health visit with patient and Faustina had a fall before she got to the home. She is having wrist and leg pain, no laceration or blood , but a possible broken bone. Deirdre  is calling  EMS to access the scene.  Patient just had open heart surgery recently.

## 2019-07-29 NOTE — ED Notes (Signed)
Patient Alert and oriented to baseline. Stable and ambulatory to baseline. Patient verbalized understanding of the discharge instructions.  Patient belongings were taken by the patient.   

## 2019-07-29 NOTE — Telephone Encounter (Signed)
I agree with Home health Nurse to call EMS for evaluation of fall.

## 2019-07-29 NOTE — Progress Notes (Signed)
Orthopedic Tech Progress Note Patient Details:  Brittany [name] September 18, 1955 [id number]  Ortho Devices Type of Ortho Device: Velcro wrist splint Ortho Device/Splint Location: LUE Ortho Device/Splint Interventions: Ordered, Application   Post Interventions Patient Tolerated: Well Instructions Provided: Care of device   [name] 07/29/2019, 7:20 PM

## 2019-07-29 NOTE — Discharge Instructions (Addendum)
You have old wrist fracture. Use splint for comfort. If you have persistent pain, then get follow up xray with your doctor   You do have several rib fractures. Take oxycodone for pain   Use incentive spirometer once an hour while awake for several days to prevent infection   See your doctor in 2-3 days   Return to ER if you have worse wrist pain, chest pain, trouble breathing

## 2019-07-30 ENCOUNTER — Telehealth (HOSPITAL_COMMUNITY): Payer: Self-pay | Admitting: Licensed Clinical Social Worker

## 2019-07-30 NOTE — Telephone Encounter (Signed)
CSW informed that pt has been scheduled for appt to pick out her bed with [facility] and just needs delivery fee paid.  CSW paid for delivery fee so no barriers left for pt- believe appt was scheduled for May 25th.  CSW will continue to follow and assist as needed  Burna Sis, LCSW Clinical Social Worker [facility] Desk#: [phone] Cell#: [phone]

## 2019-08-05 ENCOUNTER — Other Ambulatory Visit: Payer: Self-pay | Admitting: Cardiothoracic Surgery

## 2019-08-05 ENCOUNTER — Encounter: Payer: Self-pay | Admitting: *Deleted

## 2019-08-05 ENCOUNTER — Encounter: Payer: Medicare HMO | Admitting: Family

## 2019-08-05 ENCOUNTER — Other Ambulatory Visit: Payer: Self-pay

## 2019-08-05 ENCOUNTER — Ambulatory Visit
Admission: RE | Admit: 2019-08-05 | Discharge: 2019-08-05 | Disposition: A | Discharge status: Home / Self Care | Payer: Medicare HMO | Source: Ambulatory Visit | Attending: Cardiothoracic Surgery | Admitting: Cardiothoracic Surgery

## 2019-08-05 ENCOUNTER — Ambulatory Visit (INDEPENDENT_AMBULATORY_CARE_PROVIDER_SITE_OTHER): Payer: Self-pay | Admitting: Cardiothoracic Surgery

## 2019-08-05 VITALS — BP 135/74 | HR 110 | Temp 97.8°F | Resp 20 | Ht 62.0 in | Wt 94.0 lb

## 2019-08-05 DIAGNOSIS — Z9889 Other specified postprocedural states: Secondary | ICD-10-CM

## 2019-08-05 DIAGNOSIS — I729 Aneurysm of unspecified site: Secondary | ICD-10-CM

## 2019-08-05 DIAGNOSIS — Z09 Encounter for follow-up examination after completed treatment for conditions other than malignant neoplasm: Secondary | ICD-10-CM

## 2019-08-05 MED ORDER — OXYCODONE HCL 5 MG PO TABS: 5.0000 mg | ORAL_TABLET | Freq: Four times a day (QID) | ORAL | 0 refills | Status: DC | PRN

## 2019-08-06 NOTE — Progress Notes (Signed)
[address]       [place] [address]             [phone]     CARDIOTHORACIC SURGERY OFFICE NOTE  Referring Provider is Early, Arvilla Meres, MD Primary Cardiologist is No primary care provider on file. 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 She did well except for left vocal cord dysfunction. Presents for 1st postoperative visit. Had a fall and sustained broken ribs.    Current Outpatient Medications  Medication Sig Dispense Refill  . albuterol (PROVENTIL HFA;VENTOLIN HFA) 108 (90 Base) MCG/ACT inhaler Inhale 2 puffs into the lungs every 6 (six) hours as needed for wheezing or shortness of breath.    Marland Kitchen alendronate (FOSAMAX) 70 MG tablet Take with a full glass of water on an empty stomach. (Patient taking differently: Take 70 mg by mouth every Wednesday. ) 12 tablet 0  . aspirin EC 81 MG EC tablet Take 1 tablet (81 mg total) by mouth daily. 90 tablet 0  . atorvastatin (LIPITOR) 10 MG tablet Take 1 tablet (10 mg total) by mouth daily at 6 PM. 30 tablet 3  . budesonide-formoterol (SYMBICORT) 160-4.5 MCG/ACT inhaler Inhale 2 puffs into the lungs daily as needed (for flares).     . citalopram (CELEXA) 40 MG tablet Take 0.5 tablets (20 mg total) by mouth daily for 30 days. 30 tablet 3  . docusate sodium (COLACE) 100 MG capsule Take 1 capsule (100 mg total) by mouth daily. 30 capsule 1  . furosemide (LASIX) 40 MG tablet Take 1 tablet (40 mg total) by mouth daily. 7 tablet 0  . gabapentin (NEURONTIN) 300 MG capsule Take 2 capsules (600 mg total) by mouth 2 (two) times daily. Take 600 mg by mouth in the morning and 600 mg at bedtime (Patient taking differently: Take 900 mg by mouth in the morning and at bedtime. ) 120 capsule 11  . guaiFENesin-dextromethorphan (ROBITUSSIN DM) 100-10 MG/5ML syrup Take 15 mLs by mouth every 4 (four)  hours as needed for cough. 118 mL 0  . metFORMIN (GLUCOPHAGE) 500 MG tablet Take 0.5 tablets (250 mg total) by mouth daily with breakfast. 30 tablet 3  . metoprolol tartrate (LOPRESSOR) 25 MG tablet Take 0.5 tablets (12.5 mg total) by mouth 2 (two) times daily. 60 tablet 3  . nicotine (NICODERM CQ - DOSED IN MG/24 HOURS) 21 mg/24hr patch Place 1 patch (21 mg total) onto the skin daily. 28 patch 0  . nitroGLYCERIN (NITROSTAT) 0.4 MG SL tablet Place 1 tablet (0.4 mg total) under the tongue every 5 (five) minutes x 3 doses as needed for chest pain (chest pain). 25 tablet 1  . oxyCODONE (ROXICODONE) 5 MG immediate release tablet Take 1 tablet (5 mg total) by mouth every 4 (four) hours as needed for severe pain. 10 tablet 0  . pantoprazole (PROTONIX) 40 MG tablet Take 1 tablet (40 mg total) by mouth daily. 30 tablet 1  . potassium chloride SA (KLOR-CON) 10 MEQ tablet Take 1 tablet (10 mEq total) by mouth daily. 30 tablet 3  . oxyCODONE (ROXICODONE) 5 MG immediate release tablet Take 1 tablet (5 mg total) by mouth every 6 (six) hours as needed for up to 7 days for moderate pain. 20 tablet 0   No current facility-administered medications for this visit.  Physical Exam:   BP 135/74   Pulse (!) 110   Temp 97.8 F (36.6 C) (Skin)   Resp 20   Ht 5\' 2"  (1.575 m)   Wt 42.6 kg   SpO2 100% Comment: RA  BMI 17.19 kg/m   General:  Well-appearing, nad  Chest:   cta  CV:   rrr  Incisions:  Well-healed  Abdomen:  sntnd  Extremities:  No edema  Diagnostic Tests:  CXR with clear lung fields   Impression:  Doing well after total arch replacement for expanding aortic pseudoaneurysm  Plan:  Prescription given for roxicodone #20 F/u in 2-3 weeks with CT angiogram Suggest having vocal cord medialization Encourage soft bra use to reduce tension on sternal incision  I spent in excess of 20 minutes during the conduct of this office consultation and >50% of this time involved direct  face-to-face encounter with the patient for counseling and/or coordination of their care.  Level 2                 10 minutes Level 3                 15 minutes Level 4                 25 minutes Level 5                 40 minutes  B. , MD 08/06/2019 5:24 PM

## 2019-08-07 ENCOUNTER — Other Ambulatory Visit: Payer: Self-pay | Admitting: Otolaryngology

## 2019-08-08 ENCOUNTER — Emergency Department (HOSPITAL_COMMUNITY)
Admission: EM | Admit: 2019-08-08 | Discharge: 2019-08-08 | Disposition: A | Discharge status: Home / Self Care | Payer: Medicare HMO | Source: Home / Self Care

## 2019-08-08 ENCOUNTER — Telehealth: Payer: Self-pay

## 2019-08-08 DIAGNOSIS — M549 Dorsalgia, unspecified: Secondary | ICD-10-CM | POA: Insufficient documentation

## 2019-08-08 DIAGNOSIS — I719 Aortic aneurysm of unspecified site, without rupture: Secondary | ICD-10-CM | POA: Diagnosis not present

## 2019-08-08 DIAGNOSIS — Z5321 Procedure and treatment not carried out due to patient leaving prior to being seen by health care provider: Secondary | ICD-10-CM | POA: Insufficient documentation

## 2019-08-08 DIAGNOSIS — T827XXA Infection and inflammatory reaction due to other cardiac and vascular devices, implants and grafts, initial encounter: Secondary | ICD-10-CM | POA: Diagnosis not present

## 2019-08-08 NOTE — Telephone Encounter (Signed)
Deidra from [facility] called to inform us that Brittany [name] is in pain with the level being 10. She has been this way for a couple of days thinking that it is coming from her recent fall. Please advise?

## 2019-08-08 NOTE — Telephone Encounter (Signed)
Patient is  At the the emergency room  for pain per Brittany [name] at [facility]

## 2019-08-08 NOTE — ED Triage Notes (Signed)
Pt bib [facility] w/ c/o back pain x 5 days. Pt reports 10/10 pain denies urinary symptoms.

## 2019-08-08 NOTE — Telephone Encounter (Signed)
Patient will need in office appointment for evaluation.she has had several ED and Hospital admission and declined TOC follow with PCP.

## 2019-08-11 ENCOUNTER — Other Ambulatory Visit: Payer: Self-pay

## 2019-08-11 ENCOUNTER — Inpatient Hospital Stay (HOSPITAL_COMMUNITY)
Admission: EM | Admit: 2019-08-11 | Discharge: 2019-08-20 | DRG: 314 | Disposition: E | Payer: Medicare HMO | Attending: Student | Admitting: Student

## 2019-08-11 ENCOUNTER — Encounter (HOSPITAL_COMMUNITY): Payer: Self-pay | Admitting: Emergency Medicine

## 2019-08-11 ENCOUNTER — Emergency Department (HOSPITAL_COMMUNITY): Payer: Medicare HMO

## 2019-08-11 DIAGNOSIS — T827XXA Infection and inflammatory reaction due to other cardiac and vascular devices, implants and grafts, initial encounter: Principal | ICD-10-CM | POA: Diagnosis present

## 2019-08-11 DIAGNOSIS — R52 Pain, unspecified: Secondary | ICD-10-CM | POA: Diagnosis not present

## 2019-08-11 DIAGNOSIS — E43 Unspecified severe protein-calorie malnutrition: Secondary | ICD-10-CM | POA: Diagnosis present

## 2019-08-11 DIAGNOSIS — Z888 Allergy status to other drugs, medicaments and biological substances status: Secondary | ICD-10-CM | POA: Diagnosis not present

## 2019-08-11 DIAGNOSIS — Z7983 Long term (current) use of bisphosphonates: Secondary | ICD-10-CM

## 2019-08-11 DIAGNOSIS — G8929 Other chronic pain: Secondary | ICD-10-CM | POA: Diagnosis present

## 2019-08-11 DIAGNOSIS — Z7951 Long term (current) use of inhaled steroids: Secondary | ICD-10-CM

## 2019-08-11 DIAGNOSIS — F17200 Nicotine dependence, unspecified, uncomplicated: Secondary | ICD-10-CM | POA: Diagnosis present

## 2019-08-11 DIAGNOSIS — F0280 Dementia in other diseases classified elsewhere without behavioral disturbance: Secondary | ICD-10-CM | POA: Diagnosis present

## 2019-08-11 DIAGNOSIS — Z87891 Personal history of nicotine dependence: Secondary | ICD-10-CM

## 2019-08-11 DIAGNOSIS — Z952 Presence of prosthetic heart valve: Secondary | ICD-10-CM | POA: Diagnosis not present

## 2019-08-11 DIAGNOSIS — Z7984 Long term (current) use of oral hypoglycemic drugs: Secondary | ICD-10-CM

## 2019-08-11 DIAGNOSIS — F411 Generalized anxiety disorder: Secondary | ICD-10-CM | POA: Diagnosis present

## 2019-08-11 DIAGNOSIS — Z66 Do not resuscitate: Secondary | ICD-10-CM

## 2019-08-11 DIAGNOSIS — E46 Unspecified protein-calorie malnutrition: Secondary | ICD-10-CM | POA: Diagnosis not present

## 2019-08-11 DIAGNOSIS — Z882 Allergy status to sulfonamides status: Secondary | ICD-10-CM | POA: Diagnosis not present

## 2019-08-11 DIAGNOSIS — E875 Hyperkalemia: Secondary | ICD-10-CM | POA: Diagnosis not present

## 2019-08-11 DIAGNOSIS — Z681 Body mass index (BMI) 19 or less, adult: Secondary | ICD-10-CM

## 2019-08-11 DIAGNOSIS — E876 Hypokalemia: Secondary | ICD-10-CM | POA: Diagnosis present

## 2019-08-11 DIAGNOSIS — Y832 Surgical operation with anastomosis, bypass or graft as the cause of abnormal reaction of the patient, or of later complication, without mention of misadventure at the time of the procedure: Secondary | ICD-10-CM | POA: Diagnosis present

## 2019-08-11 DIAGNOSIS — R5381 Other malaise: Secondary | ICD-10-CM | POA: Diagnosis not present

## 2019-08-11 DIAGNOSIS — Z79899 Other long term (current) drug therapy: Secondary | ICD-10-CM

## 2019-08-11 DIAGNOSIS — I712 Thoracic aortic aneurysm, without rupture, unspecified: Secondary | ICD-10-CM

## 2019-08-11 DIAGNOSIS — E871 Hypo-osmolality and hyponatremia: Secondary | ICD-10-CM | POA: Diagnosis present

## 2019-08-11 DIAGNOSIS — Z20822 Contact with and (suspected) exposure to covid-19: Secondary | ICD-10-CM | POA: Diagnosis present

## 2019-08-11 DIAGNOSIS — Z789 Other specified health status: Secondary | ICD-10-CM | POA: Diagnosis not present

## 2019-08-11 DIAGNOSIS — G309 Alzheimer's disease, unspecified: Secondary | ICD-10-CM | POA: Diagnosis present

## 2019-08-11 DIAGNOSIS — I729 Aneurysm of unspecified site: Secondary | ICD-10-CM | POA: Diagnosis present

## 2019-08-11 DIAGNOSIS — F329 Major depressive disorder, single episode, unspecified: Secondary | ICD-10-CM | POA: Diagnosis present

## 2019-08-11 DIAGNOSIS — G894 Chronic pain syndrome: Secondary | ICD-10-CM

## 2019-08-11 DIAGNOSIS — I639 Cerebral infarction, unspecified: Secondary | ICD-10-CM | POA: Diagnosis present

## 2019-08-11 DIAGNOSIS — I719 Aortic aneurysm of unspecified site, without rupture: Secondary | ICD-10-CM | POA: Diagnosis present

## 2019-08-11 DIAGNOSIS — Z7982 Long term (current) use of aspirin: Secondary | ICD-10-CM

## 2019-08-11 DIAGNOSIS — I10 Essential (primary) hypertension: Secondary | ICD-10-CM | POA: Diagnosis present

## 2019-08-11 DIAGNOSIS — I2510 Atherosclerotic heart disease of native coronary artery without angina pectoris: Secondary | ICD-10-CM | POA: Diagnosis present

## 2019-08-11 DIAGNOSIS — Z515 Encounter for palliative care: Secondary | ICD-10-CM

## 2019-08-11 DIAGNOSIS — A419 Sepsis, unspecified organism: Secondary | ICD-10-CM | POA: Diagnosis not present

## 2019-08-11 DIAGNOSIS — R54 Age-related physical debility: Secondary | ICD-10-CM | POA: Diagnosis present

## 2019-08-11 DIAGNOSIS — B182 Chronic viral hepatitis C: Secondary | ICD-10-CM | POA: Diagnosis present

## 2019-08-11 DIAGNOSIS — I776 Arteritis, unspecified: Secondary | ICD-10-CM | POA: Diagnosis not present

## 2019-08-11 DIAGNOSIS — J449 Chronic obstructive pulmonary disease, unspecified: Secondary | ICD-10-CM | POA: Diagnosis present

## 2019-08-11 DIAGNOSIS — Z880 Allergy status to penicillin: Secondary | ICD-10-CM | POA: Diagnosis not present

## 2019-08-11 DIAGNOSIS — F32A Depression, unspecified: Secondary | ICD-10-CM | POA: Diagnosis present

## 2019-08-11 DIAGNOSIS — J4489 Other specified chronic obstructive pulmonary disease: Secondary | ICD-10-CM

## 2019-08-11 DIAGNOSIS — Z8673 Personal history of transient ischemic attack (TIA), and cerebral infarction without residual deficits: Secondary | ICD-10-CM | POA: Diagnosis not present

## 2019-08-11 DIAGNOSIS — Z7189 Other specified counseling: Secondary | ICD-10-CM | POA: Diagnosis not present

## 2019-08-11 LAB — COMPREHENSIVE METABOLIC PANEL
ALT: 13 U/L (ref 0–44)
AST: 14 U/L — ABNORMAL LOW (ref 15–41)
Albumin: 2.2 g/dL — ABNORMAL LOW (ref 3.5–5.0)
Alkaline Phosphatase: 59 U/L (ref 38–126)
Anion gap: 13 (ref 5–15)
BUN: 14 mg/dL (ref 8–23)
CO2: 25 mmol/L (ref 22–32)
Calcium: 8.1 mg/dL — ABNORMAL LOW (ref 8.9–10.3)
Chloride: 96 mmol/L — ABNORMAL LOW (ref 98–111)
Creatinine, Ser: 0.6 mg/dL (ref 0.44–1.00)
GFR calc Af Amer: >60 mL/min (ref >60)
GFR calc non Af Amer: >60 mL/min (ref >60)
Glucose, Bld: 95 mg/dL (ref 70–99)
Potassium: 2.5 mmol/L — CL (ref 3.5–5.1)
Sodium: 134 mmol/L — ABNORMAL LOW (ref 135–145)
Total Bilirubin: 1 mg/dL (ref 0.3–1.2)
Total Protein: 5.8 g/dL — ABNORMAL LOW (ref 6.5–8.1)

## 2019-08-11 LAB — CBC
HCT: 27.3 % — ABNORMAL LOW (ref 36.0–46.0)
Hemoglobin: 8.6 g/dL — ABNORMAL LOW (ref 12.0–15.0)
MCH: 25.2 pg — ABNORMAL LOW (ref 26.0–34.0)
MCHC: 31.5 g/dL (ref 30.0–36.0)
MCV: 80.1 fL (ref 80.0–100.0)
Platelets: 399 10*3/uL (ref 150–400)
RBC: 3.41 MIL/uL — ABNORMAL LOW (ref 3.87–5.11)
RDW: 19.6 % — ABNORMAL HIGH (ref 11.5–15.5)
WBC: 22.5 10*3/uL — ABNORMAL HIGH (ref 4.0–10.5)
nRBC: 0 % (ref 0.0–0.2)

## 2019-08-11 LAB — LACTIC ACID, PLASMA: Lactic Acid, Venous: 1.3 mmol/L (ref 0.5–1.9)

## 2019-08-11 LAB — TROPONIN I (HIGH SENSITIVITY)
Troponin I (High Sensitivity): 14 ng/L (ref <18)
Troponin I (High Sensitivity): 13 ng/L (ref <18)

## 2019-08-11 LAB — SARS CORONAVIRUS 2 BY RT PCR (HOSPITAL ORDER, PERFORMED IN ~~LOC~~ HOSPITAL LAB): SARS Coronavirus 2: NEGATIVE

## 2019-08-11 LAB — LIPASE, BLOOD: Lipase: 13 U/L (ref 11–51)

## 2019-08-11 MED ORDER — FENTANYL CITRATE (PF) 100 MCG/2ML IJ SOLN
50.0000 ug | Freq: Once | INTRAMUSCULAR | Status: AC
  Administered 2019-08-11: 50 ug via INTRAVENOUS
  Filled 2019-08-11: qty 2

## 2019-08-11 MED ORDER — ONDANSETRON HCL 4 MG/2ML IJ SOLN
4.0000 mg | Freq: Once | INTRAMUSCULAR | Status: AC
  Administered 2019-08-11: 4 mg via INTRAVENOUS
  Filled 2019-08-11: qty 2

## 2019-08-11 MED ORDER — METRONIDAZOLE IN NACL 5-0.79 MG/ML-% IV SOLN
500.0000 mg | Freq: Once | INTRAVENOUS | Status: AC
  Administered 2019-08-12: 500 mg via INTRAVENOUS
  Filled 2019-08-11: qty 100

## 2019-08-11 MED ORDER — METRONIDAZOLE IN NACL 5-0.79 MG/ML-% IV SOLN
500.0000 mg | Freq: Three times a day (TID) | INTRAVENOUS | Status: DC
  Administered 2019-08-12 – 2019-08-14 (×7): 500 mg via INTRAVENOUS
  Filled 2019-08-11 (×7): qty 100

## 2019-08-11 MED ORDER — DOCUSATE SODIUM 100 MG PO CAPS: 100.0000 mg | ORAL_CAPSULE | Freq: Two times a day (BID) | ORAL | Status: DC | PRN

## 2019-08-11 MED ORDER — METOCLOPRAMIDE HCL 5 MG/ML IJ SOLN
10.0000 mg | Freq: Once | INTRAMUSCULAR | Status: AC
  Administered 2019-08-11: 10 mg via INTRAVENOUS
  Filled 2019-08-11: qty 2

## 2019-08-11 MED ORDER — IOHEXOL 350 MG/ML SOLN
100.0000 mL | Freq: Once | INTRAVENOUS | Status: AC | PRN
  Administered 2019-08-11: 92 mL via INTRAVENOUS

## 2019-08-11 MED ORDER — VANCOMYCIN HCL IN DEXTROSE 1-5 GM/200ML-% IV SOLN
1000.0000 mg | Freq: Once | INTRAVENOUS | Status: AC
  Administered 2019-08-12: 1000 mg via INTRAVENOUS
  Filled 2019-08-11: qty 200

## 2019-08-11 MED ORDER — SODIUM CHLORIDE 0.9 % IV SOLN
2.0000 g | Freq: Once | INTRAVENOUS | Status: AC
  Administered 2019-08-11: 2 g via INTRAVENOUS
  Filled 2019-08-11: qty 2

## 2019-08-11 MED ORDER — MORPHINE SULFATE (PF) 4 MG/ML IV SOLN
4.0000 mg | Freq: Once | INTRAVENOUS | Status: AC
  Administered 2019-08-11: 4 mg via INTRAVENOUS
  Filled 2019-08-11: qty 1

## 2019-08-11 MED ORDER — HYDROMORPHONE HCL 1 MG/ML IJ SOLN
1.0000 mg | Freq: Once | INTRAMUSCULAR | Status: AC
  Administered 2019-08-11: 1 mg via INTRAVENOUS
  Filled 2019-08-11: qty 1

## 2019-08-11 MED ORDER — SODIUM CHLORIDE 0.9% FLUSH: 3.0000 mL | Freq: Once | INTRAVENOUS | Status: DC

## 2019-08-11 MED ORDER — POTASSIUM CHLORIDE 10 MEQ/100ML IV SOLN
10.0000 meq | INTRAVENOUS | Status: AC
  Administered 2019-08-11 – 2019-08-12 (×3): 10 meq via INTRAVENOUS
  Filled 2019-08-11 (×3): qty 100

## 2019-08-11 MED ORDER — POLYETHYLENE GLYCOL 3350 17 G PO PACK: 17.0000 g | PACK | Freq: Every day | ORAL | Status: DC | PRN

## 2019-08-11 NOTE — ED Provider Notes (Signed)
[facility] EMERGENCY DEPARTMENT Provider Note   CSN: [id number] Arrival date & time: 08/14/2019  1805     History  Chief Complaint  Patient presents with  . Flank Pain    Brittany [name] is a 64 y.o. female the past medical history of COPD, dementia, recent CABG.  Patient is also status post thoracic aortic endarterectomy after rupture in February 2021, she had an aortic arch replacement after developing pseudoaneurysm. She was seen in the emergency department on 07/29/2019 after fall at home and had a left wrist fracture and left rib fractures.  She presents the emergency department for chest and back pain.  She complains of pain around the entire area of her diaphragm radiating into her back. She states the pain is constant, has been there for about 5 days.  It is different from the rib pain she has had previously.  She denies urinary symptoms, frequency, urgency.  She denies nausea or vomiting.  She rates her pain at 10 out of 10.  Nothing seems to make it worse or better.  HPI     Past Medical History:  Diagnosis Date  . Alzheimer disease (HCC)   . Anxiety   . Bilateral pneumonia 01/2017   per new patient packet   . COPD (chronic obstructive pulmonary disease) (HCC) 01/2017   per new patient packet   . Coronary artery disease   . Depression   . Fatty liver   . Gallbladder disease 2014   per new patient packet   . Hernia, abdominal   . Hypertension   . Liver damage 01/2017   per new patient packet, Dr.Kadakia  . Normal cardiac stress test 2007  . Ovarian cyst 1977   8 1/2 lb left ovarian cyst, Dr.Cox, per new patient packet   . Pneumonia   . Right ovarian cyst    1992, 1993, 1994, 1995, and 1996 Dr.Neil, per new patient packet   . Stroke (HCC)    4 strokes.     Patient Active Problem List   Diagnosis Date Noted  . H/O aortic arch repair 07/19/2019  . Pseudoaneurysm of aorta (HCC)   . Hypertensive urgency   . Aortic aneurysm (HCC) 07/11/2019  .  Hypertensive emergency   . Acute coronary syndrome (HCC) 05/28/2019  . Thoracic aortic aneurysm, ruptured (HCC) 05/11/2019  . Cerebrovascular accident (CVA) (HCC) 02/19/2019  . Lacunar infarct, acute (HCC) 01/05/2019  . Altered mental status associated with intoxication (HCC) 01/05/2019  . Hypotension 01/05/2019  . Left wrist fracture 01/05/2019  . AKI (acute kidney injury) (HCC) 01/05/2019  . Restless leg syndrome 07/10/2018  . Peripheral neuropathy 07/10/2018  . COPD (chronic obstructive pulmonary disease) (HCC) 10/05/2017  . GERD (gastroesophageal reflux disease) 10/05/2017  . Depression 10/05/2017  . Facial cellulitis-left 10/05/2017  . Diarrhea 10/05/2017  . Chronic viral hepatitis C (HCC) 06/20/2017  . Idiopathic peripheral neuropathy 05/19/2017  . Chronic low back pain without sciatica 05/19/2017  . Hyperglycemia 05/19/2017  . Chronic bronchitis (HCC) 03/08/2017  . Elevated LFTs 03/08/2017  . 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 GAD (generalized anxiety disorder) 03/08/2017  . Hepatomegaly 03/08/2017  . Acute exacerbation of chronic obstructive pulmonary disease (COPD) (HCC) 02/16/2017  . Acute respiratory failure with hypoxia (HCC)   . COPD with acute exacerbation (HCC) 01/27/2017  . CAP (community acquired pneumonia) 01/27/2017  . Tachycardia 01/27/2017  . COPD exacerbation (HCC) 01/27/2017  . Hypokalemia 01/27/2017  . Left arm numbness  11/28/2016    Class: Chronic  . Nondisplaced transverse fracture of left patella, initial encounter for closed fracture 06/10/2016  . Malnutrition of moderate degree (HCC) 07/12/2013  . Syncope 07/11/2013  . GASTROENTERITIS 05/07/2010  . TOBACCO ABUSE 07/01/2009  . Benign essential HTN 06/24/2009  . BRONCHITIS, ACUTE 06/24/2009    Past Surgical History:  Procedure Laterality Date  . ABDOMINAL HYSTERECTOMY Bilateral    Total  . CESAREAN SECTION    . CHOLECYSTECTOMY     . INGUINAL HERNIA REPAIR Right   . LEFT OOPHORECTOMY  1977   with cyst removal, age 51  . MINOR EXCISION OF ORAL LESION N/A 10/07/2017   Procedure: IRRIGATION AND DEBRIDEMENT OF ORAL INFECTION, REMOVAL OF REMAINING 8 TEETH AND PLACEMENT OF PENROSE DRAINS;  Surgeon: Vivia Ewing, DMD;  Location: [facility] ORS;  Service: Oral Surgery;  Laterality: N/A;  . OVARIAN CYST REMOVAL Right 93, 94,95, 96  . REPLACEMENT ASCENDING AORTA N/A 07/15/2019   Procedure: TOTAL AORTIC ARCH REPLACEMENT USING HEMASHIELD PLATINUM GRAFT SIZE ;  Surgeon: Linden Dolin, MD;  Location: [facility] OR;  Service: Open Heart Surgery;  Laterality: N/A;  . TEE WITHOUT CARDIOVERSION N/A 07/15/2019   Procedure: TRANSESOPHAGEAL ECHOCARDIOGRAM (TEE);  Surgeon: Linden Dolin, MD;  Location: [facility] OR;  Service: Open Heart Surgery;  Laterality: N/A;  . THORACIC AORTIC ENDOVASCULAR STENT GRAFT N/A 05/11/2019   Procedure: THORACIC AORTIC ENDOVASCULAR STENT GRAFT, RETROPERITONEAL CONDUIT RIGHT ILIAC ARTERY;  Surgeon: Chuck Hint, MD;  Location: [facility] OR;  Service: Vascular;  Laterality: N/A;  . THORACIC AORTIC ENDOVASCULAR STENT GRAFT N/A 07/15/2019   Procedure: Thoracic Aortic Endovascular Repair of Distal Thoracic Pseudoaneurysm ( Stent Graft) - Using GORE TAG Conformable Thoracic Stent Graft - Size ;  Surgeon: Linden Dolin, MD;  Location[facility] OR;  Service: Open Heart Surgery;  Laterality: N/A;  . UMBILICAL HERNIA REPAIR       OB History   No obstetric history on file.     Family History  Problem Relation Age of Onset  . Stroke Mother   . COPD Mother   . Dementia Mother   . Breast cancer Mother   . Diabetes Father   . Heart attack Father   . Diabetes Sister   . Stroke Sister        x2  . Hepatitis C Daughter   . Diabetes Paternal Grandmother     Social History   Tobacco Use  . Smoking status: Current Every Day Smoker    Packs/day: 0.75    Years: 46.00    Pack years: 34.50    Types: Cigarettes  . Smokeless  tobacco: Never Used  Substance Use Topics  . Alcohol use: Yes    Alcohol/week: 1.0 standard drinks    Types: 1 Cans of beer per week    Comment: once or twice a year.   . Drug use: Yes    Types: Cocaine    Comment: + cocaine on admit    Home Medications Prior to Admission medications   Medication Sig Start Date End Date Taking? Authorizing Provider  albuterol (PROVENTIL HFA;VENTOLIN HFA) 108 (90 Base) MCG/ACT inhaler Inhale 2 puffs into the lungs every 6 (six) hours as needed for wheezing or shortness of breath.    [provider]  alendronate (FOSAMAX) 70 MG tablet Take with a full glass of water on an empty stomach. Patient taking differently: Take 70 mg by mouth every Wednesday.  05/06/19   Ngetich, Donalee Citrin, NP  aspirin EC 81  MG EC tablet Take 1 tablet (81 mg total) by mouth daily. 01/07/19   Harold Hedge, MD  atorvastatin (LIPITOR) 10 MG tablet Take 1 tablet (10 mg total) by mouth daily at 6 PM. 05/15/19   Dixie Dials, MD  budesonide-formoterol [facility]) 160-4.5 MCG/ACT inhaler Inhale 2 puffs into the lungs daily as needed (for flares).     [provider]  citalopram (CELEXA) 40 MG tablet Take 0.5 tablets (20 mg total) by mouth daily for 30 days. 05/04/18 05/28/28  Ngetich, Dinah C, NP  docusate sodium (COLACE) 100 MG capsule Take 1 capsule (100 mg total) by mouth daily. 05/15/19   Dixie Dials, MD  furosemide (LASIX) 40 MG tablet Take 1 tablet (40 mg total) by mouth daily. 07/24/19   Barrett, Erin R, PA-C  gabapentin (NEURONTIN) 300 MG capsule Take 2 capsules (600 mg total) by mouth 2 (two) times daily. Take 600 mg by mouth in the morning and 600 mg at bedtime Patient taking differently: Take 900 mg by mouth in the morning and at bedtime.  02/19/19   Marcial Pacas, MD  guaiFENesin-dextromethorphan (ROBITUSSIN DM) 100-10 MG/5ML syrup Take 15 mLs by mouth every 4 (four) hours as needed for cough. 05/15/19   Dixie Dials, MD  metFORMIN (GLUCOPHAGE) 500 MG tablet Take 0.5  tablets (250 mg total) by mouth daily with breakfast. 06/02/19   Charolette Forward, MD  metoprolol tartrate (LOPRESSOR) 25 MG tablet Take 0.5 tablets (12.5 mg total) by mouth 2 (two) times daily. 06/02/19   Charolette Forward, MD  nicotine (NICODERM CQ - DOSED IN MG/24 HOURS) 21 mg/24hr patch Place 1 patch (21 mg total) onto the skin daily. 05/16/19   Dixie Dials, MD  nitroGLYCERIN (NITROSTAT) 0.4 MG SL tablet Place 1 tablet (0.4 mg total) under the tongue every 5 (five) minutes x 3 doses as needed for chest pain (chest pain). 02/18/17   Dixie Dials, MD  oxyCODONE (ROXICODONE) 5 MG immediate release tablet Take 1 tablet (5 mg total) by mouth every 4 (four) hours as needed for severe pain. 07/29/19   Drenda Freeze, MD  oxyCODONE (ROXICODONE) 5 MG immediate release tablet Take 1 tablet (5 mg total) by mouth every 6 (six) hours as needed for up to 7 days for moderate pain. 08/05/19 08/12/19  Wonda Olds, MD  pantoprazole (PROTONIX) 40 MG tablet Take 1 tablet (40 mg total) by mouth daily. 05/16/19   Dixie Dials, MD  potassium chloride SA (KLOR-CON) 10 MEQ tablet Take 1 tablet (10 mEq total) by mouth daily. 05/15/19   Dixie Dials, MD    Allergies    Amoxicillin, Clindamycin/lincomycin, Ropinirole, Wellbutrin [bupropion], Sulfamethoxazole, and Sulfonamide derivatives  Review of Systems   Review of Systems Ten systems reviewed and are negative for acute change, except as noted in the HPI.   Physical Exam Updated Vital Signs BP (!) 134/58   Pulse 94   Temp 98.4 F (36.9 C) (Oral)   Resp (!) 29   Ht 5\' 2"  (1.575 m)   Wt 41.7 kg   SpO2 99%   BMI 16.83 kg/m   Physical Exam Vitals and nursing note reviewed.  Constitutional:      General: She is not in acute distress.    Appearance: She is underweight. She is ill-appearing. She is not diaphoretic.  HENT:     Head: Normocephalic and atraumatic.  Eyes:     General: No scleral icterus.    Conjunctiva/sclera: Conjunctivae normal.   Cardiovascular:     Rate and  Rhythm: Normal rate and regular rhythm.     Heart sounds: Normal heart sounds. No murmur. No friction rub. No gallop.   Pulmonary:     Effort: Pulmonary effort is normal. No respiratory distress.     Breath sounds: Normal breath sounds.  Chest:    Abdominal:     General: Bowel sounds are normal. There is no distension.     Palpations: Abdomen is soft. There is no mass.     Tenderness: There is generalized abdominal tenderness. There is no guarding.  Musculoskeletal:     Cervical back: Normal range of motion.  Skin:    General: Skin is warm and dry.  Neurological:     Mental Status: She is alert and oriented to person, place, and time.  Psychiatric:        Behavior: Behavior normal.     ED Results / Procedures / Treatments   Labs (all labs ordered are listed, but only abnormal results are displayed) Labs Reviewed  COMPREHENSIVE METABOLIC PANEL  CBC  URINALYSIS, ROUTINE W REFLEX MICROSCOPIC  LIPASE, BLOOD  TROPONIN I (HIGH SENSITIVITY)    EKG EKG Interpretation  Date/Time:  "Sunday Aug 11 2019 18:27:13 EDT Ventricular Rate:  99 PR Interval:  136 QRS Duration: 72 QT Interval:  396 QTC Calculation: 508 R Axis:   71 Text Interpretation: Normal sinus rhythm Possible Left atrial enlargement Prolonged QT Abnormal ECG No significant change since last tracing Confirmed by Haviland, Julie ([id number]) on 08/03/2019 7:03:53 PM   Radiology No results found.  Procedures .Critical Care Performed by: , , PA-C Authorized by: , , PA-C   Critical care provider statement:    Critical care time (minutes):  70   Critical care time was exclusive of:  Separately billable procedures and treating other patients   Critical care was necessary to treat or prevent imminent or life-threatening deterioration of the following conditions:  Sepsis (infected aortic aneurysm with gas formation and esophageal arotic fistula)   Critical care was  time spent personally by me on the following activities:  Discussions with consultants, evaluation of patient's response to treatment, examination of patient, ordering and performing treatments and interventions, ordering and review of laboratory studies, ordering and review of radiographic studies, pulse oximetry, re-evaluation of patient's condition, obtaining history from patient or surrogate and review of old charts   (including critical care time)  Medications Ordered in ED Medications  sodium chloride flush (NS) 0.9 % injection 3 mL (3 mLs Intravenous Not Given 08/06/2019 1936)    ED Course  I have reviewed the triage vital signs and the nursing notes.  Pertinent labs & imaging results that were available during my care of the patient were reviewed by me and considered in my medical decision making (see chart for details).  Clinical Course as of Aug 10 2329  Sun Aug 11, 2019  2132 WBC(!): 22.5 [AH]  2132 Resp(!): 29 [AH]    Clinical Course User Index [AH] , , PA-C   MDM Rules/Calculators/A&P                      11" :32 PM Patient will be seen by the critical care team.  Dr. Randie Heinzain. To see.  WU:[id number]:chest and back pain VS:   98.4 F (36.9 C) 99 120/61 -- 20 100 %    JY:NWGNFAOHX:History is gathered by patient and EMR, Ivar Drapeob Browning, PA-C who previously managed patient . Previous records obtained and reviewed. DDX:The patient's complaint of chest pain involves an  extensive number of diagnostic and treatment options, and is a complaint that carries with it a high risk of complications, morbidity, and potential mortality. Given the large differential diagnosis, medical decision making is of high complexity.The emergent differential diagnosis of chest pain includes: Acute coronary syndrome, pericarditis, aortic dissection, pulmonary embolism, tension pneumothorax, pneumonia, and esophageal rupture. Other considerations are worsening pseudoanearysm, maediastinits rupture  Labs: I  ordered reviewed and interpreted labs which include CMP which shows significant hypokalemia and mild hypocalcemia, glucose within normal limits urine which shows no evidence of infection, lactic acid is pending, CBC which shows a significant leukocytosis of 22.5 thousand up from 8.12 weeks ago troponin within normal limits Imaging: I ordered and reviewed images which included one-view portable chest x-ray and CT angiogram of the chest abdomen pelvis with contrast. I independently visualized and interpreted all imaging.  I received a call from Dr. Manson Passey about the portable 1 view chest x-ray which showed change in orientation and positioning of the distal thoracic aortic stent graft concerning for potential recurrent aneurysm.  He recommended CT angiogram.  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   EKG: Normal sinus rhythm at a rate of 99 with prolonged QT Consults: Dr. Manson Passey of radiology x2, Dr. Vickey Sages from CT surgery, Dr. Randie Heinz from vascular surgery, Dr. 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  He is critically ill.  He does not appear to be in overt sepsis and has been hemodynamically stable without fever.  She has had severe and uncontrolled pain.  Patient is complicated requiring multiple consult to multiple services.  She is receiving IV antibiotics and blood cultures were drawn here in the emergency department.  Patient will be admitted to the critical care service for further management. Patient disposition: Admit  The patient appears reasonably  stabilized for admission considering the current resources, flow, and capabilities available in the ED at this time, and I doubt any other [facility] requiring further screening and/or treatment in the ED prior to admission.        Final Clinical Impression(s) / ED Diagnoses Final diagnoses:  None    Rx / DC Orders ED Discharge Orders    None       Arthor Captain, PA-C 08/12/19 1143    Jacalyn Lefevre, MD 08/15/19 1801

## 2019-08-11 NOTE — Consult Note (Signed)
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 Particia NearingHaviland MRN #:  [id number]  History of Present Illness: This is a 64 y.o. female recently underwent thoracic endograft in conjunction with aortic arch replacement.  This was done for degeneration of previous thoracic endograft and at the proximal and distal aspects of the previously placed graft.  The initial procedure was done for symptomatic 5.5 cm thoracic aortic aneurysm.  This did require placement of an iliac conduit on the right side.  There was concern for mycotic process but blood cultures have been negative she had a Gram stain performed at the time of surgery that was negative.  She now returns with a approximately 1 week history of worsening back pain and right scapular and left flank location.  She states she does have 20 years of back pain but this is different.  Pain is constant and moderate to severe in nature occasionally rating it 10 out of 10.  She has not found any position or medicine to make it any better.  She denies any fevers or chills.  She has has no emesis bloody or nonbloody.  She denies any blood in her stool.  States that she has been eating soft foods given difficulty with swallowing since the most recent surgery.  She is taking aspirin and statin.  She is not on any anticoagulants.  Past Medical History:  Diagnosis Date  . Alzheimer disease (HCC)   . Anxiety   . Bilateral pneumonia 01/2017   per new patient packet   . COPD (chronic obstructive pulmonary disease) (HCC) 01/2017   per new patient packet   . Coronary artery disease   . Depression   . Fatty liver   . Gallbladder disease 2014   per new patient packet   . Hernia, abdominal   . Hypertension   . Liver damage 01/2017   per new patient packet, Dr.Kadakia  . Normal cardiac stress test 2007  . Ovarian cyst 1977   8 1/2 lb left ovarian cyst, Dr.Cox, per new patient packet   . Pneumonia   . Right ovarian  cyst    1992, 1993, 1994, 1995, and 1996 Dr.Neil, per new patient packet   . Stroke (HCC)    4 strokes.     Past Surgical History:  Procedure Laterality Date  . ABDOMINAL HYSTERECTOMY Bilateral    Total  . CESAREAN SECTION    . CHOLECYSTECTOMY    . INGUINAL HERNIA REPAIR Right   . LEFT OOPHORECTOMY  1977   with cyst removal, age 64  . MINOR EXCISION OF ORAL LESION N/A 10/07/2017   Procedure: IRRIGATION AND DEBRIDEMENT OF ORAL INFECTION, REMOVAL OF REMAINING 8 TEETH AND PLACEMENT OF PENROSE DRAINS;  Surgeon: Vivia Ewingrab, Justin, DMD;  Location: [facility];  Service: Oral Surgery;  Laterality: N/A;  . OVARIAN CYST REMOVAL Right 93, 94,95, 96  . REPLACEMENT ASCENDING AORTA N/A 07/15/2019   Procedure: TOTAL AORTIC ARCH REPLACEMENT USING HEMASHIELD PLATINUM GRAFT SIZE 28MM;  Surgeon: Linden DolinAtkins, Broadus Z, MD;  [facility];  Service: Open Heart Surgery;  Laterality: N/A;  . TEE WITHOUT CARDIOVERSION N/A 07/15/2019   Procedure: TRANSESOPHAGEAL ECHOCARDIOGRAM (TEE);  Surgeon: Linden DolinAtkins, Broadus Z, MD;  [facility];  Service: Open Heart Surgery;  Laterality: N/A;  . THORACIC AORTIC ENDOVASCULAR STENT GRAFT N/A 05/11/2019   Procedure: THORACIC AORTIC ENDOVASCULAR STENT GRAFT, RETROPERITONEAL CONDUIT RIGHT ILIAC ARTERY;  Surgeon: Chuck Hintickson, Christopher S, MD;  Location: [facility] OR;  Service: Vascular;  Laterality: N/A;  . THORACIC AORTIC ENDOVASCULAR STENT GRAFT N/A 07/15/2019   Procedure: Thoracic Aortic Endovascular Repair of Distal Thoracic Pseudoaneurysm ( Stent Graft) - Using GORE TAG Conformable Thoracic Stent Graft - Size ;  Surgeon: Linden Dolin, MD;  Location: [facility] OR;  Service: Open Heart Surgery;  Laterality: N/A;  . UMBILICAL HERNIA REPAIR      Allergies  Allergen Reactions  . Amoxicillin Hives and Rash     Has patient had a PCN reaction causing immediate rash, facial/tongue/throat swelling, SOB or lightheadedness with hypotension: Yes, hives Has patient had a PCN reaction causing severe rash  involving mucus membranes or skin necrosis: No Has patient had a PCN reaction that required hospitalization No Has patient had a PCN reaction occurring within the last 10 years: Yes If all of the above answers are "NO", then may proceed with Cephalosporin use.   . Clindamycin/Lincomycin Diarrhea and Other (See Comments)    Patient states that clindamycin causes diarrhea   . Ropinirole Other (See Comments)    "Made me insane" (per the patient)  . Wellbutrin [Bupropion] Diarrhea and Other (See Comments)    Lightheadedness, also  . Sulfamethoxazole Nausea And Vomiting and Rash  . Sulfonamide Derivatives Nausea And Vomiting and Rash    Prior to Admission medications   Medication Sig Start Date End Date Taking? Authorizing Provider  albuterol (PROVENTIL HFA;VENTOLIN HFA) 108 (90 Base) MCG/ACT inhaler Inhale 2 puffs into the lungs every 6 (six) hours as needed for wheezing or shortness of breath.    [provider]  alendronate (FOSAMAX) 70 MG tablet Take with a full glass of water on an empty stomach. Patient taking differently: Take 70 mg by mouth every Wednesday.  05/06/19   Ngetich, Dinah C, NP  aspirin EC 81 MG EC tablet Take 1 tablet (81 mg total) by mouth daily. 01/07/19   Jae Dire, MD  atorvastatin (LIPITOR) 10 MG tablet Take 1 tablet (10 mg total) by mouth daily at 6 PM. 05/15/19   Orpah Cobb, MD  budesonide-formoterol [employer]) 160-4.5 MCG/ACT inhaler Inhale 2 puffs into the lungs daily as needed (for flares).     [provider]  citalopram (CELEXA) 40 MG tablet Take 0.5 tablets (20 mg total) by mouth daily for 30 days. 05/04/18 05/28/28  Ngetich, Dinah C, NP  docusate sodium (COLACE) 100 MG capsule Take 1 capsule (100 mg total) by mouth daily. 05/15/19   Orpah Cobb, MD  furosemide (LASIX) 40 MG tablet Take 1 tablet (40 mg total) by mouth daily. 07/24/19   Barrett, Erin R, PA-C  gabapentin (NEURONTIN) 300 MG capsule Take 2 capsules (600 mg total) by mouth 2  (two) times daily. Take 600 mg by mouth in the morning and 600 mg at bedtime Patient taking differently: Take 900 mg by mouth in the morning and at bedtime.  02/19/19   Levert Feinstein, MD  guaiFENesin-dextromethorphan (ROBITUSSIN DM) 100-10 MG/5ML syrup Take 15 mLs by mouth every 4 (four) hours as needed for cough. 05/15/19   Orpah Cobb, MD  metFORMIN (GLUCOPHAGE) 500 MG tablet Take 0.5 tablets (250 mg total) by mouth daily with breakfast. 06/02/19   Rinaldo Cloud, MD  metoprolol tartrate (LOPRESSOR) 25 MG tablet Take 0.5 tablets (12.5 mg total) by mouth 2 (two) times daily. 06/02/19   Rinaldo Cloud, MD  nicotine (NICODERM CQ - DOSED IN MG/24 HOURS) 21 mg/24hr patch Place 1 patch (21 mg total) onto the skin daily. 05/16/19   Orpah Cobb, MD  nitroGLYCERIN (NITROSTAT) 0.4 MG SL tablet Place 1 tablet (0.4 mg total) under the tongue every 5 (five) minutes x 3 doses as needed for chest pain (chest pain). 02/18/17   Orpah Cobb, MD  oxyCODONE (ROXICODONE) 5 MG immediate release tablet Take 1 tablet (5 mg total) by mouth every 4 (four) hours as needed for severe pain. 07/29/19   Charlynne Pander, MD  oxyCODONE (ROXICODONE) 5 MG immediate release tablet Take 1 tablet (5 mg total) by mouth every 6 (six) hours as needed for up to 7 days for moderate pain. 08/05/19 08/12/19  Linden Dolin, MD  pantoprazole (PROTONIX) 40 MG tablet Take 1 tablet (40 mg total) by mouth daily. 05/16/19   Orpah Cobb, MD  potassium chloride SA (KLOR-CON) 10 MEQ tablet Take 1 tablet (10 mEq total) by mouth daily. 05/15/19   Orpah Cobb, MD    Social History   Socioeconomic History  . Marital status: Divorced    Spouse name: Not on file  . Number of children: 1  . Years of education: one year of college  . Highest education level: Not on file  Occupational History  . Occupation: disabled  Tobacco Use  . Smoking status: Current Every Day Smoker    Packs/day: 0.75    Years: 46.00    Pack years: 34.50    Types:  Cigarettes  . Smokeless tobacco: Never Used  Substance and Sexual Activity  . Alcohol use: Yes    Alcohol/week: 1.0 standard drinks    Types: 1 Cans of beer per week    Comment: once or twice a year.   . Drug use: Yes    Types: Cocaine    Comment: + cocaine on admit  . Sexual activity: Never    Comment: last partner ex husbanc 8 years ago   Other Topics Concern  . 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      Lives at home with roommates.      Living Will: Yes   DNR: Yes   POA/HPOA: No      Functional Status:   Do you have difficulty bathing or dressing yourself? No   Do you have difficulty preparing food or eating? No   Do you have difficulty managing your medications? No   Do you have difficulty managing your finances? No   Do you have difficulty affording your medications? Yes Inhalers    Social Determinants of Health   Financial Resource Strain:   . Difficulty of Paying Living Expenses:   Food Insecurity:   . Worried About Programme researcher, broadcasting/film/video in the Last Year:   . Barista in the Last Year:   Transportation Needs:   . Freight forwarder (Medical):   Marland Kitchen Lack of Transportation (Non-Medical):   Physical Activity:   . Days of Exercise per Week:   . Minutes of Exercise per Session:   Stress:   . Feeling of Stress :  Social Connections:   . Frequency of Communication with Friends and Family:   . Frequency of Social Gatherings with Friends and Family:   . Attends Religious Services:   . Active Member of Clubs or Organizations:   . Attends Banker Meetings:   Marland Kitchen Marital Status:    Intimate Partner Violence:   . Fear of Current or Ex-Partner:   . Emotionally Abused:   Marland Kitchen Physically Abused:   . Sexually Abused:      Family History  Problem Relation Age of Onset  . Stroke Mother   . COPD Mother   . Dementia Mother   . Breast cancer Mother   . Diabetes Father   . Heart attack Father   . Diabetes Sister   . Stroke Sister        x2  . Hepatitis C Daughter   . Diabetes Paternal Grandmother     ROS: Cardiovascular: []  chest pain/pressure []  palpitations []  SOB lying flat []  DOE []  pain in legs while walking []  pain in legs at rest []  pain in legs at night []  non-healing ulcers []  hx of DVT []  swelling in legs  Pulmonary: []  productive cough []  asthma/wheezing []  home O2  Neurologic: []  weakness in []  arms []  legs []  numbness in []  arms []  legs []  hx of CVA []  mini stroke [] difficulty speaking or slurred speech []  temporary loss of vision in one eye []  dizziness  Hematologic: []  hx of cancer []  bleeding problems []  problems with blood clotting easily  Endocrine:   []  diabetes []  thyroid disease  GI []  vomiting blood []  blood in stool  GU: []  CKD/renal failure []  HD--[]  M/W/F or []  T/T/S []  burning with urination []  blood in urine  Psychiatric: []  anxiety []  depression  Musculoskeletal: [x]  back pain []  joint pain  Integumentary: []  rashes []  ulcers  Constitutional: []  fever []  chills   Physical Examination  Vitals:   05-Sep-2019 2300 Sep 05, 2019 2315  BP: (!) 128/105 (!) 145/76  Pulse: 99 (!) 104  Resp: 14 (!) 22  Temp:    SpO2: 100% 99%   Body mass index is 16.83 kg/m.  General: She appears in no acute distress HENT: WNL, normocephalic Pulmonary: normal non-labored breathing Cardiac: Palpable radial, common femoral, pedal pulses bilaterally Abdomen: Soft, well-healed right lower quadrant incision Extremities: Warm and well-perfused, there is a brace on her left wrist Musculoskeletal: no muscle wasting or  atrophy  Neurologic: A&O X 3; Appropriate Affect ; SENSATION: normal; MOTOR FUNCTION:  moving all extremities equally. Speech is fluent/normal   CBC    Component Value Date/Time   WBC 22.5 (H) 2019-09-05 2053   RBC 3.41 (L) 05-Sep-2019 2053   HGB 8.6 (L) 09/05/2019 2053   HCT 27.3 (L) 2019/09/05 2053   PLT 399 05-Sep-2019 2053   MCV 80.1 2019-09-05 2053   MCH 25.2 (L) 2019/09/05 2053   MCHC 31.5 September 05, 2019 2053   RDW 19.6 (H) 09/05/2019 2053   LYMPHSABS 1.8 07/23/2019 0238   MONOABS 0.8 07/23/2019 0238   EOSABS 0.5 07/23/2019 0238   BASOSABS 0.1 07/23/2019 0238    BMET    Component Value Date/Time   NA 134 (L) 09-05-19 2053   K 2.5 (LL) 09-05-2019 2053   CL 96 (L) 05-Sep-2019 2053   CO2 25 2019-09-05 2053   GLUCOSE 95 09/05/2019 2053   BUN 14 09-05-19 2053   CREATININE 0.60 Sep 05, 2019 2053   CREATININE 0.68 09/20/2017 1030   CALCIUM 8.1 (L) 2019/09/05 2053  GFRNONAA >60 08/07/2019 2053   GFRNONAA 96 09/20/2017 1030   GFRAA >60 08/01/2019 2053   GFRAA 111 09/20/2017 1030    COAGS: Lab Results  Component Value Date   INR 1.5 (H) 07/15/2019   INR 1.2 07/15/2019   INR 1.1 07/11/2019     Non-Invasive Vascular Imaging:   CT IMPRESSION: 1. Abnormal appearance of the distal thoracic aorta at the site of prior endoluminal stent graft repair. There is increased fluid within the excluded aneurysm sac, which has enlarged in the interim. Gas is now identified within this excluded portion of the aneurysm outside the stent graft, consistent with infection. 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 2. No contrast extravasation to suggest active bleeding or aneurysm rupture. No evidence of dissection. 3. Trace bilateral pleural effusions. 4. Stable atherosclerosis throughout the abdominal aorta. No acute intra-abdominal or intrapelvic process.  I reviewed the CT scan as well as all of the CT scan  since her initial presentation.  I agree that there appears to be air and certainly is concerning for infection and fistulous connection between the esophagus.  There is one area concerning for endoleak on the left lateral aspect of the aorta but this also appears to be present in all of her previous CT scans.  ASSESSMENT/PLAN: This is a 64 y.o. female status status post the above procedures.  Now there is concern for infection of the distal thoracic stent with gas surrounding an enlarged aneurysm with concern for fistulous connection between the aneurysm sac and the distal thoracic esophagus.  Patient does not have any evidence of rupture at this time she has had no symptoms of GI bleed.  Certainly if she developed a GI bleed she would need extension of her thoracic stent emergently which would be complicated by her access having previously required a right retroperitoneal exposure and common iliac artery conduit.  I discussed with her the severity of this issue and that her hospital course may be complicated requiring further operation and that she could certainly die from this process.  She demonstrates very good understanding of this.  Given that pain is her issue at this time I have recommended admission to the ICU with antibiotics and we can have a multidisciplinary discussion on how to best move forward with her care in the daylight hours.  I have also discussed this with the patient and she is in agreement.  Laramie Gelles C. Donzetta Matters, MD [facility] Office: [phone] Pager: [phone]

## 2019-08-11 NOTE — ED Triage Notes (Signed)
Pt to triage via EMS from home.  Reports R flank pain and pain between shoulder blades x 2 days.  Pt had fall 2 weeks ago with rib fractures.  C/o nausea and pain with urination.

## 2019-08-11 NOTE — ED Notes (Signed)
IV team at bedside 

## 2019-08-11 NOTE — H&P (Signed)
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  Patient states that she was in the office 5 days ago and was prescribed some pain medicines.  Patient continues to complain of abdominal pain and back pain with no improvement on pain medicine so she came in for further management to the emergency room.   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  Vascular surgery were called and are underway to evaluate the patient.  Patient denies any hematemesis or hemoptysis no GI bleeding.  She denies any fever chills or rigors.  Patient was started on antibiotics in the emergency room.    Objective   Blood pressure (!) 145/76, pulse (!) 104, temperature 98.5 F (36.9 C), temperature source Rectal, resp. rate (!) 22, height 5\' 2"  (1.575 m), weight 41.7 kg, SpO2 99 %.        Intake/Output Summary (Last 24 hours) at 07/31/2019 2357 Last data filed at 07/29/2019 2320 Gross per 24 hour  Intake 100 ml  Output --  Net 100 ml   Filed Weights   07/31/2019 1823  Weight: 41.7 kg    Examination: General: Acutely ill not in distress HENT: Moist Lungs: Clear equal air sounds bilateral no crackles no wheezing Cardiovascular: Normal heart sound no sounds or murmurs Abdomen: Mildly distended with tenderness in the right upper quadrant Extremities: Palpable pulses no lower edema Neuro: Awake alert moving all extremities are 5/5 all over.    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 -Sepsis -Hypokalemia -Hyponatremia  Plan: -Start cefepime vancomycin and Flagyl -Hold antihypertensives patient blood pressure is borderline -Hold aspirin -Admit to the intensive care for closer monitoring -Serial CBC no signs of bleeding -Vascular surgery are coming to evaluate the patient -Replace electrolytes -Pain control with fentanyl as needed -SCDs for DVT prophylaxis  Labs   CBC: Recent Labs  Lab 08/07/2019 2053  WBC 22.5*  HGB 8.6*  HCT 27.3*  MCV 80.1  PLT 536    Basic Metabolic Panel: Recent Labs  Lab 08/07/2019 2053  NA 134*  K 2.5*  CL 96*  CO2 25  GLUCOSE 95  BUN 14  CREATININE 0.60  CALCIUM 8.1*   GFR: Estimated Creatinine Clearance: 48.6 mL/min (by C-G formula based on SCr of 0.6 mg/dL). Recent Labs  Lab 07/30/2019 2053 08/17/2019 2308  WBC 22.5*  --   LATICACIDVEN  --  1.3    Liver Function Tests: Recent Labs  Lab 07/22/2019 2053  AST 14*  ALT 13  ALKPHOS 59  BILITOT 1.0  PROT 5.8*  ALBUMIN 2.2*   Recent Labs  Lab 08/01/2019 2053  LIPASE 13   No results for input(s): AMMONIA in the last 168 hours.  ABG    Component Value Date/Time   PHART 7.428 07/15/2019 2212   PCO2ART 34.2 07/15/2019 2212   PO2ART 73 (L) 07/15/2019 2212   HCO3 22.7 07/15/2019 2212   TCO2 24 07/15/2019 2212   ACIDBASEDEF 1.0 07/15/2019 2212  O2SAT 95.0 07/15/2019 2212     Coagulation Profile: No results for input(s): INR, PROTIME in the last 168 hours.  Cardiac Enzymes: No results for input(s): CKTOTAL, CKMB, CKMBINDEX, TROPONINI in the last 168 hours.  HbA1C: Hgb A1c MFr Bld  Date/Time Value Ref Range Status  05/29/2019 04:40 AM 6.7 (H) 4.8 - 5.6 % Final    Comment:    (NOTE) Pre diabetes:          5.7%-6.4% Diabetes:              >6.4% Glycemic control for   <7.0% adults with diabetes   01/06/2019 06:28 AM 5.8 (H) 4.8 - 5.6 % Final    Comment:    (NOTE)          Prediabetes: 5.7 - 6.4         Diabetes: >6.4         Glycemic control for adults with diabetes: <7.0     CBG: No results for input(s): GLUCAP in the last 168 hours.  Review of Systems:   All 12 point system review were unremarkable other than what is mentioned in interval history  Past Medical History  She,  has a past medical history of Alzheimer disease (HCC), Anxiety, Bilateral pneumonia (01/2017), COPD (chronic obstructive pulmonary disease) (HCC) (01/2017), Coronary artery disease, Depression, Fatty liver, Gallbladder disease (2014), Hernia, abdominal, Hypertension, Liver damage (01/2017), Normal cardiac stress test (2007), Ovarian cyst (1977), Pneumonia, Right ovarian cyst, and Stroke (HCC).   Surgical History    Past Surgical History:  Procedure Laterality Date  . ABDOMINAL HYSTERECTOMY Bilateral    Total  . CESAREAN SECTION    . CHOLECYSTECTOMY    . INGUINAL HERNIA REPAIR Right   . LEFT OOPHORECTOMY  1977   with cyst removal, age 24  . MINOR EXCISION OF ORAL LESION N/A 10/07/2017   Procedure: IRRIGATION AND DEBRIDEMENT OF ORAL INFECTION, REMOVAL OF REMAINING 8 TEETH AND PLACEMENT OF PENROSE DRAINS;  Surgeon: Vivia Ewing, DMD;  Location: [facility] ORS;  Service: Oral Surgery;  Laterality: N/A;  . OVARIAN CYST REMOVAL Right 93, 94,95, 96  . REPLACEMENT ASCENDING AORTA N/A 07/15/2019   Procedure: TOTAL AORTIC ARCH REPLACEMENT USING HEMASHIELD PLATINUM GRAFT SIZE ;  Surgeon: Linden Dolin, MD;  Location: [facility] OR;  Service: Open Heart Surgery;  Laterality: N/A;  . TEE WITHOUT CARDIOVERSION N/A 07/15/2019   Procedure: TRANSESOPHAGEAL ECHOCARDIOGRAM (TEE);  Surgeon: Linden Dolin, MD;  Location: [facility] OR;  Service: Open Heart Surgery;  Laterality: N/A;  . THORACIC AORTIC ENDOVASCULAR STENT GRAFT N/A 05/11/2019   Procedure: THORACIC AORTIC ENDOVASCULAR STENT GRAFT, RETROPERITONEAL CONDUIT RIGHT ILIAC ARTERY;  Surgeon: Chuck Hint, MD;  Location: [facility] OR;  Service: Vascular;   Laterality: N/A;  . THORACIC AORTIC ENDOVASCULAR STENT GRAFT N/A 07/15/2019   Procedure: Thoracic Aortic Endovascular Repair of Distal Thoracic Pseudoaneurysm ( Stent Graft) - Using GORE TAG Conformable Thoracic Stent Graft - Size ;  Surgeon: Linden Dolin, MD;  Location: [facility] OR;  Service: Open Heart Surgery;  Laterality: N/A;  . UMBILICAL HERNIA REPAIR       Social History   reports that she has been smoking cigarettes. She has a 34.50 pack-year smoking history. She has never used smokeless tobacco. She reports current alcohol use of about 1.0 standard drinks of alcohol per week. She reports current drug use. Drug: Cocaine.   Family History   Her family history includes Breast cancer in her mother; COPD in her mother; Dementia in her  mother; Diabetes in her father, paternal grandmother, and sister; Heart attack in her father; Hepatitis C in her daughter; Stroke in her mother and sister.   Allergies Allergies  Allergen Reactions  . Amoxicillin Hives and Rash     Has patient had a PCN reaction causing immediate rash, facial/tongue/throat swelling, SOB or lightheadedness with hypotension: Yes, hives Has patient had a PCN reaction causing severe rash involving mucus membranes or skin necrosis: No Has patient had a PCN reaction that required hospitalization No Has patient had a PCN reaction occurring within the last 10 years: Yes If all of the above answers are "NO", then may proceed with Cephalosporin use.   . Clindamycin/Lincomycin Diarrhea and Other (See Comments)    Patient states that clindamycin causes diarrhea   . Ropinirole Other (See Comments)    "Made me insane" (per the patient)  . Wellbutrin [Bupropion] Diarrhea and Other (See Comments)    Lightheadedness, also  . Sulfamethoxazole Nausea And Vomiting and Rash  . Sulfonamide Derivatives Nausea And Vomiting and Rash     Home Medications  Prior to Admission medications   Medication Sig Start Date End Date Taking?  Authorizing Provider  albuterol (PROVENTIL HFA;VENTOLIN HFA) 108 (90 Base) MCG/ACT inhaler Inhale 2 puffs into the lungs every 6 (six) hours as needed for wheezing or shortness of breath.    [provider]  alendronate (FOSAMAX) 70 MG tablet Take with a full glass of water on an empty stomach. Patient taking differently: Take 70 mg by mouth every Wednesday.  05/06/19   Ngetich, Dinah C, NP  aspirin EC 81 MG EC tablet Take 1 tablet (81 mg total) by mouth daily. 01/07/19   Jae Dire, MD  atorvastatin (LIPITOR) 10 MG tablet Take 1 tablet (10 mg total) by mouth daily at 6 PM. 05/15/19   Orpah Cobb, MD  budesonide-formoterol [facility]) 160-4.5 MCG/ACT inhaler Inhale 2 puffs into the lungs daily as needed (for flares).     [provider]  citalopram (CELEXA) 40 MG tablet Take 0.5 tablets (20 mg total) by mouth daily for 30 days. 05/04/18 05/28/28  Ngetich, Dinah C, NP  docusate sodium (COLACE) 100 MG capsule Take 1 capsule (100 mg total) by mouth daily. 05/15/19   Orpah Cobb, MD  furosemide (LASIX) 40 MG tablet Take 1 tablet (40 mg total) by mouth daily. 07/24/19   Barrett, Erin R, PA-C  gabapentin (NEURONTIN) 300 MG capsule Take 2 capsules (600 mg total) by mouth 2 (two) times daily. Take 600 mg by mouth in the morning and 600 mg at bedtime Patient taking differently: Take 900 mg by mouth in the morning and at bedtime.  02/19/19   Levert Feinstein, MD  guaiFENesin-dextromethorphan (ROBITUSSIN DM) 100-10 MG/5ML syrup Take 15 mLs by mouth every 4 (four) hours as needed for cough. 05/15/19   Orpah Cobb, MD  metFORMIN (GLUCOPHAGE) 500 MG tablet Take 0.5 tablets (250 mg total) by mouth daily with breakfast. 06/02/19   Rinaldo Cloud, MD  metoprolol tartrate (LOPRESSOR) 25 MG tablet Take 0.5 tablets (12.5 mg total) by mouth 2 (two) times daily. 06/02/19   Rinaldo Cloud, MD  nicotine (NICODERM CQ - DOSED IN MG/24 HOURS) 21 mg/24hr patch Place 1 patch (21 mg total) onto the skin daily. 05/16/19    Orpah Cobb, MD  nitroGLYCERIN (NITROSTAT) 0.4 MG SL tablet Place 1 tablet (0.4 mg total) under the tongue every 5 (five) minutes x 3 doses as needed for chest pain (chest pain). 02/18/17   Orpah Cobb,  MD  oxyCODONE (ROXICODONE) 5 MG immediate release tablet Take 1 tablet (5 mg total) by mouth every 4 (four) hours as needed for severe pain. 07/29/19   Charlynne Pander, MD  oxyCODONE (ROXICODONE) 5 MG immediate release tablet Take 1 tablet (5 mg total) by mouth every 6 (six) hours as needed for up to 7 days for moderate pain. 08/05/19 08/12/19  Linden Dolin, MD  pantoprazole (PROTONIX) 40 MG tablet Take 1 tablet (40 mg total) by mouth daily. 05/16/19   Orpah Cobb, MD  potassium chloride SA (KLOR-CON) 10 MEQ tablet Take 1 tablet (10 mEq total) by mouth daily. 05/15/19   Orpah Cobb, MD

## 2019-08-11 NOTE — ED Notes (Signed)
Pt states she is a hard stick, and she has to have ultrasound IV's placed. RN attempted IVx1. Pt requested for IV Team. IV team consult placed

## 2019-08-12 DIAGNOSIS — T827XXA Infection and inflammatory reaction due to other cardiac and vascular devices, implants and grafts, initial encounter: Principal | ICD-10-CM

## 2019-08-12 DIAGNOSIS — A419 Sepsis, unspecified organism: Secondary | ICD-10-CM

## 2019-08-12 DIAGNOSIS — I712 Thoracic aortic aneurysm, without rupture: Secondary | ICD-10-CM

## 2019-08-12 DIAGNOSIS — E876 Hypokalemia: Secondary | ICD-10-CM

## 2019-08-12 LAB — BASIC METABOLIC PANEL
Anion gap: 13 (ref 5–15)
Anion gap: 10 (ref 5–15)
BUN: 10 mg/dL (ref 8–23)
BUN: 13 mg/dL (ref 8–23)
CO2: 20 mmol/L — ABNORMAL LOW (ref 22–32)
CO2: 24 mmol/L (ref 22–32)
Calcium: 8.1 mg/dL — ABNORMAL LOW (ref 8.9–10.3)
Calcium: 8.9 mg/dL (ref 8.9–10.3)
Chloride: 99 mmol/L (ref 98–111)
Chloride: 102 mmol/L (ref 98–111)
Creatinine, Ser: 0.63 mg/dL (ref 0.44–1.00)
Creatinine, Ser: 0.6 mg/dL (ref 0.44–1.00)
GFR calc Af Amer: >60 mL/min (ref >60)
GFR calc Af Amer: >60 mL/min (ref >60)
GFR calc non Af Amer: >60 mL/min (ref >60)
GFR calc non Af Amer: >60 mL/min (ref >60)
Glucose, Bld: 128 mg/dL — ABNORMAL HIGH (ref 70–99)
Glucose, Bld: 84 mg/dL (ref 70–99)
Potassium: 2.8 mmol/L — ABNORMAL LOW (ref 3.5–5.1)
Potassium: 5.3 mmol/L — ABNORMAL HIGH (ref 3.5–5.1)
Sodium: 132 mmol/L — ABNORMAL LOW (ref 135–145)
Sodium: 136 mmol/L (ref 135–145)

## 2019-08-12 LAB — URINALYSIS, ROUTINE W REFLEX MICROSCOPIC
Bacteria, UA: NONE SEEN
Bilirubin Urine: NEGATIVE
Glucose, UA: NEGATIVE mg/dL
Hgb urine dipstick: NEGATIVE
Ketones, ur: 5 mg/dL — AB
Leukocytes,Ua: NEGATIVE
Nitrite: NEGATIVE
Protein, ur: 30 mg/dL — AB
Specific Gravity, Urine: >1.046 — ABNORMAL HIGH (ref 1.005–1.030)
pH: 6 (ref 5.0–8.0)

## 2019-08-12 LAB — CBC WITH DIFFERENTIAL/PLATELET
Abs Immature Granulocytes: 0.19 10*3/uL — ABNORMAL HIGH (ref 0.00–0.07)
Basophils Absolute: 0 10*3/uL (ref 0.0–0.1)
Basophils Relative: 0 %
Eosinophils Absolute: 0.1 10*3/uL (ref 0.0–0.5)
Eosinophils Relative: 0 %
HCT: 28.3 % — ABNORMAL LOW (ref 36.0–46.0)
Hemoglobin: 9.1 g/dL — ABNORMAL LOW (ref 12.0–15.0)
Immature Granulocytes: 1 %
Lymphocytes Relative: 3 %
Lymphs Abs: 0.7 10*3/uL (ref 0.7–4.0)
MCH: 25.6 pg — ABNORMAL LOW (ref 26.0–34.0)
MCHC: 32.2 g/dL (ref 30.0–36.0)
MCV: 79.7 fL — ABNORMAL LOW (ref 80.0–100.0)
Monocytes Absolute: 0.9 10*3/uL (ref 0.1–1.0)
Monocytes Relative: 4 %
Neutro Abs: 22.7 10*3/uL — ABNORMAL HIGH (ref 1.7–7.7)
Neutrophils Relative %: 92 %
Platelets: 414 10*3/uL — ABNORMAL HIGH (ref 150–400)
RBC: 3.55 MIL/uL — ABNORMAL LOW (ref 3.87–5.11)
RDW: 19.6 % — ABNORMAL HIGH (ref 11.5–15.5)
WBC: 24.6 10*3/uL — ABNORMAL HIGH (ref 4.0–10.5)
nRBC: 0 % (ref 0.0–0.2)

## 2019-08-12 LAB — POTASSIUM
Potassium: 6.5 mmol/L (ref 3.5–5.1)
Potassium: 4.1 mmol/L (ref 3.5–5.1)

## 2019-08-12 LAB — PROTIME-INR
INR: 1.3 — ABNORMAL HIGH (ref 0.8–1.2)
Prothrombin Time: 15.8 seconds — ABNORMAL HIGH (ref 11.4–15.2)

## 2019-08-12 LAB — APTT: aPTT: 38 seconds — ABNORMAL HIGH (ref 24–36)

## 2019-08-12 LAB — TROPONIN I (HIGH SENSITIVITY): Troponin I (High Sensitivity): 15 ng/L (ref <18)

## 2019-08-12 LAB — MRSA PCR SCREENING: MRSA by PCR: NEGATIVE

## 2019-08-12 MED ORDER — VANCOMYCIN HCL 500 MG/100ML IV SOLN
500.0000 mg | Freq: Two times a day (BID) | INTRAVENOUS | Status: DC
  Administered 2019-08-12 – 2019-08-17 (×10): 500 mg via INTRAVENOUS
  Filled 2019-08-12 (×12): qty 100

## 2019-08-12 MED ORDER — PANTOPRAZOLE SODIUM 40 MG IV SOLR
40.0000 mg | INTRAVENOUS | Status: DC
  Administered 2019-08-12 – 2019-08-17 (×5): 40 mg via INTRAVENOUS
  Filled 2019-08-12 (×5): qty 40

## 2019-08-12 MED ORDER — INSULIN ASPART 100 UNIT/ML IV SOLN
10.0000 [IU] | Freq: Once | INTRAVENOUS | Status: AC
  Administered 2019-08-12: 10 [IU] via INTRAVENOUS

## 2019-08-12 MED ORDER — CHLORHEXIDINE GLUCONATE CLOTH 2 % EX PADS
6.0000 | MEDICATED_PAD | Freq: Every day | CUTANEOUS | Status: DC
  Administered 2019-08-13: 6 via TOPICAL

## 2019-08-12 MED ORDER — FENTANYL CITRATE (PF) 100 MCG/2ML IJ SOLN
25.0000 ug | INTRAMUSCULAR | Status: DC | PRN
  Administered 2019-08-12 (×3): 25 ug via INTRAVENOUS
  Filled 2019-08-12 (×3): qty 2

## 2019-08-12 MED ORDER — OXYCODONE-ACETAMINOPHEN 5-325 MG PO TABS
1.0000 | ORAL_TABLET | ORAL | Status: DC | PRN
  Administered 2019-08-12 – 2019-08-14 (×12): 1 via ORAL
  Filled 2019-08-12 (×13): qty 1

## 2019-08-12 MED ORDER — CALCIUM GLUCONATE-NACL 1-0.675 GM/50ML-% IV SOLN
1.0000 g | Freq: Once | INTRAVENOUS | Status: AC
  Administered 2019-08-12: 1000 mg via INTRAVENOUS
  Filled 2019-08-12: qty 50

## 2019-08-12 MED ORDER — SODIUM ZIRCONIUM CYCLOSILICATE 10 G PO PACK
10.0000 g | PACK | Freq: Three times a day (TID) | ORAL | Status: AC
  Administered 2019-08-12 (×2): 10 g via ORAL
  Filled 2019-08-12 (×3): qty 1

## 2019-08-12 MED ORDER — POTASSIUM CHLORIDE 20 MEQ/15ML (10%) PO SOLN
40.0000 meq | ORAL | Status: AC
  Administered 2019-08-12 (×3): 40 meq via ORAL
  Filled 2019-08-12 (×3): qty 30

## 2019-08-12 MED ORDER — PANTOPRAZOLE SODIUM 40 MG PO TBEC: 40.0000 mg | DELAYED_RELEASE_TABLET | Freq: Every day | ORAL | Status: DC

## 2019-08-12 MED ORDER — DEXTROSE 50 % IV SOLN
1.0000 | Freq: Once | INTRAVENOUS | Status: AC
  Administered 2019-08-12: 50 mL via INTRAVENOUS
  Filled 2019-08-12: qty 50

## 2019-08-12 MED ORDER — SODIUM CHLORIDE 0.9 % IV SOLN
2.0000 g | Freq: Two times a day (BID) | INTRAVENOUS | Status: DC
  Administered 2019-08-12 – 2019-08-14 (×5): 2 g via INTRAVENOUS
  Filled 2019-08-12 (×7): qty 2

## 2019-08-12 MED ORDER — FENTANYL CITRATE (PF) 100 MCG/2ML IJ SOLN
50.0000 ug | INTRAMUSCULAR | Status: DC | PRN
  Administered 2019-08-12 – 2019-08-14 (×13): 50 ug via INTRAVENOUS
  Filled 2019-08-12 (×14): qty 2

## 2019-08-12 MED ORDER — FUROSEMIDE 10 MG/ML IJ SOLN
40.0000 mg | Freq: Once | INTRAMUSCULAR | Status: AC
  Administered 2019-08-12: 40 mg via INTRAVENOUS
  Filled 2019-08-12: qty 4

## 2019-08-12 MED ORDER — FENTANYL CITRATE (PF) 100 MCG/2ML IJ SOLN
25.0000 ug | INTRAMUSCULAR | Status: DC | PRN
  Administered 2019-08-12: 25 ug via INTRAVENOUS
  Filled 2019-08-12: qty 2

## 2019-08-12 NOTE — Progress Notes (Signed)
eLink Physician-Brief Progress Note Patient Name: Brittany [name] DOB: 1956/02/01 MRN: [id number]   Date of Service  08/12/2019  HPI/Events of Note  64 year old woman with aortic graft infection, admitted to ICU. Has been seen by PCCM and vascular surgery, on antibiotics.   eICU Interventions  Stable on camera Plan per bedside Please call E link if needed Close monitoring and serial labs needed     Intervention Category Major Interventions: Sepsis - evaluation and management Evaluation Type: New Patient Evaluation  Oretha Milch 08/12/2019, 2:36 AM

## 2019-08-12 NOTE — Progress Notes (Signed)
Pharmacy Antibiotic Note  Brittany [name] is a 64 y.o. female admitted on 08/16/2019 with aortoesophageal fistula .  Pharmacy has been consulted for Vancomycin and Cefepime  dosing.  Plan: Vancomycin 500 mg IV q12h Cefepime 2 g IV q12h  Height: 5\' 2"  (157.5 cm) Weight: 41.7 kg (92 lb) IBW/kg (Calculated) : 50.1  Temp (24hrs), Avg:98.5 F (36.9 C), Min:98.4 F (36.9 C), Max:98.5 F (36.9 C)  Recent Labs  Lab 08/01/2019 2053 08/10/2019 2308  WBC 22.5*  --   CREATININE 0.60  --   LATICACIDVEN  --  1.3    Estimated Creatinine Clearance: 48.6 mL/min (by C-G formula based on SCr of 0.6 mg/dL).    Allergies  Allergen Reactions  . Amoxicillin Hives and Rash     Has patient had a PCN reaction causing immediate rash, facial/tongue/throat swelling, SOB or lightheadedness with hypotension: Yes, hives Has patient had a PCN reaction causing severe rash involving mucus membranes or skin necrosis: No Has patient had a PCN reaction that required hospitalization No Has patient had a PCN reaction occurring within the last 10 years: Yes If all of the above answers are "NO", then may proceed with Cephalosporin use.   . Clindamycin/Lincomycin Diarrhea and Other (See Comments)    Patient states that clindamycin causes diarrhea   . Ropinirole Other (See Comments)    "Made me insane" (per the patient)  . Wellbutrin [Bupropion] Diarrhea and Other (See Comments)    Lightheadedness, also  . Sulfamethoxazole Nausea And Vomiting and Rash  . Sulfonamide Derivatives Nausea And Vomiting and Rash     08/13/19 08/12/2019 1:59 AM

## 2019-08-12 NOTE — Progress Notes (Signed)
   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  Patient states that she was in the office 5 days ago and was prescribed some pain medicines.  Patient continues to complain of abdominal pain and back pain with no improvement on pain medicine so she came in for further management to the emergency room.   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  Vascular surgery were called and are underway to evaluate the patient.  Patient denies any hematemesis or hemoptysis no GI bleeding.  She denies any fever chills or rigors.  Patient was started on antibiotics in the emergency room.    Objective   Blood pressure (!) 115/59, pulse (!) 115, temperature 99.7 F (37.6 C), temperature source Oral, resp. rate (!) 35, height 5\' 2"  (1.575 m), weight 43.7 kg, SpO2 92 %.        Intake/Output Summary (Last 24 hours) at 08/12/2019 0803 Last data filed at 07/31/2019 2320 Gross per 24 hour  Intake 100 ml  Output -  Net 100 ml   Filed Weights   07/26/2019 1823 08/12/19 0201  Weight: 41.7 kg 43.7 kg    Examination: GEN: thin frail woman in NAD HEENT: MMM, trachea midline CV: RRR, ext warm PULM: Clear, no wheezing GI: Soft, hypoactive BS EXT: no edema NEURO: moves all 4 ext to command PSYCH: RASS 0 SKIN: No rashes  Hypokalemia persists WBC up  Assessment & Plan:   Aortic graft infection - broad spectrum antibiotics - multidisciplinary surgeon conference  planned this AM, await direction  Sepsis - appears euvolemic, trend WBC/UOP  Hypokalemia - replete  Best practice:  Diet: NPO Pain/Anxiety/Delirium protocol (if indicated): increase today VAP protocol (if indicated): n/a DVT prophylaxis: SCDs given concern for possible developing fistula GI prophylaxis: ppi Glucose control: ssi Mobility: BR Code Status: full Family Communication: updated patient Disposition:  ICU pending surgical plans  08/14/19 MD PCCM

## 2019-08-12 NOTE — Progress Notes (Addendum)
  Progress Note    08/12/2019 7:41 AM * No surgery found *  Subjective:  Back pain persistent despite pain medication.  Patient also endorses pleuritic chest pain.   Vitals:   08/12/19 0500 08/12/19 0610  BP: 123/64 114/60  Pulse: (!) 116 (!) 118  Resp: (!) 29 (!) 28  Temp:    SpO2: 92% 92%   Physical Exam: Cardiac:  Tachycardic Extremities:  Symmetrical DP and PT pulses Abdomen:  Soft, tender to deep palpation diffusely Neurologic: A&O  CBC    Component Value Date/Time   WBC 24.6 (H) 08/12/2019 0617   RBC 3.55 (L) 08/12/2019 0617   HGB 9.1 (L) 08/12/2019 0617   HCT 28.3 (L) 08/12/2019 0617   PLT 414 (H) 08/12/2019 0617   MCV 79.7 (L) 08/12/2019 0617   MCH 25.6 (L) 08/12/2019 0617   MCHC 32.2 08/12/2019 0617   RDW 19.6 (H) 08/12/2019 0617   LYMPHSABS 0.7 08/12/2019 0617   MONOABS 0.9 08/12/2019 0617   EOSABS 0.1 08/12/2019 0617   BASOSABS 0.0 08/12/2019 0617    BMET    Component Value Date/Time   NA 132 (L) 08/12/2019 0617   K 2.8 (L) 08/12/2019 0617   CL 99 08/12/2019 0617   CO2 20 (L) 08/12/2019 0617   GLUCOSE 128 (H) 08/12/2019 0617   BUN 10 08/12/2019 0617   CREATININE 0.63 08/12/2019 0617   CREATININE 0.68 09/20/2017 1030   CALCIUM 8.1 (L) 08/12/2019 0617   GFRNONAA >60 08/12/2019 0617   GFRNONAA 96 09/20/2017 1030   GFRAA >60 08/12/2019 0617   GFRAA 111 09/20/2017 1030    INR    Component Value Date/Time   INR 1.3 (H) 08/12/2019 0617     Intake/Output Summary (Last 24 hours) at 08/12/2019 0741 Last data filed at 07/24/2019 2320 Gross per 24 hour  Intake 100 ml  Output --  Net 100 ml     Assessment/Plan:  64 y.o. female is s/p TEVAR 04/2019 with subsequent aortic arch replacement and extension of TEVAR 07/15/19; readmitted with concern for infected stent graft  No symptoms of GI bleed despite concern for connection between aneurysm and distal esophagus Back pain persistent despite scheduled fentanyl Extremities well perfused with  peripheral pulses intact Continue broad spectrum abx; blood cultures pending Multidisciplinary discussion this morning with treatment plan to follow   Emilie Rutter, PA-C [facility] [phone] 08/12/2019 7:41 AM  I have seen and evaluated the patient. I agree with the PA note as documented above.  64 year old female that has undergone multiple descending thoracic stent graft repairs as well as aortic arch repair with CT surgery.  I agree her CT findings are very concerning given abnormal appearance of the distal aorta beyond her stent graft and gas in the excluded aneurysm sac.  Continue broad spectrum IV antibiotics.  Would be aggressive in blood pressure heart rate control with systolics below 120 and heart rate below 80.  I have discussed the case with Dr. Renaldo Fiddler with CT surgery.  I do not feel any additional stent graft makes any sense at this point given likely mycotic process.  In discussing options with the patient she is hesitant to proceed with any further surgery at this time and would recommend hospice palliative care consult.  Cephus Shelling, MD [facility] [place] Office: [phone]

## 2019-08-12 NOTE — Progress Notes (Addendum)
K has returned high twice which mathematically makes no sense with timing and amount repleted.  They are drawing off right arm which is downstream from the K infusions earlier today.  I have ordered usual K correcting agents but want blood drawn from the left arm to confirm.  Also discussed surgery vs. Palliative with patient, she is still thinking about it.  Myrla Halsted MD PCCM

## 2019-08-13 DIAGNOSIS — I776 Arteritis, unspecified: Secondary | ICD-10-CM

## 2019-08-13 LAB — POTASSIUM
Potassium: 3.6 mmol/L (ref 3.5–5.1)
Potassium: 3.9 mmol/L (ref 3.5–5.1)

## 2019-08-13 LAB — CBC
HCT: 28.1 % — ABNORMAL LOW (ref 36.0–46.0)
Hemoglobin: 8.9 g/dL — ABNORMAL LOW (ref 12.0–15.0)
MCH: 25.8 pg — ABNORMAL LOW (ref 26.0–34.0)
MCHC: 31.7 g/dL (ref 30.0–36.0)
MCV: 81.4 fL (ref 80.0–100.0)
Platelets: 400 10*3/uL (ref 150–400)
RBC: 3.45 MIL/uL — ABNORMAL LOW (ref 3.87–5.11)
RDW: 20 % — ABNORMAL HIGH (ref 11.5–15.5)
WBC: 22.6 10*3/uL — ABNORMAL HIGH (ref 4.0–10.5)
nRBC: 0 % (ref 0.0–0.2)

## 2019-08-13 LAB — BASIC METABOLIC PANEL
Anion gap: 13 (ref 5–15)
BUN: 12 mg/dL (ref 8–23)
CO2: 23 mmol/L (ref 22–32)
Calcium: 9 mg/dL (ref 8.9–10.3)
Chloride: 99 mmol/L (ref 98–111)
Creatinine, Ser: 0.56 mg/dL (ref 0.44–1.00)
GFR calc Af Amer: >60 mL/min (ref >60)
GFR calc non Af Amer: >60 mL/min (ref >60)
Glucose, Bld: 46 mg/dL — ABNORMAL LOW (ref 70–99)
Potassium: 4.2 mmol/L (ref 3.5–5.1)
Sodium: 135 mmol/L (ref 135–145)

## 2019-08-13 MED ORDER — MAGNESIUM SULFATE 2 GM/50ML IV SOLN
2.0000 g | Freq: Once | INTRAVENOUS | Status: AC
  Administered 2019-08-13: 2 g via INTRAVENOUS
  Filled 2019-08-13: qty 50

## 2019-08-13 MED ORDER — FENTANYL 25 MCG/HR TD PT72
1.0000 | MEDICATED_PATCH | TRANSDERMAL | Status: DC
  Administered 2019-08-13: 1 via TRANSDERMAL
  Filled 2019-08-13: qty 1

## 2019-08-13 MED ORDER — POTASSIUM CHLORIDE CRYS ER 20 MEQ PO TBCR
20.0000 meq | EXTENDED_RELEASE_TABLET | Freq: Once | ORAL | Status: AC
  Administered 2019-08-13: 20 meq via ORAL
  Filled 2019-08-13: qty 1

## 2019-08-13 NOTE — Progress Notes (Signed)
Patient's bed was wet @0545  and patient complained of 9/10 back pain. While trying to roll patient in bed to provide clean sheets, she preferred to stand by the bed. Heart rate increased during standing and increased pain. Heart rate did not sustain in the 130's for long. Heart rate returned to low 100's once patient was back in bed.

## 2019-08-13 NOTE — Consult Note (Signed)
Consultation Note Date:  08/14/2019  Patient Name: Brittany [name]  DOB: 05-Jan-1956  MRN: [id number]  Age / Sex: 64 y.o., female  PCP: Ngetich, Nelda Bucks, NP Referring Physician: Candee Furbish, MD  Reason for Consultation: Establishing goals of care  HPI/Patient Profile: 64 y.o. 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 She has been started on broad spectrum antibiotics and does not believe she wants any additional surgery per notes. Palliative care requested to assist with goals of care conversation.   Clinical Assessment and Goals of Care: I have reviewed records and discussed with bedside RN. I have made attempts to set meeting with sister Brittany [name] per patient requests but I have not received any return calls to my messages.   I met today with Brittany [name]. I came this morning with hopes of catching her sister at bedside as well as Brittany [name] reports that she was supposed to come today but unsure when. Deyanna shares about not wanting surgery and asks about how long she has to live. She understands that she could only have a day to live or a little longer. She is scared and wants to be at home with her dog, Brittany [name]. Her roommate is trying to contact her daughter so she can explain to her the situation. She understands that she is a candidate for hospice and even thought she wishes to come home we could also discuss hospice facility given her ongoing complex pain and concern with symptom management.   We did  discuss code status as well and she became very emotional. At first she shares that she wants to be resuscitated. I further explained that if she does not desire surgery then resuscitation would be much worse and I worry this would only serve to cause her more suffering at the end of her life. She shares that the only reason she would desire resuscitation is because she is extremely fearful of feeling like she cannot breathe. I explained that we could be prepared and can manage with medication to ensure she is comfortable. She understands and would like this option.   She became very anxious and had a panic attack during this conversation so I ordered her Ativan 2 mg IV for relief. She calmed with Ativan and with discussion and distraction of conversation as I provided. I did not further discuss decisions or goals of care given her high anxiety. She enjoys talking about her dog and this seemed to calm her quickly and bring an immediate smile to her face. We did discuss that the hospital does allow pet visitation. She finds peace in knowing that her roommate will continue to care for her dog  after she dies and expresses that this brings her much Brittany [name] knowing Brittany [name] will be taken care of.   All questions/concerns addressed. Emotional support provided.   Primary Decision Maker PATIENT    SUMMARY OF RECOMMENDATIONS   - DNR - Liberalize medications to ensure comfort  Code Status/Advance Care Planning:  DNR   Symptom Management:   Pain: She seems to have high tolerance with poor relief of pain so far (noted previous + toxicology). OxyIR increased to 15 mg every 3 hours prn moderate pain. Dilaudid 2 mg IV every 2 hours prn severe pain/SOB. She already has fentanyl patch added yesterday at 25 mcg/hr. Gabapentin added per home dose. Robaxin prn muscle spasm.   Anxiety: Ativan 2 mg IV every 3 hours prn. Celexa 20 mg daily added per home dose.   Palliative Prophylaxis:   Frequent Pain Assessment and  Turn Reposition  Psycho-social/Spiritual:   Desire for further Chaplaincy support:yes  Additional Recommendations: Education on Hospice and Grief/Bereavement Support  Prognosis:   Overall prognosis extremely poor with no desire for surgery. Would be appropriate for hospice services at home or hospice facility.   Discharge Planning: To Be Determined      Primary Diagnoses: Present on Admission: . Aortic aneurysm ([place])   I have reviewed the medical record, interviewed the patient and family, and examined the patient. The following aspects are pertinent.  Past Medical History:  Diagnosis Date  . Alzheimer disease ([place])   . Anxiety   . Bilateral pneumonia 01/2017   per new patient packet   . COPD (chronic obstructive pulmonary disease) ([place]) 01/2017   per new patient packet   . Coronary artery disease   . Depression   . Fatty liver   . Gallbladder disease 2014   per new patient packet   . Hernia, abdominal   . Hypertension   . Liver damage 01/2017   per new patient packet, Dr.Kadakia  . Normal cardiac stress test 2007  . Ovarian cyst 1977   8 1/2 lb left ovarian cyst, Dr.Cox, per new patient packet   . Pneumonia   . Right ovarian cyst    1992, 1993, 1994, 1995, and 1996 Dr.Neil, per new patient packet   . Stroke ([place])    4 strokes.    Social History   Socioeconomic History  . Marital status: Divorced    Spouse name: Not on file  . Number of children: 1  . Years of education: one year of college  . Highest education level: Not on file  Occupational History  . Occupation: disabled  Tobacco Use  . Smoking status: Current Every Day Smoker    Packs/day: 0.75    Years: 46.00    Pack years: 34.50    Types: Cigarettes  . Smokeless tobacco: Never Used  Substance and Sexual Activity  . Alcohol use: Yes    Alcohol/week: 1.0 standard drinks    Types: 1 Cans of beer per week    Comment: once or twice a year.   . Drug use: Yes    Types: Cocaine    Comment: +  cocaine on admit  . Sexual activity: Never    Comment: last partner ex husbanc 8 years ago   Other Topics Concern  . 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      Lives at home with roommates.      Living Will: Yes   DNR: Yes   POA/HPOA: No      Functional Status:   Do you have difficulty bathing or dressing yourself? No   Do you have difficulty preparing food or eating? No   Do you have difficulty managing your medications? No   Do you have difficulty managing your finances? No   Do you have difficulty affording your medications? Yes Inhalers    Social Determinants of Health   Financial Resource Strain:   . Difficulty of Paying Living Expenses:   Food Insecurity:   . Worried About Charity fundraiser in the Last Year:   . Arboriculturist in the Last Year:   Transportation Needs:   . Film/video editor (Medical):   Marland Kitchen Lack of Transportation (Non-Medical):   Physical Activity:   . Days of Exercise per Week:   . Minutes of Exercise per Session:   Stress:   . Feeling of Stress :   Social Connections:   . Frequency of Communication with Friends and Family:   . Frequency of Social Gatherings with Friends and Family:   . Attends Religious Services:   . Active Member of Clubs or Organizations:   . Attends Archivist Meetings:   Marland Kitchen Marital Status:    Family History  Problem Relation Age of Onset  . Stroke Mother   . COPD Mother   . Dementia Mother   . Breast cancer Mother   . Diabetes Father   . Heart attack Father   . Diabetes Sister   . Stroke Sister        x2  . Hepatitis C Daughter    . Diabetes Paternal Grandmother    Scheduled Meds: . Chlorhexidine Gluconate Cloth  6 each Topical Daily  . fentaNYL  1 patch Transdermal Q72H  . pantoprazole (PROTONIX) IV  40 mg Intravenous Q24H  . sodium chloride flush  3 mL Intravenous Once  . sodium zirconium cyclosilicate  10 g Oral TID   Continuous Infusions: . ceFEPime (MAXIPIME) IV Stopped (08/12/19 2331)  . magnesium sulfate bolus IVPB    . metronidazole 500 mg (08/13/19 0921)  . vancomycin 500 mg (08/13/19 0608)   PRN Meds:.docusate sodium, fentaNYL (SUBLIMAZE) injection, oxyCODONE-acetaminophen, polyethylene glycol Allergies  Allergen Reactions  . Amoxicillin Hives and Rash     Has patient had a PCN reaction causing immediate rash, facial/tongue/throat swelling, SOB or lightheadedness with hypotension: Yes, hives Has patient had a PCN reaction causing severe rash involving mucus membranes or skin necrosis: No Has patient had a PCN reaction that required hospitalization No Has patient had a PCN reaction occurring within the last 10 years: Yes If all of the above answers are "NO", then may proceed with Cephalosporin use.   . Clindamycin/Lincomycin Diarrhea and Other (See Comments)    Patient states that clindamycin causes diarrhea   . Ropinirole Other (See Comments)    "Made me insane" (per the patient)  . Wellbutrin [Bupropion] Diarrhea and Other (See Comments)    Lightheadedness, also  . Sulfamethoxazole Nausea And Vomiting and Rash  .  Sulfonamide Derivatives Nausea And Vomiting and Rash   Review of Systems  Physical Exam  Vital Signs: BP (!) 148/77   Pulse 97   Temp 98.1 F (36.7 C) (Oral)   Resp (!) 24   Ht _0  (1.575 m)   Wt 44.1 kg   SpO2 98%   BMI 17.78 kg/m  Pain Scale: 0-10 POSS *See Group Information*: 2-Acceptable,Slightly drowsy, easily aroused Pain Score: 8    SpO2: SpO2: 98 % O2 Device:SpO2: 98 % O2 Flow Rate: .O2 Flow Rate (L/min): 2 L/min  IO: Intake/output summary:    Intake/Output Summary (Last 24 hours) at 08/13/2019 0957 Last data filed at 08/13/2019 0453 Gross per 24 hour  Intake 700 ml  Output 1400 ml  Net -700 ml    LBM: Last BM Date: 08/12/19 Baseline Weight: Weight: 41.7 kg Most recent weight: Weight: 44.1 kg     Palliative Assessment/Data:     Time In: 1500 Time Out: 1620 Time Total: 80 min Greater than 50%  of this time was spent counseling and coordinating care related to the above assessment and plan.  Signed by: Vinie Sill, NP Palliative Medicine Team Pager # [phone] (M-F 8a-5p) Team Phone # [phone] (Nights/Weekends)

## 2019-08-13 NOTE — Progress Notes (Addendum)
Progress Note    08/13/2019 7:14 AM * No surgery found *  Subjective:  Says she does not feel that great.  Says she's continuing to have back pain  Afebrile HR 90's-120's NSR/ST 161'W-960'A systolic 54%   Vitals:   09/81/19 0600 08/13/19 0622  BP: (!) 139/100   Pulse: 100 (!) 108  Resp: (!) 35 (!) 27  Temp:    SpO2: 97% 97%   Physical Exam: General:  Appears uncomfortable Neuro alert and oriented   CBC    Component Value Date/Time   WBC 22.6 (H) 08/13/2019 0212   RBC 3.45 (L) 08/13/2019 0212   HGB 8.9 (L) 08/13/2019 0212   HCT 28.1 (L) 08/13/2019 0212   PLT 400 08/13/2019 0212   MCV 81.4 08/13/2019 0212   MCH 25.8 (L) 08/13/2019 0212   MCHC 31.7 08/13/2019 0212   RDW 20.0 (H) 08/13/2019 0212   LYMPHSABS 0.7 08/12/2019 0617   MONOABS 0.9 08/12/2019 0617   EOSABS 0.1 08/12/2019 0617   BASOSABS 0.0 08/12/2019 0617    BMET    Component Value Date/Time   NA 135 08/13/2019 0212   K 3.6 08/13/2019 0627   CL 99 08/13/2019 0212   CO2 23 08/13/2019 0212   GLUCOSE 46 (L) 08/13/2019 0212   BUN 12 08/13/2019 0212   CREATININE 0.56 08/13/2019 0212   CREATININE 0.68 09/20/2017 1030   CALCIUM 9.0 08/13/2019 0212   GFRNONAA >60 08/13/2019 0212   GFRNONAA 96 09/20/2017 1030   GFRAA >60 08/13/2019 0212   GFRAA 111 09/20/2017 1030    INR    Component Value Date/Time   INR 1.3 (H) 08/12/2019 0617     Intake/Output Summary (Last 24 hours) at 08/13/2019 0714 Last data filed at 08/13/2019 0453 Gross per 24 hour  Intake 800.03 ml  Output 1400 ml  Net -599.97 ml     Assessment:  64 y.o. female is s/p:  TEVAR 04/2019 with subsequent aortic arch replacement and extension of TEVAR 07/15/19; readmitted with concern for infected stent graft    Plan: -pt continues to have back pain despite fentanyl/percocet. -Dr. Carlis Abbott saw pt yesterday and agrees that CT findings concerning given abnormal appearance of distal aorta beyond the stent graft and gas in the excluded  aneurysm sac.  He does not feels that additional stent graft is feasible at this point given mycotic process and pt does not want any more surgery at this point.  Palliative care consult ordered -continues to have elevated WBC--continue broad spectrum abx-blood cx no growth < 12 hrs x  2 -hgb only down slightly from 9.1 to 8.9 -hyperkalemia improved   Leontine Locket, PA-C [facility] [phone] 08/13/2019 7:14 AM  I have seen and evaluated the patient. I agree with the PA note as documented above.  Patient continues to have back pain overnight.  Descending thoracic aorta is degenerated distal to this previous stent in the thoracic aorta.  I do not think any other endovascular option with additional stent placement would be durable for her.  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  I discussed with Dr. Orvan Seen with CT surgery.  We will continue IV antibiotics.  Continue heart rate and blood pressure control.  Engage palliative care today.  During my in-depth discussion with her yesterday and again this morning sound like she does not want any more surgery and obviously should be facing very complex open operation.  Marty Heck, MD [facility] [place] Office: [phone]

## 2019-08-13 NOTE — Progress Notes (Signed)
   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  Patient states that she was in the office 5 days ago and was prescribed some pain medicines.  Patient continues to complain of abdominal pain and back pain with no improvement on pain medicine so she came in for further management to the emergency room.   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  Vascular surgery were called and are underway to evaluate the patient.  Patient denies any hematemesis or hemoptysis no GI bleeding.  She denies any fever chills or rigors.  Patient was started on antibiotics in the emergency room.    Objective   Blood pressure 138/90, pulse (!) 104, temperature 98.1 F (36.7 C), temperature source Oral, resp. rate (!) 22, height 5\' 2"  (1.575 m), weight 44.1 kg, SpO2 97 %.        Intake/Output Summary (Last 24 hours) at 08/13/2019 0852 Last data filed at 08/13/2019 0453 Gross per 24 hour  Intake 751.35 ml  Output 1400 ml  Net -648.65 ml   Filed Weights   08/13/2019 1823 08/12/19 0201 08/13/19 0500  Weight: 41.7 kg 43.7 kg 44.1 kg    Examination: GEN: thin frail woman in NAD HEENT: MMM, trachea midline CV: RRR, ext warm PULM: Clear, no wheezing GI: Soft, hypoactive BS EXT: no edema NEURO: moves all 4 ext to command PSYCH: RASS 0 SKIN: No rashes  Hypokalemia persists WBC up  Assessment & Plan:   Aortic graft infection - broad spectrum antibiotics - patient  does not think she wants surgery, wants to talk to palliative care, would probably not survive surgery even if pursued unfortunately  Sepsis - appears euvolemic, trend WBC/UOP  Hypokalemia- somehow overcorrected yesterday now back to being a bit low - replete  Best practice:  Diet: start heart healthy Pain/Anxiety/Delirium protocol (if indicated): start fentanyl patch, PRN percocet, fentanyl push for breakthrough VAP protocol (if indicated): n/a DVT prophylaxis: SCDs given concern for possible developing fistula GI prophylaxis: ppi Glucose control: ssi Mobility: BR Code Status: discussed with patient, full for now but she is thinking about it Family Communication: updated patient Disposition:  Okay for stepdown given that there are no immediate plans for surgery  08/15/19 MD PCCM

## 2019-08-13 NOTE — Progress Notes (Signed)
Palliative:  I visited briefly with Brittany [name] and no family at bedside. [name] understands that she has some difficult decisions ahead of her. She would like for her sister to be present for conversation and decision making. Contact information for sister Brittany [name] verified with Brittany [name]. I left voicemail at both numbers and also attempted to reach her again at both numbers in the afternoon with no success. I will follow up and try again in the morning. Brittany [name] does not believe that Brittany [name] is aware that she is hospitalized.   **Encourage all staff to continue trying to contact sister Brittany [name] to make her aware of hospitalization.**   No charge  Yong Channel, NP Palliative Medicine Team Pager [phone] (Please see [email or web address] for schedule) Team Phone [phone]

## 2019-08-14 ENCOUNTER — Inpatient Hospital Stay: Admission: RE | Admit: 2019-08-14 | Payer: Medicare HMO | Source: Ambulatory Visit

## 2019-08-14 ENCOUNTER — Other Ambulatory Visit: Payer: Medicare HMO

## 2019-08-14 DIAGNOSIS — Z952 Presence of prosthetic heart valve: Secondary | ICD-10-CM

## 2019-08-14 DIAGNOSIS — R52 Pain, unspecified: Secondary | ICD-10-CM

## 2019-08-14 DIAGNOSIS — I729 Aneurysm of unspecified site: Secondary | ICD-10-CM

## 2019-08-14 DIAGNOSIS — Z789 Other specified health status: Secondary | ICD-10-CM

## 2019-08-14 DIAGNOSIS — J449 Chronic obstructive pulmonary disease, unspecified: Secondary | ICD-10-CM

## 2019-08-14 DIAGNOSIS — Z66 Do not resuscitate: Secondary | ICD-10-CM

## 2019-08-14 DIAGNOSIS — R5381 Other malaise: Secondary | ICD-10-CM

## 2019-08-14 DIAGNOSIS — Z7189 Other specified counseling: Secondary | ICD-10-CM

## 2019-08-14 DIAGNOSIS — Z515 Encounter for palliative care: Secondary | ICD-10-CM

## 2019-08-14 DIAGNOSIS — Z8673 Personal history of transient ischemic attack (TIA), and cerebral infarction without residual deficits: Secondary | ICD-10-CM

## 2019-08-14 DIAGNOSIS — E46 Unspecified protein-calorie malnutrition: Secondary | ICD-10-CM

## 2019-08-14 LAB — BASIC METABOLIC PANEL
Anion gap: 11 (ref 5–15)
BUN: 16 mg/dL (ref 8–23)
CO2: 21 mmol/L — ABNORMAL LOW (ref 22–32)
Calcium: 8.4 mg/dL — ABNORMAL LOW (ref 8.9–10.3)
Chloride: 104 mmol/L (ref 98–111)
Creatinine, Ser: 0.55 mg/dL (ref 0.44–1.00)
GFR calc Af Amer: >60 mL/min (ref >60)
GFR calc non Af Amer: >60 mL/min (ref >60)
Glucose, Bld: 119 mg/dL — ABNORMAL HIGH (ref 70–99)
Potassium: 3.7 mmol/L (ref 3.5–5.1)
Sodium: 136 mmol/L (ref 135–145)

## 2019-08-14 LAB — MAGNESIUM: Magnesium: 1.8 mg/dL (ref 1.7–2.4)

## 2019-08-14 LAB — CBC
HCT: 31.3 % — ABNORMAL LOW (ref 36.0–46.0)
Hemoglobin: 9.7 g/dL — ABNORMAL LOW (ref 12.0–15.0)
MCH: 25.1 pg — ABNORMAL LOW (ref 26.0–34.0)
MCHC: 31 g/dL (ref 30.0–36.0)
MCV: 81.1 fL (ref 80.0–100.0)
Platelets: 457 10*3/uL — ABNORMAL HIGH (ref 150–400)
RBC: 3.86 MIL/uL — ABNORMAL LOW (ref 3.87–5.11)
RDW: 20.1 % — ABNORMAL HIGH (ref 11.5–15.5)
WBC: 16.7 10*3/uL — ABNORMAL HIGH (ref 4.0–10.5)
nRBC: 0 % (ref 0.0–0.2)

## 2019-08-14 MED ORDER — OXYCODONE HCL 5 MG PO TABS
10.0000 mg | ORAL_TABLET | ORAL | Status: DC | PRN
  Administered 2019-08-14: 10 mg via ORAL
  Filled 2019-08-14: qty 2

## 2019-08-14 MED ORDER — OXYCODONE HCL 5 MG PO TABS
15.0000 mg | ORAL_TABLET | ORAL | Status: DC | PRN
  Administered 2019-08-14 – 2019-08-15 (×5): 15 mg via ORAL
  Filled 2019-08-14 (×5): qty 3

## 2019-08-14 MED ORDER — CITALOPRAM HYDROBROMIDE 20 MG PO TABS
20.0000 mg | ORAL_TABLET | Freq: Every day | ORAL | Status: DC
  Administered 2019-08-14 – 2019-08-17 (×4): 20 mg via ORAL
  Filled 2019-08-14 (×4): qty 1

## 2019-08-14 MED ORDER — HYDROMORPHONE HCL 1 MG/ML IJ SOLN
2.0000 mg | INTRAMUSCULAR | Status: DC | PRN
  Administered 2019-08-15: 2 mg via INTRAVENOUS
  Filled 2019-08-14: qty 2

## 2019-08-14 MED ORDER — UMECLIDINIUM BROMIDE 62.5 MCG/INH IN AEPB
1.0000 | INHALATION_SPRAY | Freq: Every day | RESPIRATORY_TRACT | Status: DC
  Administered 2019-08-14 – 2019-08-16 (×3): 1 via RESPIRATORY_TRACT
  Filled 2019-08-14: qty 7

## 2019-08-14 MED ORDER — MOMETASONE FURO-FORMOTEROL FUM 200-5 MCG/ACT IN AERO
2.0000 | INHALATION_SPRAY | Freq: Two times a day (BID) | RESPIRATORY_TRACT | Status: DC
  Administered 2019-08-14 – 2019-08-16 (×5): 2 via RESPIRATORY_TRACT
  Filled 2019-08-14: qty 8.8

## 2019-08-14 MED ORDER — SODIUM CHLORIDE 0.9 % IV SOLN
2.0000 g | INTRAVENOUS | Status: DC
  Administered 2019-08-14 – 2019-08-16 (×3): 2 g via INTRAVENOUS
  Filled 2019-08-14: qty 20
  Filled 2019-08-14: qty 2
  Filled 2019-08-14: qty 20
  Filled 2019-08-14 (×2): qty 2

## 2019-08-14 MED ORDER — GABAPENTIN 600 MG PO TABS
300.0000 mg | ORAL_TABLET | Freq: Two times a day (BID) | ORAL | Status: DC
  Administered 2019-08-14 – 2019-08-15 (×2): 300 mg via ORAL
  Filled 2019-08-14 (×2): qty 1

## 2019-08-14 MED ORDER — CAMPHOR-MENTHOL 0.5-0.5 % EX LOTN
TOPICAL_LOTION | CUTANEOUS | Status: DC | PRN
  Filled 2019-08-14: qty 222

## 2019-08-14 MED ORDER — OXYCODONE-ACETAMINOPHEN 5-325 MG PO TABS: 1.0000 | ORAL_TABLET | ORAL | Status: DC | PRN

## 2019-08-14 MED ORDER — CARVEDILOL 3.125 MG PO TABS
3.1250 mg | ORAL_TABLET | Freq: Two times a day (BID) | ORAL | Status: DC
  Administered 2019-08-14 – 2019-08-15 (×4): 3.125 mg via ORAL
  Filled 2019-08-14 (×7): qty 1

## 2019-08-14 MED ORDER — METRONIDAZOLE IN NACL 5-0.79 MG/ML-% IV SOLN
500.0000 mg | Freq: Three times a day (TID) | INTRAVENOUS | Status: DC
  Administered 2019-08-14 – 2019-08-17 (×9): 500 mg via INTRAVENOUS
  Filled 2019-08-14 (×9): qty 100

## 2019-08-14 MED ORDER — LORAZEPAM 2 MG/ML IJ SOLN
2.0000 mg | INTRAMUSCULAR | Status: DC | PRN
  Administered 2019-08-15 – 2019-08-17 (×3): 2 mg via INTRAVENOUS
  Filled 2019-08-14 (×3): qty 1

## 2019-08-14 MED ORDER — LORAZEPAM 2 MG/ML IJ SOLN
2.0000 mg | INTRAMUSCULAR | Status: AC
  Administered 2019-08-14: 2 mg via INTRAVENOUS
  Filled 2019-08-14: qty 1

## 2019-08-14 MED ORDER — LEVALBUTEROL HCL 0.63 MG/3ML IN NEBU: 0.6300 mg | INHALATION_SOLUTION | Freq: Four times a day (QID) | RESPIRATORY_TRACT | Status: DC | PRN

## 2019-08-14 MED ORDER — IPRATROPIUM BROMIDE 0.02 % IN SOLN: 0.5000 mg | Freq: Four times a day (QID) | RESPIRATORY_TRACT | Status: DC | PRN

## 2019-08-14 MED ORDER — FENTANYL CITRATE (PF) 100 MCG/2ML IJ SOLN: 100.0000 ug | INTRAMUSCULAR | Status: DC | PRN

## 2019-08-14 MED ORDER — METHOCARBAMOL 500 MG PO TABS
500.0000 mg | ORAL_TABLET | Freq: Four times a day (QID) | ORAL | Status: DC | PRN
  Administered 2019-08-15: 500 mg via ORAL
  Filled 2019-08-14: qty 1

## 2019-08-14 MED ORDER — LABETALOL HCL 5 MG/ML IV SOLN
5.0000 mg | INTRAVENOUS | Status: DC | PRN
  Administered 2019-08-14: 5 mg via INTRAVENOUS
  Filled 2019-08-14: qty 4

## 2019-08-14 NOTE — Consult Note (Signed)
[facility]    Date of Admission:  07/21/2019      Total days of antibiotics 3   Vancomycin Cefepine and Metronidazole         Reason for Consult: Mycotic Aneurysm with graft     Referring Provider: Alanda Slim  Primary Care Provider: Caesar Bookman, NP   Assessment: Brittany [name] is a 64 y.o. 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 She has had new onset right scapular severe posterior chest pain for the last week with a CT scan now confirming concern over mycotic process involving her graft < 44m after surgery. CT surgery and Vascular surgery agree that the operative risk is extremely high surviving any further surgical intervention.   She is clearly facing a difficult time and is appropriately grieving - offered emotional support during visit. Would really like to see her dog if possible to arrange.   Blood cultures are negative and she has had no fevers/chills at home. Will keep on Vancomycin to cover typical pathogens I would expect here. Keep flagyl with concern over communication with stomach. Will narrow gram negative coverage to Ceftriaxone given less concern over pseudomonas here. Typically would add rifampin however unclear the benefit outweighs the risk of GI side effects/discomfort and impact on pain medication metabolism.  Will be difficult to suppress effectively without pathogen. Would avoid quinolone use given aneurysm.    Plan: 1. Continue vancomycin and IV metronidazole (prefer to keep IV to minimize possible GI upset that would prompt vomiting)  2. Change cefepime to ceftriaxone  3. Follow micro cultures     Active Problems:   Aortic aneurysm (HCC)   . carvedilol  3.125 mg Oral BID WC  . Chlorhexidine Gluconate Cloth  6 each Topical Daily  . fentaNYL  1 patch Transdermal Q72H  . pantoprazole (PROTONIX) IV  40 mg Intravenous Q24H  . sodium chloride  flush  3 mL Intravenous Once    HPI: Brittany [name] is a 64 y.o. female admitted from home to evaluate severe and increasing posterior chest/back pain.   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 During this surgery she recovered well aside from vocal cord dysfunction and a fall at home resulting in broken ribs. She has been back to the ER a few times since surgery --> March 2021 she had precordial chest pain; during this admission she had CT that was negative for dissection or dislodgement of stent, did mention she had an infiltrated IV with mild cellulitis that was treated with antibiotics and discharged home on Doxycycline 100 mg BID x 10 days.  Readmitted again April 22nd with worsening chest pain --> she had a mild leukocytosis at this time; CT scan revealed concern for contained rupture/pseudoaneurysm. Blood cultures at this time were negative as well as initial intraoperative gram stain on pleural fluid.   CT scan during this admission for severe scapular chest pain revealing concern over mycotic process overlying graft with notable gas surrounding the tissues.   Her incisions have all been healing well. Dr. Darcella Cheshire note indicates that there was initial concern for mycotic process but the blood cultures prior to operation were negative as were intraoperative cultures. She has had no fevers or chills. No night sweats. She has had some weight loss due to difficulty swallowing  since surgery.   Discussions ongoing about goals of care for her noted - very high risk of not surviving any further surgical intervention; she states she has a good understanding of the risk of death with or without this procedure.   She is very tearful during the visit. Worried about her dog, Marcille Blanco. She has a roommate who is going to help care for her. Sister and daughter are planning to visit with  Coralyn Mark today.  History at the time of my visit is limited due to severe intermittent pain 10/10.    Review of Systems: Review of Systems  Constitutional: Negative for chills, fever, malaise/fatigue and weight loss.  HENT: Negative for sore throat.        No dental problems  Respiratory: Negative for cough and sputum production.   Cardiovascular: Negative for chest pain and leg swelling.  Gastrointestinal: Negative for abdominal pain, diarrhea and vomiting.  Genitourinary: Negative for dysuria and flank pain.  Musculoskeletal: Positive for back pain. Negative for joint pain, myalgias and neck pain.  Skin: Negative for rash.  Neurological: Negative for dizziness, tingling and headaches.    Past Medical History:  Diagnosis Date  . Alzheimer disease ([place])   . Anxiety   . Bilateral pneumonia 01/2017   per new patient packet   . COPD (chronic obstructive pulmonary disease) ([place]) 01/2017   per new patient packet   . Coronary artery disease   . Depression   . Fatty liver   . Gallbladder disease 2014   per new patient packet   . Hernia, abdominal   . Hypertension   . Liver damage 01/2017   per new patient packet, Dr.Kadakia  . Normal cardiac stress test 2007  . Ovarian cyst 1977   8 1/2 lb left ovarian cyst, Dr.Cox, per new patient packet   . Pneumonia   . Right ovarian cyst    1992, 1993, 1994, 1995, and 1996 Dr.Neil, per new patient packet   . Stroke ([place])    4 strokes.     Social History   Tobacco Use  . Smoking status: Current Every Day Smoker    Packs/day: 0.75    Years: 46.00    Pack years: 34.50    Types: Cigarettes  . Smokeless tobacco: Never Used  Substance Use Topics  . Alcohol use: Yes    Alcohol/week: 1.0 standard drinks    Types: 1 Cans of beer per week    Comment: once or twice a year.   . Drug use: Yes    Types: Cocaine    Comment: + cocaine on admit    Family History  Problem Relation Age of Onset  . Stroke Mother   . COPD Mother   . Dementia  Mother   . Breast cancer Mother   . Diabetes Father   . Heart attack Father   . Diabetes Sister   . Stroke Sister        x2  . Hepatitis C Daughter   . Diabetes Paternal Grandmother    Allergies  Allergen Reactions  . Amoxicillin Hives and Rash     Has patient had a PCN reaction causing immediate rash, facial/tongue/throat swelling, SOB or lightheadedness with hypotension: Yes, hives Has patient had a PCN reaction causing severe rash involving mucus membranes or skin necrosis: No Has patient had a PCN reaction that required hospitalization No Has patient had a PCN reaction occurring within the last 10 years: Yes If all of the above answers are "NO", then may  proceed with Cephalosporin use.   . Clindamycin/Lincomycin Diarrhea and Other (See Comments)    Patient states that clindamycin causes diarrhea   . Ropinirole Other (See Comments)    "Made me insane" (per the patient)  . Wellbutrin [Bupropion] Diarrhea and Other (See Comments)    Lightheadedness, also  . Sulfamethoxazole Nausea And Vomiting and Rash  . Sulfonamide Derivatives Nausea And Vomiting and Rash    OBJECTIVE: Blood pressure 118/65, pulse 91, temperature 98.1 F (36.7 C), temperature source Oral, resp. rate 20, height 5\' 2"  (1.575 m), weight 46.2 kg, SpO2 97 %.  Physical Exam Constitutional:      Appearance: She is well-developed.     Comments: Malnourished, grimacing in pain that is colicky while in bed. Crying  HENT:     Mouth/Throat:     Mouth: No oral lesions.     Dentition: Normal dentition. No dental abscesses.     Pharynx: No oropharyngeal exudate.  Cardiovascular:     Rate and Rhythm: Normal rate and regular rhythm.     Heart sounds: Normal heart sounds.  Pulmonary:     Effort: Pulmonary effort is normal.     Breath sounds: Normal breath sounds.  Chest:       Comments: Incisions well healed and without signs of erythema.  Abdominal:     General: There is no distension.     Palpations: Abdomen  is soft.     Tenderness: There is no abdominal tenderness.  Lymphadenopathy:     Cervical: No cervical adenopathy.  Skin:    General: Skin is warm and dry.     Findings: No rash.  Neurological:     Mental Status: She is alert and oriented to person, place, and time.  Psychiatric:        Judgment: Judgment normal.     Comments: In good spirits today and engaged in care discussion     Lab Results Lab Results  Component Value Date   WBC 16.7 (H) 08/14/2019   HGB 9.7 (L) 08/14/2019   HCT 31.3 (L) 08/14/2019   MCV 81.1 08/14/2019   PLT 457 (H) 08/14/2019    Lab Results  Component Value Date   CREATININE 0.55 08/14/2019   BUN 16 08/14/2019   NA 136 08/14/2019   K 3.7 08/14/2019   CL 104 08/14/2019   CO2 21 (L) 08/14/2019    Lab Results  Component Value Date   ALT 13 2019/08/26   AST 14 (L) 08/26/2019   ALKPHOS 59 08-26-2019   BILITOT 1.0 2019-08-26     Microbiology: Recent Results (from the past 240 hour(s))  SARS Coronavirus 2 by RT PCR (hospital order, performed in [facility] hospital lab) Nasopharyngeal Nasopharyngeal Swab     Status: None   Collection Time: Aug 26, 2019 10:22 PM   Specimen: Nasopharyngeal Swab  Result Value Ref Range Status   SARS Coronavirus 2 NEGATIVE NEGATIVE Final    Comment: (NOTE) SARS-CoV-2 target nucleic acids are NOT DETECTED. The SARS-CoV-2 RNA is generally detectable in upper and lower respiratory specimens during the acute phase of infection. The lowest concentration of SARS-CoV-2 viral copies this assay can detect is 250 copies / mL. A negative result does not preclude SARS-CoV-2 infection and should not be used as the sole basis for treatment or other patient management decisions.  A negative result may occur with improper specimen collection / handling, submission of specimen other than nasopharyngeal swab, presence of viral mutation(s) within the areas targeted by this assay, and inadequate number  of viral copies (<250 copies /  mL). A negative result must be combined with clinical observations, patient history, and epidemiological information. Fact Sheet for Patients:   BoilerBrush.com.cy Fact Sheet for Healthcare Providers: https://pope.com/ This test is not yet approved or cleared  by the [place] FDA and has been authorized for detection and/or diagnosis of SARS-CoV-2 by FDA under an Emergency Use Authorization (EUA).  This EUA will remain in effect (meaning this test can be used) for the duration of the COVID-19 declaration under Section 564(b)(1) of the Act, 21 U.S.C. section 360bbb-3(b)(1), unless the authorization is terminated or revoked sooner. Performed at [facility], [address], [place], [place] [address]   Blood culture (routine x 2)     Status: None (Preliminary result)   Collection Time: 07/28/2019 11:03 PM   Specimen: BLOOD RIGHT ARM  Result Value Ref Range Status   Specimen Description BLOOD RIGHT ARM  Final   Special Requests   Final    BOTTLES DRAWN AEROBIC AND ANAEROBIC Blood Culture adequate volume   Culture   Final    NO GROWTH 3 DAYS Performed at [facility], [address], [place], [place] [address]    Report Status PENDING  Incomplete  MRSA PCR Screening     Status: None   Collection Time: 08/12/19  1:59 AM   Specimen: Nasopharyngeal  Result Value Ref Range Status   MRSA by PCR NEGATIVE NEGATIVE Final    Comment:        The GeneXpert MRSA Assay (FDA approved for NASAL specimens only), is one component of a comprehensive MRSA colonization surveillance program. It is not intended to diagnose MRSA infection nor to guide or monitor treatment for MRSA infections. Performed at [facility], [address] [place], [place] [address]   Blood culture (routine x 2)     Status: None (Preliminary result)   Collection Time: 08/12/19  6:17 AM   Specimen: BLOOD RIGHT ARM  Result Value Ref Range Status    Specimen Description BLOOD RIGHT ARM  Final   Special Requests AEROBIC BOTTLE ONLY Blood Culture adequate volume  Final   Culture   Final    NO GROWTH 2 DAYS Performed at [facility], [address], [place], [place] [address]    Report Status PENDING  Incomplete    Rexene Alberts, MSN, NP-C [facility]  [email or web address] Pager: [phone] Office: [phone] [facility] Main Line: [phone]   08/14/2019 2:53 PM

## 2019-08-14 NOTE — Progress Notes (Signed)
[facility] [place]  Subjective  -continues to have back pain   Objective (!) 155/87 100 98.2 F (36.8 C) (Oral) (!) 22 100%  Intake/Output Summary (Last 24 hours) at 08/14/2019 0814 Last data filed at 08/13/2019 2235 Gross per 24 hour  Intake 240 ml  Output --  Net 240 ml      Laboratory Lab Results: Recent Labs    08/13/19 0212 08/14/19 0526  WBC 22.6* 16.7*  HGB 8.9* 9.7*  HCT 28.1* 31.3*  PLT 400 457*   BMET Recent Labs    08/13/19 0212 08/13/19 0627 08/13/19 1310 08/14/19 0526  NA 135  --   --  136  K 4.2   < > 3.9 3.7  CL 99  --   --  104  CO2 23  --   --  21*  GLUCOSE 46*  --   --  119*  BUN 12  --   --  16  CREATININE 0.56  --   --  0.55  CALCIUM 9.0  --   --  8.4*   < > = values in this interval not displayed.    [facility]AG Lab Results  Component Value Date   INR 1.3 (H) 08/12/2019   INR 1.5 (H) 07/15/2019   INR 1.2 07/15/2019   No results found for: PTT  Assessment/Planning:  Continues to be symptomatic with back pain overnight.  Appreciate palliative care seeing her yesterday and I still think she is leaning away from any additional surgery.  She wants to talk to her sister today who plans to come to the hospital.  I think her main goal is to go home and see her dog after further discussion this morning.  Continue IV antibiotics and blood pressure control until her goals of care officially established.  Cephus Shelling 08/14/2019 8:14 AM --

## 2019-08-14 NOTE — Progress Notes (Signed)
   08/14/19 1000  Clinical Encounter Type  Visited With Patient  Visit Type Initial  Referral From Palliative care team  Consult/Referral To Chaplain  This chaplain responded to PMT referral for spiritual care.  The Pt. declined a visit from the chaplain and invited the chaplain to return later in the day. The chaplain is noting the Pt. asked the chaplain about how to contact the RN for pain medication.

## 2019-08-14 NOTE — Progress Notes (Signed)
Chaplain was present for F/U spiritual care visit.  PMT-AP is bedside with Pt.  The chaplain will F/U Thursday morning.

## 2019-08-14 NOTE — Progress Notes (Signed)
PROGRESS NOTE  Brittany [name] WUJ:[id number] DOB: 02-01-1956   PCP: Sandrea Hughs, NP  Patient is from: Home.  DOA: 07/23/2019 LOS: 3  Brief Narrative / Interim history: 64 year old female with history of thoracic AA s/p endovascular graft on 05/11/2019 and 07/15/2019, aortic arch repair, CAD, CVA, HTN, malnutrition, COPD, GERD, chronic back pain,  chronic hep C, Alzheimer's, anxiety and depression who presented with back pain and abdominal pain.  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  Vascular surgery consulted.  Patient was admitted to ICU and managed medically with broad-spectrum antibiotics.  Vascular surgery recommended palliative care as she is not interested in additional surgery.   TRH assumed care on 5/26.  Infectious disease consulted.   Subjective: Seen and examined earlier this morning.  No major events overnight of this morning.  Complains severe back pain that she rates 10/10.  Pain is basically unchanged.  She says she is waiting on her pain medication.  She denies chest pain, dyspnea or abdominal pain.  Denies GI or UTI symptoms.  Objective: Vitals:   08/14/19 0845 08/14/19 1045 08/14/19 1200 08/14/19 1345  BP: 128/76 121/72 105/64 118/65  Pulse: (!) 101 91 90 91  Resp: (!) 21 17 20 20   Temp:  98.2 F (36.8 C) 98 F (36.7 C) 98.1 F (36.7 C)  TempSrc:  Oral Oral Oral  SpO2: 98% 98% 97% 97%  Weight:      Height:        Intake/Output Summary (Last 24 hours) at 08/14/2019 1601 Last data filed at 08/13/2019 2235 Gross per 24 hour  Intake 240 ml  Output --  Net 240 ml   Filed Weights   08/12/19 0201 08/13/19 0500 08/14/19 0455  Weight: 43.7 kg 44.1 kg 46.2 kg    Examination:  GENERAL: Seems to be  uncomfortable due to back pain HEENT: MMM.  Vision and hearing grossly intact.  NECK: Supple.  No apparent JVD.  RESP:  No IWOB.  Fair aeration bilaterally. CVS:  RRR. Heart sounds normal.  ABD/GI/GU: BS+. Abd soft, NTND.  MSK/EXT:  Moves extremities. No apparent deformity. No edema.  2+ DP pulses bilaterally SKIN: no apparent skin lesion or wound NEURO: Awake, alert and oriented fairly.  No apparent focal neuro deficit. PSYCH: Seems to be uncomfortable due to back pain  Procedures:  None  Microbiology summarized: 5/23-COVID-19 PCR negative. 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 -On broad-spectrum antibiotics -Infectious disease consulted for guidance -Palliative care following-now DNR/DNI. -Optimal BP and HR control-started Coreg with as needed labetalol  History of CAD/CVA-no obvious focal deficit -Continue home medications  Chronic COPD: Stable.  No PFT in the chart.  -Dulera instead of home Symbicort.  Add Incruse Ellipta. -As needed Xopenex and Atrovent  Essential hypertension: On metoprolol and Lasix at home. -Coreg and as needed labetalol as above  Anxiety/depression/Alzheimer's dementia? -Continue Celexa -As needed Ativan added by palliative care.  Also on gabapentin.  Chronic back pain: On oxycodone and gabapentin at home. -Now on oxycodone, Robaxin and Dilaudid  Chronic hep C: Treated? -Outpatient follow-up with ID  Debility -PT/OT when stable  from vascular standpoint  Goal of care discussion -Now DNR/DNI.  Appreciate help by PMT  Severe malnutrition: Body mass index is 18.63 kg/m. -Consult dietitian            DVT prophylaxis: SCD Code Status: DNR/DNI Family Communication: Patient and/or RN. Available if any question.  Status is:  Inpatient  Remains inpatient appropriate because:Hemodynamically unstable, IV treatments appropriate due to intensity of illness or inability to take PO and Inpatient level of care appropriate due to severity of illness   Dispo: The patient is from: Home              Anticipated d/c is to: Home              Anticipated d/c date is: 2 days              Patient currently is not medically stable to d/c.        Consultants:  Vascular surgery Infectious disease Palliative medicine   Sch Meds:  Scheduled Meds: . carvedilol  3.125 mg Oral BID WC  . Chlorhexidine Gluconate Cloth  6 each Topical Daily  . citalopram  20 mg Oral Daily  . fentaNYL  1 patch Transdermal Q72H  . gabapentin  300 mg Oral BID  . LORazepam  2 mg Intravenous STAT  . pantoprazole (PROTONIX) IV  40 mg Intravenous Q24H  . sodium chloride flush  3 mL Intravenous Once   Continuous Infusions: . cefTRIAXone (ROCEPHIN)  IV    . metronidazole    . vancomycin 500 mg (08/14/19 0641)   PRN Meds:.camphor-menthol, docusate sodium, fentaNYL (SUBLIMAZE) injection, labetalol, LORazepam, methocarbamol, oxyCODONE, polyethylene glycol  Antimicrobials: Anti-infectives (From admission, onward)   Start     Dose/Rate Route Frequency Ordered Stop   08/14/19 1800  cefTRIAXone (ROCEPHIN) 2 g in sodium chloride 0.9 % 100 mL IVPB     2 g 200 mL/hr over 30 Minutes Intravenous Every 24 hours 08/14/19 1519     08/14/19 1630  metroNIDAZOLE (FLAGYL) IVPB 500 mg     500 mg 100 mL/hr over 60 Minutes Intravenous Every 8 hours 08/14/19 1519     08/12/19 1800  vancomycin (VANCOREADY) IVPB 500 mg/100 mL     500 mg 100 mL/hr over 60 Minutes Intravenous Every 12 hours 08/12/19 0209     08/12/19 1000  ceFEPIme (MAXIPIME) 2 g in sodium chloride 0.9 % 100 mL IVPB  Status:  Discontinued     2 g 200 mL/hr over 30 Minutes Intravenous Every 12 hours 08/12/19 0209 08/14/19 1519   08/12/19 0800  metroNIDAZOLE (FLAGYL) IVPB 500 mg  Status:   Discontinued     500 mg 100 mL/hr over 60 Minutes Intravenous Every 8 hours 08/13/19 2354 08/14/19 1420   Aug 13, 2019 2245  ceFEPIme (MAXIPIME) 2 g in sodium chloride 0.9 % 100 mL IVPB     2 g 200 mL/hr over 30 Minutes Intravenous  Once 08-13-19 2243 08/12/19 0050   08-13-2019 2245  metroNIDAZOLE (FLAGYL) IVPB 500 mg     500 mg 100 mL/hr over 60 Minutes Intravenous  Once 08-13-2019 2243 08/12/19 0159   08-13-19 2245  vancomycin (VANCOCIN) IVPB 1000 mg/200 mL premix     1,000 mg 200 mL/hr over 60 Minutes Intravenous  Once 13-Aug-2019 2243 08/12/19 0309       I have personally reviewed the following labs and images: CBC: Recent Labs  Lab 13-Aug-2019 2053 08/12/19 0617 08/13/19 0212 08/14/19 0526  WBC 22.5* 24.6* 22.6* 16.7*  NEUTROABS  --  22.7*  --   --   HGB 8.6* 9.1* 8.9* 9.7*  HCT 27.3* 28.3* 28.1* 31.3*  MCV 80.1 79.7* 81.4 81.1  PLT 399 414* 400 457*   BMP &GFR Recent Labs  Lab 08/25/19 2053 2019/08/25 2053 08/12/19 0617 08/12/19 1642 08/12/19 2010 08/12/19 2010 08/12/19 2239 08/13/19 0212 08/13/19 0627 08/13/19 1310 08/14/19 0526  NA 134*  --  132*  --  136  --   --  135  --   --  136  K 2.5*   < > 2.8*   < > 5.3*   < > 4.1 4.2 3.6 3.9 3.7  CL 96*  --  99  --  102  --   --  99  --   --  104  CO2 25  --  20*  --  24  --   --  23  --   --  21*  GLUCOSE 95  --  128*  --  84  --   --  46*  --   --  119*  BUN 14  --  10  --  13  --   --  12  --   --  16  CREATININE 0.60  --  0.63  --  0.60  --   --  0.56  --   --  0.55  CALCIUM 8.1*  --  8.1*  --  8.9  --   --  9.0  --   --  8.4*  MG  --   --   --   --   --   --   --   --   --   --  1.8   < > = values in this interval not displayed.   Estimated Creatinine Clearance: 53.9 mL/min (by C-G formula based on SCr of 0.55 mg/dL). Liver & Pancreas: Recent Labs  Lab 08-25-2019 2053  AST 14*  ALT 13  ALKPHOS 59  BILITOT 1.0  PROT 5.8*  ALBUMIN 2.2*   Recent Labs  Lab 25-Aug-2019 2053  LIPASE 13   No results for input(s):  AMMONIA in the last 168 hours. Diabetic: No results for input(s): HGBA1C in the last 72 hours. No results for input(s): GLUCAP in the last 168 hours. Cardiac Enzymes: No results for input(s): CKTOTAL, CKMB, CKMBINDEX, TROPONINI in the last 168 hours. No results for input(s): PROBNP in the last 8760 hours. Coagulation Profile: Recent Labs  Lab 08/12/19 0617  INR 1.3*   Thyroid Function Tests: No results for input(s): TSH, T4TOTAL, FREET4, T3FREE, THYROIDAB in the last 72 hours. Lipid Profile: No results for input(s): CHOL, HDL, LDLCALC, TRIG, CHOLHDL, LDLDIRECT in the last 72 hours. Anemia Panel: No results for input(s): VITAMINB12, FOLATE, FERRITIN, TIBC, IRON, RETICCTPCT in the last 72 hours. Urine analysis:    Component Value Date/Time   COLORURINE YELLOW 08/12/2019 0559   APPEARANCEUR CLEAR 08/12/2019 0559   LABSPEC >1.046 (H) 08/12/2019 0559   PHURINE 6.0 08/12/2019 0559   GLUCOSEU NEGATIVE 08/12/2019 0559   HGBUR NEGATIVE 08/12/2019 0559   BILIRUBINUR NEGATIVE 08/12/2019 0559   KETONESUR 5 (A) 08/12/2019 0559   PROTEINUR 30 (A) 08/12/2019 0559   UROBILINOGEN 0.2 07/11/2013 1933   NITRITE NEGATIVE 08/12/2019 0559   LEUKOCYTESUR NEGATIVE 08/12/2019 0559   Sepsis Labs: Invalid input(s): PROCALCITONIN, LACTICIDVEN  Microbiology: Recent Results (from the past 240 hour(s))  SARS Coronavirus 2 by RT PCR (hospital order, performed in [facility] hospital lab) Nasopharyngeal Nasopharyngeal Swab  Status: None   Collection Time: August 24, 2019 10:22 PM   Specimen: Nasopharyngeal Swab  Result Value Ref Range Status   SARS Coronavirus 2 NEGATIVE NEGATIVE Final    Comment: (NOTE) SARS-CoV-2 target nucleic acids are NOT DETECTED. The SARS-CoV-2 RNA is generally detectable in upper and lower respiratory specimens during the acute phase of infection. The lowest concentration of SARS-CoV-2 viral copies this assay can detect is 250 copies / mL. A negative result does not preclude  SARS-CoV-2 infection and should not be used as the sole basis for treatment or other patient management decisions.  A negative result may occur with improper specimen collection / handling, submission of specimen other than nasopharyngeal swab, presence of viral mutation(s) within the areas targeted by this assay, and inadequate number of viral copies (<250 copies / mL). A negative result must be combined with clinical observations, patient history, and epidemiological information. Fact Sheet for Patients:   BoilerBrush.com.cy Fact Sheet for Healthcare Providers: https://pope.com/ This test is not yet approved or cleared  by the [place] FDA and has been authorized for detection and/or diagnosis of SARS-CoV-2 by FDA under an Emergency Use Authorization (EUA).  This EUA will remain in effect (meaning this test can be used) for the duration of the COVID-19 declaration under Section 564(b)(1) of the Act, 21 U.S.C. section 360bbb-3(b)(1), unless the authorization is terminated or revoked sooner. Performed at [facility], [address], [place], [place] [address]   Blood culture (routine x 2)     Status: None (Preliminary result)   Collection Time: 2019-08-24 11:03 PM   Specimen: BLOOD RIGHT ARM  Result Value Ref Range Status   Specimen Description BLOOD RIGHT ARM  Final   Special Requests   Final    BOTTLES DRAWN AEROBIC AND ANAEROBIC Blood Culture adequate volume   Culture   Final    NO GROWTH 3 DAYS Performed at [facility], [address], [place], [place] [address]    Report Status PENDING  Incomplete  MRSA PCR Screening     Status: None   Collection Time: 08/12/19  1:59 AM   Specimen: Nasopharyngeal  Result Value Ref Range Status   MRSA by PCR NEGATIVE NEGATIVE Final    Comment:        The GeneXpert MRSA Assay (FDA approved for NASAL specimens only), is one component of a comprehensive MRSA  colonization surveillance program. It is not intended to diagnose MRSA infection nor to guide or monitor treatment for MRSA infections. Performed at [facility], [address] [place], [place] [address]   Blood culture (routine x 2)     Status: None (Preliminary result)   Collection Time: 08/12/19  6:17 AM   Specimen: BLOOD RIGHT ARM  Result Value Ref Range Status   Specimen Description BLOOD RIGHT ARM  Final   Special Requests AEROBIC BOTTLE ONLY Blood Culture adequate volume  Final   Culture   Final    NO GROWTH 2 DAYS Performed at [facility], [address], [place], [place] [address]    Report Status PENDING  Incomplete    Radiology Studies: No results found.  45 minutes with more than 50% spent in reviewing records, counseling patient/family and coordinating care.   Meerab Maselli T. Kama Cammarano [facility]  If 7PM-7AM, please contact night-coverage [email or web address] Password [facility] 08/14/2019, 4:01 PM

## 2019-08-15 DIAGNOSIS — E43 Unspecified severe protein-calorie malnutrition: Secondary | ICD-10-CM | POA: Diagnosis present

## 2019-08-15 LAB — BLOOD CULTURE ID PANEL (REFLEXED)

## 2019-08-15 LAB — BASIC METABOLIC PANEL
Anion gap: 10 (ref 5–15)
BUN: 12 mg/dL (ref 8–23)
CO2: 21 mmol/L — ABNORMAL LOW (ref 22–32)
Calcium: 8.1 mg/dL — ABNORMAL LOW (ref 8.9–10.3)
Chloride: 105 mmol/L (ref 98–111)
Creatinine, Ser: 0.56 mg/dL (ref 0.44–1.00)
GFR calc Af Amer: >60 mL/min (ref >60)
GFR calc non Af Amer: >60 mL/min (ref >60)
Glucose, Bld: 125 mg/dL — ABNORMAL HIGH (ref 70–99)
Potassium: 3.4 mmol/L — ABNORMAL LOW (ref 3.5–5.1)
Sodium: 136 mmol/L (ref 135–145)

## 2019-08-15 LAB — CBC
HCT: 28.5 % — ABNORMAL LOW (ref 36.0–46.0)
Hemoglobin: 8.7 g/dL — ABNORMAL LOW (ref 12.0–15.0)
MCH: 25.1 pg — ABNORMAL LOW (ref 26.0–34.0)
MCHC: 30.5 g/dL (ref 30.0–36.0)
MCV: 82.1 fL (ref 80.0–100.0)
Platelets: 452 10*3/uL — ABNORMAL HIGH (ref 150–400)
RBC: 3.47 MIL/uL — ABNORMAL LOW (ref 3.87–5.11)
RDW: 20.2 % — ABNORMAL HIGH (ref 11.5–15.5)
WBC: 15.9 10*3/uL — ABNORMAL HIGH (ref 4.0–10.5)
nRBC: 0 % (ref 0.0–0.2)

## 2019-08-15 LAB — MAGNESIUM: Magnesium: 1.5 mg/dL — ABNORMAL LOW (ref 1.7–2.4)

## 2019-08-15 MED ORDER — ADULT MULTIVITAMIN W/MINERALS CH
1.0000 | ORAL_TABLET | Freq: Every day | ORAL | Status: DC
  Administered 2019-08-16 – 2019-08-17 (×2): 1 via ORAL
  Filled 2019-08-15 (×2): qty 1

## 2019-08-15 MED ORDER — ENSURE ENLIVE PO LIQD
237.0000 mL | Freq: Three times a day (TID) | ORAL | Status: DC
  Administered 2019-08-15 – 2019-08-16 (×3): 237 mL via ORAL

## 2019-08-15 MED ORDER — GABAPENTIN 600 MG PO TABS
300.0000 mg | ORAL_TABLET | Freq: Three times a day (TID) | ORAL | Status: DC
  Administered 2019-08-15 – 2019-08-17 (×6): 300 mg via ORAL
  Filled 2019-08-15 (×7): qty 1

## 2019-08-15 MED ORDER — POTASSIUM CHLORIDE CRYS ER 20 MEQ PO TBCR
40.0000 meq | EXTENDED_RELEASE_TABLET | Freq: Once | ORAL | Status: AC
  Administered 2019-08-15: 40 meq via ORAL
  Filled 2019-08-15: qty 2

## 2019-08-15 MED ORDER — MAGNESIUM SULFATE 2 GM/50ML IV SOLN
2.0000 g | Freq: Once | INTRAVENOUS | Status: AC
  Administered 2019-08-15: 2 g via INTRAVENOUS
  Filled 2019-08-15: qty 50

## 2019-08-15 MED ORDER — OXYCODONE HCL 5 MG PO TABS
10.0000 mg | ORAL_TABLET | ORAL | Status: DC | PRN
  Administered 2019-08-15 – 2019-08-17 (×3): 10 mg via ORAL
  Filled 2019-08-15 (×3): qty 2

## 2019-08-15 MED ORDER — HYDROMORPHONE HCL 1 MG/ML IJ SOLN
2.0000 mg | INTRAMUSCULAR | Status: DC | PRN
  Administered 2019-08-18: 2 mg via INTRAVENOUS
  Filled 2019-08-15: qty 2

## 2019-08-15 MED ORDER — OXYCODONE HCL 5 MG PO TABS
15.0000 mg | ORAL_TABLET | ORAL | Status: DC
  Administered 2019-08-15 – 2019-08-16 (×6): 15 mg via ORAL
  Filled 2019-08-15 (×7): qty 3

## 2019-08-15 NOTE — Progress Notes (Signed)
Patient refused am labs. 

## 2019-08-15 NOTE — Progress Notes (Signed)
   08/15/19 1622  Clinical Encounter Type  Visited With Patient and family together;Health care provider  Visit Type Follow-up  Referral From Palliative care team  Consult/Referral To Chaplain  This chaplain joined the  Pt. sister-Chris, brother 'n law, and PMT-NP Alicia bedside with the Pt. The Pt. is alert, smiling, and conversational. Tears appear when Thayer Ohm offers the option of residential Hospice to her sister.  The Pt. Expresses her heart is set on going home. A pastoral presence was encouraged among the family.  Thayer Ohm shared the Pt. has a pastor she is close to.  The chaplain will explore the possibility of a phone call or visit from the pastor on Friday.  The invitation  of F/U spiritual care was accepted by the Pt. and family.

## 2019-08-15 NOTE — Progress Notes (Signed)
Palliative:  HPI: 64 y.o. 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 She has been started on broad spectrum antibiotics and does not believe she wants any additional surgery per notes. Palliative care requested to assist with goals of care conversation.   I met today with Brittany [name] who is awake. Her pain has been much improved and she was sleeping very well earlier in the day. However, shortly into our conversation her pain become much worse. Seems that she went too long without pain medication and now pain is again escalating. She shares with me that she is excited that her sister, Brittany [name], is arriving shortly and has flown in from [place]. We discussed plan to discuss with family and get symptoms better managed with hopes to return home with hospice care in the next few days (once symptoms better managed). She expresses that she is slowly finding peace that she is at end of life. She also expresses how she is finding acceptance through her faith in [place]. During our visit her sister, Brittany [name], arrived and I brought them up to the room.   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 Alayshia explained further that surgical intervention is very risky and likely to result in her death and she does not wish to pursue surgery. We discussed goal to better manage her pain and that she would like to go home with hospice. Brittany [name] would like to help support her  through this process. There is some concern voiced with home being the best environment for Brittany [name] and we did discuss hospice facility but Mileigh insists that she wishes to die at home.   Exam: Alert, oriented. Thin, frail. Grimacing with pain in left flank and back. HR normal. Breathing regular, unlabored. Abd flat, soft.   Plan:  Pain: She seems to have high tolerance with poor relief of pain so far (noted previous + toxicology). OxyIR increased to 15 mg every 4 hours SCHEDULED and OxIR 10 mg every 2 hours prn moderate/breakthrough pain. Dilaudid 2 mg IV every 4 hours prn severe pain/SOB. She already has fentanyl patch added 5/25 at 25 mcg/hr (consider increasing based on pain medication requirements over 24 hours). Gabapentin increased to TID per patient request. Robaxin prn muscle spasm.   Anxiety: Ativan 2 mg IV every 3 hours prn. Celexa 20 mg daily added per home dose.   Home with hospice when symptoms better controlled.   [id number] 90 min  Vinie Sill, NP Palliative Medicine Team Pager [phone] (Please see [email or web address] for schedule) Team Phone [phone]    Greater than 50%  of this time was spent counseling and coordinating care related to the above assessment and plan

## 2019-08-15 NOTE — Progress Notes (Signed)
PROGRESS NOTE  Brittany [name] WUJ:[id number] DOB: March 22, 1955   PCP: Sandrea Hughs, NP  Patient is from: Home.  DOA: 08/03/2019 LOS: 4  Brief Narrative / Interim history: 64 year old female with history of thoracic AA s/p endovascular graft on 05/11/2019 and 07/15/2019, aortic arch repair, CAD, CVA, HTN, malnutrition, COPD, GERD, chronic back pain,  chronic hep C, Alzheimer's, anxiety and depression who presented with back pain and abdominal pain.  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  Vascular surgery consulted.  Patient was admitted to ICU and managed medically with broad-spectrum antibiotics.  Vascular surgery recommended palliative care as she is not interested in additional surgery.   TRH assumed care on 5/26.  Infectious disease consulted and made some adjustments in antibiotics. Palliative care following.  Plan is for discharge home with hospice once pain fairly controlled and home hospice set up.  Subjective: Seen and examined earlier this morning.  No major events overnight of this morning.  Reports improvement in her pain with adjustment to her pain medication.  Slept better last night.  She got tearful as she told me her wish of going home with home hospice.  Objective: Vitals:   08/15/19 0314 08/15/19 0315 08/15/19 0749 08/15/19 0908  BP: (!) 141/71  109/64   Pulse: (!) 103  97 96  Resp: (!) 23  16 18   Temp: 98.3 F (36.8 C)  97.7 F (36.5 C)   TempSrc: Oral  Oral   SpO2: 99%  95% 95%  Weight:  44.7 kg    Height:        Intake/Output Summary (Last 24 hours) at 08/15/2019 1416 Last data filed at 08/15/2019 1300 Gross per 24 hour  Intake 590 ml  Output 700 ml  Net -110 ml   Filed Weights   08/13/19 0500  08/14/19 0455 08/15/19 0315  Weight: 44.1 kg 46.2 kg 44.7 kg    Examination:  GENERAL: No apparent distress.  Nontoxic. HEENT: MMM.  Vision and hearing grossly intact.  NECK: Supple.  No apparent JVD.  RESP:  No IWOB.  Fair aeration bilaterally. CVS:  RRR. Heart sounds normal.  ABD/GI/GU: BS+. Abd soft, NTND.  MSK/EXT:  Moves extremities. No apparent deformity. No edema.  SKIN: no apparent skin lesion or wound NEURO: Awake, alert and oriented appropriately.  No apparent focal neuro deficit. PSYCH: Calm. Normal affect.  Procedures:  None  Microbiology summarized: 5/23-COVID-19 PCR negative. 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 -On broad-spectrum antibiotics (CTX, vancomycin and Flagyl) per ID. -Optimal BP and HR control-started Coreg with as needed labetalol -Palliative care following-now DNR/DNI.  Eventual plan is to go home with home hospice.   History of CAD/CVA-no obvious focal deficit -Continue home medications  Chronic COPD: Stable.  No PFT in the chart.  -Dulera instead of home Symbicort.  Add Incruse Ellipta. -As needed Xopenex and Atrovent  Essential hypertension: On metoprolol and Lasix at home. -Coreg and as needed labetalol as above  Anxiety/depression/Alzheimer's dementia? -Continue Celexa -As needed Ativan added by palliative care.  Also on gabapentin.  Chronic back pain: On oxycodone and gabapentin  at home.  -Now on oxycodone, Robaxin and Dilaudid.  Pain improved.  Chronic hep C:  -Outpatient follow-up with ID  Debility -PT/OT  Goal of care discussion -Now DNR/DNI.   Severe malnutrition: Body mass index is 18.02 kg/m. -Consult dietitian            DVT prophylaxis: SCD Code Status: DNR/DNI Family Communication:  Patient and/or RN. Available if any question.  Status is: Inpatient  Remains inpatient appropriate because:Hemodynamically unstable, IV treatments appropriate due to intensity of illness or inability to take PO and Inpatient level of care appropriate due to severity of illness   Dispo: The patient is from: Home              Anticipated d/c is to: Home with home hospice               Anticipated d/c date is: 2 days              Patient currently is not medically stable to d/c.        Consultants:  Vascular surgery Infectious disease Palliative medicine   Sch Meds:  Scheduled Meds: . carvedilol  3.125 mg Oral BID WC  . citalopram  20 mg Oral Daily  . fentaNYL  1 patch Transdermal Q72H  . gabapentin  300 mg Oral BID  . mometasone-formoterol  2 puff Inhalation BID  . pantoprazole (PROTONIX) IV  40 mg Intravenous Q24H  . sodium chloride flush  3 mL Intravenous Once  . umeclidinium bromide  1 puff Inhalation Q2000   Continuous Infusions: . cefTRIAXone (ROCEPHIN)  IV Stopped (08/14/19 2133)  . metronidazole 500 mg (08/15/19 0931)  . vancomycin 500 mg (08/15/19 0511)   PRN Meds:.camphor-menthol, docusate sodium, HYDROmorphone (DILAUDID) injection, ipratropium, labetalol, levalbuterol, LORazepam, methocarbamol, oxyCODONE, polyethylene glycol  Antimicrobials: Anti-infectives (From admission, onward)   Start     Dose/Rate Route Frequency Ordered Stop   08/14/19 1800  cefTRIAXone (ROCEPHIN) 2 g in sodium chloride 0.9 % 100 mL IVPB     2 g 200 mL/hr over 30 Minutes Intravenous Every 24 hours 08/14/19 1519     08/14/19 1630  metroNIDAZOLE (FLAGYL) IVPB 500 mg     500 mg 100 mL/hr over 60 Minutes Intravenous Every 8 hours 08/14/19 1519     08/12/19 1800  vancomycin (VANCOREADY) IVPB 500 mg/100 mL     500 mg 100 mL/hr over 60 Minutes Intravenous Every 12 hours 08/12/19 0209     08/12/19 1000  ceFEPIme (MAXIPIME) 2 g in sodium chloride 0.9 % 100 mL IVPB  Status:  Discontinued      2 g 200 mL/hr over 30 Minutes Intravenous Every 12 hours 08/12/19 0209 08/14/19 1519   08/12/19 0800  metroNIDAZOLE (FLAGYL) IVPB 500 mg  Status:  Discontinued     500 mg 100 mL/hr over 60 Minutes Intravenous Every 8 hours 07/30/2019 2354 08/14/19 1420   07/21/2019 2245  ceFEPIme (MAXIPIME) 2 g in sodium chloride 0.9 % 100 mL IVPB     2 g 200 mL/hr over 30 Minutes Intravenous  Once 08/17/2019 2243 08/12/19 0050   08/01/2019 2245  metroNIDAZOLE (FLAGYL) IVPB 500 mg     500 mg 100 mL/hr over 60 Minutes Intravenous  Once 08/08/2019 2243 08/12/19 0159   08/06/2019 2245  vancomycin (VANCOCIN) IVPB 1000 mg/200 mL premix     1,000 mg 200 mL/hr over 60 Minutes Intravenous  Once 07/26/2019 2243 08/12/19 0309       I have personally reviewed the following  labs and images: CBC: Recent Labs  Lab 2019-08-12 2053 08/12/19 0617 08/13/19 0212 08/14/19 0526 08/15/19 0722  WBC 22.5* 24.6* 22.6* 16.7* 15.9*  NEUTROABS  --  22.7*  --   --   --   HGB 8.6* 9.1* 8.9* 9.7* 8.7*  HCT 27.3* 28.3* 28.1* 31.3* 28.5*  MCV 80.1 79.7* 81.4 81.1 82.1  PLT 399 414* 400 457* 452*   BMP &GFR Recent Labs  Lab 08/12/19 0617 08/12/19 1642 08/12/19 2010 08/12/19 2239 08/13/19 0212 08/13/19 0627 08/13/19 1310 08/14/19 0526 08/15/19 0722  NA 132*  --  136  --  135  --   --  136 136  K 2.8*   < > 5.3*   < > 4.2 3.6 3.9 3.7 3.4*  CL 99  --  102  --  99  --   --  104 105  CO2 20*  --  24  --  23  --   --  21* 21*  GLUCOSE 128*  --  84  --  46*  --   --  119* 125*  BUN 10  --  13  --  12  --   --  16 12  CREATININE 0.63  --  0.60  --  0.56  --   --  0.55 0.56  CALCIUM 8.1*  --  8.9  --  9.0  --   --  8.4* 8.1*  MG  --   --   --   --   --   --   --  1.8 1.5*   < > = values in this interval not displayed.   Estimated Creatinine Clearance: 52.1 mL/min (by C-G formula based on SCr of 0.56 mg/dL). Liver & Pancreas: Recent Labs  Lab 08/12/19 2053  AST 14*  ALT 13  ALKPHOS 59  BILITOT 1.0  PROT 5.8*  ALBUMIN 2.2*    Recent Labs  Lab 12-Aug-2019 2053  LIPASE 13   No results for input(s): AMMONIA in the last 168 hours. Diabetic: No results for input(s): HGBA1C in the last 72 hours. No results for input(s): GLUCAP in the last 168 hours. Cardiac Enzymes: No results for input(s): CKTOTAL, CKMB, CKMBINDEX, TROPONINI in the last 168 hours. No results for input(s): PROBNP in the last 8760 hours. Coagulation Profile: Recent Labs  Lab 08/12/19 0617  INR 1.3*   Thyroid Function Tests: No results for input(s): TSH, T4TOTAL, FREET4, T3FREE, THYROIDAB in the last 72 hours. Lipid Profile: No results for input(s): CHOL, HDL, LDLCALC, TRIG, CHOLHDL, LDLDIRECT in the last 72 hours. Anemia Panel: No results for input(s): VITAMINB12, FOLATE, FERRITIN, TIBC, IRON, RETICCTPCT in the last 72 hours. Urine analysis:    Component Value Date/Time   COLORURINE YELLOW 08/12/2019 0559   APPEARANCEUR CLEAR 08/12/2019 0559   LABSPEC >1.046 (H) 08/12/2019 0559   PHURINE 6.0 08/12/2019 0559   GLUCOSEU NEGATIVE 08/12/2019 0559   HGBUR NEGATIVE 08/12/2019 0559   BILIRUBINUR NEGATIVE 08/12/2019 0559   KETONESUR 5 (A) 08/12/2019 0559   PROTEINUR 30 (A) 08/12/2019 0559   UROBILINOGEN 0.2 07/11/2013 1933   NITRITE NEGATIVE 08/12/2019 0559   LEUKOCYTESUR NEGATIVE 08/12/2019 0559   Sepsis Labs: Invalid input(s): PROCALCITONIN, LACTICIDVEN  Microbiology: Recent Results (from the past 240 hour(s))  SARS Coronavirus 2 by RT PCR (hospital order, performed in [facility] hospital lab) Nasopharyngeal Nasopharyngeal Swab     Status: None   Collection Time: 08-12-19 10:22 PM   Specimen: Nasopharyngeal Swab  Result Value Ref Range Status  SARS Coronavirus 2 NEGATIVE NEGATIVE Final    Comment: (NOTE) SARS-CoV-2 target nucleic acids are NOT DETECTED. The SARS-CoV-2 RNA is generally detectable in upper and lower respiratory specimens during the acute phase of infection. The lowest concentration of SARS-CoV-2 viral copies this  assay can detect is 250 copies / mL. A negative result does not preclude SARS-CoV-2 infection and should not be used as the sole basis for treatment or other patient management decisions.  A negative result may occur with improper specimen collection / handling, submission of specimen other than nasopharyngeal swab, presence of viral mutation(s) within the areas targeted by this assay, and inadequate number of viral copies (<250 copies / mL). A negative result must be combined with clinical observations, patient history, and epidemiological information. Fact Sheet for Patients:   BoilerBrush.com.cy Fact Sheet for Healthcare Providers: https://pope.com/ This test is not yet approved or cleared  by the [place] FDA and has been authorized for detection and/or diagnosis of SARS-CoV-2 by FDA under an Emergency Use Authorization (EUA).  This EUA will remain in effect (meaning this test can be used) for the duration of the COVID-19 declaration under Section 564(b)(1) of the Act, 21 U.S.C. section 360bbb-3(b)(1), unless the authorization is terminated or revoked sooner. Performed at [facility], [address] [place], [place] [address]   Blood culture (routine x 2)     Status: None (Preliminary result)   Collection Time: 08-23-19 11:03 PM   Specimen: BLOOD RIGHT ARM  Result Value Ref Range Status   Specimen Description BLOOD RIGHT ARM  Final   Special Requests   Final    BOTTLES DRAWN AEROBIC AND ANAEROBIC Blood Culture adequate volume   Culture  Setup Time   Final    ANAEROBIC BOTTLE ONLY GRAM POSITIVE COCCI IN CHAINS Organism ID to follow CRITICAL RESULT CALLED TO, READ BACK BY AND VERIFIED WITH: K AMEND [facility] 08/15/19 0355  JDW    Culture   Final    NO GROWTH 4 DAYS Performed at [facility], [address] [place], [place] [address]    Report Status PENDING  Incomplete  Blood Culture ID Panel (Reflexed)      Status: None   Collection Time: 23-Aug-2019 11:03 PM  Result Value Ref Range Status   Enterococcus species NOT DETECTED NOT DETECTED Final   Listeria monocytogenes NOT DETECTED NOT DETECTED Final   Staphylococcus species NOT DETECTED NOT DETECTED Final   Staphylococcus aureus (BCID) NOT DETECTED NOT DETECTED Final   Streptococcus species NOT DETECTED NOT DETECTED Final   Streptococcus agalactiae NOT DETECTED NOT DETECTED Final   Streptococcus pneumoniae NOT DETECTED NOT DETECTED Final   Streptococcus pyogenes NOT DETECTED NOT DETECTED Final   Acinetobacter baumannii NOT DETECTED NOT DETECTED Final   Enterobacteriaceae species NOT DETECTED NOT DETECTED Final   Enterobacter cloacae complex NOT DETECTED NOT DETECTED Final   Escherichia coli NOT DETECTED NOT DETECTED Final   Klebsiella oxytoca NOT DETECTED NOT DETECTED Final   Klebsiella pneumoniae NOT DETECTED NOT DETECTED Final   Proteus species NOT DETECTED NOT DETECTED Final   Serratia marcescens NOT DETECTED NOT DETECTED Final   Haemophilus influenzae NOT DETECTED NOT DETECTED Final   Neisseria meningitidis NOT DETECTED NOT DETECTED Final   Pseudomonas aeruginosa NOT DETECTED NOT DETECTED Final   Candida albicans NOT DETECTED NOT DETECTED Final   Candida glabrata NOT DETECTED NOT DETECTED Final   Candida krusei NOT DETECTED NOT DETECTED Final   Candida parapsilosis NOT DETECTED NOT DETECTED Final  Candida tropicalis NOT DETECTED NOT DETECTED Final    Comment: Performed at [facility], [address] [place], [place] [address]  MRSA PCR Screening     Status: None   Collection Time: 08/12/19  1:59 AM   Specimen: Nasopharyngeal  Result Value Ref Range Status   MRSA by PCR NEGATIVE NEGATIVE Final    Comment:        The GeneXpert MRSA Assay (FDA approved for NASAL specimens only), is one component of a comprehensive MRSA colonization surveillance program. It is not intended to diagnose MRSA infection nor to guide or monitor  treatment for MRSA infections. Performed at [facility], [address] [place], [place] [address]   Blood culture (routine x 2)     Status: None (Preliminary result)   Collection Time: 08/12/19  6:17 AM   Specimen: BLOOD RIGHT ARM  Result Value Ref Range Status   Specimen Description BLOOD RIGHT ARM  Final   Special Requests AEROBIC BOTTLE ONLY Blood Culture adequate volume  Final   Culture   Final    NO GROWTH 3 DAYS Performed at [facility], [address] [place], [place] [address]    Report Status PENDING  Incomplete    Radiology Studies: No results found.   Leonce Bale T. Persephonie [facility]  If 7PM-7AM, please contact night-coverage [email or web address] Password [email or web address] 08/15/2019, 2:16 PM

## 2019-08-15 NOTE — Progress Notes (Signed)
[facility]  Subjective  -back pain better controlled today.   Objective 109/64 97 97.7 F (36.5 C) (Oral) 16 95%  Intake/Output Summary (Last 24 hours) at 08/15/2019 0856 Last data filed at 08/15/2019 0711 Gross per 24 hour  Intake 420 ml  Output 700 ml  Net -280 ml      Laboratory Lab Results: Recent Labs    08/14/19 0526 08/15/19 0722  WBC 16.7* 15.9*  HGB 9.7* 8.7*  HCT 31.3* 28.5*  PLT 457* 452*   BMET Recent Labs    08/14/19 0526 08/15/19 0722  NA 136 136  K 3.7 3.4*  CL 104 105  CO2 21* 21*  GLUCOSE 119* 125*  BUN 16 12  CREATININE 0.55 0.56  CALCIUM 8.4* 8.1*    COAG Lab Results  Component Value Date   INR 1.3 (H) 08/12/2019   INR 1.5 (H) 07/15/2019   INR 1.2 07/15/2019   No results found for: PTT  Assessment/Planning:  64 year old female initially treated with thoracic stent by Dr. Edilia Bo for symptomatic 5.5 cm thoracic aneurysm.  On subsequent follow-up was found to have degenerated her aorta on both ends of the stent with pseudoaneurysms.  Ultimately she subsequently underwent arch replacement with CT surgery and we did an antegrade thoracic stent to treat the distal pseudoaneurysm after further infectious work-up was initially negative.  She is continue to degenerate her aorta distally and now has a significant amount of gas in her thoracic aorta suggesting infectious mycotic process.  She is not interested in any further surgery.  Really appreciate palliative care spending a lot of time with her in the last several days.  Her goal is just to go home and spend time with her dog.  I think dispo to hospice would be more than appropriate.  Vascular surgery certainly available any additional questions or concerns arise.   Cephus Shelling 08/15/2019 8:56 AM --

## 2019-08-15 NOTE — Progress Notes (Signed)
PHARMACY - PHYSICIAN COMMUNICATION CRITICAL VALUE ALERT - BLOOD CULTURE IDENTIFICATION (BCID)  Brittany [name] is an 64 y.o. female who presented to [facility] on 08/20/2019 with a chief complaint of back and abdominal pain  Assessment:  Pt growing GPC chains in 1/3 blood culture bottles. ?contaminant  Name of physician (or Provider) Contacted: Katherina Right  Current antibiotics: Rocephin, Flagyl, Vancomycin  Changes to prescribed antibiotics recommended:  Patient is on recommended antibiotics - No changes needed  Results for orders placed or performed during the hospital encounter of August 20, 2019  Blood Culture ID Panel (Reflexed) (Collected: 2019/08/20 11:03 PM)  Result Value Ref Range   Enterococcus species NOT DETECTED NOT DETECTED   Listeria monocytogenes NOT DETECTED NOT DETECTED   Staphylococcus species NOT DETECTED NOT DETECTED   Staphylococcus aureus (BCID) NOT DETECTED NOT DETECTED   Streptococcus species NOT DETECTED NOT DETECTED   Streptococcus agalactiae NOT DETECTED NOT DETECTED   Streptococcus pneumoniae NOT DETECTED NOT DETECTED   Streptococcus pyogenes NOT DETECTED NOT DETECTED   Acinetobacter baumannii NOT DETECTED NOT DETECTED   Enterobacteriaceae species NOT DETECTED NOT DETECTED   Enterobacter cloacae complex NOT DETECTED NOT DETECTED   Escherichia coli NOT DETECTED NOT DETECTED   Klebsiella oxytoca NOT DETECTED NOT DETECTED   Klebsiella pneumoniae NOT DETECTED NOT DETECTED   Proteus species NOT DETECTED NOT DETECTED   Serratia marcescens NOT DETECTED NOT DETECTED   Haemophilus influenzae NOT DETECTED NOT DETECTED   Neisseria meningitidis NOT DETECTED NOT DETECTED   Pseudomonas aeruginosa NOT DETECTED NOT DETECTED   Candida albicans NOT DETECTED NOT DETECTED   Candida glabrata NOT DETECTED NOT DETECTED   Candida krusei NOT DETECTED NOT DETECTED   Candida parapsilosis NOT DETECTED NOT DETECTED   Candida tropicalis NOT DETECTED NOT DETECTED    Christoper Fabian, PharmD,  BCPS Please see amion for complete clinical pharmacist phone list 08/15/2019  4:01 AM

## 2019-08-15 NOTE — Progress Notes (Signed)
Patient sister Ander Slade here to visit her sister. She is aware that Palliative care is trying to get in touch with her., She has called and left message with them earlier this evening and she will call again in the morning. Patient other sister is coming from [place]. Tomorrow 08-15-19.

## 2019-08-15 NOTE — Progress Notes (Signed)
Initial Nutrition Assessment  DOCUMENTATION CODES:   Underweight, Severe malnutrition in context of chronic illness  INTERVENTION:   Liberalize diet to regular    Ensure Enlive po TID (strawberry), each supplement provides 350 kcal and 20 grams of protein  Provide MVI daily  NUTRITION DIAGNOSIS:   Severe Malnutrition related to chronic illness as evidenced by severe fat depletion, severe muscle depletion.  GOAL:   Patient will meet greater than or equal to 90% of their needs  MONITOR:   PO intake, Supplement acceptance, Weight trends, Labs, I & O's  REASON FOR ASSESSMENT:   Consult Assessment of nutrition requirement/status  ASSESSMENT:   Patient with PMH significant for Alzheimer's disease, COPD, CAD, HTN, CVA, and s/p thoracic aortic aneurysm repair (Feb 2021). 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   Pt does not want to pursue surgery. Likely going home with hospice.   Endorses loss in appetite over the last month. Typically took bites from each meal. Did not use supplementation. Appetite remains poor this admission. RD observed lunch tray with a few bites consumed. Pt willing to try Ensure for comfort as she enjoys the taste of strawberry ensure.   Reports a UBW of 105 lb. Records indicate pt weighed 120 lb on 11/20 and 96 lb this admission. Pt states this is likely due to fluid accumulation during her last hospital admission. She is sure she has lost dry weight but is unable to determine how much.    Drips: Mg sulfate  Labs: K 3.4 (L) Mg 1.5 (L)   NUTRITION - FOCUSED PHYSICAL EXAM:    Most Recent Value  Orbital Region  Severe depletion  Upper Arm Region  Moderate depletion  Thoracic and Lumbar Region  Severe depletion  Buccal Region  Severe depletion  Temple Region  Severe depletion  Clavicle Bone Region  Severe depletion  Clavicle and Acromion Bone Region  Severe depletion  Scapular Bone Region  Severe  depletion  Dorsal Hand  Severe depletion  Patellar Region  Severe depletion  Anterior Thigh Region  Severe depletion  Posterior Calf Region  Severe depletion  Edema (RD Assessment)  Mild  Hair  Reviewed  Eyes  Reviewed  Mouth  Reviewed  Skin  Reviewed  Nails  Reviewed     Diet Order:   Diet Order            Diet Heart Room service appropriate? No; Fluid consistency: Thin  Diet effective now              EDUCATION NEEDS:   Education needs have been addressed  Skin:  Skin Assessment: Skin Integrity Issues: Skin Integrity Issues:: Incisions Incisions: chest  Last BM:  5/24  Height:   Ht Readings from Last 1 Encounters:  08/07/2019 5\' 2"  (1.575 m)    Weight:   Wt Readings from Last 1 Encounters:  08/15/19 44.7 kg    BMI:  Body mass index is 18.02 kg/m.  Estimated Nutritional Needs:   Kcal:  1350-1550 kcal  Protein:  65-80 grams  Fluid:  >/= 1.3 L/day  08/17/19 RD, LDN Clinical Nutrition Pager listed in AMION

## 2019-08-15 NOTE — Care Management Important Message (Signed)
Important Message  Patient Details  Name: Brittany [name] MRN: [id number] Date of Birth: [birth year]   Medicare Important Message Given:  Yes     Renie Ora 08/15/2019, 2:38 PM

## 2019-08-16 DIAGNOSIS — E43 Unspecified severe protein-calorie malnutrition: Secondary | ICD-10-CM

## 2019-08-16 MED ORDER — ONDANSETRON HCL 4 MG/2ML IJ SOLN
4.0000 mg | Freq: Three times a day (TID) | INTRAMUSCULAR | Status: DC | PRN
  Administered 2019-08-16: 4 mg via INTRAVENOUS
  Filled 2019-08-16: qty 2

## 2019-08-16 MED ORDER — OXYCODONE HCL 5 MG PO TABS
20.0000 mg | ORAL_TABLET | ORAL | Status: DC
  Administered 2019-08-17 – 2019-08-18 (×6): 20 mg via ORAL
  Filled 2019-08-16 (×8): qty 4

## 2019-08-16 MED ORDER — SODIUM CHLORIDE 0.9 % IV SOLN: INTRAVENOUS | Status: DC

## 2019-08-16 NOTE — Progress Notes (Signed)
Palliative: [name], [name], is resting quietly in bed.  She appears chronically ill and frail.  She is alert and oriented, able to make her basic needs known.  Present today at bedside is Brittany [name], and Brittany [name].  We talk in detail about symptom management.  Brittany [name] states that her pain is at times 10 of 10, and after pain medication it reduces to 8/10.  She has been receiving her scheduled by mouth medication which overall seem to be effective.  She is needed to as needed doses in the last 18 hours.  Brittany [name] and I talked about symptom management, and she agrees that she would rather be mostly asleep and pain-free than awake and having pain.  I encouraged Brittany [name] and Brittany [name] to make use of as needed antianxiety medications as this may decrease her need for pain medications.  They state understanding and agreement.  Orders adjusted for increase in every 4 hours scheduled pain medication.  PMT to evaluate increase tomorrow.  We talked about disposition, home with hospice.  Brittany [name] is tearful stating that her goal is to go home.  She states her preferred place of death is home.  Sister, Brittany [name], asks relevant questions related to hospice care.  They share that they would need a hospital bed, and Brittany [name] is requesting an air mattress.  I encouraged Brittany [name] and Brittany [name] to lean on their hospice provider.  If they need additional services or supplies, call hospice first.  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  Conference with attending, bedside nursing staff, [facility] team related to goals of care, home with hospice, symptom management.  Plan:  Home with hospice care, transition to residential hospice when appropriate.    40 minutes Brittany Carmel, NP Palliative Medicine Team Team Phone # [phone] Greater than 50% of this time was spent counseling and coordinating care related to the above assessmetn and plan.

## 2019-08-16 NOTE — Progress Notes (Signed)
Had a little trouble waking patient to take her inhalers.  Patient's O2 sat was fluctuating as well.  Tried to get patient to give me a good cough, rhonchi and fine crackles on right side.  Placed patient on 2L [place] to give some support.  Was finally able to get patient to follow commands on [facility] medication.  RN made aware.

## 2019-08-16 NOTE — Progress Notes (Addendum)
Plan of care reviewed. Pt had anxiety, tearful, severe pain on her lower back scale 10/10. Alternated Oxycodone and Dilaudid, pain was not releived until Ativan given. She's able to rest better and appeared more relaxed.  Temp 97.7 - 99.7 F, orally, HR 90s-103, sinus rhythm /sinus tachycardia on monitor, BP 82/47 - 110/65 mmHg, RR 19-22, SPO2 90-94% on room air.  We will continue to monitor.  Filiberto Pinks, RN

## 2019-08-16 NOTE — Progress Notes (Signed)
PROGRESS NOTE  Brittany [name] EHU:[id number] DOB: [birth year]   PCP: Caesar Bookman, NP  Patient is from: Home.  DOA: September 08, 2019 LOS: 5  Brief Narrative / Interim history: 64 year old female with history of thoracic AA s/p endovascular graft on 05/11/2019 and 07/15/2019, aortic arch repair, CAD, CVA, HTN, malnutrition, COPD, GERD, chronic back pain,  chronic hep C, Alzheimer's, anxiety and depression who presented with back pain and abdominal pain.  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  Vascular surgery consulted.  Patient was admitted to ICU and managed medically with broad-spectrum antibiotics.  Vascular surgery recommended palliative care as she is not interested in additional surgery.   TRH assumed care on 5/26.  Infectious disease consulted and made some adjustments to antibiotic regimen. Palliative care consulted and transitioned to comfort care after meeting with patient and family.  Patient prefers to go home with home hospice versus residential hospice.  However, there is concern about adequate pain control given high tolerance and history of substance abuse.  Palliative care following and making adjustments.  Of note she is also on IV antibiotic, although I doubt utility of this at this time.  There is no plan of going home on IV antibiotics.  Subjective: Seen and examined earlier this morning.  No major events overnight of this morning.  She hopes to go home and die peacefully when it is time.  She is a still requiring significant dose of IV and oral pain medications.   Objective: Vitals:   08/16/19 0742 08/16/19 0846 08/16/19 1156 08/16/19 1613  BP: (!) 93/50  (!) 109/58 (!) 80/56  Pulse: 94  (!) 106 (!) 105  Resp: 16  19  18   Temp: 99.1 F (37.3 C)  98.2 F (36.8 C)   TempSrc: Axillary  Oral   SpO2: 90% 94% 92% 91%  Weight:      Height:        Intake/Output Summary (Last 24 hours) at 08/16/2019 1655 Last data filed at 08/16/2019 1000 Gross per 24 hour  Intake 1389.98 ml  Output 550 ml  Net 839.98 ml   Filed Weights   08/13/19 0500 08/14/19 0455 08/15/19 0315  Weight: 44.1 kg 46.2 kg 44.7 kg    Examination: GENERAL: No apparent distress.  Nontoxic. HEENT: MMM.  Vision and hearing grossly intact.  NECK: Supple.  No apparent JVD.  RESP: On room air.  No IWOB.  Fair aeration bilaterally. CVS:  RRR. Heart sounds normal.  ABD/GI/GU: BS+. Abd soft, NTND.  MSK/EXT:  Moves extremities. No apparent deformity. No edema.  SKIN: no apparent skin lesion or wound NEURO: Awake, alert and oriented appropriately.  No apparent focal neuro deficit. PSYCH: Calm. Normal affect.  Procedures:  None  Microbiology summarized: 5/23-COVID-19 PCR negative. 5/23-blood cultures NGTD  Assessment & Plan: End-of-life care/DNR/DNI -Palliative care following and making adjustments to pain medications.  Requiring significant dose of IV, oral and topical pain meds. -Plan to discharge home with home hospice but difficult situation given high pain tolerance and history of substance abuse.  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 -On broad-spectrum antibiotics (CTX, vancomycin and Flagyl) per ID. -  No plan for discharge on IV antibiotics.  History of CAD/CVA-no obvious focal deficit -Continue home medications  Chronic COPD: Stable.  No PFT in the chart.  -Continue Dulera, Incruse, Xopenex and Atrovent  Essential hypertension: On metoprolol and Lasix at home. -Coreg and as needed labetalol as above  Anxiety/depression/Alzheimer's  dementia? -Continue Celexa, Ativan, gabapentin  Chronic back pain: On oxycodone and gabapentin at home.  -On IV Dilaudid, OxyIR, fentanyl patch, gabapentin, Robaxin per palliative care.  Chronic hep C:  -Outpatient follow-up with ID  Debility -PT/OT  Severe malnutrition: Body mass index is 18.02 kg/m. -Appreciate dietitian input    Nutrition Problem: Severe Malnutrition Etiology: chronic illness Signs/Symptoms: severe fat depletion, severe muscle depletion Interventions: Ensure Enlive (each supplement provides 350kcal and 20 grams of protein), MVI   DVT prophylaxis: SCD Code Status: DNR/DNI Family Communication: Patient and/or RN. Available if any question.  Status is: Inpatient  Remains inpatient appropriate because:Ongoing active pain requiring inpatient pain management, IV treatments appropriate due to intensity of illness or inability to take PO and Inpatient level of care appropriate due to severity of illness   Dispo: The patient is from: Home              Anticipated d/c is to: Home with home hospice               Anticipated d/c date is: 2 days              Patient currently is not medically stable to d/c.        Consultants:  Vascular surgery Infectious disease Palliative medicine   Sch Meds:  Scheduled Meds: . carvedilol  3.125 mg Oral BID WC  . citalopram  20 mg Oral Daily  . feeding supplement (ENSURE ENLIVE)  237 mL Oral TID BM  . gabapentin  300 mg Oral TID  . mometasone-formoterol  2 puff Inhalation BID  . multivitamin with minerals  1 tablet Oral Daily  . oxyCODONE  15 mg Oral Q4H  . pantoprazole (PROTONIX) IV  40 mg Intravenous Q24H  . sodium chloride flush  3 mL Intravenous Once  . umeclidinium bromide  1 puff Inhalation Q2000   Continuous Infusions: . sodium chloride 10 mL/hr at 08/16/19 0536  . cefTRIAXone (ROCEPHIN)  IV 2 g (08/15/19 1826)  . metronidazole 500 mg (08/16/19 1614)  . vancomycin 500 mg (08/16/19 0537)   PRN  Meds:.camphor-menthol, docusate sodium, HYDROmorphone (DILAUDID) injection, ipratropium, labetalol, levalbuterol, LORazepam, methocarbamol, oxyCODONE, polyethylene glycol  Antimicrobials: Anti-infectives (From admission, onward)   Start     Dose/Rate Route Frequency Ordered Stop   08/14/19 1800  cefTRIAXone (ROCEPHIN) 2 g in sodium chloride 0.9 % 100 mL IVPB     2 g 200 mL/hr over 30 Minutes Intravenous Every 24 hours 08/14/19 1519     08/14/19 1630  metroNIDAZOLE (FLAGYL) IVPB 500 mg     500 mg 100 mL/hr over 60 Minutes Intravenous Every 8 hours 08/14/19 1519     08/12/19 1800  vancomycin (VANCOREADY) IVPB 500 mg/100 mL     500 mg 100 mL/hr over 60 Minutes Intravenous Every 12 hours 08/12/19 0209     08/12/19 1000  ceFEPIme (MAXIPIME) 2 g in sodium chloride 0.9 % 100 mL IVPB  Status:  Discontinued     2 g 200 mL/hr over 30 Minutes Intravenous Every 12 hours 08/12/19 0209 08/14/19 1519   08/12/19 0800  metroNIDAZOLE (FLAGYL) IVPB 500 mg  Status:  Discontinued     500 mg  100 mL/hr over 60 Minutes Intravenous Every 8 hours 08/06/2019 2354 08/14/19 1420   08/06/2019 2245  ceFEPIme (MAXIPIME) 2 g in sodium chloride 0.9 % 100 mL IVPB     2 g 200 mL/hr over 30 Minutes Intravenous  Once 07/26/2019 2243 08/12/19 0050   08/10/2019 2245  metroNIDAZOLE (FLAGYL) IVPB 500 mg     500 mg 100 mL/hr over 60 Minutes Intravenous  Once 07/27/2019 2243 08/12/19 0159   08/16/2019 2245  vancomycin (VANCOCIN) IVPB 1000 mg/200 mL premix     1,000 mg 200 mL/hr over 60 Minutes Intravenous  Once 08/16/2019 2243 08/12/19 0309       I have personally reviewed the following labs and images: CBC: Recent Labs  Lab 08/13/2019 2053 08/12/19 0617 08/13/19 0212 08/14/19 0526 08/15/19 0722  WBC 22.5* 24.6* 22.6* 16.7* 15.9*  NEUTROABS  --  22.7*  --   --   --   HGB 8.6* 9.1* 8.9* 9.7* 8.7*  HCT 27.3* 28.3* 28.1* 31.3* 28.5*  MCV 80.1 79.7* 81.4 81.1 82.1  PLT 399 414* 400 457* 452*   BMP &GFR Recent Labs  Lab  08/12/19 0617 08/12/19 1642 08/12/19 2010 08/12/19 2239 08/13/19 0212 08/13/19 0627 08/13/19 1310 08/14/19 0526 08/15/19 0722  NA 132*  --  136  --  135  --   --  136 136  K 2.8*   < > 5.3*   < > 4.2 3.6 3.9 3.7 3.4*  CL 99  --  102  --  99  --   --  104 105  CO2 20*  --  24  --  23  --   --  21* 21*  GLUCOSE 128*  --  84  --  46*  --   --  119* 125*  BUN 10  --  13  --  12  --   --  16 12  CREATININE 0.63  --  0.60  --  0.56  --   --  0.55 0.56  CALCIUM 8.1*  --  8.9  --  9.0  --   --  8.4* 8.1*  MG  --   --   --   --   --   --   --  1.8 1.5*   < > = values in this interval not displayed.   Estimated Creatinine Clearance: 52.1 mL/min (by C-G formula based on SCr of 0.56 mg/dL). Liver & Pancreas: Recent Labs  Lab 08/17/2019 2053  AST 14*  ALT 13  ALKPHOS 59  BILITOT 1.0  PROT 5.8*  ALBUMIN 2.2*   Recent Labs  Lab 07/30/2019 2053  LIPASE 13   No results for input(s): AMMONIA in the last 168 hours. Diabetic: No results for input(s): HGBA1C in the last 72 hours. No results for input(s): GLUCAP in the last 168 hours. Cardiac Enzymes: No results for input(s): CKTOTAL, CKMB, CKMBINDEX, TROPONINI in the last 168 hours. No results for input(s): PROBNP in the last 8760 hours. Coagulation Profile: Recent Labs  Lab 08/12/19 0617  INR 1.3*   Thyroid Function Tests: No results for input(s): TSH, T4TOTAL, FREET4, T3FREE, THYROIDAB in the last 72 hours. Lipid Profile: No results for input(s): CHOL, HDL, LDLCALC, TRIG, CHOLHDL, LDLDIRECT in the last 72 hours. Anemia Panel: No results for input(s): VITAMINB12, FOLATE, FERRITIN, TIBC, IRON, RETICCTPCT in the last 72 hours. Urine analysis:    Component Value Date/Time   COLORURINE YELLOW 08/12/2019 0559   APPEARANCEUR CLEAR 08/12/2019 0559   LABSPEC >1.046 (H)  08/12/2019 0559   PHURINE 6.0 08/12/2019 0559   GLUCOSEU NEGATIVE 08/12/2019 0559   HGBUR NEGATIVE 08/12/2019 0559   BILIRUBINUR NEGATIVE 08/12/2019 0559   KETONESUR  5 (A) 08/12/2019 0559   PROTEINUR 30 (A) 08/12/2019 0559   UROBILINOGEN 0.2 07/11/2013 1933   NITRITE NEGATIVE 08/12/2019 0559   LEUKOCYTESUR NEGATIVE 08/12/2019 0559   Sepsis Labs: Invalid input(s): PROCALCITONIN, [place]  Microbiology: Recent Results (from the past 240 hour(s))  SARS Coronavirus 2 by RT PCR (hospital order, performed in [facility] hospital lab) Nasopharyngeal Nasopharyngeal Swab     Status: None   Collection Time: 08/08/2019 10:22 PM   Specimen: Nasopharyngeal Swab  Result Value Ref Range Status   SARS Coronavirus 2 NEGATIVE NEGATIVE Final    Comment: (NOTE) SARS-CoV-2 target nucleic acids are NOT DETECTED. The SARS-CoV-2 RNA is generally detectable in upper and lower respiratory specimens during the acute phase of infection. The lowest concentration of SARS-CoV-2 viral copies this assay can detect is 250 copies / mL. A negative result does not preclude SARS-CoV-2 infection and should not be used as the sole basis for treatment or other patient management decisions.  A negative result may occur with improper specimen collection / handling, submission of specimen other than nasopharyngeal swab, presence of viral mutation(s) within the areas targeted by this assay, and inadequate number of viral copies (<250 copies / mL). A negative result must be combined with clinical observations, patient history, and epidemiological information. Fact Sheet for Patients:   StrictlyIdeas.no Fact Sheet for Healthcare Providers: BankingDealers.co.za This test is not yet approved or cleared  by the [place] FDA and has been authorized for detection and/or diagnosis of SARS-CoV-2 by FDA under an Emergency Use Authorization (EUA).  This EUA will remain in effect (meaning this test can be used) for the duration of the COVID-19 declaration under Section 564(b)(1) of the Act, 21 U.S.C. section 360bbb-3(b)(1), unless the  authorization is terminated or revoked sooner. Performed at [facility], [place] [address] [place], [place] [address]   Blood culture (routine x 2)     Status: None (Preliminary result)   Collection Time: 08/15/2019 11:03 PM   Specimen: BLOOD RIGHT ARM  Result Value Ref Range Status   Specimen Description BLOOD RIGHT ARM  Final   Special Requests   Final    BOTTLES DRAWN AEROBIC AND ANAEROBIC Blood Culture adequate volume   Culture  Setup Time   Final    ANAEROBIC BOTTLE ONLY GRAM POSITIVE COCCI IN CHAINS Organism ID to follow CRITICAL RESULT CALLED TO, READ BACK BY AND VERIFIED WITH: K AMEND PHARMD 08/15/19 0355  JDW    Culture   Final    CULTURE REINCUBATED FOR BETTER GROWTH CORRECTED ON 05/28 AT 1324: PREVIOUSLY REPORTED AS NO GROWTH 5 DAYS Performed at [facility], [place] [address] [place], [place] [address]    Report Status PENDING  Incomplete  Blood Culture ID Panel (Reflexed)     Status: None   Collection Time: 08/07/2019 11:03 PM  Result Value Ref Range Status   Enterococcus species NOT DETECTED NOT DETECTED Final   Listeria monocytogenes NOT DETECTED NOT DETECTED Final   Staphylococcus species NOT DETECTED NOT DETECTED Final   Staphylococcus aureus (BCID) NOT DETECTED NOT DETECTED Final   Streptococcus species NOT DETECTED NOT DETECTED Final   Streptococcus agalactiae NOT DETECTED NOT DETECTED Final   Streptococcus pneumoniae NOT DETECTED NOT DETECTED Final   Streptococcus pyogenes NOT DETECTED NOT DETECTED Final   Acinetobacter baumannii  NOT DETECTED NOT DETECTED Final   Enterobacteriaceae species NOT DETECTED NOT DETECTED Final   Enterobacter cloacae complex NOT DETECTED NOT DETECTED Final   Escherichia coli NOT DETECTED NOT DETECTED Final   Klebsiella oxytoca NOT DETECTED NOT DETECTED Final   Klebsiella pneumoniae NOT DETECTED NOT DETECTED Final   Proteus species NOT DETECTED NOT DETECTED Final   Serratia marcescens NOT DETECTED NOT DETECTED Final    Haemophilus influenzae NOT DETECTED NOT DETECTED Final   Neisseria meningitidis NOT DETECTED NOT DETECTED Final   Pseudomonas aeruginosa NOT DETECTED NOT DETECTED Final   Candida albicans NOT DETECTED NOT DETECTED Final   Candida glabrata NOT DETECTED NOT DETECTED Final   Candida krusei NOT DETECTED NOT DETECTED Final   Candida parapsilosis NOT DETECTED NOT DETECTED Final   Candida tropicalis NOT DETECTED NOT DETECTED Final    Comment: Performed at [facility], [address] [place], [place] [address]  MRSA PCR Screening     Status: None   Collection Time: 08/12/19  1:59 AM   Specimen: Nasopharyngeal  Result Value Ref Range Status   MRSA by PCR NEGATIVE NEGATIVE Final    Comment:        The GeneXpert MRSA Assay (FDA approved for NASAL specimens only), is one component of a comprehensive MRSA colonization surveillance program. It is not intended to diagnose MRSA infection nor to guide or monitor treatment for MRSA infections. Performed at [facility], [address], [place], [place] [address]   Blood culture (routine x 2)     Status: None (Preliminary result)   Collection Time: 08/12/19  6:17 AM   Specimen: BLOOD RIGHT ARM  Result Value Ref Range Status   Specimen Description BLOOD RIGHT ARM  Final   Special Requests AEROBIC BOTTLE ONLY Blood Culture adequate volume  Final   Culture   Final    NO GROWTH 4 DAYS Performed at [facility], [address], [place], [place] [address]    Report Status PENDING  Incomplete    Radiology Studies: No results found.   Brittany [name] T. Margerie Fraiser [facility]  If 7PM-7AM, please contact night-coverage [email or web address] Password [facility] 08/16/2019, 4:55 PM

## 2019-08-16 NOTE — TOC Initial Note (Addendum)
Transition of Care [facility]) - Initial/Assessment Note    Patient Details  Name: Brittany [name] MRN: [id number] Date of Birth: [birth year]  Transition of [facility]) CM/SW Contact:    Verdell Carmine, RN Phone Number: 08/16/2019, 5:04 PM  Clinical Narrative:                 Several discussions with Pallative care and Nursing. Patients sister came from out of town and is going to stay with patient.  Patient now wishes to go home on Hospice care. Pain still  To her is not controlled stating her pain is mostly a 8/10   Palliative has been ordering medications and antianxiety medication to assist with pain management. Awaiting MD assessment to  [facility] consult home hospice. The concern is of someone at home with substance abuse issues, patient does not want to go to residential hospice, but does agree that if she was to become unresponsive that she would go to residential, according to palliative NP. Marland Kitchen Discussed with Dr. Cyndia Skeeters, he is amenable to Hospice consult for home tomorrow if those are the patients wishes and her pain is adequately controlled. .   Plan on home tomorrow afternoon with hospice set up in the home.   [facility] has accepted the patient for hospice care. They will be calling the patient/ family.   Expected Discharge Plan: [place]     Patient Goals and CMS Choice Patient states their goals for this hospitalization and ongoing recovery are:: HOme with hospice      Expected Discharge Plan and Services Expected Discharge Plan: [place]   Discharge Planning Services: CM Consult Post Acute Care Choice: Hospice Living arrangements for the past 2 months: Single Family Home                                      Prior Living Arrangements/Services Living arrangements for the past 2 months: Single Family Home Lives with:: Adult Children Patient language and need for interpreter reviewed:: Yes Do you feel safe going back to the place where you live?:  Yes      Need for Family Participation in Patient Care: Yes (Comment) Care giver support system in place?: Yes (comment)   Criminal Activity/Legal Involvement Pertinent to Current Situation/Hospitalization: No - Comment as needed  Activities of Daily Living      Permission Sought/Granted                  Emotional Assessment Appearance:: Appears older than stated age         Psych Involvement: No (comment)  Admission diagnosis:  Aortic aneurysm ([place]) [I71.9] Vascular graft infection, initial encounter ([place]) [H82.7XXA] Patient Active Problem List   Diagnosis Date Noted  . Protein-calorie malnutrition, severe 08/15/2019  . Uncontrolled pain   . Goals of care, counseling/discussion   . Palliative care by specialist   . Vascular graft infection ([place])   . Sepsis ([place])   . H/O aortic arch repair 07/19/2019  . 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 Hypertensive urgency   . Aortic aneurysm ([place]) 07/11/2019  . Hypertensive emergency   . Acute coronary syndrome ([place]) 05/28/2019  . Thoracic aortic aneurysm, ruptured ([place]) 05/11/2019  . Cerebrovascular accident (CVA) ([place]) 02/19/2019  . Lacunar infarct, acute ([place]) 01/05/2019  . Altered mental status associated with intoxication ([place]) 01/05/2019  . Hypotension 01/05/2019  . Left wrist fracture 01/05/2019  .  AKI (acute kidney injury) (HCC) 01/05/2019  . Restless leg syndrome 07/10/2018  . Peripheral neuropathy 07/10/2018  . COPD (chronic obstructive pulmonary disease) (HCC) 10/05/2017  . GERD (gastroesophageal reflux disease) 10/05/2017  . Depression 10/05/2017  . Facial cellulitis-left 10/05/2017  . Diarrhea 10/05/2017  . Chronic viral hepatitis C (HCC) 06/20/2017  . Idiopathic peripheral neuropathy 05/19/2017  . Chronic low back pain without sciatica 05/19/2017  . Hyperglycemia 05/19/2017  . Chronic bronchitis (HCC) 03/08/2017  . Elevated LFTs 03/08/2017  . Current severe episode of major depressive disorder without  psychotic features (HCC) 03/08/2017  . GAD (generalized anxiety disorder) 03/08/2017  . Hepatomegaly 03/08/2017  . Acute exacerbation of chronic obstructive pulmonary disease (COPD) (HCC) 02/16/2017  . Acute respiratory failure with hypoxia (HCC)   . COPD with acute exacerbation (HCC) 01/27/2017  . CAP (community acquired pneumonia) 01/27/2017  . Tachycardia 01/27/2017  . COPD exacerbation (HCC) 01/27/2017  . Hypokalemia 01/27/2017  . Left arm numbness 11/28/2016    Class: Chronic  . Nondisplaced transverse fracture of left patella, initial encounter for closed fracture 06/10/2016  . Malnutrition of moderate degree (HCC) 07/12/2013  . Syncope 07/11/2013  . GASTROENTERITIS 05/07/2010  . TOBACCO ABUSE 07/01/2009  . Benign essential HTN 06/24/2009  . BRONCHITIS, ACUTE 06/24/2009   PCP:  Caesar Bookman, NP Pharmacy:   [id number] [place], [place] - [phone] [place] [address] Phone: [phone] Fax: [phone]     Social Determinants of Health (SDOH) Interventions    Readmission Risk Interventions Readmission Risk Prevention Plan 05/31/2019  Transportation Screening Complete  PCP or Specialist Appt within 3-5 Days Complete  HRI or Home Care Consult Complete  Social Work Consult for Recovery Care Planning/Counseling Complete  Palliative Care Screening Not Applicable  Medication Review Oceanographer) Complete  Some recent data might be hidden

## 2019-08-17 LAB — CULTURE, BLOOD (ROUTINE X 2)
Culture: NO GROWTH
Special Requests: ADEQUATE

## 2019-08-17 MED ORDER — LORAZEPAM 1 MG PO TABS
2.0000 mg | ORAL_TABLET | Freq: Four times a day (QID) | ORAL | Status: DC
  Administered 2019-08-18: 2 mg via ORAL
  Filled 2019-08-17: qty 2

## 2019-08-17 MED ORDER — MORPHINE SULFATE (CONCENTRATE) 10 MG /0.5 ML PO SOLN: 10.0000 mg | ORAL | 0 refills | Status: AC | PRN

## 2019-08-17 MED ORDER — GLYCOPYRROLATE 1 MG PO TABS: 1.0000 mg | ORAL_TABLET | Freq: Three times a day (TID) | ORAL | 0 refills | Status: AC | PRN

## 2019-08-17 MED ORDER — ONDANSETRON HCL 4 MG PO TABS: 4.0000 mg | ORAL_TABLET | Freq: Every day | ORAL | 1 refills | Status: AC | PRN

## 2019-08-17 MED ORDER — LORAZEPAM 0.5 MG PO TABS
0.5000 mg | ORAL_TABLET | ORAL | 0 refills | Status: AC | PRN
Start: 2019-08-17 — End: ?

## 2019-08-17 NOTE — Progress Notes (Signed)
Palliative:   Ms. Roslind, Michaux, is resting quietly in bed with her sister, Neysa Bonito at bedside. She appears acutely/chronically ill and quite frail. She is alert and oriented, able to make her basic needs known.  Family meeting with patient, sister, palliative medicine team, and transition of care team. It seems that, unfortunately, Lamara's home situation precludes her from safe discharge there. Vanessia has been renting a room from a friend, Neysa Bonito shares that there is no furniture in the room, not even a chair. We talked about safe disposition, and although Haden continues to state that her preference is to return home, this is clearly not possible as there is also no air conditioning in the home.  We talked about residential hospice, comfort and dignity. Neysa Bonito pleads with Jozalynn to accept residential hospice. As we are talking about disposition, Leea's pain levels increased dramatically and she began crying out in pain. I share with Tahliyah that when she has these issues, if she is at residential hospice there is a nurse right outside the door. As Neysa Bonito starts talking about disposition and also Stephanee's acute health concerns, Daniqua's pain levels escalate even further. The bedside phone rings, Valia's former husband is calling to check on her, see if Destinie's 14 year old granddaughter can visit. Ercel begins crying out that she is dying, and she does not want to die in pain. Nursing staff was notified and was bringing scheduled pain medicine prior to her entrance into the room. Transition of care team registered nurse Judeth Cornfield present providing psychosocial support and soothing comfort.  We talk in detail about the benefits of residential hospice including but not limited to family support being present, bedside nursing staff available quickly with additional medications as needed, visitation by her beloved pet, Sheryle Hail.  Pain and anxiety medications adjusted for maximum relief. Annayah is full comfort  care, and comfort and dignity are imperative. Treatments and medications are not focused on these have been discontinued. It is anticipated that she will experience an in hospital death as she is unable to accept residential hospice, and returning to her rented room with no air conditioning is not a safe discharge plan  Neysa Bonito and I talked in the hallway, along with Judeth Cornfield, RN, TOC. We talked about Amirra's existential suffering. Psychosocial support also given to [facility] and her living care of her sister is recognized.  Conference with attending, bedside nursing staff, transition of care team related to patient condition, needs, goals of care, safe discharge plan.  Plan: Comfort measures, antibiotics stopped. Anticipate in hospital death  65 minutes, extended time   Lillia Carmel, NP Palliative medicine team Team phone [phone] Greater than 62% of this time was spent counseling and coordinating care related to the above assessment and plan.

## 2019-08-17 NOTE — TOC Progression Note (Signed)
Transition of Care [facility]) - Progression Note    Patient Details  Name: Brittany [name] MRN: [id number] Date of Birth: 11/17/1955  Transition of Care [facility]) CM/SW [place], RN Phone Number: 08/17/2019, 12:56 PM  Clinical Narrative:    Met with Palliative, patient, Sister Alyse Low. Started to discuss disposition. Patients pain substantially increased. Alyse Low expressed wishes to have her sister go to residential hospice. The room that the patient was renting is not habitable according to [place] and her her husband who when speaking out of the room stated that he would not allow his wife to be at that house because it is not safe, it is a dangerous neighborhood. The house does not have air conditioning, and is not at this time a safe plan for the patients discharge.  At this time the patient refuses to go to residential hospice, has increase pain when speaking about not going home. At this time, the alternative is to provide comfort measures and end of life comfort here in the hospital setting. Tasha from  Pallative with discuss with Dr Cyndia Skeeters. NurseJessica updated. Patient calmed with guided imagery  Music, and medication. Dianna from [facility] updated about Hospice.at home. This is on  Hold.   Expected Discharge Plan: [place]    Expected Discharge Plan and Services Expected Discharge Plan: [place]   Discharge Planning Services: CM Consult Post Acute Care Choice: Hospice Living arrangements for the past 2 months: Single Family Home                                       Social Determinants of Health (SDOH) Interventions    Readmission Risk Interventions Readmission Risk Prevention Plan 05/31/2019  Transportation Screening Complete  PCP or Specialist Appt within 3-5 Days Complete  HRI or [place] Complete  Social Work Consult for [place] Planning/Counseling Complete  Palliative Care Screening Not Applicable  Medication  Review Press photographer) Complete  Some recent data might be hidden

## 2019-08-17 NOTE — Progress Notes (Signed)
Spoke to leadership and [facility]. Patient allowed to have 10-20 minute visit with 62 year old granddaughter.  Ginette Otto, RN

## 2019-08-17 NOTE — Progress Notes (Signed)
Pt transferred from [facility], pt is drowsy, alert to voice, too weak to answer questions, on comfort care.

## 2019-08-17 NOTE — Progress Notes (Signed)
300 mg gabapentin wasted in trash with Edwin Dada, RN

## 2019-08-17 NOTE — Progress Notes (Signed)
Patient transferred to [address] along with her belongings and accompanied by her sister.

## 2019-08-17 NOTE — Care Management (Signed)
Late entry  5/28 1835 Spoke to [place], patients sister in the room. Apparently patient is just renting a room.from from someone. T . Spoke to [place] from [place]. Who states she has spoken to Sachse the other sisiter who was at the patients home.here is no ac in the house, and the room is no liveable at this time.  The roommate is trying to get window unit air conditioners  for tomorrow. Sister Neysa Bonito wants to take the patient home to [place] . I spoke to [place] and said that  The other sisiter Joy lives here in [place]. Benetta Spar was going to call Neysa Bonito back to see if both sisters could talk and perhaps a plan can be made for Joy's house or residential hospice. Will continue to follow

## 2019-08-17 NOTE — Progress Notes (Signed)
PROGRESS NOTE  Brittany [name] SWF:[id number] DOB: 01-11-1956   PCP: Caesar Bookman, NP  Patient is from: Home.  DOA: 07/23/2019 LOS: 6  Brief Narrative / Interim history: 64 year old female with history of thoracic AA s/p endovascular graft on 05/11/2019 and 07/15/2019, aortic arch repair, CAD, CVA, HTN, malnutrition, COPD, GERD, chronic back pain,  chronic hep C, Alzheimer's, anxiety and depression who presented with back pain and abdominal pain.  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  Vascular surgery consulted.  Patient was admitted to ICU and managed medically with broad-spectrum antibiotics.  Vascular surgery recommended palliative care as she is not interested in additional surgery.   TRH assumed care on 5/26.  Infectious disease consulted and made some adjustments to antibiotic regimen. Palliative care consulted and transitioned to comfort care after meeting with patient and family.  Patient insists on going home with home hospice versus residential hospice.  However, patient lives in rental room with no furniture or air conditioning, and she is still in significant amount of pain requiring significant dose of pain medications.   Subjective: Seen and examined earlier this morning.  No major events overnight of this morning.  Still with significant pain.  Continues to refuse residential hospice.   Objective: Vitals:   08/16/19 2349 08/17/19 0356 08/17/19 0400 08/17/19 0728  BP: (!) 87/53 (!) 88/49 (!) 102/58 93/60  Pulse: (!) 110 (!) 111 (!) 111 (!) 107  Resp: 20 (!) 24 (!) 25 (!) 24  Temp: 99.2 F (37.3 C) 98.4 F (36.9 C)  98.3 F (36.8 C)  TempSrc: Oral Oral  Oral  SpO2: 100% 97% 99% 97%  Weight:      Height:         Intake/Output Summary (Last 24 hours) at 08/17/2019 1951 Last data filed at 08/17/2019 1500 Gross per 24 hour  Intake 410 ml  Output --  Net 410 ml   Filed Weights   08/13/19 0500 08/14/19 0455 08/15/19 0315  Weight: 44.1 kg 46.2 kg 44.7 kg    Examination: GENERAL: Appears to be in distress from pain HEENT: MMM.  Vision and hearing grossly intact.  NECK: Supple.  No apparent JVD.  RESP: On room air.  Some increased work of breathing MSK/EXT:  Moves extremities. No apparent deformity.  SKIN: no apparent skin lesion or wound NEURO: Awake, alert and oriented appropriately.    Procedures:  None  Microbiology summarized: 5/23-COVID-19 PCR negative. 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  Vascular surgery signed off. -Discontinued antibiotics.   History of CAD/CVA-no obvious focal deficit  Chronic COPD: Stable. -Nebulizers as needed  Essential hypertension: On metoprolol and Lasix at home. -Coreg and as needed labetalol as above  Anxiety/depression/Alzheimer's dementia? -Ativan and Dilaudid per PMT  Chronic back pain: On oxycodone and gabapentin at home.  -On IV Dilaudid, OxyIR, gabapentin, Robaxin per palliative care.  Chronic hep C:   Debility  Severe malnutrition: Body mass index is 18.02 kg/m.  Nutrition Problem: Severe Malnutrition Etiology: chronic illness Signs/Symptoms: severe fat depletion, severe muscle depletion Interventions: Ensure Enlive (each supplement  provides 350kcal and 20 grams of protein), MVI   DVT prophylaxis: SCD Code Status: DNR/DNI Family Communication: Patient and/or RN. Available if any question.  Status is: Inpatient  Remains inpatient appropriate because:Ongoing  active pain requiring inpatient pain management and Unsafe d/c plan   Dispo: The patient is from: Home              Anticipated d/c is to: In hospital death anticipated.                          Consultants:  Vascular surgery Infectious disease Palliative medicine   Sch Meds:  Scheduled Meds: . gabapentin  300 mg Oral TID  . LORazepam  2 mg Oral Q6H  . oxyCODONE  20 mg Oral Q4H  . sodium chloride flush  3 mL Intravenous Once   Continuous Infusions:  PRN Meds:.camphor-menthol, docusate sodium, HYDROmorphone (DILAUDID) injection, ipratropium, labetalol, levalbuterol, methocarbamol, ondansetron (ZOFRAN) IV, oxyCODONE  Antimicrobials: Anti-infectives (From admission, onward)   Start     Dose/Rate Route Frequency Ordered Stop   08/14/19 1800  cefTRIAXone (ROCEPHIN) 2 g in sodium chloride 0.9 % 100 mL IVPB  Status:  Discontinued     2 g 200 mL/hr over 30 Minutes Intravenous Every 24 hours 08/14/19 1519 08/17/19 1316   08/14/19 1630  metroNIDAZOLE (FLAGYL) IVPB 500 mg  Status:  Discontinued     500 mg 100 mL/hr over 60 Minutes Intravenous Every 8 hours 08/14/19 1519 08/17/19 1316   08/12/19 1800  vancomycin (VANCOREADY) IVPB 500 mg/100 mL  Status:  Discontinued     500 mg 100 mL/hr over 60 Minutes Intravenous Every 12 hours 08/12/19 0209 08/17/19 1316   08/12/19 1000  ceFEPIme (MAXIPIME) 2 g in sodium chloride 0.9 % 100 mL IVPB  Status:  Discontinued     2 g 200 mL/hr over 30 Minutes Intravenous Every 12 hours 08/12/19 0209 08/14/19 1519   08/12/19 0800  metroNIDAZOLE (FLAGYL) IVPB 500 mg  Status:  Discontinued     500 mg 100 mL/hr over 60 Minutes Intravenous Every 8 hours 09-08-19 2354 08/14/19 1420   September 08, 2019 2245  ceFEPIme (MAXIPIME) 2 g in sodium chloride 0.9 % 100 mL IVPB     2 g 200 mL/hr over 30 Minutes Intravenous  Once 09/08/2019 2243 08/12/19 0050   09-08-2019 2245  metroNIDAZOLE (FLAGYL) IVPB 500 mg     500 mg 100 mL/hr over 60 Minutes Intravenous  Once Sep 08, 2019  2243 08/12/19 0159   08-Sep-2019 2245  vancomycin (VANCOCIN) IVPB 1000 mg/200 mL premix     1,000 mg 200 mL/hr over 60 Minutes Intravenous  Once 09-08-19 2243 08/12/19 0309       I have personally reviewed the following labs and images: CBC: Recent Labs  Lab 09/08/19 2053 08/12/19 0617 08/13/19 0212 08/14/19 0526 08/15/19 0722  WBC 22.5* 24.6* 22.6* 16.7* 15.9*  NEUTROABS  --  22.7*  --   --   --   HGB 8.6* 9.1* 8.9* 9.7* 8.7*  HCT 27.3* 28.3* 28.1* 31.3* 28.5*  MCV 80.1 79.7* 81.4 81.1 82.1  PLT 399 414* 400 457* 452*   BMP &GFR Recent Labs  Lab 08/12/19 0617 08/12/19 1642 08/12/19 2010 08/12/19 2239 08/13/19 0212 08/13/19 0627 08/13/19 1310 08/14/19 0526 08/15/19 0722  NA 132*  --  136  --  135  --   --  136 136  K 2.8*   < > 5.3*   < > 4.2 3.6 3.9 3.7  3.4*  CL 99  --  102  --  99  --   --  104 105  CO2 20*  --  24  --  23  --   --  21* 21*  GLUCOSE 128*  --  84  --  46*  --   --  119* 125*  BUN 10  --  13  --  12  --   --  16 12  CREATININE 0.63  --  0.60  --  0.56  --   --  0.55 0.56  CALCIUM 8.1*  --  8.9  --  9.0  --   --  8.4* 8.1*  MG  --   --   --   --   --   --   --  1.8 1.5*   < > = values in this interval not displayed.   Estimated Creatinine Clearance: 52.1 mL/min (by C-G formula based on SCr of 0.56 mg/dL). Liver & Pancreas: Recent Labs  Lab 08/19/2019 2053  AST 14*  ALT 13  ALKPHOS 59  BILITOT 1.0  PROT 5.8*  ALBUMIN 2.2*   Recent Labs  Lab 08/14/2019 2053  LIPASE 13   No results for input(s): AMMONIA in the last 168 hours. Diabetic: No results for input(s): HGBA1C in the last 72 hours. No results for input(s): GLUCAP in the last 168 hours. Cardiac Enzymes: No results for input(s): CKTOTAL, CKMB, CKMBINDEX, TROPONINI in the last 168 hours. No results for input(s): PROBNP in the last 8760 hours. Coagulation Profile: Recent Labs  Lab 08/12/19 0617  INR 1.3*   Thyroid Function Tests: No results for input(s): TSH, T4TOTAL, FREET4,  T3FREE, THYROIDAB in the last 72 hours. Lipid Profile: No results for input(s): CHOL, HDL, LDLCALC, TRIG, CHOLHDL, LDLDIRECT in the last 72 hours. Anemia Panel: No results for input(s): VITAMINB12, FOLATE, FERRITIN, TIBC, IRON, RETICCTPCT in the last 72 hours. Urine analysis:    Component Value Date/Time   COLORURINE YELLOW 08/12/2019 0559   APPEARANCEUR CLEAR 08/12/2019 0559   LABSPEC >1.046 (H) 08/12/2019 0559   PHURINE 6.0 08/12/2019 0559   GLUCOSEU NEGATIVE 08/12/2019 0559   HGBUR NEGATIVE 08/12/2019 0559   BILIRUBINUR NEGATIVE 08/12/2019 0559   KETONESUR 5 (A) 08/12/2019 0559   PROTEINUR 30 (A) 08/12/2019 0559   UROBILINOGEN 0.2 07/11/2013 1933   NITRITE NEGATIVE 08/12/2019 0559   LEUKOCYTESUR NEGATIVE 08/12/2019 0559   Sepsis Labs: Invalid input(s): PROCALCITONIN, LACTICIDVEN  Microbiology: Recent Results (from the past 240 hour(s))  SARS Coronavirus 2 by RT PCR (hospital order, performed in [facility] hospital lab) Nasopharyngeal Nasopharyngeal Swab     Status: None   Collection Time: 08/04/2019 10:22 PM   Specimen: Nasopharyngeal Swab  Result Value Ref Range Status   SARS Coronavirus 2 NEGATIVE NEGATIVE Final    Comment: (NOTE) SARS-CoV-2 target nucleic acids are NOT DETECTED. The SARS-CoV-2 RNA is generally detectable in upper and lower respiratory specimens during the acute phase of infection. The lowest concentration of SARS-CoV-2 viral copies this assay can detect is 250 copies / mL. A negative result does not preclude SARS-CoV-2 infection and should not be used as the sole basis for treatment or other patient management decisions.  A negative result may occur with improper specimen collection / handling, submission of specimen other than nasopharyngeal swab, presence of viral mutation(s) within the areas targeted by this assay, and inadequate number of viral copies (<250 copies / mL). A negative result must be combined with clinical observations, patient  history,  and epidemiological information. Fact Sheet for Patients:   BoilerBrush.com.cyhttps://www.fda.gov/media/136312/download Fact Sheet for Healthcare Providers: https://pope.com/https://www.fda.gov/media/136313/download This test is not yet approved or cleared  by the [place] FDA and has been authorized for detection and/or diagnosis of SARS-CoV-2 by FDA under an Emergency Use Authorization (EUA).  This EUA will remain in effect (meaning this test can be used) for the duration of the COVID-19 declaration under Section 564(b)(1) of the Act, 21 U.S.C. section 360bbb-3(b)(1), unless the authorization is terminated or revoked sooner. Performed at [facility], [address] [place], [place] [address]   Blood culture (routine x 2)     Status: None (Preliminary result)   Collection Time: 07/29/2019 11:03 PM   Specimen: BLOOD RIGHT ARM  Result Value Ref Range Status   Specimen Description BLOOD RIGHT ARM  Final   Special Requests   Final    BOTTLES DRAWN AEROBIC AND ANAEROBIC Blood Culture adequate volume   Culture  Setup Time   Final    ANAEROBIC BOTTLE ONLY GRAM POSITIVE COCCI IN CHAINS CRITICAL RESULT CALLED TO, READ BACK BY AND VERIFIED WITH: K AMEND PHARMD 08/15/19 0355  JDW    Culture   Final    CULTURE REINCUBATED FOR BETTER GROWTH CORRECTED ON 05/28 AT [id number]: PREVIOUSLY REPORTED AS NO GROWTH 5 DAYS Performed at [facility], [address] [place], [place] [address]    Report Status PENDING  Incomplete  Blood Culture ID Panel (Reflexed)     Status: None   Collection Time: 07/31/2019 11:03 PM  Result Value Ref Range Status   Enterococcus species NOT DETECTED NOT DETECTED Final   Listeria monocytogenes NOT DETECTED NOT DETECTED Final   Staphylococcus species NOT DETECTED NOT DETECTED Final   Staphylococcus aureus (BCID) NOT DETECTED NOT DETECTED Final   Streptococcus species NOT DETECTED NOT DETECTED Final   Streptococcus agalactiae NOT DETECTED NOT DETECTED Final   Streptococcus pneumoniae  NOT DETECTED NOT DETECTED Final   Streptococcus pyogenes NOT DETECTED NOT DETECTED Final   Acinetobacter baumannii NOT DETECTED NOT DETECTED Final   Enterobacteriaceae species NOT DETECTED NOT DETECTED Final   Enterobacter cloacae complex NOT DETECTED NOT DETECTED Final   Escherichia coli NOT DETECTED NOT DETECTED Final   Klebsiella oxytoca NOT DETECTED NOT DETECTED Final   Klebsiella pneumoniae NOT DETECTED NOT DETECTED Final   Proteus species NOT DETECTED NOT DETECTED Final   Serratia marcescens NOT DETECTED NOT DETECTED Final   Haemophilus influenzae NOT DETECTED NOT DETECTED Final   Neisseria meningitidis NOT DETECTED NOT DETECTED Final   Pseudomonas aeruginosa NOT DETECTED NOT DETECTED Final   Candida albicans NOT DETECTED NOT DETECTED Final   Candida glabrata NOT DETECTED NOT DETECTED Final   Candida krusei NOT DETECTED NOT DETECTED Final   Candida parapsilosis NOT DETECTED NOT DETECTED Final   Candida tropicalis NOT DETECTED NOT DETECTED Final    Comment: Performed at [facility], [address] [place], [place] [address]  MRSA PCR Screening     Status: None   Collection Time: 08/12/19  1:59 AM   Specimen: Nasopharyngeal  Result Value Ref Range Status   MRSA by PCR NEGATIVE NEGATIVE Final    Comment:        The GeneXpert MRSA Assay (FDA approved for NASAL specimens only), is one component of a comprehensive MRSA colonization surveillance program. It is not intended to diagnose MRSA infection nor to guide or monitor treatment for MRSA infections. Performed at [facility], [address], [place], [place] [address]   Blood culture (routine x 2)     Status: None   Collection Time: 08/12/19  6:17 AM   Specimen: BLOOD RIGHT ARM  Result Value Ref Range Status   Specimen Description BLOOD RIGHT ARM  Final   Special Requests AEROBIC BOTTLE ONLY Blood Culture adequate volume  Final   Culture   Final    NO GROWTH 5 DAYS Performed at [facility],  [address], [place], [place] [address]    Report Status 08/17/2019 FINAL  Final    Radiology Studies: No results found.   Kari Kerth T. Abbigayle Toole [facility]  If 7PM-7AM, please contact night-coverage [email or web address] Password [facility] 08/17/2019, 7:51 PM

## 2019-08-18 DIAGNOSIS — I729 Aneurysm of unspecified site: Secondary | ICD-10-CM | POA: Diagnosis present

## 2019-08-18 DIAGNOSIS — Z66 Do not resuscitate: Secondary | ICD-10-CM

## 2019-08-18 DIAGNOSIS — Z789 Other specified health status: Secondary | ICD-10-CM

## 2019-08-18 DIAGNOSIS — G894 Chronic pain syndrome: Secondary | ICD-10-CM

## 2019-08-18 LAB — CULTURE, BLOOD (ROUTINE X 2): Special Requests: ADEQUATE

## 2019-08-20 ENCOUNTER — Encounter: Payer: Medicare HMO | Admitting: Vascular Surgery

## 2019-08-20 NOTE — Death Summary Note (Signed)
DEATH SUMMARY   Patient Details  Name: Brittany [name] MRN: [id number] DOB: 01-Dec-1955  Admission/Discharge Information   Admit Date:  2019-09-05  Date of Death: Date of Death: September 12, 2019  Time of Death: Time of Death: 0643  Length of Stay: 7  Referring Physician: Sandrea Hughs, NP   Reason(s) for Hospitalization    Diagnoses  Preliminary cause of death: End of life care Secondary Diagnoses (including complications and co-morbidities):  Principal Problem:   End of life care Active Problems:   TOBACCO ABUSE   COPD with chronic bronchitis ([place])   GAD (generalized anxiety disorder)   Depression   Cerebrovascular accident (CVA) ([place])   Thoracic aortic aneurysm ([place])   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 year old female with history of thoracic AA s/p endovascular graft on 05/11/2019 and 07/15/2019, aortic arch repair, CAD, CVA, HTN, malnutrition, COPD, GERD, chronic back pain,  chronic hep C, Alzheimer's, anxiety and depression who presented with back pain and abdominal pain.  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  Vascular surgery consulted.  Patient was admitted to ICU and managed medically with broad-spectrum antibiotics.  Vascular surgery recommended palliative care as she is not interested in additional surgery.   TRH assumed care on 5/26.  Infectious disease consulted and made some  adjustments to antibiotic regimen. Palliative care consulted and transitioned to comfort care after meeting with patient and family.  Patient insists on going home with home hospice versus residential hospice.  However, patient's rental room was found to be in a very poor and inhabitable condition.  Eventually, patient passed away on Sep 12, 2022 as 6:43 AM.  Met with patient's family at bedside.  All questions answered.  Emotional support provided.  Patient and family appreciated the care she received from everyone involved.  They believe everyone is in peace now that she is no longer in pain.  See individual problem list below for more hospital course.  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  Vascular surgery signed off. -Discontinued antibiotics.   History of CAD/CVA-no obvious focal deficit  Chronic COPD: Stable. -Nebulizers as needed  Essential hypertension: On metoprolol and Lasix at home. -Coreg and as needed labetalol as above  Anxiety/depression/Alzheimer's dementia? -Ativan and Dilaudid per PMT  Chronic back pain: On oxycodone and gabapentin at home.  -On IV Dilaudid, OxyIR, gabapentin, Robaxin per palliative care.  Chronic hep C:   Debility  Severe malnutrition: Body mass index is 18.02 kg/m.  Nutrition Problem: Severe Malnutrition Etiology: chronic illness Signs/Symptoms: severe fat depletion, severe muscle depletion Interventions: Ensure Enlive (each supplement provides 350kcal and 20 grams of protein), MVI  Pertinent Labs and Studies  Significant Diagnostic Studies DG Chest 2 View  Result Date: 08/05/2019 CLINICAL DATA:  Status post aortic arch  repair EXAM: CHEST - 2 VIEW COMPARISON:  07/29/2019 FINDINGS: Cardiac shadow is stable. Aortic stent  graft is again noted in the descending thoracic aorta. Postsurgical changes are noted. No focal infiltrate or sizable effusion is seen. No acute bony abnormality is noted. Chronic changes in the right humeral head are seen. IMPRESSION: Status post aortic stent graft.  No acute abnormality noted. Electronically Signed   By: Inez Catalina M.D.   On: 08/05/2019 11:06   DG Chest 2 View  Result Date: 07/29/2019 CLINICAL DATA:  Recent fall with left-sided pain, initial encounter EXAM: CHEST - 2 VIEW COMPARISON:  07/22/2019 FINDINGS: Cardiac shadow is stable. Aortic stent graft is again seen. Improvement in previously seen left pleural effusion is noted. No pneumothorax or focal infiltrate is seen. No acute bony injury is noted. IMPRESSION: No active cardiopulmonary disease. Electronically Signed   By: Inez Catalina M.D.   On: 07/29/2019 17:46   DG Wrist Complete Left  Result Date: 07/29/2019 CLINICAL DATA:  Recent fall 1 day ago with wrist pain, initial encounter EXAM: LEFT WRIST - COMPLETE 3+ VIEW COMPARISON:  None. FINDINGS: There are changes consistent with prior distal radial and ulnar fracture with healing particularly within the distal radius. Posterior angulation remains at the fracture site. No new fracture is seen. IMPRESSION: Changes consistent with prior distal radial and ulnar fractures with healing. Electronically Signed   By: Inez Catalina M.D.   On: 07/29/2019 17:43   CT Head Wo Contrast  Result Date: 07/29/2019 CLINICAL DATA:  Head trauma, headache. Spine fracture, cervical, traumatic. Additional history provided: Mechanical fall. EXAM: CT HEAD WITHOUT CONTRAST CT CERVICAL SPINE WITHOUT CONTRAST TECHNIQUE: Multidetector CT imaging of the head and cervical spine was performed following the standard protocol without intravenous contrast. Multiplanar CT image reconstructions of the cervical spine were also generated. COMPARISON:  Brain MRI 01/05/2019, head CT 01/05/2019, CT cervical spine 01/05/2019  FINDINGS: CT HEAD FINDINGS Brain: Redemonstrated chronic cortically based infarcts within the right frontal, parietal and occipital lobes. Unchanged chronic lacunar infarct within the right cerebellum. Ill-defined hypoattenuation within the cerebral white matter is nonspecific, but consistent with chronic small vessel ischemic disease. Stable, mild generalized parenchymal atrophy. There is no acute intracranial hemorrhage. No acute demarcated cortical infarct is demonstrated. No extra-axial fluid collection. No evidence of intracranial mass. No midline shift. Vascular: No hyperdense vessel.  Atherosclerotic calcifications. Skull: Normal. Negative for fracture or focal lesion. Sinuses/Orbits: Visualized orbits show no acute finding. Mild ethmoid sinus mucosal thickening. No significant mastoid effusion. CT CERVICAL SPINE FINDINGS Alignment: Reversal of the expected cervical lordosis. Trace C3-C4 and C4-C5 grade 1 anterolisthesis. Skull base and vertebrae: The basion-dental and atlanto-dental intervals are maintained.No evidence of acute fracture to the cervical spine. Soft tissues and spinal canal: No prevertebral fluid or swelling. No visible canal hematoma. Disc levels: Cervical spondylosis. Most notably at C6-C7, there is advanced disc space narrowing with shallow posterior disc osteophyte complex and uncovertebral hypertrophy. Upper chest: There is an acute, displaced fracture of the posterior left first rib (series 5, image 68). Nondisplaced acute fracture of the posterior left second rib (series 7, images 27 and 28). No consolidation within the imaged lung apices. No visible pneumothorax. IMPRESSION: CT head: 1. No evidence of acute intracranial abnormality. 2. Redemonstrated chronic cortically based infarcts within the right frontal, parietal and occipital lobes. 3. Stable chronic small vessel ischemic changes within the cerebral white matter. Unchanged chronic right cerebellar lacunar infarct. 4. Stable mild  generalized parenchymal atrophy. 5. Mild ethmoid sinus  mucosal thickening. CT cervical spine: 1. Acute, displaced fracture of the posterior left first rib. Nondisplaced acute fracture of the posterior left second rib. Consider a dedicated chest CT to assess for additional thoracic trauma. 2. No evidence of acute fracture to the cervical spine. 3. Cervical spondylosis, greatest at C6-C7. 4. Minimal C3-C4 and C4-C5 grade 1 anterolisthesis. Electronically Signed   By: Kellie Simmering DO   On: 07/29/2019 18:36   CT Cervical Spine Wo Contrast  Result Date: 07/29/2019 CLINICAL DATA:  Head trauma, headache. Spine fracture, cervical, traumatic. Additional history provided: Mechanical fall. EXAM: CT HEAD WITHOUT CONTRAST CT CERVICAL SPINE WITHOUT CONTRAST TECHNIQUE: Multidetector CT imaging of the head and cervical spine was performed following the standard protocol without intravenous contrast. Multiplanar CT image reconstructions of the cervical spine were also generated. COMPARISON:  Brain MRI 01/05/2019, head CT 01/05/2019, CT cervical spine 01/05/2019 FINDINGS: CT HEAD FINDINGS Brain: Redemonstrated chronic cortically based infarcts within the right frontal, parietal and occipital lobes. Unchanged chronic lacunar infarct within the right cerebellum. Ill-defined hypoattenuation within the cerebral white matter is nonspecific, but consistent with chronic small vessel ischemic disease. Stable, mild generalized parenchymal atrophy. There is no acute intracranial hemorrhage. No acute demarcated cortical infarct is demonstrated. No extra-axial fluid collection. No evidence of intracranial mass. No midline shift. Vascular: No hyperdense vessel.  Atherosclerotic calcifications. Skull: Normal. Negative for fracture or focal lesion. Sinuses/Orbits: Visualized orbits show no acute finding. Mild ethmoid sinus mucosal thickening. No significant mastoid effusion. CT CERVICAL SPINE FINDINGS Alignment: Reversal of the expected  cervical lordosis. Trace C3-C4 and C4-C5 grade 1 anterolisthesis. Skull base and vertebrae: The basion-dental and atlanto-dental intervals are maintained.No evidence of acute fracture to the cervical spine. Soft tissues and spinal canal: No prevertebral fluid or swelling. No visible canal hematoma. Disc levels: Cervical spondylosis. Most notably at C6-C7, there is advanced disc space narrowing with shallow posterior disc osteophyte complex and uncovertebral hypertrophy. Upper chest: There is an acute, displaced fracture of the posterior left first rib (series 5, image 68). Nondisplaced acute fracture of the posterior left second rib (series 7, images 27 and 28). No consolidation within the imaged lung apices. No visible pneumothorax. IMPRESSION: CT head: 1. No evidence of acute intracranial abnormality. 2. Redemonstrated chronic cortically based infarcts within the right frontal, parietal and occipital lobes. 3. Stable chronic small vessel ischemic changes within the cerebral white matter. Unchanged chronic right cerebellar lacunar infarct. 4. Stable mild generalized parenchymal atrophy. 5. Mild ethmoid sinus mucosal thickening. CT cervical spine: 1. Acute, displaced fracture of the posterior left first rib. Nondisplaced acute fracture of the posterior left second rib. Consider a dedicated chest CT to assess for additional thoracic trauma. 2. No evidence of acute fracture to the cervical spine. 3. Cervical spondylosis, greatest at C6-C7. 4. Minimal C3-C4 and C4-C5 grade 1 anterolisthesis. Electronically Signed   By: Kellie Simmering DO   On: 07/29/2019 18:36   DG Chest Port 1 View  Result Date: 08/03/2019 CLINICAL DATA:  Tachypnea EXAM: PORTABLE CHEST 1 VIEW COMPARISON:  08/05/2019 FINDINGS: Single frontal view of the chest demonstrates postsurgical changes from median sternotomy and thoracic aortic stent. The most inferior segment of the thoracic aortic stent has changed orientation since prior study, and now  measures 2.9 cm across its widest aspect where previously it had measured only 2.2 cm. CT angiography is recommended to assess for Recurrent dissection or aneurysm. No airspace disease, effusion, or pneumothorax. No change in the cardiac silhouette. IMPRESSION: 1. Change in orientation and  positioning of the distal margin of the thoracic aortic stent graft. Findings are concerning for Recurrent dissection or aneurysm. CT angiography of the chest is recommended for further evaluation. These results were called by telephone at the time of interpretation on 07/26/2019 at 9:52 pm to provider ABIGAIL HARRIS , who verbally acknowledged these results. Electronically Signed   By: Randa Ngo M.D.   On: 07/22/2019 21:52   DG Chest Port 1 View  Result Date: 07/22/2019 CLINICAL DATA:  Chest tube removal. EXAM: PORTABLE CHEST 1 VIEW COMPARISON:  Jul 21, 2019. FINDINGS: Stable cardiomediastinal silhouette. Stent graft is unchanged in position. No pneumothorax is noted. Right lung is clear. Stable left basilar atelectasis and pleural effusion is noted. Bony thorax is unremarkable. IMPRESSION: Stable left basilar atelectasis and pleural effusion is noted. No pneumothorax is noted. Electronically Signed   By: Marijo Conception M.D.   On: 07/22/2019 08:21   DG Chest Port 1 View  Result Date: 07/21/2019 CLINICAL DATA:  Chest tube, history COPD, Alzheimer's, coronary artery disease, hypertension, stroke EXAM: PORTABLE CHEST 1 VIEW COMPARISON:  Portable exam 0549 hours compared to 07/20/2019 FINDINGS: Mediastinal drain and BILATERAL thoracostomy tubes present. Upper normal heart size post median sternotomy and aortic stenting. Pulmonary vascularity normal. LEFT pleural effusion and basilar atelectasis. Tiny LEFT pneumothorax lateral upper LEFT chest. Remaining lungs clear. Bones demineralized. IMPRESSION: Persistent LEFT pleural effusion and basilar atelectasis. Tiny LEFT pneumothorax despite thoracostomy tube. Electronically  Signed   By: Lavonia Dana M.D.   On: 07/21/2019 09:05   DG Chest Port 1 View  Result Date: 07/20/2019 CLINICAL DATA:  Pleural effusion. EXAM: PORTABLE CHEST 1 VIEW COMPARISON:  07/19/2019 FINDINGS: Stent graft unchanged over the descending thoracic aorta. Left-sided chest tube as well as mediastinal drain unchanged. Lungs are adequately inflated with stable small amount left pleural fluid/atelectasis. Right lung is clear. No pneumothorax. Cardiomediastinal silhouette and remainder of the exam is unchanged. IMPRESSION: 1. Stable left base opacification likely small effusion with atelectasis. 2.  Tubes and lines as described. Electronically Signed   By: Marin Olp M.D.   On: 07/20/2019 16:20   DG Shoulder Left  Result Date: 07/29/2019 CLINICAL DATA:  Recent fall with left shoulder pain, initial encounter EXAM: LEFT SHOULDER - 2+ VIEW COMPARISON:  None. FINDINGS: Degenerative changes of the acromioclavicular joint are noted. No acute fracture or dislocation is seen. Aortic stent graft is noted in the thoracic aorta. Old healed rib fractures on the right are noted as well. No acute fracture or dislocation is noted. IMPRESSION: No acute abnormality noted. Electronically Signed   By: Inez Catalina M.D.   On: 07/29/2019 17:45   DG Swallowing Func-Speech Pathology  Result Date: 07/22/2019 Objective Swallowing Evaluation: Type of Study: MBS-Modified Barium Swallow Study  Patient Details Name: Shaindy Reader MRN: [id number] Date of Birth: 06/21/1955 Today's Date: 07/22/2019 Time: SLP Start Time (ACUTE ONLY): 0940 -SLP Stop Time (ACUTE ONLY): 0955 SLP Time Calculation (min) (ACUTE ONLY): 15 min Past Medical History: Past Medical History: Diagnosis Date . Alzheimer disease ([place])  . Anxiety  . Bilateral pneumonia 01/2017  per new patient packet  . COPD (chronic obstructive pulmonary disease) ([place]) 01/2017  per new patient packet  . Coronary artery disease  . Depression  . Fatty liver  . Gallbladder disease 2014  per new  patient packet  . Hernia, abdominal  . Hypertension  . Liver damage 01/2017  per new patient packet, Dr.Kadakia . Normal cardiac stress test 2007 . Ovarian cyst 1977  8 1/2 lb left ovarian cyst, Dr.Cox, per new patient packet  . Pneumonia  . Right ovarian cyst   1992, 1993, 1994, 1995, and 1996 Dr.Neil, per new patient packet  . Stroke ([place])   4 strokes.  Past Surgical History: Past Surgical History: Procedure Laterality Date . ABDOMINAL HYSTERECTOMY Bilateral   Total . CESAREAN SECTION   . CHOLECYSTECTOMY   . INGUINAL HERNIA REPAIR Right  . LEFT OOPHORECTOMY  1977  with cyst removal, age 40 . MINOR EXCISION OF ORAL LESION N/A 10/07/2017  Procedure: IRRIGATION AND DEBRIDEMENT OF ORAL INFECTION, REMOVAL OF REMAINING 8 TEETH AND PLACEMENT OF PENROSE DRAINS;  Surgeon: Michael Litter, DMD;  Location: [facility];  Service: Oral Surgery;  Laterality: N/A; . OVARIAN CYST REMOVAL Right 93, 94,95, 96 . REPLACEMENT ASCENDING AORTA N/A 07/15/2019  Procedure: TOTAL AORTIC ARCH REPLACEMENT USING HEMASHIELD PLATINUM GRAFT SIZE 28MM;  Surgeon: Wonda Olds, MD;  Location: [facility];  Service: Open Heart Surgery;  Laterality: N/A; . TEE WITHOUT CARDIOVERSION N/A 07/15/2019  Procedure: TRANSESOPHAGEAL ECHOCARDIOGRAM (TEE);  Surgeon: Wonda Olds, MD;  Location: [facility];  Service: Open Heart Surgery;  Laterality: N/A; . THORACIC AORTIC ENDOVASCULAR STENT GRAFT N/A 05/11/2019  Procedure: THORACIC AORTIC ENDOVASCULAR STENT GRAFT, RETROPERITONEAL CONDUIT RIGHT ILIAC ARTERY;  Surgeon: Angelia Mould, MD;  Location: [facility];  Service: Vascular;  Laterality: N/A; . THORACIC AORTIC ENDOVASCULAR STENT GRAFT N/A 07/15/2019  Procedure: Thoracic Aortic Endovascular Repair of Distal Thoracic Pseudoaneurysm ( Stent Graft) - Using GORE TAG Conformable Thoracic Stent Graft - Size 26MM;  Surgeon: Wonda Olds, MD;  Location: [place];  Service: Open Heart Surgery;  Laterality: N/A; . UMBILICAL HERNIA REPAIR   HPI: Kharis Lapenna  64 y.o. 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 Risk of recurrent laryngeal nerven injury.  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 Pt is easily able to tolerate solids and nectar thick liquids without oral dysphagia (other than missing dentition) and no penetration. However, with thin liquids, pt is able to initiate laryngeal closure prior to swallow initiation, resulting in trace frank penetration events with movement of liquid just below glottis. Pt is able to eject with an occasional throat clear (cued), though this is still dysphonic and weak. When bolus size increased, quantity of penetration increased. Pt is intuitive at this point with a supraglottic swallow; minimal verbal cueing needed, though SLP did reinforce need to constantly attend to swallowing, keep rate slow and bolus small and remember to close her throat before the swallow. 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 Reported to [facility] team.  SLP Visit Diagnosis Dysphagia, pharyngeal phase (R13.13) Attention and concentration deficit following -- Frontal lobe and executive function deficit following -- Impact on safety and function Moderate aspiration risk   CHL IP TREATMENT RECOMMENDATION 07/22/2019 Treatment Recommendations Therapy as outlined in treatment plan below   Prognosis 07/22/2019 Prognosis for Safe Diet Advancement Good Barriers to Reach Goals -- Barriers/Prognosis Comment -- CHL IP DIET RECOMMENDATION 07/22/2019 SLP Diet Recommendations Nectar thick liquid;Dysphagia 1 (Puree) solids Liquid  Administration via Cup;Straw Medication Administration Crushed with puree Compensations Slow rate;Clear throat  intermittently;Effortful swallow Postural Changes Remain semi-upright after after feeds/meals (Comment);Seated upright at 90 degrees   CHL IP OTHER RECOMMENDATIONS 07/22/2019 Recommended Consults Consider ENT evaluation Oral Care Recommendations Oral care BID Other Recommendations --   CHL IP FOLLOW UP RECOMMENDATIONS 07/22/2019 Follow up Recommendations Home health SLP   CHL IP FREQUENCY AND DURATION 07/22/2019 Speech Therapy Frequency (ACUTE ONLY) min 2x/week Treatment Duration 2 weeks      CHL IP ORAL PHASE 07/22/2019 Oral Phase WFL Oral - Pudding Teaspoon -- Oral - Pudding Cup -- Oral - Honey Teaspoon -- Oral - Honey Cup -- Oral - Nectar Teaspoon -- Oral - Nectar Cup -- Oral - Nectar Straw -- Oral - Thin Teaspoon -- Oral - Thin Cup -- Oral - Thin Straw -- Oral - Puree -- Oral - Mech Soft -- Oral - Regular -- Oral - Multi-Consistency -- Oral - Pill -- Oral Phase - Comment --  CHL IP PHARYNGEAL PHASE 07/22/2019 Pharyngeal Phase Impaired Pharyngeal- Pudding Teaspoon -- Pharyngeal -- Pharyngeal- Pudding Cup -- Pharyngeal -- Pharyngeal- Honey Teaspoon -- Pharyngeal -- Pharyngeal- Honey Cup -- Pharyngeal -- Pharyngeal- Nectar Teaspoon NT Pharyngeal -- Pharyngeal- Nectar Cup WFL Pharyngeal -- Pharyngeal- Nectar Straw NT Pharyngeal -- Pharyngeal- Thin Teaspoon NT Pharyngeal -- Pharyngeal- Thin Cup Reduced airway/laryngeal closure;Penetration/Aspiration during swallow Pharyngeal Material enters airway, passes BELOW cords without attempt by patient to eject out (silent aspiration);Material enters airway, CONTACTS cords and not ejected out;Material enters airway, remains ABOVE vocal cords then ejected out;Material does not enter airway Pharyngeal- Thin Straw Reduced airway/laryngeal closure;Penetration/Aspiration during swallow Pharyngeal Material enters airway, passes BELOW cords without attempt by patient to eject out  (silent aspiration);Material enters airway, CONTACTS cords and not ejected out;Material enters airway, remains ABOVE vocal cords then ejected out;Material does not enter airway Pharyngeal- Puree WFL Pharyngeal -- Pharyngeal- Mechanical Soft WFL Pharyngeal -- Pharyngeal- Regular -- Pharyngeal -- Pharyngeal- Multi-consistency -- Pharyngeal -- Pharyngeal- Pill -- Pharyngeal -- Pharyngeal Comment --  No flowsheet data found. Herbie Baltimore, MA CCC-SLP Acute Rehabilitation Services Pager [phone] Office [phone] Lynann Beaver 07/22/2019, 10:38 AM              CT Angio Chest/Abd/Pel for Dissection W and/or Wo Contrast  Result Date: 08/02/2019 CLINICAL DATA:  Right flank pain and interscapular pain for 2 days, nausea, history of thoracic aortic aneurysm status post stent graft repair EXAM: CT ANGIOGRAPHY CHEST, ABDOMEN AND PELVIS TECHNIQUE: Non-contrast CT of the chest was initially obtained. Multidetector CT imaging through the chest, abdomen and pelvis was performed using the standard protocol during bolus administration of intravenous contrast. Multiplanar reconstructed images and MIPs were obtained and reviewed to evaluate the vascular anatomy. CONTRAST:  81m OMNIPAQUE IOHEXOL 350 MG/ML SOLN COMPARISON:  05/11/2019, 05/28/2019, 07/11/2019, 07/14/2019 FINDINGS: CTA CHEST FINDINGS Cardiovascular: The endoluminal stent graft within the descending thoracic aorta is again identified. Since the previous exam, significant fluid has developed in the aneurysm sac surrounding the distal margin of the stent graft. Outer dimension of the aneurysm sac measure up to 6.9 x 4.0 cm reference image 86. There is significant mass effect upon the distal esophagus by the enlarging aneurysm sac. In addition, numerous foci of gas are seen within the aneurysm sac outside the confines of the aortic stent graft. There may be communication between the distal esophagus and the aneurysm sac, reference axial images 59 and 60 of  series 7. There is no evidence of aneurysm rupture or dissection on this exam. No pericardial effusion. Mediastinum/Nodes: Please see discussion above regarding  possible communication between the aneurysm sac and the distal thoracic esophagus. The trachea and thyroid are unremarkable. No pathologic adenopathy. Lungs/Pleura: There is background emphysema. Trace bilateral pleural effusions. No airspace disease or pneumothorax. Musculoskeletal: There are no acute displaced fractures. Subacute posterior left first and second rib fractures are again noted. Reconstructed images demonstrate no additional findings. Review of the MIP images confirms the above findings. CTA ABDOMEN AND PELVIS FINDINGS VASCULAR Aorta: Irregular atheromatous plaque is seen throughout the abdominal aorta, stable. Celiac: Patent without evidence of aneurysm, dissection, vasculitis or significant stenosis. SMA: Patent without evidence of aneurysm, dissection, vasculitis or significant stenosis. Renals: Both renal arteries are patent without evidence of aneurysm, dissection, vasculitis, fibromuscular dysplasia or significant stenosis. IMA: Patent without evidence of aneurysm, dissection, vasculitis or significant stenosis. Inflow: Significant atherosclerosis of the bilateral common iliac arteries, left greater than right. Estimated 50% stenosis at the left iliac bifurcation. Veins: No obvious venous abnormality within the limitations of this arterial phase study. Review of the MIP images confirms the above findings. NON-VASCULAR Hepatobiliary: No focal liver abnormality is seen. Status post cholecystectomy. No biliary dilatation. Pancreas: Unremarkable. No pancreatic ductal dilatation or surrounding inflammatory changes. Spleen: Normal in size without focal abnormality. Adrenals/Urinary Tract: Adrenal glands are unremarkable. Kidneys are normal, without renal calculi, focal lesion, or hydronephrosis. Bladder is unremarkable. Stomach/Bowel: No bowel  obstruction or ileus. No bowel wall thickening or inflammatory change. Lymphatic: No pathologic adenopathy. Reproductive: Status post hysterectomy. No adnexal masses. Other: No free fluid or free gas. Musculoskeletal: No acute or destructive bony lesions. Review of the MIP images confirms the above findings. IMPRESSION: 1. Abnormal appearance of the distal thoracic aorta at the site of prior endoluminal stent graft repair. There is increased fluid within the excluded aneurysm sac, which has enlarged in the interim. Gas is now identified within this excluded portion of the aneurysm outside the stent graft, consistent with infection. 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 2. No contrast extravasation to suggest active bleeding or aneurysm rupture. No evidence of dissection. 3. Trace bilateral pleural effusions. 4. Stable atherosclerosis throughout the abdominal aorta. No acute intra-abdominal or intrapelvic process. These results were called by telephone at the time of interpretation on 07/31/2019 at 10:34 pm to provider ABIGAIL HARRIS , who verbally acknowledged these results. Electronically Signed   By: Randa Ngo M.D.   On: 08/10/2019 22:34    Microbiology Recent Results (from the past 240 hour(s))  SARS Coronavirus 2 by RT PCR (hospital order, performed in [facility] hospital lab) Nasopharyngeal Nasopharyngeal Swab     Status: None   Collection Time: 08/02/2019 10:22 PM   Specimen: Nasopharyngeal Swab  Result Value Ref Range Status   SARS Coronavirus 2 NEGATIVE NEGATIVE Final    Comment: (NOTE) SARS-CoV-2 target nucleic acids are NOT DETECTED. The SARS-CoV-2 RNA is generally detectable in upper and lower respiratory specimens during the acute phase of infection. The lowest concentration of SARS-CoV-2 viral copies this assay can detect is 250 copies / mL. A negative result does not preclude SARS-CoV-2 infection and should not be  used as the sole basis for treatment or other patient management decisions.  A negative result may occur with improper specimen collection / handling, submission of specimen other than nasopharyngeal swab, presence of viral mutation(s) within the areas targeted by this assay, and inadequate number of viral copies (<250 copies / mL). A negative result must be combined with clinical observations, patient history, and  epidemiological information. Fact Sheet for Patients:   StrictlyIdeas.no Fact Sheet for Healthcare Providers: BankingDealers.co.za This test is not yet approved or cleared  by the [place] FDA and has been authorized for detection and/or diagnosis of SARS-CoV-2 by FDA under an Emergency Use Authorization (EUA).  This EUA will remain in effect (meaning this test can be used) for the duration of the COVID-19 declaration under Section 564(b)(1) of the Act, 21 U.S.C. section 360bbb-3(b)(1), unless the authorization is terminated or revoked sooner. Performed at [facility], [place] [address], [place], [place] [address]   Blood culture (routine x 2)     Status: Abnormal   Collection Time: 08/12/2019 11:03 PM   Specimen: BLOOD RIGHT ARM  Result Value Ref Range Status   Specimen Description BLOOD RIGHT ARM  Final   Special Requests   Final    BOTTLES DRAWN AEROBIC AND ANAEROBIC Blood Culture adequate volume   Culture  Setup Time   Final    CORRECTED RESULTS GRAM POSITIVE RODS CRITICAL RESULT CALLED TO, READ BACK BY AND VERIFIED WITH: K AMEND PHARMD 08/15/19 0355  JDW PREVIOUSLY REPORTED AS: GRAM POSITIVE COCCI IN CHAINS CORRECTED RESULTS CALLED TO: PHARMD T BAUMEISTER [id number] AT 36 AM BHY CM    Culture (A)  Final    DIPHTHEROIDS(CORYNEBACTERIUM SPECIES) CORRECTED ON 05/28 AT 0909: PREVIOUSLY REPORTED AS NO GROWTH 5 DAYS Standardized susceptibility testing for this organism is not available. Performed at [facility], [place] [address] [place], [place] [address]    Report Status Sep 07, 2019 FINAL  Final  Blood Culture ID Panel (Reflexed)     Status: None   Collection Time: 07/29/2019 11:03 PM  Result Value Ref Range Status   Enterococcus species NOT DETECTED NOT DETECTED Final   Listeria monocytogenes NOT DETECTED NOT DETECTED Final   Staphylococcus species NOT DETECTED NOT DETECTED Final   Staphylococcus aureus (BCID) NOT DETECTED NOT DETECTED Final   Streptococcus species NOT DETECTED NOT DETECTED Final   Streptococcus agalactiae NOT DETECTED NOT DETECTED Final   Streptococcus pneumoniae NOT DETECTED NOT DETECTED Final   Streptococcus pyogenes NOT DETECTED NOT DETECTED Final   Acinetobacter baumannii NOT DETECTED NOT DETECTED Final   Enterobacteriaceae species NOT DETECTED NOT DETECTED Final   Enterobacter cloacae complex NOT DETECTED NOT DETECTED Final   Escherichia coli NOT DETECTED NOT DETECTED Final   Klebsiella oxytoca NOT DETECTED NOT DETECTED Final   Klebsiella pneumoniae NOT DETECTED NOT DETECTED Final   Proteus species NOT DETECTED NOT DETECTED Final   Serratia marcescens NOT DETECTED NOT DETECTED Final   Haemophilus influenzae NOT DETECTED NOT DETECTED Final   Neisseria meningitidis NOT DETECTED NOT DETECTED Final   Pseudomonas aeruginosa NOT DETECTED NOT DETECTED Final   Candida albicans NOT DETECTED NOT DETECTED Final   Candida glabrata NOT DETECTED NOT DETECTED Final   Candida krusei NOT DETECTED NOT DETECTED Final   Candida parapsilosis NOT DETECTED NOT DETECTED Final   Candida tropicalis NOT DETECTED NOT DETECTED Final    Comment: Performed at [facility], [place] [address], [place],  [address]  MRSA PCR Screening     Status: None   Collection Time: 08/12/19  1:59 AM   Specimen: Nasopharyngeal  Result Value Ref Range Status   MRSA by PCR NEGATIVE NEGATIVE Final    Comment:        The GeneXpert MRSA Assay (FDA approved for NASAL specimens only), is one component of  a comprehensive MRSA colonization surveillance program. It is not  intended to diagnose MRSA infection nor to guide or monitor treatment for MRSA infections. Performed at [facility], [place] [address] [place], [place] [address]   Blood culture (routine x 2)     Status: None   Collection Time: 08/12/19  6:17 AM   Specimen: BLOOD RIGHT ARM  Result Value Ref Range Status   Specimen Description BLOOD RIGHT ARM  Final   Special Requests AEROBIC BOTTLE ONLY Blood Culture adequate volume  Final   Culture   Final    NO GROWTH 5 DAYS Performed at [facility], [address] [place], [place] [address]    Report Status 08/17/2019 FINAL  Final    Lab Basic Metabolic Panel: Recent Labs  Lab 08/12/19 0617 08/12/19 1642 08/12/19 2010 08/12/19 2239 08/13/19 0212 08/13/19 0627 08/13/19 1310 08/14/19 0526 08/15/19 0722  NA 132*  --  136  --  135  --   --  136 136  K 2.8*   < > 5.3*   < > 4.2 3.6 3.9 3.7 3.4*  CL 99  --  102  --  99  --   --  104 105  CO2 20*  --  24  --  23  --   --  21* 21*  GLUCOSE 128*  --  84  --  46*  --   --  119* 125*  BUN 10  --  13  --  12  --   --  16 12  CREATININE 0.63  --  0.60  --  0.56  --   --  0.55 0.56  CALCIUM 8.1*  --  8.9  --  9.0  --   --  8.4* 8.1*  MG  --   --   --   --   --   --   --  1.8 1.5*   < > = values in this interval not displayed.   Liver Function Tests: No results for input(s): AST, ALT, ALKPHOS, BILITOT, PROT, ALBUMIN in the last 168 hours. No results for input(s): LIPASE, AMYLASE in the last 168 hours. No results for input(s): AMMONIA in the last 168 hours. CBC: Recent Labs  Lab 08/12/19 0617 08/13/19 0212 08/14/19 0526 08/15/19 0722  WBC 24.6* 22.6* 16.7* 15.9*  NEUTROABS 22.7*  --   --   --   HGB 9.1* 8.9* 9.7* 8.7*  HCT 28.3* 28.1* 31.3* 28.5*  MCV 79.7* 81.4 81.1 82.1  PLT 414* 400 457* 452*   Cardiac Enzymes: No results for input(s): CKTOTAL, CKMB, CKMBINDEX, TROPONINI in the last 168 hours. Sepsis  Labs: Recent Labs  Lab 07/28/2019 2308 08/12/19 0617 08/13/19 0212 08/14/19 0526 08/15/19 0722  WBC  --  24.6* 22.6* 16.7* 15.9*  LATICACIDVEN 1.3  --   --   --   --     Procedures/Operations  None   Adriauna Campton T Carie Kapuscinski 2019/08/26, 9:29 PM

## 2019-08-20 NOTE — Progress Notes (Signed)
Pt noted to have no pulse no breatthing at 6:43 am, pronounced time of death by this nurse and Trudee Grip RN, [place] donor called pt is not suitable for organ donation, Mrs. Valentina Lucks, pt's sister was called to inform, they dont have funeral information yet, incoming nurse made aware.

## 2019-08-20 NOTE — Progress Notes (Signed)
PHARMACY - PHYSICIAN COMMUNICATION CRITICAL VALUE ALERT - BLOOD CULTURE IDENTIFICATION (BCID)  Brittany [name] is an 63 y.o. female who presented to [facility] on 08/09/2019 with a chief complaint of back and abdominal pain   Assessment:  Received a call back from micro that the original read on the gram stain needed correcting. It was originally read as GPC in chains but it speciated to corynebacterium species which is a GPR. This was considered a contaminant on the 27th since it was only in 1/3 bottles, corynebacterium is likely a contaminant as well so no changes are needed  Name of physician (or Provider) Contacted: Dr. Alanda Slim  Current antibiotics: None   Changes to prescribed antibiotics recommended:  Recommendations accepted by provider  No changes needed   Results for orders placed or performed during the hospital encounter of 07/25/2019  Blood Culture ID Panel (Reflexed) (Collected: 08/16/2019 11:03 PM)  Result Value Ref Range   Enterococcus species NOT DETECTED NOT DETECTED   Listeria monocytogenes NOT DETECTED NOT DETECTED   Staphylococcus species NOT DETECTED NOT DETECTED   Staphylococcus aureus (BCID) NOT DETECTED NOT DETECTED   Streptococcus species NOT DETECTED NOT DETECTED   Streptococcus agalactiae NOT DETECTED NOT DETECTED   Streptococcus pneumoniae NOT DETECTED NOT DETECTED   Streptococcus pyogenes NOT DETECTED NOT DETECTED   Acinetobacter baumannii NOT DETECTED NOT DETECTED   Enterobacteriaceae species NOT DETECTED NOT DETECTED   Enterobacter cloacae complex NOT DETECTED NOT DETECTED   Escherichia coli NOT DETECTED NOT DETECTED   Klebsiella oxytoca NOT DETECTED NOT DETECTED   Klebsiella pneumoniae NOT DETECTED NOT DETECTED   Proteus species NOT DETECTED NOT DETECTED   Serratia marcescens NOT DETECTED NOT DETECTED   Haemophilus influenzae NOT DETECTED NOT DETECTED   Neisseria meningitidis NOT DETECTED NOT DETECTED   Pseudomonas aeruginosa NOT DETECTED NOT DETECTED   Candida albicans NOT DETECTED NOT DETECTED   Candida glabrata NOT DETECTED NOT DETECTED   Candida krusei NOT DETECTED NOT DETECTED   Candida parapsilosis NOT DETECTED NOT DETECTED   Candida tropicalis NOT DETECTED NOT DETECTED    Jeannetta Nap 09/11/2019  7:42 AM

## 2019-08-20 NOTE — Progress Notes (Signed)
Sister here, Dr. Alanda Slim here to speak with family. Family is undecided about funeral home arrangements at this time. Will give them card to contact pt placement once this has been decided.

## 2019-08-20 DEATH — deceased

## 2019-08-26 ENCOUNTER — Ambulatory Visit: Payer: Medicare HMO | Admitting: Cardiothoracic Surgery

## 2019-08-27 NOTE — Congregational Nurse Program (Signed)
  Dept: [phone]   Congregational Nurse Program Note  Date of Encounter: 04/05/2017  Past Medical History: Past Medical History:  Diagnosis Date  . Alzheimer disease (HCC)   . Anxiety   . Bilateral pneumonia 01/2017   per new patient packet   . COPD (chronic obstructive pulmonary disease) (HCC) 01/2017   per new patient packet   . Coronary artery disease   . Depression   . Fatty liver   . Gallbladder disease 2014   per new patient packet   . Hernia, abdominal   . Hypertension   . Liver damage 01/2017   per new patient packet, Dr.Kadakia  . Normal cardiac stress test 2007  . Ovarian cyst 1977   8 1/2 lb left ovarian cyst, Dr.Cox, per new patient packet   . Pneumonia   . Right ovarian cyst    1992, 1993, 1994, 1995, and 1996 Dr.Neil, per new patient packet   . Stroke (HCC)    4 strokes.     Encounter Details:

## 2019-09-11 ENCOUNTER — Ambulatory Visit: Admit: 2019-09-11 | Payer: Medicare HMO | Admitting: Otolaryngology

## 2019-09-11 SURGERY — MICROLARYNGOSCOPY, WITH VOCAL CORD INJECTION
Anesthesia: General | Laterality: Left

## 2021-06-30 IMAGING — MR MR MRA NECK W/O CM
1 of 3 series · 39 of 48 positions shown · non-contrast
Comparison: MRI yesterday.

CLINICAL DATA: Follow-up stroke. Small left occipital acute
infarction.

EXAM:
MRA NECK WITHOUT  CONTRAST
MRA HEAD WITHOUT CONTRAST
TECHNIQUE: Multiplanar and multiecho pulse sequences of the neck were obtained
without intravenous contrast. Angiographic images of the neck were
obtained using MRA technique without and with intravenous contast.;
Angiographic images of the Circle of Willis were obtained using MRA
technique without intravenous contrast.

[Series 10: [id number] · axial · 0.6mm · 0.52mm/px · z∈[-331,-94]mm · 39 of 425 slices shown]
[im 1/425]
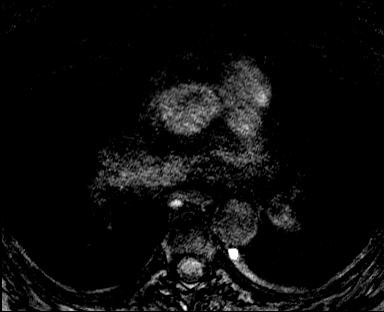
[im 10/425]
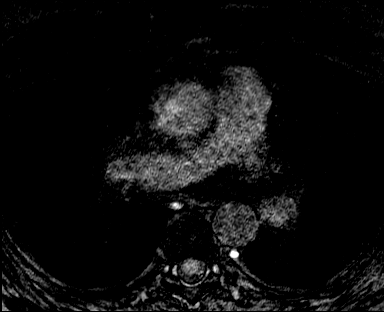
[im 19/425]
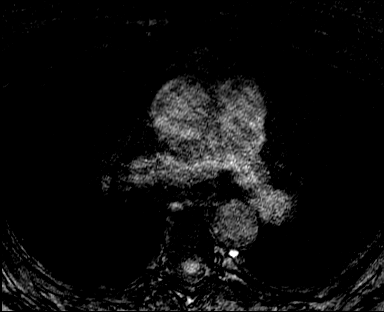
[im 29/425]
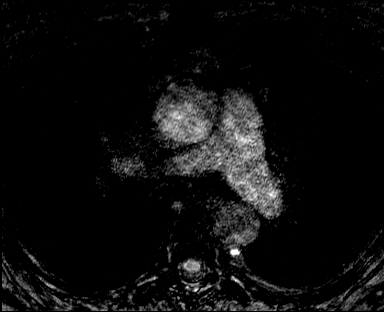
[im 38/425]
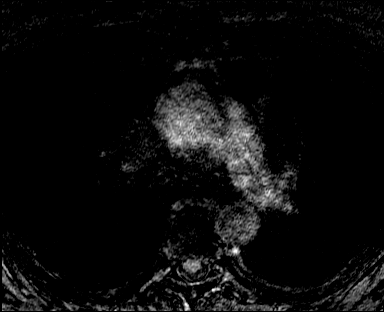
[im 48/425]
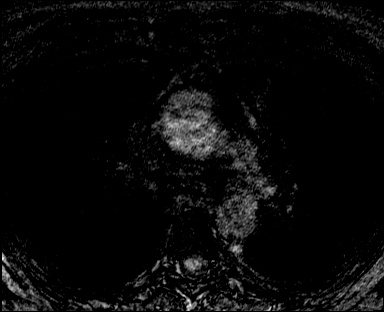
[im 57/425]
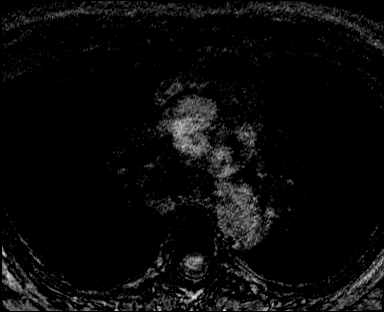
[im 66/425]
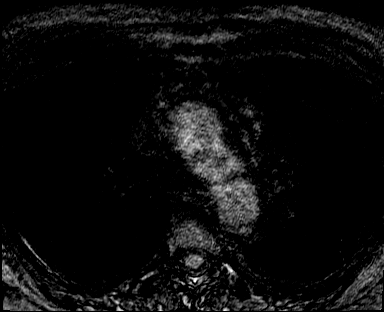
[im 76/425]
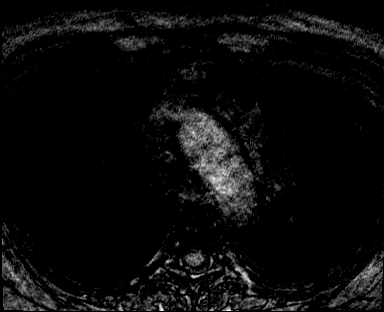
[im 85/425]
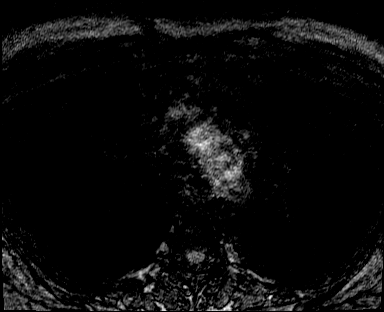
[im 95/425]
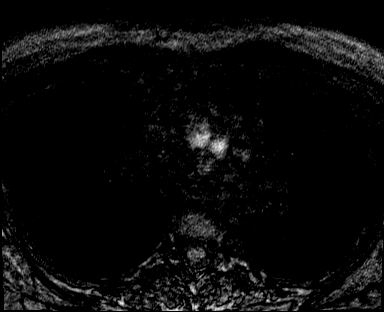
[im 104/425]
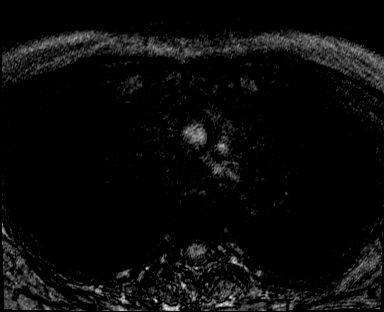
[im 114/425]
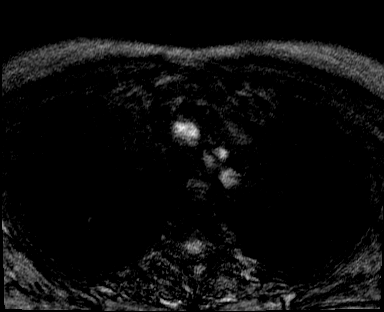
[im 123/425]
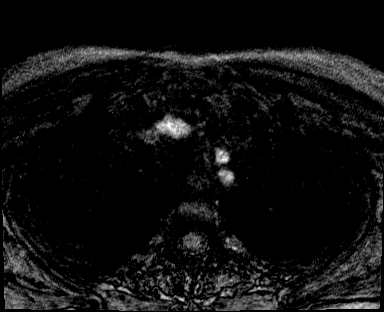
[im 132/425]
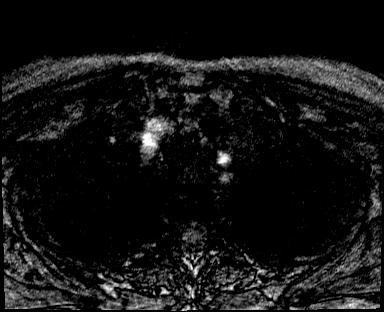
[im 142/425]
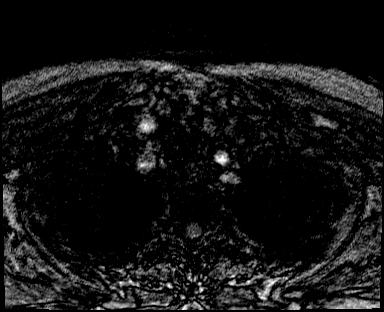
[im 151/425]
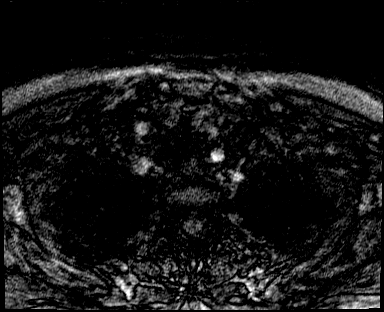
[im 161/425]
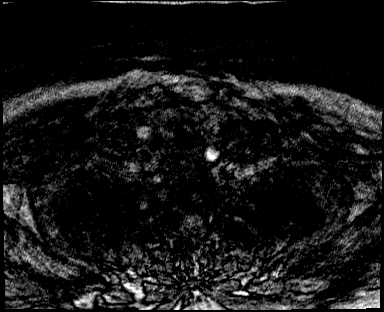
[im 170/425]
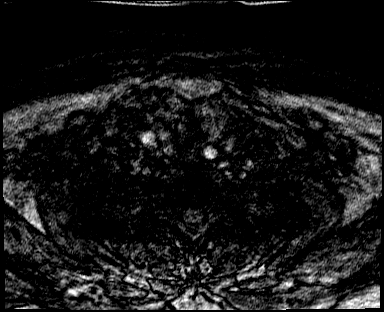
[im 180/425]
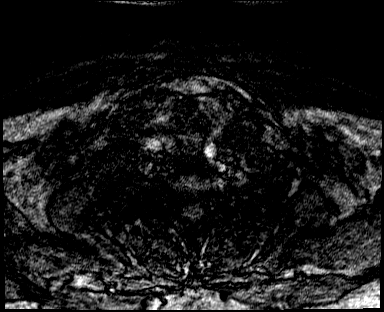
[im 189/425]
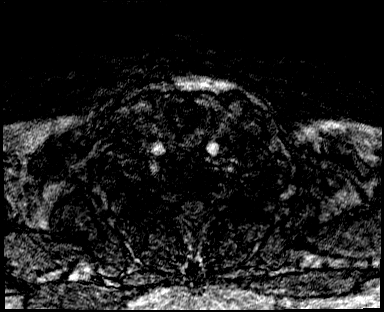
[im 198/425]
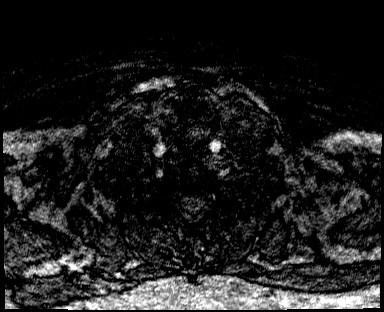
[im 208/425]
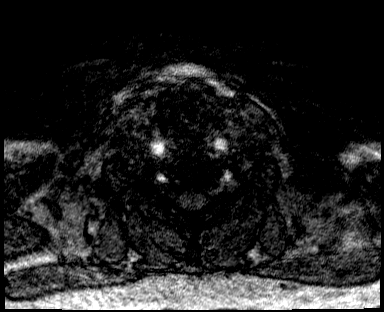
[im 217/425]
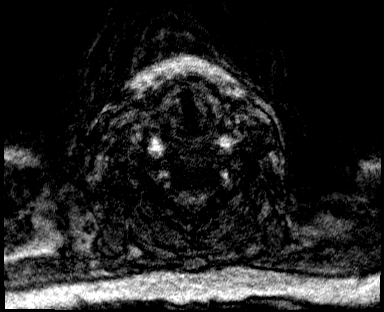
[im 227/425]
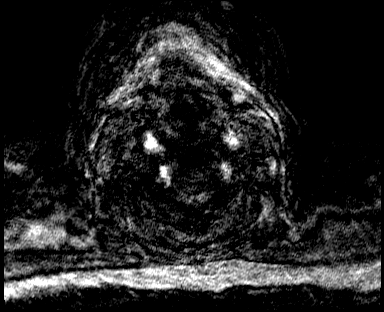
[im 236/425]
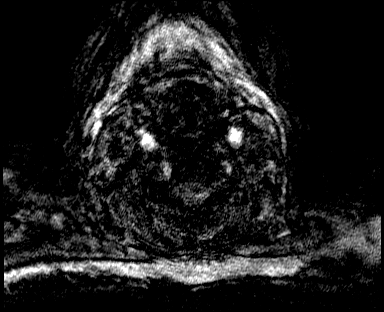
[im 245/425]
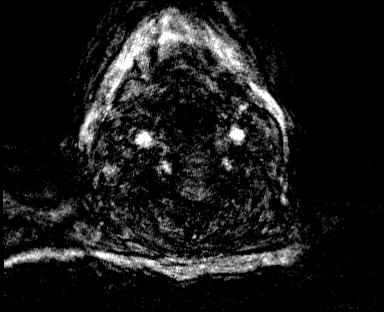
[im 255/425]
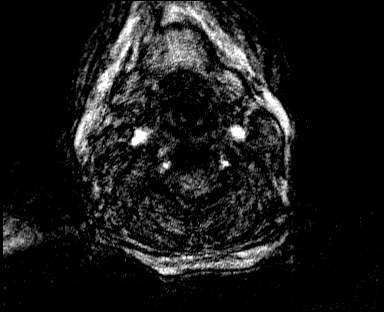
[im 264/425]
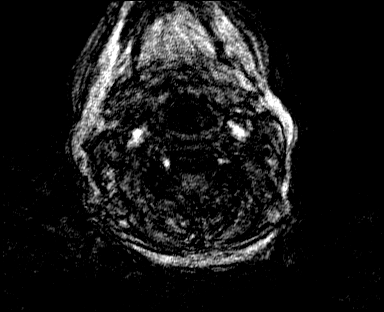
[im 274/425]
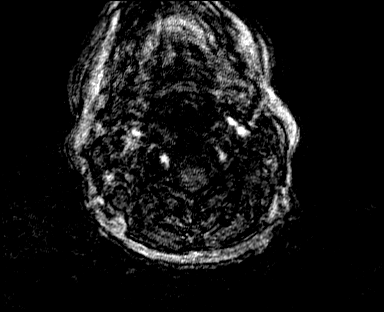
[im 283/425]
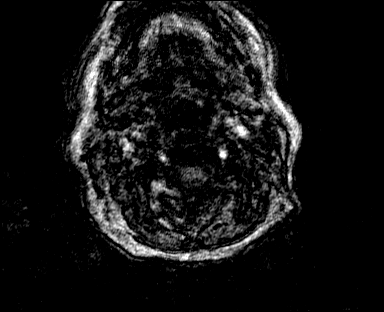
[im 293/425]
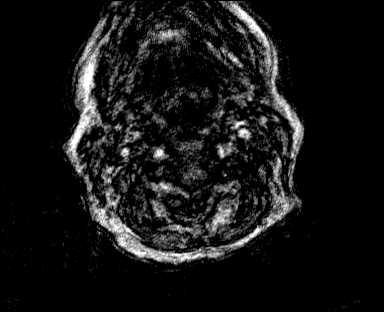
[im 302/425]
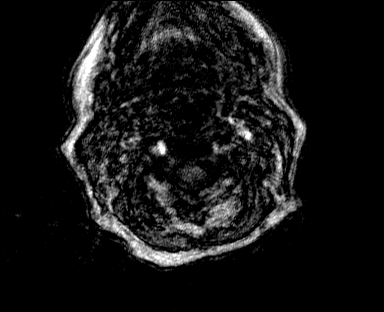
[im 311/425]
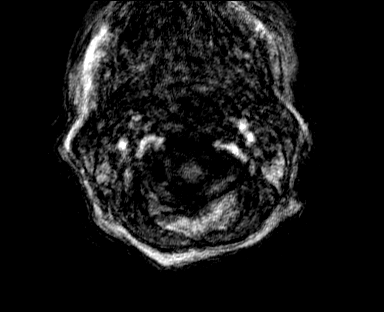
[im 321/425]
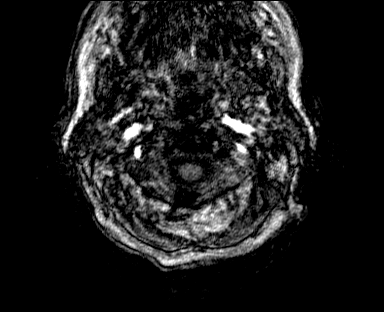
[im 330/425]
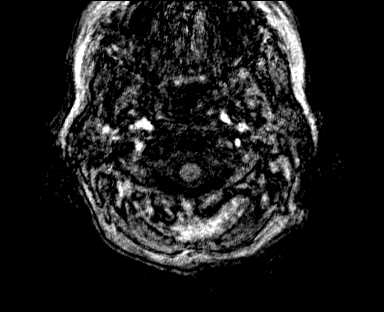
[im 349/425]
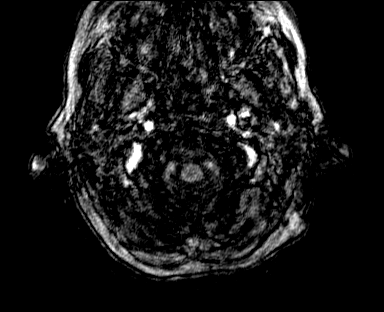
[im 359/425]
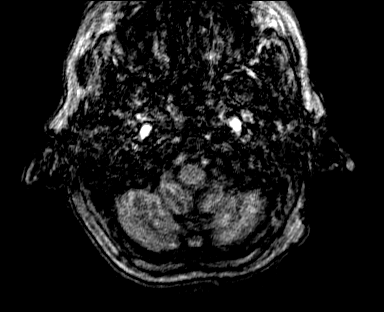
[im 406/425]
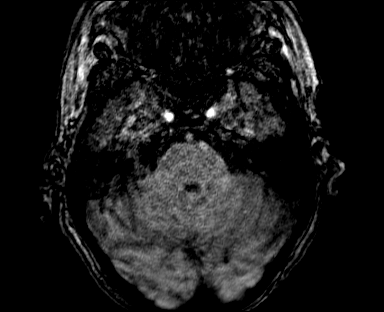

[39 of 48 positions shown; findings below may reference images not displayed]

FINDINGS: MRA NECK FINDINGS

Noncontrast neck study suffers from motion degradation. There is
antegrade flow in both common carotid arteries. Detail at the
bifurcation regions is quite limited. I cannot make any accurate
comment regarding stenosis. Antegrade flow is present in both
internal carotid arteries. Antegrade flow is present in both
vertebral arteries.

MRA HEAD FINDINGS

Considerable motion degradation. Antegrade flow is present in both
internal carotid arteries through the skull base. The anterior and
middle cerebral vessels show flow. No obvious stenosis.

Both vertebral arteries are patent to the basilar. Allowing for
artifact, no basilar disease is suspected. Flow is present in both
PCA vessels. There is diminished visualization of peripheral left
PCA branches compared to the right, but detail is quite limited.
IMPRESSION: Severely motion degraded studies. Antegrade flow present in the
major vessels in the neck. Cannot make any accurate statement about
carotid bifurcation stenosis.

No intracranial large or medium vessel occlusion identified.
Severely motion degraded study. There may be relatively diminished
visualization of distal left PCA branches compared to the right,
consistent with the small infarction in that region.
